# Patient Record
Sex: Female | Born: 1955 | Race: White | Hispanic: No | Marital: Married | Smoking: Never smoker
Health system: Southern US, Community
[De-identification: ages and names within clinical notes are randomized; demographics above are authoritative.]

## PROBLEM LIST (undated history)

## (undated) DIAGNOSIS — T7840XA Allergy, unspecified, initial encounter: Secondary | ICD-10-CM

## (undated) DIAGNOSIS — E785 Hyperlipidemia, unspecified: Secondary | ICD-10-CM

## (undated) DIAGNOSIS — R200 Anesthesia of skin: Secondary | ICD-10-CM

## (undated) DIAGNOSIS — Z9229 Personal history of other drug therapy: Secondary | ICD-10-CM

## (undated) DIAGNOSIS — I10 Essential (primary) hypertension: Secondary | ICD-10-CM

## (undated) DIAGNOSIS — E039 Hypothyroidism, unspecified: Secondary | ICD-10-CM

## (undated) DIAGNOSIS — J45909 Unspecified asthma, uncomplicated: Secondary | ICD-10-CM

## (undated) DIAGNOSIS — K219 Gastro-esophageal reflux disease without esophagitis: Secondary | ICD-10-CM

## (undated) DIAGNOSIS — U071 COVID-19: Secondary | ICD-10-CM

## (undated) DIAGNOSIS — R202 Paresthesia of skin: Secondary | ICD-10-CM

## (undated) DIAGNOSIS — L509 Urticaria, unspecified: Secondary | ICD-10-CM

## (undated) DIAGNOSIS — G43909 Migraine, unspecified, not intractable, without status migrainosus: Secondary | ICD-10-CM

## (undated) DIAGNOSIS — M1990 Unspecified osteoarthritis, unspecified site: Secondary | ICD-10-CM

## (undated) DIAGNOSIS — F32A Depression, unspecified: Secondary | ICD-10-CM

## (undated) DIAGNOSIS — Z87442 Personal history of urinary calculi: Secondary | ICD-10-CM

## (undated) HISTORY — DX: Unspecified osteoarthritis, unspecified site: M19.90

## (undated) HISTORY — PX: FRACTURE SURGERY: SHX138

## (undated) HISTORY — DX: Gastro-esophageal reflux disease without esophagitis: K21.9

## (undated) HISTORY — PX: TONSILLECTOMY AND ADENOIDECTOMY: SHX28

## (undated) HISTORY — DX: COVID-19: U07.1

## (undated) HISTORY — PX: ADENOIDECTOMY: SUR15

## (undated) HISTORY — DX: Essential (primary) hypertension: I10

## (undated) HISTORY — PX: TONSILLECTOMY: SUR1361

## (undated) HISTORY — DX: Urticaria, unspecified: L50.9

## (undated) HISTORY — DX: Depression, unspecified: F32.A

## (undated) HISTORY — PX: ARM NEUROPLASTY: SHX1184

## (undated) HISTORY — DX: Unspecified asthma, uncomplicated: J45.909

## (undated) HISTORY — DX: Hyperlipidemia, unspecified: E78.5

## (undated) HISTORY — DX: Allergy, unspecified, initial encounter: T78.40XA

---
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
## 1986-10-26 HISTORY — PX: TUBAL LIGATION: SHX77

## 2004-09-24 ENCOUNTER — Ambulatory Visit: Payer: Self-pay | Admitting: Otolaryngology

## 2004-12-23 ENCOUNTER — Ambulatory Visit: Payer: Self-pay | Admitting: Otolaryngology

## 2005-07-15 ENCOUNTER — Ambulatory Visit: Payer: Self-pay

## 2005-07-29 ENCOUNTER — Ambulatory Visit: Payer: Self-pay | Admitting: Unknown Physician Specialty

## 2006-07-28 ENCOUNTER — Ambulatory Visit: Payer: Self-pay

## 2006-07-30 ENCOUNTER — Ambulatory Visit: Payer: Self-pay

## 2006-08-27 ENCOUNTER — Encounter: Payer: Self-pay | Admitting: General Practice

## 2006-09-25 ENCOUNTER — Encounter: Payer: Self-pay | Admitting: General Practice

## 2006-10-26 ENCOUNTER — Encounter: Payer: Self-pay | Admitting: General Practice

## 2006-11-26 ENCOUNTER — Encounter: Payer: Self-pay | Admitting: General Practice

## 2007-06-09 ENCOUNTER — Ambulatory Visit: Payer: Self-pay

## 2007-09-14 ENCOUNTER — Ambulatory Visit: Payer: Self-pay | Admitting: Unknown Physician Specialty

## 2008-11-01 ENCOUNTER — Ambulatory Visit: Payer: Self-pay | Admitting: Unknown Physician Specialty

## 2009-08-19 ENCOUNTER — Other Ambulatory Visit: Payer: Self-pay

## 2009-09-12 ENCOUNTER — Other Ambulatory Visit: Payer: Self-pay

## 2009-10-29 ENCOUNTER — Other Ambulatory Visit: Payer: Self-pay

## 2009-11-04 ENCOUNTER — Ambulatory Visit: Payer: Self-pay | Admitting: Unknown Physician Specialty

## 2009-11-08 ENCOUNTER — Ambulatory Visit: Payer: Self-pay | Admitting: Unknown Physician Specialty

## 2009-12-10 ENCOUNTER — Ambulatory Visit: Payer: Self-pay

## 2009-12-17 ENCOUNTER — Other Ambulatory Visit: Payer: Self-pay

## 2010-07-16 ENCOUNTER — Ambulatory Visit: Payer: Self-pay | Admitting: Unknown Physician Specialty

## 2010-11-13 ENCOUNTER — Ambulatory Visit: Payer: Self-pay | Admitting: Unknown Physician Specialty

## 2011-01-06 ENCOUNTER — Other Ambulatory Visit: Payer: Self-pay | Admitting: Family Medicine

## 2011-04-20 ENCOUNTER — Other Ambulatory Visit: Payer: Self-pay | Admitting: Family Medicine

## 2011-09-01 ENCOUNTER — Other Ambulatory Visit: Payer: Self-pay | Admitting: Family Medicine

## 2011-11-29 ENCOUNTER — Emergency Department: Payer: Self-pay | Admitting: Emergency Medicine

## 2011-11-29 LAB — URINALYSIS, COMPLETE
Bacteria: NONE SEEN
Bilirubin,UR: NEGATIVE
Glucose,UR: NEGATIVE mg/dL (ref 0–75)
Ketone: NEGATIVE
Leukocyte Esterase: NEGATIVE
Nitrite: NEGATIVE
Ph: 5 (ref 4.5–8.0)
Protein: 30
RBC,UR: 27 /HPF (ref 0–5)
Specific Gravity: 1.029 (ref 1.003–1.030)
Squamous Epithelial: <1
WBC UR: 3 /HPF (ref 0–5)

## 2011-11-29 LAB — BASIC METABOLIC PANEL
Anion Gap: 6 — ABNORMAL LOW (ref 7–16)
BUN: 15 mg/dL (ref 7–18)
Calcium, Total: 8.4 mg/dL — ABNORMAL LOW (ref 8.5–10.1)
Chloride: 105 mmol/L (ref 98–107)
Co2: 28 mmol/L (ref 21–32)
Creatinine: 1.1 mg/dL (ref 0.60–1.30)
EGFR (African American): >60
EGFR (Non-African Amer.): 55 — ABNORMAL LOW
Glucose: 116 mg/dL — ABNORMAL HIGH (ref 65–99)
Osmolality: 279 (ref 275–301)
Potassium: 3.6 mmol/L (ref 3.5–5.1)
Sodium: 139 mmol/L (ref 136–145)

## 2011-11-29 LAB — CBC
HCT: 39.3 % (ref 35.0–47.0)
HGB: 13 g/dL (ref 12.0–16.0)
MCH: 30.4 pg (ref 26.0–34.0)
MCHC: 33.1 g/dL (ref 32.0–36.0)
MCV: 92 fL (ref 80–100)
Platelet: 177 10*3/uL (ref 150–440)
RBC: 4.27 10*6/uL (ref 3.80–5.20)
RDW: 13.2 % (ref 11.5–14.5)
WBC: 11.1 10*3/uL — ABNORMAL HIGH (ref 3.6–11.0)

## 2011-12-22 ENCOUNTER — Ambulatory Visit: Payer: Self-pay | Admitting: Urology

## 2012-02-04 LAB — HM PAP SMEAR: HM Pap smear: NEGATIVE

## 2012-06-10 ENCOUNTER — Ambulatory Visit: Payer: Self-pay | Admitting: Unknown Physician Specialty

## 2012-06-10 LAB — HM COLONOSCOPY

## 2012-06-15 LAB — PATHOLOGY REPORT

## 2012-06-17 ENCOUNTER — Other Ambulatory Visit: Payer: Self-pay | Admitting: Family Medicine

## 2012-10-20 ENCOUNTER — Ambulatory Visit: Payer: Self-pay | Admitting: Otolaryngology

## 2012-12-14 ENCOUNTER — Ambulatory Visit: Payer: Self-pay | Admitting: Family Medicine

## 2013-07-18 ENCOUNTER — Other Ambulatory Visit: Payer: Self-pay | Admitting: Family Medicine

## 2013-07-18 LAB — LIPID PANEL
Cholesterol: 225 mg/dL — ABNORMAL HIGH (ref 0–200)
Ldl Cholesterol, Calc: 156 mg/dL — ABNORMAL HIGH (ref 0–100)
VLDL Cholesterol, Calc: 20 mg/dL (ref 5–40)

## 2013-08-22 ENCOUNTER — Other Ambulatory Visit: Payer: Self-pay | Admitting: Family Medicine

## 2013-08-22 LAB — COMPREHENSIVE METABOLIC PANEL
Alkaline Phosphatase: 86 U/L (ref 50–136)
Anion Gap: 1 — ABNORMAL LOW (ref 7–16)
BUN: 14 mg/dL (ref 7–18)
Bilirubin,Total: 0.4 mg/dL (ref 0.2–1.0)
Calcium, Total: 9.6 mg/dL (ref 8.5–10.1)
Chloride: 107 mmol/L (ref 98–107)
Co2: 33 mmol/L — ABNORMAL HIGH (ref 21–32)
Creatinine: 0.96 mg/dL (ref 0.60–1.30)
EGFR (African American): >60
EGFR (Non-African Amer.): >60
Glucose: 95 mg/dL (ref 65–99)
Potassium: 4.2 mmol/L (ref 3.5–5.1)
SGOT(AST): 21 U/L (ref 15–37)
SGPT (ALT): 20 U/L (ref 12–78)
Sodium: 141 mmol/L (ref 136–145)
Total Protein: 6.9 g/dL (ref 6.4–8.2)

## 2013-08-22 LAB — LIPID PANEL
Triglycerides: 82 mg/dL (ref 0–200)
VLDL Cholesterol, Calc: 16 mg/dL (ref 5–40)

## 2013-08-22 LAB — CK: CK, Total: 68 U/L (ref 21–215)

## 2013-09-28 LAB — CBC AND DIFFERENTIAL
HCT: 43 % (ref 36–46)
Hemoglobin: 14 g/dL (ref 12.0–16.0)
Platelets: 219 10*3/uL (ref 150–399)
WBC: 7 10^3/mL

## 2013-10-18 ENCOUNTER — Emergency Department: Payer: Self-pay | Admitting: Internal Medicine

## 2013-11-23 LAB — TSH: TSH: 3.21 u[IU]/mL (ref <=5.90)

## 2013-11-24 ENCOUNTER — Ambulatory Visit: Payer: Self-pay | Admitting: Family Medicine

## 2013-11-24 LAB — HM MAMMOGRAPHY

## 2014-03-30 LAB — LIPID PANEL
CHOLESTEROL: 214 mg/dL — AB (ref 0–200)
HDL: 59 mg/dL (ref 35–70)
LDL Cholesterol: 140 mg/dL
Triglycerides: 74 mg/dL (ref 40–160)

## 2014-03-30 LAB — BASIC METABOLIC PANEL
BUN: 14 mg/dL (ref 4–21)
Creatinine: 0.9 mg/dL (ref 0.5–1.1)
Glucose: 83 mg/dL
POTASSIUM: 4 mmol/L (ref 3.4–5.3)
Sodium: 141 mmol/L (ref 137–147)

## 2014-03-30 LAB — HEMOGLOBIN A1C: Hgb A1c MFr Bld: 5.9 % (ref 4.0–6.0)

## 2014-03-30 LAB — HEPATIC FUNCTION PANEL
ALT: 13 U/L (ref 7–35)
AST: 16 U/L (ref 13–35)

## 2014-06-09 ENCOUNTER — Ambulatory Visit: Payer: Self-pay | Admitting: Family Medicine

## 2014-07-24 ENCOUNTER — Other Ambulatory Visit: Payer: Self-pay | Admitting: Family Medicine

## 2014-07-24 LAB — COMPREHENSIVE METABOLIC PANEL
Albumin: 3.5 g/dL (ref 3.4–5.0)
Alkaline Phosphatase: 68 U/L
Anion Gap: 4 — ABNORMAL LOW (ref 7–16)
BILIRUBIN TOTAL: 0.3 mg/dL (ref 0.2–1.0)
BUN: 15 mg/dL (ref 7–18)
CHLORIDE: 109 mmol/L — AB (ref 98–107)
CO2: 30 mmol/L (ref 21–32)
Calcium, Total: 9 mg/dL (ref 8.5–10.1)
Creatinine: 0.85 mg/dL (ref 0.60–1.30)
EGFR (Non-African Amer.): >60
GLUCOSE: 95 mg/dL (ref 65–99)
Osmolality: 286 (ref 275–301)
Potassium: 4.4 mmol/L (ref 3.5–5.1)
SGOT(AST): 21 U/L (ref 15–37)
SGPT (ALT): 22 U/L
Sodium: 143 mmol/L (ref 136–145)
Total Protein: 6.5 g/dL (ref 6.4–8.2)

## 2014-07-24 LAB — LIPID PANEL
CHOLESTEROL: 177 mg/dL (ref 0–200)
HDL: 46 mg/dL (ref 40–60)
LDL CHOLESTEROL, CALC: 115 mg/dL — AB (ref 0–100)
Triglycerides: 81 mg/dL (ref 0–200)
VLDL Cholesterol, Calc: 16 mg/dL (ref 5–40)

## 2014-07-24 LAB — CK: CK, Total: 88 U/L

## 2014-08-10 ENCOUNTER — Emergency Department: Payer: Self-pay | Admitting: Emergency Medicine

## 2014-08-10 LAB — URINALYSIS, COMPLETE
BILIRUBIN, UR: NEGATIVE
Bacteria: NONE SEEN
Glucose,UR: NEGATIVE mg/dL (ref 0–75)
Ketone: NEGATIVE
Leukocyte Esterase: NEGATIVE
NITRITE: NEGATIVE
PH: 5 (ref 4.5–8.0)
PROTEIN: NEGATIVE
SPECIFIC GRAVITY: 1.02 (ref 1.003–1.030)
Squamous Epithelial: NONE SEEN

## 2014-08-10 LAB — CBC WITH DIFFERENTIAL/PLATELET
Basophil #: 0.1 10*3/uL (ref 0.0–0.1)
Basophil %: 0.6 %
EOS PCT: 0.6 %
Eosinophil #: 0.1 10*3/uL (ref 0.0–0.7)
HCT: 43.6 % (ref 35.0–47.0)
HGB: 14.1 g/dL (ref 12.0–16.0)
LYMPHS ABS: 2.5 10*3/uL (ref 1.0–3.6)
LYMPHS PCT: 29.7 %
MCH: 29.6 pg (ref 26.0–34.0)
MCHC: 32.3 g/dL (ref 32.0–36.0)
MCV: 92 fL (ref 80–100)
MONO ABS: 0.6 x10 3/mm (ref 0.2–0.9)
Monocyte %: 6.7 %
NEUTROS PCT: 62.4 %
Neutrophil #: 5.3 10*3/uL (ref 1.4–6.5)
Platelet: 192 10*3/uL (ref 150–440)
RBC: 4.75 10*6/uL (ref 3.80–5.20)
RDW: 13.4 % (ref 11.5–14.5)
WBC: 8.5 10*3/uL (ref 3.6–11.0)

## 2014-08-10 LAB — COMPREHENSIVE METABOLIC PANEL
ALK PHOS: 72 U/L
ALT: 25 U/L
Albumin: 3.9 g/dL (ref 3.4–5.0)
Anion Gap: 2 — ABNORMAL LOW (ref 7–16)
BUN: 16 mg/dL (ref 7–18)
Bilirubin,Total: 0.4 mg/dL (ref 0.2–1.0)
CHLORIDE: 109 mmol/L — AB (ref 98–107)
CO2: 29 mmol/L (ref 21–32)
CREATININE: 0.93 mg/dL (ref 0.60–1.30)
Calcium, Total: 9.2 mg/dL (ref 8.5–10.1)
GLUCOSE: 112 mg/dL — AB (ref 65–99)
OSMOLALITY: 281 (ref 275–301)
Potassium: 3.8 mmol/L (ref 3.5–5.1)
SGOT(AST): 22 U/L (ref 15–37)
Sodium: 140 mmol/L (ref 136–145)
Total Protein: 7.2 g/dL (ref 6.4–8.2)

## 2014-08-23 ENCOUNTER — Emergency Department: Payer: Self-pay | Admitting: Emergency Medicine

## 2014-08-23 LAB — URINALYSIS, COMPLETE
BACTERIA: NONE SEEN
Bilirubin,UR: NEGATIVE
GLUCOSE, UR: NEGATIVE mg/dL (ref 0–75)
KETONE: NEGATIVE
Leukocyte Esterase: NEGATIVE
Nitrite: NEGATIVE
Ph: 5 (ref 4.5–8.0)
Protein: 100
SPECIFIC GRAVITY: 1.021 (ref 1.003–1.030)
Squamous Epithelial: NONE SEEN
WBC UR: 12 /HPF (ref 0–5)

## 2014-08-23 LAB — CBC
HCT: 45.1 % (ref 35.0–47.0)
HGB: 14.4 g/dL (ref 12.0–16.0)
MCH: 29.7 pg (ref 26.0–34.0)
MCHC: 31.9 g/dL — ABNORMAL LOW (ref 32.0–36.0)
MCV: 93 fL (ref 80–100)
PLATELETS: 204 10*3/uL (ref 150–440)
RBC: 4.85 10*6/uL (ref 3.80–5.20)
RDW: 13.1 % (ref 11.5–14.5)
WBC: 6.4 10*3/uL (ref 3.6–11.0)

## 2014-08-23 LAB — COMPREHENSIVE METABOLIC PANEL
ALBUMIN: 4.1 g/dL (ref 3.4–5.0)
Alkaline Phosphatase: 75 U/L
Anion Gap: 3 — ABNORMAL LOW (ref 7–16)
BUN: 15 mg/dL (ref 7–18)
Bilirubin,Total: 0.2 mg/dL (ref 0.2–1.0)
CALCIUM: 9.6 mg/dL (ref 8.5–10.1)
CHLORIDE: 106 mmol/L (ref 98–107)
CO2: 33 mmol/L — AB (ref 21–32)
Creatinine: 0.94 mg/dL (ref 0.60–1.30)
EGFR (African American): >60
EGFR (Non-African Amer.): >60
GLUCOSE: 98 mg/dL (ref 65–99)
Osmolality: 284 (ref 275–301)
POTASSIUM: 3.6 mmol/L (ref 3.5–5.1)
SGOT(AST): 19 U/L (ref 15–37)
SGPT (ALT): 26 U/L
Sodium: 142 mmol/L (ref 136–145)
TOTAL PROTEIN: 7.6 g/dL (ref 6.4–8.2)

## 2014-08-29 ENCOUNTER — Ambulatory Visit: Payer: Self-pay | Admitting: Urology

## 2014-08-29 LAB — URINALYSIS, COMPLETE
BILIRUBIN, UR: NEGATIVE
Bacteria: NONE SEEN
Glucose,UR: NEGATIVE mg/dL (ref 0–75)
KETONE: NEGATIVE
LEUKOCYTE ESTERASE: NEGATIVE
Nitrite: NEGATIVE
PH: 5 (ref 4.5–8.0)
Protein: 30
RBC,UR: 824 /HPF (ref 0–5)
SPECIFIC GRAVITY: 1.023 (ref 1.003–1.030)
Squamous Epithelial: <1
WBC UR: 4 /HPF (ref 0–5)

## 2014-08-29 LAB — COMPREHENSIVE METABOLIC PANEL
Albumin: 3.9 g/dL (ref 3.4–5.0)
Alkaline Phosphatase: 69 U/L
Anion Gap: 3 — ABNORMAL LOW (ref 7–16)
BUN: 17 mg/dL (ref 7–18)
Bilirubin,Total: 0.3 mg/dL (ref 0.2–1.0)
Calcium, Total: 9.3 mg/dL (ref 8.5–10.1)
Chloride: 103 mmol/L (ref 98–107)
Co2: 33 mmol/L — ABNORMAL HIGH (ref 21–32)
Creatinine: 0.98 mg/dL (ref 0.60–1.30)
EGFR (African American): >60
EGFR (Non-African Amer.): >60
Glucose: 101 mg/dL — ABNORMAL HIGH (ref 65–99)
Osmolality: 279 (ref 275–301)
Potassium: 4.3 mmol/L (ref 3.5–5.1)
SGOT(AST): 19 U/L (ref 15–37)
SGPT (ALT): 23 U/L
Sodium: 139 mmol/L (ref 136–145)
Total Protein: 7.1 g/dL (ref 6.4–8.2)

## 2014-08-29 LAB — CBC
HCT: 41.8 % (ref 35.0–47.0)
HGB: 13.4 g/dL (ref 12.0–16.0)
MCH: 29.8 pg (ref 26.0–34.0)
MCHC: 32.1 g/dL (ref 32.0–36.0)
MCV: 93 fL (ref 80–100)
Platelet: 189 10*3/uL (ref 150–440)
RBC: 4.51 10*6/uL (ref 3.80–5.20)
RDW: 13.4 % (ref 11.5–14.5)
WBC: 10.1 10*3/uL (ref 3.6–11.0)

## 2014-08-31 ENCOUNTER — Ambulatory Visit: Payer: Self-pay | Admitting: Urology

## 2014-09-11 ENCOUNTER — Ambulatory Visit: Payer: Self-pay | Admitting: Urology

## 2014-09-19 ENCOUNTER — Ambulatory Visit: Payer: Self-pay | Admitting: Urology

## 2014-10-05 ENCOUNTER — Ambulatory Visit: Payer: Self-pay | Admitting: Urology

## 2015-01-29 ENCOUNTER — Encounter: Payer: Self-pay | Admitting: *Deleted

## 2015-02-16 NOTE — Op Note (Signed)
PATIENT NAME:  Rachel Fry, Rachel Fry MR#:  [id number] DATE OF BIRTH:  1956-03-11  DATE OF PROCEDURE:  09/19/2014  PREOPERATIVE DIAGNOSIS: Right distal ureteral stone.   POSTOPERATIVE DIAGNOSIS: Right distal ureteral stone.   PROCEDURE PERFORMED: Right ureteroscopy, laser lithotripsy with right ureteral stent exchange.   ATTENDING SURGEON:  Sherlynn Stalls, MD  SPECIMENS: Stone fragment.   DRAINS: A 6 x 24 French double-J ureteral stent on string.   COMPLICATIONS: None.   ESTIMATED BLOOD LOSS: Minimal.   INDICATION: This is a 59 year old female with a history of a 6 mm distal right ureteral stone, who presented to the Emergency Room with severe pain. She was taken for an emergent ureteral stent approximately 2 weeks ago and returns today for definitive management of her stone. Risks and benefits of the procedure were explained in detail. The patient agreed to proceed as planned.   PROCEDURE: The patient was correctly identified in the preoperative holding area and informed consent was obtained. She was brought to the operating suite and placed on the table in the supine position. At this time universal timeout protocol was performed. All team members were identified. Venodyne boots were placed and she was administered 500 mg of IV Levaquin. She was then placed under general anesthesia, repositioned lower in the bed in the dorsal lithotomy position, and prepped and draped in a standard surgical fashion. At this point in time, a rigid cystoscope was introduced per urethra into the bladder and the distal coil of an indwelling ureteral stent was grasped and brought to the level of the urethral meatus. A Sensor wire was then placed through the distal coil to the level of the kidney under fluoroscopic guidance. The stent was removed and the wire was snapped in place as a safety wire. A rigid ureteroscope was then used and advanced up to the level of the stone which was seen with a small amount of  surrounding edema. A 365 micron laser fiber was then used, using the settings of 0.8 joules and 10 Hz to fragment the stone into approximately 3-4 pieces. The pieces were then basketed out using a 3 French tipless basket until there were no remaining stone fragments in the entire ureter. The scope was able to be advanced all the way up to the UPJ and no additional stone fragments were identified. There was minimal ureteral trauma noted at this point in time. The scope was removed and a 6 x 24 French double-J ureteral stent was advanced over the wire to the level of the renal pelvis. The wire was then partially withdrawn. A coil was noted within the renal pelvis. The wire was then fully withdrawn and a coil was noted within the bladder. The bladder was drained using the scope sheath and the patient was cleaned and dried. The stent string was fixed to the patient's right inner thigh using Mastisol and a Tegaderm. She was then reversed from anesthesia and taken to the PACU in stable condition. There were no complications in this case.    ____________________________ Sherlynn Stalls, MD ajb:LT D: 09/19/2014 14:07:40 ET T: 09/19/2014 18:03:15 ET JOB#: [id number]  cc: Sherlynn Stalls, MD, <Dictator> Sherlynn Stalls MD ELECTRONICALLY SIGNED 10/10/2014 10:51

## 2015-02-16 NOTE — Op Note (Signed)
PATIENT NAME:  Rachel Fry, DIENER MR#:  [id number] DATE OF BIRTH:  1955/11/12  DATE OF PROCEDURE:  08/29/2014  PREOPERATIVE DIAGNOSIS:  Right ureteral stone, uncontrolled flank pain.   POSTOPERATIVE DIAGNOSIS: Right ureteral stone, uncontrolled flank pain.   PROCEDURE PERFORMED:  1.  Cystoscopy. 2.  Right ureteral stent placement.   ATTENDING SURGEON:  Sherlynn Stalls, MD  ANESTHESIA: General.   ESTIMATED BLOOD LOSS: Minimal.   DRAINS: A 6 x 24 French double-J ureteral stent on right.   SPECIMENS: None.   COMPLICATIONS: None.   INDICATION: This is a 59 year old woman who presented to the Emergency Room with a 6 mm right mid distal ureteral stone with uncontrolled flank pain. She presented to the Emergency Room approximately 2 other times in the same week with the same pain. She had no evidence of leukocytosis or fevers with an unremarkable UA.  She was counseled on her various treatment options and has elected to undergo right ureteral stent placement today for management of her pain with definitive management of her stone at a later date.  Risks and benefits of the procedure were explained in detail. The patient agreed to proceed as planned.   PROCEDURE: The patient was correctly identified in the preoperative holding area and informed consent was obtained. She was brought to the operating suite and placed on the table in the supine position. At this time, universal timeout protocol were performed. All team members were identified. Venodyne boots were placed, and she was administered 2 grams IV Ancef in the perioperative period. She was then placed under general anesthesia, repositioned on the bed at the lower end on the bed in the dorsal lithotomy position and prepped and draped in standard surgical fashion. At this point in time, a rigid cystoscope was advanced per urethra into the bladder and attention was turned to the right ureteral orifice. This was cannulated using a 5 [place]  open-ended ureteral catheter and Sensor wire was advanced up to the level of the kidney. Of note, a stone shadow could be seen in the distal ureter consistent with the stone on CT scan. A 6 x 24 French double-J ureteral stent was then advanced over the wire to the level of the renal pelvis. The wire was partially withdrawn. A coil was noted within the renal pelvis. The wire was then fully withdrawn, and an adequate coil was noted within the bladder as well. The bladder was then drained. The patient was reversed from anesthesia and taken to the PACU in stable condition. There were no complications.    ____________________________ Sherlynn Stalls, MD ajb:LT D: 08/29/2014 18:09:22 ET T: 08/29/2014 19:07:10 ET JOB#: [id number]  cc: Sherlynn Stalls, MD, <Dictator> Sherlynn Stalls MD ELECTRONICALLY SIGNED 09/13/2014 15:18

## 2015-02-16 NOTE — H&P (Signed)
Subjective/Chief Complaint right flank pain   History of Present Illness 59 yo F with 5 mm right mid/distal ureteral stone with recurrent severe right flank pain, nausea.  She has been having pain since September and was evaluated by Dr. Broadus John in [place].  She elected to try to pass the stone, however, has been unable.  She has been in the ED x 3 over the past week with poorly controlled pain.    No fevers or chills.  No vomiting.  No dysuria or UTI symptoms.  + gross hematuria.   Past History Fibromyalgia History of kidney stones Asthma GERD  PSH: ovarian surgery c-section   Code Status Full Code   Past Med/Surgical Hx:  Kidney Stones:   anxiety:   migraine:   GERD:   asthma:   ALLERGIES:  Latex: Wheezing  Codeine: Unknown  Sulfa drugs: Unknown  Novocain: Unknown  HOME MEDICATIONS: Medication Instructions Status  Flomax 0.4 mg oral capsule 1 cap(s) orally once a day for 7 days Active  Nasacort 55 mcg/inh nasal spray 2 spray(s) nasal once a day (in the evening) Active  ZyrTEC 10 mg oral tablet 1 tab(s) orally once a day Active  Cymbalta 60 mg oral delayed release capsule 1 cap(s) orally once a day Active  atorvastatin 20 mg oral tablet 1 tab(s) orally once a day (at bedtime) Active  Flovent HFA CFC free 110 mcg/inh inhalation aerosol 2 puff(s) inhaled 2 times a day Active  Vitamin D2 50,000 intl units oral capsule 1 cap(s) orally once a month Active  Prevacid 30 mg oral delayed release capsule 1 cap(s) orally once a day Active  Accolate 10 mg oral tablet 1 tab(s) orally 2 times a day Active   Family and Social History:  Family History Non-Contributory   Social History works as Copywriter, advertising of [employer]   Review of Systems:  Fever/Chills No   Cough No   Sputum No   Abdominal Pain Yes   Diarrhea No   Constipation Yes   Nausea/Vomiting Yes   SOB/DOE No   Chest Pain No   Telemetry Reviewed NSR   Dysuria No   Tolerating Diet  Yes   Medications/Allergies Reviewed Medications/Allergies reviewed   Physical Exam:  GEN well developed, well nourished, no acute distress   NECK No masses   RESP normal resp effort  clear BS   CARD regular rate  No LE edema   ABD positive Flank Tenderness  soft  normal BS  Right flank tenderness   GU no superpubic tenderness   LYMPH negative neck   EXTR negative cyanosis/clubbing   SKIN normal to palpation, No rashes, No ulcers, skin turgor good   NEURO cranial nerves intact   PSYCH alert, A+O to time, place, person, good insight   Additional Comments Vitals 99.7, P78   Lab Results:  Hepatic:  04-Nov-15 11:12   Bilirubin, Total 0.3  Alkaline Phosphatase 69 (46-116 NOTE: New Reference Range 05/15/14)  SGPT (ALT) 23 (14-63 NOTE: New Reference Range 05/15/14)  SGOT (AST) 19  Total Protein, Serum 7.1  Albumin, Serum 3.9  Routine Chem:  04-Nov-15 11:12   Glucose, Serum  101  BUN 17  Creatinine (comp) 0.98  Sodium, Serum 139  Potassium, Serum 4.3  Chloride, Serum 103  CO2, Serum  33  Calcium (Total), Serum 9.3  Osmolality (calc) 279  eGFR (African American) >60  eGFR (Non-African American) >60 (eGFR values <45m/min/1.73 m2 may be an indication of chronic kidney  disease (CKD). Calculated eGFR, using the MRDR Study equation, is useful in  patients with stable renal function. The eGFR calculation will not be reliable in acutely ill patients when serum creatinine is changing rapidly. It is not useful in patients on dialysis. The eGFR calculation may not be applicable to patients at the low and high extremes of body sizes, pregnant women, and vegetarians.)  Anion Gap  3  Routine UA:  04-Nov-15 11:12   Color (UA) Yellow  Clarity (UA) Hazy  Glucose (UA) Negative  Bilirubin (UA) Negative  Ketones (UA) Negative  Specific Gravity (UA) 1.023  Blood (UA) 3+  pH (UA) 5.0  Protein (UA) 30 mg/dL  Nitrite (UA) Negative  Leukocyte Esterase (UA) Negative  (Result(s) reported on 29 Aug 2014 at 11:50AM.)  RBC (UA) 824 /HPF  WBC (UA) 4 /HPF  Bacteria (UA) NONE SEEN  Epithelial Cells (UA) <1 /HPF  Mucous (UA) PRESENT  Budding Yeast (UA) PRESENT (Result(s) reported on 29 Aug 2014 at 11:50AM.)  Routine Hem:  04-Nov-15 11:12   WBC (CBC) 10.1  RBC (CBC) 4.51  Hemoglobin (CBC) 13.4  Hematocrit (CBC) 41.8  Platelet Count (CBC) 189 (Result(s) reported on 29 Aug 2014 at 11:38AM.)  MCV 93  MCH 29.8  MCHC 32.1  RDW 13.4   Radiology Results: LabUnknown:    04-Nov-15 11:17, CT Abdomen Pelvis WO for Stone  PACS Image  CT:  CT Abdomen Pelvis WO for Stone  REASON FOR EXAM:    severe left flank pain  COMMENTS:       PROCEDURE: CT  - CT ABDOMEN /PELVIS WO (STONE)  - Aug 29 2014 11:17AM     CLINICAL DATA:  59 year old female with acute right flank pain  radiating to the groin. Intermittent microscopic hematuria. Personal  history of kidney stones. Initial encounter.    EXAM:  CT ABDOMEN AND PELVIS WITHOUT CONTRAST    TECHNIQUE:  Multidetector CT imaging of the abdomen and pelvis was performed  following the standard protocol without IV contrast.  COMPARISON:  CT Abdomen and Pelvis 11/29/2011. Renal ultrasound  08/10/2014.    FINDINGS:  Negative lung bases.  No pericardial or pleural effusion.    No acute osseous abnormality identified.    No pelvic free fluid. Negative non contrast uterus and adnexa. Stool  ball in the rectum and retained stool in the distal colon which is  redundant, otherwise negative distal colon. Left colon is  decompressed. Retained stool at the a flexures and redundant  transversecolon. Negative right colon and appendix. Negative  terminal ileum. No dilated small bowel. Decompressed stomach and  duodenum.  Negative non contrast liver, Stable subcentimeter hypodensity at the  anterior right lobe. Negative non contrast gallbladder, spleen,  pancreas, and adrenal glands. No abdominal free fluid.    Left renal  collecting system and ureter now decompressed. No left  nephrolithiasis. Negative course of the left ureter. Chronic pelvic  phleboliths.    Mild to moderate right hydronephrosis with perinephric and  periureteral stranding. Right ureter dilated into the pelvis. There  is a oval 5 x 6 mm calculus located about 4 cm proximal to the right  ureterovesical junction. The bladder is decompressed. Other right  hemipelvis phleboliths are stable. No other right nephrolithiasis.    Minimal Aortoiliac calcified atherosclerosis noted.   IMPRESSION:  Acute obstructive uropathy on the right with a 5 x 6 mm calculus in  the pelvis about 4 cm proximal to the right UVJ. Right  hydronephrosis Stable or increased  compared to that on 08/10/2014.      Electronically Signed    By: Lars Pinks M.D.    On: 08/29/2014 11:52         Verified By: Gwenyth Bender. HALL, M.D.,    Assessment/Admission Diagnosis 59 yo F with 5 mm mid/ distal obstructing ureteral stone with poorly controlled pain.  No evidence of infection, HD stable with no leukocytosis or evidence of infection on UA.  Discussed proceding with ureterals stent (right) to releive obstruction and return at later date for definative manamgent of her stone.  Risks of surgery inlcuding risk of bleeding, infection, bladder irritatoin of stent, ureteral damage discussed.  She will ultimately require further treatment for her stone.   Plan -NPO for right ureteral stent placement -likely discharge from PACU with outpatient follow up Friday -ancef 2 g on call to OR   Electronic Signatures: Sherlynn Stalls (MD)  (Signed [phone] 17:04)  Authored: CHIEF COMPLAINT and HISTORY, PAST MEDICAL/SURGIAL HISTORY, ALLERGIES, HOME MEDICATIONS, FAMILY AND SOCIAL HISTORY, REVIEW OF SYSTEMS, PHYSICAL EXAM, LABS, Radiology, ASSESSMENT AND PLAN   Last Updated: 04-Nov-15 17:04 by Sherlynn Stalls (MD)

## 2015-02-25 DIAGNOSIS — F329 Major depressive disorder, single episode, unspecified: Secondary | ICD-10-CM | POA: Insufficient documentation

## 2015-02-25 DIAGNOSIS — F32A Depression, unspecified: Secondary | ICD-10-CM | POA: Insufficient documentation

## 2015-02-25 DIAGNOSIS — M797 Fibromyalgia: Secondary | ICD-10-CM | POA: Insufficient documentation

## 2015-02-25 DIAGNOSIS — G4710 Hypersomnia, unspecified: Secondary | ICD-10-CM | POA: Insufficient documentation

## 2015-02-25 DIAGNOSIS — J45909 Unspecified asthma, uncomplicated: Secondary | ICD-10-CM | POA: Insufficient documentation

## 2015-02-25 DIAGNOSIS — R001 Bradycardia, unspecified: Secondary | ICD-10-CM | POA: Insufficient documentation

## 2015-02-25 DIAGNOSIS — R739 Hyperglycemia, unspecified: Secondary | ICD-10-CM | POA: Insufficient documentation

## 2015-02-25 DIAGNOSIS — E559 Vitamin D deficiency, unspecified: Secondary | ICD-10-CM | POA: Insufficient documentation

## 2015-02-25 DIAGNOSIS — K219 Gastro-esophageal reflux disease without esophagitis: Secondary | ICD-10-CM | POA: Insufficient documentation

## 2015-02-25 DIAGNOSIS — M791 Myalgia, unspecified site: Secondary | ICD-10-CM | POA: Insufficient documentation

## 2015-02-25 DIAGNOSIS — B178 Other specified acute viral hepatitis: Secondary | ICD-10-CM | POA: Insufficient documentation

## 2015-02-25 DIAGNOSIS — J302 Other seasonal allergic rhinitis: Secondary | ICD-10-CM | POA: Insufficient documentation

## 2015-02-25 DIAGNOSIS — F419 Anxiety disorder, unspecified: Secondary | ICD-10-CM | POA: Insufficient documentation

## 2015-02-25 DIAGNOSIS — G43909 Migraine, unspecified, not intractable, without status migrainosus: Secondary | ICD-10-CM | POA: Insufficient documentation

## 2015-02-25 DIAGNOSIS — Z87898 Personal history of other specified conditions: Secondary | ICD-10-CM | POA: Insufficient documentation

## 2015-02-25 DIAGNOSIS — F430 Acute stress reaction: Secondary | ICD-10-CM | POA: Insufficient documentation

## 2015-02-25 DIAGNOSIS — E7800 Pure hypercholesterolemia, unspecified: Secondary | ICD-10-CM | POA: Insufficient documentation

## 2015-03-28 ENCOUNTER — Ambulatory Visit (INDEPENDENT_AMBULATORY_CARE_PROVIDER_SITE_OTHER): Payer: 59 | Admitting: Family Medicine

## 2015-03-28 ENCOUNTER — Encounter: Payer: Self-pay | Admitting: Family Medicine

## 2015-03-28 VITALS — BP 110/70 | HR 78 | Temp 98.1°F | Resp 16 | Ht 66.0 in | Wt 145.0 lb

## 2015-03-28 DIAGNOSIS — E7800 Pure hypercholesterolemia, unspecified: Secondary | ICD-10-CM

## 2015-03-28 DIAGNOSIS — R7309 Other abnormal glucose: Secondary | ICD-10-CM

## 2015-03-28 DIAGNOSIS — Z0000 Encounter for general adult medical examination without abnormal findings: Secondary | ICD-10-CM | POA: Diagnosis not present

## 2015-03-28 DIAGNOSIS — E559 Vitamin D deficiency, unspecified: Secondary | ICD-10-CM | POA: Diagnosis not present

## 2015-03-28 DIAGNOSIS — M797 Fibromyalgia: Secondary | ICD-10-CM

## 2015-03-28 DIAGNOSIS — R739 Hyperglycemia, unspecified: Secondary | ICD-10-CM

## 2015-03-28 LAB — POCT URINALYSIS DIPSTICK
BILIRUBIN UA: NEGATIVE
Blood, UA: NEGATIVE
Color, UA: NEGATIVE
Glucose, UA: NEGATIVE
Ketones, UA: NEGATIVE
Leukocytes, UA: NEGATIVE
Nitrite, UA: NEGATIVE
PH UA: 5
Protein, UA: NEGATIVE
SPEC GRAV UA: 1.025
Urobilinogen, UA: 0.2

## 2015-03-28 MED ORDER — DULOXETINE HCL 30 MG PO CPEP: 30.0000 mg | ORAL_CAPSULE | Freq: Every day | ORAL | Status: DC

## 2015-03-28 NOTE — Progress Notes (Signed)
Patient ID: [name], female   DOB: [birth year], 59 y.o.   MRN: [id number] Patient: Rachel Fry, Female    DOB: 02-Jan-1956, 59 y.o.   MRN: [id number] Visit Date: 03/28/2015  Today's Provider: Margarita Rana, MD   Chief Complaint  Patient presents with  . Annual Exam   Subjective:  Rachel Fry is a 59 y.o. female who presents today for health maintenance and complete physical. She feels well. She reports exercising 2 days a week. She reports she is sleeping well.   Review of Systems  Constitutional: Positive for activity change.  HENT: Positive for congestion (runny nose ).   Eyes: Positive for photophobia, itching and visual disturbance (migraines).  Respiratory: Negative.   Cardiovascular: Negative.   Gastrointestinal: Negative.   Endocrine: Negative.   Genitourinary: Negative.   Musculoskeletal: Positive for myalgias, back pain and arthralgias.  Skin: Negative.   Allergic/Immunologic: Positive for environmental allergies and food allergies.  Neurological: Positive for numbness (chronic) and headaches.  Hematological: Bruises/bleeds easily.  Psychiatric/Behavioral: Positive for decreased concentration.    History   Social History  . Marital Status: Married    Spouse Name: N/A  . Number of Children: N/A  . Years of Education: N/A   Occupational History  . Not on file.   Social History Main Topics  . Smoking status: Never Smoker   . Smokeless tobacco: Never Used  . Alcohol Use: No  . Drug Use: No  . Sexual Activity: Not on file   Other Topics Concern  . Not on file   Social History Narrative    Past Medical History  Diagnosis Date  . Allergy     Past Surgical History  Procedure Laterality Date  . Tubal ligation  1988  . Cesarean section    . Tonsillectomy and adenoidectomy    . Arm neuroplasty Right 1994 and 1996    for chronic pain  . Ovarian cyst removal  1973    Her family history includes Birth defects in her brother; Breast cancer in her  mother; Cancer in her brother; Cardiomyopathy in her father; Colon cancer in her maternal grandmother; Congenital heart disease in her brother; Dementia in her mother; Hyperlipidemia in her brother and brother; Hypertension in her brother, brother, brother, and mother; Peripheral Artery Disease in her father; Stroke in her mother and paternal grandfather; Thyroid disease in her brother and mother.    Previous Medications   ATORVASTATIN (LIPITOR) 20 MG TABLET    Take by mouth.   BOTULINUM TOXIN TYPE A (BOTOX) 100 UNITS SOLR INJECTION    Botox - Historical Medication  once every 3 months for migraines Active   CETIRIZINE (ZYRTEC) 10 MG TABLET    Take by mouth.   DULOXETINE (CYMBALTA) 60 MG CAPSULE    Take by mouth.   FLUTICASONE (FLOVENT HFA) 110 MCG/ACT INHALER    Inhale into the lungs.   FROVATRIPTAN (FROVA) 2.5 MG TABLET    Take by mouth.   IBUPROFEN (ADVIL,MOTRIN) 200 MG TABLET    Take by mouth.   LANSOPRAZOLE (PREVACID) 15 MG CAPSULE    Take by mouth.   TRIAMCINOLONE ACETONIDE, NASAL STEROIDS,    Place into the nose.   VITAMIN D, ERGOCALCIFEROL, (DRISDOL) 50000 UNITS CAPS CAPSULE    Take by mouth.   ZAFIRLUKAST (ACCOLATE) 10 MG TABLET    Take by mouth.    Patient Care Team: Margarita Rana, MD as PCP - General (Family Medicine) Patient Active Problem List   Diagnosis Date Noted  .  Acute stress disorder 02/25/2015  . Anxiety 02/25/2015  . Airway hyperreactivity 02/25/2015  . Bradycardia 02/25/2015  . Hypersomnia 02/25/2015  . Clinical depression 02/25/2015  . Elevated blood sugar 02/25/2015  . 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 Acid reflux 02/25/2015  . 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 H/O disease 02/25/2015  . Hypercholesteremia 02/25/2015  . Headache, migraine 02/25/2015  . Muscle ache 02/25/2015  . Allergic rhinitis, seasonal 02/25/2015  . Avitaminosis D 02/25/2015      Objective:   Vitals:  Filed Vitals:   03/28/15 1431  BP: 110/70  Pulse: 78  Temp: 98.1 F (36.7 C)   TempSrc: Oral  Resp: 16  Height: 5\' 6"  (1.676 m)  Weight: 145 lb (65.772 kg)  SpO2: 97%    Physical Exam  Constitutional: She is oriented to person, place, and time. She appears well-developed and well-nourished.  HENT:  Head: Normocephalic and atraumatic.  Right Ear: External ear normal.  Left Ear: External ear normal.  Nose: Nose normal.  Mouth/Throat: Oropharynx is clear and moist.  Eyes: Conjunctivae and EOM are normal. Pupils are equal, round, and reactive to light. Right eye exhibits no discharge.  Neck: Normal range of motion. Neck supple. No tracheal deviation present. No thyromegaly present.  Cardiovascular: Normal rate, regular rhythm, normal heart sounds and intact distal pulses.   No murmur heard. Pulmonary/Chest: Effort normal and breath sounds normal. No respiratory distress. She has no wheezes. She has no rales. She exhibits no tenderness. Right breast exhibits no inverted nipple, no mass and no tenderness. Left breast exhibits no inverted nipple, no mass and no tenderness. Breasts are symmetrical.  Abdominal: Soft. She exhibits no distension and no mass. There is no tenderness. There is no rebound and no guarding.  Musculoskeletal: Normal range of motion. She exhibits no edema or tenderness.  Lymphadenopathy:    She has no cervical adenopathy.  Neurological: She is alert and oriented to person, place, and time. She has normal reflexes. No cranial nerve deficit. She exhibits normal muscle tone. Coordination normal.  Skin: Skin is warm and dry. No rash noted. No erythema.  Psychiatric: She has a normal mood and affect. Her behavior is normal. Judgment and thought content normal.     Depression Screen No flowsheet data found.    Assessment & Plan:     Routine Health Maintenance and Physical Exam  1. Annual physical exam    Healthy female. Did have HIV test previously. Other screenings order. Continue with neurology for migraines.  Also with fibromyalgia.  Will  taper Cymbalta per patient request.  Will take 60/30 alternating for 2 weeks then 30 for 2 weeks and decide on further taper from there.   - POCT urinalysis dipstick - MM DIGITAL SCREENING BILATERAL; Future  2. Hypercholesteremia Lipids checked by neuro.   - COMPLETE METABOLIC PANEL WITH GFR - TSH  3. Avitaminosis D  - Vitamin D (25 hydroxy)  4. Elevated blood sugar - HgB A1c  Exercise Activities and Dietary recommendations Goals    None      Immunization History  Administered Date(s) Administered  . Tdap 10/11/2009    Health Maintenance  Topic Date Due  . HIV Screening  06/21/1970  . PAP SMEAR  02/04/2015  . INFLUENZA VACCINE  05/27/2015  . MAMMOGRAM  11/25/2015  . TETANUS/TDAP  10/12/2019  . COLONOSCOPY  06/10/2022      Discussed health benefits of physical activity, and encouraged her to engage in regular exercise appropriate for her age and condition.    ------------------------------------------------------------------------------------------------------------

## 2015-04-04 ENCOUNTER — Encounter: Payer: Self-pay | Admitting: Family Medicine

## 2015-04-04 ENCOUNTER — Ambulatory Visit (INDEPENDENT_AMBULATORY_CARE_PROVIDER_SITE_OTHER): Payer: 59 | Admitting: Family Medicine

## 2015-04-04 VITALS — BP 138/92 | HR 76 | Temp 98.1°F | Resp 16 | Wt 145.0 lb

## 2015-04-04 DIAGNOSIS — R42 Dizziness and giddiness: Secondary | ICD-10-CM | POA: Diagnosis not present

## 2015-04-04 DIAGNOSIS — R270 Ataxia, unspecified: Secondary | ICD-10-CM

## 2015-04-04 NOTE — Progress Notes (Signed)
Subjective:     Patient ID: Rachel Fry, female   DOB: [birth year], 59 y.o.   MRN: [id number]  HPI  Chief Complaint  Patient presents with  . Dizziness    "I can't walk straight" when she got up this AM  Reports hx of migraines and prior trouble with balance and right ear problems. States this is different for her. Denies cognitive changes or trouble swallowing. Feels the right side of her face feels "heavier". Reports participating in a 5 K run on 6/5.   Review of Systems  HENT: Negative for congestion (allergies under control with medication).   Psychiatric/Behavioral: The patient is not nervous/anxious (feels under control with medication.).        Objective:   Physical Exam  Constitutional: She appears well-developed and well-nourished.  HENT:  Right Ear: Tympanic membrane normal.  Left Ear: Tympanic membrane normal.  Eyes: EOM are normal. Pupils are equal, round, and reactive to light.  Neck: Carotid bruit is not present.  Cardiovascular: Normal rate and regular rhythm.   Pulmonary/Chest: Breath sounds normal.  Neurological: She exhibits normal muscle tone (no facial droop  ). She displays a negative Romberg sign. Gait (has trouble maintaining heel to toe. ) abnormal.  Finger to Nose WNL       Assessment:     1. Dizziness  - Renal Function Panel - CBC with Differential - CT Head Wo Contrast; Future  2. Ataxia  - CT Head Wo Contrast; Future    Plan:     Consider referral to ENT [place] [place]) CT/labs normal

## 2015-04-04 NOTE — Patient Instructions (Signed)
Consider ENT evaluation pending labs and scan report

## 2015-04-05 ENCOUNTER — Telehealth: Payer: Self-pay | Admitting: Family Medicine

## 2015-04-05 ENCOUNTER — Ambulatory Visit
Admission: RE | Admit: 2015-04-05 | Discharge: 2015-04-05 | Disposition: A | Discharge status: Home / Self Care | Payer: 59 | Source: Ambulatory Visit | Attending: Family Medicine | Admitting: Family Medicine

## 2015-04-05 DIAGNOSIS — R42 Dizziness and giddiness: Secondary | ICD-10-CM | POA: Diagnosis not present

## 2015-04-05 DIAGNOSIS — R270 Ataxia, unspecified: Secondary | ICD-10-CM | POA: Insufficient documentation

## 2015-04-05 LAB — RENAL FUNCTION PANEL
Albumin: 4.1 g/dL (ref 3.5–5.5)
BUN / CREAT RATIO: 19 (ref 9–23)
BUN: 16 mg/dL (ref 6–24)
CALCIUM: 10 mg/dL (ref 8.7–10.2)
CO2: 30 mmol/L — ABNORMAL HIGH (ref 18–29)
Chloride: 102 mmol/L (ref 97–108)
Creatinine, Ser: 0.83 mg/dL (ref 0.57–1.00)
GFR calc Af Amer: 89 mL/min/{1.73_m2} (ref >59)
GFR calc non Af Amer: 77 mL/min/{1.73_m2} (ref >59)
Glucose: 100 mg/dL — ABNORMAL HIGH (ref 65–99)
Phosphorus: 3.3 mg/dL (ref 2.5–4.5)
Potassium: 4.7 mmol/L (ref 3.5–5.2)
Sodium: 142 mmol/L (ref 134–144)

## 2015-04-05 LAB — CBC WITH DIFFERENTIAL/PLATELET
BASOS ABS: 0 10*3/uL (ref 0.0–0.2)
BASOS: 0 %
EOS (ABSOLUTE): 0 10*3/uL (ref 0.0–0.4)
Eos: 1 %
HEMOGLOBIN: 14.2 g/dL (ref 11.1–15.9)
Hematocrit: 44.5 % (ref 34.0–46.6)
Immature Grans (Abs): 0 10*3/uL (ref 0.0–0.1)
Immature Granulocytes: 0 %
Lymphocytes Absolute: 1.5 10*3/uL (ref 0.7–3.1)
Lymphs: 29 %
MCH: 30.1 pg (ref 26.6–33.0)
MCHC: 31.9 g/dL (ref 31.5–35.7)
MCV: 94 fL (ref 79–97)
Monocytes Absolute: 0.4 10*3/uL (ref 0.1–0.9)
Monocytes: 9 %
NEUTROS PCT: 61 %
Neutrophils Absolute: 3.2 10*3/uL (ref 1.4–7.0)
Platelets: 211 10*3/uL (ref 150–379)
RBC: 4.72 x10E6/uL (ref 3.77–5.28)
RDW: 13.3 % (ref 12.3–15.4)
WBC: 5.2 10*3/uL (ref 3.4–10.8)

## 2015-04-05 NOTE — Telephone Encounter (Signed)
Advised pt that her renal panel from 04/04/2015 came back "ok" per Mikki Santee. Renaldo Fiddler, CMA

## 2015-05-04 LAB — COMPREHENSIVE METABOLIC PANEL
ALK PHOS: 70 IU/L (ref 39–117)
ALT: 16 IU/L (ref 0–32)
AST: 14 IU/L (ref 0–40)
Albumin/Globulin Ratio: 2.1 (ref 1.1–2.5)
Albumin: 4.2 g/dL (ref 3.5–5.5)
BUN/Creatinine Ratio: 19 (ref 9–23)
BUN: 14 mg/dL (ref 6–24)
Bilirubin Total: 0.3 mg/dL (ref 0.0–1.2)
CALCIUM: 9.9 mg/dL (ref 8.7–10.2)
CO2: 26 mmol/L (ref 18–29)
Chloride: 100 mmol/L (ref 97–108)
Creatinine, Ser: 0.75 mg/dL (ref 0.57–1.00)
GFR calc Af Amer: 101 mL/min/{1.73_m2} (ref >59)
GFR, EST NON AFRICAN AMERICAN: 88 mL/min/{1.73_m2} (ref >59)
Globulin, Total: 2 g/dL (ref 1.5–4.5)
Glucose: 78 mg/dL (ref 65–99)
Potassium: 4.5 mmol/L (ref 3.5–5.2)
SODIUM: 142 mmol/L (ref 134–144)
Total Protein: 6.2 g/dL (ref 6.0–8.5)

## 2015-05-04 LAB — HEMOGLOBIN A1C
Est. average glucose Bld gHb Est-mCnc: 126 mg/dL
HEMOGLOBIN A1C: 6 % — AB (ref 4.8–5.6)

## 2015-05-04 LAB — VITAMIN D 25 HYDROXY (VIT D DEFICIENCY, FRACTURES): Vit D, 25-Hydroxy: 29.4 ng/mL — ABNORMAL LOW (ref 30.0–100.0)

## 2015-05-04 LAB — TSH: TSH: 2.94 u[IU]/mL (ref 0.450–4.500)

## 2015-05-09 ENCOUNTER — Other Ambulatory Visit: Payer: Self-pay | Admitting: Family Medicine

## 2015-05-09 ENCOUNTER — Other Ambulatory Visit: Payer: Self-pay

## 2015-05-09 DIAGNOSIS — E7800 Pure hypercholesterolemia, unspecified: Secondary | ICD-10-CM

## 2015-05-09 MED ORDER — ATORVASTATIN CALCIUM 20 MG PO TABS: 20.0000 mg | ORAL_TABLET | Freq: Every day | ORAL | Status: DC

## 2015-05-09 NOTE — Addendum Note (Signed)
Addended by: Ashley Royalty E on: 05/09/2015 02:53 PM   Modules accepted: Orders

## 2015-09-13 ENCOUNTER — Encounter: Payer: Self-pay | Admitting: Family Medicine

## 2015-09-13 ENCOUNTER — Ambulatory Visit (INDEPENDENT_AMBULATORY_CARE_PROVIDER_SITE_OTHER): Payer: 59 | Admitting: Family Medicine

## 2015-09-13 ENCOUNTER — Ambulatory Visit
Admission: RE | Admit: 2015-09-13 | Discharge: 2015-09-13 | Disposition: A | Discharge status: Home / Self Care | Payer: 59 | Source: Ambulatory Visit | Attending: Family Medicine | Admitting: Family Medicine

## 2015-09-13 VITALS — BP 122/84 | HR 82 | Temp 97.8°F | Resp 14 | Wt 145.8 lb

## 2015-09-13 DIAGNOSIS — M79671 Pain in right foot: Secondary | ICD-10-CM | POA: Diagnosis not present

## 2015-09-13 NOTE — Progress Notes (Signed)
Patient ID: KHYLI SWAIM, female   DOB: 1956-04-15, 59 y.o.   MRN: [id number] Name: Rachel Fry   MRN: [id number]    DOB: 03-18-1956   Date:09/13/2015       Progress Note  Subjective  Chief Complaint  Chief Complaint  Patient presents with  . Toe Pain    right great toe    Toe Pain  The incident occurred 2 days ago. There was no injury mechanism. The pain is present in the right toes and right foot (great toe). The quality of the pain is described as aching. The pain has been intermittent since onset. Associated symptoms include a loss of motion. Associated symptoms comments: Intermittently swelling and red.. The symptoms are aggravated by movement and weight bearing. She has tried acetaminophen for the symptoms. The treatment provided no relief.   Past Surgical History  Procedure Laterality Date  . Tubal ligation  1988  . Cesarean section    . Tonsillectomy and adenoidectomy    . Arm neuroplasty Right 1994 and 1996    for chronic pain  . Ovarian cyst removal  1973   Patient Active Problem List   Diagnosis Date Noted  . Acute stress disorder 02/25/2015  . Anxiety 02/25/2015  . Airway hyperreactivity 02/25/2015  . Bradycardia 02/25/2015  . Hypersomnia 02/25/2015  . Clinical depression 02/25/2015  . Elevated blood sugar 02/25/2015  . 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 Acid reflux 02/25/2015  . 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 H/O disease 02/25/2015  . Hypercholesteremia 02/25/2015  . Headache, migraine 02/25/2015  . Muscle ache 02/25/2015  . Allergic rhinitis, seasonal 02/25/2015  . Avitaminosis D 02/25/2015   Family History  Problem Relation Age of Onset  . Hypertension Mother   . Breast cancer Mother   . Thyroid disease Mother   . Stroke Mother   . Dementia Mother   . Cardiomyopathy Father   . Peripheral Artery Disease Father   . Hyperlipidemia Brother   . Hypertension Brother   . Colon cancer Maternal Grandmother   . Stroke Paternal Grandfather   . Hypertension  Brother   . Hyperlipidemia Brother   . Thyroid disease Brother   . Hypertension Brother   . Birth defects Brother   . Congenital heart disease Brother   . Cancer Brother     Kidney cancer   Past Medical History  Diagnosis Date  . Allergy    Social History  Substance Use Topics  . Smoking status: Never Smoker   . Smokeless tobacco: Never Used  . Alcohol Use: No    Current outpatient prescriptions:  .  atorvastatin (LIPITOR) 20 MG tablet, Take 1 tablet (20 mg total) by mouth daily., Disp: 90 tablet, Rfl: 3 .  botulinum toxin Type A (BOTOX) 100 UNITS SOLR injection, Botox - Historical Medication  once every 3 months for migraines Active, Disp: , Rfl:  .  cetirizine (ZYRTEC) 10 MG tablet, Take by mouth., Disp: , Rfl:  .  DULoxetine (CYMBALTA) 30 MG capsule, Take 1 capsule (30 mg total) by mouth daily., Disp: 30 capsule, Rfl: 3 .  fluticasone (FLOVENT HFA) 110 MCG/ACT inhaler, Inhale into the lungs., Disp: , Rfl:  .  frovatriptan (FROVA) 2.5 MG tablet, Take by mouth., Disp: , Rfl:  .  ibuprofen (ADVIL,MOTRIN) 200 MG tablet, Take by mouth., Disp: , Rfl:  .  lansoprazole (PREVACID) 15 MG capsule, Take by mouth., Disp: , Rfl:  .  SUMAtriptan Succinate (ONZETRA) 11 MG/NOSEPC EXHP, Place 22 mg into the  nose as needed (for migraine)., Disp: , Rfl:  .  TRIAMCINOLONE ACETONIDE, NASAL STEROIDS,, Place into the nose., Disp: , Rfl:  .  Vitamin D, Ergocalciferol, (DRISDOL) 50000 UNITS CAPS capsule, Take by mouth., Disp: , Rfl:  .  zafirlukast (ACCOLATE) 10 MG tablet, Take by mouth., Disp: , Rfl:   Allergies  Allergen Reactions  . Codeine   . Fish Oil Itching  . Sulfa Antibiotics     abdominal pain   Review of Systems  Constitutional: Negative.   HENT: Negative.   Eyes: Negative.   Respiratory: Negative.   Cardiovascular: Negative.   Gastrointestinal: Negative.   Genitourinary: Negative.   Musculoskeletal: Positive for joint pain.  Skin: Negative.   Neurological: Negative.    Endo/Heme/Allergies: Negative.   Psychiatric/Behavioral: Negative.    Objective  Filed Vitals:   09/13/15 1501  BP: 122/84  Pulse: 82  Temp: 97.8 F (36.6 C)  TempSrc: Oral  Resp: 14  Weight: 145 lb 12.8 oz (66.134 kg)  SpO2: 97%   Physical Exam  Constitutional: She is oriented to person, place, and time and well-developed, well-nourished, and in no distress.  HENT:  Head: Normocephalic.  Eyes: Conjunctivae and EOM are normal.  Neck: Neck supple.  Musculoskeletal: She exhibits tenderness.  Enlarged right first MTP joint at base of great toe. No redness at present (occurs intermittently) but some soreness remains. Good pulses and sensation in feet.  Neurological: She is alert and oriented to person, place, and time.  Skin: No rash noted.  Psychiatric: Mood, affect and judgment normal.    Assessment & Plan  1. Right foot pain Flare recently with history of intermittent episodes of redness and swelling of the right first MTP joint. Some tenderness to palpate. Has a history of fibromyalgia and ignores much of her pains. This one seemed to be different. May use Aleve BID prn. Will get x-ray and labs to rule out gout, osteoarthritis or bunion formation. Elevate when possible and recheck pending reports. - DG Foot Complete Right - Uric acid - CBC with Differential/Platelet - Sed Rate (ESR)

## 2015-09-13 NOTE — Patient Instructions (Signed)

## 2015-09-14 LAB — CBC WITH DIFFERENTIAL/PLATELET
Basophils Absolute: 0 10*3/uL (ref 0.0–0.2)
Basos: 0 %
EOS (ABSOLUTE): 0 10*3/uL (ref 0.0–0.4)
EOS: 0 %
HEMATOCRIT: 42.9 % (ref 34.0–46.6)
HEMOGLOBIN: 14.1 g/dL (ref 11.1–15.9)
IMMATURE GRANULOCYTES: 0 %
Immature Grans (Abs): 0 10*3/uL (ref 0.0–0.1)
Lymphocytes Absolute: 2.1 10*3/uL (ref 0.7–3.1)
Lymphs: 32 %
MCH: 29.9 pg (ref 26.6–33.0)
MCHC: 32.9 g/dL (ref 31.5–35.7)
MCV: 91 fL (ref 79–97)
MONOCYTES: 6 %
MONOS ABS: 0.4 10*3/uL (ref 0.1–0.9)
NEUTROS PCT: 62 %
Neutrophils Absolute: 4 10*3/uL (ref 1.4–7.0)
Platelets: 253 10*3/uL (ref 150–379)
RBC: 4.71 x10E6/uL (ref 3.77–5.28)
RDW: 13.5 % (ref 12.3–15.4)
WBC: 6.6 10*3/uL (ref 3.4–10.8)

## 2015-09-14 LAB — URIC ACID: Uric Acid: 3.3 mg/dL (ref 2.5–7.1)

## 2015-09-14 LAB — SEDIMENTATION RATE: Sed Rate: 2 mm/hr (ref 0–40)

## 2015-09-16 ENCOUNTER — Telehealth: Payer: Self-pay

## 2015-09-16 NOTE — Telephone Encounter (Signed)
-----   Message from Margo Common, [place] sent at 09/13/2015  5:53 PM EST ----- Mild degenerative/arthritic changes in the toe joint giving discomfort. Awaiting lab reports to make final determination of gout or osteoarthritis.

## 2015-09-16 NOTE — Telephone Encounter (Signed)
Patient advised as directed below. Patient verbalized understanding.  

## 2015-09-23 ENCOUNTER — Telehealth: Payer: Self-pay

## 2015-09-23 NOTE — Telephone Encounter (Signed)
Pt returning call.  CB#[phone]/MW

## 2015-09-23 NOTE — Telephone Encounter (Signed)
Left message to call back  

## 2015-09-23 NOTE — Telephone Encounter (Signed)
-----   Message from Margo Common, [place] sent at 09/23/2015  5:41 AM EST ----- All blood tests negative for gout or infection. 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 Recommend Aleve BID for discomfort. If persistent, should consider referral to podiatrist for possible cortisone injection.

## 2015-09-23 NOTE — Telephone Encounter (Signed)
Patient advised as directed below. Patient verbalized understanding.  

## 2015-10-10 ENCOUNTER — Ambulatory Visit
Admission: RE | Admit: 2015-10-10 | Discharge: 2015-10-10 | Disposition: A | Discharge status: Home / Self Care | Payer: 59 | Source: Ambulatory Visit | Attending: Family Medicine | Admitting: Family Medicine

## 2015-10-10 DIAGNOSIS — Z1231 Encounter for screening mammogram for malignant neoplasm of breast: Secondary | ICD-10-CM | POA: Insufficient documentation

## 2015-10-10 DIAGNOSIS — Z0000 Encounter for general adult medical examination without abnormal findings: Secondary | ICD-10-CM

## 2015-11-13 DIAGNOSIS — G43719 Chronic migraine without aura, intractable, without status migrainosus: Secondary | ICD-10-CM | POA: Diagnosis not present

## 2015-11-13 DIAGNOSIS — M542 Cervicalgia: Secondary | ICD-10-CM | POA: Diagnosis not present

## 2015-11-13 DIAGNOSIS — R51 Headache: Secondary | ICD-10-CM | POA: Diagnosis not present

## 2015-11-13 DIAGNOSIS — G43019 Migraine without aura, intractable, without status migrainosus: Secondary | ICD-10-CM | POA: Diagnosis not present

## 2015-11-13 DIAGNOSIS — M791 Myalgia: Secondary | ICD-10-CM | POA: Diagnosis not present

## 2015-12-25 DIAGNOSIS — G43019 Migraine without aura, intractable, without status migrainosus: Secondary | ICD-10-CM | POA: Diagnosis not present

## 2015-12-25 DIAGNOSIS — G43719 Chronic migraine without aura, intractable, without status migrainosus: Secondary | ICD-10-CM | POA: Diagnosis not present

## 2016-02-15 ENCOUNTER — Encounter: Payer: Self-pay | Admitting: Family Medicine

## 2016-02-15 ENCOUNTER — Ambulatory Visit (INDEPENDENT_AMBULATORY_CARE_PROVIDER_SITE_OTHER): Payer: 59 | Admitting: Family Medicine

## 2016-02-15 VITALS — BP 110/80 | HR 64 | Temp 97.8°F | Resp 16 | Wt 150.0 lb

## 2016-02-15 DIAGNOSIS — H6501 Acute serous otitis media, right ear: Secondary | ICD-10-CM | POA: Diagnosis not present

## 2016-02-15 DIAGNOSIS — J302 Other seasonal allergic rhinitis: Secondary | ICD-10-CM

## 2016-02-15 MED ORDER — AMOXICILLIN 500 MG PO CAPS: 500.0000 mg | ORAL_CAPSULE | Freq: Three times a day (TID) | ORAL | Status: DC

## 2016-02-15 NOTE — Progress Notes (Signed)
Patient ID: [name], female   DOB: [birth year], 60 y.o.   MRN: [id number]       Patient: Rachel Fry Female    DOB: 11-21-1955   60 y.o.   MRN: [id number] Visit Date: 02/15/2016  Today's Provider: Vernie Murders, PA   Chief Complaint  Patient presents with  . Otitis Media   Subjective:    HPI Ear Pain: Patient presents with right ear pain.  Symptoms include right ear pain, nausea, sinus pressure on right side of face. Symptoms began 1 days ago and are gradually worsening since that time. Patient denies fever. Ear history: 0 previous ear infections.   Past Medical History  Diagnosis Date  . Allergy    Patient Active Problem List   Diagnosis Date Noted  . Acute stress disorder 02/25/2015  . Anxiety 02/25/2015  . Airway hyperreactivity 02/25/2015  . Bradycardia 02/25/2015  . Hypersomnia 02/25/2015  . Clinical depression 02/25/2015  . Elevated blood sugar 02/25/2015  . 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 Acid reflux 02/25/2015  . 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 H/O disease 02/25/2015  . Hypercholesteremia 02/25/2015  . Headache, migraine 02/25/2015  . Muscle ache 02/25/2015  . Allergic rhinitis, seasonal 02/25/2015  . Avitaminosis D 02/25/2015   Past Surgical History  Procedure Laterality Date  . Tubal ligation  1988  . Cesarean section    . Tonsillectomy and adenoidectomy    . Arm neuroplasty Right 1994 and 1996    for chronic pain  . Ovarian cyst removal  1973   . Family History  Problem Relation Age of Onset  . Hypertension Mother   . Breast cancer Mother 32  . Thyroid disease Mother   . Stroke Mother   . Dementia Mother   . Cardiomyopathy Father   . Peripheral Artery Disease Father   . Hyperlipidemia Brother   . Hypertension Brother   . Colon cancer Maternal Grandmother   . Stroke Paternal Grandfather   . Hypertension Brother   . Hyperlipidemia Brother   . Thyroid disease Brother   . Hypertension Brother   . Birth defects Brother   . Congenital  heart disease Brother   . Cancer Brother     Kidney cancer   Allergies  Allergen Reactions  . Codeine   . Fish Oil Itching  . Sulfa Antibiotics     abdominal pain   Previous Medications   ATORVASTATIN (LIPITOR) 20 MG TABLET    Take 1 tablet (20 mg total) by mouth daily.   BOTULINUM TOXIN TYPE A (BOTOX) 100 UNITS SOLR INJECTION    Botox - Historical Medication  once every 3 months for migraines Active   CETIRIZINE (ZYRTEC) 10 MG TABLET    Take by mouth.   FLUTICASONE (FLOVENT HFA) 110 MCG/ACT INHALER    Inhale 2 puffs into the lungs daily.    FROVATRIPTAN (FROVA) 2.5 MG TABLET    Take 2.5 mg by mouth as needed.    LANSOPRAZOLE (PREVACID) 15 MG CAPSULE    Take 15 mg by mouth daily.    SUMATRIPTAN SUCCINATE (ONZETRA) 11 MG/NOSEPC EXHP    Place 22 mg into the nose as needed (for migraine).   TRIAMCINOLONE ACETONIDE, NASAL STEROIDS,    Place 2 sprays into the nose at bedtime.    VITAMIN D, ERGOCALCIFEROL, (DRISDOL) 50000 UNITS CAPS CAPSULE    Take 50,000 Units by mouth every 30 (thirty) days.    ZAFIRLUKAST (ACCOLATE) 10 MG TABLET    Take 10 mg by mouth  2 (two) times daily.     Review of Systems  Constitutional: Negative.   HENT: Positive for ear pain. Negative for rhinorrhea.   Respiratory: Negative for cough.   Neurological: Positive for dizziness.    Social History  Substance Use Topics  . Smoking status: Never Smoker   . Smokeless tobacco: Never Used  . Alcohol Use: No   Objective:   BP 110/80 mmHg  Pulse 64  Temp(Src) 97.8 F (36.6 C) (Oral)  Resp 16  Wt 150 lb (68.04 kg)  SpO2 97%  Physical Exam  Constitutional: She is oriented to person, place, and time. She appears well-developed and well-nourished. No distress.  HENT:  Head: Normocephalic and atraumatic.  Right Ear: Hearing normal.  Left Ear: Hearing and external ear normal.  Nose: Nose normal.  Mouth/Throat: Oropharynx is clear and moist.  Fluid and bulging right TM. No redness or drainage.  Eyes:  Conjunctivae and lids are normal. Right eye exhibits no discharge. Left eye exhibits no discharge. No scleral icterus.  Neck: Neck supple.  Cardiovascular: Normal rate and regular rhythm.   Pulmonary/Chest: Effort normal and breath sounds normal. No respiratory distress.  Abdominal: Soft. Bowel sounds are normal.  Musculoskeletal: Normal range of motion.  Lymphadenopathy:    She has no cervical adenopathy.  Neurological: She is alert and oriented to person, place, and time.  Skin: Skin is intact. No lesion and no rash noted.  Psychiatric: She has a normal mood and affect. Her speech is normal and behavior is normal. Thought content normal.      Assessment & Plan:     1. Right acute serous otitis media, recurrence not specified Onset of discomfort the past day or two. No significant increase in dizziness but more noticeable. Has a history of chronic vestibular dysfunction. Continue use of allergy medications (Accolate, nasal steroid and antihistamine). Given antibiotic and recheck prn. - amoxicillin (AMOXIL) 500 MG capsule; Take 1 capsule (500 mg total) by mouth 3 (three) times daily.  Dispense: 30 capsule; Refill: 0  2. Allergic rhinitis, seasonal Chronic problem in the spring. Continue present medication regimen and may use Saline nasal spray prn.       Vernie Murders, PA  [facility]

## 2016-02-15 NOTE — Patient Instructions (Signed)
Serous Otitis Media Serous otitis media is fluid in the middle ear space. This space contains the bones for hearing and air. Air in the middle ear space helps to transmit sound.  The air gets there through the eustachian tube. This tube goes from the back of the nose (nasopharynx) to the middle ear space. It keeps the pressure in the middle ear the same as the outside world. It also helps to drain fluid from the middle ear space. CAUSES  Serous otitis media occurs when the eustachian tube gets blocked. Blockage can come from:  Ear infections.  Colds and other upper respiratory infections.  Allergies.  Irritants such as cigarette smoke.  Sudden changes in air pressure (such as descending in an airplane).  Enlarged adenoids.  A mass in the nasopharynx. During colds and upper respiratory infections, the middle ear space can become temporarily filled with fluid. This can happen after an ear infection also. Once the infection clears, the fluid will generally drain out of the ear through the eustachian tube. If it does not, then serous otitis media occurs. SIGNS AND SYMPTOMS   Hearing loss.  A feeling of fullness in the ear, without pain.  Young children may not show any symptoms but may show slight behavioral changes, such as agitation, ear pulling, or crying. DIAGNOSIS  Serous otitis media is diagnosed by an ear exam. Tests may be done to check on the movement of the eardrum. Hearing exams may also be done. TREATMENT  The fluid most often goes away without treatment. If allergy is the cause, allergy treatment may be helpful. Fluid that persists for several months may require minor surgery. A small tube is placed in the eardrum to:  Drain the fluid.  Restore the air in the middle ear space. In certain situations, antibiotic medicines are used to avoid surgery. Surgery may be done to remove enlarged adenoids (if this is the cause). HOME CARE INSTRUCTIONS   Keep children away from  tobacco smoke.  Keep all follow-up visits as directed by your health care provider. SEEK MEDICAL CARE IF:   Your hearing is not better in 3 months.  Your hearing is worse.  You have ear pain.  You have drainage from the ear.  You have dizziness.  You have serous otitis media only in one ear or have any bleeding from your nose (epistaxis).  You notice a lump on your neck. MAKE SURE YOU:  Understand these instructions.   Will watch your condition.   Will get help right away if you are not doing well or get worse.    This information is not intended to replace advice given to you by your health care provider. Make sure you discuss any questions you have with your health care provider.   Document Released: 01/02/2004 Document Revised: 11/02/2014 Document Reviewed: 05/09/2013 Elsevier Interactive Patient Education 2016 [employer]  

## 2016-02-20 DIAGNOSIS — M542 Cervicalgia: Secondary | ICD-10-CM | POA: Diagnosis not present

## 2016-02-20 DIAGNOSIS — R51 Headache: Secondary | ICD-10-CM | POA: Diagnosis not present

## 2016-02-20 DIAGNOSIS — G43019 Migraine without aura, intractable, without status migrainosus: Secondary | ICD-10-CM | POA: Diagnosis not present

## 2016-02-20 DIAGNOSIS — G43719 Chronic migraine without aura, intractable, without status migrainosus: Secondary | ICD-10-CM | POA: Diagnosis not present

## 2016-02-20 DIAGNOSIS — M791 Myalgia: Secondary | ICD-10-CM | POA: Diagnosis not present

## 2016-03-16 DIAGNOSIS — G43019 Migraine without aura, intractable, without status migrainosus: Secondary | ICD-10-CM | POA: Diagnosis not present

## 2016-03-16 DIAGNOSIS — M542 Cervicalgia: Secondary | ICD-10-CM | POA: Diagnosis not present

## 2016-03-16 DIAGNOSIS — G43719 Chronic migraine without aura, intractable, without status migrainosus: Secondary | ICD-10-CM | POA: Diagnosis not present

## 2016-03-16 DIAGNOSIS — M791 Myalgia: Secondary | ICD-10-CM | POA: Diagnosis not present

## 2016-03-16 DIAGNOSIS — R51 Headache: Secondary | ICD-10-CM | POA: Diagnosis not present

## 2016-03-20 ENCOUNTER — Encounter: Payer: Self-pay | Admitting: Family Medicine

## 2016-03-20 ENCOUNTER — Ambulatory Visit (INDEPENDENT_AMBULATORY_CARE_PROVIDER_SITE_OTHER): Payer: 59 | Admitting: Family Medicine

## 2016-03-20 VITALS — BP 124/88 | HR 70 | Temp 97.8°F | Resp 14 | Wt 152.0 lb

## 2016-03-20 DIAGNOSIS — R42 Dizziness and giddiness: Secondary | ICD-10-CM | POA: Diagnosis not present

## 2016-03-20 MED ORDER — MECLIZINE HCL 25 MG PO TABS: 25.0000 mg | ORAL_TABLET | Freq: Three times a day (TID) | ORAL | Status: DC | PRN

## 2016-03-20 NOTE — Progress Notes (Signed)
Subjective:     Patient ID: Rachel Fry, female   DOB: 03-02-1956, 60 y.o.   MRN: [id number]  HPI  Chief Complaint  Patient presents with  . Dizziness    chronic problem, but worse today  States she feels like she is spinning around. Currently followed by neurology at [facility] in [place] for vestibular migraines. 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 Receives both Botox and trigger point injections in addition to oral/nasal migraine medication. States she has one more dose of Onzetra she an take this week. States her migraine symptoms/paresthesias are on the left side of her body.   Review of Systems     Objective:   Physical Exam  Constitutional: She appears well-developed and well-nourished. No distress.  Eyes: EOM are normal. Pupils are equal, round, and reactive to light.  Neurological: Coordination (finger to nose and Romberg WNL) normal.       Assessment:    1. Migrainous dizziness: she will take Onzetra and use the meclizine as a trial. - meclizine (ANTIVERT) 25 MG tablet; Take 1 tablet (25 mg total) by mouth 3 (three) times daily as needed for dizziness.  Dispense: 21 tablet; Refill: 0    Plan:    F/u with neurology.

## 2016-03-20 NOTE — Patient Instructions (Signed)
Do follow up with your neurologist as needed.

## 2016-03-25 DIAGNOSIS — G43019 Migraine without aura, intractable, without status migrainosus: Secondary | ICD-10-CM | POA: Diagnosis not present

## 2016-03-25 DIAGNOSIS — G43719 Chronic migraine without aura, intractable, without status migrainosus: Secondary | ICD-10-CM | POA: Diagnosis not present

## 2016-03-26 ENCOUNTER — Other Ambulatory Visit: Payer: Self-pay | Admitting: Specialist

## 2016-03-27 ENCOUNTER — Other Ambulatory Visit: Payer: Self-pay | Admitting: Specialist

## 2016-03-27 DIAGNOSIS — R51 Headache: Principal | ICD-10-CM

## 2016-03-27 DIAGNOSIS — R519 Headache, unspecified: Secondary | ICD-10-CM

## 2016-03-30 ENCOUNTER — Other Ambulatory Visit: Payer: Self-pay | Admitting: Family Medicine

## 2016-03-30 DIAGNOSIS — G43019 Migraine without aura, intractable, without status migrainosus: Secondary | ICD-10-CM | POA: Diagnosis not present

## 2016-03-30 DIAGNOSIS — G43719 Chronic migraine without aura, intractable, without status migrainosus: Secondary | ICD-10-CM | POA: Diagnosis not present

## 2016-03-30 DIAGNOSIS — E7800 Pure hypercholesterolemia, unspecified: Secondary | ICD-10-CM

## 2016-03-30 NOTE — Telephone Encounter (Signed)
07/24/14 last lipid check

## 2016-03-31 ENCOUNTER — Other Ambulatory Visit (HOSPITAL_COMMUNITY): Payer: Self-pay | Admitting: Specialist

## 2016-03-31 DIAGNOSIS — R51 Headache: Principal | ICD-10-CM

## 2016-03-31 DIAGNOSIS — R519 Headache, unspecified: Secondary | ICD-10-CM

## 2016-04-01 ENCOUNTER — Emergency Department: Payer: 59

## 2016-04-01 ENCOUNTER — Encounter: Payer: Self-pay | Admitting: Emergency Medicine

## 2016-04-01 ENCOUNTER — Ambulatory Visit
Admission: EM | Admit: 2016-04-01 | Discharge: 2016-04-01 | Disposition: A | Discharge status: Hospice | Payer: 59 | Attending: Family Medicine | Admitting: Family Medicine

## 2016-04-01 ENCOUNTER — Emergency Department
Admission: EM | Admit: 2016-04-01 | Discharge: 2016-04-01 | Disposition: A | Discharge status: Home / Self Care | Payer: 59 | Attending: Emergency Medicine | Admitting: Emergency Medicine

## 2016-04-01 DIAGNOSIS — R519 Headache, unspecified: Secondary | ICD-10-CM

## 2016-04-01 DIAGNOSIS — G8929 Other chronic pain: Secondary | ICD-10-CM | POA: Diagnosis not present

## 2016-04-01 DIAGNOSIS — R51 Headache: Secondary | ICD-10-CM

## 2016-04-01 DIAGNOSIS — R531 Weakness: Secondary | ICD-10-CM | POA: Diagnosis present

## 2016-04-01 DIAGNOSIS — R200 Anesthesia of skin: Secondary | ICD-10-CM

## 2016-04-01 DIAGNOSIS — R202 Paresthesia of skin: Secondary | ICD-10-CM | POA: Diagnosis not present

## 2016-04-01 DIAGNOSIS — F329 Major depressive disorder, single episode, unspecified: Secondary | ICD-10-CM | POA: Insufficient documentation

## 2016-04-01 HISTORY — DX: Personal history of other drug therapy: Z92.29

## 2016-04-01 HISTORY — DX: Migraine, unspecified, not intractable, without status migrainosus: G43.909

## 2016-04-01 HISTORY — DX: Anesthesia of skin: R20.0

## 2016-04-01 HISTORY — DX: Paresthesia of skin: R20.2

## 2016-04-01 LAB — COMPREHENSIVE METABOLIC PANEL
ALK PHOS: 57 U/L (ref 38–126)
ALT: 16 U/L (ref 14–54)
ANION GAP: 7 (ref 5–15)
AST: 15 U/L (ref 15–41)
Albumin: 4 g/dL (ref 3.5–5.0)
BILIRUBIN TOTAL: 0.7 mg/dL (ref 0.3–1.2)
BUN: 17 mg/dL (ref 6–20)
CALCIUM: 9.6 mg/dL (ref 8.9–10.3)
CO2: 31 mmol/L (ref 22–32)
Chloride: 102 mmol/L (ref 101–111)
Creatinine, Ser: 0.8 mg/dL (ref 0.44–1.00)
GFR calc non Af Amer: >60 mL/min (ref >60)
Glucose, Bld: 102 mg/dL — ABNORMAL HIGH (ref 65–99)
Potassium: 3.5 mmol/L (ref 3.5–5.1)
SODIUM: 140 mmol/L (ref 135–145)
TOTAL PROTEIN: 6.6 g/dL (ref 6.5–8.1)

## 2016-04-01 LAB — CBC WITH DIFFERENTIAL/PLATELET
BASOS ABS: 0 10*3/uL (ref 0–0.1)
EOS ABS: 0 10*3/uL (ref 0–0.7)
Eosinophils Relative: 0 %
HCT: 45 % (ref 35.0–47.0)
HEMOGLOBIN: 14.8 g/dL (ref 12.0–16.0)
Lymphocytes Relative: 26 %
Lymphs Abs: 3 10*3/uL (ref 1.0–3.6)
MCH: 30.2 pg (ref 26.0–34.0)
MCHC: 32.9 g/dL (ref 32.0–36.0)
MCV: 91.9 fL (ref 80.0–100.0)
Monocytes Absolute: 0.8 10*3/uL (ref 0.2–0.9)
Monocytes Relative: 7 %
Neutro Abs: 7.8 10*3/uL — ABNORMAL HIGH (ref 1.4–6.5)
Platelets: 204 10*3/uL (ref 150–440)
RBC: 4.9 MIL/uL (ref 3.80–5.20)
RDW: 13.8 % (ref 11.5–14.5)
WBC: 11.7 10*3/uL — AB (ref 3.6–11.0)

## 2016-04-01 MED ORDER — OXYCODONE-ACETAMINOPHEN 5-325 MG PO TABS
1.0000 | ORAL_TABLET | Freq: Once | ORAL | Status: AC
  Administered 2016-04-01: 1 via ORAL
  Filled 2016-04-01: qty 1

## 2016-04-01 MED ORDER — ONDANSETRON HCL 4 MG PO TABS
4.0000 mg | ORAL_TABLET | Freq: Once | ORAL | Status: AC
  Administered 2016-04-01: 4 mg via ORAL
  Filled 2016-04-01: qty 1

## 2016-04-01 NOTE — ED Provider Notes (Signed)
[facility] Emergency Department Provider Note        Time seen: ----------------------------------------- 6:54 PM on 04/01/2016 -----------------------------------------    I have reviewed the triage vital signs and the nursing notes.   HISTORY  Chief Complaint Migraine and Weakness    HPI Rachel Fry is a 60 y.o. female who presents to ER for left-sided weakness and tingling. Patient states she's had these symptoms for several weeks with a gotten worse. She has an outpatient MRI scheduled in 2 days. She states she has chronic headaches, seen at the [facility] in [place]. She denies any recent illness or other complaints.   Past Medical History  Diagnosis Date  . Allergy   . Migraines   . Numbness and tingling     left side of body, occasionally right side   . S/P Botox injection     Patient Active Problem List   Diagnosis Date Noted  . Acute stress disorder 02/25/2015  . Anxiety 02/25/2015  . Airway hyperreactivity 02/25/2015  . Bradycardia 02/25/2015  . Hypersomnia 02/25/2015  . Clinical depression 02/25/2015  . Elevated blood sugar 02/25/2015  . 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 Acid reflux 02/25/2015  . 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 H/O disease 02/25/2015  . Hypercholesteremia 02/25/2015  . Headache, migraine 02/25/2015  . Muscle ache 02/25/2015  . Allergic rhinitis, seasonal 02/25/2015  . Avitaminosis D 02/25/2015    Past Surgical History  Procedure Laterality Date  . Tubal ligation  1988  . Cesarean section    . Tonsillectomy and adenoidectomy    . Arm neuroplasty Right 1994 and 1996    for chronic pain  . Ovarian cyst removal  1973    Allergies Codeine; Fish oil; and Sulfa antibiotics  Social History Social History  Substance Use Topics  . Smoking status: Never Smoker   . Smokeless tobacco: Never Used  . Alcohol Use: No    Review of Systems Constitutional: Negative for fever. Eyes: Negative for visual  changes. ENT: Negative for sore throat. Cardiovascular: Negative for chest pain. Respiratory: Negative for shortness of breath. Gastrointestinal: Negative for abdominal pain, vomiting and diarrhea. Genitourinary: Negative for dysuria. Musculoskeletal: Negative for back pain. Skin: Negative for rash. Neurological: Positive for chronic headaches, left-sided paresthesias and weakness  10-point ROS otherwise negative.  ____________________________________________   PHYSICAL EXAM:  VITAL SIGNS: ED Triage Vitals  Enc Vitals Group     BP 04/01/16 1819 158/108 mmHg     Pulse Rate 04/01/16 1819 72     Resp 04/01/16 1819 18     Temp 04/01/16 1819 98 F (36.7 C)     Temp Source 04/01/16 1819 Oral     SpO2 04/01/16 1819 100 %     Weight 04/01/16 1819 147 lb (66.679 kg)     Height 04/01/16 1819 5\' 7"  (1.702 m)     Head Cir --      Peak Flow --      Pain Score 04/01/16 1819 3     Pain Loc --      Pain Edu? --      Excl. in [place]? --     Constitutional: Alert and oriented. Well appearing and in no distress. Eyes: Conjunctivae are normal. PERRL. Normal extraocular movements. ENT   Head: Normocephalic and atraumatic.   Nose: No congestion/rhinnorhea.   Mouth/Throat: Mucous membranes are moist.   Neck: No stridor. Cardiovascular: Normal rate, regular rhythm. No murmurs, rubs, or gallops. Respiratory: Normal respiratory effort without tachypnea  nor retractions. Breath sounds are clear and equal bilaterally. No wheezes/rales/rhonchi. Gastrointestinal: Soft and nontender. Normal bowel sounds Musculoskeletal: Nontender with normal range of motion in all extremities. No lower extremity tenderness nor edema. Neurologic:  Normal speech and language. No gross focal neurologic deficits are appreciated. Normal sensory and motor function is appreciated. Normal cerebellar function, cranial nerves appear to be intact. Subjective paresthesias on the left side of her body. Skin:  Skin is  warm, dry and intact. No rash noted. Psychiatric: Mood and affect are normal. Speech and behavior are normal.  ____________________________________________  ED COURSE:  Pertinent labs & imaging results that were available during my care of the patient were reviewed by me and considered in my medical decision making (see chart for details). Patient is in no acute distress, we will arrange MRI, I will give oral pain medicine. ____________________________________________    LABS (pertinent positives/negatives)  Labs Reviewed  CBC WITH DIFFERENTIAL/PLATELET  COMPREHENSIVE METABOLIC PANEL    RADIOLOGY MRI of the head Is pending at this time ____________________________________________  FINAL ASSESSMENT AND PLAN  Chronic headaches, left-sided paresthesias  Plan: Patient with labs and imaging as dictated above. Patient likely with migraines and symptoms secondary to same. MRI is pending.   Earleen Newport, MD   Note: This dictation was prepared with Dragon dictation. Any transcriptional errors that result from this process are unintentional   Earleen Newport, MD 04/01/16 [phone]

## 2016-04-01 NOTE — Discharge Instructions (Signed)

## 2016-04-01 NOTE — ED Notes (Signed)
Pt also reports nausea that gets worse when the tingling gets worse, dizziness, and loss of balance.

## 2016-04-01 NOTE — ED Notes (Signed)
Pt sent to ER from urgent Care for left sided weakness. Pt scheduled for outpatient MRI scheduled in 2 days. PT states sx are worse. Pt alert and oriented X4, active, cooperative, pt in NAD. RR even and unlabored, color WNL.

## 2016-04-01 NOTE — ED Notes (Signed)
Pt in via triage with complaintsd of left sided weakness, tingling x 3-4 weeks.  Pt is being seen by neurologist and has MRI scheduled for Saturday but states the weakness, "different sensation" has been worse today.  Pt was advised to come to ER from urgent care center.  Pt A/Ox4, hypertensive upon arrival, other vitals WDL.  No immediate distress noted.

## 2016-04-01 NOTE — ED Provider Notes (Signed)
CSN: [id number]     Arrival date & time 04/01/16  1700 History   First MD Initiated Contact with Patient 04/01/16 1717     Chief Complaint  Patient presents with  . Migraine  . Numbness   (Consider location/radiation/quality/duration/timing/severity/associated sxs/prior Treatment) HPI: Patient presents today with symptoms of left-sided headache with left-sided tingling and numbness for approximately 4 weeks. Patient states that the symptoms are pretty constant. She has been able to eat and sleep. Patient states that she is followed by her neurologist Dr. Domingo Cocking and [place]. She was put on prednisone recently for her symptoms. She has a MRI head scheduled in a few days. Patient denies any vision changes, slurred speech, worsening headache. Her blood pressure is elevated in the office today. She has no history of hypertension. 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 She denies any nausea, vomiting, chest pain or shortness of breath. She has been taking her sumatriptan as instructed for her migraines by her neurologist. She has not taken any NSAIDs or Tylenol. She tried taking meclizine on one occasion but did not see any benefit so stopped taking the medication.  Past Medical History  Diagnosis Date  . Allergy   . Migraines   . Numbness and tingling     left side of body, occasionally right side   . S/P Botox injection    Past Surgical History  Procedure Laterality Date  . Tubal ligation  1988  . Cesarean section    . Tonsillectomy and adenoidectomy    . Arm neuroplasty Right 1994 and 1996    for chronic pain  . Ovarian cyst removal  1973   Family History  Problem Relation Age of Onset  . Hypertension Mother   . Breast cancer Mother 14  . Thyroid disease Mother   . Stroke Mother   . Dementia Mother   . Cardiomyopathy Father   . Peripheral Artery Disease Father   . Hyperlipidemia Brother   . Hypertension Brother   .  Colon cancer Maternal Grandmother   . Stroke Paternal Grandfather   . Hypertension Brother   . Hyperlipidemia Brother   . Thyroid disease Brother   . Hypertension Brother   . Birth defects Brother   . Congenital heart disease Brother   . Cancer Brother     Kidney cancer   Social History  Substance Use Topics  . Smoking status: Never Smoker   . Smokeless tobacco: Never Used  . Alcohol Use: No   OB History    No data available     Review of Systems: Negative except mentioned above.   Allergies  Codeine; Fish oil; and Sulfa antibiotics  Home Medications   Prior to Admission medications   Medication Sig Start Date End Date Taking? Authorizing Provider  atorvastatin (LIPITOR) 20 MG tablet TAKE 1 TABLET (20 MG TOTAL) BY MOUTH DAILY. 03/30/16   Margarita Rana, MD  botulinum toxin Type A (BOTOX) 100 UNITS SOLR injection Botox - Historical Medication  once every 3 months for migraines Active    Historical Provider, MD  cetirizine (ZYRTEC) 10 MG tablet Take by mouth. 02/04/12   Historical Provider, MD  diclofenac (VOLTAREN) 50 MG EC tablet Reported on 03/20/2016 03/13/16   Historical Provider, MD  fluticasone (FLOVENT HFA) 110 MCG/ACT inhaler Inhale 2 puffs into the lungs daily.     Historical Provider, MD  frovatriptan (FROVA) 2.5 MG tablet Take 2.5 mg by mouth as  needed.     Historical Provider, MD  lansoprazole (PREVACID) 15 MG capsule Take 15 mg by mouth daily.     Historical Provider, MD  meclizine (ANTIVERT) 25 MG tablet Take 1 tablet (25 mg total) by mouth 3 (three) times daily as needed for dizziness. 03/20/16   Carmon Ginsberg, PA  SUMAtriptan Succinate (ONZETRA) 11 MG/NOSEPC EXHP Place 22 mg into the nose as needed (for migraine).    Historical Provider, MD  TRIAMCINOLONE ACETONIDE, NASAL STEROIDS, Place 2 sprays into the nose at bedtime.  02/04/12   Historical Provider, MD  Vitamin D, Ergocalciferol, (DRISDOL) 50000 UNITS CAPS capsule Take 50,000 Units by mouth every 30 (thirty) days.      Historical Provider, MD  zafirlukast (ACCOLATE) 10 MG tablet Take 10 mg by mouth 2 (two) times daily.  02/04/12   Historical Provider, MD   Meds Ordered and Administered this Visit  Medications - No data to display  BP 170/94 mmHg  Pulse 68  Temp(Src) 97.8 F (36.6 C) (Oral)  Resp 20  Ht 5\' 7"  (1.702 m)  Wt 150 lb (68.04 kg)  BMI 23.49 kg/m2  SpO2 100% No data found.   Physical Exam   GENERAL: NAD HEENT: no pharyngeal erythema, no exudate, no erythema of TMs, no cervical LAD RESP: CTA B CARD: RRR NEURO: CN II-XII grossly intact, no tremors, no slurred speech, -Rombergs, normal finger to nose and heel to toe walk, patient unable to quantify tingling/numbness on the left side compared to right side with testing  MSK: 5/5 strength of extremities  ED Course  Procedures (including critical care time)  Labs Review Labs Reviewed - No data to display  Imaging Review No results found.   MDM  A/P: Left-sided headache with left-sided paresthesias-given patient's history I would recommend that she have imaging done now instead of in a few days, her elevated blood pressure could be a result of her prednisone or frustration with her symptoms. I called the charge nurse and told her that the patient would be coming for further evaluation and treatment. Patient's husband will take the patient to the ER now.    Paulina Fusi, MD 04/01/16 [phone]

## 2016-04-01 NOTE — ED Notes (Signed)
Patient transported to MRI 

## 2016-04-01 NOTE — ED Provider Notes (Signed)
MR Angiogram Head Wo Contrast (Final result) Result time: 04/01/16 20:30:08   Final result by Rad Results In Interface (04/01/16 20:30:08)   Narrative:   CLINICAL DATA: Left-sided headache. Left-sided tingling and numbness for approximately 4 weeks. Left-sided weakness.  EXAM: MRI HEAD WITHOUT CONTRAST  MRA HEAD WITHOUT CONTRAST  TECHNIQUE: Multiplanar, multiecho pulse sequences of the brain and surrounding structures were obtained without intravenous contrast. Angiographic images of the head were obtained using MRA technique without contrast.  COMPARISON: CT head without contrast 04/05/2015. MRI brain and IAC's 10/20/2012.  FINDINGS: MRI HEAD FINDINGS  No acute infarct, hemorrhage, or mass lesion is present. The ventricles are of normal size. No significant extraaxial fluid collection is present.  Minimal periventricular and subcortical white matter changes bilaterally are likely within normal limits for age. No other significant white matter disease is present.  The internal auditory canals are within normal limits bilaterally. Flow is present in the major intracranial arteries. The globes and orbits are intact. The paranasal sinuses and the mastoid air cells are clear.  The skullbase is within normal limits. Midline sagittal images are unremarkable.  MRA HEAD FINDINGS  The internal carotid arteries are within normal limits from the high cervical segments through the ICA termini bilaterally. The A1 and M1 segments are normal. The anterior communicating artery is patent. The MCA bifurcations are intact. ACA and MCA branch vessels are within normal limits.  The right vertebral artery is the dominant vessel. The right PICA origin is visualized and normal. The left AICA is dominant. The basilar artery is within normal limits. Both posterior cerebral arteries originate from the basilar tip. The PCA branch vessels are within normal limits.  IMPRESSION: 1. Normal  MRI the brain for age. 2. Negative MRA circle of Willis.   Electronically Signed By: San Morelle M.D. On: 04/01/2016 20:30         Discussed with patient and family normal MRI. She reports this is reassuring, and that she is currently under active care by neurologist and [facility]. I will discharge her home, and she will call her neurologist for further recommendations. She is awake alert in no distress and states she is having her baseline chronic migraine pain which she suffers through most every day. She's tried multiple treatments, at this time we will discharge her and she'll contact her neurologist for ongoing treatment recommendations. Return precautions discussed.  Return precautions and treatment recommendations and follow-up discussed with the patient who is agreeable with the plan.   Delman Kitten, MD 04/01/16 2228

## 2016-04-01 NOTE — ED Notes (Signed)
Pt reports migraine headache and numbness left side of body for weeks. Pt sees a neurologist Dr. Tommi Rumps. But 3-4 weeks ago head to toe tingling and numbness hasn't gone away. Pt has MRI scheduled for sat. June 10. Pt takes migraine meds and special med zetra, nasal powder spray, and anti-inflammatory.  Pt said it was worse today and feels sick so came home from work and checked BP and it ws 150s/100s.  Pt also gets botox.

## 2016-04-02 ENCOUNTER — Other Ambulatory Visit: Payer: Self-pay

## 2016-04-02 ENCOUNTER — Encounter: Payer: Self-pay | Admitting: Family Medicine

## 2016-04-02 ENCOUNTER — Ambulatory Visit (INDEPENDENT_AMBULATORY_CARE_PROVIDER_SITE_OTHER): Payer: 59 | Admitting: Family Medicine

## 2016-04-02 VITALS — BP 138/88 | Temp 98.0°F | Resp 16 | Wt 150.0 lb

## 2016-04-02 DIAGNOSIS — G43709 Chronic migraine without aura, not intractable, without status migrainosus: Secondary | ICD-10-CM

## 2016-04-02 DIAGNOSIS — R202 Paresthesia of skin: Secondary | ICD-10-CM | POA: Diagnosis not present

## 2016-04-02 DIAGNOSIS — G43809 Other migraine, not intractable, without status migrainosus: Secondary | ICD-10-CM | POA: Diagnosis not present

## 2016-04-02 DIAGNOSIS — G43719 Chronic migraine without aura, intractable, without status migrainosus: Secondary | ICD-10-CM | POA: Diagnosis not present

## 2016-04-02 DIAGNOSIS — IMO0002 Reserved for concepts with insufficient information to code with codable children: Secondary | ICD-10-CM

## 2016-04-02 DIAGNOSIS — M545 Low back pain, unspecified: Secondary | ICD-10-CM | POA: Insufficient documentation

## 2016-04-02 DIAGNOSIS — G43019 Migraine without aura, intractable, without status migrainosus: Secondary | ICD-10-CM | POA: Diagnosis not present

## 2016-04-02 NOTE — Progress Notes (Deleted)
Patient ID: Rachel Fry, female   DOB: 1956-09-06, 60 y.o.   MRN: [id number]         Patient: Rachel Fry Female    DOB: 1955-12-17   60 y.o.   MRN: [id number] Visit Date: 04/02/2016  Today's Provider: Margarita Rana, MD   Chief Complaint  Patient presents with  . Migraine  . Numbness   Subjective:    Back Pain This is a chronic problem. The current episode started more than 1 month ago. The problem has been gradually improving since onset. The pain is present in the lumbar spine. The quality of the pain is described as aching. The pain does not radiate. The pain is at a severity of 3/10 (Worse when walking; can be as high as 7-8/10.). Pertinent negatives include no abdominal pain, bladder incontinence, bowel incontinence, chest pain, dysuria, fever, headaches, leg pain, numbness, paresis, paresthesias, pelvic pain, perianal numbness, tingling, weakness or weight loss. She has tried NSAIDs for the symptoms. The treatment provided mild relief.   Fibromyalgia: Patient here for follow up on fibromyalgia. Patient has a several years history of soft tissue pain. course over time: well controlled..  Onset was unknown. The symptoms are of intermittent severity. They are made worse by: nothing. They are helped by arthritis medications. The patient denies diagnosis is of longstanding, no ROS questions asked. Previous treatments include prescription meds - see below. Associated symptoms include joint pain.  Evaluation to date includes diagnosis is of longstanding, no recent workup.  Limitation on activities include none. Patient is absent from work about 6 months per month.      Allergies  Allergen Reactions  . Codeine   . Fish Oil Itching  . Sulfa Antibiotics     abdominal pain   Current Meds  Medication Sig  . atorvastatin (LIPITOR) 20 MG tablet TAKE 1 TABLET (20 MG TOTAL) BY MOUTH DAILY.  . botulinum toxin Type A (BOTOX) 100 UNITS SOLR injection Botox - Historical Medication  once  every 3 months for migraines Active  . cetirizine (ZYRTEC) 10 MG tablet Take by mouth.  . diclofenac (VOLTAREN) 50 MG EC tablet Reported on 03/20/2016  . fluticasone (FLOVENT HFA) 110 MCG/ACT inhaler Inhale 2 puffs into the lungs daily.   . frovatriptan (FROVA) 2.5 MG tablet Take 2.5 mg by mouth as needed.   . lansoprazole (PREVACID) 15 MG capsule Take 15 mg by mouth daily.   . meclizine (ANTIVERT) 25 MG tablet Take 1 tablet (25 mg total) by mouth 3 (three) times daily as needed for dizziness.  . SUMAtriptan Succinate (ONZETRA) 11 MG/NOSEPC EXHP Place 22 mg into the nose as needed (for migraine).  . TRIAMCINOLONE ACETONIDE, NASAL STEROIDS, Place 2 sprays into the nose at bedtime.   . Vitamin D, Ergocalciferol, (DRISDOL) 50000 UNITS CAPS capsule Take 50,000 Units by mouth every 30 (thirty) days.   . zafirlukast (ACCOLATE) 10 MG tablet Take 10 mg by mouth 2 (two) times daily.     Review of Systems  Constitutional: Positive for fatigue (Pt does not as much exercise secondary to back pain.). Negative for fever, chills, weight loss, diaphoresis, activity change, appetite change and unexpected weight change.  Respiratory: Negative.   Cardiovascular: Negative.  Negative for chest pain.  Gastrointestinal: Negative.  Negative for abdominal pain and bowel incontinence.  Genitourinary: Negative for bladder incontinence, dysuria and pelvic pain.  Musculoskeletal: Positive for myalgias, back pain, arthralgias and neck stiffness. Negative for joint swelling, gait problem and neck pain.  Neurological: Negative  for dizziness, tingling, weakness, light-headedness, numbness, headaches and paresthesias.    Social History  Substance Use Topics  . Smoking status: Never Smoker   . Smokeless tobacco: Never Used  . Alcohol Use: No   Objective:   BP 138/88 mmHg  Temp(Src) 98 F (36.7 C)  Resp 16  Wt 150 lb (68.04 kg)  Physical Exam      Assessment & Plan:           Margarita Rana, MD  [facility]

## 2016-04-02 NOTE — Progress Notes (Signed)
Patient ID: Rachel Fry, female   DOB: August 05, 1956, 60 y.o.   MRN: [id number]       Patient: Rachel Fry Female    DOB: 12/14/1955   60 y.o.   MRN: [id number] Visit Date: 04/02/2016  Today's Provider: Margarita Rana, MD   Chief Complaint  Patient presents with  . Migraine  . Numbness   Subjective:    HPI Patient comes in today c/o migraine headaches. She reports that this is chronic and she has them daily. Patient is also requesting a referral to Dr. Domingo Cocking in [place] [facility]. Patient reports that her symptoms have been more severe over the last week. She went to the urgent care earlier this week and was prescribed Voltaren which she took for 3 days. Patient reports that it was not helping her symptoms, and now she is currently on a prednisone taper.   Patient also mentions that she has numbness and tingling for the last 3 weeks in her left side. She reports that her symptoms are constant.      Allergies  Allergen Reactions  . Codeine   . Fish Oil Itching  . Sulfa Antibiotics     abdominal pain   Current Meds  Medication Sig  . atorvastatin (LIPITOR) 20 MG tablet TAKE 1 TABLET (20 MG TOTAL) BY MOUTH DAILY.  . botulinum toxin Type A (BOTOX) 100 UNITS SOLR injection Botox - Historical Medication  once every 3 months for migraines Active  . cetirizine (ZYRTEC) 10 MG tablet Take by mouth.  . diclofenac (VOLTAREN) 50 MG EC tablet Reported on 03/20/2016  . fluticasone (FLOVENT HFA) 110 MCG/ACT inhaler Inhale 2 puffs into the lungs daily.   . frovatriptan (FROVA) 2.5 MG tablet Take 2.5 mg by mouth as needed.   . lansoprazole (PREVACID) 15 MG capsule Take 15 mg by mouth daily.   . meclizine (ANTIVERT) 25 MG tablet Take 1 tablet (25 mg total) by mouth 3 (three) times daily as needed for dizziness.  . SUMAtriptan Succinate (ONZETRA) 11 MG/NOSEPC EXHP Place 22 mg into the nose as needed (for migraine).  . TRIAMCINOLONE ACETONIDE, NASAL STEROIDS, Place 2 sprays  into the nose at bedtime.   . Vitamin D, Ergocalciferol, (DRISDOL) 50000 UNITS CAPS capsule Take 50,000 Units by mouth every 30 (thirty) days.   . zafirlukast (ACCOLATE) 10 MG tablet Take 10 mg by mouth 2 (two) times daily.     Review of Systems  Cardiovascular: Negative.   Musculoskeletal: Negative.   Neurological: Positive for weakness, numbness and headaches. Negative for dizziness, tremors, seizures, syncope, facial asymmetry, speech difficulty and light-headedness.    Social History  Substance Use Topics  . Smoking status: Never Smoker   . Smokeless tobacco: Never Used  . Alcohol Use: No   Objective:   BP 138/88 mmHg  Temp(Src) 98 F (36.7 C)  Resp 16  Wt 150 lb (68.04 kg)  Physical Exam  Constitutional: She is oriented to person, place, and time. She appears well-developed and well-nourished.  Musculoskeletal: Normal range of motion.  Neurological: She is alert and oriented to person, place, and time. She displays normal reflexes. No cranial nerve deficit. She exhibits normal muscle tone. Coordination normal.  Brisk reflexes.        Assessment & Plan:     1. Chronic migraine Will refer to new neurology group.  Continue current medication for now.   - Ambulatory referral to Neurosurgery  2. Paresthesia New problem. Will check labs and finish prednisone.  Sed rate may not be accurate. Will check.  Address with neurology.   - B12 - Sed Rate (ESR) - TSH     Patient was seen and examined by Jerrell Belfast, MD, and scribed by Wilburt Finlay, [place].  I have reviewed the document for accuracy and completeness and I agree with above. - Jerrell Belfast, MD   Margarita Rana, MD  [facility]

## 2016-04-03 LAB — VITAMIN B12: Vitamin B-12: 578 pg/mL (ref 211–946)

## 2016-04-03 LAB — TSH: TSH: 1.42 u[IU]/mL (ref 0.450–4.500)

## 2016-04-03 LAB — SEDIMENTATION RATE: Sed Rate: 2 mm/hr (ref 0–40)

## 2016-04-04 ENCOUNTER — Ambulatory Visit (HOSPITAL_COMMUNITY): Admission: RE | Admit: 2016-04-04 | Payer: 59 | Source: Ambulatory Visit

## 2016-04-04 ENCOUNTER — Other Ambulatory Visit: Payer: Self-pay

## 2016-04-04 ENCOUNTER — Ambulatory Visit (HOSPITAL_COMMUNITY): Payer: 59

## 2016-04-04 DIAGNOSIS — E559 Vitamin D deficiency, unspecified: Secondary | ICD-10-CM

## 2016-04-05 MED ORDER — VITAMIN D (ERGOCALCIFEROL) 1.25 MG (50000 UNIT) PO CAPS: ORAL_CAPSULE | ORAL | Status: DC

## 2016-04-06 ENCOUNTER — Telehealth: Payer: Self-pay

## 2016-04-06 NOTE — Telephone Encounter (Signed)
-----   Message from Margarita Rana, MD sent at 04/03/2016 10:47 AM EDT ----- Normal labs. Thanks.

## 2016-04-06 NOTE — Telephone Encounter (Signed)
Patient advised as below.  

## 2016-04-07 ENCOUNTER — Encounter: Payer: Self-pay | Admitting: Neurology

## 2016-04-07 ENCOUNTER — Ambulatory Visit (INDEPENDENT_AMBULATORY_CARE_PROVIDER_SITE_OTHER): Payer: 59 | Admitting: Neurology

## 2016-04-07 VITALS — BP 141/85 | HR 79 | Ht 67.0 in | Wt 152.0 lb

## 2016-04-07 DIAGNOSIS — R209 Unspecified disturbances of skin sensation: Secondary | ICD-10-CM | POA: Diagnosis not present

## 2016-04-07 DIAGNOSIS — IMO0001 Reserved for inherently not codable concepts without codable children: Secondary | ICD-10-CM

## 2016-04-07 DIAGNOSIS — M47812 Spondylosis without myelopathy or radiculopathy, cervical region: Secondary | ICD-10-CM | POA: Diagnosis not present

## 2016-04-07 DIAGNOSIS — M6289 Other specified disorders of muscle: Secondary | ICD-10-CM | POA: Diagnosis not present

## 2016-04-07 DIAGNOSIS — R531 Weakness: Secondary | ICD-10-CM

## 2016-04-07 DIAGNOSIS — M542 Cervicalgia: Secondary | ICD-10-CM | POA: Diagnosis not present

## 2016-04-07 MED ORDER — ALPRAZOLAM 0.5 MG PO TABS: 0.5000 mg | ORAL_TABLET | Freq: Three times a day (TID) | ORAL | Status: DC | PRN

## 2016-04-07 NOTE — Progress Notes (Addendum)
[facility]    Provider:  Dr [name] Referring Provider: Margarita Rana, MD Primary Care Physician:  Margarita Rana, MD  CC:  Migraines, acute onset left-sided sensory symptoms and weakness  HPI:  Rachel Fry is a 60 y.o. female here as a referral from Dr. Venia Minks for migraines. Past medical history of high cholesterol, migraine, fibromyalgia, pre-diabetes. In 1993 she had a right shoulder injury with chronic pain that radiates to the head, triggers migraines left-sided migraines. She has done trigger, dry needling, She has pain "head to toe" if you just touch her lightly it hurts everywhere, like you touch a bruise, literally everywhere, headache is always there too. She has balance problems. She has right ear diagnosed vestibular problem.  On the left side, she feels tingling, electric, pulsating, like a TENs unit on her left side.  She has "zinging" on the left side of her body. This is new. She is on a prednisone taper right now. Weakness on the left side as well. Acute onset and progressively worsening. She also has a heaviness in her left eye and vision is blurry. Her headaches are on the left side. Feels like an ache on her left side of her face, continuous and daily. Light and sound sensitivity has never been much of an issue with her daily headaches. She has trigger injections with Dr. Domingo Cocking. She is off balance. She got out of bed one morning and couldn't walk straight thought it was her migraines, saw ENT. She has daily headaches. Dan Europe was tried before the vestibular migraine started so unsure if it caused it. The last 6 weeks the dizziness is worse. Feels like she is on a boat. She is having episodes of spinning. Her let leg is heavy. She hs a long history of migraines and records from headache wellness are extensive, need to review. Here with her husband who also provides information. Reports no other inciting events, no head trauma, no falls. The right side of her  body is fine since his everything is on the left side.  Reviewed notes, labs and imaging from outside physicians, which showed:   MRI of the brain and MRA of the head images were personally reviewed and also reviewed with patient and husband, agree with the following:  MRI HEAD FINDINGS  No acute infarct, hemorrhage, or mass lesion is present. The ventricles are of normal size. No significant extraaxial fluid collection is present.  Minimal periventricular and subcortical white matter changes bilaterally are likely within normal limits for age. No other significant white matter disease is present.  The internal auditory canals are within normal limits bilaterally. Flow is present in the major intracranial arteries. The globes and orbits are intact. The paranasal sinuses and the mastoid air cells are clear.  The skullbase is within normal limits. Midline sagittal images are unremarkable.  MRA HEAD FINDINGS  The internal carotid arteries are within normal limits from the high cervical segments through the ICA termini bilaterally. The A1 and M1 segments are normal. The anterior communicating artery is patent. The MCA bifurcations are intact. ACA and MCA branch vessels are within normal limits.  The right vertebral artery is the dominant vessel. The right PICA origin is visualized and normal. The left AICA is dominant. The basilar artery is within normal limits. Both posterior cerebral arteries originate from the basilar tip. The PCA branch vessels are within normal limits.  IMPRESSION: 1. Normal MRI the brain for age. 2. Negative MRA circle of Willis.   B12  578, sedimentation rate within normal limits, TSH within normal limits, CBC with elevated white blood cells likely due to oral steroids, CMP normal, last hemoglobin A1c in July 2016 abnormal 6.0  Abnormal  Review of Systems: Patient complains of symptoms per HPI as well as the following symptoms: Blurred vision, spinning  sensation, headache, numbness, weakness. Pertinent negatives per HPI. All others negative.   Social History   Social History  . Marital Status: Married    Spouse Name: Rachel Fry  . Number of Children: 3  . Years of Education: 16   Occupational History  . Cardiab rehab    Social History Main Topics  . Smoking status: Never Smoker   . Smokeless tobacco: Never Used  . Alcohol Use: No  . Drug Use: No  . Sexual Activity: Not on file   Other Topics Concern  . Not on file   Social History Narrative   Lives with husband   Caffeine use: none    Family History  Problem Relation Age of Onset  . Hypertension Mother   . Breast cancer Mother 56  . Thyroid disease Mother   . Stroke Mother   . Dementia Mother   . Cardiomyopathy Father   . Peripheral Artery Disease Father   . Hyperlipidemia Brother   . Hypertension Brother   . Colon cancer Maternal Grandmother   . Stroke Paternal Grandfather   . Hypertension Brother   . Hyperlipidemia Brother   . Thyroid disease Brother   . Hypertension Brother   . Birth defects Brother   . Congenital heart disease Brother   . Cancer Brother     Kidney cancer  . Neuropathy Neg Hx   . Multiple sclerosis Neg Hx   . Migraines Neg Hx     Past Medical History  Diagnosis Date  . Allergy   . Migraines   . Numbness and tingling     left side of body, occasionally right side   . S/P Botox injection     Past Surgical History  Procedure Laterality Date  . Tubal ligation  1988  . Cesarean section    . Tonsillectomy and adenoidectomy    . Arm neuroplasty Right 1994 and 1996    for chronic pain  . Ovarian cyst removal  1973    Current Outpatient Prescriptions  Medication Sig Dispense Refill  . atorvastatin (LIPITOR) 20 MG tablet TAKE 1 TABLET (20 MG TOTAL) BY MOUTH DAILY. 90 tablet 1  . botulinum toxin Type A (BOTOX) 100 UNITS SOLR injection Botox - Historical Medication  once every 3 months for migraines Active    . cetirizine (ZYRTEC) 10  MG tablet Take by mouth.    . diclofenac (VOLTAREN) 50 MG EC tablet Reported on 03/20/2016  0  . fluticasone (FLOVENT HFA) 110 MCG/ACT inhaler Inhale 2 puffs into the lungs daily.     . frovatriptan (FROVA) 2.5 MG tablet Take 2.5 mg by mouth as needed.     . lansoprazole (PREVACID) 15 MG capsule Take 15 mg by mouth daily.     . meclizine (ANTIVERT) 25 MG tablet Take 1 tablet (25 mg total) by mouth 3 (three) times daily as needed for dizziness. 21 tablet 0  . TRIAMCINOLONE ACETONIDE, NASAL STEROIDS, Place 2 sprays into the nose at bedtime.     . Vitamin D, Ergocalciferol, (DRISDOL) 50000 units CAPS capsule Take one capsule every other week. 6 capsule 1  . zafirlukast (ACCOLATE) 10 MG tablet Take 10 mg by mouth 2 (two) times  daily.     Marland Kitchen ALPRAZolam (XANAX) 0.5 MG tablet Take 1 tablet (0.5 mg total) by mouth 3 (three) times daily as needed for anxiety. 60 tablet 5   No current facility-administered medications for this visit.    Allergies as of 04/07/2016 - Review Complete 04/07/2016  Allergen Reaction Noted  . Codeine  02/25/2015  . Fish oil Itching 02/25/2015  . Sulfa antibiotics  02/25/2015    Vitals: BP 141/85 mmHg  Pulse 79  Ht 5\' 7"  (1.702 m)  Wt 152 lb (68.947 kg)  BMI 23.80 kg/m2 Last Weight:  Wt Readings from Last 1 Encounters:  04/07/16 152 lb (68.947 kg)   Last Height:   Ht Readings from Last 1 Encounters:  04/07/16 5\' 7"  (1.702 m)   Physical exam: Exam: Gen: NAD, conversant, well nourised, obese, well groomed                     CV: RRR, no MRG. No Carotid Bruits. No peripheral edema, warm, nontender Eyes: Conjunctivae clear without exudates or hemorrhage  Neuro: Detailed Neurologic Exam  Speech:    Speech is normal; fluent and spontaneous with normal comprehension.  Cognition:    The patient is oriented to person, place, and time;     recent and remote memory intact;     language fluent;     normal attention, concentration,     fund of knowledge Cranial  Nerves:    The pupils are equal, round, and reactive to light. The fundi are normal and spontaneous venous pulsations are present. Visual fields are full to finger confrontation. Extraocular movements are intact. Trigeminal sensation is intact and the muscles of mastication are normal. The face is symmetric. The palate elevates in the midline. Hearing intact. Voice is normal. Shoulder shrug is normal. The tongue has normal motion without fasciculations.   Coordination:    Normal finger to nose and heel to shin. Normal rapid alternating movements.   Gait:    Difficulty with Heel-toe and tandem gait.   Motor Observation:    No asymmetry, no atrophy, and no involuntary movements noted. Tone:    Normal muscle tone.    Posture:    Posture is normal. normal erect    Strength: Bilat deltoid weakness 4/5, left triceps 3+/5, left hip and leg flexion 4+/5, left dorsiflexion 4+/5    Otherwise Strength is V/V in the upper and lower limbs.      Sensation: LT hemisensory loss.     Reflex Exam:  DTR's:     Deep tendon reflexes in the upper and lower extremities are normal bilaterally.   Toes:    The toes are downgoing bilaterally.   Clonus:    Clonus is absent.     Assessment/Plan:  60 year old female with a significant history of migraines, dizziness and vestibular symptoms who presents with acute onset of left sided weakness, sensory symptoms. Exam significant for left-sided weakness and hemisensory loss. MRI of the brain and MRA of the head were unremarkable. We'll check an MRI of the cervical spine for any etiology of the cord such as myelopathy or transverse myelitis. Otherwise this may be a phenomenon of her migraines. Today we received significant documentation from the [facility], this will be reviewed and documented in an addendum. Provided patient with Xanax as this is a very good vestibular suppressant.  (Addendum: Jill Poling M.D. Follow-up on dizziness. Audiology  interpretation and vestibular tests were performed. Still having daily dizziness. Severity varies. Some  nausea with episodes no vomiting. Doing vestibular exercises at home. The ENG showed no cord weakness and no positional vertigo. There was small, post head shake nystagmus, which is nonspecific, but could point to central causes. She does have a history of migraines, an MRI and MRA were otherwise unremarkable. Periodic tingling left-sided body. Audiogram was unremarkable. Nothing on EMG or audiogram to explain her dizziness in terms of a primary vestibulopathy. Migraine-related dizziness might be considered and manage further. Continue home vestibular exercises.)  To prevent or relieve headaches, try the following: Cool Compress. Lie down and place a cool compress on your head.  Avoid headache triggers. If certain foods or odors seem to have triggered your migraines in the past, avoid them. A headache diary might help you identify triggers.  Include physical activity in your daily routine. Try a daily walk or other moderate aerobic exercise.  Manage stress. Find healthy ways to cope with the stressors, such as delegating tasks on your to-do list.  Practice relaxation techniques. Try deep breathing, yoga, massage and visualization.  Eat regularly. Eating regularly scheduled meals and maintaining a healthy diet might help prevent headaches. Also, drink plenty of fluids.  Follow a regular sleep schedule. Sleep deprivation might contribute to headaches Consider biofeedback. With this mind-body technique, you learn to control certain bodily functions - such as muscle tension, heart rate and blood pressure - to prevent headaches or reduce headache pain.    Proceed to emergency room if you experience new or worsening symptoms or symptoms do not resolve, if you have new neurologic symptoms or if headache is severe, or for any concerning symptom.   Reviewed extensive notes from the [facility]:  She describes severe head pain. Located a retro-orbital left temporal duration of the headache is all day. Pain is described as throbbing, sharp and stabbing. Nausea is present, photophobia is present, vomiting is absent, phonophobia is absent, osmophobia is absent and dizziness is absent. Autonomic symptoms are absent. Other symptoms include tingling sensations. Prodrome is absent. No aura is present. Headache triggers include emotional factors, stress and exposure to environmental hazards.  Past headache related medications: Analgesics: Aspirin, codeine, propoxyphene and Tylenol antimigraine: Cambio, Frova, Imitrex tablet, Migranal and Imitrex nasal powder   decongestants and antihistamines: Allegra, Benadryl, Claritin, Dramamine, and time, Flonase, Nasonex, pseudoephedrine Zyrtec Antinausea: Promethazine NSAIDs: Aleve, Celebrex, ibuprofen, indomethacin and Naprosyn Muscle relaxants: Baclofen, Flexeril, Robaxin and Skelaxin Anticonvulsants: Depakote, gabapentin, Lyrica and topiramate Steroids: Prednisone   sleeping pills and tranquilizers: Alprazolam and perphenazine Antidepressants: Cymbalta and Lexapro herbal, hormonal: None Procedure therapies: Botox, nerve blocks, and trigger point injections. Neuromuscular therapy including dry needling with Pershing Cox Hypertensives: atenolol and verapamil  Diagnosis chronic migraine with status migrainosus, migraine headache without aura, supraorbital neuralgia, occipital neuralgia. Neurologic exam remains normal.     Sarina Ill, MD  [facility] [address] [place], [place] [address]  Phone [phone] Fax [phone]

## 2016-04-07 NOTE — Patient Instructions (Signed)
Remember to drink plenty of fluid, eat healthy meals and do not skip any meals. Try to eat protein with a every meal and eat a healthy snack such as fruit or nuts in between meals. Try to keep a regular sleep-wake schedule and try to exercise daily, particularly in the form of walking, 20-30 minutes a day, if you can.   As far as your medications are concerned, I would like to suggest: xanax 1/2 to a whole tablet up to 3x a day  As far as diagnostic testing: MRI cervical spine   Our phone number is [phone]. We also have an after hours call service for urgent matters and there is a physician on-call for urgent questions. For any emergencies you know to call 911 or go to the nearest emergency room

## 2016-04-08 ENCOUNTER — Encounter: Payer: Self-pay | Admitting: Neurology

## 2016-04-09 ENCOUNTER — Other Ambulatory Visit: Payer: Self-pay

## 2016-04-09 DIAGNOSIS — E559 Vitamin D deficiency, unspecified: Secondary | ICD-10-CM

## 2016-04-09 MED ORDER — VITAMIN D (ERGOCALCIFEROL) 1.25 MG (50000 UNIT) PO CAPS: ORAL_CAPSULE | ORAL | Status: DC

## 2016-04-10 ENCOUNTER — Telehealth: Payer: Self-pay | Admitting: Neurology

## 2016-04-10 NOTE — Telephone Encounter (Signed)
Pt called to schedule MRI. Danielle advised operator that pt was [employer] and she could call [facility] and schedule it since the order was in [place]. Pt was appreciative and sts she will call and schedule it.

## 2016-04-15 ENCOUNTER — Ambulatory Visit (HOSPITAL_COMMUNITY)
Admission: RE | Admit: 2016-04-15 | Discharge: 2016-04-15 | Disposition: A | Discharge status: Home / Self Care | Payer: 59 | Source: Ambulatory Visit | Attending: Neurology | Admitting: Neurology

## 2016-04-15 ENCOUNTER — Encounter: Payer: Self-pay | Admitting: Neurology

## 2016-04-15 DIAGNOSIS — M542 Cervicalgia: Secondary | ICD-10-CM | POA: Diagnosis not present

## 2016-04-15 DIAGNOSIS — M50221 Other cervical disc displacement at C4-C5 level: Secondary | ICD-10-CM | POA: Insufficient documentation

## 2016-04-15 DIAGNOSIS — M50321 Other cervical disc degeneration at C4-C5 level: Secondary | ICD-10-CM | POA: Insufficient documentation

## 2016-04-15 DIAGNOSIS — M6289 Other specified disorders of muscle: Secondary | ICD-10-CM | POA: Diagnosis not present

## 2016-04-15 DIAGNOSIS — M47812 Spondylosis without myelopathy or radiculopathy, cervical region: Secondary | ICD-10-CM | POA: Insufficient documentation

## 2016-04-15 DIAGNOSIS — R531 Weakness: Secondary | ICD-10-CM

## 2016-04-15 DIAGNOSIS — R209 Unspecified disturbances of skin sensation: Secondary | ICD-10-CM | POA: Insufficient documentation

## 2016-04-15 DIAGNOSIS — IMO0001 Reserved for inherently not codable concepts without codable children: Secondary | ICD-10-CM

## 2016-04-16 ENCOUNTER — Telehealth: Payer: Self-pay | Admitting: *Deleted

## 2016-04-16 ENCOUNTER — Other Ambulatory Visit: Payer: Self-pay | Admitting: Neurology

## 2016-04-16 DIAGNOSIS — R110 Nausea: Secondary | ICD-10-CM

## 2016-04-16 MED ORDER — ONDANSETRON 4 MG PO TBDP: 4.0000 mg | ORAL_TABLET | Freq: Three times a day (TID) | ORAL | Status: DC | PRN

## 2016-04-16 NOTE — Telephone Encounter (Addendum)
Dr Hoyle Barr pt back. Went over MRI results per Dr [name] note. She verbalized understanding.  She stated she had a headache yesterday that was a "5 or 6/10" on pain scale. She took a frova yesterday and it helped. She took a second dose at 6am this morning. Her headache is starting to come back. Advised Dr [name] is going to call in some zofran for her increased nausea. Per Dr [name], she will send her a mychart message to discuss further and no need to make f/u at this time.  She verbalized understanding.

## 2016-04-16 NOTE — Telephone Encounter (Signed)
-----   Message from Melvenia Beam, MD sent at 04/16/2016  7:36 AM EDT ----- MRI of the cervical spine essential normal, she has some very mild arthritic changes expected for age. The spinal cord is normal

## 2016-04-17 ENCOUNTER — Other Ambulatory Visit: Payer: 59

## 2016-04-17 ENCOUNTER — Ambulatory Visit: Payer: 59

## 2016-04-17 DIAGNOSIS — H93293 Other abnormal auditory perceptions, bilateral: Secondary | ICD-10-CM | POA: Diagnosis not present

## 2016-04-17 DIAGNOSIS — R42 Dizziness and giddiness: Secondary | ICD-10-CM | POA: Diagnosis not present

## 2016-04-23 DIAGNOSIS — R42 Dizziness and giddiness: Secondary | ICD-10-CM | POA: Diagnosis not present

## 2016-04-30 DIAGNOSIS — R51 Headache: Secondary | ICD-10-CM | POA: Diagnosis not present

## 2016-04-30 DIAGNOSIS — M791 Myalgia: Secondary | ICD-10-CM | POA: Diagnosis not present

## 2016-04-30 DIAGNOSIS — G43019 Migraine without aura, intractable, without status migrainosus: Secondary | ICD-10-CM | POA: Diagnosis not present

## 2016-04-30 DIAGNOSIS — M542 Cervicalgia: Secondary | ICD-10-CM | POA: Diagnosis not present

## 2016-04-30 DIAGNOSIS — G43719 Chronic migraine without aura, intractable, without status migrainosus: Secondary | ICD-10-CM | POA: Diagnosis not present

## 2016-05-07 DIAGNOSIS — R42 Dizziness and giddiness: Secondary | ICD-10-CM | POA: Diagnosis not present

## 2016-05-28 ENCOUNTER — Ambulatory Visit: Payer: 59 | Attending: Specialist | Admitting: Physical Therapy

## 2016-05-28 DIAGNOSIS — M6281 Muscle weakness (generalized): Secondary | ICD-10-CM | POA: Insufficient documentation

## 2016-06-04 DIAGNOSIS — R51 Headache: Secondary | ICD-10-CM | POA: Diagnosis not present

## 2016-06-04 DIAGNOSIS — M542 Cervicalgia: Secondary | ICD-10-CM | POA: Diagnosis not present

## 2016-06-04 DIAGNOSIS — G43719 Chronic migraine without aura, intractable, without status migrainosus: Secondary | ICD-10-CM | POA: Diagnosis not present

## 2016-06-04 DIAGNOSIS — G43019 Migraine without aura, intractable, without status migrainosus: Secondary | ICD-10-CM | POA: Diagnosis not present

## 2016-06-04 DIAGNOSIS — M791 Myalgia: Secondary | ICD-10-CM | POA: Diagnosis not present

## 2016-06-23 ENCOUNTER — Ambulatory Visit: Payer: 59 | Admitting: Physical Therapy

## 2016-06-23 DIAGNOSIS — M6281 Muscle weakness (generalized): Secondary | ICD-10-CM | POA: Diagnosis not present

## 2016-06-23 NOTE — Therapy (Signed)
[facility] [address] [place], [address] Phone: [phone]   Fax:  [phone]  Physical Therapy Evaluation  Patient Details  Name: Rachel Fry MRN: [id number] Date of Birth: December 11, 1955 Referring Provider: Orie Rout  Encounter Date: 06/23/2016      PT End of Session - 06/23/16 1616    Visit Number 1   Number of Visits 7   Date for PT Re-Evaluation 08/04/16   PT Start Time [id number]   PT Stop Time 1600   PT Time Calculation (min) 45 min   Activity Tolerance Patient tolerated treatment well      Past Medical History:  Diagnosis Date  . Allergy   . Migraines   . Numbness and tingling    left side of body, occasionally right side   . S/P Botox injection     Past Surgical History:  Procedure Laterality Date  . ARM NEUROPLASTY Right 1994 and 1996   for chronic pain  . CESAREAN SECTION    . OVARIAN CYST REMOVAL  1973  . TONSILLECTOMY AND ADENOIDECTOMY    . TUBAL LIGATION  1988    There were no vitals filed for this visit.       Subjective Assessment - 06/23/16 1520    Subjective (P)  Pt has had 8 yrs of chronic migraines, L temporal, daily, mild, 1-4/10 pain. May 2017 began having tingling on L side head to toe, this does not abate. HA concommitant and Nausea with this. Pt is having some vision change with this as well. She has spoken with her migraine MD regarding this, pt tried a short course of voltaren which did not improve it. Pt does get botox and trigger point injections which have not improved symptoms. Pt has also had an MRI recently which was negative. Seen by neurologist, negative MRI for neck.   Diagnostic tests (P)  negative MRI for cervical, cranial regions.             [facility] PT Assessment - 06/23/16 0001      Assessment   Medical Diagnosis Chronic migraine   Referring Provider Orie Rout   Prior Therapy extensive chiropractic, massage therapy, trigger point  injections, botox     Precautions   Precautions None     Balance Screen   Has the patient fallen in the past 6 months No   Has the patient had a decrease in activity level because of a fear of falling?  No   Is the patient reluctant to leave their home because of a fear of falling?  No     Home Ecologist residence     Prior Function   Level of Independence Independent   Vocation Full time employment   Vocation Requirements bending, lifting, sitting     Sensation   Light Touch Appears Intact   Additional Comments pt reports N/T globally on L side     Posture/Postural Control   Posture Comments FHP     ROM / Strength   AROM / PROM / Strength AROM;Strength     AROM   Overall AROM Comments decr. upper cervical flexion, poor cervical control with AROM     Strength   Overall Strength Comments impaired DNF, only able to hold 3 sec.      Palpation   Spinal mobility generally hypomobile from T1-T3   Palpation comment hypertonic suboccipitals, levator, UT  Objective: Focus of session on exploring and correcting straight leg raise and heel slide.  Pt required manual cuing, verbal cuing, breathing cues. Unable to achieve L side feeling like R side but able to make it feel more similar, enough to work on this on her own at home. Issued this as HEP.                 PT Education - 06/23/16 1614    Education provided Yes   Education Details educated pt on HEP/chronic pain   Person(s) Educated Patient   Methods Explanation   Comprehension Verbalized understanding             PT Long Term Goals - 06/23/16 1625      PT LONG TERM GOAL #1   Title Pt will improve L sided pelvic control as seen by ability to perform lunge = B   Baseline unable to perform on L   Time 6   Period Weeks   Status New     PT LONG TERM GOAL #2   Title Pt will be able to perform SLR on L demonstrating improved motor pattern globally to  decr. excess hypercontrol from cervical spine   Time 6   Period Weeks   Status New     PT LONG TERM GOAL #3   Title Pt will verbalize TNE principles for addressing chronic pain   Period Weeks   Status New               Plan - 06/23/16 1617    Clinical Impression Statement Pt is a pleasant 60 y/o female with c/o chronic migraines, N/T on L side, "sluggishness" and "heaviness" on L side. Pt demonstrates poor motor control generally on L side which she is unable to correct on her own. Also notes significant high threshold response to low level exercises. Pt would benefit from skilled PT to address these issues and allow return to PLOF   Rehab Potential Fair   Clinical Impairments Affecting Rehab Potential chronic pain, motivation, multiple treatment modalities   PT Frequency 1x / week   PT Duration 6 weeks   PT Treatment/Interventions ADLs/Self Care Home Management;Dry needling;Manual techniques;Therapeutic exercise;Therapeutic activities;Neuromuscular re-education;Aquatic Therapy   Consulted and Agree with Plan of Care Patient      Patient will benefit from skilled therapeutic intervention in order to improve the following deficits and impairments:  Pain, Decreased strength, Decreased endurance, Decreased activity tolerance, Decreased balance  Visit Diagnosis: Muscle weakness (generalized) - Plan: PT plan of care cert/re-cert     Problem List Patient Active Problem List   Diagnosis Date Noted  . Lower back pain 04/02/2016  . Acute stress disorder 02/25/2015  . Anxiety 02/25/2015  . Airway hyperreactivity 02/25/2015  . Bradycardia 02/25/2015  . Hypersomnia 02/25/2015  . Clinical depression 02/25/2015  . Elevated blood sugar 02/25/2015  . 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 Acid reflux 02/25/2015  . 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 H/O disease 02/25/2015  . Hypercholesteremia 02/25/2015  . Headache, migraine 02/25/2015  . Muscle ache 02/25/2015  . Allergic rhinitis,  seasonal 02/25/2015  . Avitaminosis D 02/25/2015    Kylyn Sookram PT DPT 06/23/2016, 4:36 PM  [facility] [address], [place], [address] Phone: [phone]   Fax:  [phone]  Name: Rachel Fry MRN: [id number] Date of Birth: September 21, 1956

## 2016-06-24 DIAGNOSIS — G43719 Chronic migraine without aura, intractable, without status migrainosus: Secondary | ICD-10-CM | POA: Diagnosis not present

## 2016-06-24 DIAGNOSIS — G43019 Migraine without aura, intractable, without status migrainosus: Secondary | ICD-10-CM | POA: Diagnosis not present

## 2016-06-30 ENCOUNTER — Encounter: Payer: 59 | Admitting: Physical Therapy

## 2016-07-07 ENCOUNTER — Ambulatory Visit: Payer: 59 | Attending: Specialist | Admitting: Physical Therapy

## 2016-07-07 DIAGNOSIS — M542 Cervicalgia: Secondary | ICD-10-CM | POA: Insufficient documentation

## 2016-07-07 DIAGNOSIS — M6281 Muscle weakness (generalized): Secondary | ICD-10-CM | POA: Insufficient documentation

## 2016-07-07 NOTE — Therapy (Signed)
[facility] [address] [place], [address] Phone: [phone]   Fax:  [phone]  Physical Therapy Treatment  Patient Details  Name: Rachel Fry MRN: [id number] Date of Birth: [birth year] Referring Provider: Orie Rout  Encounter Date: 07/07/2016      PT End of Session - 07/07/16 1528    Visit Number 2   Number of Visits 7   Date for PT Re-Evaluation 08/04/16   PT Start Time [id number]   PT Stop Time 1525   PT Time Calculation (min) 40 min   Activity Tolerance Patient tolerated treatment well      Past Medical History:  Diagnosis Date  . Allergy   . Migraines   . Numbness and tingling    left side of body, occasionally right side   . S/P Botox injection     Past Surgical History:  Procedure Laterality Date  . ARM NEUROPLASTY Right 1994 and 1996   for chronic pain  . CESAREAN SECTION    . OVARIAN CYST REMOVAL  1973  . TONSILLECTOMY AND ADENOIDECTOMY    . TUBAL LIGATION  1988    There were no vitals filed for this visit.      Subjective Assessment - 07/07/16 1447    Subjective (P)  Pt has been consistent with HEP, she is noticing no difficulty with them at this time. Is noticing no change in her symptoms since that time.   Diagnostic tests (P)  negative MRI for cervical, cranial regions.    Currently in Pain? (P)  Yes   Pain Score (P)  2    Pain Location (P)  Face           Objective:                       PT Education - 07/07/16 1528    Education provided Yes   Education Details Progression of HEP   Person(s) Educated Patient   Methods Explanation   Comprehension Verbalized understanding             PT Long Term Goals - 06/23/16 1625      PT LONG TERM GOAL #1   Title Pt will improve L sided pelvic control as seen by ability to perform lunge = B   Baseline unable to perform on L   Time 6   Period Weeks   Status New     PT LONG TERM GOAL  #2   Title Pt will be able to perform SLR on L demonstrating improved motor pattern globally to decr. excess hypercontrol from cervical spine   Time 6   Period Weeks   Status New     PT LONG TERM GOAL #3   Title Pt will verbalize TNE principles for addressing chronic pain   Period Weeks   Status New               Plan - 07/07/16 1528    Clinical Impression Statement Noted signfiicant tenderness in L UT and 1st rib hypomobility which may be related to chronic neck/HA pain. Pt also cnotinues to have difficulty with disassociation and would benefit from further PT to address this. Encouraged pt to mention 1st rib to her massage therapist for further followup.   Rehab Potential Fair   Clinical Impairments Affecting Rehab Potential chronic pain, motivation, multiple treatment modalities   PT Frequency 1x / week   PT Duration 6 weeks  PT Treatment/Interventions ADLs/Self Care Home Management;Dry needling;Manual techniques;Therapeutic exercise;Therapeutic activities;Neuromuscular re-education;Aquatic Therapy   Consulted and Agree with Plan of Care Patient      Patient will benefit from skilled therapeutic intervention in order to improve the following deficits and impairments:  Pain, Decreased strength, Decreased endurance, Decreased activity tolerance, Decreased balance  Visit Diagnosis: Muscle weakness (generalized)     Problem List Patient Active Problem List   Diagnosis Date Noted  . Lower back pain 04/02/2016  . Acute stress disorder 02/25/2015  . Anxiety 02/25/2015  . Airway hyperreactivity 02/25/2015  . Bradycardia 02/25/2015  . Hypersomnia 02/25/2015  . Clinical depression 02/25/2015  . Elevated blood sugar 02/25/2015  . 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 Acid reflux 02/25/2015  . 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 H/O disease 02/25/2015  . Hypercholesteremia 02/25/2015  . Headache, migraine 02/25/2015  . Muscle ache 02/25/2015  . Allergic rhinitis, seasonal  02/25/2015  . Avitaminosis D 02/25/2015    Fisher,Benjamin PT DPT 07/07/2016, 3:30 PM  [facility] [address], [place], [address] Phone: [phone]   Fax:  [phone]  Name: Rachel Fry MRN: [id number] Date of Birth: [birth year]

## 2016-07-08 DIAGNOSIS — H2513 Age-related nuclear cataract, bilateral: Secondary | ICD-10-CM | POA: Diagnosis not present

## 2016-07-14 ENCOUNTER — Ambulatory Visit: Payer: 59 | Admitting: Physical Therapy

## 2016-07-14 DIAGNOSIS — M6281 Muscle weakness (generalized): Secondary | ICD-10-CM

## 2016-07-14 DIAGNOSIS — M542 Cervicalgia: Secondary | ICD-10-CM | POA: Diagnosis not present

## 2016-07-15 NOTE — Therapy (Signed)
[facility] [address], [place], [address] Phone: [phone]   Fax:  [phone]  Physical Therapy Treatment  Patient Details  Name: Rachel Fry MRN: [id number] Date of Birth: November 23, 1955 Referring Provider: Orie Rout  Encounter Date: 07/14/2016      PT End of Session - 07/14/16 0710    Visit Number 3   Number of Visits 7   Date for PT Re-Evaluation 08/04/16   PT Start Time 1330   PT Stop Time 1400   PT Time Calculation (min) 30 min   Activity Tolerance Patient tolerated treatment well      Past Medical History:  Diagnosis Date  . Allergy   . Migraines   . Numbness and tingling    left side of body, occasionally right side   . S/P Botox injection     Past Surgical History:  Procedure Laterality Date  . ARM NEUROPLASTY Right 1994 and 1996   for chronic pain  . CESAREAN SECTION    . OVARIAN CYST REMOVAL  1973  . TONSILLECTOMY AND ADENOIDECTOMY    . TUBAL LIGATION  1988    There were no vitals filed for this visit.      Subjective Assessment - 07/15/16 0709    Subjective Pt continues to notice no change in symptoms, is having a "zinging" today on her L side.   Diagnostic tests negative MRI for cervical, cranial regions.    Currently in Pain? Yes   Pain Score 4    Pain Orientation Left               Objective: TNE education focusing on sensory cortex, smudging, disassociation, R/L differentiation as mechanism for implicit motor imagery.  Utilized [employer] training R/L UE differentiation, as expected pt had difficulty recognizing R side, by approximatly one second difference compared with L. Pt will download app and will perform vanilla setting test 3x per day.  Performed limited step lunge, noted dificulty when stepping forward onto L but will focus on this over the next few days as well, cuing necessary to shorten step length to improve motor  control.                  PT Education - 07/15/16 0710    Education provided Yes   Education Details graded motor imagery   Person(s) Educated Patient   Methods Explanation   Comprehension Verbalized understanding             PT Long Term Goals - 06/23/16 1625      PT LONG TERM GOAL #1   Title Pt will improve L sided pelvic control as seen by ability to perform lunge = B   Baseline unable to perform on L   Time 6   Period Weeks   Status New     PT LONG TERM GOAL #2   Title Pt will be able to perform SLR on L demonstrating improved motor pattern globally to decr. excess hypercontrol from cervical spine   Time 6   Period Weeks   Status New     PT LONG TERM GOAL #3   Title Pt will verbalize TNE principles for addressing chronic pain   Period Weeks   Status New               Plan - 07/15/16 0710    Clinical Impression Statement Focused session on continued TNE and progression from dissassociation techniques to graded motor  imagery, both explicit and implicit. Unclear if this will have an effect on pt but she is willing to give it a try and is going to work on both the lunge for disassociation and the recognise app at home to assess. Continues to have chronic pain.   Rehab Potential Fair   Clinical Impairments Affecting Rehab Potential chronic pain, motivation, multiple treatment modalities   PT Frequency 1x / week   PT Duration 6 weeks   PT Treatment/Interventions ADLs/Self Care Home Management;Dry needling;Manual techniques;Therapeutic exercise;Therapeutic activities;Neuromuscular re-education;Aquatic Therapy   Consulted and Agree with Plan of Care Patient      Patient will benefit from skilled therapeutic intervention in order to improve the following deficits and impairments:  Pain, Decreased strength, Decreased endurance, Decreased activity tolerance, Decreased balance  Visit Diagnosis: Muscle weakness (generalized)     Problem  List Patient Active Problem List   Diagnosis Date Noted  . Lower back pain 04/02/2016  . Acute stress disorder 02/25/2015  . Anxiety 02/25/2015  . Airway hyperreactivity 02/25/2015  . Bradycardia 02/25/2015  . Hypersomnia 02/25/2015  . Clinical depression 02/25/2015  . Elevated blood sugar 02/25/2015  . 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 Acid reflux 02/25/2015  . 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 H/O disease 02/25/2015  . Hypercholesteremia 02/25/2015  . Headache, migraine 02/25/2015  . Muscle ache 02/25/2015  . Allergic rhinitis, seasonal 02/25/2015  . Avitaminosis D 02/25/2015    Fisher,Benjamin 07/15/2016, 7:13 AM  [facility] [address], [place], [address] Phone: [phone]   Fax:  [phone]  Name: LIELA ALDERS MRN: [id number] Date of Birth: August 23, 1956

## 2016-07-21 ENCOUNTER — Ambulatory Visit: Payer: 59 | Admitting: Physical Therapy

## 2016-07-21 DIAGNOSIS — M542 Cervicalgia: Secondary | ICD-10-CM | POA: Diagnosis not present

## 2016-07-21 DIAGNOSIS — M6281 Muscle weakness (generalized): Secondary | ICD-10-CM | POA: Diagnosis not present

## 2016-07-22 NOTE — Therapy (Signed)
[facility] [address] [place], [address] Phone: [phone]   Fax:  [phone]  Physical Therapy Treatment  Patient Details  Name: Rachel Fry MRN: [id number] Date of Birth: Jan 21, 1956 Referring Provider: Orie Rout  Encounter Date: 07/21/2016      PT End of Session - 07/21/16 0651    Visit Number 4   Number of Visits 7   Date for PT Re-Evaluation 08/04/16   PT Start Time 1350   PT Stop Time 1430   PT Time Calculation (min) 40 min   Activity Tolerance Patient tolerated treatment well      Past Medical History:  Diagnosis Date  . Allergy   . Migraines   . Numbness and tingling    left side of body, occasionally right side   . S/P Botox injection     Past Surgical History:  Procedure Laterality Date  . ARM NEUROPLASTY Right 1994 and 1996   for chronic pain  . CESAREAN SECTION    . OVARIAN CYST REMOVAL  1973  . TONSILLECTOMY AND ADENOIDECTOMY    . TUBAL LIGATION  1988    There were no vitals filed for this visit.      Subjective Assessment - 07/21/16 0650    Subjective Pt has noted decr. zinging since beginning her recognise app training.   Diagnostic tests negative MRI for cervical, cranial regions.    Currently in Pain? Yes   Pain Score 2    Pain Location Head   Pain Orientation Left          Objective: Reviewed recognise app use, educated pt on meaning of numbers. Encouraged pt to continue with this.  Slow supine disassociation exercises including supine cervical rotation with, cervical rotation with fixed gaze, lateral eye gaze. All performed on L, then pt performed single R rotation, noted decr. Pain and improved movement with this.  Performed similar disassociation with focus on UE control. Issued this as HEP.                       PT Education - 07/21/16 0650    Education provided Yes   Education Details feldenkrais like exercise for  disassociation   Person(s) Educated Patient   Methods Explanation   Comprehension Verbalized understanding             PT Long Term Goals - 06/23/16 1625      PT LONG TERM GOAL #1   Title Pt will improve L sided pelvic control as seen by ability to perform lunge = B   Baseline unable to perform on L   Time 6   Period Weeks   Status New     PT LONG TERM GOAL #2   Title Pt will be able to perform SLR on L demonstrating improved motor pattern globally to decr. excess hypercontrol from cervical spine   Time 6   Period Weeks   Status New     PT LONG TERM GOAL #3   Title Pt will verbalize TNE principles for addressing chronic pain   Period Weeks   Status New               Plan - 07/21/16 [id number]    Clinical Impression Statement Pt appears to be making improvement with use of recognise app. Will continue to use this in conjunction with disassociation technique to treat pain. Pt continues to have her pain and HA but has  noticed definite improvement in zinging. ability to distinguish R side is still significantly limited using app, approximately 40% slower and less accurate than opposite side.   Rehab Potential Fair   Clinical Impairments Affecting Rehab Potential chronic pain, motivation, multiple treatment modalities   PT Frequency 1x / week   PT Duration 6 weeks   PT Treatment/Interventions ADLs/Self Care Home Management;Dry needling;Manual techniques;Therapeutic exercise;Therapeutic activities;Neuromuscular re-education;Aquatic Therapy   Consulted and Agree with Plan of Care Patient      Patient will benefit from skilled therapeutic intervention in order to improve the following deficits and impairments:  Pain, Decreased strength, Decreased endurance, Decreased activity tolerance, Decreased balance  Visit Diagnosis: Muscle weakness (generalized)  Cervicalgia     Problem List Patient Active Problem List   Diagnosis Date Noted  . Lower back pain 04/02/2016  .  Acute stress disorder 02/25/2015  . Anxiety 02/25/2015  . Airway hyperreactivity 02/25/2015  . Bradycardia 02/25/2015  . Hypersomnia 02/25/2015  . Clinical depression 02/25/2015  . Elevated blood sugar 02/25/2015  . 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 Acid reflux 02/25/2015  . 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 H/O disease 02/25/2015  . Hypercholesteremia 02/25/2015  . Headache, migraine 02/25/2015  . Muscle ache 02/25/2015  . Allergic rhinitis, seasonal 02/25/2015  . Avitaminosis D 02/25/2015    Jaritza Duignan 07/22/2016, 6:54 AM  [facility] [address] [place], [address] Phone: [phone]   Fax:  [phone]  Name: RAIYNE KARWACKI MRN: [id number] Date of Birth: Feb 27, 1956

## 2016-08-05 DIAGNOSIS — M791 Myalgia: Secondary | ICD-10-CM | POA: Diagnosis not present

## 2016-08-05 DIAGNOSIS — R51 Headache: Secondary | ICD-10-CM | POA: Diagnosis not present

## 2016-08-05 DIAGNOSIS — M542 Cervicalgia: Secondary | ICD-10-CM | POA: Diagnosis not present

## 2016-08-05 DIAGNOSIS — G43019 Migraine without aura, intractable, without status migrainosus: Secondary | ICD-10-CM | POA: Diagnosis not present

## 2016-08-05 DIAGNOSIS — G43719 Chronic migraine without aura, intractable, without status migrainosus: Secondary | ICD-10-CM | POA: Diagnosis not present

## 2016-08-11 ENCOUNTER — Ambulatory Visit: Payer: 59 | Admitting: Physical Therapy

## 2016-09-23 DIAGNOSIS — G43719 Chronic migraine without aura, intractable, without status migrainosus: Secondary | ICD-10-CM | POA: Diagnosis not present

## 2016-09-23 DIAGNOSIS — G43019 Migraine without aura, intractable, without status migrainosus: Secondary | ICD-10-CM | POA: Diagnosis not present

## 2016-09-24 ENCOUNTER — Other Ambulatory Visit: Payer: Self-pay | Admitting: Family Medicine

## 2016-10-09 DIAGNOSIS — J301 Allergic rhinitis due to pollen: Secondary | ICD-10-CM | POA: Diagnosis not present

## 2016-10-09 DIAGNOSIS — J4520 Mild intermittent asthma, uncomplicated: Secondary | ICD-10-CM | POA: Diagnosis not present

## 2016-10-12 ENCOUNTER — Other Ambulatory Visit: Payer: Self-pay | Admitting: Neurology

## 2016-10-12 NOTE — Telephone Encounter (Signed)
I will refill for 2 months but she needs to follow up with primary care for her anxiety and get this medication from them going forward if she needs it, I gave it to her temporarily but I can;t prescribe this long term thanks.

## 2016-10-13 ENCOUNTER — Other Ambulatory Visit: Payer: Self-pay | Admitting: Neurology

## 2016-10-13 ENCOUNTER — Encounter: Payer: Self-pay | Admitting: *Deleted

## 2016-10-13 MED ORDER — ALPRAZOLAM 0.25 MG PO TABS: ORAL_TABLET | ORAL | 1 refills | Status: DC

## 2016-10-13 MED ORDER — ALPRAZOLAM 0.25 MG PO TABS: ORAL_TABLET | ORAL | 2 refills | Status: DC

## 2016-10-13 NOTE — Telephone Encounter (Signed)
Called and spoke to pt. Relayed AA,MD message. She stated that she does not take rx for anxiety. She take for numb/tingling. Advised per AA,MD that she should f/u with [facility] since they manage her headaches there. She verbalized understanding  Advised we will refill for 2 months and send to pharmacy.

## 2016-10-13 NOTE — Progress Notes (Signed)
Faxed printed rx xanax to pt pharmacy. Fax: [phone]. Received confirmation.

## 2016-10-21 DIAGNOSIS — G43719 Chronic migraine without aura, intractable, without status migrainosus: Secondary | ICD-10-CM | POA: Diagnosis not present

## 2016-10-21 DIAGNOSIS — R51 Headache: Secondary | ICD-10-CM | POA: Diagnosis not present

## 2016-10-21 DIAGNOSIS — M542 Cervicalgia: Secondary | ICD-10-CM | POA: Diagnosis not present

## 2016-10-21 DIAGNOSIS — M791 Myalgia: Secondary | ICD-10-CM | POA: Diagnosis not present

## 2016-10-21 DIAGNOSIS — G43019 Migraine without aura, intractable, without status migrainosus: Secondary | ICD-10-CM | POA: Diagnosis not present

## 2016-11-04 ENCOUNTER — Encounter: Payer: Self-pay | Admitting: Physician Assistant

## 2016-11-04 ENCOUNTER — Ambulatory Visit: Payer: Self-pay | Admitting: Physician Assistant

## 2016-11-04 VITALS — BP 130/80 | HR 78 | Temp 98.4°F

## 2016-11-04 DIAGNOSIS — J0100 Acute maxillary sinusitis, unspecified: Secondary | ICD-10-CM

## 2016-11-04 MED ORDER — AZITHROMYCIN 250 MG PO TABS
ORAL_TABLET | ORAL | 0 refills | Status: DC
Start: 2016-11-04 — End: 2016-11-17

## 2016-11-04 MED ORDER — METHYLPREDNISOLONE 4 MG PO TBPK: ORAL_TABLET | ORAL | 0 refills | Status: DC

## 2016-11-04 NOTE — Progress Notes (Signed)
S: C/o runny nose and congestion for 3 days, no fever, chills, cp/sob, v/d; mucus was green this am but clear throughout the day, cough is sporadic, voice is hoarse, was exposed to a lot of rugs/flooring which started the sx, husband has been coughing with temp  Using otc meds: robitussin  O: PE: vitals wnl, nad, perrl eomi, normocephalic, tms dull, nasal mucosa red and swollen, throat injected, neck supple no lymph, lungs c t a, cv rrr, neuro intact  A:  Acute  uri   P: drink fluids, continue regular meds , use otc meds of choice, return if not improving in 5 days, return earlier if worsening , zpack, medrol dose, if husband tests + for flu call the clinic and we will call in tamiflu

## 2016-11-17 ENCOUNTER — Ambulatory Visit: Payer: Self-pay | Admitting: Physician Assistant

## 2016-11-17 VITALS — BP 120/78 | HR 80 | Temp 97.8°F

## 2016-11-17 DIAGNOSIS — H6991 Unspecified Eustachian tube disorder, right ear: Secondary | ICD-10-CM

## 2016-11-17 MED ORDER — PREDNISONE 10 MG PO TABS: ORAL_TABLET | ORAL | 0 refills | Status: DC

## 2016-11-18 DIAGNOSIS — M791 Myalgia: Secondary | ICD-10-CM | POA: Diagnosis not present

## 2016-11-18 DIAGNOSIS — M542 Cervicalgia: Secondary | ICD-10-CM | POA: Diagnosis not present

## 2016-11-18 DIAGNOSIS — G43019 Migraine without aura, intractable, without status migrainosus: Secondary | ICD-10-CM | POA: Diagnosis not present

## 2016-11-18 DIAGNOSIS — G43719 Chronic migraine without aura, intractable, without status migrainosus: Secondary | ICD-10-CM | POA: Diagnosis not present

## 2016-11-18 DIAGNOSIS — R51 Headache: Secondary | ICD-10-CM | POA: Diagnosis not present

## 2016-11-18 NOTE — Progress Notes (Signed)
Seen by R. Madalyn Rob, PA-c

## 2016-11-30 DIAGNOSIS — H6980 Other specified disorders of Eustachian tube, unspecified ear: Secondary | ICD-10-CM | POA: Diagnosis not present

## 2016-11-30 DIAGNOSIS — J019 Acute sinusitis, unspecified: Secondary | ICD-10-CM | POA: Diagnosis not present

## 2016-12-10 ENCOUNTER — Ambulatory Visit (INDEPENDENT_AMBULATORY_CARE_PROVIDER_SITE_OTHER): Payer: 59 | Admitting: Physician Assistant

## 2016-12-10 ENCOUNTER — Encounter: Payer: Self-pay | Admitting: Physician Assistant

## 2016-12-10 VITALS — BP 120/80 | HR 82 | Temp 98.2°F | Resp 16 | Ht 67.0 in | Wt 154.6 lb

## 2016-12-10 DIAGNOSIS — Z1159 Encounter for screening for other viral diseases: Secondary | ICD-10-CM | POA: Diagnosis not present

## 2016-12-10 DIAGNOSIS — Z0000 Encounter for general adult medical examination without abnormal findings: Secondary | ICD-10-CM

## 2016-12-10 DIAGNOSIS — Z124 Encounter for screening for malignant neoplasm of cervix: Secondary | ICD-10-CM | POA: Diagnosis not present

## 2016-12-10 DIAGNOSIS — E7800 Pure hypercholesterolemia, unspecified: Secondary | ICD-10-CM

## 2016-12-10 DIAGNOSIS — E559 Vitamin D deficiency, unspecified: Secondary | ICD-10-CM | POA: Diagnosis not present

## 2016-12-10 DIAGNOSIS — Z1231 Encounter for screening mammogram for malignant neoplasm of breast: Secondary | ICD-10-CM | POA: Diagnosis not present

## 2016-12-10 DIAGNOSIS — R739 Hyperglycemia, unspecified: Secondary | ICD-10-CM

## 2016-12-10 DIAGNOSIS — Z1211 Encounter for screening for malignant neoplasm of colon: Secondary | ICD-10-CM | POA: Diagnosis not present

## 2016-12-10 DIAGNOSIS — Z114 Encounter for screening for human immunodeficiency virus [HIV]: Secondary | ICD-10-CM | POA: Diagnosis not present

## 2016-12-10 DIAGNOSIS — Z1239 Encounter for other screening for malignant neoplasm of breast: Secondary | ICD-10-CM

## 2016-12-10 LAB — IFOBT (OCCULT BLOOD): IFOBT: NEGATIVE

## 2016-12-10 NOTE — Progress Notes (Addendum)
Patient: Rachel Fry, Female    DOB: [birth year], 61 y.o.   MRN: [id number] Visit Date: 12/10/2016  Today's Provider: Mar Daring, PA-C   Chief Complaint  Patient presents with  . Annual Exam   Subjective:    Annual physical exam Rachel Fry is a 61 y.o. female who presents today for health maintenance and complete physical. She feels well. She reports exercising none. She reports she is sleeping fairly well.  Last CPE: 03/28/2015 Mammogram:10/10/15 BI-RADS 1 Colon:06/10/12 Polyps, recheck in 10 years -----------------------------------------------------------------   Review of Systems  Constitutional: Positive for unexpected weight change (6 lbs in the last month (Prednisone)).  HENT: Positive for ear pain (pressure/fluid).   Eyes: Negative.   Respiratory: Negative.   Cardiovascular: Negative.   Gastrointestinal: Negative.   Endocrine: Negative.   Genitourinary: Negative.   Musculoskeletal: Negative.   Skin: Negative.   Allergic/Immunologic: Positive for environmental allergies and food allergies.  Neurological: Positive for numbness and headaches (Migraines;followed by Dr. Domingo Cocking).  Hematological: Negative.   Psychiatric/Behavioral: Negative.     Social History      She  reports that she has never smoked. She has never used smokeless tobacco. She reports that she does not drink alcohol or use drugs.       Social History   Social History  . Marital status: Married    Spouse name: Rachel Fry  . Number of children: 3  . Years of education: 70   Occupational History  . [employer] re[employer]    Social History Main Topics  . Smoking status: Never Smoker  . Smokeless tobacco: Never Used  . Alcohol use No  . Drug use: No  . Sexual activity: Not Asked   Other Topics Concern  . None   Social History Narrative   Lives with husband   Caffeine use: none    Past Medical History:  Diagnosis Date  . Allergy   . Migraines   . Numbness and tingling    left side of body, occasionally right side   . S/P Botox injection      Patient Active Problem List   Diagnosis Date Noted  . Lower back pain 04/02/2016  . Acute stress disorder 02/25/2015  . Anxiety 02/25/2015  . Airway hyperreactivity 02/25/2015  . Bradycardia 02/25/2015  . Hypersomnia 02/25/2015  . Clinical depression 02/25/2015  . Elevated blood sugar 02/25/2015  . 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 Acid reflux 02/25/2015  . 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 H/O disease 02/25/2015  . Hypercholesteremia 02/25/2015  . Headache, migraine 02/25/2015  . Muscle ache 02/25/2015  . Allergic rhinitis, seasonal 02/25/2015  . Avitaminosis D 02/25/2015    Past Surgical History:  Procedure Laterality Date  . ARM NEUROPLASTY Right 1994 and 1996   for chronic pain  . CESAREAN SECTION    . OVARIAN CYST REMOVAL  1973  . TONSILLECTOMY AND ADENOIDECTOMY    . TUBAL LIGATION  1988    Family History        Family Status  Relation Status  . Mother Deceased at age 31   stroke  . Father Deceased at age 57   MI  . Brother Alive  . Brother Alive  . Brother Alive  . Brother Alive   heart transplant        Her family history includes Birth defects in her brother; Breast cancer (age of onset: 29) in her mother; Cancer in her brother; Cardiomyopathy in her father; Colon cancer  in her maternal grandmother; Congenital heart disease in her brother; Dementia in her mother; Hyperlipidemia in her brother and brother; Hypertension in her brother, brother, brother, and mother; Peripheral Artery Disease in her father; Stroke in her mother and paternal grandfather; Thyroid disease in her brother and mother.     Allergies  Allergen Reactions  . Codeine     Rash/vomiting  . Fish Oil Itching  . Sulfa Antibiotics     abdominal pain     Current Outpatient Prescriptions:  .  atorvastatin (LIPITOR) 20 MG tablet, TAKE 1 TABLET (20 MG TOTAL) BY MOUTH DAILY., Disp: 90 tablet, Rfl: 3 .  botulinum toxin Type  A (BOTOX) 100 UNITS SOLR injection, Botox - Historical Medication  once every 3 months for migraines Active, Disp: , Rfl:  .  cetirizine (ZYRTEC) 10 MG tablet, Take by mouth., Disp: , Rfl:  .  EPINEPHrine 0.3 mg/0.3 mL IJ SOAJ injection, , Disp: , Rfl: 1 .  fluticasone (FLOVENT HFA) 110 MCG/ACT inhaler, Inhale 2 puffs into the lungs daily. , Disp: , Rfl:  .  frovatriptan (FROVA) 2.5 MG tablet, Take 2.5 mg by mouth as needed. , Disp: , Rfl:  .  lansoprazole (PREVACID) 15 MG capsule, Take 15 mg by mouth daily. , Disp: , Rfl:  .  TRIAMCINOLONE ACETONIDE, NASAL STEROIDS,, Place 2 sprays into the nose at bedtime. , Disp: , Rfl:  .  Vitamin D, Ergocalciferol, (DRISDOL) 50000 units CAPS capsule, Take one capsule every other week., Disp: 6 capsule, Rfl: 2 .  zafirlukast (ACCOLATE) 10 MG tablet, Take 10 mg by mouth 2 (two) times daily. , Disp: , Rfl:  .  ALPRAZolam (XANAX) 0.25 MG tablet, Take 1 tablet (0.25 mg total) by mouth up to 2 (two) times daily only as needed. (Patient not taking: Reported on 11/17/2016), Disp: 30 tablet, Rfl: 1 .  meclizine (ANTIVERT) 25 MG tablet, Take 1 tablet (25 mg total) by mouth 3 (three) times daily as needed for dizziness. (Patient not taking: Reported on 12/10/2016), Disp: 21 tablet, Rfl: 0 .  predniSONE (DELTASONE) 10 MG tablet, Take 6 tablets  today, on day 2 take 5 tablets, day 3 take 4 tablets, day 4 take 3 tablets, day 5 take  2 tablets and 1 tablet the last day (Patient not taking: Reported on 12/10/2016), Disp: 21 tablet, Rfl: 0   Patient Care Team: Mar Daring, PA-C as PCP - General (Family Medicine)      Objective:   Vitals: BP 120/80 (BP Location: Right Arm, Patient Position: Sitting, Cuff Size: Normal)   Pulse 82   Temp 98.2 F (36.8 C) (Oral)   Resp 16   Ht 5\' 7"  (1.702 m)   Wt 154 lb 9.6 oz (70.1 kg)   BMI 24.21 kg/m    Physical Exam  Constitutional: She is oriented to person, place, and time. She appears well-developed and well-nourished. No  distress.  HENT:  Head: Normocephalic and atraumatic.  Right Ear: Hearing, external ear and ear canal normal. Tympanic membrane is erythematous and bulging. A middle ear effusion is present.  Left Ear: Hearing, tympanic membrane, external ear and ear canal normal.  Nose: Nose normal.  Mouth/Throat: Uvula is midline, oropharynx is clear and moist and mucous membranes are normal. No oropharyngeal exudate.  Eyes: Conjunctivae and EOM are normal. Pupils are equal, round, and reactive to light. Right eye exhibits no discharge. Left eye exhibits no discharge. No scleral icterus.  Neck: Normal range of motion. Neck supple. No JVD present.  Carotid bruit is not present. No tracheal deviation present. No thyromegaly present.  Cardiovascular: Normal rate, regular rhythm, normal heart sounds and intact distal pulses.  Exam reveals no gallop and no friction rub.   No murmur heard. Pulmonary/Chest: Effort normal and breath sounds normal. No respiratory distress. She has no wheezes. She has no rales. She exhibits no tenderness. Right breast exhibits no inverted nipple, no mass, no nipple discharge, no skin change and no tenderness. Left breast exhibits no inverted nipple, no mass, no nipple discharge, no skin change and no tenderness. Breasts are symmetrical.  Abdominal: Soft. Bowel sounds are normal. She exhibits no distension and no mass. There is no tenderness. There is no rebound and no guarding. Hernia confirmed negative in the right inguinal area and confirmed negative in the left inguinal area.  Genitourinary: Rectum normal, vagina normal and uterus normal. No breast swelling, tenderness, discharge or bleeding. Pelvic exam was performed with patient supine. There is no rash, tenderness, lesion or injury on the right labia. There is no rash, tenderness, lesion or injury on the left labia. Cervix exhibits no motion tenderness, no discharge and no friability. Right adnexum displays no mass, no tenderness and no  fullness. Left adnexum displays no mass, no tenderness and no fullness. No erythema, tenderness or bleeding in the vagina. No signs of injury around the vagina. No vaginal discharge found.  Musculoskeletal: Normal range of motion. She exhibits no edema or tenderness.  Lymphadenopathy:    She has no cervical adenopathy.       Right: No inguinal adenopathy present.       Left: No inguinal adenopathy present.  Neurological: She is alert and oriented to person, place, and time. She has normal reflexes. No cranial nerve deficit. Coordination normal.  Skin: Skin is warm and dry. No rash noted. She is not diaphoretic.  Psychiatric: She has a normal mood and affect. Her behavior is normal. Judgment and thought content normal.  Vitals reviewed.    Depression Screen PHQ 2/9 Scores 03/28/2015  PHQ - 2 Score 0      Assessment & Plan:     Routine Health Maintenance and Physical Exam  Exercise Activities and Dietary recommendations Goals    None      Immunization History  Administered Date(s) Administered  . Influenza,inj,Quad PF,36+ Mos 08/27/2015  . Tdap 10/11/2009    Health Maintenance  Topic Date Due  . Hepatitis C Screening  April 24, 1956  . HIV Screening  06/21/1970  . PAP SMEAR  02/04/2015  . ZOSTAVAX  06/22/2015  . INFLUENZA VACCINE  05/26/2016  . MAMMOGRAM  10/09/2017  . TETANUS/TDAP  10/12/2019  . COLONOSCOPY  06/10/2022     Discussed health benefits of physical activity, and encouraged her to engage in regular exercise appropriate for her age and condition.    1. Annual physical exam Normal physical exam today. Will check labs as below and f/u pending lab results. If labs are stable and WNL she will not need to have these rechecked for one year at her next annual physical exam. She is to call the office in the meantime if she has any acute issue, questions or concerns. - CBC with Differential/Platelet - Comprehensive metabolic panel - TSH  2. Breast cancer  screening Breast exam today was normal. There is family history of breast cancer in her mother (age of onset was 31s). She does perform regular self breast exams. Mammogram was ordered as below. Information for [facility] was given to patient so  she may schedule her mammogram at her convenience. - MM DIGITAL SCREENING BILATERAL; Future  3. Cervical cancer screening Pap collected today. Will send as below and f/u pending results. - Pap IG and HPV (high risk) DNA detection  4. Colon cancer screening OC lite collected today and was negative. - IFOBT POC (occult bld, rslt in office)  5. Hypercholesteremia Stable. Continue atorvastatin 20mg . Will check labs as below and f/u pending results. - Lipid panel  6. Elevated blood sugar Will check labs as below and f/u pending results. - Comprehensive metabolic panel - Hemoglobin A1c  7. Vitamin D deficiency Will check labs as below and f/u pending results. - Vitamin D (25 hydroxy)  8. Need for hepatitis C screening test - Hepatitis C Antibody  9. Screening for HIV without presence of risk factors - HIV antibody (with reflex)  --------------------------------------------------------------------    Mar Daring, PA-C  [facility]

## 2016-12-10 NOTE — Patient Instructions (Signed)

## 2016-12-11 ENCOUNTER — Telehealth: Payer: Self-pay

## 2016-12-11 LAB — COMPREHENSIVE METABOLIC PANEL
ALT: 20 IU/L (ref 0–32)
AST: 19 IU/L (ref 0–40)
Albumin/Globulin Ratio: 2 (ref 1.2–2.2)
Albumin: 4.3 g/dL (ref 3.6–4.8)
Alkaline Phosphatase: 78 IU/L (ref 39–117)
BILIRUBIN TOTAL: 0.5 mg/dL (ref 0.0–1.2)
BUN/Creatinine Ratio: 17 (ref 12–28)
BUN: 12 mg/dL (ref 8–27)
CALCIUM: 9.8 mg/dL (ref 8.7–10.3)
CHLORIDE: 101 mmol/L (ref 96–106)
CO2: 24 mmol/L (ref 18–29)
Creatinine, Ser: 0.7 mg/dL (ref 0.57–1.00)
GFR, EST AFRICAN AMERICAN: 108 mL/min/{1.73_m2} (ref >59)
GFR, EST NON AFRICAN AMERICAN: 94 mL/min/{1.73_m2} (ref >59)
GLUCOSE: 85 mg/dL (ref 65–99)
Globulin, Total: 2.1 g/dL (ref 1.5–4.5)
Potassium: 4 mmol/L (ref 3.5–5.2)
Sodium: 142 mmol/L (ref 134–144)
TOTAL PROTEIN: 6.4 g/dL (ref 6.0–8.5)

## 2016-12-11 LAB — CBC WITH DIFFERENTIAL/PLATELET
BASOS ABS: 0 10*3/uL (ref 0.0–0.2)
Basos: 0 %
EOS (ABSOLUTE): 0 10*3/uL (ref 0.0–0.4)
Eos: 1 %
Hematocrit: 41.2 % (ref 34.0–46.6)
Hemoglobin: 13.4 g/dL (ref 11.1–15.9)
IMMATURE GRANS (ABS): 0 10*3/uL (ref 0.0–0.1)
IMMATURE GRANULOCYTES: 0 %
LYMPHS: 30 %
Lymphocytes Absolute: 1.8 10*3/uL (ref 0.7–3.1)
MCH: 30.2 pg (ref 26.6–33.0)
MCHC: 32.5 g/dL (ref 31.5–35.7)
MCV: 93 fL (ref 79–97)
Monocytes Absolute: 0.5 10*3/uL (ref 0.1–0.9)
Monocytes: 9 %
NEUTROS PCT: 60 %
Neutrophils Absolute: 3.5 10*3/uL (ref 1.4–7.0)
PLATELETS: 222 10*3/uL (ref 150–379)
RBC: 4.44 x10E6/uL (ref 3.77–5.28)
RDW: 13.9 % (ref 12.3–15.4)
WBC: 5.9 10*3/uL (ref 3.4–10.8)

## 2016-12-11 LAB — LIPID PANEL
Chol/HDL Ratio: 3 ratio units (ref 0.0–4.4)
Cholesterol, Total: 201 mg/dL — ABNORMAL HIGH (ref 100–199)
HDL: 66 mg/dL (ref >39)
LDL CALC: 123 mg/dL — AB (ref 0–99)
TRIGLYCERIDES: 61 mg/dL (ref 0–149)
VLDL Cholesterol Cal: 12 mg/dL (ref 5–40)

## 2016-12-11 LAB — TSH: TSH: 3.64 u[IU]/mL (ref 0.450–4.500)

## 2016-12-11 LAB — VITAMIN D 25 HYDROXY (VIT D DEFICIENCY, FRACTURES): Vit D, 25-Hydroxy: 29.6 ng/mL — ABNORMAL LOW (ref 30.0–100.0)

## 2016-12-11 LAB — HEPATITIS C ANTIBODY

## 2016-12-11 LAB — HIV ANTIBODY (ROUTINE TESTING W REFLEX): HIV Screen 4th Generation wRfx: NONREACTIVE

## 2016-12-11 LAB — HEMOGLOBIN A1C
ESTIMATED AVERAGE GLUCOSE: 108 mg/dL
Hgb A1c MFr Bld: 5.4 % (ref 4.8–5.6)

## 2016-12-11 NOTE — Telephone Encounter (Signed)
LMTCB-KW 

## 2016-12-11 NOTE — Telephone Encounter (Signed)
-----   Message from Mar Daring, [place] sent at 12/11/2016 11:09 AM EST ----- All labs are stable and WNL. Vit D however remains lower at 29.6

## 2016-12-14 LAB — PAP IG AND HPV HIGH-RISK
HPV, high-risk: NEGATIVE
PAP SMEAR COMMENT: 0

## 2016-12-14 NOTE — Telephone Encounter (Signed)
-----   Message from Mar Daring, PA-C sent at 12/14/2016  9:49 AM EST ----- Pap is normal, HPV negative.  Will repeat in 3-5 years.

## 2016-12-14 NOTE — Telephone Encounter (Signed)
LMTCB

## 2016-12-17 NOTE — Telephone Encounter (Signed)
Advised  ED 

## 2016-12-23 DIAGNOSIS — G43019 Migraine without aura, intractable, without status migrainosus: Secondary | ICD-10-CM | POA: Diagnosis not present

## 2016-12-23 DIAGNOSIS — G43719 Chronic migraine without aura, intractable, without status migrainosus: Secondary | ICD-10-CM | POA: Diagnosis not present

## 2017-01-12 DIAGNOSIS — R51 Headache: Secondary | ICD-10-CM | POA: Diagnosis not present

## 2017-01-12 DIAGNOSIS — M542 Cervicalgia: Secondary | ICD-10-CM | POA: Diagnosis not present

## 2017-01-12 DIAGNOSIS — G43019 Migraine without aura, intractable, without status migrainosus: Secondary | ICD-10-CM | POA: Diagnosis not present

## 2017-01-12 DIAGNOSIS — G43719 Chronic migraine without aura, intractable, without status migrainosus: Secondary | ICD-10-CM | POA: Diagnosis not present

## 2017-01-12 DIAGNOSIS — M791 Myalgia: Secondary | ICD-10-CM | POA: Diagnosis not present

## 2017-02-10 DIAGNOSIS — G43019 Migraine without aura, intractable, without status migrainosus: Secondary | ICD-10-CM | POA: Diagnosis not present

## 2017-02-10 DIAGNOSIS — G43719 Chronic migraine without aura, intractable, without status migrainosus: Secondary | ICD-10-CM | POA: Diagnosis not present

## 2017-02-10 DIAGNOSIS — M791 Myalgia: Secondary | ICD-10-CM | POA: Diagnosis not present

## 2017-02-10 DIAGNOSIS — M542 Cervicalgia: Secondary | ICD-10-CM | POA: Diagnosis not present

## 2017-02-10 DIAGNOSIS — R51 Headache: Secondary | ICD-10-CM | POA: Diagnosis not present

## 2017-03-09 ENCOUNTER — Ambulatory Visit
Admission: RE | Admit: 2017-03-09 | Discharge: 2017-03-09 | Disposition: A | Discharge status: Home / Self Care | Payer: 59 | Source: Ambulatory Visit | Attending: Physician Assistant | Admitting: Physician Assistant

## 2017-03-09 DIAGNOSIS — Z1239 Encounter for other screening for malignant neoplasm of breast: Secondary | ICD-10-CM

## 2017-03-09 DIAGNOSIS — Z1231 Encounter for screening mammogram for malignant neoplasm of breast: Secondary | ICD-10-CM | POA: Diagnosis not present

## 2017-03-10 ENCOUNTER — Telehealth: Payer: Self-pay

## 2017-03-10 NOTE — Telephone Encounter (Signed)
Patient advised as directed below.  Thanks,  -Joseline 

## 2017-03-10 NOTE — Telephone Encounter (Signed)
-----   Message from Mar Daring, PA-C sent at 03/10/2017 10:30 AM EDT ----- Normal mammogram. Repeat screening in one year.

## 2017-03-10 NOTE — Telephone Encounter (Signed)
LMTCB  Thanks,  -Joseline 

## 2017-03-24 DIAGNOSIS — M542 Cervicalgia: Secondary | ICD-10-CM | POA: Diagnosis not present

## 2017-03-24 DIAGNOSIS — M791 Myalgia: Secondary | ICD-10-CM | POA: Diagnosis not present

## 2017-03-24 DIAGNOSIS — G518 Other disorders of facial nerve: Secondary | ICD-10-CM | POA: Diagnosis not present

## 2017-03-24 DIAGNOSIS — G43719 Chronic migraine without aura, intractable, without status migrainosus: Secondary | ICD-10-CM | POA: Diagnosis not present

## 2017-03-31 ENCOUNTER — Ambulatory Visit: Payer: Self-pay | Admitting: Physician Assistant

## 2017-03-31 ENCOUNTER — Encounter: Payer: Self-pay | Admitting: Physician Assistant

## 2017-03-31 VITALS — BP 160/110 | HR 78 | Temp 98.2°F

## 2017-03-31 DIAGNOSIS — W57XXXA Bitten or stung by nonvenomous insect and other nonvenomous arthropods, initial encounter: Secondary | ICD-10-CM

## 2017-03-31 NOTE — Progress Notes (Signed)
S: c/o bug bite to r foot/ankle, was outside when she was in [place] and saw some mosquitoes, no known tick bite, no fever/chills/, no rash other than the red area at the bite mark, does have a headache today but has headaches all of the time, bite happened over a week ago  O: vitals w elevated bp, nad, skin with bruised red raised bite mark, no rash surrounding the mark, no drainage from the site, no increased warmth at the site, n/v intact  A: bug bite  P: f/u if worsening, recheck bp at home , return if still elevated

## 2017-04-01 ENCOUNTER — Encounter: Payer: Self-pay | Admitting: Neurology

## 2017-04-02 ENCOUNTER — Telehealth: Payer: Self-pay | Admitting: Neurology

## 2017-04-02 ENCOUNTER — Emergency Department: Payer: 59

## 2017-04-02 ENCOUNTER — Emergency Department
Admission: EM | Admit: 2017-04-02 | Discharge: 2017-04-02 | Disposition: A | Discharge status: Home / Self Care | Payer: 59 | Attending: Emergency Medicine | Admitting: Emergency Medicine

## 2017-04-02 ENCOUNTER — Encounter: Payer: Self-pay | Admitting: Emergency Medicine

## 2017-04-02 DIAGNOSIS — R200 Anesthesia of skin: Secondary | ICD-10-CM | POA: Diagnosis not present

## 2017-04-02 DIAGNOSIS — Z79899 Other long term (current) drug therapy: Secondary | ICD-10-CM | POA: Diagnosis not present

## 2017-04-02 DIAGNOSIS — G43901 Migraine, unspecified, not intractable, with status migrainosus: Secondary | ICD-10-CM | POA: Diagnosis not present

## 2017-04-02 DIAGNOSIS — R531 Weakness: Secondary | ICD-10-CM | POA: Diagnosis not present

## 2017-04-02 DIAGNOSIS — R51 Headache: Secondary | ICD-10-CM | POA: Diagnosis not present

## 2017-04-02 DIAGNOSIS — R519 Headache, unspecified: Secondary | ICD-10-CM

## 2017-04-02 DIAGNOSIS — R202 Paresthesia of skin: Secondary | ICD-10-CM | POA: Diagnosis not present

## 2017-04-02 DIAGNOSIS — M6281 Muscle weakness (generalized): Secondary | ICD-10-CM | POA: Diagnosis not present

## 2017-04-02 LAB — BASIC METABOLIC PANEL
Anion gap: 6 (ref 5–15)
BUN: 15 mg/dL (ref 6–20)
CALCIUM: 10 mg/dL (ref 8.9–10.3)
CO2: 30 mmol/L (ref 22–32)
CREATININE: 0.83 mg/dL (ref 0.44–1.00)
Chloride: 103 mmol/L (ref 101–111)
GLUCOSE: 85 mg/dL (ref 65–99)
Potassium: 3.6 mmol/L (ref 3.5–5.1)
Sodium: 139 mmol/L (ref 135–145)

## 2017-04-02 LAB — URINALYSIS, COMPLETE (UACMP) WITH MICROSCOPIC
Bacteria, UA: NONE SEEN
Bilirubin Urine: NEGATIVE
GLUCOSE, UA: NEGATIVE mg/dL
HGB URINE DIPSTICK: NEGATIVE
Ketones, ur: NEGATIVE mg/dL
Leukocytes, UA: NEGATIVE
NITRITE: NEGATIVE
PH: 6 (ref 5.0–8.0)
PROTEIN: NEGATIVE mg/dL
RBC / HPF: NONE SEEN RBC/hpf (ref 0–5)
SPECIFIC GRAVITY, URINE: 1.018 (ref 1.005–1.030)
Squamous Epithelial / LPF: NONE SEEN

## 2017-04-02 LAB — CBC
HCT: 42.4 % (ref 35.0–47.0)
HEMOGLOBIN: 14.2 g/dL (ref 12.0–16.0)
MCH: 30.3 pg (ref 26.0–34.0)
MCHC: 33.4 g/dL (ref 32.0–36.0)
MCV: 90.6 fL (ref 80.0–100.0)
PLATELETS: 204 10*3/uL (ref 150–440)
RBC: 4.69 MIL/uL (ref 3.80–5.20)
RDW: 13.6 % (ref 11.5–14.5)
WBC: 5.6 10*3/uL (ref 3.6–11.0)

## 2017-04-02 LAB — SEDIMENTATION RATE: Sed Rate: 7 mm/hr (ref 0–30)

## 2017-04-02 MED ORDER — SODIUM CHLORIDE 0.9 % IV BOLUS (SEPSIS)
1000.0000 mL | Freq: Once | INTRAVENOUS | Status: AC
  Administered 2017-04-02: 1000 mL via INTRAVENOUS

## 2017-04-02 MED ORDER — KETOROLAC TROMETHAMINE 60 MG/2ML IM SOLN
INTRAMUSCULAR | Status: AC
  Filled 2017-04-02: qty 2

## 2017-04-02 MED ORDER — KETOROLAC TROMETHAMINE 30 MG/ML IJ SOLN
30.0000 mg | Freq: Once | INTRAMUSCULAR | Status: AC
  Administered 2017-04-02: 30 mg via INTRAVENOUS
  Filled 2017-04-02: qty 1

## 2017-04-02 MED ORDER — PROCHLORPERAZINE EDISYLATE 5 MG/ML IJ SOLN
10.0000 mg | Freq: Once | INTRAMUSCULAR | Status: AC
  Administered 2017-04-02: 10 mg via INTRAVENOUS
  Filled 2017-04-02: qty 2

## 2017-04-02 MED ORDER — DIPHENHYDRAMINE HCL 50 MG/ML IJ SOLN
50.0000 mg | Freq: Once | INTRAMUSCULAR | Status: AC
  Administered 2017-04-02: 50 mg via INTRAVENOUS
  Filled 2017-04-02: qty 1

## 2017-04-02 NOTE — ED Notes (Signed)
Patient transported to MRI 

## 2017-04-02 NOTE — Telephone Encounter (Signed)
Pt calling because on her left side face, arms legs there is heavyness, weakness and numbness.  Pt said so much so that she is unable to pick up a pencil.  She has agreed to 1st available appointment in July but would like to see if there is anything that Dr [name] could suggest to aide her in waiting to be seen. Pt was asked if she needed to go to ED, she stated she did not feel it was  That type of an ordeal but will if she needs to.  Pt said yesterday her bloodpressure was 160/100 this morning it was 138/76 and it is normally 120/70 please call.

## 2017-04-02 NOTE — ED Notes (Signed)
ED Provider at bedside. 

## 2017-04-02 NOTE — ED Provider Notes (Addendum)
[facility] Emergency Department Provider Note  ____________________________________________   First MD Initiated Contact with Patient 04/02/17 1207     (approximate)  I have reviewed the triage vital signs and the nursing notes.   HISTORY  Chief Complaint Numbness   HPI Rachel Fry is a 61 y.o. female with a history of compensated migraine with left-sided weakness as well as numbness over the past year who is returning with worsening of her symptoms over the past week. She is reporting a 5 out of 10 throbbing left temporal headache associated with blurred vision as well as nausea and increased left-sided weakness and numbness over the past 7 days. She says that she gets trigger point injections as well as Botox and she is only headache free about 3-4 days per month. However, she says that her symptoms have worsened over the past week from her baseline. 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 She says that the blurred vision is bilateral.   Past Medical History:  Diagnosis Date  . Allergy   . Migraines   . Numbness and tingling    left side of body, occasionally right side   . S/P Botox injection     Patient Active Problem List   Diagnosis Date Noted  . Lower back pain 04/02/2016  . Acute stress disorder 02/25/2015  . Anxiety 02/25/2015  . Airway hyperreactivity 02/25/2015  . Bradycardia 02/25/2015  . Hypersomnia 02/25/2015  . Clinical depression 02/25/2015  . Elevated blood sugar 02/25/2015  . 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 Acid reflux 02/25/2015  . 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 H/O disease 02/25/2015  . Hypercholesteremia 02/25/2015  . Headache, migraine 02/25/2015  . Muscle ache 02/25/2015  . Allergic rhinitis, seasonal 02/25/2015  . Avitaminosis D 02/25/2015    Past Surgical History:  Procedure Laterality Date  . ARM NEUROPLASTY Right 1994 and 1996   for chronic pain  . CESAREAN SECTION    .  OVARIAN CYST REMOVAL  1973  . TONSILLECTOMY AND ADENOIDECTOMY    . TUBAL LIGATION  1988    Prior to Admission medications   Medication Sig Start Date End Date Taking? Authorizing Provider  ALPRAZolam (XANAX) 0.25 MG tablet Take 1 tablet (0.25 mg total) by mouth up to 2 (two) times daily only as needed. 10/13/16  Yes Melvenia Beam, MD  cetirizine (ZYRTEC) 10 MG tablet Take by mouth. 02/04/12  Yes [provider]  EPINEPHrine 0.3 mg/0.3 mL IJ SOAJ injection 0.3 mg once.  10/15/16  Yes [provider]  fluticasone (FLOVENT HFA) 110 MCG/ACT inhaler Inhale 2 puffs into the lungs daily.    Yes [provider]  lansoprazole (PREVACID) 15 MG capsule Take 15 mg by mouth daily.    Yes [provider]  lansoprazole (PREVACID) 30 MG capsule Take 30 mg by mouth daily. 03/24/17  Yes [provider]  TRIAMCINOLONE ACETONIDE, NASAL STEROIDS, Place 2 sprays into the nose at bedtime.  02/04/12  Yes [provider]  Vitamin D, Ergocalciferol, (DRISDOL) 50000 units CAPS capsule Take one capsule every other week. 04/09/16  Yes Margarita Rana, MD  zafirlukast (ACCOLATE) 10 MG tablet Take 10 mg by mouth 2 (two) times daily.  02/04/12  Yes [provider]  atorvastatin (LIPITOR) 20 MG tablet TAKE 1 TABLET (20 MG TOTAL) BY MOUTH DAILY. 09/24/16   Birdie Sons, MD  botulinum toxin Type A (BOTOX) 100 UNITS SOLR injection Botox - Historical  Medication  once every 3 months for migraines Active    [provider]  frovatriptan (FROVA) 2.5 MG tablet Take 2.5 mg by mouth as needed.     [provider]  meclizine (ANTIVERT) 25 MG tablet Take 1 tablet (25 mg total) by mouth 3 (three) times daily as needed for dizziness. Patient not taking: Reported on 12/10/2016 03/20/16   Carmon Ginsberg, PA  predniSONE (DELTASONE) 10 MG tablet Take 6 tablets  today, on day 2 take 5 tablets, day 3 take 4 tablets, day 4 take 3 tablets, day 5 take  2 tablets and 1  tablet the last day 11/17/16   Johnn Hai, PA-C    Allergies Codeine; Fish oil; and Sulfa antibiotics  Family History  Problem Relation Age of Onset  . Hypertension Mother   . Breast cancer Mother 69  . Thyroid disease Mother   . Stroke Mother   . Dementia Mother   . Cardiomyopathy Father   . Peripheral Artery Disease Father   . Hyperlipidemia Brother   . Hypertension Brother   . Hypertension Brother   . Hyperlipidemia Brother   . Thyroid disease Brother   . Hypertension Brother   . Birth defects Brother   . Congenital heart disease Brother   . Cancer Brother        Kidney cancer  . Colon cancer Maternal Grandmother   . Stroke Paternal Grandfather   . Neuropathy Neg Hx   . Multiple sclerosis Neg Hx   . Migraines Neg Hx     Social History Social History  Substance Use Topics  . Smoking status: Never Smoker  . Smokeless tobacco: Never Used  . Alcohol use No    Review of Systems  Constitutional: No fever/chills Eyes: as above ENT: No sore throat. Cardiovascular: Denies chest pain. Respiratory: Denies shortness of breath. Gastrointestinal: No abdominal pain.   no vomiting.  No diarrhea.  No constipation. Genitourinary: Negative for dysuria. Musculoskeletal: Negative for back pain. Skin: Negative for rash. Neurological: as above   ____________________________________________   PHYSICAL EXAM:  VITAL SIGNS: ED Triage Vitals  Enc Vitals Group     BP 04/02/17 1131 (!) 159/87     Pulse Rate 04/02/17 1131 78     Resp 04/02/17 1131 16     Temp 04/02/17 1131 97.9 F (36.6 C)     Temp Source 04/02/17 1131 Oral     SpO2 04/02/17 1131 100 %     Weight 04/02/17 1131 158 lb (71.7 kg)     Height 04/02/17 1131 5\' 7"  (1.702 m)     Head Circumference --      Peak Flow --      Pain Score 04/02/17 1224 5     Pain Loc --      Pain Edu? --      Excl. in [place]? --     Constitutional: Alert and oriented. Well appearing and in no acute distress. Eyes:  Conjunctivae are normal.  Pupils are equal. Head: Atraumatic.No tenderness or nodularity along the distributions of the bilateral temporal arteries. Nose: No congestion/rhinnorhea. Mouth/Throat: Mucous membranes are moist.  Neck: No stridor.   Cardiovascular: Normal rate, regular rhythm. Grossly normal heart sounds.  Good peripheral circulation With equal and bilateral radial as well as dorsalis pedis pulses. Respiratory: Normal respiratory effort.  No retractions. Lungs CTAB. Gastrointestinal: Soft and nontender. No distention.  Musculoskeletal: No lower extremity tenderness nor edema.  No joint effusions. Neurologic:  Normal speech and language. Decreased sensation  to light touch to the left side of the face as well as the left upper and lower extremities. Right sided strength is 5 out of 5 throughout. Week 5 out of 5 to strong 4 out of 5 left upper as well as lower extremities. No ataxia on finger to nose testing. Skin:  Skin is warm, dry and intact. No rash noted. Psychiatric: Mood and affect are normal. Speech and behavior are normal.  ____________________________________________   LABS (all labs ordered are listed, but only abnormal results are displayed)  Labs Reviewed  URINALYSIS, COMPLETE (UACMP) WITH MICROSCOPIC - Abnormal; Notable for the following:       Result Value   Color, Urine YELLOW (*)    APPearance CLEAR (*)    All other components within normal limits  BASIC METABOLIC PANEL  CBC  SEDIMENTATION RATE  CBG MONITORING, ED   ____________________________________________  EKG  ED ECG REPORT I, Doran Stabler, the attending physician, personally viewed and interpreted this ECG.   Date: 04/02/2017  EKG Time: 1134  Rate: 70  Rhythm: normal sinus rhythm  Axis: Normal axis  Intervals:none  ST&T Change: No ST segment elevation or depression. No abnormal T-wave inversion.  ____________________________________________  RADIOLOGY   No acute findings on the CT  nor MRI. ____________________________________________   PROCEDURES  Procedure(s) performed:   Procedures  Critical Care performed:   ____________________________________________   INITIAL IMPRESSION / ASSESSMENT AND PLAN / ED COURSE  Pertinent labs & imaging results that were available during my care of the patient were reviewed by me and considered in my medical decision making (see chart for details).   Clinical Course as of Apr 03 1531  Fri Apr 02, 2017  1352 Patient says only mild improvement in numbness and weakness after the migraine cocktail. However, she does say that the headache is now a 2 out of 5. We will proceed with MRI to make sure there is no sign of acute infarction. Also, the sedimentation rate was normal and the patient does not have any tenderness or nodularity over the temporal regions. Unlikely to be giant cell arteritis.  I discussed the case with Dr. Doy Mince and she agrees that the patient should be appropriate for discharge to home as long as the MRI does not show any acute pathology.  [DS]    Clinical Course User Index [DS] Orbie Pyo, MD   ----------------------------------------- 3:32 PM on 04/02/2017 -----------------------------------------  Patient without any distress at this time. Reports mild improvement in symptoms. Says that she has an appointment next week with a new primary care doctor as well as an appointment with her neurologist in 4 weeks, Dr. [name] in [place]. We discussed the imaging results and that there does not appear to be any cause of her symptoms that were found today in the emergency department that would require further testing or admission at this time. We discussed the plan for outpatient follow-up and she is understanding and willing to comply. She says that she is on a wait list with Dr. [name] for a sooner appointment.  Likely, complicated migraine.  ____________________________________________   FINAL  CLINICAL IMPRESSION(S) / ED DIAGNOSES  Final diagnoses:  Left-sided headache  Weakness  Numbness      NEW MEDICATIONS STARTED DURING THIS VISIT:  New Prescriptions   No medications on file     Note:  This document was prepared using Dragon voice recognition software and may include unintentional dictation errors.     Jumana Paccione, Randall An, MD  04/02/17 [place], Randall An, MD 04/02/17 1534

## 2017-04-02 NOTE — Telephone Encounter (Signed)
I really have nothing more to offer patient. I think this is migrainous and she needs to see Dr. Domingo Cocking.  If she insists on following up with [place] please change her to Carolyn's schedule. thanks

## 2017-04-02 NOTE — Telephone Encounter (Addendum)
Pt was seen once by Dr. [name] as a new patient last June for neck pain and numbness. MRI was essentially unremarkable at the time. She has since been seeing Dr. Domingo Cocking for migraine management. Appears that pt did go to the ER today to be evaluated. CT scan showed normal exam, unchanged. She is currently having MRI performed. She called earlier today and it was suggested by phone staff to go to ER. She scheduled a follow-up appt w/ Dr. [name] on 7/17 d/t numbness, weakness and BP changes.

## 2017-04-02 NOTE — ED Triage Notes (Signed)
C/O left sided numbness and tingling.  Patient states she has been experiencing these symptoms for about one year.  States symptoms worsened over the past 2 days.

## 2017-04-05 NOTE — Telephone Encounter (Signed)
MRI also showed no acute or interval findings. Called pt and left VM mssg letting her know that if she is still seeing Dr. Domingo Cocking for migraines, she does not have to continue to be followed at [facility] too. If she still does want a sooner appt w/ [place], she may call back for a sooner appt w/ Hoyle Sauer.

## 2017-04-07 DIAGNOSIS — E559 Vitamin D deficiency, unspecified: Secondary | ICD-10-CM | POA: Diagnosis not present

## 2017-04-07 DIAGNOSIS — R202 Paresthesia of skin: Secondary | ICD-10-CM | POA: Diagnosis not present

## 2017-04-07 DIAGNOSIS — E785 Hyperlipidemia, unspecified: Secondary | ICD-10-CM | POA: Diagnosis not present

## 2017-04-07 DIAGNOSIS — E663 Overweight: Secondary | ICD-10-CM | POA: Diagnosis not present

## 2017-04-07 DIAGNOSIS — J309 Allergic rhinitis, unspecified: Secondary | ICD-10-CM | POA: Diagnosis not present

## 2017-04-08 DIAGNOSIS — R202 Paresthesia of skin: Secondary | ICD-10-CM | POA: Diagnosis not present

## 2017-04-08 DIAGNOSIS — J309 Allergic rhinitis, unspecified: Secondary | ICD-10-CM | POA: Diagnosis not present

## 2017-04-08 DIAGNOSIS — E785 Hyperlipidemia, unspecified: Secondary | ICD-10-CM | POA: Diagnosis not present

## 2017-04-08 DIAGNOSIS — E559 Vitamin D deficiency, unspecified: Secondary | ICD-10-CM | POA: Diagnosis not present

## 2017-04-21 ENCOUNTER — Telehealth: Payer: Self-pay | Admitting: Neurology

## 2017-04-21 NOTE — Telephone Encounter (Signed)
Patient has been scheduled to see Cecille Rubin, NP 04/22/17 per Dr. [name] OK for patient to see NP for left sided tingling and numbness.

## 2017-04-21 NOTE — Telephone Encounter (Signed)
Noted. Pt has been seen for paresthesias by Dr. [name].

## 2017-04-21 NOTE — Telephone Encounter (Signed)
Patient called office in reference to having left sided tingling and numbness with trouble using arm/hand ongoing patient was scheduled to be seen with Dr. [name] for 05/11/17, but has been canceled per Dr. [name].  Patient was advised to continue seeing Dr. Domingo Cocking, but patient contacted Dr. Kirstie Mirza office and he would like patient to follow up with Dr. [name].  Please call

## 2017-04-21 NOTE — Telephone Encounter (Signed)
Patient to schedule follow-up w/ nurse practitioner per Dr. [name].

## 2017-04-22 ENCOUNTER — Ambulatory Visit (INDEPENDENT_AMBULATORY_CARE_PROVIDER_SITE_OTHER): Payer: 59 | Admitting: Nurse Practitioner

## 2017-04-22 ENCOUNTER — Encounter: Payer: Self-pay | Admitting: Nurse Practitioner

## 2017-04-22 VITALS — BP 135/81 | HR 73 | Ht 67.0 in | Wt 160.8 lb

## 2017-04-22 DIAGNOSIS — R531 Weakness: Secondary | ICD-10-CM | POA: Insufficient documentation

## 2017-04-22 DIAGNOSIS — R200 Anesthesia of skin: Secondary | ICD-10-CM | POA: Insufficient documentation

## 2017-04-22 DIAGNOSIS — M791 Myalgia, unspecified site: Secondary | ICD-10-CM

## 2017-04-22 DIAGNOSIS — R202 Paresthesia of skin: Secondary | ICD-10-CM

## 2017-04-22 DIAGNOSIS — G43909 Migraine, unspecified, not intractable, without status migrainosus: Secondary | ICD-10-CM

## 2017-04-22 DIAGNOSIS — R29898 Other symptoms and signs involving the musculoskeletal system: Secondary | ICD-10-CM | POA: Diagnosis not present

## 2017-04-22 NOTE — Patient Instructions (Signed)
Aimovig discuss with [facility] EEG  /EMG F/U as needed

## 2017-04-22 NOTE — Progress Notes (Signed)
[facility]  PATIENT: Rachel Fry DOB: 08-09-1956   REASON FOR VISIT: follow up for hemiplegic migraines HISTORY FROM: Patient    HISTORY OF PRESENT ILLNESS:UPDATE 04/22/17 CM Rachel Fry, 61 year old female returns for follow-up with continued left-sided migraine. She says she has a headache every day.  She gets Botox every 3 months, and trigger point injections every month at [facility]. She also has balance problems no falls. She has been seen by ENT and diagnosed with a vestibular problem. She was seen in the ED on 04/02/2017 for migraine and continued left-sided numbness and heaviness of the left side. MRI of the brain was normal. Previous cervical MRI was essentially normal except for some mild arthritic changes expected for age. The spinal cord was normal . She claims she has been on multiple preventives in the past. She occasionally takes Frova and sometimes it helps. She returns for reevaluation   04/07/16 Rachel Fry is a 61 y.o. female here as a referral from Dr. Venia Minks for migraines. Past medical history of high cholesterol, migraine, fibromyalgia, pre-diabetes. In 1993 she had a right shoulder injury with chronic pain that radiates to the head, triggers migraines left-sided migraines. She has done trigger, dry needling, She has pain "head to toe" if you just touch her lightly it hurts everywhere, like you touch a bruise, literally everywhere, headache is always there too. She has balance problems. She has right ear diagnosed vestibular problem.  On the left side, she feels tingling, electric, pulsating, like a TENs unit on her left side.  She has "zinging" on the left side of her body. This is new. She is on a prednisone taper right now. Weakness on the left side as well. Acute onset and progressively worsening. She also has a heaviness in her left eye and vision is blurry. Her headaches are on the left side. Feels like an ache on her left side of her face,  continuous and daily. Light and sound sensitivity has never been much of an issue with her daily headaches. She has trigger injections with Dr. Domingo Cocking. She is off balance. She got out of bed one morning and couldn't walk straight thought it was her migraines, saw ENT. She has daily headaches. Dan [place] was tried before the vestibular migraine started so unsure if it caused it. The last 6 weeks the dizziness is worse. Feels like she is on a boat. She is having episodes of spinning. Her let leg is heavy. She hs a long history of migraines and records from [facility] are extensive, need to review. Here with her husband who also provides information. Reports no other inciting events, no head trauma, no falls. The right side of her body is fine since his everything is on the left side.  REVIEW OF SYSTEMS: Full 14 system review of systems performed and notable only for those listed, all others are neg:  Constitutional: neg  Cardiovascular: neg Ear/Nose/Throat: neg  Skin: neg Eyes: Blurred vision Respiratory: neg Gastroitestinal: neg  Hematology/Lymphatic: neg  Endocrine: neg Musculoskeletal:neg Allergy/Immunology: Environmental allergies Neurological: Chronic headache numbness and weakness Psychiatric: neg Sleep : neg   ALLERGIES: Allergies  Allergen Reactions  . Flonase  [Fluticasone Propionate] Other (See Comments)  . Latex Itching  . Codeine     Rash/vomiting  . Fish Oil Itching  . Sulfa Antibiotics     abdominal pain    HOME MEDICATIONS: Outpatient Medications Prior to Visit  Medication Sig Dispense Refill  . ALPRAZolam Rachel Fry)  0.25 MG tablet Take 1 tablet (0.25 mg total) by mouth up to 2 (two) times daily only as needed. 30 tablet 1  . atorvastatin (LIPITOR) 20 MG tablet TAKE 1 TABLET (20 MG TOTAL) BY MOUTH DAILY. 90 tablet 3  . botulinum toxin Type A (BOTOX) 100 UNITS SOLR injection Botox - Historical Medication  once every 3 months for migraines Active    . cetirizine  (ZYRTEC) 10 MG tablet Take by mouth.    . EPINEPHrine 0.3 mg/0.3 mL IJ SOAJ injection 0.3 mg once.   1  . fluticasone (FLOVENT HFA) 110 MCG/ACT inhaler Inhale 2 puffs into the lungs daily.     . frovatriptan (FROVA) 2.5 MG tablet Take 2.5 mg by mouth as needed.     . lansoprazole (PREVACID) 30 MG capsule Take 30 mg by mouth daily.  12  . meclizine (ANTIVERT) 25 MG tablet Take 1 tablet (25 mg total) by mouth 3 (three) times daily as needed for dizziness. 21 tablet 0  . TRIAMCINOLONE ACETONIDE, NASAL STEROIDS, Place 2 sprays into the nose at bedtime.     . Vitamin D, Ergocalciferol, (DRISDOL) 50000 units CAPS capsule Take one capsule every other week. 6 capsule 2  . zafirlukast (ACCOLATE) 10 MG tablet Take 10 mg by mouth 2 (two) times daily.     . lansoprazole (PREVACID) 15 MG capsule Take 15 mg by mouth daily.     . predniSONE (DELTASONE) 10 MG tablet Take 6 tablets  today, on day 2 take 5 tablets, day 3 take 4 tablets, day 4 take 3 tablets, day 5 take  2 tablets and 1 tablet the last day 21 tablet 0   No facility-administered medications prior to visit.     PAST MEDICAL HISTORY: Past Medical History:  Diagnosis Date  . Allergy   . Migraines   . Numbness and tingling    left side of body, occasionally right side   . S/P Botox injection     PAST SURGICAL HISTORY: Past Surgical History:  Procedure Laterality Date  . ARM NEUROPLASTY Right 1994 and 1996   for chronic pain  . CESAREAN SECTION    . OVARIAN CYST REMOVAL  1973  . TONSILLECTOMY AND ADENOIDECTOMY    . TUBAL LIGATION  1988    FAMILY HISTORY: Family History  Problem Relation Age of Onset  . Hypertension Mother   . Breast cancer Mother 7  . Thyroid disease Mother   . Stroke Mother   . Dementia Mother   . Cardiomyopathy Father   . Peripheral Artery Disease Father   . Hyperlipidemia Brother   . Hypertension Brother   . Hypertension Brother   . Hyperlipidemia Brother   . Thyroid disease Brother   . Hypertension  Brother   . Birth defects Brother   . Congenital heart disease Brother   . Cancer Brother        Kidney cancer  . Colon cancer Maternal Grandmother   . Stroke Paternal Grandfather   . Neuropathy Neg Hx   . Multiple sclerosis Neg Hx   . Migraines Neg Hx     SOCIAL HISTORY: Social History   Social History  . Marital status: Married    Spouse name: [name]  . Number of children: 3  . Years of education: 78   Occupational History  . Cardiab rehab    Social History Main Topics  . Smoking status: Never Smoker  . Smokeless tobacco: Never Used  . Alcohol use No  . Drug use:  No  . Sexual activity: Not on file   Other Topics Concern  . Not on file   Social History Narrative   Lives with husband   Caffeine use: none     PHYSICAL EXAM  Vitals:   04/22/17 1005  BP: 135/81  Pulse: 73  Weight: 160 lb 12.8 oz (72.9 kg)  Height: 5\' 7"  (1.702 m)   Body mass index is 25.18 kg/m.  Generalized: Well developed, in no acute distress  Head: normocephalic and atraumatic,. Oropharynx benign  Neck: Supple,  Cardiac: Regular rate rhythm, no murmur  Musculoskeletal: No deformity   Neurological examination   Mentation: Alert oriented to time, place, history taking. Attention span and concentration appropriate. Recent and remote memory intact.  Follows all commands speech and language fluent.   Cranial nerve II-XII: Pupils were equal round reactive to light extraocular movements were full, visual field were full on confrontational test. Facial sensation and strength were normal. hearing was intact to finger rubbing bilaterally. Uvula tongue midline. head turning and shoulder shrug were normal and symmetric.Tongue protrusion into cheek strength was normal. Motor: normal bulk and tone, full strength in the BUE, BLE, fine finger movements normal, no pronator drift.  Sensory: Left hemisensory loss   Coordination: finger-nose-finger, heel-to-shin bilaterally, no dysmetria Reflexes:  Brachioradialis 2/2, biceps 2/2, triceps 2/2, patellar 2/2, Achilles 2/2, plantar responses were flexor bilaterally. Gait and Station: Rising up from seated position without assistance, normal stance,  moderate stride, good arm swing, smooth turning, able to perform tiptoe, and heel walking without difficulty. Tandem gait is mildly unsteady  DIAGNOSTIC DATA (LABS, IMAGING, TESTING) - I reviewed patient records, labs, notes, testing and imaging myself where available.  Lab Results  Component Value Date   WBC 5.6 04/02/2017   HGB 14.2 04/02/2017   HCT 42.4 04/02/2017   MCV 90.6 04/02/2017   PLT 204 04/02/2017      Component Value Date/Time   NA 139 04/02/2017 1133   NA 142 12/10/2016 1512   NA 139 08/29/2014 1112   K 3.6 04/02/2017 1133   K 4.3 08/29/2014 1112   CL 103 04/02/2017 1133   CL 103 08/29/2014 1112   CO2 30 04/02/2017 1133   CO2 33 (H) 08/29/2014 1112   GLUCOSE 85 04/02/2017 1133   GLUCOSE 101 (H) 08/29/2014 1112   BUN 15 04/02/2017 1133   BUN 12 12/10/2016 1512   BUN 17 08/29/2014 1112   CREATININE 0.83 04/02/2017 1133   CREATININE 0.98 08/29/2014 1112   CALCIUM 10.0 04/02/2017 1133   CALCIUM 9.3 08/29/2014 1112   PROT 6.4 12/10/2016 1512   PROT 7.1 08/29/2014 1112   ALBUMIN 4.3 12/10/2016 1512   ALBUMIN 3.9 08/29/2014 1112   AST 19 12/10/2016 1512   AST 19 08/29/2014 1112   ALT 20 12/10/2016 1512   ALT 23 08/29/2014 1112   ALKPHOS 78 12/10/2016 1512   ALKPHOS 69 08/29/2014 1112   BILITOT 0.5 12/10/2016 1512   BILITOT 0.3 08/29/2014 1112   GFRNONAA >60 04/02/2017 1133   GFRNONAA >60 08/29/2014 1112   GFRNONAA >60 08/22/2013 1103   GFRAA >60 04/02/2017 1133   GFRAA >60 08/29/2014 1112   GFRAA >60 08/22/2013 1103   Lab Results  Component Value Date   CHOL 201 (H) 12/10/2016   HDL 66 12/10/2016   LDLCALC 123 (H) 12/10/2016   TRIG 61 12/10/2016   CHOLHDL 3.0 12/10/2016   Lab Results  Component Value Date   HGBA1C 5.4 12/10/2016   Lab Results  Component Value Date   SMOLMBEM75 449 04/02/2016   Lab Results  Component Value Date   TSH 3.640 12/10/2016     ASSESSMENT AND PLAN  61year-old female with a significant history of migraines, dizziness and vestibular symptoms who presents with acute onset of left sided weakness, sensory symptoms. Exam significant for left-sided weakness and hemisensory loss. MRI of the brain and MRA of the head were unremarkable.in June 2017.  Repeat MRI of the brain in June 2018 was normal . MRI of the cervical spine was normal.    PLAN:Discussed with Dr. [name] who came in and talked to pt. she still feels this is hemiplegic migraine. Aimovig is a new  treatment for migraines discuss with [facility] EEG to rule out seizure activity [place] /EMG to rule out nerve or muscle abnormalities F/U as needed  Dennie Bible, [facility], [facility], APRN  [facility] [address], [place], [place] [address] [phone]

## 2017-04-27 DIAGNOSIS — M542 Cervicalgia: Secondary | ICD-10-CM | POA: Diagnosis not present

## 2017-04-27 DIAGNOSIS — R51 Headache: Secondary | ICD-10-CM | POA: Diagnosis not present

## 2017-04-27 DIAGNOSIS — G43019 Migraine without aura, intractable, without status migrainosus: Secondary | ICD-10-CM | POA: Diagnosis not present

## 2017-04-27 DIAGNOSIS — M791 Myalgia: Secondary | ICD-10-CM | POA: Diagnosis not present

## 2017-04-27 DIAGNOSIS — G43719 Chronic migraine without aura, intractable, without status migrainosus: Secondary | ICD-10-CM | POA: Diagnosis not present

## 2017-05-06 ENCOUNTER — Ambulatory Visit (INDEPENDENT_AMBULATORY_CARE_PROVIDER_SITE_OTHER): Payer: Self-pay | Admitting: Neurology

## 2017-05-06 ENCOUNTER — Ambulatory Visit (INDEPENDENT_AMBULATORY_CARE_PROVIDER_SITE_OTHER): Payer: 59 | Admitting: Neurology

## 2017-05-06 DIAGNOSIS — Z0289 Encounter for other administrative examinations: Secondary | ICD-10-CM

## 2017-05-06 DIAGNOSIS — R202 Paresthesia of skin: Secondary | ICD-10-CM

## 2017-05-06 DIAGNOSIS — R29898 Other symptoms and signs involving the musculoskeletal system: Secondary | ICD-10-CM

## 2017-05-06 DIAGNOSIS — G43909 Migraine, unspecified, not intractable, without status migrainosus: Secondary | ICD-10-CM

## 2017-05-06 DIAGNOSIS — R200 Anesthesia of skin: Secondary | ICD-10-CM

## 2017-05-06 DIAGNOSIS — R531 Weakness: Secondary | ICD-10-CM

## 2017-05-06 DIAGNOSIS — M791 Myalgia, unspecified site: Secondary | ICD-10-CM

## 2017-05-06 NOTE — Procedures (Signed)
Full Name: Rachel Fry Gender: Female MRN #: [id number] Date of Birth: 1956/07/14    Visit Date: 05/06/2017 09:05 Age: 61 Years 81 Months Old Examining Physician: Sarina Ill, MD  Referring Physician: Margarita Rana, MD  History: Patient with left-sided weakness and sensory changes likely migrainous but here for evaluation of peripheral causes. Imaging of the brain and cervical spine have been unremarkable. EEG normal.   Summary:   Nerve Conduction/EMG Studies were performed on the left upper and left lower extremities.  The left median APB motor nerve showed prolonged distal onset latency (4.5 ms, N<4.4).  The left median/ulnar (palm) comparison nerve showed prolonged distal peak latency (Median Palm, 2.45 ms, N<2.2) and abnormal  peak latency difference (Median Palm-Ulnar Palm, 0.4 ms, N<0.4) with a relative median delay.   All remaining nerves (as detailed in the following tables) were within normal limits. All muscles (as detailed in the following tables) were within normal limits.    Conclusion: There is electrophysiologic evidence of mild left carpal tunnel syndrome otherwise exam of the left upper and left lower extremities is normal.  Cc: Margarita Rana, MD, Orie Rout, MD  Sarina Ill M.D.  [facility] [place], [place] [address] Tel: [phone] Fax: [phone]        [facility]    Nerve / Sites Rec. Site Latency Ref. Amplitude Ref. Rel Amp Segments Distance Velocity Ref. Area    ms ms mV mV %  cm m/s m/s mVms  L Median - APB     Wrist APB 4.5 ?4.4 4.1 ?4.0 100 Wrist - APB 7   16.2     Upper arm APB 8.3  3.9  94.5 Upper arm - Wrist 20 52 ?49 14.3  L Ulnar - ADM     Wrist ADM 3.0 ?3.3 10.3 ?6.0 100 Wrist - ADM 7   38.0     B.Elbow ADM 5.8  10.4  101 B.Elbow - Wrist 16 56 ?49 38.3     A.Elbow ADM 8.2  10.8  104 A.Elbow - B.Elbow 12 50 ?49 38.4         A.Elbow - Wrist      L Peroneal - EDB     Ankle EDB 5.0 ?6.5 4.4 ?2.0  100 Ankle - EDB 9   14.3     Fib head EDB 12.0  3.2  73.1 Fib head - Ankle 31 44 ?44 12.5     Pop fossa EDB 14.2  2.9  91.2 Pop fossa - Fib head 10 46 ?44 12.0         Pop fossa - Ankle      L Tibial - AH     Ankle AH 4.5 ?5.8 14.5 ?4.0 100 Ankle - AH 9   42.7     Pop fossa AH 13.6  10.1  69.3 Pop fossa - Ankle 40 44 ?41 38.2             SNC    Nerve / Sites Rec. Site Peak Lat Ref.  Amp Ref. Segments Distance Peak Diff Ref.    ms ms V V  cm ms ms  L Sural - Ankle (Calf)     Calf Ankle 3.39 ?4.40 12 ?6 Calf - Ankle 14    L Superficial peroneal - Ankle     Lat leg Ankle 3.91 ?4.40 6 ?6 Lat leg - Ankle 14    L Median, Ulnar - Transcarpal comparison     Median  Palm Wrist 2.45 ?2.20 70 ?35 Median Palm - Wrist 8       Ulnar Palm Wrist 2.03 ?2.20 48 ?12 Ulnar Palm - Wrist 8          Median Palm - Ulnar Palm  0.4 ?0.4  L Median - Orthodromic (Dig II, Mid palm)     Dig II Wrist 3.23 ?3.40 31 ?10 Dig II - Wrist 13    L Ulnar - Orthodromic, (Dig V, Mid palm)     Dig V Wrist 2.76 ?3.10 19 ?5 Dig V - Wrist 10                 F  Wave    Nerve F Lat Ref.   ms ms  L Tibial - AH 51.7 ?56.0  R Ulnar - ADM 27.2 ?32.0         EMG full       EMG Summary Table    Spontaneous MUAP Recruitment  Muscle IA Fib PSW Fasc Other Amp Dur. Poly Pattern  L. Deltoid Normal None None None _______ Normal Normal Normal Normal  L. Triceps brachii Normal None None None _______ Normal Normal Normal Normal  L. Pronator teres Normal None None None _______ Normal Normal Normal Normal  L. Opponens pollicis Normal None None None _______ Normal Normal Normal Normal  L. First dorsal interosseous Normal None None None _______ Normal Normal Normal Normal  L. Vastus medialis Normal None None None _______ Normal Normal Normal Normal  L. Tibialis anterior Normal None None None _______ Normal Normal Normal Normal  L. Gastrocnemius (Medial head) Normal None None None _______ Normal Normal Normal Normal  L. Biceps femoris  (long head) Normal None None None _______ Normal Normal Normal Normal  L. Gluteus maximus Normal None None None _______ Normal Normal Normal Normal

## 2017-05-06 NOTE — Progress Notes (Signed)
See procedure note.

## 2017-05-06 NOTE — Progress Notes (Signed)
Full Name: Rachel Fry Gender: Female MRN #: [id number] Date of Birth: 05/01/1956    Visit Date: 05/06/2017 09:05 Age: 61 Years 11 Months Old Examining Physician: Sarina Ill, MD  Referring Physician: Margarita Rana, MD  History: Patient with left-sided weakness and sensory changes likely migrainous but here for evaluation of peripheral causes. Imaging of the brain and cervical spine have been unremarkable. EEG normal.   Summary:   Nerve Conduction/EMG Studies were performed on the left upper and left lower extremities.  The left median APB motor nerve showed prolonged distal onset latency (4.5 ms, N<4.4).  The left median/ulnar (palm) comparison nerve showed prolonged distal peak latency (Median Palm, 2.45 ms, N<2.2) and abnormal  peak latency difference (Median Palm-Ulnar Palm, 0.4 ms, N<0.4) with a relative median delay.   All remaining nerves (as detailed in the following tables) were within normal limits. All muscles (as detailed in the following tables) were within normal limits.    Conclusion: There is electrophysiologic evidence of mild left carpal tunnel syndrome otherwise exam of the left upper and left lower extremities is normal.  Cc: Margarita Rana, MD, Orie Rout, MD  Sarina Ill M.D.  [facility] [place], [place] [address] Tel: [phone] Fax: [phone]        [facility]    Nerve / Sites Rec. Site Latency Ref. Amplitude Ref. Rel Amp Segments Distance Velocity Ref. Area    ms ms mV mV %  cm m/s m/s mVms  L Median - APB     Wrist APB 4.5 ?4.4 4.1 ?4.0 100 Wrist - APB 7   16.2     Upper arm APB 8.3  3.9  94.5 Upper arm - Wrist 20 52 ?49 14.3  L Ulnar - ADM     Wrist ADM 3.0 ?3.3 10.3 ?6.0 100 Wrist - ADM 7   38.0     B.Elbow ADM 5.8  10.4  101 B.Elbow - Wrist 16 56 ?49 38.3     A.Elbow ADM 8.2  10.8  104 A.Elbow - B.Elbow 12 50 ?49 38.4         A.Elbow - Wrist      L Peroneal - EDB     Ankle EDB 5.0 ?6.5 4.4 ?2.0  100 Ankle - EDB 9   14.3     Fib head EDB 12.0  3.2  73.1 Fib head - Ankle 31 44 ?44 12.5     Pop fossa EDB 14.2  2.9  91.2 Pop fossa - Fib head 10 46 ?44 12.0         Pop fossa - Ankle      L Tibial - AH     Ankle AH 4.5 ?5.8 14.5 ?4.0 100 Ankle - AH 9   42.7     Pop fossa AH 13.6  10.1  69.3 Pop fossa - Ankle 40 44 ?41 38.2             SNC    Nerve / Sites Rec. Site Peak Lat Ref.  Amp Ref. Segments Distance Peak Diff Ref.    ms ms V V  cm ms ms  L Sural - Ankle (Calf)     Calf Ankle 3.39 ?4.40 12 ?6 Calf - Ankle 14    L Superficial peroneal - Ankle     Lat leg Ankle 3.91 ?4.40 6 ?6 Lat leg - Ankle 14    L Median, Ulnar - Transcarpal comparison     Median  Palm Wrist 2.45 ?2.20 70 ?35 Median Palm - Wrist 8       Ulnar Palm Wrist 2.03 ?2.20 48 ?12 Ulnar Palm - Wrist 8          Median Palm - Ulnar Palm  0.4 ?0.4  L Median - Orthodromic (Dig II, Mid palm)     Dig II Wrist 3.23 ?3.40 31 ?10 Dig II - Wrist 13    L Ulnar - Orthodromic, (Dig V, Mid palm)     Dig V Wrist 2.76 ?3.10 19 ?5 Dig V - Wrist 33                 F  Wave    Nerve F Lat Ref.   ms ms  L Tibial - AH 51.7 ?56.0  R Ulnar - ADM 27.2 ?32.0         EMG full       EMG Summary Table    Spontaneous MUAP Recruitment  Muscle IA Fib PSW Fasc Other Amp Dur. Poly Pattern  L. Deltoid Normal None None None _______ Normal Normal Normal Normal  L. Triceps brachii Normal None None None _______ Normal Normal Normal Normal  L. Pronator teres Normal None None None _______ Normal Normal Normal Normal  L. Opponens pollicis Normal None None None _______ Normal Normal Normal Normal  L. First dorsal interosseous Normal None None None _______ Normal Normal Normal Normal  L. Vastus medialis Normal None None None _______ Normal Normal Normal Normal  L. Tibialis anterior Normal None None None _______ Normal Normal Normal Normal  L. Gastrocnemius (Medial head) Normal None None None _______ Normal Normal Normal Normal  L. Biceps femoris  (long head) Normal None None None _______ Normal Normal Normal Normal  L. Gluteus maximus Normal None None None _______ Normal Normal Normal Normal

## 2017-05-07 ENCOUNTER — Telehealth: Payer: Self-pay | Admitting: *Deleted

## 2017-05-07 NOTE — Telephone Encounter (Signed)
Spoke to pt and relayed that her [place]/emg did show mild L carpal tunnel syndrome, otherwise normal LE upper and lower extemity.  May wear wrist splint at night.  She verbalized understanding.

## 2017-05-07 NOTE — Telephone Encounter (Signed)
-----   Message from Dennie Bible, NP sent at 05/06/2017  1:10 PM EDT ----- Nerve conduction study with evidence of mild left carpal tunnel syndrome otherwise normal left upper and left lower extremity. May will wear wrist splint at night

## 2017-05-08 NOTE — Progress Notes (Signed)
Personally  participated in, made any corrections needed, and agree with history, physical, neuro exam,assessment and plan as stated above.    Tracyann Duffell, MD [facility] 

## 2017-05-11 ENCOUNTER — Ambulatory Visit: Payer: 59 | Admitting: Neurology

## 2017-05-20 ENCOUNTER — Ambulatory Visit (INDEPENDENT_AMBULATORY_CARE_PROVIDER_SITE_OTHER): Payer: 59 | Admitting: Diagnostic Neuroimaging

## 2017-05-20 DIAGNOSIS — R29898 Other symptoms and signs involving the musculoskeletal system: Secondary | ICD-10-CM

## 2017-05-20 DIAGNOSIS — R2990 Unspecified symptoms and signs involving the nervous system: Secondary | ICD-10-CM

## 2017-05-20 DIAGNOSIS — R202 Paresthesia of skin: Secondary | ICD-10-CM

## 2017-05-20 DIAGNOSIS — M797 Fibromyalgia: Secondary | ICD-10-CM | POA: Diagnosis not present

## 2017-05-20 DIAGNOSIS — R200 Anesthesia of skin: Secondary | ICD-10-CM

## 2017-05-20 DIAGNOSIS — E785 Hyperlipidemia, unspecified: Secondary | ICD-10-CM | POA: Diagnosis not present

## 2017-05-21 ENCOUNTER — Other Ambulatory Visit: Payer: Self-pay | Admitting: Specialist

## 2017-05-21 DIAGNOSIS — R519 Headache, unspecified: Secondary | ICD-10-CM

## 2017-05-21 DIAGNOSIS — R51 Headache: Principal | ICD-10-CM

## 2017-05-31 ENCOUNTER — Ambulatory Visit: Payer: 59

## 2017-06-03 DIAGNOSIS — G43719 Chronic migraine without aura, intractable, without status migrainosus: Secondary | ICD-10-CM | POA: Diagnosis not present

## 2017-06-03 DIAGNOSIS — M791 Myalgia: Secondary | ICD-10-CM | POA: Diagnosis not present

## 2017-06-03 DIAGNOSIS — M542 Cervicalgia: Secondary | ICD-10-CM | POA: Diagnosis not present

## 2017-06-03 DIAGNOSIS — G43019 Migraine without aura, intractable, without status migrainosus: Secondary | ICD-10-CM | POA: Diagnosis not present

## 2017-06-03 DIAGNOSIS — R51 Headache: Secondary | ICD-10-CM | POA: Diagnosis not present

## 2017-06-10 ENCOUNTER — Ambulatory Visit
Admission: RE | Admit: 2017-06-10 | Discharge: 2017-06-10 | Disposition: A | Discharge status: Home / Self Care | Payer: 59 | Source: Ambulatory Visit | Attending: Specialist | Admitting: Specialist

## 2017-06-10 DIAGNOSIS — R202 Paresthesia of skin: Secondary | ICD-10-CM | POA: Diagnosis not present

## 2017-06-10 DIAGNOSIS — R51 Headache: Secondary | ICD-10-CM | POA: Diagnosis not present

## 2017-06-10 DIAGNOSIS — R519 Headache, unspecified: Secondary | ICD-10-CM

## 2017-06-10 LAB — CSF CELL COUNT WITH DIFFERENTIAL
RBC COUNT CSF: 237 /mm3 — AB (ref 0–3)
TUBE #: 3
WBC CSF: 0 /mm3 (ref 0–5)

## 2017-06-10 LAB — PROTEIN, CSF: Total  Protein, CSF: 33 mg/dL (ref 15–45)

## 2017-06-10 LAB — CRYPTOCOCCAL ANTIGEN, CSF: Crypto Ag: NEGATIVE

## 2017-06-10 LAB — GLUCOSE, CSF: Glucose, CSF: 59 mg/dL (ref 40–70)

## 2017-06-10 MED ORDER — ACETAMINOPHEN 500 MG PO TABS
1000.0000 mg | ORAL_TABLET | Freq: Four times a day (QID) | ORAL | Status: DC | PRN
  Filled 2017-06-10: qty 2

## 2017-06-10 MED ORDER — LIDOCAINE HCL (PF) 1 % IJ SOLN
5.0000 mL | Freq: Once | INTRAMUSCULAR | Status: AC
  Administered 2017-06-10: 5 mL
  Filled 2017-06-10: qty 5

## 2017-06-11 LAB — VDRL, CSF: SYPHILIS VDRL QUANT CSF: NONREACTIVE

## 2017-06-11 NOTE — Procedures (Signed)
   [facility]  EEG (ELECTROENCEPHALOGRAM) REPORT   STUDY DATE: 05/20/17 PATIENT NAME: Rachel Fry DOB: 1955-12-29 MRN: [id number]  ORDERING CLINICIAN: Sarina Ill, MD   TECHNOLOGIST: Arelia Longest  TECHNIQUE: Electroencephalogram was recorded utilizing standard 10-20 system of lead placement and reformatted into average and bipolar montages.  RECORDING TIME: 22 minutes  ACTIVATION: hyperventilation and photic stimulation  CLINICAL INFORMATION: 61 year old female with left sided numbness.  FINDINGS: Background rhythms of 8-10 hertz and 20-30 microvolts. No focal, lateralizing, epileptiform activity or seizures are seen. Rare sharp transients of sleep noted in left temporal region, which are a normal variant. Patient recorded in the awake, drowsy and asleep state. Vertex waves and sleep spindles noted during sleep. EKG channel shows regular rhythm of 65-70 beats per minute.   IMPRESSION:   Normal EEG in the awake and asleep states.     INTERPRETING PHYSICIAN:  Penni Bombard, MD Certified in Neurology, Neurophysiology and Neuroimaging  [facility] [address] [place], [place] [address] [phone]

## 2017-06-13 LAB — HSV(HERPES SMPLX VRS)ABS-I+II(IGG)-CSF: HSV Type I/II Ab, IgG CSF: 1.29 IV — ABNORMAL HIGH (ref <=0.89)

## 2017-06-13 LAB — ACID FAST SMEAR (AFB, MYCOBACTERIA): Acid Fast Smear: NEGATIVE

## 2017-06-14 ENCOUNTER — Encounter: Payer: Self-pay | Admitting: Nurse Practitioner

## 2017-06-14 LAB — ANGIOTENSIN CONVERTING ENZYME, CSF: ANGIO CONVERT ENZYME: 1 U/L (ref 0.0–2.8)

## 2017-06-18 LAB — MISC LABCORP TEST (SEND OUT): Labcorp test code: 160457

## 2017-06-23 DIAGNOSIS — G518 Other disorders of facial nerve: Secondary | ICD-10-CM | POA: Diagnosis not present

## 2017-06-23 DIAGNOSIS — G43019 Migraine without aura, intractable, without status migrainosus: Secondary | ICD-10-CM | POA: Diagnosis not present

## 2017-06-23 DIAGNOSIS — M791 Myalgia: Secondary | ICD-10-CM | POA: Diagnosis not present

## 2017-06-23 DIAGNOSIS — G43719 Chronic migraine without aura, intractable, without status migrainosus: Secondary | ICD-10-CM | POA: Diagnosis not present

## 2017-06-23 DIAGNOSIS — M542 Cervicalgia: Secondary | ICD-10-CM | POA: Diagnosis not present

## 2017-07-12 LAB — FUNGUS CULTURE WITH STAIN

## 2017-07-12 LAB — FUNGAL ORGANISM REFLEX

## 2017-07-12 LAB — FUNGUS CULTURE RESULT

## 2017-07-15 DIAGNOSIS — R202 Paresthesia of skin: Secondary | ICD-10-CM | POA: Diagnosis not present

## 2017-07-15 DIAGNOSIS — K5900 Constipation, unspecified: Secondary | ICD-10-CM | POA: Diagnosis not present

## 2017-07-15 DIAGNOSIS — E785 Hyperlipidemia, unspecified: Secondary | ICD-10-CM | POA: Diagnosis not present

## 2017-07-15 DIAGNOSIS — G43909 Migraine, unspecified, not intractable, without status migrainosus: Secondary | ICD-10-CM | POA: Diagnosis not present

## 2017-07-19 DIAGNOSIS — G43019 Migraine without aura, intractable, without status migrainosus: Secondary | ICD-10-CM | POA: Diagnosis not present

## 2017-07-19 DIAGNOSIS — G43719 Chronic migraine without aura, intractable, without status migrainosus: Secondary | ICD-10-CM | POA: Diagnosis not present

## 2017-07-19 DIAGNOSIS — R51 Headache: Secondary | ICD-10-CM | POA: Diagnosis not present

## 2017-07-19 DIAGNOSIS — M791 Myalgia: Secondary | ICD-10-CM | POA: Diagnosis not present

## 2017-07-19 DIAGNOSIS — M542 Cervicalgia: Secondary | ICD-10-CM | POA: Diagnosis not present

## 2017-07-22 DIAGNOSIS — J069 Acute upper respiratory infection, unspecified: Secondary | ICD-10-CM | POA: Diagnosis not present

## 2017-08-05 IMAGING — MR MR HEAD W/O CM
10 series · 48 of 48 positions shown · non-contrast
Comparison: 04/01/2016

CLINICAL DATA: Left-sided weakness and numbness.

EXAM:
MRI HEAD WITHOUT CONTRAST
TECHNIQUE: Multiplanar, multiecho pulse sequences of the brain and surrounding
structures were obtained without intravenous contrast.

[Series 2: T1 · sagittal · 5.0mm · 0.45mm/px · 3 of 25 slices shown (1 of 2)]
[im 1/25]
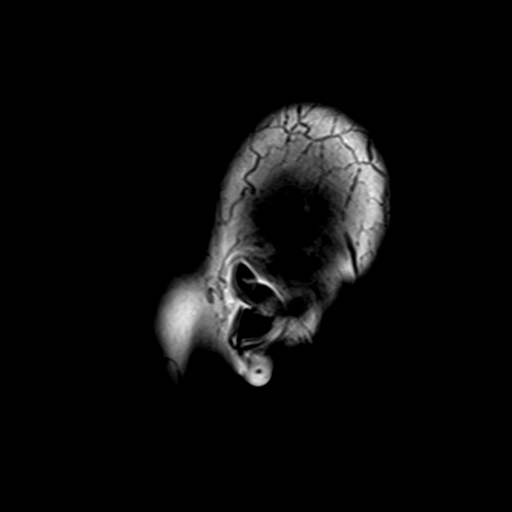
[im 13/25]
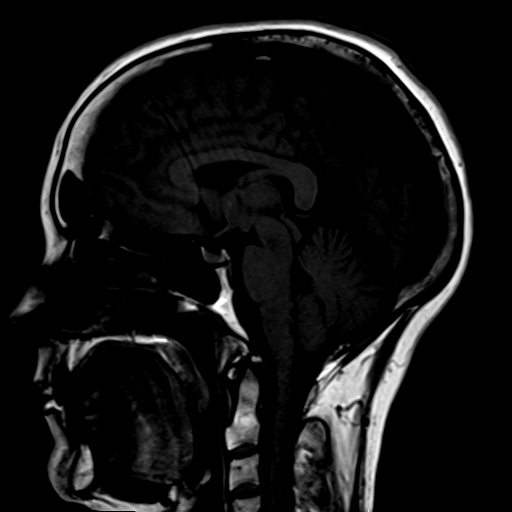
[im 25/25]
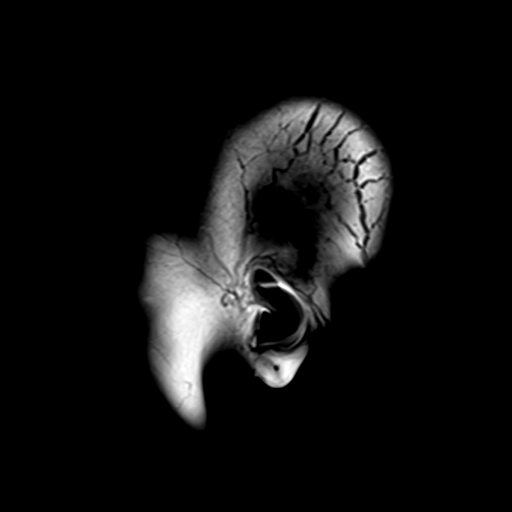

[Series 4: DWI · axial · 3.0mm · 1.80mm/px · z∈[-78,+84]mm · 5 of 53 slices shown (1 of 2)]
[im 1/53]
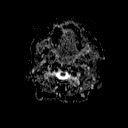
[im 14/53]
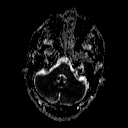
[im 27/53]
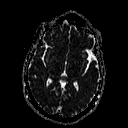
[im 40/53]
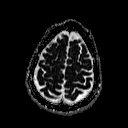
[im 53/53]
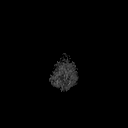

[Series 6: DWI · coronal · 3.0mm · 1.80mm/px · 4 of 45 slices shown (2 of 2)]
[im 1/45]
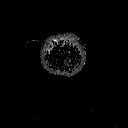
[im 15/45]
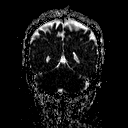
[im 30/45]
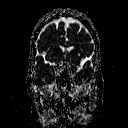
[im 45/45]
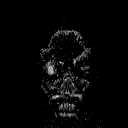

[Series 7: T2 · axial · 5.0mm · 0.60mm/px · z∈[-75,+81]mm · 2 of 25 slices shown (1 of 3)]
[im 1/25]
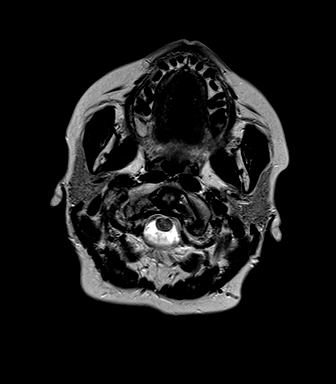
[im 25/25]
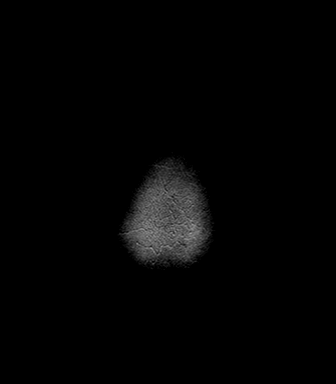

[Series 8: FLAIR · axial · 3.0mm · 0.45mm/px · z∈[-75,+81]mm · 5 of 53 slices shown]
[im 1/53]
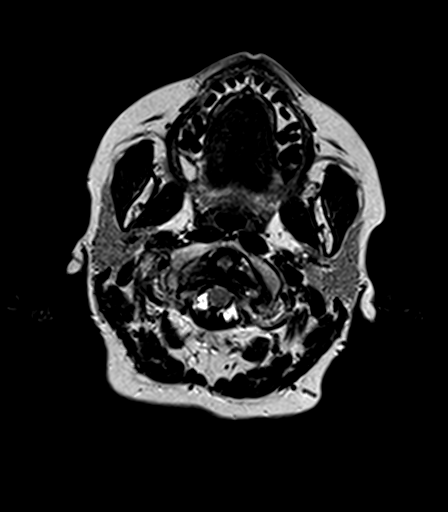
[im 14/53]
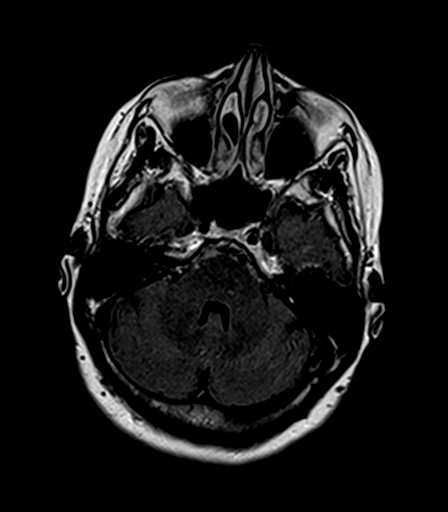
[im 27/53]
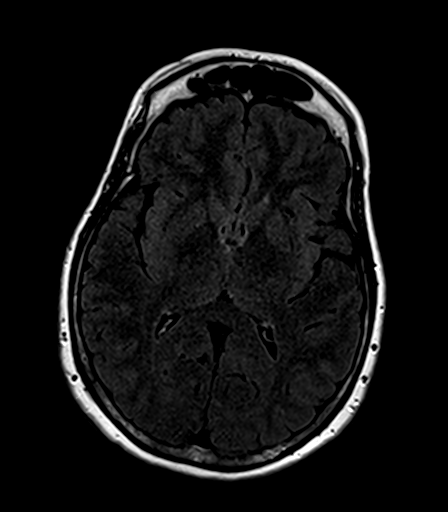
[im 40/53]
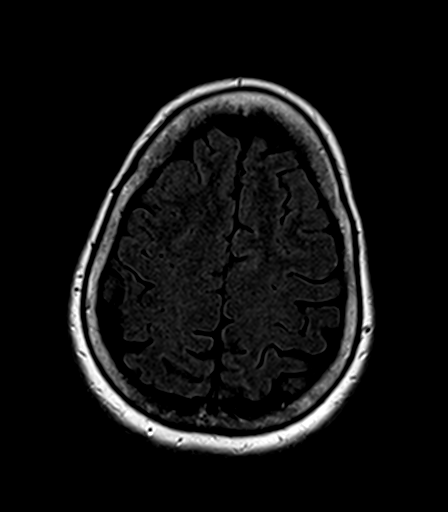
[im 53/53]
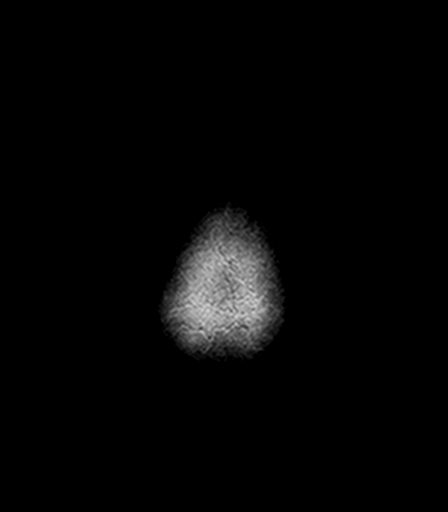

[Series 9: T2 · axial · 5.0mm · 0.45mm/px · z∈[-75,+81]mm · 2 of 25 slices shown (2 of 3)]
[im 1/25]
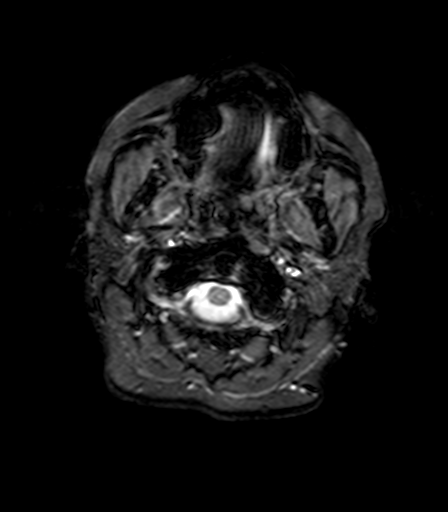
[im 25/25]
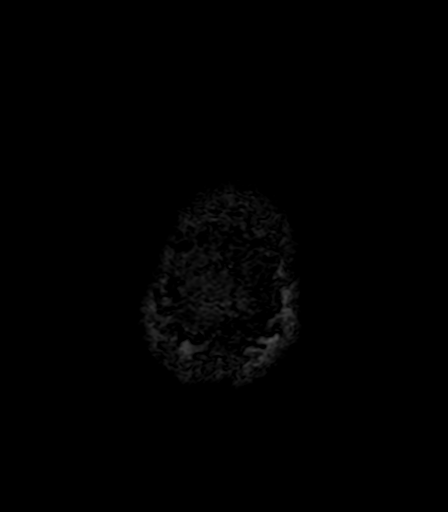

[Series 10: T1 · axial · 1.0mm · 1.00mm/px · z∈[-83,+91]mm · 16 of 176 slices shown (2 of 2)]
[im 1/176]
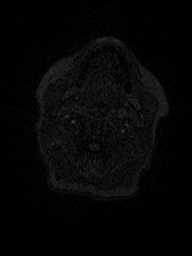
[im 12/176]
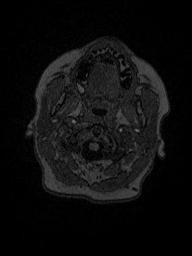
[im 24/176]
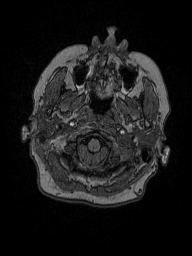
[im 36/176]
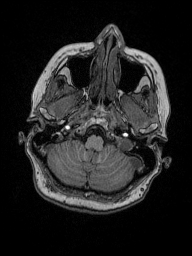
[im 47/176]
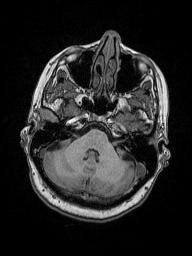
[im 59/176]
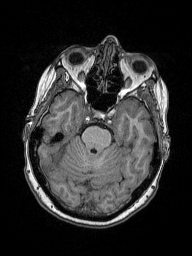
[im 71/176]
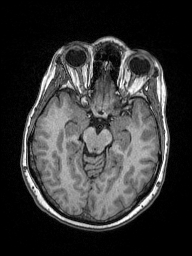
[im 82/176]
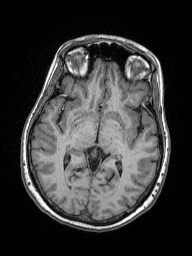
[im 94/176]
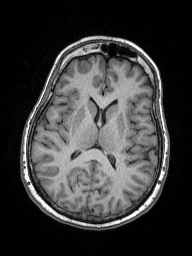
[im 106/176]
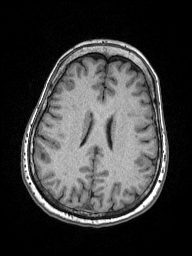
[im 117/176]
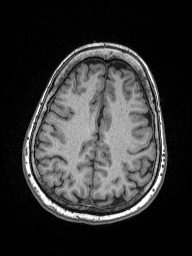
[im 129/176]
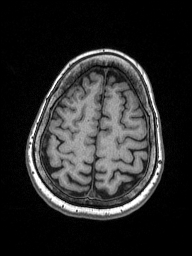
[im 141/176]
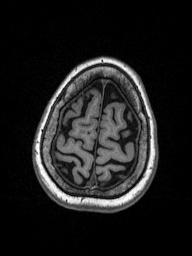
[im 152/176]
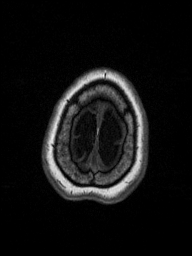
[im 164/176]
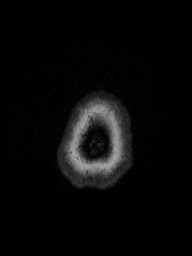
[im 176/176]
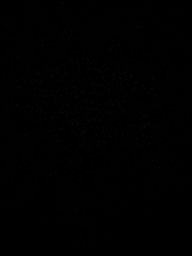

[Series 11: T2 · coronal · 5.0mm · 0.49mm/px · 2 of 27 slices shown (3 of 3)]
[im 1/27]
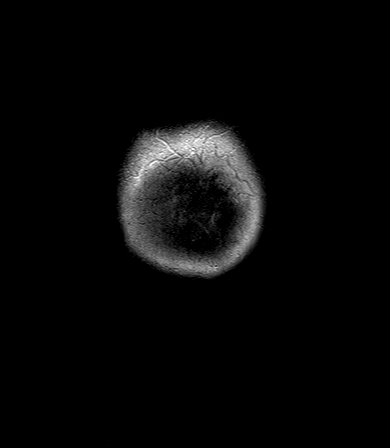
[im 27/27]
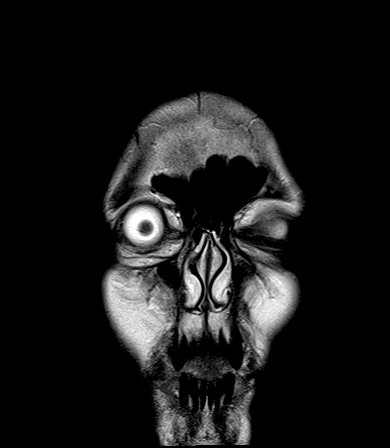

[Series 100: ax (id) · axial · 3.0mm · 1.80mm/px · z∈[-75,+84]mm · 5 of 54 slices shown]
[im 1/54]
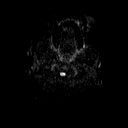
[im 14/54]
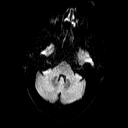
[im 27/54]
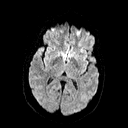
[im 40/54]
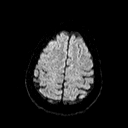
[im 54/54]
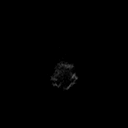

[Series 101: cor (id) · coronal · 3.0mm · 1.80mm/px · 4 of 45 slices shown]
[im 1/45]
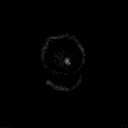
[im 15/45]
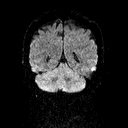
[im 30/45]
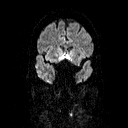
[im 45/45]
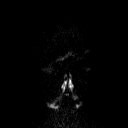

[48 of 48 positions shown; findings below may reference images not displayed]

FINDINGS: Brain: No acute or remote infarction, hemorrhage, hydrocephalus,
extra-axial collection or mass lesion. No atrophy.

There is gradient hypointensity of a left lateral frontal gyrus that
is stable and with underlying developmental venous anomaly on 8681
postcontrast brain MRI. The gradient signal is attributed to
mineralization.

Vascular: Major flow voids are preserved.

Skull and upper cervical spine: Negative for marrow lesion

Sinuses/Orbits: Negative
IMPRESSION: No acute or interval finding. No explanation for left-sided
weakness.

## 2017-08-11 ENCOUNTER — Ambulatory Visit: Payer: Self-pay | Admitting: Physician Assistant

## 2017-08-11 ENCOUNTER — Encounter: Payer: Self-pay | Admitting: Physician Assistant

## 2017-08-11 VITALS — BP 140/90 | HR 77 | Temp 98.5°F | Resp 16

## 2017-08-11 DIAGNOSIS — H6501 Acute serous otitis media, right ear: Secondary | ICD-10-CM

## 2017-08-11 MED ORDER — PREDNISONE 10 MG PO TABS
30.0000 mg | ORAL_TABLET | Freq: Every day | ORAL | 0 refills | Status: DC
Start: 2017-08-11 — End: 2017-12-09

## 2017-08-11 MED ORDER — CEFDINIR 300 MG PO CAPS: 300.0000 mg | ORAL_CAPSULE | Freq: Two times a day (BID) | ORAL | 0 refills | Status: DC

## 2017-08-11 NOTE — Progress Notes (Signed)
S:  C/o ears popping and being stopped up, some pain in r ear, just flew back from [place], had allergy flare while she was there bc it was spring, no drainage from ears, no fever/chills, no cough or congestion, some sinus pressure, remainder ros neg Using otc meds without relief  O:  Vitals wnl, nad, tms dull b/l, r tm is red with fluid, nasal mucosa swollen, throat wnl, neck supple no lymph, lungs c t a, cv rrr, neuro intact  A: acute otitis media, acute eustachean tube dysfunction  P: nasacort, omicef, prednisone 30mg  qd x 3d, return if not improving in 3 to 5 days, return earlier if worsening

## 2017-08-16 ENCOUNTER — Ambulatory Visit: Payer: Self-pay | Admitting: Physician Assistant

## 2017-08-16 VITALS — BP 134/90 | HR 73 | Temp 98.6°F

## 2017-08-16 DIAGNOSIS — H6981 Other specified disorders of Eustachian tube, right ear: Secondary | ICD-10-CM

## 2017-08-16 MED ORDER — METHYLPREDNISOLONE 4 MG PO TBPK: ORAL_TABLET | ORAL | 0 refills | Status: DC

## 2017-08-16 NOTE — Progress Notes (Signed)
S: c/o continued r ear pain, not any better, no fever/chills, no cough or congestion  O: vitals wnl, nad, r ear is still filled with fluid and dull, left tm is wnl, throat wnl neck supple no lymph, lungs c t a, cv rrr  A: dysfunction of eustachean tube  P: medrol dose pack, sudafed, f/u with ENT

## 2017-08-20 DIAGNOSIS — G43719 Chronic migraine without aura, intractable, without status migrainosus: Secondary | ICD-10-CM | POA: Diagnosis not present

## 2017-08-20 DIAGNOSIS — M542 Cervicalgia: Secondary | ICD-10-CM | POA: Diagnosis not present

## 2017-08-20 DIAGNOSIS — G43019 Migraine without aura, intractable, without status migrainosus: Secondary | ICD-10-CM | POA: Diagnosis not present

## 2017-08-20 DIAGNOSIS — M791 Myalgia, unspecified site: Secondary | ICD-10-CM | POA: Diagnosis not present

## 2017-08-20 DIAGNOSIS — R51 Headache: Secondary | ICD-10-CM | POA: Diagnosis not present

## 2017-08-27 DIAGNOSIS — H6980 Other specified disorders of Eustachian tube, unspecified ear: Secondary | ICD-10-CM | POA: Diagnosis not present

## 2017-08-27 DIAGNOSIS — J301 Allergic rhinitis due to pollen: Secondary | ICD-10-CM | POA: Diagnosis not present

## 2017-09-10 DIAGNOSIS — M25531 Pain in right wrist: Secondary | ICD-10-CM | POA: Diagnosis not present

## 2017-09-10 DIAGNOSIS — G8929 Other chronic pain: Secondary | ICD-10-CM | POA: Diagnosis not present

## 2017-09-10 DIAGNOSIS — M19131 Post-traumatic osteoarthritis, right wrist: Secondary | ICD-10-CM | POA: Diagnosis not present

## 2017-09-11 DIAGNOSIS — H9201 Otalgia, right ear: Secondary | ICD-10-CM | POA: Diagnosis not present

## 2017-09-11 DIAGNOSIS — J309 Allergic rhinitis, unspecified: Secondary | ICD-10-CM | POA: Diagnosis not present

## 2017-09-23 DIAGNOSIS — G518 Other disorders of facial nerve: Secondary | ICD-10-CM | POA: Diagnosis not present

## 2017-09-23 DIAGNOSIS — G43719 Chronic migraine without aura, intractable, without status migrainosus: Secondary | ICD-10-CM | POA: Diagnosis not present

## 2017-09-23 DIAGNOSIS — M542 Cervicalgia: Secondary | ICD-10-CM | POA: Diagnosis not present

## 2017-09-23 DIAGNOSIS — M791 Myalgia, unspecified site: Secondary | ICD-10-CM | POA: Diagnosis not present

## 2017-09-23 DIAGNOSIS — G43019 Migraine without aura, intractable, without status migrainosus: Secondary | ICD-10-CM | POA: Diagnosis not present

## 2017-09-29 DIAGNOSIS — H524 Presbyopia: Secondary | ICD-10-CM | POA: Diagnosis not present

## 2017-09-30 DIAGNOSIS — J329 Chronic sinusitis, unspecified: Secondary | ICD-10-CM | POA: Diagnosis not present

## 2017-09-30 DIAGNOSIS — E785 Hyperlipidemia, unspecified: Secondary | ICD-10-CM | POA: Diagnosis not present

## 2017-09-30 DIAGNOSIS — G43909 Migraine, unspecified, not intractable, without status migrainosus: Secondary | ICD-10-CM | POA: Diagnosis not present

## 2017-09-30 DIAGNOSIS — R030 Elevated blood-pressure reading, without diagnosis of hypertension: Secondary | ICD-10-CM | POA: Diagnosis not present

## 2017-10-15 ENCOUNTER — Other Ambulatory Visit: Payer: Self-pay | Admitting: Otolaryngology

## 2017-10-15 DIAGNOSIS — J324 Chronic pansinusitis: Secondary | ICD-10-CM

## 2017-10-15 DIAGNOSIS — J301 Allergic rhinitis due to pollen: Secondary | ICD-10-CM | POA: Diagnosis not present

## 2017-10-15 DIAGNOSIS — J4520 Mild intermittent asthma, uncomplicated: Secondary | ICD-10-CM | POA: Diagnosis not present

## 2017-10-15 DIAGNOSIS — K219 Gastro-esophageal reflux disease without esophagitis: Secondary | ICD-10-CM | POA: Diagnosis not present

## 2017-10-15 DIAGNOSIS — H6980 Other specified disorders of Eustachian tube, unspecified ear: Secondary | ICD-10-CM | POA: Diagnosis not present

## 2017-10-15 DIAGNOSIS — R51 Headache: Secondary | ICD-10-CM | POA: Diagnosis not present

## 2017-10-18 ENCOUNTER — Ambulatory Visit
Admission: RE | Admit: 2017-10-18 | Discharge: 2017-10-18 | Disposition: A | Discharge status: Home / Self Care | Payer: 59 | Source: Ambulatory Visit | Attending: Otolaryngology | Admitting: Otolaryngology

## 2017-10-18 DIAGNOSIS — J324 Chronic pansinusitis: Secondary | ICD-10-CM | POA: Diagnosis not present

## 2017-10-18 DIAGNOSIS — J329 Chronic sinusitis, unspecified: Secondary | ICD-10-CM | POA: Diagnosis not present

## 2017-10-20 DIAGNOSIS — M542 Cervicalgia: Secondary | ICD-10-CM | POA: Diagnosis not present

## 2017-10-20 DIAGNOSIS — M791 Myalgia, unspecified site: Secondary | ICD-10-CM | POA: Diagnosis not present

## 2017-10-20 DIAGNOSIS — G43019 Migraine without aura, intractable, without status migrainosus: Secondary | ICD-10-CM | POA: Diagnosis not present

## 2017-10-20 DIAGNOSIS — R51 Headache: Secondary | ICD-10-CM | POA: Diagnosis not present

## 2017-10-20 DIAGNOSIS — G43719 Chronic migraine without aura, intractable, without status migrainosus: Secondary | ICD-10-CM | POA: Diagnosis not present

## 2017-11-18 DIAGNOSIS — G501 Atypical facial pain: Secondary | ICD-10-CM | POA: Diagnosis not present

## 2017-11-18 DIAGNOSIS — E663 Overweight: Secondary | ICD-10-CM | POA: Diagnosis not present

## 2017-11-25 DIAGNOSIS — R51 Headache: Secondary | ICD-10-CM | POA: Diagnosis not present

## 2017-11-25 DIAGNOSIS — M542 Cervicalgia: Secondary | ICD-10-CM | POA: Diagnosis not present

## 2017-11-25 DIAGNOSIS — G43019 Migraine without aura, intractable, without status migrainosus: Secondary | ICD-10-CM | POA: Diagnosis not present

## 2017-11-25 DIAGNOSIS — G43719 Chronic migraine without aura, intractable, without status migrainosus: Secondary | ICD-10-CM | POA: Diagnosis not present

## 2017-11-25 DIAGNOSIS — M791 Myalgia, unspecified site: Secondary | ICD-10-CM | POA: Diagnosis not present

## 2017-11-29 DIAGNOSIS — J310 Chronic rhinitis: Secondary | ICD-10-CM | POA: Diagnosis not present

## 2017-11-29 DIAGNOSIS — G44201 Tension-type headache, unspecified, intractable: Secondary | ICD-10-CM | POA: Diagnosis not present

## 2017-12-02 ENCOUNTER — Ambulatory Visit: Payer: Self-pay | Admitting: Family Medicine

## 2017-12-02 VITALS — BP 118/60 | HR 89 | Temp 98.1°F | Wt 166.0 lb

## 2017-12-02 DIAGNOSIS — R0982 Postnasal drip: Secondary | ICD-10-CM

## 2017-12-02 DIAGNOSIS — J309 Allergic rhinitis, unspecified: Secondary | ICD-10-CM

## 2017-12-02 MED ORDER — IPRATROPIUM BROMIDE 0.03 % NA SOLN: 2.0000 | Freq: Two times a day (BID) | NASAL | 0 refills | Status: DC

## 2017-12-02 NOTE — Patient Instructions (Signed)
I have sent over a prescription for Atrovent Nasal Spray to help with post nasal drip symptoms.     Postnasal Drip Postnasal drip is the feeling of mucus going down the back of your throat. Mucus is a slimy substance that moistens and cleans your nose and throat, as well as the air pockets in face bones near your forehead and cheeks (sinuses). Small amounts of mucus pass from your nose and sinuses down the back of your throat all the time. This is normal. When you produce too much mucus or the mucus gets too thick, you can feel it. Some common causes of postnasal drip include:  Having more mucus because of: ? A cold or the flu. ? Allergies. ? Cold air. ? Certain medicines.  Having more mucus that is thicker because of: ? A sinus or nasal infection. ? Dry air. ? A food allergy.  Follow these instructions at home: Relieving discomfort  Gargle with a salt-water mixture 3-4 times a day or as needed. To make a salt-water mixture, completely dissolve -1 tsp of salt in 1 cup of warm water.  If the air in your home is dry, use a humidifier to add moisture to the air.  Use a saline spray or container (neti pot) to flush out the nose (nasal irrigation). These methods can help clear away mucus and keep the nasal passages moist. General instructions  Take over-the-counter and prescription medicines only as told by your health care provider.  Follow instructions from your health care provider about eating or drinking restrictions. You may need to avoid caffeine.  Avoid things that you know you are allergic to (allergens), like dust, mold, pollen, pets, or certain foods.  Drink enough fluid to keep your urine pale yellow.  Keep all follow-up visits as told by your health care provider. This is important. Contact a health care provider if:  You have a fever.  You have a sore throat.  You have difficulty swallowing.  You have headache.  You have sinus pain.  You have a cough that  does not go away.  The mucus from your nose becomes thick and is green or yellow in color.  You have cold or flu symptoms that last more than 10 days. Summary  Postnasal drip is the feeling of mucus going down the back of your throat.  If your health care provider approves, use nasal irrigation or a nasal spray 2?4 times a day.  Avoid things that you know you are allergic to (allergens), like dust, mold, pollen, pets, or certain foods. This information is not intended to replace advice given to you by your health care provider. Make sure you discuss any questions you have with your health care provider. Document Released: 01/25/2017 Document Revised: 01/25/2017 Document Reviewed: 01/25/2017 Elsevier Interactive Patient Education  [employer].

## 2017-12-02 NOTE — Progress Notes (Signed)
Subjective:  Rachel Fry is a 62 y.o. female who presents for evaluation of concern for illness..  Symptoms include post nasal drip and mild nasal drainage .  Onset of symptoms was 3 days ago, and has been controlled since that time.  Treatment to date:  antihistamines.  High risk factors for influenza complications:  none.  The following portions of the patient's history were reviewed and updated as appropriate:  allergies, current medications and past medical history.  Pertinent items are noted in HPI. Objective:  BP 118/60   Pulse 89   Temp 98.1 F (36.7 C)   Wt 166 lb (75.3 kg)   SpO2 96%   BMI 27.62 kg/m  Physical Exam: Constitutional: Patient appears well-developed and well-nourished. No distress. HENT: Normocephalic, atraumatic, External right and left ear normal. Oropharynx is clear and moist.  Eyes: Conjunctivae and EOM are normal. PERRLA, no scleral icterus. Neck: Normal ROM. Neck supple. No JVD. No tracheal deviation. No thyromegaly. CVS: RRR, S1/S2 +, no murmurs, no gallops, no carotid bruit.  Pulmonary: Effort and breath sounds normal, no stridor, rhonchi, wheezes, rales.  Lymphadenopathy: No lymphadenopathy noted-cervical. Neuro: Alert and Oriented. Assessment:  Post-Nasal Drip    Plan:  Recommended Atrovent nasal spray 1 spray per nare twice daily as needed for ingestion and PND.  If symptoms worsen patient advised to follow-up in office.  Carroll Sage. Kenton Kingfisher, MSN, [place], [place] [address] [phone]

## 2017-12-03 DIAGNOSIS — L821 Other seborrheic keratosis: Secondary | ICD-10-CM | POA: Diagnosis not present

## 2017-12-03 DIAGNOSIS — D2371 Other benign neoplasm of skin of right lower limb, including hip: Secondary | ICD-10-CM | POA: Diagnosis not present

## 2017-12-03 DIAGNOSIS — D2372 Other benign neoplasm of skin of left lower limb, including hip: Secondary | ICD-10-CM | POA: Diagnosis not present

## 2017-12-03 DIAGNOSIS — L720 Epidermal cyst: Secondary | ICD-10-CM | POA: Diagnosis not present

## 2017-12-09 ENCOUNTER — Ambulatory Visit (INDEPENDENT_AMBULATORY_CARE_PROVIDER_SITE_OTHER): Payer: 59 | Admitting: Neurology

## 2017-12-09 ENCOUNTER — Encounter: Payer: Self-pay | Admitting: Neurology

## 2017-12-09 VITALS — BP 116/72 | HR 87 | Ht 66.0 in | Wt 168.5 lb

## 2017-12-09 DIAGNOSIS — R42 Dizziness and giddiness: Secondary | ICD-10-CM | POA: Diagnosis not present

## 2017-12-09 DIAGNOSIS — H9191 Unspecified hearing loss, right ear: Secondary | ICD-10-CM | POA: Diagnosis not present

## 2017-12-09 DIAGNOSIS — H9201 Otalgia, right ear: Secondary | ICD-10-CM

## 2017-12-09 NOTE — Progress Notes (Signed)
[facility]    Provider:  Dr [name] Referring Provider: [name], MD Primary Care Physician:  [name], MD  CC:  Hearing loss, vertigo, new problem and consult  Interval history 12/09/2017: She returns for a new problem and consult for hearing loss and vertigo of the right ear. She had a trip to [place] and [place] and while in [place] she got allergies, an ear infection on the right, she kept having lots of pressure and ear pain, had antibiotics and prednisone for 6 weeks and she still has pain. She feels like she has something heavy sitting on the inside of the nose, pressure and pain on the right side of the face and pain in the ear. Was daily now episodic, she saw an ENT and she saw a dentist.  Not burning, tingling, shooting. Just pain and heaviness on the right lower face from the eye to the chin. When she leans forward.    CT Maxillofacial (personally reviewed images and agree with the following) FINDINGS: Paranasal sinuses:  Frontal: Normally aerated. Patent frontal sinus drainage pathways.  Ethmoid: Normally aerated.  Maxillary: Normally aerated.  Sphenoid: Normally aerated. Patent sphenoethmoidal recesses.  Right ostiomeatal unit: Patent.  Left ostiomeatal unit: Patent.  Nasal passages: Patent. Right middle turbinate concha bullosa. Slight rightward deviation of the mid nasal septum.  Anatomy: No pneumatization superior to anterior ethmoid notches. Intact olfactory grooves and fovea ethmoidalis, Keros II bilaterally. Sellar sphenoid pneumatization pattern. No dehiscence of carotid or optic canals. No onodi cell.  Other: Clear mastoid air cells and tympanic cavities bilaterally. Unremarkable orbits and included portion of the brain.  IMPRESSION: Clear sinuses.  HPI:  Rachel Fry is a 62 y.o. female here as a referral from Dr. Karlton Lemon for migraines. Past medical history of high cholesterol, migraine, fibromyalgia,  pre-diabetes. In 1993 she had a right shoulder injury with chronic pain that radiates to the head, triggers migraines left-sided migraines. She has done trigger, dry needling, She has pain "head to toe" if you just touch her lightly it hurts everywhere, like you touch a bruise, literally everywhere, headache is always there too. She has balance problems. She has right ear diagnosed vestibular problem.  On the left side, she feels tingling, electric, pulsating, like a TENs unit on her left side.  She has "zinging" on the left side of her body. This is new. She is on a prednisone taper right now. Weakness on the left side as well. Acute onset and progressively worsening. She also has a heaviness in her left eye and vision is blurry. Her headaches are on the left side. Feels like an ache on her left side of her face, continuous and daily. Light and sound sensitivity has never been much of an issue with her daily headaches. She has trigger injections with Dr. Domingo Cocking. She is off balance. She got out of bed one morning and couldn't walk straight thought it was her migraines, saw ENT. She has daily headaches. Dan Europe was tried before the vestibular migraine started so unsure if it caused it. The last 6 weeks the dizziness is worse. Feels like she is on a boat. She is having episodes of spinning. Her let leg is heavy. She hs a long history of migraines and records from headache wellness are extensive, need to review. Here with her husband who also provides information. Reports no other inciting events, no head trauma, no falls. The right side of her body is fine since his everything is on the left  side.  Reviewed notes, labs and imaging from outside physicians, which showed:   MRI of the brain and MRA of the head images were personally reviewed and also reviewed with patient and husband, agree with the following:  MRI HEAD FINDINGS  No acute infarct, hemorrhage, or mass lesion is present. The ventricles are of  normal size. No significant extraaxial fluid collection is present.  Minimal periventricular and subcortical white matter changes bilaterally are likely within normal limits for age. No other significant white matter disease is present.  The internal auditory canals are within normal limits bilaterally. Flow is present in the major intracranial arteries. The globes and orbits are intact. The paranasal sinuses and the mastoid air cells are clear.  The skullbase is within normal limits. Midline sagittal images are unremarkable.  MRA HEAD FINDINGS  The internal carotid arteries are within normal limits from the high cervical segments through the ICA termini bilaterally. The A1 and M1 segments are normal. The anterior communicating artery is patent. The MCA bifurcations are intact. ACA and MCA branch vessels are within normal limits.  The right vertebral artery is the dominant vessel. The right PICA origin is visualized and normal. The left AICA is dominant. The basilar artery is within normal limits. Both posterior cerebral arteries originate from the basilar tip. The PCA branch vessels are within normal limits.  IMPRESSION: 1. Normal MRI the brain for age. 2. Negative MRA circle of Willis.   B12 578, sedimentation rate within normal limits, TSH within normal limits, CBC with elevated white blood cells likely due to oral steroids, CMP normal, last hemoglobin A1c in July 2016 abnormal 6.0  Abnormal  Review of Systems: Patient complains of symptoms per HPI as well as the following symptoms: Blurred vision, spinning sensation, headache, numbness, weakness. Pertinent negatives per HPI. All others negative.   Social History   Socioeconomic History  . Marital status: Married    Spouse name: [name]  . Number of children: 3  . Years of education: 18  . Highest education level: Not on file  Social Needs  . Financial resource strain: Not on file  . Food insecurity - worry: Not on file    . Food insecurity - inability: Not on file  . Transportation needs - medical: Not on file  . Transportation needs - non-medical: Not on file  Occupational History  . Occupation: Cardiab rehab  Tobacco Use  . Smoking status: Never Smoker  . Smokeless tobacco: Never Used  Substance and Sexual Activity  . Alcohol use: No  . Drug use: No  . Sexual activity: Not on file  Other Topics Concern  . Not on file  Social History Narrative   Lives with husband   Caffeine use: none   Left handed    Family History  Problem Relation Age of Onset  . Hypertension Mother   . Breast cancer Mother 33  . Thyroid disease Mother   . Stroke Mother   . Dementia Mother   . Cardiomyopathy Father   . Peripheral Artery Disease Father   . Hyperlipidemia Brother   . Hypertension Brother   . Hypertension Brother   . Hyperlipidemia Brother   . Thyroid disease Brother   . Hypertension Brother   . Birth defects Brother   . Congenital heart disease Brother   . Cancer Brother        Kidney cancer  . Colon cancer Maternal Grandmother   . Stroke Paternal Grandfather   . Neuropathy Neg Hx   .  Multiple sclerosis Neg Hx   . Migraines Neg Hx     Past Medical History:  Diagnosis Date  . Allergy   . Migraines   . Numbness and tingling    left side of body, occasionally right side   . S/P Botox injection     Past Surgical History:  Procedure Laterality Date  . ARM NEUROPLASTY Right 1994 and 1996   for chronic pain  . CESAREAN SECTION    . OVARIAN CYST REMOVAL  1973  . TONSILLECTOMY AND ADENOIDECTOMY    . TUBAL LIGATION  1988    Current Outpatient Medications  Medication Sig Dispense Refill  . albuterol (VENTOLIN HFA) 108 (90 Base) MCG/ACT inhaler 1 inhalation as needed    . atorvastatin (LIPITOR) 20 MG tablet TAKE 1 TABLET (20 MG TOTAL) BY MOUTH DAILY. 90 tablet 3  . botulinum toxin Type A (BOTOX) 100 UNITS SOLR injection Botox - Historical Medication  once every 3 months for migraines Active     . cetirizine (ZYRTEC) 10 MG tablet Take by mouth.    . Cholecalciferol (VITAMIN D3 PO) Take 1 tablet by mouth daily.    Marland Kitchen EPINEPHrine 0.3 mg/0.3 mL IJ SOAJ injection 0.3 mg once.   1  . fluticasone (FLOVENT HFA) 110 MCG/ACT inhaler Inhale 2 puffs into the lungs daily.     . frovatriptan (FROVA) 2.5 MG tablet Take 2.5 mg by mouth as needed.     . lansoprazole (PREVACID) 30 MG capsule Take 30 mg by mouth daily.  12  . MAGNESIUM PO Take 2 tablets by mouth daily.    . Multiple Vitamins-Minerals (ANTIOXIDANT PO) Take 1 tablet by mouth daily. Flame Rx    . NON FORMULARY Take by mouth 1 day or 1 dose.    . THYROID PO Take by mouth 1 day or 1 dose. supplement    . TRIAMCINOLONE ACETONIDE, NASAL STEROIDS, Place 2 sprays into the nose at bedtime.     Marland Kitchen UNABLE TO FIND Take by mouth 1 day or 1 dose. ultraflora balance    . zafirlukast (ACCOLATE) 10 MG tablet Take 10 mg by mouth 2 (two) times daily.     Marland Kitchen ipratropium (ATROVENT) 0.03 % nasal spray Place 2 sprays into both nostrils 2 (two) times daily. (Patient not taking: Reported on 12/09/2017) 30 mL 0   No current facility-administered medications for this visit.     Allergies as of 12/09/2017 - Review Complete 12/09/2017  Allergen Reaction Noted  . Flonase  [fluticasone propionate] Other (See Comments)   . Latex Itching   . Codeine  02/25/2015  . Fish oil Itching 02/25/2015  . Sulfa antibiotics  02/25/2015    Vitals: BP 116/72 (BP Location: Left Arm, Patient Position: Sitting)   Pulse 87   Ht 5\' 6"  (1.676 m)   Wt 168 lb 8 oz (76.4 kg)   BMI 27.20 kg/m   Last Weight:  Wt Readings from Last 1 Encounters:  12/09/17 168 lb 8 oz (76.4 kg)   Last Height:   Ht Readings from Last 1 Encounters:  12/09/17 5\' 6"  (1.676 m)   Physical exam: Exam: Gen: NAD, conversant, well nourised, obese, well groomed                     CV: RRR, no MRG. No Carotid Bruits. No peripheral edema, warm, nontender Eyes: Conjunctivae clear without exudates or  hemorrhage  Neuro: Detailed Neurologic Exam  Speech:    Speech is normal; fluent and spontaneous  with normal comprehension.  Cognition:    The patient is oriented to person, place, and time;     recent and remote memory intact;     language fluent;     normal attention, concentration,     fund of knowledge Cranial Nerves:    The pupils are equal, round, and reactive to light. The fundi are normal and spontaneous venous pulsations are present. Visual fields are full to finger confrontation. Extraocular movements are intact. Trigeminal sensation is intact and the muscles of mastication are normal. The face is symmetric. The palate elevates in the midline. Hearing impaired on the right.. Voice is normal. Shoulder shrug is normal. The tongue has normal motion without fasciculations.   Coordination:    Normal finger to nose and heel to shin. Normal rapid alternating movements.   Gait:    Difficulty with Heel-toe and tandem gait.   Motor Observation:    No asymmetry, no atrophy, and no involuntary movements noted. Tone:    Normal muscle tone.    Posture:    Posture is normal. normal erect    Strength: Bilat deltoid weakness 4/5, left triceps 3+/5, left hip and leg flexion 4+/5, left dorsiflexion 4+/5    Otherwise Strength is V/V in the upper and lower limbs.      Sensation: LT hemisensory loss.     Reflex Exam:  DTR's:     Deep tendon reflexes in the upper and lower extremities are normal bilaterally.   Toes:    The toes are downgoing bilaterally.   Clonus:    Clonus is absent.     Assessment/Plan:  62 year old female with a significant history of migraines, dizziness and vestibular symptoms who presents with a new problem of several months of right hearing loss, right ear pain, vertigo, she is been to multiple doctors without etiology.  Feel MRI of the brain with and without contrast is clearly indicated to evaluate for schwannomas or other intracranial etiologies.   MRI of  the brain w/wo contrast with thin cuts through the Internal Auditory Canals (see above) Lab today May consider right myringotomy possibly   Orders Placed This Encounter  Procedures  . MR BRAIN W WO CONTRAST  . Basic Metabolic Panel    Cc:Dr. Karlton Lemon  Last appointment: acute onset of left sided weakness, sensory symptoms. Exam significant for left-sided weakness and hemisensory loss. 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    (Addendum: Jill Poling M.D. Follow-up on dizziness. Audiology interpretation and vestibular tests were performed. Still having daily dizziness. Severity varies. Some nausea with episodes no vomiting. Doing vestibular exercises at home. The ENG showed no cord weakness and no positional vertigo. There was small, post head shake nystagmus, which is nonspecific, but could point to central causes. She does have a history of migraines, an MRI and MRA were otherwise unremarkable. Periodic tingling left-sided body. Audiogram was unremarkable. Nothing on EMG or audiogram to explain her dizziness in terms of a primary vestibulopathy. Migraine-related dizziness might be considered and manage further. Continue home vestibular exercises.)  Discussed again: To prevent or relieve headaches, try the following: Cool Compress. Lie down and place a cool compress on your head.  Avoid headache triggers. If certain foods or odors seem to have triggered your migraines in the past, avoid them. A headache diary might help you identify triggers.  Include physical activity in your daily routine. Try a daily walk or other  moderate aerobic exercise.  Manage stress. Find healthy ways to cope with the stressors, such as delegating tasks on your to-do list.  Practice relaxation techniques. Try deep breathing, yoga, massage and visualization.  Eat regularly. Eating regularly scheduled meals and maintaining a healthy  diet might help prevent headaches. Also, drink plenty of fluids.  Follow a regular sleep schedule. Sleep deprivation might contribute to headaches Consider biofeedback. With this mind-body technique, you learn to control certain bodily functions - such as muscle tension, heart rate and blood pressure - to prevent headaches or reduce headache pain.    Proceed to emergency room if you experience new or worsening symptoms or symptoms do not resolve, if you have new neurologic symptoms or if headache is severe, or for any concerning symptom.   Reviewed extensive notes from the [facility]: She describes severe head pain. Located a retro-orbital left temporal duration of the headache is all day. Pain is described as throbbing, sharp and stabbing. Nausea is present, photophobia is present, vomiting is absent, phonophobia is absent, osmophobia is absent and dizziness is absent. Autonomic symptoms are absent. Other symptoms include tingling sensations. Prodrome is absent. No aura is present. Headache triggers include emotional factors, stress and exposure to environmental hazards.  Past headache related medications: Analgesics: Aspirin, codeine, propoxyphene and Tylenol antimigraine: Cambio, Frova, Imitrex tablet, Migranal and Imitrex nasal powder   decongestants and antihistamines: Allegra, Benadryl, Claritin, Dramamine, and time, Flonase, Nasonex, pseudoephedrine Zyrtec Antinausea: Promethazine NSAIDs: Aleve, Celebrex, ibuprofen, indomethacin and Naprosyn Muscle relaxants: Baclofen, Flexeril, Robaxin and Skelaxin Anticonvulsants: Depakote, gabapentin, Lyrica and topiramate Steroids: Prednisone   sleeping pills and tranquilizers: Alprazolam and perphenazine Antidepressants: Cymbalta and Lexapro herbal, hormonal: None Procedure therapies: Botox, nerve blocks, and trigger point injections. 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 Neurologic exam remains normal.     Sarina Ill, MD  [facility] [address] [place],  [address]  Phone [phone] Fax [phone]

## 2017-12-09 NOTE — Patient Instructions (Addendum)
MRI of the brain w/wo contrast with thin cuts through the Internal Auditory Canals Lab today May consider right myringotomy possibly

## 2017-12-10 ENCOUNTER — Telehealth: Payer: Self-pay | Admitting: *Deleted

## 2017-12-10 LAB — BASIC METABOLIC PANEL
BUN/Creatinine Ratio: 21 (ref 12–28)
BUN: 15 mg/dL (ref 8–27)
CALCIUM: 10.4 mg/dL — AB (ref 8.7–10.3)
CHLORIDE: 106 mmol/L (ref 96–106)
CO2: 23 mmol/L (ref 20–29)
Creatinine, Ser: 0.73 mg/dL (ref 0.57–1.00)
GFR calc Af Amer: 102 mL/min/{1.73_m2} (ref >59)
GFR, EST NON AFRICAN AMERICAN: 89 mL/min/{1.73_m2} (ref >59)
GLUCOSE: 101 mg/dL — AB (ref 65–99)
POTASSIUM: 4.1 mmol/L (ref 3.5–5.2)
SODIUM: 146 mmol/L — AB (ref 134–144)

## 2017-12-10 NOTE — Telephone Encounter (Signed)
LVM informing patient her labs are unremarkable. Advised the office is closed, opens Mon at 8 am; left number.

## 2017-12-16 ENCOUNTER — Ambulatory Visit
Admission: RE | Admit: 2017-12-16 | Discharge: 2017-12-16 | Disposition: A | Discharge status: Home / Self Care | Payer: 59 | Source: Ambulatory Visit | Attending: Neurology | Admitting: Neurology

## 2017-12-16 ENCOUNTER — Other Ambulatory Visit: Payer: Self-pay | Admitting: Neurology

## 2017-12-16 DIAGNOSIS — R42 Dizziness and giddiness: Secondary | ICD-10-CM | POA: Diagnosis not present

## 2017-12-16 DIAGNOSIS — H9201 Otalgia, right ear: Secondary | ICD-10-CM | POA: Diagnosis not present

## 2017-12-16 DIAGNOSIS — Q283 Other malformations of cerebral vessels: Secondary | ICD-10-CM | POA: Diagnosis not present

## 2017-12-16 DIAGNOSIS — H9191 Unspecified hearing loss, right ear: Secondary | ICD-10-CM

## 2017-12-16 DIAGNOSIS — H9041 Sensorineural hearing loss, unilateral, right ear, with unrestricted hearing on the contralateral side: Secondary | ICD-10-CM | POA: Diagnosis not present

## 2017-12-16 MED ORDER — GADOBENATE DIMEGLUMINE 529 MG/ML IV SOLN
15.0000 mL | Freq: Once | INTRAVENOUS | Status: AC | PRN
  Administered 2017-12-16: 15 mL via INTRAVENOUS

## 2017-12-17 ENCOUNTER — Telehealth: Payer: Self-pay | Admitting: Neurology

## 2017-12-17 NOTE — Telephone Encounter (Signed)
Called the patient made her aware the MRI was normal. Pt verbalized understanding.

## 2017-12-17 NOTE — Telephone Encounter (Signed)
-----   Message from Melvenia Beam, MD sent at 12/17/2017  7:51 AM EST ----- MRI brain has not changed, no cause for her symptoms.

## 2017-12-23 DIAGNOSIS — G43019 Migraine without aura, intractable, without status migrainosus: Secondary | ICD-10-CM | POA: Diagnosis not present

## 2017-12-23 DIAGNOSIS — M791 Myalgia, unspecified site: Secondary | ICD-10-CM | POA: Diagnosis not present

## 2017-12-23 DIAGNOSIS — G43719 Chronic migraine without aura, intractable, without status migrainosus: Secondary | ICD-10-CM | POA: Diagnosis not present

## 2017-12-23 DIAGNOSIS — G518 Other disorders of facial nerve: Secondary | ICD-10-CM | POA: Diagnosis not present

## 2017-12-23 DIAGNOSIS — M542 Cervicalgia: Secondary | ICD-10-CM | POA: Diagnosis not present

## 2018-01-03 DIAGNOSIS — R202 Paresthesia of skin: Secondary | ICD-10-CM | POA: Diagnosis not present

## 2018-01-03 DIAGNOSIS — E559 Vitamin D deficiency, unspecified: Secondary | ICD-10-CM | POA: Diagnosis not present

## 2018-01-03 DIAGNOSIS — E663 Overweight: Secondary | ICD-10-CM | POA: Diagnosis not present

## 2018-01-17 DIAGNOSIS — H6980 Other specified disorders of Eustachian tube, unspecified ear: Secondary | ICD-10-CM | POA: Diagnosis not present

## 2018-01-17 DIAGNOSIS — H93299 Other abnormal auditory perceptions, unspecified ear: Secondary | ICD-10-CM | POA: Diagnosis not present

## 2018-01-19 DIAGNOSIS — R51 Headache: Secondary | ICD-10-CM | POA: Diagnosis not present

## 2018-01-19 DIAGNOSIS — G43719 Chronic migraine without aura, intractable, without status migrainosus: Secondary | ICD-10-CM | POA: Diagnosis not present

## 2018-01-19 DIAGNOSIS — M791 Myalgia, unspecified site: Secondary | ICD-10-CM | POA: Diagnosis not present

## 2018-01-19 DIAGNOSIS — G43019 Migraine without aura, intractable, without status migrainosus: Secondary | ICD-10-CM | POA: Diagnosis not present

## 2018-01-19 DIAGNOSIS — M542 Cervicalgia: Secondary | ICD-10-CM | POA: Diagnosis not present

## 2018-02-09 DIAGNOSIS — G43909 Migraine, unspecified, not intractable, without status migrainosus: Secondary | ICD-10-CM | POA: Diagnosis not present

## 2018-02-09 DIAGNOSIS — G501 Atypical facial pain: Secondary | ICD-10-CM | POA: Diagnosis not present

## 2018-02-16 DIAGNOSIS — M791 Myalgia, unspecified site: Secondary | ICD-10-CM | POA: Diagnosis not present

## 2018-02-16 DIAGNOSIS — R51 Headache: Secondary | ICD-10-CM | POA: Diagnosis not present

## 2018-02-16 DIAGNOSIS — M542 Cervicalgia: Secondary | ICD-10-CM | POA: Diagnosis not present

## 2018-02-16 DIAGNOSIS — G43019 Migraine without aura, intractable, without status migrainosus: Secondary | ICD-10-CM | POA: Diagnosis not present

## 2018-02-16 DIAGNOSIS — G43719 Chronic migraine without aura, intractable, without status migrainosus: Secondary | ICD-10-CM | POA: Diagnosis not present

## 2018-03-03 DIAGNOSIS — Z0000 Encounter for general adult medical examination without abnormal findings: Secondary | ICD-10-CM | POA: Diagnosis not present

## 2018-03-03 DIAGNOSIS — E559 Vitamin D deficiency, unspecified: Secondary | ICD-10-CM | POA: Diagnosis not present

## 2018-03-03 DIAGNOSIS — E785 Hyperlipidemia, unspecified: Secondary | ICD-10-CM | POA: Diagnosis not present

## 2018-03-03 DIAGNOSIS — R7309 Other abnormal glucose: Secondary | ICD-10-CM | POA: Diagnosis not present

## 2018-03-24 DIAGNOSIS — M791 Myalgia, unspecified site: Secondary | ICD-10-CM | POA: Diagnosis not present

## 2018-03-24 DIAGNOSIS — G518 Other disorders of facial nerve: Secondary | ICD-10-CM | POA: Diagnosis not present

## 2018-03-24 DIAGNOSIS — M542 Cervicalgia: Secondary | ICD-10-CM | POA: Diagnosis not present

## 2018-03-24 DIAGNOSIS — G43019 Migraine without aura, intractable, without status migrainosus: Secondary | ICD-10-CM | POA: Diagnosis not present

## 2018-03-24 DIAGNOSIS — G43719 Chronic migraine without aura, intractable, without status migrainosus: Secondary | ICD-10-CM | POA: Diagnosis not present

## 2018-04-26 DIAGNOSIS — G43019 Migraine without aura, intractable, without status migrainosus: Secondary | ICD-10-CM | POA: Diagnosis not present

## 2018-04-26 DIAGNOSIS — M542 Cervicalgia: Secondary | ICD-10-CM | POA: Diagnosis not present

## 2018-04-26 DIAGNOSIS — R51 Headache: Secondary | ICD-10-CM | POA: Diagnosis not present

## 2018-04-26 DIAGNOSIS — M791 Myalgia, unspecified site: Secondary | ICD-10-CM | POA: Diagnosis not present

## 2018-04-26 DIAGNOSIS — G43719 Chronic migraine without aura, intractable, without status migrainosus: Secondary | ICD-10-CM | POA: Diagnosis not present

## 2018-05-19 DIAGNOSIS — E039 Hypothyroidism, unspecified: Secondary | ICD-10-CM | POA: Diagnosis not present

## 2018-05-19 DIAGNOSIS — G501 Atypical facial pain: Secondary | ICD-10-CM | POA: Diagnosis not present

## 2018-05-19 DIAGNOSIS — E559 Vitamin D deficiency, unspecified: Secondary | ICD-10-CM | POA: Diagnosis not present

## 2018-06-02 DIAGNOSIS — M791 Myalgia, unspecified site: Secondary | ICD-10-CM | POA: Diagnosis not present

## 2018-06-02 DIAGNOSIS — G43019 Migraine without aura, intractable, without status migrainosus: Secondary | ICD-10-CM | POA: Diagnosis not present

## 2018-06-02 DIAGNOSIS — R51 Headache: Secondary | ICD-10-CM | POA: Diagnosis not present

## 2018-06-02 DIAGNOSIS — M542 Cervicalgia: Secondary | ICD-10-CM | POA: Diagnosis not present

## 2018-06-02 DIAGNOSIS — G43719 Chronic migraine without aura, intractable, without status migrainosus: Secondary | ICD-10-CM | POA: Diagnosis not present

## 2018-06-23 DIAGNOSIS — M542 Cervicalgia: Secondary | ICD-10-CM | POA: Diagnosis not present

## 2018-06-23 DIAGNOSIS — G518 Other disorders of facial nerve: Secondary | ICD-10-CM | POA: Diagnosis not present

## 2018-06-23 DIAGNOSIS — G43719 Chronic migraine without aura, intractable, without status migrainosus: Secondary | ICD-10-CM | POA: Diagnosis not present

## 2018-06-23 DIAGNOSIS — M791 Myalgia, unspecified site: Secondary | ICD-10-CM | POA: Diagnosis not present

## 2018-06-23 DIAGNOSIS — G43019 Migraine without aura, intractable, without status migrainosus: Secondary | ICD-10-CM | POA: Diagnosis not present

## 2018-06-30 DIAGNOSIS — M797 Fibromyalgia: Secondary | ICD-10-CM | POA: Diagnosis not present

## 2018-06-30 DIAGNOSIS — G501 Atypical facial pain: Secondary | ICD-10-CM | POA: Insufficient documentation

## 2018-06-30 DIAGNOSIS — G43909 Migraine, unspecified, not intractable, without status migrainosus: Secondary | ICD-10-CM | POA: Diagnosis not present

## 2018-07-21 ENCOUNTER — Ambulatory Visit
Admission: RE | Admit: 2018-07-21 | Discharge: 2018-07-21 | Disposition: A | Discharge status: Home / Self Care | Payer: 59 | Source: Ambulatory Visit | Attending: Internal Medicine | Admitting: Internal Medicine

## 2018-07-21 ENCOUNTER — Other Ambulatory Visit: Payer: Self-pay | Admitting: Internal Medicine

## 2018-07-21 DIAGNOSIS — M542 Cervicalgia: Secondary | ICD-10-CM | POA: Diagnosis not present

## 2018-07-21 DIAGNOSIS — R0602 Shortness of breath: Secondary | ICD-10-CM

## 2018-07-21 DIAGNOSIS — R51 Headache: Secondary | ICD-10-CM | POA: Diagnosis not present

## 2018-07-21 DIAGNOSIS — G43719 Chronic migraine without aura, intractable, without status migrainosus: Secondary | ICD-10-CM | POA: Diagnosis not present

## 2018-07-21 DIAGNOSIS — M791 Myalgia, unspecified site: Secondary | ICD-10-CM | POA: Diagnosis not present

## 2018-07-21 DIAGNOSIS — G43019 Migraine without aura, intractable, without status migrainosus: Secondary | ICD-10-CM | POA: Diagnosis not present

## 2018-08-05 DIAGNOSIS — E785 Hyperlipidemia, unspecified: Secondary | ICD-10-CM | POA: Diagnosis not present

## 2018-08-05 DIAGNOSIS — G501 Atypical facial pain: Secondary | ICD-10-CM | POA: Diagnosis not present

## 2018-08-05 DIAGNOSIS — R600 Localized edema: Secondary | ICD-10-CM | POA: Diagnosis not present

## 2018-08-18 DIAGNOSIS — M791 Myalgia, unspecified site: Secondary | ICD-10-CM | POA: Diagnosis not present

## 2018-08-18 DIAGNOSIS — G43019 Migraine without aura, intractable, without status migrainosus: Secondary | ICD-10-CM | POA: Diagnosis not present

## 2018-08-18 DIAGNOSIS — M542 Cervicalgia: Secondary | ICD-10-CM | POA: Diagnosis not present

## 2018-08-18 DIAGNOSIS — G43719 Chronic migraine without aura, intractable, without status migrainosus: Secondary | ICD-10-CM | POA: Diagnosis not present

## 2018-08-18 DIAGNOSIS — R51 Headache: Secondary | ICD-10-CM | POA: Diagnosis not present

## 2018-08-26 ENCOUNTER — Encounter: Payer: Self-pay | Admitting: Emergency Medicine

## 2018-08-26 ENCOUNTER — Emergency Department
Admission: EM | Admit: 2018-08-26 | Discharge: 2018-08-26 | Disposition: A | Discharge status: Home / Self Care | Payer: 59 | Attending: Emergency Medicine | Admitting: Emergency Medicine

## 2018-08-26 ENCOUNTER — Other Ambulatory Visit: Payer: Self-pay

## 2018-08-26 DIAGNOSIS — F419 Anxiety disorder, unspecified: Secondary | ICD-10-CM | POA: Insufficient documentation

## 2018-08-26 DIAGNOSIS — Z91040 Latex allergy status: Secondary | ICD-10-CM | POA: Diagnosis not present

## 2018-08-26 DIAGNOSIS — G43909 Migraine, unspecified, not intractable, without status migrainosus: Secondary | ICD-10-CM | POA: Diagnosis not present

## 2018-08-26 DIAGNOSIS — Z79899 Other long term (current) drug therapy: Secondary | ICD-10-CM | POA: Insufficient documentation

## 2018-08-26 DIAGNOSIS — F329 Major depressive disorder, single episode, unspecified: Secondary | ICD-10-CM | POA: Insufficient documentation

## 2018-08-26 DIAGNOSIS — R51 Headache: Secondary | ICD-10-CM | POA: Diagnosis present

## 2018-08-26 MED ORDER — KETOROLAC TROMETHAMINE 30 MG/ML IJ SOLN
30.0000 mg | Freq: Once | INTRAMUSCULAR | Status: AC
  Administered 2018-08-26: 30 mg via INTRAVENOUS
  Filled 2018-08-26: qty 1

## 2018-08-26 MED ORDER — DIPHENHYDRAMINE HCL 50 MG/ML IJ SOLN
25.0000 mg | Freq: Once | INTRAMUSCULAR | Status: AC
  Administered 2018-08-26: 25 mg via INTRAVENOUS
  Filled 2018-08-26: qty 1

## 2018-08-26 MED ORDER — SODIUM CHLORIDE 0.9 % IV BOLUS
1000.0000 mL | Freq: Once | INTRAVENOUS | Status: AC
  Administered 2018-08-26: 1000 mL via INTRAVENOUS

## 2018-08-26 MED ORDER — PROCHLORPERAZINE EDISYLATE 10 MG/2ML IJ SOLN
10.0000 mg | Freq: Once | INTRAMUSCULAR | Status: AC
  Administered 2018-08-26: 10 mg via INTRAVENOUS
  Filled 2018-08-26: qty 2

## 2018-08-26 NOTE — ED Notes (Signed)
Patient reports history of focal migraines on the left. States for the last 2 years, she has had worsening weakness and tingling to her left side. Patient states she was seen at PCP Tuesday and given a new medication to try. Denies any improvement with medication. Patient denies any new symptoms today, only that they have been worsening. Patient alert and oriented. Ambulatory with steady gait. Mild weakness noted to left side but states this has been present for 2 years.

## 2018-08-26 NOTE — ED Notes (Signed)
Patient ambulatory to restroom with steady gait and NAD noted. Reports some improvement of headache and left arm weakness. Patient repositioned in bed and attached to monitor. No other needs expressed at this time.

## 2018-08-26 NOTE — ED Triage Notes (Signed)
Pt arrives with complaints of complex migraine. Pt states she has had issues with tingling, numbness, and migraines for 2 years but states "today this is just worse." Pt a & o x 4. Pt also reports nausea.

## 2018-08-26 NOTE — ED Provider Notes (Signed)
[facility] Emergency Department Provider Note  ____________________________________________   First MD Initiated Contact with Patient 08/26/18 1158     (approximate)  I have reviewed the triage vital signs and the nursing notes.   HISTORY  Chief Complaint Migraine   HPI Rachel Fry is a 62 y.o. female with a history of complex migraines associated with left-sided numbness as well as weakness was presented to the emergency department today with 3 to 4 days of a left-sided headache which she describes as a 3-4 out of 10.  She says that it is a tightness and associated with increased heaviness to the left upper and lower extremities.  Denies vomiting.  Denies photophobia.  Says that she has tried several of her prescribed migraine medications at home without relief.  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  Tried to have an appoint with her primary care doctor but was unable to be seen until 11 and so she reported to the emergency department.  She says that she has had similar symptoms in the past which she is having today and had a CT and MRI which were negative.   Past Medical History:  Diagnosis Date  . Allergy   . Migraines   . Numbness and tingling    left side of body, occasionally right side   . S/P Botox injection     Patient Active Problem List   Diagnosis Date Noted  . Postoperative history of checked last year 04/22/2017  . Weakness of left arm 04/22/2017  . Numbness and tingling in left arm 04/22/2017  . Lower back pain 04/02/2016  . Acute stress disorder 02/25/2015  . Anxiety 02/25/2015  . Airway hyperreactivity 02/25/2015  . Bradycardia 02/25/2015  . Hypersomnia 02/25/2015  . Clinical depression 02/25/2015  . Elevated blood sugar 02/25/2015  . 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 Acid reflux 02/25/2015  . 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 H/O disease 02/25/2015  . Hypercholesteremia 02/25/2015  . Headache, migraine 02/25/2015  .  Muscle ache 02/25/2015  . Allergic rhinitis, seasonal 02/25/2015  . Avitaminosis D 02/25/2015    Past Surgical History:  Procedure Laterality Date  . ARM NEUROPLASTY Right 1994 and 1996   for chronic pain  . CESAREAN SECTION    . OVARIAN CYST REMOVAL  1973  . TONSILLECTOMY AND ADENOIDECTOMY    . TUBAL LIGATION  1988    Prior to Admission medications   Medication Sig Start Date End Date Taking? Authorizing Provider  albuterol (VENTOLIN HFA) 108 (90 Base) MCG/ACT inhaler 1 inhalation as needed 07/22/17   [provider]  atorvastatin (LIPITOR) 20 MG tablet TAKE 1 TABLET (20 MG TOTAL) BY MOUTH DAILY. 09/24/16   Birdie Sons, MD  botulinum toxin Type A (BOTOX) 100 UNITS SOLR injection Botox - Historical Medication  once every 3 months for migraines Active    [provider]  cetirizine (ZYRTEC) 10 MG tablet Take by mouth. 02/04/12   [provider]  Cholecalciferol (VITAMIN D3 PO) Take 1 tablet by mouth daily.    [provider]  EPINEPHrine 0.3 mg/0.3 mL IJ SOAJ injection 0.3 mg once.  10/15/16   [provider]  fluticasone (FLOVENT HFA) 110 MCG/ACT inhaler Inhale 2 puffs into the lungs daily.     [provider]  frovatriptan (FROVA) 2.5 MG tablet Take 2.5 mg by mouth as needed.     [provider]  ipratropium (ATROVENT) 0.03 % nasal spray Place 2 sprays into both  nostrils 2 (two) times daily. Patient not taking: Reported on 12/09/2017 12/02/17   Scot Jun, FNP  lansoprazole (PREVACID) 30 MG capsule Take 30 mg by mouth daily. 03/24/17   [provider]  MAGNESIUM PO Take 2 tablets by mouth daily.    [provider]  Multiple Vitamins-Minerals (ANTIOXIDANT PO) Take 1 tablet by mouth daily. [facility]    [provider]  NON FORMULARY Take by mouth 1 day or 1 dose.    [provider]  THYROID PO Take by mouth 1 day or 1 dose. supplement    [provider]  TRIAMCINOLONE  ACETONIDE, NASAL STEROIDS, Place 2 sprays into the nose at bedtime.  02/04/12   [provider]  UNABLE TO FIND Take by mouth 1 day or 1 dose. ultraflora balance    [provider]  zafirlukast (ACCOLATE) 10 MG tablet Take 10 mg by mouth 2 (two) times daily.  02/04/12   [provider]    Allergies Flonase  [fluticasone propionate]; Latex; Codeine; Fish oil; and Sulfa antibiotics  Family History  Problem Relation Age of Onset  . Hypertension Mother   . Breast cancer Mother 24  . Thyroid disease Mother   . Stroke Mother   . Dementia Mother   . Cardiomyopathy Father   . Peripheral Artery Disease Father   . Hyperlipidemia Brother   . Hypertension Brother   . Hypertension Brother   . Hyperlipidemia Brother   . Thyroid disease Brother   . Hypertension Brother   . Birth defects Brother   . Congenital heart disease Brother   . Cancer Brother        Kidney cancer  . Colon cancer Maternal Grandmother   . Stroke Paternal Grandfather   . Neuropathy Neg Hx   . Multiple sclerosis Neg Hx   . Migraines Neg Hx     Social History Social History   Tobacco Use  . Smoking status: Never Smoker  . Smokeless tobacco: Never Used  Substance Use Topics  . Alcohol use: No  . Drug use: No    Review of Systems  Constitutional: No fever/chills Eyes: No visual changes. ENT: No sore throat. Cardiovascular: Denies chest pain. Respiratory: Denies shortness of breath. Gastrointestinal: No abdominal pain.  No nausea, no vomiting.  No diarrhea.  No constipation. Genitourinary: Negative for dysuria. Musculoskeletal: Negative for back pain. Skin: Negative for rash. Neurological: As above   ____________________________________________   PHYSICAL EXAM:  VITAL SIGNS: ED Triage Vitals  Enc Vitals Group     BP 08/26/18 1151 (!) 181/94     Pulse Rate 08/26/18 1151 91     Resp 08/26/18 1151 18     Temp 08/26/18 1151 (!) 97.5 F (36.4 C)     Temp Source 08/26/18 1151  Oral     SpO2 08/26/18 1151 99 %     Weight 08/26/18 1200 184 lb (83.5 kg)     Height 08/26/18 1200 5\' 5"  (1.651 m)     Head Circumference --      Peak Flow --      Pain Score 08/26/18 1200 4     Pain Loc --      Pain Edu? --      Excl. in [place]? --     Constitutional: Alert and oriented. Well appearing and in no acute distress. Eyes: Conjunctivae are normal.  Head: Atraumatic. Nose: No congestion/rhinnorhea. Mouth/Throat: Mucous membranes are moist.  Neck: No stridor.   Cardiovascular: Normal rate, regular  rhythm. Grossly normal heart sounds.   Respiratory: Normal respiratory effort.  No retractions. Lungs CTAB. Gastrointestinal: Soft and nontender. No distention. Musculoskeletal: No lower extremity tenderness nor edema.  No joint effusions. Neurologic:  Normal speech and language.  5 out of 5 strength throughout.  However, the patient says that her left upper and left lower extremities feel "heavy" when she lifts them off the bed.  Very minimal decreased sensation to light touch left upper and lower extremities.  No facial asymmetry.  No tongue deviation. Skin:  Skin is warm, dry and intact. No rash noted. Psychiatric: Mood and affect are normal. Speech and behavior are normal.  ____________________________________________   LABS (all labs ordered are listed, but only abnormal results are displayed)  Labs Reviewed - No data to display ____________________________________________  EKG   ____________________________________________  RADIOLOGY   ____________________________________________   PROCEDURES  Procedure(s) performed:   Procedures  Critical Care performed:   ____________________________________________   INITIAL IMPRESSION / ASSESSMENT AND PLAN / ED COURSE  Pertinent labs & imaging results that were available during my care of the patient were reviewed by me and considered in my medical decision making (see chart for details).  Differential diagnosis  includes, but is not limited to, intracranial hemorrhage, meningitis/encephalitis, previous head trauma, cavernous venous thrombosis, tension headache, temporal arteritis, migraine or migraine equivalent, idiopathic intracranial hypertension, and non-specific headache. As part of my medical decision making, I reviewed the following data within the electronic MEDICAL RECORD NUMBER Notes from prior ED visits  Patient evaluated in 2018 when she had a CT as well as an MRI.  Both were negative for acute pathology.  Patient's symptoms were attributed to complex migraine.  She will receive migraine treatment here in the emergency department.  We will reassess.  If patient is headache free/symptom-free at that time I feel that she will likely be able to be safely discharged to home.  No history of smoking.  No history of stroke in herself or her family.  History of migraine headache was much improved in the past which could very well be representing why she is here in the emergency department today.  ----------------------------------------- 1:58 PM on 08/26/2018 -----------------------------------------  Patient at this time says that she is headache free and that her weakness/heaviness on her left side is approximately her baseline.  Grossly, she appears neurologically intact.  We discussed several possibilities to proceed including imaging or being discharged to home at this time.  The patient would like to be discharged and will return for any worsening or concerning symptoms.  We had previously discussed the possibility of stroke and she is aware that her presentation may be complex migraine related versus a stroke.  Patient says that she has also been prescribed a steroid course by her primary care doctor and she will be picking up this afternoon for the typical migraine.  Patient understanding of the diagnosis as well as treatment and willing to comply. ____________________________________________   FINAL  CLINICAL IMPRESSION(S) / ED DIAGNOSES  Complex migraine.  NEW MEDICATIONS STARTED DURING THIS VISIT:  New Prescriptions   No medications on file     Note:  This document was prepared using Dragon voice recognition software and may include unintentional dictation errors.     Orbie Pyo, MD 08/26/18 1400

## 2018-09-06 DIAGNOSIS — G43719 Chronic migraine without aura, intractable, without status migrainosus: Secondary | ICD-10-CM | POA: Diagnosis not present

## 2018-09-06 DIAGNOSIS — R51 Headache: Secondary | ICD-10-CM | POA: Diagnosis not present

## 2018-09-06 DIAGNOSIS — G43019 Migraine without aura, intractable, without status migrainosus: Secondary | ICD-10-CM | POA: Diagnosis not present

## 2018-09-06 DIAGNOSIS — M791 Myalgia, unspecified site: Secondary | ICD-10-CM | POA: Diagnosis not present

## 2018-09-06 DIAGNOSIS — M542 Cervicalgia: Secondary | ICD-10-CM | POA: Diagnosis not present

## 2018-09-20 ENCOUNTER — Encounter: Payer: Self-pay | Admitting: Physician Assistant

## 2018-09-20 ENCOUNTER — Ambulatory Visit: Payer: Self-pay | Admitting: Physician Assistant

## 2018-09-20 VITALS — BP 160/100 | HR 82 | Temp 98.1°F | Wt 188.0 lb

## 2018-09-20 DIAGNOSIS — R0982 Postnasal drip: Secondary | ICD-10-CM

## 2018-09-20 DIAGNOSIS — J029 Acute pharyngitis, unspecified: Secondary | ICD-10-CM

## 2018-09-20 MED ORDER — AZELASTINE HCL 0.1 % NA SOLN: 1.0000 | Freq: Two times a day (BID) | NASAL | 12 refills | Status: DC

## 2018-09-20 MED ORDER — DIPHENHYDRAMINE HCL 50 MG PO TABS: 50.0000 mg | ORAL_TABLET | Freq: Every evening | ORAL | 0 refills | Status: DC | PRN

## 2018-09-20 MED ORDER — LIDOCAINE VISCOUS HCL 2 % MT SOLN: 5.0000 mL | OROMUCOSAL | 0 refills | Status: DC | PRN

## 2018-09-20 NOTE — Patient Instructions (Addendum)
Thank you for choosing [facility] for your health care needs.  You have been diagnosed with post-nasal drip and sore throat.  Recommend you continue regular allergy medication: Nasacort nasal spray, Zyrtec, and Accolate.  Increase fluids; will help thin mucous. Prescribed Benadryl, to take at night.  May gargle with salt water. May drink hot tea with lemon or honey. Use new prescription Lidocaine mouth wash. Gargle and swallow for sore throat symptom relief.  Follow-up with your family physician, Leodis Binet, or urgent care if your upper respiratory infection symptoms last >10 days OR if your symptoms become severe such as sinus pain or worsening cough, wheezing, chest congestion, etc.  Hope you feel better soon.  Upper Respiratory Infection, Adult Most upper respiratory infections (URIs) are caused by a virus. A URI affects the nose, throat, and upper air passages. The most common type of URI is often called "the common cold." Follow these instructions at home:  Take medicines only as told by your doctor.  Gargle warm saltwater or take cough drops to comfort your throat as told by your doctor.  Use a warm mist humidifier or inhale steam from a shower to increase air moisture. This may make it easier to breathe.  Drink enough fluid to keep your pee (urine) clear or pale yellow.  Eat soups and other clear broths.  Have a healthy diet.  Rest as needed.  Go back to work when your fever is gone or your doctor says it is okay. ? You may need to stay home longer to avoid giving your URI to others. ? You can also wear a face mask and wash your hands often to prevent spread of the virus.  Use your inhaler more if you have asthma.  Do not use any tobacco products, including cigarettes, chewing tobacco, or electronic cigarettes. If you need help quitting, ask your doctor. Contact a doctor if:  You are getting worse, not better.  Your symptoms are not helped by medicine.  You have  chills.  You are getting more short of breath.  You have brown or red mucus.  You have yellow or brown discharge from your nose.  You have pain in your face, especially when you bend forward.  You have a fever.  You have puffy (swollen) neck glands.  You have pain while swallowing.  You have white areas in the back of your throat. Get help right away if:  You have very bad or constant: ? Headache. ? Ear pain. ? Pain in your forehead, behind your eyes, and over your cheekbones (sinus pain). ? Chest pain.  You have long-lasting (chronic) lung disease and any of the following: ? Wheezing. ? Long-lasting cough. ? Coughing up blood. ? A change in your usual mucus.  You have a stiff neck.  You have changes in your: ? Vision. ? Hearing. ? Thinking. ? Mood. This information is not intended to replace advice given to you by your health care provider. Make sure you discuss any questions you have with your health care provider. Document Released: 03/30/2008 Document Revised: 06/14/2016 Document Reviewed: 01/17/2014 Elsevier Interactive Patient Education  2018 [employer].

## 2018-09-20 NOTE — Progress Notes (Signed)
Patient ID: Rachel Fry DOB: 14-Feb-1956 AGE: 62 y.o. MRN: [id number]   PCP: [name], MD   Chief Complaint:  Chief Complaint  Patient presents with  . Save-PND/sorethroat  x1d     Subjective:    HPI:  Rachel Fry is a 62 y.o. female presents for evaluation  Chief Complaint  Patient presents with  . Save-PND/sorethroat  x61d   62 year old female presents to [facility] with 2 day history of URI/allergy symptoms. Began yesterday, mid day, with post-nasal drip. Thin nasal discharge down back of throat. Later in the day developed sore throat. Not severe. Located on roof of mouth and upper portion of throat. Irritated with swallowing. Has taken OTC Dayquil with minimal symptom relief. Patient with year round allergies; managed with Nasacort nasal spray, Zyrtec, and Accolate daily. Patient denies recent allergy trigger. Denies itchy/watery eyes, sneezing, rhinorrhea, wheezing, chest tightness. Patient does report right ear pain; however patient with chronic right ear pain (evaluated by PCP, ENT, and neurology) - no definitive diagnosis. Patient states unsure if right ear discomfort is different then her baseline. Denies fever, chills, headache, ear drainage/discharge, dizziness/lightheadedness, sinus pain, nasal congestion, cough, chest pain, SOB. Patient states two co-workers sick with similar symptoms.  Patient seen for similar symptoms (3 day history of PND and rhinorrhea) at [facility] on 12/02/2017. Prescribed Atrovent nasal spray. Patient states she had an adverse reaction to the Astelin; cannot remember what the reaction was, but knows she cannot tolerate Astelin nasal spray.  Patient managed by [facility] for complex migraines and vestibular symptoms.  A complete, at least 10 system review of symptoms was performed, pertinent positives and negatives as mentioned in HPI, otherwise negative.  The following portions of the  patient's history were reviewed and updated as appropriate: allergies, current medications and past medical history.  Patient Active Problem List   Diagnosis Date Noted  . Postoperative history of checked last year 04/22/2017  . Weakness of left arm 04/22/2017  . Numbness and tingling in left arm 04/22/2017  . Lower back pain 04/02/2016  . Acute stress disorder 02/25/2015  . Anxiety 02/25/2015  . Airway hyperreactivity 02/25/2015  . Bradycardia 02/25/2015  . Hypersomnia 02/25/2015  . Clinical depression 02/25/2015  . Elevated blood sugar 02/25/2015  . 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 Acid reflux 02/25/2015  . 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 H/O disease 02/25/2015  . Hypercholesteremia 02/25/2015  . Headache, migraine 02/25/2015  . Muscle ache 02/25/2015  . Allergic rhinitis, seasonal 02/25/2015  . Avitaminosis D 02/25/2015    Allergies  Allergen Reactions  . Flonase  [Fluticasone Propionate] Other (See Comments)  . Latex Itching  . Codeine     Rash/vomiting  . Fish Oil Itching  . Sulfa Antibiotics     abdominal pain    Current Outpatient Medications on File Prior to Visit  Medication Sig Dispense Refill  . albuterol (VENTOLIN HFA) 108 (90 Base) MCG/ACT inhaler 1 inhalation as needed    . atorvastatin (LIPITOR) 20 MG tablet TAKE 1 TABLET (20 MG TOTAL) BY MOUTH DAILY. 90 tablet 3  . botulinum toxin Type A (BOTOX) 100 UNITS SOLR injection Botox - Historical Medication  once every 3 months for migraines Active    . cetirizine (ZYRTEC) 10 MG tablet Take by mouth.    . EPINEPHrine 0.3 mg/0.3 mL IJ SOAJ injection 0.3 mg once.   1  . fluticasone (FLOVENT HFA) 110 MCG/ACT inhaler Inhale 2 puffs into the lungs daily.     Marland Kitchen  frovatriptan (FROVA) 2.5 MG tablet Take 2.5 mg by mouth as needed.     . lansoprazole (PREVACID) 30 MG capsule Take 30 mg by mouth daily.  12  . levothyroxine (SYNTHROID, LEVOTHROID) 50 MCG tablet   5  . MAGNESIUM PO Take 2 tablets by mouth daily.    . Multiple  Vitamins-Minerals (ANTIOXIDANT PO) Take 1 tablet by mouth daily. [facility]    . NON FORMULARY Take by mouth 1 day or 1 dose.    . predniSONE (DELTASONE) 5 MG tablet prednisone 5 mg tablet    . promethazine (PHENERGAN) 25 MG tablet promethazine 25 mg tablet    . THYROID PO Take by mouth 1 day or 1 dose. supplement    . TRIAMCINOLONE ACETONIDE, NASAL STEROIDS, Place 2 sprays into the nose at bedtime.     Marland Kitchen UNABLE TO FIND Take by mouth 1 day or 1 dose. ultraflora balance    . Vitamin D, Ergocalciferol, (DRISDOL) 1.25 MG (50000 UT) CAPS capsule   3  . zafirlukast (ACCOLATE) 10 MG tablet Take 10 mg by mouth 2 (two) times daily.     . furosemide (LASIX) 20 MG tablet furosemide 20 mg tablet  Take 1 tablet twice a day by oral route as needed.     No current facility-administered medications on file prior to visit.        Objective:   Vitals:   09/20/18 1402  BP: (!) 160/100  Pulse: 82  Temp: 98.1 F (36.7 C)  SpO2: 99%     Wt Readings from Last 3 Encounters:  09/20/18 188 lb (85.3 kg)  08/26/18 184 lb (83.5 kg)  12/09/17 168 lb 8 oz (76.4 kg)    Physical Exam:   General Appearance:  Alert, cooperative, appears stated age. In no acute distress. Afebrile.  Head:  Normocephalic, without obvious abnormality, atraumatic  Eyes:  PERRL, conjunctiva/corneas clear, EOM's intact  Ears:  Normal external ear canals, both ears. Right TM with possible scar tissue. No erythema, injection, or effusion. Left TM WNL.  Nose: Nares normal, septum midline. No discharge. Normal mucosa. No sinus tenderness with percussion/palpation.  Throat: Lips, mucosa, and tongue normal; teeth and gums normal. Throat reveals no erythema.   Neck: Supple, symmetrical, trachea midline, no cervical lymphadenopathy  Lungs:   Clear to auscultation bilaterally, respirations unlabored  Heart:  Regular rate and rhythm, S1 and S2 normal, no murmur, rub, or gallop  Extremities: Extremities normal, atraumatic, no cyanosis or  edema  Pulses: 2+ and symmetric  Skin: Skin color, texture, turgor normal, no rashes or lesions  Lymph nodes: Cervical, supraclavicular, and axillary nodes normal  Neurologic: Normal    Assessment & Plan:    Exam findings, diagnosis etiology and medication use and indications reviewed with patient. Follow-Up and discharge instructions provided. No emergent/urgent issues found on exam.  Patient education was provided.   Patient verbalized understanding of information provided and agrees with plan of care (POC), all questions answered. The patient is advised to call or return to clinic if condition does not see an improvement in symptoms, or to seek the care of the closest emergency department if condition worsens with the below plan.    1. Postnasal drip  - diphenhydrAMINE (BENADRYL) 50 MG tablet; Take 1 tablet (50 mg total) by mouth at bedtime as needed for up to 7 days for allergies.  Dispense: 7 tablet; Refill: 0  2. Sore throat  - lidocaine (XYLOCAINE) 2 % solution; Use as directed 5 mLs in the  mouth or throat every 4 (four) hours as needed for mouth pain. Gargle and swallow.  Dispense: 100 mL; Refill: 0   Patient with two day history of post-nasal drip and sore/irritated throat. Suspect patient with start of self-limited viral URI. Recommended continuation of her regular allergy medication (Nasacort nasal spray, Zyrtec, and Accolate). Prescribed Benadryl and Lidocaine oral rinse for PND and sore throat respectively. Patient advised to return to clinic if URI symptoms last longer than 10 days, sooner with worsening or new/concerning symptoms.  Patient's blood pressure elevated at today's examination; 160/100. Patient aware. Has been monitoring. Patient suspects due to daily prednisone; 5mg  qd. Patient advised to f/u with PCP for further evaluation and continued management. Patient advised to avoid decongestants, which can raise blood pressure, in the meantime.   Darlin Priestly, MHS,  PA-C Montey Hora, MHS, PA-C Advanced Practice Provider [facility]  [address] [facility], [place], [place] [address] (p):  [phone] [email or web address]

## 2018-09-21 DIAGNOSIS — G43719 Chronic migraine without aura, intractable, without status migrainosus: Secondary | ICD-10-CM | POA: Diagnosis not present

## 2018-09-21 DIAGNOSIS — G43019 Migraine without aura, intractable, without status migrainosus: Secondary | ICD-10-CM | POA: Diagnosis not present

## 2018-09-21 DIAGNOSIS — M791 Myalgia, unspecified site: Secondary | ICD-10-CM | POA: Diagnosis not present

## 2018-09-21 DIAGNOSIS — G518 Other disorders of facial nerve: Secondary | ICD-10-CM | POA: Diagnosis not present

## 2018-09-21 DIAGNOSIS — M542 Cervicalgia: Secondary | ICD-10-CM | POA: Diagnosis not present

## 2018-09-26 ENCOUNTER — Telehealth: Payer: Self-pay | Admitting: Emergency Medicine

## 2018-09-26 NOTE — Telephone Encounter (Signed)
Left message follow up call from [facility] visit

## 2018-09-29 DIAGNOSIS — G501 Atypical facial pain: Secondary | ICD-10-CM | POA: Diagnosis not present

## 2018-09-29 DIAGNOSIS — E039 Hypothyroidism, unspecified: Secondary | ICD-10-CM | POA: Diagnosis not present

## 2018-09-29 DIAGNOSIS — I10 Essential (primary) hypertension: Secondary | ICD-10-CM | POA: Diagnosis not present

## 2018-09-29 DIAGNOSIS — E785 Hyperlipidemia, unspecified: Secondary | ICD-10-CM | POA: Diagnosis not present

## 2018-10-05 DIAGNOSIS — H5213 Myopia, bilateral: Secondary | ICD-10-CM | POA: Diagnosis not present

## 2018-10-21 DIAGNOSIS — G43019 Migraine without aura, intractable, without status migrainosus: Secondary | ICD-10-CM | POA: Diagnosis not present

## 2018-10-21 DIAGNOSIS — R51 Headache: Secondary | ICD-10-CM | POA: Diagnosis not present

## 2018-10-21 DIAGNOSIS — G43719 Chronic migraine without aura, intractable, without status migrainosus: Secondary | ICD-10-CM | POA: Diagnosis not present

## 2018-10-21 DIAGNOSIS — M542 Cervicalgia: Secondary | ICD-10-CM | POA: Diagnosis not present

## 2018-10-21 DIAGNOSIS — M791 Myalgia, unspecified site: Secondary | ICD-10-CM | POA: Diagnosis not present

## 2018-11-16 DIAGNOSIS — M791 Myalgia, unspecified site: Secondary | ICD-10-CM | POA: Diagnosis not present

## 2018-11-16 DIAGNOSIS — M542 Cervicalgia: Secondary | ICD-10-CM | POA: Diagnosis not present

## 2018-11-16 DIAGNOSIS — G43019 Migraine without aura, intractable, without status migrainosus: Secondary | ICD-10-CM | POA: Diagnosis not present

## 2018-11-16 DIAGNOSIS — G43719 Chronic migraine without aura, intractable, without status migrainosus: Secondary | ICD-10-CM | POA: Diagnosis not present

## 2018-11-17 DIAGNOSIS — G43909 Migraine, unspecified, not intractable, without status migrainosus: Secondary | ICD-10-CM | POA: Diagnosis not present

## 2018-11-17 DIAGNOSIS — K219 Gastro-esophageal reflux disease without esophagitis: Secondary | ICD-10-CM | POA: Diagnosis not present

## 2018-11-17 DIAGNOSIS — G629 Polyneuropathy, unspecified: Secondary | ICD-10-CM | POA: Diagnosis not present

## 2018-11-17 DIAGNOSIS — E669 Obesity, unspecified: Secondary | ICD-10-CM | POA: Diagnosis not present

## 2018-11-17 DIAGNOSIS — R030 Elevated blood-pressure reading, without diagnosis of hypertension: Secondary | ICD-10-CM | POA: Diagnosis not present

## 2018-12-02 DIAGNOSIS — M25531 Pain in right wrist: Secondary | ICD-10-CM | POA: Diagnosis not present

## 2018-12-02 DIAGNOSIS — G8929 Other chronic pain: Secondary | ICD-10-CM | POA: Diagnosis not present

## 2018-12-02 DIAGNOSIS — M19131 Post-traumatic osteoarthritis, right wrist: Secondary | ICD-10-CM | POA: Diagnosis not present

## 2018-12-22 DIAGNOSIS — M791 Myalgia, unspecified site: Secondary | ICD-10-CM | POA: Diagnosis not present

## 2018-12-22 DIAGNOSIS — G43719 Chronic migraine without aura, intractable, without status migrainosus: Secondary | ICD-10-CM | POA: Diagnosis not present

## 2018-12-22 DIAGNOSIS — M542 Cervicalgia: Secondary | ICD-10-CM | POA: Diagnosis not present

## 2018-12-22 DIAGNOSIS — G43019 Migraine without aura, intractable, without status migrainosus: Secondary | ICD-10-CM | POA: Diagnosis not present

## 2018-12-22 DIAGNOSIS — G518 Other disorders of facial nerve: Secondary | ICD-10-CM | POA: Diagnosis not present

## 2018-12-30 ENCOUNTER — Ambulatory Visit (INDEPENDENT_AMBULATORY_CARE_PROVIDER_SITE_OTHER): Payer: Self-pay | Admitting: Physician Assistant

## 2018-12-30 VITALS — BP 156/110 | HR 80 | Temp 98.1°F | Resp 16 | Wt 183.0 lb

## 2018-12-30 DIAGNOSIS — J301 Allergic rhinitis due to pollen: Secondary | ICD-10-CM | POA: Diagnosis not present

## 2018-12-30 DIAGNOSIS — G43911 Migraine, unspecified, intractable, with status migrainosus: Secondary | ICD-10-CM

## 2018-12-30 DIAGNOSIS — J4520 Mild intermittent asthma, uncomplicated: Secondary | ICD-10-CM | POA: Diagnosis not present

## 2018-12-30 DIAGNOSIS — K219 Gastro-esophageal reflux disease without esophagitis: Secondary | ICD-10-CM | POA: Diagnosis not present

## 2018-12-30 MED ORDER — ONDANSETRON HCL 4 MG/5ML PO SOLN: 4.0000 mg | Freq: Once | ORAL | Status: DC

## 2018-12-30 MED ORDER — DIPHENHYDRAMINE HCL 25 MG PO CAPS
50.0000 mg | ORAL_CAPSULE | Freq: Once | ORAL | Status: AC
  Administered 2018-12-30: 50 mg via ORAL

## 2018-12-30 MED ORDER — KETOROLAC TROMETHAMINE 30 MG/ML IJ SOLN
30.0000 mg | Freq: Once | INTRAMUSCULAR | Status: AC
  Administered 2018-12-30: 30 mg via INTRAVENOUS

## 2018-12-30 NOTE — Patient Instructions (Addendum)
Thank you for choosing [facility] for your health care needs.  You have been diagnosed with a migraine headache.  You have been given a dose of Toradol, Benadryl, and Zofran. Recommend you give these medications a few hours to see if this breaks your migraine.  May use Fioricet in 8 hours.  If not effective, go to the ED for further evaluation and treatment. Go to the ED sooner if you develop any new/concerning symptoms; significant worsening of headache, nausea/vomiting, change in vision, worsening left sided tingling/weakness, etc.  Hope you feel better soon!  Recommend continued management by neurologist for migraine management.  Migraine Headache  A migraine headache is a very strong throbbing pain on one side or both sides of your head. Migraines can also cause other symptoms. Talk with your doctor about what things may bring on (trigger) your migraine headaches. Follow these instructions at home: Medicines  Take over-the-counter and prescription medicines only as told by your doctor.  Do not drive or use heavy machinery while taking prescription pain medicine.  To prevent or treat constipation while you are taking prescription pain medicine, your doctor may recommend that you: ? Drink enough fluid to keep your pee (urine) clear or pale yellow. ? Take over-the-counter or prescription medicines. ? Eat foods that are high in fiber. These include fresh fruits and vegetables, whole grains, and beans. ? Limit foods that are high in fat and processed sugars. These include fried and sweet foods. Lifestyle  Avoid alcohol.  Do not use any products that contain nicotine or tobacco, such as cigarettes and e-cigarettes. If you need help quitting, ask your doctor.  Get at least 8 hours of sleep every night.  Limit your stress. General instructions   Keep a journal to find out what may bring on your migraines. For example, write down: ? What you eat and drink. ? How much sleep you  get. ? Any change in what you eat or drink. ? Any change in your medicines.  If you have a migraine: ? Avoid things that make your symptoms worse, such as bright lights. ? It may help to lie down in a dark, quiet room. ? Do not drive or use heavy machinery. ? Ask your doctor what activities are safe for you.  Keep all follow-up visits as told by your doctor. This is important. Contact a doctor if:  You get a migraine that is different or worse than your usual migraines. Get help right away if:  Your migraine gets very bad.  You have a fever.  You have a stiff neck.  You have trouble seeing.  Your muscles feel weak or like you cannot control them.  You start to lose your balance a lot.  You start to have trouble walking.  You pass out (faint). This information is not intended to replace advice given to you by your health care provider. Make sure you discuss any questions you have with your health care provider. Document Released: 07/21/2008 Document Revised: 07/06/2018 Document Reviewed: 03/30/2016 Elsevier Interactive Patient Education  2019 [employer].

## 2018-12-30 NOTE — Progress Notes (Signed)
Patient ID: AREN CHERNE DOB: 1956-08-18 AGE: 63 y.o. MRN: [id number]   PCP: [name], MD   Chief Complaint:  Chief Complaint  Patient presents with  . Nausea    x2wk  . Blurred Vision    x2wk     Subjective:    HPI:  TRAM WRENN is a 63 y.o. female presents for evaluation  Chief Complaint  Patient presents with  . Nausea    x2wk  . Blurred Vision    x67wk   63 year old female presents to [facility] with two week history of headache. Feels per her typical migraine. Rates pain as 6/10 in severity. Located on the left side of her face. Associated floaters in vision from left eye. Associated upset stomach and difficulty focusing. Patient with several year history of complex migraines. Currently under treatment by Dr. Orie Rout MD with [facility] in [place]. Patient receives regular myofacial massages, botox injections every 12 weeks, trigger point injections, accupuncture, dry needling sessions. Patient currently has Frova for her migraines. Has taken two doses last week and two doses earlier this week (is the most she is allowed to take), with no improvement. Neurologist then prescribed prednisone; 4 day taper, starting at 40mg . Today should be 10mg ; did not take dose, has not helped. Called neurologist's office today. Was told there was nothing else he could think to do. Patient called PCP. PCP called in Fioricet prescription to the pharmacy. Patient has not picked up yet.  Patient states she believes she is having intractable headache due to missing several of her migraine maintenance due to bronchitis last month (patient traveled to [place], cigarette smoke triggered bronchitis, patient took amoxicillin, bronchitis resolved). Patient states while she was sick, missed several appts including botox, trigger point injections, and massage.  Patient currently denies fever, chills, sweats, body aches (more than per her typical fibromyalgia),  dizziness/room-spinning sensation, presyncope/syncope, URI symptoms, chest pain, palpitations, SOB. Patient with baseline of imbalance and left arm and leg numbness/tingling; states no change in her baseline.  Patient with a history of complex migraines associated with left-sided numbness as well as weakness. Patient regularly followed by Dr. [name], an internist in [place] with [facility]. Patient seen this time last year, 12/09/2017, by Dr. Sarina Ill MD with [facility]. Was evaluated for new onset hearing loss and vertigo of the right ear; however, continues to have pain/heaviness on the right lower face from eye to chin (since right otitis media, prescribed antibiotics and prednisone, evaluated by ENT and dentist). Maxillofacial CT revealed clear sinuses, no significant abnormality. MRI of the brain and MRA of the head revealed no significant abnormality. Patient with extensive bloodwork, all WNL (slightly elevated WBC count attributed to recent use of oral steroids). Audiogram and EMG unremarkable.   Patient with additional history of right shoulder injury (1993) with chronic pain, radiates to head and triggers left-sided migraines (per neurologist's note). Associated fibromyalgia.   Patient last seen for migraine at [facility] ED on 08/26/2018 (four months ago). Reported 3-4 day history of left-sided headache. Blood pressure 181/94. Patient given ED migraine cocktail (IV fluids, and Ketorolac, Compazine, and Benadryl via IV). Patient reported headache resolution with medication; was discharged home, however warned about possible CVA, patient declined imaging.  Patient seen for migraine and left sided weakness on 04/02/2017 at [facility] ED. Underwent blood work (sed rate, BMP, CBC), UA, EKG, CT head, and MRI brain. Also given migraine cocktail. Patient discharged with workup normal, advised to follow-up  with neurologist, had appointment with Dr. Sarina Ill MD next week.  A limited  review of symptoms was performed, pertinent positives and negatives as mentioned in HPI.  The following portions of the patient's history were reviewed and updated as appropriate: allergies, current medications and past medical history.  Patient Active Problem List   Diagnosis Date Noted  . Postoperative history of checked last year 04/22/2017  . Weakness of left arm 04/22/2017  . Numbness and tingling in left arm 04/22/2017  . Lower back pain 04/02/2016  . Acute stress disorder 02/25/2015  . Anxiety 02/25/2015  . Airway hyperreactivity 02/25/2015  . Bradycardia 02/25/2015  . Hypersomnia 02/25/2015  . Clinical depression 02/25/2015  . Elevated blood sugar 02/25/2015  . 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 Acid reflux 02/25/2015  . 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 H/O disease 02/25/2015  . Hypercholesteremia 02/25/2015  . Headache, migraine 02/25/2015  . Muscle ache 02/25/2015  . Allergic rhinitis, seasonal 02/25/2015  . Avitaminosis D 02/25/2015    Allergies  Allergen Reactions  . Flonase  [Fluticasone Propionate] Other (See Comments)  . Latex Itching  . Codeine     Rash/vomiting  . Fish Oil Itching  . Sulfa Antibiotics     abdominal pain    Current Outpatient Medications on File Prior to Visit  Medication Sig Dispense Refill  . albuterol (VENTOLIN HFA) 108 (90 Base) MCG/ACT inhaler 1 inhalation as needed    . atorvastatin (LIPITOR) 20 MG tablet TAKE 1 TABLET (20 MG TOTAL) BY MOUTH DAILY. 90 tablet 3  . botulinum toxin Type A (BOTOX) 100 UNITS SOLR injection Botox - Historical Medication  once every 3 months for migraines Active    . butalbital-aspirin-caffeine (FIORINAL) 50-325-40 MG capsule     . cetirizine (ZYRTEC) 10 MG tablet Take by mouth.    . EPINEPHrine 0.3 mg/0.3 mL IJ SOAJ injection 0.3 mg once.   1  . fluticasone (FLOVENT HFA) 110 MCG/ACT inhaler Inhale 2 puffs into the lungs daily.     . frovatriptan (FROVA) 2.5 MG tablet Take 2.5 mg by mouth as needed.     .  furosemide (LASIX) 20 MG tablet furosemide 20 mg tablet  Take 1 tablet twice a day by oral route as needed.    . lansoprazole (PREVACID) 30 MG capsule Take 30 mg by mouth daily.  12  . levothyroxine (SYNTHROID, LEVOTHROID) 50 MCG tablet   5  . MAGNESIUM PO Take 2 tablets by mouth daily.    . Multiple Vitamins-Minerals (ANTIOXIDANT PO) Take 1 tablet by mouth daily. Flame Rx    . NON FORMULARY Take by mouth 1 day or 1 dose.    . predniSONE (DELTASONE) 5 MG tablet prednisone 5 mg tablet    . spironolactone (ALDACTONE) 25 MG tablet     . THYROID PO Take by mouth 1 day or 1 dose. supplement    . TRIAMCINOLONE ACETONIDE, NASAL STEROIDS, Place 2 sprays into the nose at bedtime.     Marland Kitchen UNABLE TO FIND Take by mouth 1 day or 1 dose. ultraflora balance    . Vitamin D, Ergocalciferol, (DRISDOL) 1.25 MG (50000 UT) CAPS capsule   3  . zafirlukast (ACCOLATE) 10 MG tablet Take 10 mg by mouth 2 (two) times daily.     . diphenhydrAMINE (BENADRYL) 50 MG tablet Take 1 tablet (50 mg total) by mouth at bedtime as needed for up to 7 days for allergies. 7 tablet 0  . lidocaine (XYLOCAINE) 2 % solution Use as  directed 5 mLs in the mouth or throat every 4 (four) hours as needed for mouth pain. Gargle and swallow. (Patient not taking: Reported on 12/30/2018) 100 mL 0  . promethazine (PHENERGAN) 25 MG tablet promethazine 25 mg tablet     No current facility-administered medications on file prior to visit.        Objective:   Vitals:   12/30/18 1200  BP: (!) 156/110  Pulse: 80  Resp: 16  Temp: 98.1 F (36.7 C)  SpO2: 97%     Wt Readings from Last 3 Encounters:  12/30/18 183 lb (83 kg)  09/20/18 188 lb (85.3 kg)  08/26/18 184 lb (83.5 kg)    Physical Exam:   General Appearance:  Patient sitting comfortably on examination table. Conversational. Kermit Balo self-historian. In no acute distress. Afebrile.   Head:  Normocephalic, without obvious abnormality, atraumatic Facial symmetry; raising eyebrows, closing  eyes, puffing cheeks, smiling. Tongue and uvula midline.  Eyes:  PERRL, conjunctiva/corneas clear, EOM's intact. No nystagmus.  Ears:  Left ear canal WNL. No erythema or edema. No open wound. No visible purulent drainage. No tenderness with palpation over left tragus or with manipulation of left auricle. No visible erythema or edema of left mastoid. No tenderness with palpation over left mastoid. Right ear canal WNL. No erythema or edema. No open wound. No visible purulent drainage. No tenderness with palpation over right tragus or with manipulation of right auricle. No visible erythema or edema of right mastoid. No tenderness with palpation over right mastoid. Left TM WNL. Good light reflex. Visible landmarks. No erythema. No injection. No bulging or retraction. No visible perforation. No serous effusion. No visible purulent effusion. No tympanostomy tube. No scar tissue. Right TM: Good light reflex. Visible landmarks. No erythema. Hazy/cloudy appearance. No injection. No bulging or retraction. No visible perforation. Very minimal serous effusion. No visible purulent effusion. No tympanostomy tube. No scar tissue.  Nose: Nares normal. Septum midline. No visible polyps. No discharge. Normal mucosa. No sinus tenderness with percussion/palpation.  Throat: Lips, mucosa, and tongue normal; teeth and gums normal. Throat reveals no erythema. No postnasal drip. No visible cobblestoning. Tonsils with no enlargement or exudate. Uvula midline with no edema or erythema.  Neck: Supple, symmetrical, trachea midline, no adenopathy. Full ROM; no pain.  Lungs:   Clear to auscultation bilaterally, respirations unlabored. Good aeration. No rales, rhonchi, crackles or wheezing.  Heart:  Regular rate and rhythm, S1 and S2 normal, no murmur, rub, or gallop  Extremities: Extremities normal, atraumatic, no cyanosis or edema 5/5 upper and lower extremity strength. Finger to nose WNL. RAMs WNL. No gait abnormality. No pronator  drift.  Pulses: 2+ and symmetric  Skin: Skin color, texture, turgor normal, no rashes or lesions  Lymph nodes: Cervical, supraclavicular, and axillary nodes normal  Neurologic: Normal    Assessment & Plan:    Exam findings, diagnosis etiology and medication use and indications reviewed with patient. Follow-Up and discharge instructions provided. No emergent/urgent issues found on exam.  Patient education was provided.   Patient verbalized understanding of information provided and agrees with plan of care (POC), all questions answered. The patient is advised to call or return to clinic if condition does not see an improvement in symptoms, or to seek the care of the closest emergency department if condition worsens with the below plan.    Patient given 30mg  of Toradol IM, 50mg  of Benadryl PO, and 4mg  of Zofran ODT.    1. Intractable migraine with status migrainosus, unspecified  migraine type - ketorolac (TORADOL) 30 MG/ML injection 30 mg - diphenhydrAMINE (BENADRYL) capsule 50 mg - ondansetron (ZOFRAN) ODT 4 mg  Had long discussion with patient in regards to the limitations of [facility] prior to evaluation. [facility] cannot perform an EKG, blood work, or imaging (specifically head CT or brain MRI) - which, if patient went to the ED, may be ordered. Leodis Binet is unable to rule out more concerning causes of headache, specifically a CVA. Patient stated she understood and requested to be evaluated and treated regardless.  Patient with two week history of migraine. Feels per her typical, other than length. Patient's VSS (elevated BP, per her normal, 160/100 at office visit in Nov 2019, 181/94 at ED in Nov 2019), afebrile, in no acute distress, benign neuro exam. Patient has responded well in the past to a migraine cocktail at the ED (IV ketorolac, benadryl, and compazine). In the office at [facility], was able to give 30mg  IM injection of Toradol, 50mg  tablet oral of Benadryl, and 4mg  ODT of  Zofran. Offered patient wait here to see if symptom improvement or go home to see if symptom improvement. Patient decided to go home. Advised if this did not break migraine, she should go to the ED for further evaluation and treatment or call neurologist again. Advised patient continue to follow-up with neurologist for migraine management.   Darlin Priestly, MHS, PA-C Montey Hora, MHS, PA-C Advanced Practice Provider [facility]  [address], [facility], [place], [place] [address] (p):  [phone] [email or web address]

## 2019-01-04 ENCOUNTER — Telehealth: Payer: Self-pay | Admitting: Emergency Medicine

## 2019-01-04 NOTE — Telephone Encounter (Signed)
Left message following up on visit with [facility] 

## 2019-01-09 DIAGNOSIS — G43709 Chronic migraine without aura, not intractable, without status migrainosus: Secondary | ICD-10-CM | POA: Diagnosis not present

## 2019-01-12 DIAGNOSIS — G43909 Migraine, unspecified, not intractable, without status migrainosus: Secondary | ICD-10-CM | POA: Diagnosis not present

## 2019-01-17 MED FILL — VIT D2 1.25 MG (50,000 UNIT: 1.25 MG | 28 days supply | Qty: 4 | Fill #0

## 2019-01-19 MED FILL — FROVATRIPTAN SUCC 2.5 MG TA: 2.5 | 15 days supply | Qty: 9 | Fill #0

## 2019-01-19 MED FILL — ATORVASTATIN 20 MG TABLET: 20 | 90 days supply | Qty: 90 | Fill #0

## 2019-01-27 MED FILL — EMGALITY 120 MG/ML SOAJ: 120 | 30 days supply | Qty: 1 | Fill #0

## 2019-01-30 MED FILL — FLOVENT HFA 110 MCG INHALER: 110 | 30 days supply | Qty: 12 | Fill #0

## 2019-02-04 MED FILL — NURTEC 75 MG TBDP: 75 | 30 days supply | Qty: 8 | Fill #0

## 2019-02-06 MED FILL — ZAFIRLUKAST 10 MG TAB: 10 | 90 days supply | Qty: 180 | Fill #0

## 2019-02-06 MED FILL — VIT D2 1.25 MG (50,000 UNIT: 1.25 MG | 28 days supply | Qty: 4 | Fill #1

## 2019-02-07 MED FILL — predniSONE 5 MG TABS: 5 | 30 days supply | Qty: 30 | Fill #0

## 2019-02-20 MED FILL — FROVATRIPTAN SUCC 2.5 MG TA: 2.5 | 15 days supply | Qty: 9 | Fill #1

## 2019-02-23 MED FILL — EMGALITY 120 MG/ML SOAJ: 120 | 30 days supply | Qty: 1 | Fill #1

## 2019-02-23 MED FILL — FLOVENT HFA 110 MCG INHALER: 110 | 30 days supply | Qty: 12 | Fill #1

## 2019-02-23 MED FILL — BOTOX 200 UNITS VIAL: 200 | 84 days supply | Qty: 1 | Fill #0

## 2019-03-01 DIAGNOSIS — M542 Cervicalgia: Secondary | ICD-10-CM | POA: Diagnosis not present

## 2019-03-01 DIAGNOSIS — M791 Myalgia, unspecified site: Secondary | ICD-10-CM | POA: Diagnosis not present

## 2019-03-06 MED FILL — VIT D2 1.25 MG (50,000 UNIT: 1.25 MG | 28 days supply | Qty: 4 | Fill #2

## 2019-03-06 MED FILL — predniSONE 5 MG TABS: 5 | 30 days supply | Qty: 30 | Fill #1

## 2019-03-09 DIAGNOSIS — R7309 Other abnormal glucose: Secondary | ICD-10-CM | POA: Diagnosis not present

## 2019-03-09 DIAGNOSIS — E559 Vitamin D deficiency, unspecified: Secondary | ICD-10-CM | POA: Diagnosis not present

## 2019-03-09 DIAGNOSIS — G501 Atypical facial pain: Secondary | ICD-10-CM | POA: Diagnosis not present

## 2019-03-09 DIAGNOSIS — E785 Hyperlipidemia, unspecified: Secondary | ICD-10-CM | POA: Diagnosis not present

## 2019-03-09 DIAGNOSIS — E039 Hypothyroidism, unspecified: Secondary | ICD-10-CM | POA: Diagnosis not present

## 2019-03-14 DIAGNOSIS — Z760 Encounter for issue of repeat prescription: Secondary | ICD-10-CM | POA: Diagnosis not present

## 2019-03-16 DIAGNOSIS — G629 Polyneuropathy, unspecified: Secondary | ICD-10-CM | POA: Diagnosis not present

## 2019-03-16 DIAGNOSIS — Z01411 Encounter for gynecological examination (general) (routine) with abnormal findings: Secondary | ICD-10-CM | POA: Diagnosis not present

## 2019-03-16 DIAGNOSIS — E785 Hyperlipidemia, unspecified: Secondary | ICD-10-CM | POA: Diagnosis not present

## 2019-03-16 DIAGNOSIS — E039 Hypothyroidism, unspecified: Secondary | ICD-10-CM | POA: Diagnosis not present

## 2019-03-16 DIAGNOSIS — G501 Atypical facial pain: Secondary | ICD-10-CM | POA: Diagnosis not present

## 2019-03-16 DIAGNOSIS — Z0000 Encounter for general adult medical examination without abnormal findings: Secondary | ICD-10-CM | POA: Diagnosis not present

## 2019-03-16 DIAGNOSIS — Z23 Encounter for immunization: Secondary | ICD-10-CM | POA: Diagnosis not present

## 2019-03-16 DIAGNOSIS — I10 Essential (primary) hypertension: Secondary | ICD-10-CM | POA: Diagnosis not present

## 2019-03-16 DIAGNOSIS — E663 Overweight: Secondary | ICD-10-CM | POA: Diagnosis not present

## 2019-03-16 MED FILL — FROVATRIPTAN SUCC 2.5 MG TA: 2.5 | 30 days supply | Qty: 18 | Fill #0

## 2019-03-21 MED FILL — EMGALITY 120 MG/ML SOAJ: 120 | 30 days supply | Qty: 1 | Fill #2

## 2019-03-22 MED FILL — LEVOTHYROXINE 50 MCG TABLET: 50 | 90 days supply | Qty: 90 | Fill #0

## 2019-03-27 DIAGNOSIS — G43709 Chronic migraine without aura, not intractable, without status migrainosus: Secondary | ICD-10-CM | POA: Diagnosis not present

## 2019-03-27 MED FILL — RIZATRIPTAN BENZOATE 10 MG: 10 | 20 days supply | Qty: 12 | Fill #0

## 2019-03-30 MED FILL — predniSONE 5 MG TABS: 5 | 30 days supply | Qty: 30 | Fill #2

## 2019-03-31 MED FILL — FLOVENT HFA 110 MCG INHALER: 110 | 30 days supply | Qty: 12 | Fill #2

## 2019-04-03 MED FILL — ATORVASTATIN 40 MG TABLET: 40 | 90 days supply | Qty: 90 | Fill #0

## 2019-04-04 MED FILL — VIT D2 1.25 MG (50,000 UNIT: 1.25 MG | 28 days supply | Qty: 4 | Fill #3

## 2019-04-04 MED FILL — NURTEC 75 MG TBDP: 75 | 30 days supply | Qty: 8 | Fill #1

## 2019-04-17 ENCOUNTER — Other Ambulatory Visit: Payer: Self-pay

## 2019-04-17 ENCOUNTER — Ambulatory Visit: Payer: 59 | Attending: Internal Medicine

## 2019-04-17 DIAGNOSIS — G8929 Other chronic pain: Secondary | ICD-10-CM | POA: Insufficient documentation

## 2019-04-17 DIAGNOSIS — M25511 Pain in right shoulder: Secondary | ICD-10-CM | POA: Diagnosis not present

## 2019-04-17 DIAGNOSIS — M542 Cervicalgia: Secondary | ICD-10-CM | POA: Diagnosis not present

## 2019-04-17 NOTE — Therapy (Signed)
[place] MAIN [facility] SERVICES [place], [place], [address] Phone: [phone]   Fax:  [phone]  Physical Therapy Evaluation  Patient Details  Name: Rachel Fry MRN: [id number] Date of Birth: Sep 02, 1956 Referring Provider (PT): Dr. Karlton Lemon   Encounter Date: 04/17/2019  PT End of Session - 04/18/19 1508    Visit Number  1    Number of Visits  25    Date for PT Re-Evaluation  07/10/19    PT Start Time  1625    PT Stop Time  1735    PT Time Calculation (min)  70 min    Activity Tolerance  Patient tolerated treatment well    Behavior During Therapy  [facility] for tasks assessed/performed       Past Medical History:  Diagnosis Date  . Allergy   . Migraines   . Numbness and tingling    left side of body, occasionally right side   . S/P Botox injection     Past Surgical History:  Procedure Laterality Date  . ARM NEUROPLASTY Right 1994 and 1996   for chronic pain  . CESAREAN SECTION    . OVARIAN CYST REMOVAL  1973  . TONSILLECTOMY AND ADENOIDECTOMY    . TUBAL LIGATION  1988    There were no vitals filed for this visit.   Subjective Assessment - 04/17/19 1702    Subjective  Fibromyalgia with focused pain in right upper quadrant (R shoulder, R side of neck)    Pertinent History  Pt fractured her R radius in ulna in 1993. She underwent a closed reduction with casting. Following removal of the cast she was unable to move her R wrist. She had R wrist surgery the following year and states that they were able to move her wrist under anesthesia however when awake she was still unable to move her wrist. She underwent a repeat R wrist surgery the following year followed by 1 year of physical therapy. She states that she had a NCV and multiple testing which didn't reveal any issues. She believes that the pain in her RUE was originating from her neck. She started seeking multiple treatments in 2012 including chiropractic, dry needling,  acupuncture, myofascial release for her widespread chronic pain. She states that she saw some improvement at that time. Approximately 2 years ago she started having heaviness and tingling in the L arm and L leg. She states that they have done an MRI of her brain, neck, and thoracic spine without any notable findings or explanations. They have also done NCV studies for both sides without any significant findings. She has had extensive testing without any significant findings to explain her symptoms. She states that the RLE is relatively unaffected. Prior to coming to therapy she was seeing a massage therapist for myofascial release and seeing chiropractor for dry needling and adjustments. She also sees another massage therapist for general massage for head, neck, and back. She has a history of chronic migraines for at least 10 years. She reports that her migraines are L temporal and she has started having some L orbital pain more recently as well with her migraines. She reports that her migraines are constant and are always present generally "at a low level." She also gets nausea and upset stomach during the migraines and feels like her balance is off. She saw ENT and states that they did vestibular testing with a deficit identified in her R ear. She reports that she worked with  vestibular therapy and upon retesting it has "improved which surprised the ENT." Her neurologist believes that the rest of her symptoms in her BUE/LLE are a manifestation of migraines. She receives botox injections every 12 weeks for the migraines by her neurologist. She also gets trigger point injections every 4 weeks as well. Most of the focus from her chiropractor is R upper quadrant (shoulder, R side of neck). Pt does have occasional L shoulder pain.    Diagnostic tests  negative MRI for cervical, thoracic, brain    Patient Stated Goals  Decrease R shoulder/arm/neck pain    Currently in Pain?  Yes    Pain Score  5     Pain Location   Shoulder    Pain Orientation  Right    Pain Descriptors / Indicators  Sharp;Stabbing    Pain Type  Chronic pain    Pain Radiating Towards  None    Pain Onset  More than a month ago    Pain Frequency  Constant    Aggravating Factors   cervical rotation, worst with R rotation, otherwise unknown aggravating factors. No trouble with overhead reaching    Pain Relieving Factors  myofascial release, massage therapy, trigger point injections, trigger point dry needling    Multiple Pain Sites  Yes        SUBJECTIVE Chief complaint:  Pt fractured her R radius in ulna in 1993. She underwent a closed reduction with casting. Following removal of the cast she was unable to move her R wrist. She had R wrist surgery the following year and states that they were able to move her wrist under anesthesia however when awake she was still unable to move her wrist. She underwent a repeat R wrist surgery the following year followed by 1 year of physical therapy. She states that she had a NCV and multiple testing which didn't reveal any issues. She believes that the pain in her RUE was originating from her neck. She started seeking multiple treatments in 2012 including chiropractic, dry needling, acupuncture, myofascial release for her widespread chronic pain. She states that she saw some improvement at that time. Approximately 2 years ago she started having heaviness and tingling in the L arm and L leg. She states that they have done an MRI of her brain, neck, and thoracic spine without any notable findings or explanations. They have also done NCV studies for both sides without any significant findings. She has had extensive testing without any significant findings to explain her symptoms. She states that the RLE is relatively unaffected. Prior to coming to therapy she was seeing a massage therapist for myofascial release and seeing chiropractor for dry needling and adjustments. She also sees another massage therapist for  general massage for head, neck, and back. She has a history of chronic migraines for at least 10 years. She reports that her migraines are L temporal and she has started having some L orbital pain more recently as well with her migraines. She reports that her migraines are constant and are always present generally "at a low level." She also gets nausea and upset stomach during the migraines and feels like her balance is off. She saw ENT and states that they did vestibular testing with a deficit identified in her R ear. She reports that she worked with vestibular therapy and upon retesting it has "improved which surprised the ENT." Her neurologist believes that the rest of her symptoms in her BUE/LLE are a manifestation of migraines. She receives botox  injections every 12 weeks for the migraines by her neurologist. She also gets trigger point injections every 4 weeks as well. Most of the focus from her chiropractor is R upper quadrant (shoulder, R side of neck). Pt does have occasional L shoulder pain. "Sometimes my R shoulder hurts and then the next day it switches over to the L side."  Onset: 1993 Referring Dx: Fibromyalgia MD: Dr. Karlton Lemon Pain: 5/10 Present, 0/10 Best, 6/10 Worst: Aggravating factors: cervical rotation, worst with R rotation Easing factors: myofascial release, massage therapy, dry needling, trigger point 24 hour pain behavior: No pattern that pt can identify Recent neck/shoulder trauma: No, pt reports that she is unsure Pain quality: pain quality: sharp, stabbing Radiating pain: No, not that pt is aware Numbness/Tingling: Yes, chronic longstanding hand numbness/tingling especially in digits 4 and 5; Follow-up appointment with MD: Yes Dominant hand: left Imaging: Yes     OBJECTIVE  MUSCULOSKELETAL: Tremor: Normal Bulk: Normal Tone: Normal  Cervical Screen AROM: WFL, painful with R rotation, R and L lateral flexion, and extension. Painless with L rotation and flexion with  overpressure. Spurlings A (ipsilateral lateral flexion/axial compression): R: Negative L: Negative Spurlings B (ipsilateral lateral flexion/contralateral rotation/axial compression): R: Negative L: Negative Repeated movement: No centralization or peripheralization with protraction or retraction Hoffman Sign (cervical cord compression): R: Positive L: Positive ULTT Median: R: Positive L: Not done ULTT Ulnar: R: Positive L: Not done ULTT Radial: R: Positive L: Not done  Elbow Screen Elbow AROM: Within Normal Limits  Palpation Pain with palpation to anterior, posterior, lateral, and superior R shoulder. Most significant pain along R upper trap with radiating pain into her R temporal and scalp areas.  Strength R/L 5/5 Shoulder flexion (anterior deltoid/pec major/coracobrachialis, axillary n. (C5-6) and musculocutaneous n. (C5-7)) 5/5 Shoulder abduction (deltoid/supraspinatus, axillary/suprascapular n, C5), Painful 5/5 Shoulder external rotation (infraspinatus/teres minor) 5/5 Shoulder internal rotation (subcapularis/lats/pec major) 5/5 Elbow flexion (biceps brachii, brachialis, brachioradialis, musculoskeletal n, C5-6) 5/5 Elbow extension (triceps, radial n, C7) 5/5 Wrist Extension 5/5 Wrist Flexion 5/5 Finger adduction (interossei, ulnar n, T1)  AROM Full measurements deferred but gross UE AROM screen is negative for limitation or pain in all directions. Appears symmetrical bilaterally. [place] and HBB grossly WNL and painless.   Accessory Motions/Glides Glenohumeral: Posterior: R: normal L: not examined Pt reports superficial pain due to pressure from hand placement during all accessory motion testing Inferior: R: normal L: not examined Anterior: R: not examined L: not examined   NEUROLOGICAL:  Mental Status Patient is oriented to person, place and time.  Recent memory is intact.  Remote memory is intact.  Attention span and concentration are intact.  Expressive speech is  intact.  Patient's fund of knowledge is within normal limits for educational level.  Sensation Grossly intact to light touch bilateral UE as determined by testing dermatomes C2-T2 Proprioception and hot/cold testing deferred on this date  Reflexes Deferred on this date  Coordination/Cerebellar Finger to nose: R: not examined L: not examined Pronator drift: not examined Finger opposition: R: not examined L: not examined   SPECIAL TESTS  Rotator Cuff  R shoulder Drop Arm Test: Negative Painful Arc (Pain from 60 to 120 degrees scaption): Negative Infraspinatus Muscle Test: Negative   Subacromial Impingement R shoulder Hawkins-Kennedy: Negative Neer (Block scapula, PROM flexion): Not examined Painful Arc (Pain from 60 to 120 degrees scaption): Negative Empty Can: Negative External Rotation Resistance: Negative Horizontal Adduction: Not done Scapular Assist: Not done  Objective measurements completed on examination: See above findings.              PT Education - 04/18/19 1508    Education provided  Yes    Education Details  Plan of care    Person(s) Educated  Patient    Methods  Explanation    Comprehension  Verbalized understanding       PT Short Term Goals - 04/18/19 1527      PT SHORT TERM GOAL #1   Title  Pt will be independent with HEP in order to improve RUE use and decrease pain in order to improve pain-free function at home and work.    Time  6    Period  Weeks    Status  New    Target Date  05/29/19        PT Long Term Goals - 04/18/19 1527      PT LONG TERM GOAL #1   Title  Pt will decrease quick DASH score by at least 8% in order to demonstrate clinically significant reduction in disability.    Baseline  04/17/19: To complete at next visit    Time  12    Period  Weeks    Status  New    Target Date  07/10/19      PT LONG TERM GOAL #2   Title  Pt will decrease worst pain as reported on NPRS by at least 3 points  in order to demonstrate clinically significant reduction in pain.    Baseline  04/17/19: Worst: 6/10    Time  12    Period  Weeks    Status  New    Target Date  07/10/19      PT LONG TERM GOAL #3   Title  Pt will demonstrate pain-free cervical motion in order to perform IADLs such as driving and household chores without increase in R cervical/R shoulder pain    Baseline  04/17/19: Pain with R cervical rotation, bilateral lateral flexion, and extension    Time  12    Period  Weeks    Status  New    Target Date  07/10/19             Plan - 04/18/19 1524    Clinical Impression Statement  Pt is a pleasant 63 year-old female referred for trigger point dry needling due to fibromyalgia pain. Pt has an extensive medical history with multiple providers and tests and no discernable organic cause for her symptoms. She complains of vague, generalized pain symptoms with weakness that alternates laterality and does not follow a discernable neurological or musculoskeletal pattern. Per patient, her neurologist has attributed most of her symptoms to her persistent and unrelenting migraines. It appears that her R neck and R shoulder are the most problematic with respect to her current pain. She is mostly unable to provide aggravating and easing factors except that in the past massage therapy, chiropractic, trigger point injections, botox, and trigger point dry needling have been helpful. PT examination today is mostly unremarkable. She appears to have full and painless AROM of R shoulder as well as full and painless strength testing of R shoulder. She has a positive Hoffman sign bilaterally but cervical MRI did not show any central cord compression. She reports pain with light palpation to anterior, posterior, lateral, and superior R shoulder. She also reports some R posterior neck/upper trap pain with R cervical rotation, bilateral lateral flexion, and cervical extension. Most significant pain along  R upper  trap with radiating pain into her R temporal and scalp areas. She also has positive upper limb nerve tension testing however she reports worse pain with traditionally alleviating ipsilateral cervical side bending and improving symptoms with contralateral lateral flexion. She reports that her pain was less when she was taking Lexapro years ago but has not been on any SSRI medications since that time. 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 Pt will benefit from PT services to address deficits in pain in order to return to full function at home and work with less shoulder pain.    Personal Factors and Comorbidities  Age;Behavior Pattern;Comorbidity 1;Comorbidity 2;Comorbidity 3+;Fitness;Past/Current Experience;Profession;Time since onset of injury/illness/exacerbation    Comorbidities  Migraines, depression, anxiety, widespread chronic pain    Examination-Activity Limitations  Caring for Others;Carry;Lift;Sleep    Examination-Participation Restrictions  Cleaning;Community Activity;Interpersonal Relationship    Stability/Clinical Decision Making  Unstable/Unpredictable    Clinical Decision Making  High    Rehab Potential  Fair    Clinical Impairments Affecting Rehab Potential  chronic pain, motivation, multiple treatment modalities    PT Frequency  2x / week    PT Duration  12 weeks    PT Treatment/Interventions  ADLs/Self Care Home Management;Dry needling;Manual techniques;Therapeutic exercise;Therapeutic activities;Neuromuscular re-education;Aquatic Therapy;Biofeedback;Canalith Repostioning;Cryotherapy;Electrical Stimulation;Iontophoresis 4mg /ml Dexamethasone;Moist Heat;Ultrasound;Patient/family education;Passive range of motion;Energy conservation;Vestibular;Joint Manipulations    PT Next Visit Plan  Initiate HEP, perform trigger point dry needling    PT Home Exercise Plan  None currently    Consulted and Agree with Plan of Care  Patient       Patient will  benefit from skilled therapeutic intervention in order to improve the following deficits and impairments:  Impaired perceived functional ability, Impaired UE functional use, Pain  Visit Diagnosis: 1. Chronic right shoulder pain   2. Cervicalgia        Problem List Patient Active Problem List   Diagnosis Date Noted  . Postoperative history of checked last year 04/22/2017  . Weakness of left arm 04/22/2017  . Numbness and tingling in left arm 04/22/2017  . Lower back pain 04/02/2016  . Acute stress disorder 02/25/2015  . Anxiety 02/25/2015  . Airway hyperreactivity 02/25/2015  . Bradycardia 02/25/2015  . Hypersomnia 02/25/2015  . Clinical depression 02/25/2015  . Elevated blood sugar 02/25/2015  . 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 Acid reflux 02/25/2015  . 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 H/O disease 02/25/2015  . Hypercholesteremia 02/25/2015  . Headache, migraine 02/25/2015  . Muscle ache 02/25/2015  . Allergic rhinitis, seasonal 02/25/2015  . Avitaminosis D 02/25/2015   Phillips Grout PT, DPT, GCS  , 04/18/2019, 5:05 PM  [place] MAIN [facility] SERVICES [address] [place], [place], [address] Phone: [phone]   Fax:  [phone]  Name: Rachel Fry MRN: [id number] Date of Birth: 02-Aug-1956

## 2019-04-19 ENCOUNTER — Other Ambulatory Visit: Payer: Self-pay

## 2019-04-19 ENCOUNTER — Ambulatory Visit: Payer: 59

## 2019-04-19 DIAGNOSIS — G8929 Other chronic pain: Secondary | ICD-10-CM | POA: Diagnosis not present

## 2019-04-19 DIAGNOSIS — M25511 Pain in right shoulder: Secondary | ICD-10-CM | POA: Diagnosis not present

## 2019-04-19 DIAGNOSIS — M542 Cervicalgia: Secondary | ICD-10-CM

## 2019-04-19 NOTE — Therapy (Addendum)
[place] MAIN [facility] SERVICES [address] [place], [place], [address] Phone: [phone]   Fax:  [phone]  Physical Therapy Treatment  Patient Details  Name: Rachel Fry MRN: [id number] Date of Birth: [birth year] Referring Provider (PT): Dr. Karlton Lemon   Encounter Date: 04/19/2019  PT End of Session - 04/19/19 0924    Visit Number  1    Number of Visits  25    Date for PT Re-Evaluation  07/10/19    PT Start Time  0825    PT Stop Time  0910    PT Time Calculation (min)  45 min    Activity Tolerance  Patient tolerated treatment well    Behavior During Therapy  [facility] for tasks assessed/performed       Past Medical History:  Diagnosis Date  . Allergy   . Migraines   . Numbness and tingling    left side of body, occasionally right side   . S/P Botox injection     Past Surgical History:  Procedure Laterality Date  . ARM NEUROPLASTY Right 1994 and 1996   for chronic pain  . CESAREAN SECTION    . OVARIAN CYST REMOVAL  1973  . TONSILLECTOMY AND ADENOIDECTOMY    . TUBAL LIGATION  1988    There were no vitals filed for this visit.  Subjective Assessment - 04/19/19 0823    Subjective  Pt reports that she is doing alright today. She complains of 2/10 R sided neck pain upon arrival. She also reports she is having a mild migraine. Otherwise no specific questions or concerns currently.    Pertinent History  Pt fractured her R radius in ulna in 1993. She underwent a closed reduction with casting. Following removal of the cast she was unable to move her R wrist. She had R wrist surgery the following year and states that they were able to move her wrist under anesthesia however when awake she was still unable to move her wrist. She underwent a repeat R wrist surgery the following year followed by 1 year of physical therapy. She states that she had a NCV and multiple testing which didn't reveal any issues. She believes that the pain in her RUE was  originating from her neck. She started seeking multiple treatments in 2012 including chiropractic, dry needling, acupuncture, myofascial release for her widespread chronic pain. She states that she saw some improvement at that time. Approximately 2 years ago she started having heaviness and tingling in the L arm and L leg. She states that they have done an MRI of her brain, neck, and thoracic spine without any notable findings or explanations. They have also done NCV studies for both sides without any significant findings. She has had extensive testing without any significant findings to explain her symptoms. She states that the RLE is relatively unaffected. Prior to coming to therapy she was seeing a massage therapist for myofascial release and seeing chiropractor for dry needling and adjustments. She also sees another massage therapist for general massage for head, neck, and back. She has a history of chronic migraines for at least 10 years. She reports that her migraines are L temporal and she has started having some L orbital pain more recently as well with her migraines. She reports that her migraines are constant and are always present generally "at a low level." She also gets nausea and upset stomach during the migraines and feels like her balance is off. She saw ENT and states  that they did vestibular testing with a deficit identified in her R ear. She reports that she worked with vestibular therapy and upon retesting it has "improved which surprised the ENT." Her neurologist believes that the rest of her symptoms in her BUE/LLE are a manifestation of migraines. She receives botox injections every 12 weeks for the migraines by her neurologist. She also gets trigger point injections every 4 weeks as well. Most of the focus from her chiropractor is R upper quadrant (shoulder, R side of neck). Pt does have occasional L shoulder pain.    Diagnostic tests  negative MRI for cervical, thoracic, brain    Patient  Stated Goals  Decrease R shoulder/arm/neck pain    Currently in Pain?  Yes    Pain Location  Shoulder    Pain Orientation  Right    Pain Descriptors / Indicators  Sharp    Pain Type  Chronic pain    Pain Onset  More than a month ago         [facility] PT Assessment - 04/19/19 1129      Observation/Other Assessments   Other Surveys   Quick Dash;Neck Disability Index    Neck Disability Index   30%    Quick DASH   50%          TREATMENT   Manual Therapy  UBE 2 min forward/2 min backwards for warm-up during history; Pt completed QuickDASH (50%) and NDI (30%) with moist heat pack on R shoulder/neck (unbilled); Supine gentle cervical manual traction 15s hold, 15s rest x 3 bouts; Gentle cervical PROM rotation; R upper trap stretch 30s hold x 2; R levator scapulae stretch x 30s; R first rib mobilizations, grade I, 30s/bout x 1 bouts; R median nerve glides x 10; C2-C7 CPA mobilizations, grade I, 30s/bout x 1 bout/level; Gentle IASTM to R upper and mid trap, levator scapulae, and rhomboids. Utilized fanning and sweeping techniques with mild superficial reddening of the skin noted;     Trigger Point Dry Needling (TDN), unbilled Education performed with patient regarding potential benefit of TDN. Reviewed precautions and risks with patient. Reviewed special precautions/risks over lung fields which include pneumothorax. Reviewed signs and symptoms of pneumothorax and advised pt to go to ER immediately if these symptoms develop advise them of dry needling treatment. Extensive time spent with pt to ensure full understanding of TDN risks. Pt provided verbal consent to treatment. TDN performed to with 4,  0.3 x 50 single needle placements to R upper trap. 2 performed supine and 2 performed prone. Local twitch response (LTR) during 3 of 4 placement. Pistoning technique utilized. Improved pain-free cervical motion following intervention.    Pt educated throughout session about proper posture and  technique with exercises. Improved exercise technique, movement at target joints, use of target muscles after min to mod verbal, visual, tactile cues.    Pt completed QuickDASH and NDI scoring 50% and 30% respectively. This indicates significant disability related to her neck/shoulder complains. She reports decreased R neck/shoulder pain with cervical rotation at end of session. Therapist encouraged pt to monitor symptoms to direct interventions in neck session. Pt reports that she only sleeps approximately 6 hours per night on average. Therapist provided education about the relationship between sleep and chronic pain. Will provide further education at next visit. In addition will need to establish an HEP once appropriate as well as discuss a graded aerobic exercise program. Pt will benefit from PT services to address deficits in pain in order to return  to full function at home and work with less pain.                          PT Short Term Goals - 04/18/19 1527      PT SHORT TERM GOAL #1   Title  Pt will be independent with HEP in order to improve RUE use and decrease pain in order to improve pain-free function at home and work.    Time  6    Period  Weeks    Status  New    Target Date  05/29/19        PT Long Term Goals - 04/19/19 1130      PT LONG TERM GOAL #1   Title  Pt will decrease quick DASH score by at least 8% in order to demonstrate clinically significant reduction in disability.    Baseline  04/17/19: To complete at next visit; 04/19/19: 50%    Time  12    Period  Weeks    Status  New    Target Date  07/10/19      PT LONG TERM GOAL #2   Title  Pt will decrease worst pain as reported on NPRS by at least 3 points in order to demonstrate clinically significant reduction in pain.    Baseline  04/17/19: Worst: 6/10    Time  12    Period  Weeks    Status  New    Target Date  07/10/19      PT LONG TERM GOAL #3   Title  Pt will demonstrate pain-free cervical  motion in order to perform IADLs such as driving and household chores without increase in R cervical/R shoulder pain    Baseline  04/17/19: Pain with R cervical rotation, bilateral lateral flexion, and extension    Time  12    Period  Weeks    Status  New    Target Date  07/10/19      PT LONG TERM GOAL #4   Title  Pt will demonstrate decrease in NDI by at least 19% in order to demonstrate clinically significant reduction in disability related to neck injury/pain    Baseline  04/19/19: 30%    Time  12    Period  Weeks    Status  New    Target Date  07/10/19            Plan - 04/19/19 0924    Clinical Impression Statement  Pt reports decreased R neck/shoulder pain with cervical rotation at end of session. Therapist encouraged pt to monitor symptoms to direct interventions in neck session. Pt reports that she only sleeps approximately 6 hours per night on average. Therapist provided education about the relationship between sleep and chronic pain. Will provide further education at next visit. In addition will need to establish an HEP once appropriate as well as discuss a graded aerobic exercise program. Pt will benefit from PT services to address deficits in pain in order to return to full function at home and work with less pain.    Personal Factors and Comorbidities  Age;Behavior Pattern;Comorbidity 1;Comorbidity 2;Comorbidity 3+;Fitness;Past/Current Experience;Profession;Time since onset of injury/illness/exacerbation    Comorbidities  Migraines, depression, anxiety, widespread chronic pain    Examination-Activity Limitations  Caring for Others;Carry;Lift;Sleep    Examination-Participation Restrictions  Cleaning;Community Activity;Interpersonal Relationship    Stability/Clinical Decision Making  Unstable/Unpredictable    Rehab Potential  Fair    Clinical Impairments Affecting  Rehab Potential  chronic pain, motivation, multiple treatment modalities    PT Frequency  2x / week    PT  Duration  12 weeks    PT Treatment/Interventions  ADLs/Self Care Home Management;Dry needling;Manual techniques;Therapeutic exercise;Therapeutic activities;Neuromuscular re-education;Aquatic Therapy;Biofeedback;Canalith Repostioning;Cryotherapy;Electrical Stimulation;Iontophoresis 4mg /ml Dexamethasone;Moist Heat;Ultrasound;Patient/family education;Passive range of motion;Energy conservation;Vestibular;Joint Manipulations    PT Next Visit Plan  Initiate HEP once appropriate, perform trigger point dry needling and manual techniques until pt can tolerate active strengthening, provide education about insomnia and its relationship to chronic pain, additional chronic pain education    PT Home Exercise Plan  None currently    Consulted and Agree with Plan of Care  Patient       Patient will benefit from skilled therapeutic intervention in order to improve the following deficits and impairments:  Impaired perceived functional ability, Impaired UE functional use, Pain  Visit Diagnosis: 1. Chronic right shoulder pain   2. Cervicalgia        Problem List Patient Active Problem List   Diagnosis Date Noted  . Postoperative history of checked last year 04/22/2017  . Weakness of left arm 04/22/2017  . Numbness and tingling in left arm 04/22/2017  . Lower back pain 04/02/2016  . Acute stress disorder 02/25/2015  . Anxiety 02/25/2015  . Airway hyperreactivity 02/25/2015  . Bradycardia 02/25/2015  . Hypersomnia 02/25/2015  . Clinical depression 02/25/2015  . Elevated blood sugar 02/25/2015  . 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 Acid reflux 02/25/2015  . 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 H/O disease 02/25/2015  . Hypercholesteremia 02/25/2015  . Headache, migraine 02/25/2015  . Muscle ache 02/25/2015  . Allergic rhinitis, seasonal 02/25/2015  . Avitaminosis D 02/25/2015   Phillips Grout PT, DPT, GCS  Rachel Fry 04/19/2019, 11:32 AM  [place] MAIN [facility]  SERVICES [address] [place], [place], [address] Phone: [phone]   Fax:  [phone]  Name: Rachel Fry MRN: [id number] Date of Birth: Dec 28, 1955

## 2019-04-24 ENCOUNTER — Ambulatory Visit: Payer: 59

## 2019-04-26 MED FILL — EMGALITY 120 MG/ML SOAJ: 120 | 30 days supply | Qty: 1 | Fill #3

## 2019-04-26 MED FILL — predniSONE 5 MG TABS: 5 | 30 days supply | Qty: 30 | Fill #3

## 2019-04-26 MED FILL — VIT D2 1.25 MG (50,000 UNIT: 1.25 MG | 28 days supply | Qty: 4 | Fill #0

## 2019-04-26 MED FILL — SPIRONOLACTONE 25 MG TABS: 25 | 90 days supply | Qty: 90 | Fill #0

## 2019-05-01 ENCOUNTER — Other Ambulatory Visit: Payer: Self-pay

## 2019-05-01 ENCOUNTER — Ambulatory Visit: Payer: 59 | Attending: Internal Medicine

## 2019-05-01 DIAGNOSIS — G8929 Other chronic pain: Secondary | ICD-10-CM | POA: Insufficient documentation

## 2019-05-01 DIAGNOSIS — M25511 Pain in right shoulder: Secondary | ICD-10-CM | POA: Insufficient documentation

## 2019-05-01 DIAGNOSIS — M542 Cervicalgia: Secondary | ICD-10-CM | POA: Diagnosis not present

## 2019-05-01 NOTE — Therapy (Signed)
[place] MAIN [facility] SERVICES [address] [place], [place], [address] Phone: [phone]   Fax:  [phone]  Physical Therapy Treatment  Patient Details  Name: Rachel Fry MRN: [id number] Date of Birth: [birth year] Referring Provider (PT): Dr. Karlton Lemon   Encounter Date: 05/01/2019  PT End of Session - 05/02/19 1249    Visit Number  3    Number of Visits  25    Date for PT Re-Evaluation  07/10/19    PT Start Time  1648    PT Stop Time  1730    PT Time Calculation (min)  42 min    Activity Tolerance  Patient tolerated treatment well    Behavior During Therapy  [facility] for tasks assessed/performed       Past Medical History:  Diagnosis Date  . Allergy   . Migraines   . Numbness and tingling    left side of body, occasionally right side   . S/P Botox injection     Past Surgical History:  Procedure Laterality Date  . ARM NEUROPLASTY Right 1994 and 1996   for chronic pain  . CESAREAN SECTION    . OVARIAN CYST REMOVAL  1973  . TONSILLECTOMY AND ADENOIDECTOMY    . TUBAL LIGATION  1988    There were no vitals filed for this visit.  Subjective Assessment - 05/01/19 1649    Subjective  Pt reports that she is doing alright today. She complains of 2/10 R sided neck pain upon arrival. She is having trigger point injections tomorrow. She reports that she felt better after last session but her symptoms returned. She also reports that her migraine pattern has been changing since she started emgality. Otherwise no specific questions or concerns currently.    Pertinent History  Pt fractured her R radius in ulna in 1993. She underwent a closed reduction with casting. Following removal of the cast she was unable to move her R wrist. She had R wrist surgery the following year and states that they were able to move her wrist under anesthesia however when awake she was still unable to move her wrist. She underwent a repeat R wrist surgery the following  year followed by 1 year of physical therapy. She states that she had a NCV and multiple testing which didn't reveal any issues. She believes that the pain in her RUE was originating from her neck. She started seeking multiple treatments in 2012 including chiropractic, dry needling, acupuncture, myofascial release for her widespread chronic pain. She states that she saw some improvement at that time. Approximately 2 years ago she started having heaviness and tingling in the L arm and L leg. She states that they have done an MRI of her brain, neck, and thoracic spine without any notable findings or explanations. They have also done NCV studies for both sides without any significant findings. She has had extensive testing without any significant findings to explain her symptoms. She states that the RLE is relatively unaffected. Prior to coming to therapy she was seeing a massage therapist for myofascial release and seeing chiropractor for dry needling and adjustments. She also sees another massage therapist for general massage for head, neck, and back. She has a history of chronic migraines for at least 10 years. She reports that her migraines are L temporal and she has started having some L orbital pain more recently as well with her migraines. She reports that her migraines are constant and are always present generally "at  a low level." She also gets nausea and upset stomach during the migraines and feels like her balance is off. She saw ENT and states that they did vestibular testing with a deficit identified in her R ear. She reports that she worked with vestibular therapy and upon retesting it has "improved which surprised the ENT." Her neurologist believes that the rest of her symptoms in her BUE/LLE are a manifestation of migraines. She receives botox injections every 12 weeks for the migraines by her neurologist. She also gets trigger point injections every 4 weeks as well. Most of the focus from her  chiropractor is R upper quadrant (shoulder, R side of neck). Pt does have occasional L shoulder pain.    Diagnostic tests  negative MRI for cervical, thoracic, brain    Patient Stated Goals  Decrease R shoulder/arm/neck pain    Currently in Pain?  Yes    Pain Score  2     Pain Location  Shoulder    Pain Orientation  Right    Pain Descriptors / Indicators  Sharp    Pain Type  Chronic pain    Pain Onset  More than a month ago          TREATMENT   Manual Therapy  Supine gentle cervical manual traction 15s hold, 15s rest x 3 bouts; Gentle cervical PROM rotation; R upper trap stretch 30s hold x 2; R levator scapulae stretch x 30s; R first rib mobilizations, grade I, 30s/bout x 1 bouts; R median nerve glides x 10; C2-C7 CPA mobilizations, grade I, 30s/bout x 1 bout/level; Gentle STM to R upper and mid trap, levator scapulae. Effleurage, ptrissage, and trigger point release utilized; Supine repeated cervical retraction with gentle overpressure x 10;   Pt educated throughout session about proper posture and technique with exercises. Improved exercise technique, movement at target joints, use of target muscles after min to mod verbal, visual, tactile cues.    Pt reports slight improvement in pain following last session. Deferred dry needling today as pt has trigger point injections scheduled for tomorrow. She reports improvement in her pain at the end of the session but does not rate. Will need to establish an HEP once appropriate as well as discuss a graded aerobic exercise program. Pt will benefit from PT services to address deficits in pain in order to return to full function at home and work with less pain.                        PT Short Term Goals - 04/18/19 1527      PT SHORT TERM GOAL #1   Title  Pt will be independent with HEP in order to improve RUE use and decrease pain in order to improve pain-free function at home and work.    Time  6     Period  Weeks    Status  New    Target Date  05/29/19        PT Long Term Goals - 04/19/19 1130      PT LONG TERM GOAL #1   Title  Pt will decrease quick DASH score by at least 8% in order to demonstrate clinically significant reduction in disability.    Baseline  04/17/19: To complete at next visit; 04/19/19: 50%    Time  12    Period  Weeks    Status  New    Target Date  07/10/19      PT LONG  TERM GOAL #2   Title  Pt will decrease worst pain as reported on NPRS by at least 3 points in order to demonstrate clinically significant reduction in pain.    Baseline  04/17/19: Worst: 6/10    Time  12    Period  Weeks    Status  New    Target Date  07/10/19      PT LONG TERM GOAL #3   Title  Pt will demonstrate pain-free cervical motion in order to perform IADLs such as driving and household chores without increase in R cervical/R shoulder pain    Baseline  04/17/19: Pain with R cervical rotation, bilateral lateral flexion, and extension    Time  12    Period  Weeks    Status  New    Target Date  07/10/19      PT LONG TERM GOAL #4   Title  Pt will demonstrate decrease in NDI by at least 19% in order to demonstrate clinically significant reduction in disability related to neck injury/pain    Baseline  04/19/19: 30%    Time  12    Period  Weeks    Status  New    Target Date  07/10/19            Plan - 05/02/19 1250    Clinical Impression Statement  Pt reports slight improvement in pain following last session. Deferred dry needling today as pt has trigger point injections scheduled for tomorrow. She reports improvement in her pain at the end of the session but does not rate. Will need to establish an HEP once appropriate as well as discuss a graded aerobic exercise program. Pt will benefit from PT services to address deficits in pain in order to return to full function at home and work with less pain.    Personal Factors and Comorbidities  Age;Behavior Pattern;Comorbidity  1;Comorbidity 2;Comorbidity 3+;Fitness;Past/Current Experience;Profession;Time since onset of injury/illness/exacerbation    Comorbidities  Migraines, depression, anxiety, widespread chronic pain    Examination-Activity Limitations  Caring for Others;Carry;Lift;Sleep    Examination-Participation Restrictions  Cleaning;Community Activity;Interpersonal Relationship    Stability/Clinical Decision Making  Unstable/Unpredictable    Rehab Potential  Fair    Clinical Impairments Affecting Rehab Potential  chronic pain, motivation, multiple treatment modalities    PT Frequency  2x / week    PT Duration  12 weeks    PT Treatment/Interventions  ADLs/Self Care Home Management;Dry needling;Manual techniques;Therapeutic exercise;Therapeutic activities;Neuromuscular re-education;Aquatic Therapy;Biofeedback;Canalith Repostioning;Cryotherapy;Electrical Stimulation;Iontophoresis 4mg /ml Dexamethasone;Moist Heat;Ultrasound;Patient/family education;Passive range of motion;Energy conservation;Vestibular;Joint Manipulations    PT Next Visit Plan  Initiate HEP once appropriate, perform trigger point dry needling and manual techniques until pt can tolerate active strengthening, provide education about insomnia and its relationship to chronic pain, additional chronic pain education    PT Home Exercise Plan  None currently    Consulted and Agree with Plan of Care  Patient       Patient will benefit from skilled therapeutic intervention in order to improve the following deficits and impairments:  Impaired perceived functional ability, Impaired UE functional use, Pain  Visit Diagnosis: 1. Chronic right shoulder pain   2. Cervicalgia        Problem List Patient Active Problem List   Diagnosis Date Noted  . Postoperative history of checked last year 04/22/2017  . Weakness of left arm 04/22/2017  . Numbness and tingling in left arm 04/22/2017  . Lower back pain 04/02/2016  . Acute stress disorder 02/25/2015  .  Anxiety  02/25/2015  . Airway hyperreactivity 02/25/2015  . Bradycardia 02/25/2015  . Hypersomnia 02/25/2015  . Clinical depression 02/25/2015  . Elevated blood sugar 02/25/2015  . 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 Acid reflux 02/25/2015  . 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 H/O disease 02/25/2015  . Hypercholesteremia 02/25/2015  . Headache, migraine 02/25/2015  . Muscle ache 02/25/2015  . Allergic rhinitis, seasonal 02/25/2015  . Avitaminosis D 02/25/2015   Phillips Grout PT, DPT, GCS  Huprich,Jason 05/02/2019, 12:54 PM  [place] MAIN [facility] SERVICES [address] [place], [place], [address] Phone: [phone]   Fax:  [phone]  Name: Rachel Fry MRN: [id number] Date of Birth: [birth year]

## 2019-05-02 DIAGNOSIS — M542 Cervicalgia: Secondary | ICD-10-CM | POA: Diagnosis not present

## 2019-05-02 DIAGNOSIS — M791 Myalgia, unspecified site: Secondary | ICD-10-CM | POA: Diagnosis not present

## 2019-05-02 MED FILL — FLOVENT HFA 110 MCG INHALER: 110 | 30 days supply | Qty: 12 | Fill #3

## 2019-05-02 MED FILL — ZAFIRLUKAST 10 MG TAB: 10 | 90 days supply | Qty: 180 | Fill #1

## 2019-05-04 ENCOUNTER — Other Ambulatory Visit: Payer: Self-pay

## 2019-05-04 ENCOUNTER — Ambulatory Visit: Payer: 59

## 2019-05-04 DIAGNOSIS — G8929 Other chronic pain: Secondary | ICD-10-CM

## 2019-05-04 DIAGNOSIS — M25511 Pain in right shoulder: Secondary | ICD-10-CM | POA: Diagnosis not present

## 2019-05-04 DIAGNOSIS — M542 Cervicalgia: Secondary | ICD-10-CM | POA: Diagnosis not present

## 2019-05-04 NOTE — Therapy (Signed)
[place] MAIN [facility] SERVICES [address] [place], [place], [address] Phone: [phone]   Fax:  [phone]  Physical Therapy Treatment  Patient Details  Name: Rachel Fry MRN: [id number] Date of Birth: [birth year] Referring Provider (PT): Dr. Karlton Lemon   Encounter Date: 05/04/2019  PT End of Session - 05/04/19 2130    Visit Number  4    Number of Visits  25    Date for PT Re-Evaluation  07/10/19    PT Start Time  4259    PT Stop Time  1731    PT Time Calculation (min)  41 min    Activity Tolerance  Patient tolerated treatment well    Behavior During Therapy  [facility] for tasks assessed/performed       Past Medical History:  Diagnosis Date  . Allergy   . Migraines   . Numbness and tingling    left side of body, occasionally right side   . S/P Botox injection     Past Surgical History:  Procedure Laterality Date  . ARM NEUROPLASTY Right 1994 and 1996   for chronic pain  . CESAREAN SECTION    . OVARIAN CYST REMOVAL  1973  . TONSILLECTOMY AND ADENOIDECTOMY    . TUBAL LIGATION  1988    There were no vitals filed for this visit.  Subjective Assessment - 05/04/19 2128    Subjective  Pt reports that she is doing alright today. She complains of 2/10 R sided neck pain upon arrival. She had her trigger point injections yesterday and believes they have been helpful. Per patient more focus was directed toward the L upper quarter. No specific questions or concerns currently.    Pertinent History  Pt fractured her R radius in ulna in 1993. She underwent a closed reduction with casting. Following removal of the cast she was unable to move her R wrist. She had R wrist surgery the following year and states that they were able to move her wrist under anesthesia however when awake she was still unable to move her wrist. She underwent a repeat R wrist surgery the following year followed by 1 year of physical therapy. She states that she had a NCV and  multiple testing which didn't reveal any issues. She believes that the pain in her RUE was originating from her neck. She started seeking multiple treatments in 2012 including chiropractic, dry needling, acupuncture, myofascial release for her widespread chronic pain. She states that she saw some improvement at that time. Approximately 2 years ago she started having heaviness and tingling in the L arm and L leg. She states that they have done an MRI of her brain, neck, and thoracic spine without any notable findings or explanations. They have also done NCV studies for both sides without any significant findings. She has had extensive testing without any significant findings to explain her symptoms. She states that the RLE is relatively unaffected. Prior to coming to therapy she was seeing a massage therapist for myofascial release and seeing chiropractor for dry needling and adjustments. She also sees another massage therapist for general massage for head, neck, and back. She has a history of chronic migraines for at least 10 years. She reports that her migraines are L temporal and she has started having some L orbital pain more recently as well with her migraines. She reports that her migraines are constant and are always present generally "at a low level." She also gets nausea and upset stomach during  the migraines and feels like her balance is off. She saw ENT and states that they did vestibular testing with a deficit identified in her R ear. She reports that she worked with vestibular therapy and upon retesting it has "improved which surprised the ENT." Her neurologist believes that the rest of her symptoms in her BUE/LLE are a manifestation of migraines. She receives botox injections every 12 weeks for the migraines by her neurologist. She also gets trigger point injections every 4 weeks as well. Most of the focus from her chiropractor is R upper quadrant (shoulder, R side of neck). Pt does have occasional L  shoulder pain.    Diagnostic tests  negative MRI for cervical, thoracic, brain    Patient Stated Goals  Decrease R shoulder/arm/neck pain    Currently in Pain?  Yes    Pain Score  2     Pain Location  Shoulder    Pain Orientation  Right    Pain Descriptors / Indicators  Sharp    Pain Type  Chronic pain    Pain Onset  More than a month ago          TREATMENT   Manual Therapy Supine gentle cervical manual traction 15s hold, 15s rest x 4 bouts; Suboccipital release x 30s; Gentle cervical PROM rotation; Upper trap stretch 30s hold x 2 bilaterally Levator scapulae stretch 30s x 2 bilaterally; R first rib mobilizations, grade I, 30s/bout x 2 bouts; R median nerve glides x 10; C2-C7 CPA mobilizations, grade I, 30s/bout x 1 bout/level; Gentle STM to upper and levator scapulae bilaterally. Effleurage, ptrissage, and trigger point release utilized. Utilized AROM cervical rotation during trigger point release of upper traps bilaterally; Issued HEP with stretches;   Pt educated throughout session about proper posture and technique with exercises. Improved exercise technique, movement at target joints, use of target muscles after min to mod verbal, visual, tactile cues.   Deferred dry needling today as pt had trigger point injections yesterday. She denies any increase in pain with manual techniques today. Pt issued upper trap, levator, and anterior scalene stretches for HEP.Will continue to progress manual techniques and move toward graded exercises as pt tolerates. She will benefit from PT services to address deficits inpainin order to return to full function at home and work with less pain.                       PT Short Term Goals - 04/18/19 1527      PT SHORT TERM GOAL #1   Title  Pt will be independent with HEP in order to improve RUE use and decrease pain in order to improve pain-free function at home and work.    Time  6    Period  Weeks     Status  New    Target Date  05/29/19        PT Long Term Goals - 04/19/19 1130      PT LONG TERM GOAL #1   Title  Pt will decrease quick DASH score by at least 8% in order to demonstrate clinically significant reduction in disability.    Baseline  04/17/19: To complete at next visit; 04/19/19: 50%    Time  12    Period  Weeks    Status  New    Target Date  07/10/19      PT LONG TERM GOAL #2   Title  Pt will decrease worst pain as reported on NPRS  by at least 3 points in order to demonstrate clinically significant reduction in pain.    Baseline  04/17/19: Worst: 6/10    Time  12    Period  Weeks    Status  New    Target Date  07/10/19      PT LONG TERM GOAL #3   Title  Pt will demonstrate pain-free cervical motion in order to perform IADLs such as driving and household chores without increase in R cervical/R shoulder pain    Baseline  04/17/19: Pain with R cervical rotation, bilateral lateral flexion, and extension    Time  12    Period  Weeks    Status  New    Target Date  07/10/19      PT LONG TERM GOAL #4   Title  Pt will demonstrate decrease in NDI by at least 19% in order to demonstrate clinically significant reduction in disability related to neck injury/pain    Baseline  04/19/19: 30%    Time  12    Period  Weeks    Status  New    Target Date  07/10/19            Plan - 05/04/19 2131    Clinical Impression Statement  Deferred dry needling today as pt had trigger point injections yesterday. She denies any increase in pain with manual techniques today. Pt issued upper trap, levator, and anterior scalene stretches for HEP. Will continue to progress manual techniques and move toward graded exercises as pt tolerates. She will benefit from PT services to address deficits in pain in order to return to full function at home and work with less pain.    Personal Factors and Comorbidities  Age;Behavior Pattern;Comorbidity 1;Comorbidity 2;Comorbidity 3+;Fitness;Past/Current  Experience;Profession;Time since onset of injury/illness/exacerbation    Comorbidities  Migraines, depression, anxiety, widespread chronic pain    Examination-Activity Limitations  Caring for Others;Carry;Lift;Sleep    Examination-Participation Restrictions  Cleaning;Community Activity;Interpersonal Relationship    Stability/Clinical Decision Making  Unstable/Unpredictable    Rehab Potential  Fair    Clinical Impairments Affecting Rehab Potential  chronic pain, motivation, multiple treatment modalities    PT Frequency  2x / week    PT Duration  12 weeks    PT Treatment/Interventions  ADLs/Self Care Home Management;Dry needling;Manual techniques;Therapeutic exercise;Therapeutic activities;Neuromuscular re-education;Aquatic Therapy;Biofeedback;Canalith Repostioning;Cryotherapy;Electrical Stimulation;Iontophoresis 4mg /ml Dexamethasone;Moist Heat;Ultrasound;Patient/family education;Passive range of motion;Energy conservation;Vestibular;Joint Manipulations    PT Next Visit Plan  Initiate HEP once appropriate, perform trigger point dry needling and manual techniques until pt can tolerate active strengthening, provide education about insomnia and its relationship to chronic pain, additional chronic pain education    PT Home Exercise Plan  Upper trap, levator, and anterior scalene stretches bilaterally    Consulted and Agree with Plan of Care  Patient       Patient will benefit from skilled therapeutic intervention in order to improve the following deficits and impairments:  Impaired perceived functional ability, Impaired UE functional use, Pain  Visit Diagnosis: 1. Chronic right shoulder pain   2. Cervicalgia        Problem List Patient Active Problem List   Diagnosis Date Noted  . Postoperative history of checked last year 04/22/2017  . Weakness of left arm 04/22/2017  . Numbness and tingling in left arm 04/22/2017  . Lower back pain 04/02/2016  . Acute stress disorder 02/25/2015  .  Anxiety 02/25/2015  . Airway hyperreactivity 02/25/2015  . Bradycardia 02/25/2015  . Hypersomnia 02/25/2015  . Clinical depression 02/25/2015  .  Elevated blood sugar 02/25/2015  . 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 Acid reflux 02/25/2015  . 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 H/O disease 02/25/2015  . Hypercholesteremia 02/25/2015  . Headache, migraine 02/25/2015  . Muscle ache 02/25/2015  . Allergic rhinitis, seasonal 02/25/2015  . Avitaminosis D 02/25/2015   Phillips Grout PT, DPT, GCS  Kennidi Yoshida 05/04/2019, 9:36 PM  [place] MAIN [facility] SERVICES [address] [place], [place], [address] Phone: [phone]   Fax:  [phone]  Name: NELI FOFANA MRN: [id number] Date of Birth: [birth year]

## 2019-05-08 MED FILL — LOSARTAN POTASSIUM 25 MG TA: 25 | 30 days supply | Qty: 30 | Fill #0

## 2019-05-09 ENCOUNTER — Other Ambulatory Visit: Payer: Self-pay

## 2019-05-09 ENCOUNTER — Ambulatory Visit: Payer: 59

## 2019-05-09 DIAGNOSIS — G8929 Other chronic pain: Secondary | ICD-10-CM

## 2019-05-09 DIAGNOSIS — M25511 Pain in right shoulder: Secondary | ICD-10-CM | POA: Diagnosis not present

## 2019-05-09 DIAGNOSIS — M542 Cervicalgia: Secondary | ICD-10-CM

## 2019-05-10 NOTE — Therapy (Signed)
[place] MAIN [facility] SERVICES [address] [place], [place], [address] Phone: [phone]   Fax:  [phone]  Physical Therapy Treatment  Patient Details  Name: Rachel Fry MRN: [id number] Date of Birth: [birth year] Referring Provider (PT): Dr. Karlton Lemon   Encounter Date: 05/09/2019  PT End of Session - 05/10/19 1145    Visit Number  5    Number of Visits  25    Date for PT Re-Evaluation  07/10/19    PT Start Time  1550    PT Stop Time  1630    PT Time Calculation (min)  40 min    Activity Tolerance  Patient tolerated treatment well    Behavior During Therapy  [facility] for tasks assessed/performed       Past Medical History:  Diagnosis Date  . Allergy   . Migraines   . Numbness and tingling    left side of body, occasionally right side   . S/P Botox injection     Past Surgical History:  Procedure Laterality Date  . ARM NEUROPLASTY Right 1994 and 1996   for chronic pain  . CESAREAN SECTION    . OVARIAN CYST REMOVAL  1973  . TONSILLECTOMY AND ADENOIDECTOMY    . TUBAL LIGATION  1988    There were no vitals filed for this visit.  Subjective Assessment - 05/10/19 1140    Subjective  Pt reports that she is doing alright today. She complains of 2/10 R sided neck soreness upon arrival. She has been able to perform some stretches since her last session. No specific questions or concerns currently.    Pertinent History  Pt fractured her R radius in ulna in 1993. She underwent a closed reduction with casting. Following removal of the cast she was unable to move her R wrist. She had R wrist surgery the following year and states that they were able to move her wrist under anesthesia however when awake she was still unable to move her wrist. She underwent a repeat R wrist surgery the following year followed by 1 year of physical therapy. She states that she had a NCV and multiple testing which didn't reveal any issues. She believes that the pain  in her RUE was originating from her neck. She started seeking multiple treatments in 2012 including chiropractic, dry needling, acupuncture, myofascial release for her widespread chronic pain. She states that she saw some improvement at that time. Approximately 2 years ago she started having heaviness and tingling in the L arm and L leg. She states that they have done an MRI of her brain, neck, and thoracic spine without any notable findings or explanations. They have also done NCV studies for both sides without any significant findings. She has had extensive testing without any significant findings to explain her symptoms. She states that the RLE is relatively unaffected. Prior to coming to therapy she was seeing a massage therapist for myofascial release and seeing chiropractor for dry needling and adjustments. She also sees another massage therapist for general massage for head, neck, and back. She has a history of chronic migraines for at least 10 years. She reports that her migraines are L temporal and she has started having some L orbital pain more recently as well with her migraines. She reports that her migraines are constant and are always present generally "at a low level." She also gets nausea and upset stomach during the migraines and feels like her balance is off. She saw ENT  and states that they did vestibular testing with a deficit identified in her R ear. She reports that she worked with vestibular therapy and upon retesting it has "improved which surprised the ENT." Her neurologist believes that the rest of her symptoms in her BUE/LLE are a manifestation of migraines. She receives botox injections every 12 weeks for the migraines by her neurologist. She also gets trigger point injections every 4 weeks as well. Most of the focus from her chiropractor is R upper quadrant (shoulder, R side of neck). Pt does have occasional L shoulder pain.    Diagnostic tests  negative MRI for cervical, thoracic,  brain    Patient Stated Goals  Decrease R shoulder/arm/neck pain    Currently in Pain?  Yes    Pain Score  2     Pain Location  Shoulder    Pain Orientation  Right    Pain Descriptors / Indicators  Sore    Pain Type  Chronic pain    Pain Onset  More than a month ago    Pain Frequency  Constant           TREATMENT   Manual Therapy Supine gentle cervical manual traction 15s hold, 15s rest x 4 bouts; Gentle cervical PROM rotation; Upper trap stretch 30s hold x 2 bilaterally Levator scapulae stretch 30s x 2 bilaterally; First rib mobilizations bilaterally, grade I, 30s/bout x 2 bouts; Median nerve glides x 10 bilateral; C2-C7 CPA mobilizations, grade I, 30s/bout x 1 bout/level; GentleSTMto R upper trap and levator scapulae. Effleurage, ptrissage, and trigger point release utilized.    Trigger Point Dry Needling (TDN), unbilled Education performed with patient regarding potential benefit of TDN. Reviewed precautions and risks with patient. Pt provided verbal consent to treatment. TDN performed to R SCM with 2,  0.3 x 50 single needle placements as well as 3, 0.3 x 50 single needle placements to R upper trap. Local twitch response (LTR) during 2 of 5 placement. Pistoning technique utilized.    Pt educated throughout session about proper posture and technique with exercises. Improved exercise technique, movement at target joints, use of target muscles after min to mod verbal, visual, tactile cues.   Pt denies any increase in pain with manual techniques today. Reviewed stretches issued at last session. Pt is less guarded today during stretches and mobilizations. Performed TDN to R upper trap as well as R SCM. Pt reports reproduction of R ear pain with one needle in SCM as well as one in upper trap.Will continue to progress manual techniques and move toward graded exercises as pt tolerates. She will benefit from PT services to address deficits inpainin order to return to  full function at home and work with less pain.                      PT Short Term Goals - 04/18/19 1527      PT SHORT TERM GOAL #1   Title  Pt will be independent with HEP in order to improve RUE use and decrease pain in order to improve pain-free function at home and work.    Time  6    Period  Weeks    Status  New    Target Date  05/29/19        PT Long Term Goals - 04/19/19 1130      PT LONG TERM GOAL #1   Title  Pt will decrease quick DASH score by at least 8% in order  to demonstrate clinically significant reduction in disability.    Baseline  04/17/19: To complete at next visit; 04/19/19: 50%    Time  12    Period  Weeks    Status  New    Target Date  07/10/19      PT LONG TERM GOAL #2   Title  Pt will decrease worst pain as reported on NPRS by at least 3 points in order to demonstrate clinically significant reduction in pain.    Baseline  04/17/19: Worst: 6/10    Time  12    Period  Weeks    Status  New    Target Date  07/10/19      PT LONG TERM GOAL #3   Title  Pt will demonstrate pain-free cervical motion in order to perform IADLs such as driving and household chores without increase in R cervical/R shoulder pain    Baseline  04/17/19: Pain with R cervical rotation, bilateral lateral flexion, and extension    Time  12    Period  Weeks    Status  New    Target Date  07/10/19      PT LONG TERM GOAL #4   Title  Pt will demonstrate decrease in NDI by at least 19% in order to demonstrate clinically significant reduction in disability related to neck injury/pain    Baseline  04/19/19: 30%    Time  12    Period  Weeks    Status  New    Target Date  07/10/19            Plan - 05/10/19 1146    Clinical Impression Statement  Pt denies any increase in pain with manual techniques today. Reviewed stretches issued at last session. Pt is less guarded today during stretches and mobilizations. Performed TDN to R upper trap as well as R SCM. Pt reports  reproduction of R ear pain with one needle in SCM as well as one in upper trap. Will continue to progress manual techniques and move toward graded exercises as pt tolerates. She will benefit from PT services to address deficits in pain in order to return to full function at home and work with less pain.    Personal Factors and Comorbidities  Age;Behavior Pattern;Comorbidity 1;Comorbidity 2;Comorbidity 3+;Fitness;Past/Current Experience;Profession;Time since onset of injury/illness/exacerbation    Comorbidities  Migraines, depression, anxiety, widespread chronic pain    Examination-Activity Limitations  Caring for Others;Carry;Lift;Sleep    Examination-Participation Restrictions  Cleaning;Community Activity;Interpersonal Relationship    Stability/Clinical Decision Making  Unstable/Unpredictable    Rehab Potential  Fair    Clinical Impairments Affecting Rehab Potential  chronic pain, motivation, multiple treatment modalities    PT Frequency  2x / week    PT Duration  12 weeks    PT Treatment/Interventions  ADLs/Self Care Home Management;Dry needling;Manual techniques;Therapeutic exercise;Therapeutic activities;Neuromuscular re-education;Aquatic Therapy;Biofeedback;Canalith Repostioning;Cryotherapy;Electrical Stimulation;Iontophoresis 4mg /ml Dexamethasone;Moist Heat;Ultrasound;Patient/family education;Passive range of motion;Energy conservation;Vestibular;Joint Manipulations    PT Next Visit Plan  Perform trigger point dry needling and manual techniques until pt can tolerate active strengthening, provide education about insomnia and its relationship to chronic pain, additional chronic pain education    PT Home Exercise Plan  Upper trap, levator, and anterior scalene stretches bilaterally    Consulted and Agree with Plan of Care  Patient       Patient will benefit from skilled therapeutic intervention in order to improve the following deficits and impairments:  Impaired perceived functional ability,  Impaired UE functional use, Pain  Visit  Diagnosis: 1. Chronic right shoulder pain   2. Cervicalgia        Problem List Patient Active Problem List   Diagnosis Date Noted  . Postoperative history of checked last year 04/22/2017  . Weakness of left arm 04/22/2017  . Numbness and tingling in left arm 04/22/2017  . Lower back pain 04/02/2016  . Acute stress disorder 02/25/2015  . Anxiety 02/25/2015  . Airway hyperreactivity 02/25/2015  . Bradycardia 02/25/2015  . Hypersomnia 02/25/2015  . Clinical depression 02/25/2015  . Elevated blood sugar 02/25/2015  . 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 Acid reflux 02/25/2015  . 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 H/O disease 02/25/2015  . Hypercholesteremia 02/25/2015  . Headache, migraine 02/25/2015  . Muscle ache 02/25/2015  . Allergic rhinitis, seasonal 02/25/2015  . Avitaminosis D 02/25/2015   Phillips Grout PT, DPT, GCS  Donaciano Range 05/10/2019, 1:26 PM  [place] MAIN [facility] SERVICES [address] [place], [place], [address] Phone: [phone]   Fax:  [phone]  Name: Rachel Fry MRN: [id number] Date of Birth: 1955/12/20

## 2019-05-11 ENCOUNTER — Ambulatory Visit: Payer: 59

## 2019-05-12 MED FILL — BOTOX 200 UNITS VIAL: 200 | 84 days supply | Qty: 1 | Fill #0

## 2019-05-15 MED FILL — RIZATRIPTAN BENZOATE 10 MG: 10 | 20 days supply | Qty: 12 | Fill #1

## 2019-05-15 MED FILL — FROVATRIPTAN SUCC 2.5 MG TA: 2.5 | 30 days supply | Qty: 18 | Fill #1

## 2019-05-15 MED FILL — NURTEC 75 MG TBDP: 75 | 30 days supply | Qty: 8 | Fill #2

## 2019-05-16 ENCOUNTER — Ambulatory Visit: Payer: 59

## 2019-05-16 ENCOUNTER — Other Ambulatory Visit: Payer: Self-pay

## 2019-05-16 DIAGNOSIS — G8929 Other chronic pain: Secondary | ICD-10-CM | POA: Diagnosis not present

## 2019-05-16 DIAGNOSIS — M542 Cervicalgia: Secondary | ICD-10-CM | POA: Diagnosis not present

## 2019-05-16 DIAGNOSIS — M25511 Pain in right shoulder: Secondary | ICD-10-CM | POA: Diagnosis not present

## 2019-05-16 NOTE — Therapy (Signed)
[place] MAIN [facility] SERVICES [address] [place], [place], [address] Phone: [phone]   Fax:  [phone]  Physical Therapy Treatment  Patient Details  Name: Rachel Fry MRN: [id number] Date of Birth: 27-Sep-1956 Referring Provider (PT): Dr. Karlton Lemon   Encounter Date: 05/16/2019  PT End of Session - 05/16/19 1658    Visit Number  6    Number of Visits  25    Date for PT Re-Evaluation  07/10/19    Authorization Type  eval 04/17/19    PT Start Time  1610    PT Stop Time  1645    PT Time Calculation (min)  35 min    Activity Tolerance  Patient tolerated treatment well    Behavior During Therapy  [facility] for tasks assessed/performed       Past Medical History:  Diagnosis Date  . Allergy   . Migraines   . Numbness and tingling    left side of body, occasionally right side   . S/P Botox injection     Past Surgical History:  Procedure Laterality Date  . ARM NEUROPLASTY Right 1994 and 1996   for chronic pain  . CESAREAN SECTION    . OVARIAN CYST REMOVAL  1973  . TONSILLECTOMY AND ADENOIDECTOMY    . TUBAL LIGATION  1988    There were no vitals filed for this visit.  Subjective Assessment - 05/16/19 1654    Subjective  Pt reports that she is doing alright today. She complains of 1/10 R shoulder soreness upon arrival. No specific questions or concerns currently.    Pertinent History  Pt fractured her R radius in ulna in 1993. She underwent a closed reduction with casting. Following removal of the cast she was unable to move her R wrist. She had R wrist surgery the following year and states that they were able to move her wrist under anesthesia however when awake she was still unable to move her wrist. She underwent a repeat R wrist surgery the following year followed by 1 year of physical therapy. She states that she had a NCV and multiple testing which didn't reveal any issues. She believes that the pain in her RUE was originating from  her neck. She started seeking multiple treatments in 2012 including chiropractic, dry needling, acupuncture, myofascial release for her widespread chronic pain. She states that she saw some improvement at that time. Approximately 2 years ago she started having heaviness and tingling in the L arm and L leg. She states that they have done an MRI of her brain, neck, and thoracic spine without any notable findings or explanations. They have also done NCV studies for both sides without any significant findings. She has had extensive testing without any significant findings to explain her symptoms. She states that the RLE is relatively unaffected. Prior to coming to therapy she was seeing a massage therapist for myofascial release and seeing chiropractor for dry needling and adjustments. She also sees another massage therapist for general massage for head, neck, and back. She has a history of chronic migraines for at least 10 years. She reports that her migraines are L temporal and she has started having some L orbital pain more recently as well with her migraines. She reports that her migraines are constant and are always present generally "at a low level." She also gets nausea and upset stomach during the migraines and feels like her balance is off. She saw ENT and states that they did  vestibular testing with a deficit identified in her R ear. She reports that she worked with vestibular therapy and upon retesting it has "improved which surprised the ENT." Her neurologist believes that the rest of her symptoms in her BUE/LLE are a manifestation of migraines. She receives botox injections every 12 weeks for the migraines by her neurologist. She also gets trigger point injections every 4 weeks as well. Most of the focus from her chiropractor is R upper quadrant (shoulder, R side of neck). Pt does have occasional L shoulder pain.    Diagnostic tests  negative MRI for cervical, thoracic, brain    Patient Stated Goals   Decrease R shoulder/arm/neck pain    Currently in Pain?  Yes    Pain Score  1     Pain Location  Shoulder    Pain Orientation  Right    Pain Descriptors / Indicators  Sore    Pain Type  Chronic pain    Pain Onset  More than a month ago    Pain Frequency  Constant          TREATMENT   Manual Therapy Palpation assessment to R extra and intraoral space to assess for TMJ related pain or reproduction of deep R ear pain. Pt is tender bilaterally, slightly more on R side, but no reproduction of deep ear pain. Palpation of R SCM also negative for reproduction of deep ear pain. PAM of R TMJ demonstrates normal mobility and no pain; No pain with palpation of R temporalis; Supine gentle cervical manual traction 15s hold, 15s rest x4bouts; Gentle cervical PROM rotation; Suboccipital release x 60s; Upper trap stretch 30s holdbilaterally Levator scapulae stretch 30sx 2 bilaterally; First rib mobilizations bilaterally, grade I, 30s/bout x1bouts each; C2-C6 CPA mobilizations, grade I, 30s/bout x 1 bout/level;   Trigger Point Dry Needling (TDN), unbilled Education performed with patient regarding potential benefit of TDN. Reviewed precautions and risks with patient. Pt provided verbal consent to treatment. TDN performed to R upper trap with 2, 0.3 x 50 single needle placements using anterior approach and 1 from posterior approach. Also performed 1, 0.3 x 50 single needle placement to R levator scapulae as well as one to R lateral pterygoid. Local twitch response (LTR) with 1 anterior needle placement and 1 posterior needle placement to R upper trap. LTR and reproduction of ear pain with placement to R lateral pterygoid. Pistoning technique utilized.    Pt educated throughout session about proper posture and technique with exercises. Improved exercise technique, movement at target joints, use of target muscles after min to mod verbal, visual, tactile cues.   Pt denies any increase  in pain with manual techniques today. TMJ assessment without notable restriction or pain. However she does report deep R ear pain with TDN to R lateral pterygoid. Performed TDN to R upper trap as well as R levator scapulae. Will continue to progress manual techniques and move toward graded exercises as pt tolerates. Shewill benefit from PT services to address deficits inpainin order to return to full function at home and work with less pain.                       PT Short Term Goals - 04/18/19 1527      PT SHORT TERM GOAL #1   Title  Pt will be independent with HEP in order to improve RUE use and decrease pain in order to improve pain-free function at home and work.    Time  6    Period  Weeks    Status  New    Target Date  05/29/19        PT Long Term Goals - 04/19/19 1130      PT LONG TERM GOAL #1   Title  Pt will decrease quick DASH score by at least 8% in order to demonstrate clinically significant reduction in disability.    Baseline  04/17/19: To complete at next visit; 04/19/19: 50%    Time  12    Period  Weeks    Status  New    Target Date  07/10/19      PT LONG TERM GOAL #2   Title  Pt will decrease worst pain as reported on NPRS by at least 3 points in order to demonstrate clinically significant reduction in pain.    Baseline  04/17/19: Worst: 6/10    Time  12    Period  Weeks    Status  New    Target Date  07/10/19      PT LONG TERM GOAL #3   Title  Pt will demonstrate pain-free cervical motion in order to perform IADLs such as driving and household chores without increase in R cervical/R shoulder pain    Baseline  04/17/19: Pain with R cervical rotation, bilateral lateral flexion, and extension    Time  12    Period  Weeks    Status  New    Target Date  07/10/19      PT LONG TERM GOAL #4   Title  Pt will demonstrate decrease in NDI by at least 19% in order to demonstrate clinically significant reduction in disability related to neck injury/pain     Baseline  04/19/19: 30%    Time  12    Period  Weeks    Status  New    Target Date  07/10/19            Plan - 05/16/19 1658    Clinical Impression Statement  Pt denies any increase in pain with manual techniques today. TMJ assessment without notable restriction or pain. However she does report deep R ear pain with TDN to R lateral pterygoid. Performed TDN to R upper trap as well as R levator scapulae. Will continue to progress manual techniques and move toward graded exercises as pt tolerates. She will benefit from PT services to address deficits in pain in order to return to full function at home and work with less pain.    Personal Factors and Comorbidities  Age;Behavior Pattern;Comorbidity 1;Comorbidity 2;Comorbidity 3+;Fitness;Past/Current Experience;Profession;Time since onset of injury/illness/exacerbation    Comorbidities  Migraines, depression, anxiety, widespread chronic pain    Examination-Activity Limitations  Caring for Others;Carry;Lift;Sleep    Examination-Participation Restrictions  Cleaning;Community Activity;Interpersonal Relationship    Stability/Clinical Decision Making  Unstable/Unpredictable    Rehab Potential  Fair    Clinical Impairments Affecting Rehab Potential  chronic pain, motivation, multiple treatment modalities    PT Frequency  2x / week    PT Duration  12 weeks    PT Treatment/Interventions  ADLs/Self Care Home Management;Dry needling;Manual techniques;Therapeutic exercise;Therapeutic activities;Neuromuscular re-education;Aquatic Therapy;Biofeedback;Canalith Repostioning;Cryotherapy;Electrical Stimulation;Iontophoresis 4mg /ml Dexamethasone;Moist Heat;Ultrasound;Patient/family education;Passive range of motion;Energy conservation;Vestibular;Joint Manipulations    PT Next Visit Plan  Perform trigger point dry needling and manual techniques until pt can tolerate active strengthening, provide education about insomnia and its relationship to chronic pain,  additional chronic pain education    PT Home Exercise Plan  Upper trap, levator, and anterior scalene  stretches bilaterally    Consulted and Agree with Plan of Care  Patient       Patient will benefit from skilled therapeutic intervention in order to improve the following deficits and impairments:  Impaired perceived functional ability, Impaired UE functional use, Pain  Visit Diagnosis: 1. Chronic right shoulder pain   2. Cervicalgia        Problem List Patient Active Problem List   Diagnosis Date Noted  . Postoperative history of checked last year 04/22/2017  . Weakness of left arm 04/22/2017  . Numbness and tingling in left arm 04/22/2017  . Lower back pain 04/02/2016  . Acute stress disorder 02/25/2015  . Anxiety 02/25/2015  . Airway hyperreactivity 02/25/2015  . Bradycardia 02/25/2015  . Hypersomnia 02/25/2015  . Clinical depression 02/25/2015  . Elevated blood sugar 02/25/2015  . 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 Acid reflux 02/25/2015  . 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 H/O disease 02/25/2015  . Hypercholesteremia 02/25/2015  . Headache, migraine 02/25/2015  . Muscle ache 02/25/2015  . Allergic rhinitis, seasonal 02/25/2015  . Avitaminosis D 02/25/2015   Phillips Grout PT, DPT, GCS  , 05/16/2019, 5:14 PM  [place] MAIN [facility] SERVICES [address] [place], [place], [address] Phone: [phone]   Fax:  [phone]  Name: Rachel Fry MRN: [id number] Date of Birth: 07-22-1956

## 2019-05-18 ENCOUNTER — Other Ambulatory Visit: Payer: Self-pay

## 2019-05-18 ENCOUNTER — Ambulatory Visit: Payer: 59

## 2019-05-18 DIAGNOSIS — G8929 Other chronic pain: Secondary | ICD-10-CM | POA: Diagnosis not present

## 2019-05-18 DIAGNOSIS — M25511 Pain in right shoulder: Secondary | ICD-10-CM | POA: Diagnosis not present

## 2019-05-18 DIAGNOSIS — M542 Cervicalgia: Secondary | ICD-10-CM

## 2019-05-18 MED FILL — VIT D2 1.25 MG (50,000 UNIT: 1.25 MG | 28 days supply | Qty: 4 | Fill #1

## 2019-05-18 NOTE — Therapy (Signed)
[place] MAIN [facility] SERVICES [place], [place], [address] Phone: [phone]   Fax:  [phone]  Physical Therapy Treatment/Goal Update  Patient Details  Name: Rachel Fry MRN: [id number] Date of Birth: 03/04/1956 Referring Provider (PT): Dr. Karlton Lemon   Encounter Date: 05/18/2019  PT End of Session - 05/18/19 1658    Visit Number  7    Number of Visits  25    Date for PT Re-Evaluation  07/10/19    Authorization Type  eval 04/17/19, goals updated 05/18/19    PT Start Time  1650    PT Stop Time  1730    PT Time Calculation (min)  40 min    Activity Tolerance  Patient tolerated treatment well    Behavior During Therapy  [facility] for tasks assessed/performed       Past Medical History:  Diagnosis Date  . Allergy   . Migraines   . Numbness and tingling    left side of body, occasionally right side   . S/P Botox injection     Past Surgical History:  Procedure Laterality Date  . ARM NEUROPLASTY Right 1994 and 1996   for chronic pain  . CESAREAN SECTION    . OVARIAN CYST REMOVAL  1973  . TONSILLECTOMY AND ADENOIDECTOMY    . TUBAL LIGATION  1988    There were no vitals filed for this visit.  Subjective Assessment - 05/18/19 1656    Subjective  Pt reports that she is doing alright today. She complains of 2/10 R shoulder soreness upon arrival. No specific questions or concerns currently.    Pertinent History  Pt fractured her R radius in ulna in 1993. She underwent a closed reduction with casting. Following removal of the cast she was unable to move her R wrist. She had R wrist surgery the following year and states that they were able to move her wrist under anesthesia however when awake she was still unable to move her wrist. She underwent a repeat R wrist surgery the following year followed by 1 year of physical therapy. She states that she had a NCV and multiple testing which didn't reveal any issues. She believes that the pain  in her RUE was originating from her neck. She started seeking multiple treatments in 2012 including chiropractic, dry needling, acupuncture, myofascial release for her widespread chronic pain. She states that she saw some improvement at that time. Approximately 2 years ago she started having heaviness and tingling in the L arm and L leg. She states that they have done an MRI of her brain, neck, and thoracic spine without any notable findings or explanations. They have also done NCV studies for both sides without any significant findings. She has had extensive testing without any significant findings to explain her symptoms. She states that the RLE is relatively unaffected. Prior to coming to therapy she was seeing a massage therapist for myofascial release and seeing chiropractor for dry needling and adjustments. She also sees another massage therapist for general massage for head, neck, and back. She has a history of chronic migraines for at least 10 years. She reports that her migraines are L temporal and she has started having some L orbital pain more recently as well with her migraines. She reports that her migraines are constant and are always present generally "at a low level." She also gets nausea and upset stomach during the migraines and feels like her balance is off. She saw ENT and  states that they did vestibular testing with a deficit identified in her R ear. She reports that she worked with vestibular therapy and upon retesting it has "improved which surprised the ENT." Her neurologist believes that the rest of her symptoms in her BUE/LLE are a manifestation of migraines. She receives botox injections every 12 weeks for the migraines by her neurologist. She also gets trigger point injections every 4 weeks as well. Most of the focus from her chiropractor is R upper quadrant (shoulder, R side of neck). Pt does have occasional L shoulder pain.    Diagnostic tests  negative MRI for cervical, thoracic,  brain    Patient Stated Goals  Decrease R shoulder/arm/neck pain    Currently in Pain?  Yes    Pain Score  2     Pain Location  Neck    Pain Orientation  Right    Pain Descriptors / Indicators  Sore    Pain Type  Chronic pain    Pain Onset  More than a month ago          TREATMENT   Manual Therapy Supine gentle cervical manual traction 15s hold, 15s rest x4bouts; Suboccipital release x 60s; Repeated cervical retractions 3s hold x 10; Upper trap stretch 30s x 2 holdbilaterally Levator scapulae stretch 30sx 2 bilaterally; First rib mobilizationsbilaterally, grade II-III, 30s/bout x3bouts each; C2-C6 CPA mobilizations, grade I, 30s/bout x 1 bout/level; Extensive IASTM to R upper/mid trap, levator, supraspinatus, infraspinatus Pt completed NDI and QuickDASH (unbilled);   Trigger Point Dry Needling (TDN), unbilled Education performed with patient regarding potential benefit of TDN. Reviewed precautions and risks with patient. Pt provided verbal consent to treatment. TDN performed toR suboccipitals with 2, 0.3 x 50 single needle placements. Local twitch response (LTR) with both placements. Pistoning technique utilized.    Pt educated throughout session about proper posture and technique with exercises. Improved exercise technique, movement at target joints, use of target muscles after min to mod verbal, visual, tactile cues.   Ptreports that she is noticing some small improvement in her pain. Her QuickDASH decreased from 50% at initial evaluation to 9.1% today. Her NDI improved from 30% at her initial evaluation to 18% today. Her worse pain has decreased from 6/10 to 5/10. She continues to report pain in R neck/trap/shoulder with R cervical rotation, bilateral lateral cervical flexion, and cervical extension. Pt denies any increase in pain with manual techniques today. She reports improved pain-free cervical mobility following session. Will continue to progress manual  techniques and move toward graded exercises as pt tolerates. Shewill benefit from PT services to address deficits inpainin order to return to full function at home and work with less pain.                        PT Short Term Goals - 05/18/19 1658      PT SHORT TERM GOAL #1   Title  Pt will be independent with HEP in order to improve RUE use and decrease pain in order to improve pain-free function at home and work.    Time  6    Period  Weeks    Status  On-going    Target Date  05/29/19        PT Long Term Goals - 05/18/19 1659      PT LONG TERM GOAL #1   Title  Pt will decrease quick DASH score by at least 8% in order to demonstrate clinically significant reduction  in disability.    Baseline  04/17/19: To complete at next visit; 04/19/19: 50%; 05/18/19: 9.1%    Time  12    Period  Weeks    Status  Achieved    Target Date  --      PT LONG TERM GOAL #2   Title  Pt will decrease worst pain as reported on NPRS by at least 3 points in order to demonstrate clinically significant reduction in pain.    Baseline  04/17/19: Worst: 6/10; 05/18/19: Worst: 5/10;    Time  12    Period  Weeks    Status  Partially Met    Target Date  07/10/19      PT LONG TERM GOAL #3   Title  Pt will demonstrate pain-free cervical motion in order to perform IADLs such as driving and household chores without increase in R cervical/R shoulder pain    Baseline  04/17/19: Pain with R cervical rotation, bilateral lateral flexion, and extension; 05/18/19: unchanged    Time  12    Period  Weeks    Status  On-going    Target Date  07/10/19      PT LONG TERM GOAL #4   Title  Pt will demonstrate decrease in NDI by at least 19% in order to demonstrate clinically significant reduction in disability related to neck injury/pain    Baseline  04/19/19: 30%; 05/18/19: 18%    Time  12    Period  Weeks    Status  Partially Met    Target Date  07/10/19            Plan - 05/18/19 1658    Clinical  Impression Statement  Pt reports that she is noticing some small improvement in her pain. Her QuickDASH decreased from 50% at initial evaluation to 9.1% today. Her NDI improved from 30% at her initial evaluation to 18% today. Her worse pain has decreased from 6/10 to 5/10. She continues to report pain in R neck/trap/shoulder with R cervical rotation, bilateral lateral cervical flexion, and cervical extension. Pt denies any increase in pain with manual techniques today. She reports improved pain-free cervical mobility following session. Will continue to progress manual techniques and move toward graded exercises as pt tolerates. She will benefit from PT services to address deficits in pain in order to return to full function at home and work with less pain.    Personal Factors and Comorbidities  Age;Behavior Pattern;Comorbidity 1;Comorbidity 2;Comorbidity 3+;Fitness;Past/Current Experience;Profession;Time since onset of injury/illness/exacerbation    Comorbidities  Migraines, depression, anxiety, widespread chronic pain    Examination-Activity Limitations  Caring for Others;Carry;Lift;Sleep    Examination-Participation Restrictions  Cleaning;Community Activity;Interpersonal Relationship    Stability/Clinical Decision Making  Unstable/Unpredictable    Rehab Potential  Fair    Clinical Impairments Affecting Rehab Potential  chronic pain, motivation, multiple treatment modalities    PT Frequency  2x / week    PT Duration  12 weeks    PT Treatment/Interventions  ADLs/Self Care Home Management;Dry needling;Manual techniques;Therapeutic exercise;Therapeutic activities;Neuromuscular re-education;Aquatic Therapy;Biofeedback;Canalith Repostioning;Cryotherapy;Electrical Stimulation;Iontophoresis 17m/ml Dexamethasone;Moist Heat;Ultrasound;Patient/family education;Passive range of motion;Energy conservation;Vestibular;Joint Manipulations    PT Next Visit Plan  Perform trigger point dry needling and manual techniques  until pt can tolerate active strengthening, provide education about insomnia and its relationship to chronic pain, additional chronic pain education    PT Home Exercise Plan  Upper trap, levator, and anterior scalene stretches bilaterally    Consulted and Agree with Plan of Care  Patient  Patient will benefit from skilled therapeutic intervention in order to improve the following deficits and impairments:  Impaired perceived functional ability, Impaired UE functional use, Pain  Visit Diagnosis: 1. Chronic right shoulder pain   2. Cervicalgia        Problem List Patient Active Problem List   Diagnosis Date Noted  . Postoperative history of checked last year 04/22/2017  . Weakness of left arm 04/22/2017  . Numbness and tingling in left arm 04/22/2017  . Lower back pain 04/02/2016  . Acute stress disorder 02/25/2015  . Anxiety 02/25/2015  . Airway hyperreactivity 02/25/2015  . Bradycardia 02/25/2015  . Hypersomnia 02/25/2015  . Clinical depression 02/25/2015  . Elevated blood sugar 02/25/2015  . 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 Acid reflux 02/25/2015  . 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 H/O disease 02/25/2015  . Hypercholesteremia 02/25/2015  . Headache, migraine 02/25/2015  . Muscle ache 02/25/2015  . Allergic rhinitis, seasonal 02/25/2015  . Avitaminosis D 02/25/2015    This entire session was performed under direct supervision and direction of a licensed therapist/therapist assistant . I have personally read, edited and approve of the note as written.    Phillips Grout PT, DPT, GCS  Gerod Caligiuri 05/19/2019, 11:59 AM  [place] MAIN [facility] SERVICES [address] [place], [place], [address] Phone: [phone]   Fax:  [phone]  Name: Rachel Fry MRN: [id number] Date of Birth: 1956-03-29

## 2019-05-24 MED FILL — ATORVASTATIN 20 MG TABLET: 20 | 90 days supply | Qty: 90 | Fill #0

## 2019-05-24 MED FILL — EZETIMIBE 10 MG TABS: 10 | 90 days supply | Qty: 90 | Fill #0

## 2019-05-25 ENCOUNTER — Ambulatory Visit: Payer: 59

## 2019-05-25 ENCOUNTER — Other Ambulatory Visit: Payer: Self-pay

## 2019-05-25 DIAGNOSIS — G8929 Other chronic pain: Secondary | ICD-10-CM | POA: Diagnosis not present

## 2019-05-25 DIAGNOSIS — M25511 Pain in right shoulder: Secondary | ICD-10-CM | POA: Diagnosis not present

## 2019-05-25 DIAGNOSIS — M542 Cervicalgia: Secondary | ICD-10-CM | POA: Diagnosis not present

## 2019-05-26 MED FILL — FLOVENT HFA 110 MCG INHALER: 110 | 30 days supply | Qty: 12 | Fill #4

## 2019-05-26 MED FILL — EMGALITY 120 MG/ML SOAJ: 120 | 30 days supply | Qty: 1 | Fill #4

## 2019-05-26 NOTE — Therapy (Signed)
[place] MAIN [facility] SERVICES [address] [place], [place], [address] Phone: [phone]   Fax:  [phone]  Physical Therapy Treatment  Patient Details  Name: Rachel Fry MRN: [id number] Date of Birth: [birth year] Referring Provider (PT): Dr. Karlton Lemon   Encounter Date: 05/25/2019  PT End of Session - 05/26/19 1132    Visit Number  8    Number of Visits  25    Date for PT Re-Evaluation  07/10/19    Authorization Type  eval 04/17/19, goals updated 05/18/19    PT Start Time  1645    PT Stop Time  1730    PT Time Calculation (min)  45 min    Activity Tolerance  Patient tolerated treatment well    Behavior During Therapy  [facility] for tasks assessed/performed       Past Medical History:  Diagnosis Date  . Allergy   . Migraines   . Numbness and tingling    left side of body, occasionally right side   . S/P Botox injection     Past Surgical History:  Procedure Laterality Date  . ARM NEUROPLASTY Right 1994 and 1996   for chronic pain  . CESAREAN SECTION    . OVARIAN CYST REMOVAL  1973  . TONSILLECTOMY AND ADENOIDECTOMY    . TUBAL LIGATION  1988    There were no vitals filed for this visit.  Subjective Assessment - 05/25/19 1651    Subjective  Pt reports that she is doing alright today. Appt on 8/4 for trigger point injections.  States that she feels the suboccipital dry needling helped. No specific questions or concerns currently.    Pertinent History  Pt fractured her R radius in ulna in 1993. She underwent a closed reduction with casting. Following removal of the cast she was unable to move her R wrist. She had R wrist surgery the following year and states that they were able to move her wrist under anesthesia however when awake she was still unable to move her wrist. She underwent a repeat R wrist surgery the following year followed by 1 year of physical therapy. She states that she had a NCV and multiple testing which didn't reveal any  issues. She believes that the pain in her RUE was originating from her neck. She started seeking multiple treatments in 2012 including chiropractic, dry needling, acupuncture, myofascial release for her widespread chronic pain. She states that she saw some improvement at that time. Approximately 2 years ago she started having heaviness and tingling in the L arm and L leg. She states that they have done an MRI of her brain, neck, and thoracic spine without any notable findings or explanations. They have also done NCV studies for both sides without any significant findings. She has had extensive testing without any significant findings to explain her symptoms. She states that the RLE is relatively unaffected. Prior to coming to therapy she was seeing a massage therapist for myofascial release and seeing chiropractor for dry needling and adjustments. She also sees another massage therapist for general massage for head, neck, and back. She has a history of chronic migraines for at least 10 years. She reports that her migraines are L temporal and she has started having some L orbital pain more recently as well with her migraines. She reports that her migraines are constant and are always present generally "at a low level." She also gets nausea and upset stomach during the migraines and feels like her  balance is off. She saw ENT and states that they did vestibular testing with a deficit identified in her R ear. She reports that she worked with vestibular therapy and upon retesting it has "improved which surprised the ENT." Her neurologist believes that the rest of her symptoms in her BUE/LLE are a manifestation of migraines. She receives botox injections every 12 weeks for the migraines by her neurologist. She also gets trigger point injections every 4 weeks as well. Most of the focus from her chiropractor is R upper quadrant (shoulder, R side of neck). Pt does have occasional L shoulder pain.    Diagnostic tests   negative MRI for cervical, thoracic, brain    Patient Stated Goals  Decrease R shoulder/arm/neck pain    Currently in Pain?  Yes    Pain Score  4     Pain Location  Neck    Pain Orientation  Right    Pain Type  Chronic pain    Pain Onset  More than a month ago          TREATMENT       Manual Therapy    Supine gentle cervical manual traction 30s hold, 15s rest x 4 bouts;   Suboccipital release 3x60s;   Upper trap stretch 30s x 3 hold bilaterally   Levator scapulae stretch 30s x 3 bilaterally;   Extensive soft tissue mobilization to R upper/mid trap, levator, supraspinatus, infraspinatus      E-stim   Combo 8 min 3.62mz [place] 125 mV on [place]ivolt setting.  performed over upper trap, levator scapula, and supraspinatus        Trigger Point Dry Needling (TDN), unbilled   Education performed with patient regarding potential benefit of TDN. Reviewed precautions and risks with patient. Pt provided verbal consent to treatment. TDN performed to R upper trap and R suboccipitals with 2, 0.3 x 50 single needle placements each. Local twitch response (LTR) with all placements. Pistoning technique utilized.        Pt educated throughout session about proper posture and technique with exercises. Improved exercise technique, movement at target joints, use of target muscles after min to mod verbal, visual, tactile cues.         Pt reports that she is noticing some small improvement in her pain, she reports that she felt relief after last session, and feels that the suboccipital dry needling helped. She continues to report pain in R neck/trap/shoulder with R cervical rotation, bilateral lateral cervical flexion, and cervical extension. Pt denies any increase in pain with manual techniques today. She reports improved pain-free cervical mobility following session. Will continue to progress manual techniques and move toward graded exercises as pt tolerates. She will benefit from PT services to  address deficits in pain in order to return to full function at home and work with less pain.      PT Short Term Goals - 05/18/19 1658      PT SHORT TERM GOAL #1   Title  Pt will be independent with HEP in order to improve RUE use and decrease pain in order to improve pain-free function at home and work.    Time  6    Period  Weeks    Status  On-going    Target Date  05/29/19        PT Long Term Goals - 05/18/19 1659      PT LONG TERM GOAL #1   Title  Pt will decrease quick DASH score by at  least 8% in order to demonstrate clinically significant reduction in disability.    Baseline  04/17/19: To complete at next visit; 04/19/19: 50%; 05/18/19: 9.1%    Time  12    Period  Weeks    Status  Achieved    Target Date  --      PT LONG TERM GOAL #2   Title  Pt will decrease worst pain as reported on NPRS by at least 3 points in order to demonstrate clinically significant reduction in pain.    Baseline  04/17/19: Worst: 6/10; 05/18/19: Worst: 5/10;    Time  12    Period  Weeks    Status  Partially Met    Target Date  07/10/19      PT LONG TERM GOAL #3   Title  Pt will demonstrate pain-free cervical motion in order to perform IADLs such as driving and household chores without increase in R cervical/R shoulder pain    Baseline  04/17/19: Pain with R cervical rotation, bilateral lateral flexion, and extension; 05/18/19: unchanged    Time  12    Period  Weeks    Status  On-going    Target Date  07/10/19      PT LONG TERM GOAL #4   Title  Pt will demonstrate decrease in NDI by at least 19% in order to demonstrate clinically significant reduction in disability related to neck injury/pain    Baseline  04/19/19: 30%; 05/18/19: 18%    Time  12    Period  Weeks    Status  Partially Met    Target Date  07/10/19            Plan - 05/26/19 1131    Clinical Impression Statement  Pt reports that she is noticing some small improvement in her pain, she reports that she felt relief after last  session, and feels that the suboccipital dry needling helped. She continues to report pain in R neck/trap/shoulder with R cervical rotation, bilateral lateral cervical flexion, and cervical extension. Pt denies any increase in pain with manual techniques today. She reports improved pain-free cervical mobility following session. Will continue to progress manual techniques and move toward graded exercises as pt tolerates. She will benefit from PT services to address deficits in pain in order to return to full function at home and work with less pain.    Personal Factors and Comorbidities  Age;Behavior Pattern;Comorbidity 1;Comorbidity 2;Comorbidity 3+;Fitness;Past/Current Experience;Profession;Time since onset of injury/illness/exacerbation    Comorbidities  Migraines, depression, anxiety, widespread chronic pain    Examination-Activity Limitations  Caring for Others;Carry;Lift;Sleep    Examination-Participation Restrictions  Cleaning;Community Activity;Interpersonal Relationship    Stability/Clinical Decision Making  Unstable/Unpredictable    Rehab Potential  Fair    Clinical Impairments Affecting Rehab Potential  chronic pain, motivation, multiple treatment modalities    PT Frequency  2x / week    PT Duration  12 weeks    PT Treatment/Interventions  ADLs/Self Care Home Management;Dry needling;Manual techniques;Therapeutic exercise;Therapeutic activities;Neuromuscular re-education;Aquatic Therapy;Biofeedback;Canalith Repostioning;Cryotherapy;Electrical Stimulation;Iontophoresis 21m/ml Dexamethasone;Moist Heat;Ultrasound;Patient/family education;Passive range of motion;Energy conservation;Vestibular;Joint Manipulations    PT Next Visit Plan  Perform trigger point dry needling and manual techniques until pt can tolerate active strengthening, provide education about insomnia and its relationship to chronic pain, additional chronic pain education    PT Home Exercise Plan  Upper trap, levator, and anterior  scalene stretches bilaterally    Consulted and Agree with Plan of Care  Patient       Patient will  benefit from skilled therapeutic intervention in order to improve the following deficits and impairments:  Impaired perceived functional ability, Impaired UE functional use, Pain  Visit Diagnosis: 1. Chronic right shoulder pain   2. Cervicalgia        Problem List Patient Active Problem List   Diagnosis Date Noted  . Postoperative history of checked last year 04/22/2017  . Weakness of left arm 04/22/2017  . Numbness and tingling in left arm 04/22/2017  . Lower back pain 04/02/2016  . Acute stress disorder 02/25/2015  . Anxiety 02/25/2015  . Airway hyperreactivity 02/25/2015  . Bradycardia 02/25/2015  . Hypersomnia 02/25/2015  . Clinical depression 02/25/2015  . Elevated blood sugar 02/25/2015  . 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 Acid reflux 02/25/2015  . 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 H/O disease 02/25/2015  . Hypercholesteremia 02/25/2015  . Headache, migraine 02/25/2015  . Muscle ache 02/25/2015  . Allergic rhinitis, seasonal 02/25/2015  . Avitaminosis D 02/25/2015    This entire session was performed under direct supervision and direction of a licensed therapist/therapist assistant . I have personally read, edited and approve of the note as written.   Lutricia Horsfall, SPT [facility] Grout PT, DPT, GCS  Huprich,Jason 05/26/2019, 2:13 PM  [place] MAIN [facility] SERVICES [address] [place], [place], [address] Phone: [phone]   Fax:  [phone]  Name: Rachel Fry MRN: [id number] Date of Birth: 1956-07-16

## 2019-05-30 DIAGNOSIS — M542 Cervicalgia: Secondary | ICD-10-CM | POA: Diagnosis not present

## 2019-05-30 DIAGNOSIS — M791 Myalgia, unspecified site: Secondary | ICD-10-CM | POA: Diagnosis not present

## 2019-06-02 MED FILL — LOSARTAN POTASSIUM 25 MG TA: 25 | 30 days supply | Qty: 30 | Fill #1

## 2019-06-06 MED FILL — predniSONE 5 MG TABS: 5 | 30 days supply | Qty: 30 | Fill #0

## 2019-06-07 ENCOUNTER — Other Ambulatory Visit: Payer: Self-pay

## 2019-06-07 ENCOUNTER — Ambulatory Visit: Payer: 59 | Attending: Internal Medicine

## 2019-06-07 DIAGNOSIS — M542 Cervicalgia: Secondary | ICD-10-CM | POA: Diagnosis not present

## 2019-06-07 DIAGNOSIS — G8929 Other chronic pain: Secondary | ICD-10-CM | POA: Insufficient documentation

## 2019-06-07 DIAGNOSIS — M25511 Pain in right shoulder: Secondary | ICD-10-CM | POA: Insufficient documentation

## 2019-06-07 NOTE — Therapy (Signed)
[place] MAIN [facility] SERVICES [place], [place], [address] Phone: [phone]   Fax:  [phone]  Physical Therapy Treatment  Patient Details  Name: Rachel Fry MRN: [id number] Date of Birth: 18-Jun-1956 Referring Provider (PT): Dr. Karlton Lemon   Encounter Date: 06/07/2019  PT End of Session - 06/07/19 1655    Visit Number  9    Number of Visits  25    Date for PT Re-Evaluation  07/10/19    Authorization Type  eval 04/17/19, goals updated 05/18/19    PT Start Time  1600    PT Stop Time  1645    PT Time Calculation (min)  45 min    Activity Tolerance  Patient tolerated treatment well    Behavior During Therapy  [facility] for tasks assessed/performed       Past Medical History:  Diagnosis Date  . Allergy   . Migraines   . Numbness and tingling    left side of body, occasionally right side   . S/P Botox injection     Past Surgical History:  Procedure Laterality Date  . ARM NEUROPLASTY Right 1994 and 1996   for chronic pain  . CESAREAN SECTION    . OVARIAN CYST REMOVAL  1973  . TONSILLECTOMY AND ADENOIDECTOMY    . TUBAL LIGATION  1988    There were no vitals filed for this visit.  Subjective Assessment - 06/07/19 1606    Subjective  Pt reports that she has been experiencing the weakness and numbness feeling in her LLE and LUE Wednesday, Thursday, and Friday.  This is consistent with her migraine symptoms and she doesn't feel a need to contact her MD. She states that she had trigger point injections last week and that she requested the steroid injections.  She hasn't notice many chages yet from the injections. No specific questions or concerns currently.    Pertinent History  Pt fractured her R radius in ulna in 1993. She underwent a closed reduction with casting. Following removal of the cast she was unable to move her R wrist. She had R wrist surgery the following year and states that they were able to move her wrist under  anesthesia however when awake she was still unable to move her wrist. She underwent a repeat R wrist surgery the following year followed by 1 year of physical therapy. She states that she had a NCV and multiple testing which didn't reveal any issues. She believes that the pain in her RUE was originating from her neck. She started seeking multiple treatments in 2012 including chiropractic, dry needling, acupuncture, myofascial release for her widespread chronic pain. She states that she saw some improvement at that time. Approximately 2 years ago she started having heaviness and tingling in the L arm and L leg. She states that they have done an MRI of her brain, neck, and thoracic spine without any notable findings or explanations. They have also done NCV studies for both sides without any significant findings. She has had extensive testing without any significant findings to explain her symptoms. She states that the RLE is relatively unaffected. Prior to coming to therapy she was seeing a massage therapist for myofascial release and seeing chiropractor for dry needling and adjustments. She also sees another massage therapist for general massage for head, neck, and back. She has a history of chronic migraines for at least 10 years. She reports that her migraines are L temporal and she has started having  some L orbital pain more recently as well with her migraines. She reports that her migraines are constant and are always present generally "at a low level." She also gets nausea and upset stomach during the migraines and feels like her balance is off. She saw ENT and states that they did vestibular testing with a deficit identified in her R ear. She reports that she worked with vestibular therapy and upon retesting it has "improved which surprised the ENT." Her neurologist believes that the rest of her symptoms in her BUE/LLE are a manifestation of migraines. She receives botox injections every 12 weeks for the  migraines by her neurologist. She also gets trigger point injections every 4 weeks as well. Most of the focus from her chiropractor is R upper quadrant (shoulder, R side of neck). Pt does have occasional L shoulder pain.    Diagnostic tests  negative MRI for cervical, thoracic, brain    Patient Stated Goals  Decrease R shoulder/arm/neck pain    Currently in Pain?  Yes    Pain Score  2     Pain Location  Ear    Pain Orientation  Right    Pain Descriptors / Indicators  Sharp    Pain Type  Chronic pain    Pain Onset  More than a month ago    Multiple Pain Sites  Yes    Pain Score  2    Pain Location  Head    Pain Orientation  Left    Pain Descriptors / Indicators  Aching    Pain Type  Chronic pain    Pain Onset  More than a month ago    Pain Frequency  Intermittent       TREATMENT      Manual Therapy      Supine gentle cervical manual traction 30s hold, 15s rest x 4 bouts;     Suboccipital release 3x60s;     Upper trap stretch 4x45s bilaterally     Levator scapulae stretch 4x45s bilaterally;     Extensive soft tissue mobilization to B upper/mid trap, levator, supraspinatus, infraspinatus; cervical paraspinals.  L side feels tighter than R.      CPA cervical joint mobs 2x30s bouts levels C2-C7; grade III-IV   CPAs thoracic T1-T6 2x30s bouts; grade III-IV  Cervical retractions 2x20sec      Pt educated throughout session about proper posture and technique with exercises. Improved exercise technique, movement at target joints, use of target muscles after min to mod verbal, visual, tactile cues.           Pt reports that she had her trigger point injections last week, but isn't sure if she has felt any relief yet.  She reports a mild ache in her L temporal region and R ear.  She endorses some relief from soft tissue mobilization and manual cervical traction.  She presents with increased tension in the L upper trap, L levator scapula, and L paravertebral muscles today.  She  reports improved pain-free cervical mobility following session. Will continue to progress manual techniques and move toward graded exercises as pt tolerates. She will benefit from PT services to address deficits in pain in order to return to full function at home and work with less pain.        PT Short Term Goals - 05/18/19 1658      PT SHORT TERM GOAL #1   Title  Pt will be independent with HEP in order to improve RUE use and  decrease pain in order to improve pain-free function at home and work.    Time  6    Period  Weeks    Status  On-going    Target Date  05/29/19        PT Long Term Goals - 05/18/19 1659      PT LONG TERM GOAL #1   Title  Pt will decrease quick DASH score by at least 8% in order to demonstrate clinically significant reduction in disability.    Baseline  04/17/19: To complete at next visit; 04/19/19: 50%; 05/18/19: 9.1%    Time  12    Period  Weeks    Status  Achieved    Target Date  --      PT LONG TERM GOAL #2   Title  Pt will decrease worst pain as reported on NPRS by at least 3 points in order to demonstrate clinically significant reduction in pain.    Baseline  04/17/19: Worst: 6/10; 05/18/19: Worst: 5/10;    Time  12    Period  Weeks    Status  Partially Met    Target Date  07/10/19      PT LONG TERM GOAL #3   Title  Pt will demonstrate pain-free cervical motion in order to perform IADLs such as driving and household chores without increase in R cervical/R shoulder pain    Baseline  04/17/19: Pain with R cervical rotation, bilateral lateral flexion, and extension; 05/18/19: unchanged    Time  12    Period  Weeks    Status  On-going    Target Date  07/10/19      PT LONG TERM GOAL #4   Title  Pt will demonstrate decrease in NDI by at least 19% in order to demonstrate clinically significant reduction in disability related to neck injury/pain    Baseline  04/19/19: 30%; 05/18/19: 18%    Time  12    Period  Weeks    Status  Partially Met    Target Date   07/10/19            Plan - 06/08/19 0817    Clinical Impression Statement  Pt reports that she had her trigger point injections last week, but isn't sure if she has felt any relief yet.  She reports a mild ache in her L temporal region and R ear.  She endorses some relief from soft tissue mobilization and manual cervical traction.  She presents with increased tension in the L upper trap, L levator scapula, and L paravertebral muscles today.  She reports improved pain-free cervical mobility following session. Will continue to progress manual techniques and move toward graded exercises as pt tolerates. She will benefit from PT services to address deficits in pain in order to return to full function at home and work with less pain.    Personal Factors and Comorbidities  Age;Behavior Pattern;Comorbidity 1;Comorbidity 2;Comorbidity 3+;Fitness;Past/Current Experience;Profession;Time since onset of injury/illness/exacerbation    Comorbidities  Migraines, depression, anxiety, widespread chronic pain    Examination-Activity Limitations  Caring for Others;Carry;Lift;Sleep    Examination-Participation Restrictions  Cleaning;Community Activity;Interpersonal Relationship    Stability/Clinical Decision Making  Unstable/Unpredictable    Rehab Potential  Fair    Clinical Impairments Affecting Rehab Potential  chronic pain, motivation, multiple treatment modalities    PT Frequency  2x / week    PT Duration  12 weeks    PT Treatment/Interventions  ADLs/Self Care Home Management;Dry needling;Manual techniques;Therapeutic exercise;Therapeutic activities;Neuromuscular re-education;Aquatic Therapy;Biofeedback;Canalith Repostioning;Cryotherapy;Dealer  Stimulation;Iontophoresis 27m/ml Dexamethasone;Moist Heat;Ultrasound;Patient/family education;Passive range of motion;Energy conservation;Vestibular;Joint Manipulations    PT Next Visit Plan  Progress note; Perform trigger point dry needling and manual techniques  until pt can tolerate active strengthening, provide education about insomnia and its relationship to chronic pain, additional chronic pain education    PT Home Exercise Plan  Upper trap, levator, and anterior scalene stretches bilaterally    Consulted and Agree with Plan of Care  Patient       Patient will benefit from skilled therapeutic intervention in order to improve the following deficits and impairments:  Impaired perceived functional ability, Impaired UE functional use, Pain  Visit Diagnosis: 1. Chronic right shoulder pain   2. Cervicalgia        Problem List Patient Active Problem List   Diagnosis Date Noted  . Postoperative history of checked last year 04/22/2017  . Weakness of left arm 04/22/2017  . Numbness and tingling in left arm 04/22/2017  . Lower back pain 04/02/2016  . Acute stress disorder 02/25/2015  . Anxiety 02/25/2015  . Airway hyperreactivity 02/25/2015  . Bradycardia 02/25/2015  . Hypersomnia 02/25/2015  . Clinical depression 02/25/2015  . Elevated blood sugar 02/25/2015  . 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 Acid reflux 02/25/2015  . 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 H/O disease 02/25/2015  . Hypercholesteremia 02/25/2015  . Headache, migraine 02/25/2015  . Muscle ache 02/25/2015  . Allergic rhinitis, seasonal 02/25/2015  . Avitaminosis D 02/25/2015    This entire session was performed under direct supervision and direction of a licensed therapist/therapist assistant . I have personally read, edited and approve of the note as written.   TLutricia Horsfall SPT JPhillips GroutPT, DPT, GCS  Huprich,Jason 06/08/2019, 10:53 AM  [place] [facility] [address] [address]Phone: [phone]  Fax:  [phone] Name: Rachel GONSALVESMRN: [id number] of Birth: 807-08-1956

## 2019-06-12 MED FILL — LEVOTHYROXINE 50 MCG TABLET: 50 | 90 days supply | Qty: 90 | Fill #1

## 2019-06-12 MED FILL — NURTEC 75 MG TBDP: 75 | 30 days supply | Qty: 8 | Fill #0

## 2019-06-13 ENCOUNTER — Ambulatory Visit: Payer: 59

## 2019-06-13 ENCOUNTER — Other Ambulatory Visit: Payer: Self-pay

## 2019-06-13 DIAGNOSIS — G8929 Other chronic pain: Secondary | ICD-10-CM | POA: Diagnosis not present

## 2019-06-13 DIAGNOSIS — M25511 Pain in right shoulder: Secondary | ICD-10-CM | POA: Diagnosis not present

## 2019-06-13 DIAGNOSIS — M542 Cervicalgia: Secondary | ICD-10-CM

## 2019-06-13 NOTE — Therapy (Signed)
[place] MAIN [facility] SERVICES [place], [place], [address] Phone: [phone]   Fax:  [phone]   Physical Therapy Progress Note   Dates of reporting period  04/19/19   to   06/13/19   Patient Details  Name: Rachel Fry MRN: [id number] Date of Birth: 1956-09-23 Referring Provider (PT): Dr. Karlton Lemon   Encounter Date: 06/13/2019  PT End of Session - 06/13/19 1609    Visit Number  10    Number of Visits  25    Date for PT Re-Evaluation  07/10/19    Authorization Type  eval 04/17/19, goals updated 06/13/19    PT Start Time  1602    PT Stop Time  1645    PT Time Calculation (min)  43 min    Activity Tolerance  Patient tolerated treatment well    Behavior During Therapy  [facility] for tasks assessed/performed       Past Medical History:  Diagnosis Date  . Allergy   . Migraines   . Numbness and tingling    left side of body, occasionally right side   . S/P Botox injection     Past Surgical History:  Procedure Laterality Date  . ARM NEUROPLASTY Right 1994 and 1996   for chronic pain  . CESAREAN SECTION    . OVARIAN CYST REMOVAL  1973  . TONSILLECTOMY AND ADENOIDECTOMY    . TUBAL LIGATION  1988    There were no vitals filed for this visit.  Subjective Assessment - 06/13/19 1602    Subjective  Pt reports continued weakness LUE and LLE and headache consistent for the past week. She has been experiencing visual changes in the right eye where her vision blurs intermittently. Pt states no apparent changes since receiving the last injections. She says that towards the next botox appt the migraine pattern tends to 'ramp up' which she thinks is what is occuring at this time. These symptoms are consistent with previous episodes and pt doesn't feel like she needs additional evaluation by her MD.    Pertinent History  Pt fractured her R radius in ulna in 1993. She underwent a closed reduction with casting. Following removal of the cast  she was unable to move her R wrist. She had R wrist surgery the following year and states that they were able to move her wrist under anesthesia however when awake she was still unable to move her wrist. She underwent a repeat R wrist surgery the following year followed by 1 year of physical therapy. She states that she had a NCV and multiple testing which didn't reveal any issues. She believes that the pain in her RUE was originating from her neck. She started seeking multiple treatments in 2012 including chiropractic, dry needling, acupuncture, myofascial release for her widespread chronic pain. She states that she saw some improvement at that time. Approximately 2 years ago she started having heaviness and tingling in the L arm and L leg. She states that they have done an MRI of her brain, neck, and thoracic spine without any notable findings or explanations. They have also done NCV studies for both sides without any significant findings. She has had extensive testing without any significant findings to explain her symptoms. She states that the RLE is relatively unaffected. Prior to coming to therapy she was seeing a massage therapist for myofascial release and seeing chiropractor for dry needling and adjustments. She also sees another massage therapist for general massage for head,  neck, and back. She has a history of chronic migraines for at least 10 years. She reports that her migraines are L temporal and she has started having some L orbital pain more recently as well with her migraines. She reports that her migraines are constant and are always present generally "at a low level." She also gets nausea and upset stomach during the migraines and feels like her balance is off. She saw ENT and states that they did vestibular testing with a deficit identified in her R ear. She reports that she worked with vestibular therapy and upon retesting it has "improved which surprised the ENT." Her neurologist believes that  the rest of her symptoms in her BUE/LLE are a manifestation of migraines. She receives botox injections every 12 weeks for the migraines by her neurologist. She also gets trigger point injections every 4 weeks as well. Most of the focus from her chiropractor is R upper quadrant (shoulder, R side of neck). Pt does have occasional L shoulder pain.    Diagnostic tests  negative MRI for cervical, thoracic, brain    Patient Stated Goals  Decrease R shoulder/arm/neck pain    Currently in Pain?  Yes    Pain Score  2     Pain Location  Ear    Pain Orientation  Left;Right    Pain Descriptors / Indicators  Aching    Pain Type  Chronic pain    Pain Onset  More than a month ago    Pain Onset  --        TREATMENT NDI: 28% (unbilled)  Quick DASH: 29.5% (unbilled);  Manual Therapy       Supine gentle cervical manual traction 30s hold, 15s rest x 3 bouts;       Upper trap stretch 3x45s bilaterally; limitations to L sidebending noted    Levator scapulae stretch 3x45s bilaterally;      STM to R upper/mid trap, levator, supraspinatus, infraspinatus; cervical paraspinals   CPAs and UPAs cervical joint mobs 2x30s bouts levels C2-C7; grade III-IV; TTP over C7-T2    CPAs thoracic T1-T6 2x30s bouts; grade III-IV   Cervical retractions 5s x10    Trigger Point Dry Needling (TDN), unbilled    Education performed with patient regarding potential benefit of TDN. Reviewed precautions and risks with patient. Pt provided verbal consent to treatment. TDN performed to R upper trap and R suboccipitals with 2, 0.3 x 50 single needle placements each. Local twitch response (LTR) with all placements. Pistoning technique utilized.       Pt educated throughout session about proper posture and technique with exercises. Improved exercise technique, movement at target joints, use of target muscles after min to mod verbal, visual, tactile cues.            Pt presents to PT today on time and highly motivated for  session. She endorses some relief from soft tissue mobilization and manual cervical traction. Noted limitations in sidebending to the L. Pt endorses pain with motion actively. She was tender to CPAs over C7-T2 but tolerated well. Pt experiences pain relief with cervical retractions in session and reports trying to do them at home often. She presents with increased tension in the L upper trap, L levator scapula, and L paravertebral muscles today. NDI and Quick Deliah Boston have both increased since last goal assessment with 28% and 29.5% respectively however improved from initial evaluation. Will continue to progress manual techniques and move toward graded exercises as pt tolerates. She will benefit  from PT services to address deficits in pain in order to return to full function at home and work with less pain.         PT Short Term Goals - 06/14/19 1328      PT SHORT TERM GOAL #1   Title  Pt will be independent with HEP in order to improve RUE use and decrease pain in order to improve pain-free function at home and work.    Time  6    Period  Weeks    Status  Achieved    Target Date  05/29/19        PT Long Term Goals - 06/13/19 1611      PT LONG TERM GOAL #1   Title  Pt will decrease quick DASH score by at least 8% in order to demonstrate clinically significant reduction in disability.    Baseline  04/19/19: 50%; 05/18/19: 9.1%; 06/13/19: 29.5%    Time  12    Period  Weeks    Status  Achieved      PT LONG TERM GOAL #2   Title  Pt will decrease worst pain as reported on NPRS by at least 3 points in order to demonstrate clinically significant reduction in pain.    Baseline  04/17/19: Worst: 6/10; 05/18/19: Worst: 5/10; 06/13/19: worst 5/10    Time  12    Period  Weeks    Status  Partially Met    Target Date  07/10/19      PT LONG TERM GOAL #3   Title  Pt will demonstrate pain-free cervical motion in order to perform IADLs such as driving and household chores without increase in R cervical/R  shoulder pain    Baseline  04/17/19: Pain with R cervical rotation, bilateral lateral flexion, and extension; 05/18/19: unchanged; 06/13/19: min pain with L cervical rotation, R lateral flexion, extension; pain with R cervical rotation and L lateral flexion    Time  12    Period  Weeks    Status  On-going    Target Date  07/10/19      PT LONG TERM GOAL #4   Title  Pt will demonstrate decrease in NDI by at least 19% in order to demonstrate clinically significant reduction in disability related to neck injury/pain    Baseline  04/19/19: 30%; 05/18/19: 18%; 06/13/19: 28%    Time  12    Period  Weeks    Status  On-going    Target Date  07/10/19            Plan - 06/13/19 1609    Clinical Impression Statement  Pt presents to PT today on time and highly motivated for session. She endorses some relief from soft tissue mobilization and manual cervical traction. Noted limitations in sidebending to the L. Pt endorses pain with motion actively. She was tender to CPAs over C7-T2 but tolerated well. Pt experiences pain relief with cervical retractions in session and reports trying to do them at home often. She presents with increased tension in the L upper trap, L levator scapula, and L paravertebral muscles today. NDI and Quick Deliah Boston have both increased since last goal assessment with 28% and 29.5% respectively however improved from initial evaluation. Will continue to progress manual techniques and move toward graded exercises as pt tolerates. She will benefit from PT services to address deficits in pain in order to return to full function at home and work with less pain.    Personal Factors and  Comorbidities  Age;Behavior Pattern;Comorbidity 1;Comorbidity 2;Comorbidity 3+;Fitness;Past/Current Experience;Profession;Time since onset of injury/illness/exacerbation    Comorbidities  Migraines, depression, anxiety, widespread chronic pain    Examination-Activity Limitations  Caring for Others;Carry;Lift;Sleep     Examination-Participation Restrictions  Cleaning;Community Activity;Interpersonal Relationship    Stability/Clinical Decision Making  Unstable/Unpredictable    Rehab Potential  Fair    Clinical Impairments Affecting Rehab Potential  chronic pain, motivation, multiple treatment modalities    PT Frequency  2x / week    PT Duration  12 weeks    PT Treatment/Interventions  ADLs/Self Care Home Management;Dry needling;Manual techniques;Therapeutic exercise;Therapeutic activities;Neuromuscular re-education;Aquatic Therapy;Biofeedback;Canalith Repostioning;Cryotherapy;Electrical Stimulation;Iontophoresis 52m/ml Dexamethasone;Moist Heat;Ultrasound;Patient/family education;Passive range of motion;Energy conservation;Vestibular;Joint Manipulations    PT Next Visit Plan  Progress note; Perform trigger point dry needling and manual techniques until pt can tolerate active strengthening, provide education about insomnia and its relationship to chronic pain, additional chronic pain education    PT Home Exercise Plan  Upper trap, levator, and anterior scalene stretches bilaterally    Consulted and Agree with Plan of Care  Patient       Patient will benefit from skilled therapeutic intervention in order to improve the following deficits and impairments:  Impaired perceived functional ability, Impaired UE functional use, Pain  Visit Diagnosis: 1. Chronic right shoulder pain   2. Cervicalgia        Problem List Patient Active Problem List   Diagnosis Date Noted  . Postoperative history of checked last year 04/22/2017  . Weakness of left arm 04/22/2017  . Numbness and tingling in left arm 04/22/2017  . Lower back pain 04/02/2016  . Acute stress disorder 02/25/2015  . Anxiety 02/25/2015  . Airway hyperreactivity 02/25/2015  . Bradycardia 02/25/2015  . Hypersomnia 02/25/2015  . Clinical depression 02/25/2015  . Elevated blood sugar 02/25/2015  . 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 Acid reflux 02/25/2015  .  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 H/O disease 02/25/2015  . Hypercholesteremia 02/25/2015  . Headache, migraine 02/25/2015  . Muscle ache 02/25/2015  . Allergic rhinitis, seasonal 02/25/2015  . Avitaminosis D 02/25/2015   This entire session was performed under direct supervision and direction of a licensed therapist/therapist assistant . I have personally read, edited and approve of the note as written.   NElmyra RicksTurcotte SPT JPhillips GroutPT, DPT, GCS  Huprich,Jason 06/14/2019, 2:23 PM  [place] [facility]SERV[facility] [address] [place] [address]Phone: [phone]  Fax:  [phone] Name: SDARLY MASSIMRN: [id number] of Birth: [birth year]

## 2019-06-15 MED FILL — VIT D2 1.25 MG (50,000 UNIT: 1.25 MG | 56 days supply | Qty: 8 | Fill #2

## 2019-06-15 MED FILL — NARATRIPTAN HCL 2.5 MG TAB: 2.5 | 15 days supply | Qty: 7 | Fill #0

## 2019-06-21 ENCOUNTER — Other Ambulatory Visit: Payer: Self-pay

## 2019-06-21 ENCOUNTER — Ambulatory Visit: Payer: 59

## 2019-06-21 DIAGNOSIS — M542 Cervicalgia: Secondary | ICD-10-CM

## 2019-06-21 DIAGNOSIS — M25511 Pain in right shoulder: Secondary | ICD-10-CM | POA: Diagnosis not present

## 2019-06-21 DIAGNOSIS — G8929 Other chronic pain: Secondary | ICD-10-CM | POA: Diagnosis not present

## 2019-06-22 MED FILL — tiZANidine HCL 4 MG TABS: 4 | 30 days supply | Qty: 90 | Fill #0

## 2019-06-22 NOTE — Therapy (Signed)
[place] MAIN [facility] SERVICES [address] [place], [place], [address] Phone: [phone]   Fax:  [phone]  Physical Therapy Treatment  Patient Details  Name: Rachel Fry MRN: [id number] Date of Birth: 11-Feb-1956 Referring Provider (PT): Dr. Karlton Lemon   Encounter Date: 06/21/2019  PT End of Session - 06/22/19 0839    Visit Number  11    Number of Visits  25    Date for PT Re-Evaluation  07/10/19    Authorization Type  eval 04/17/19, goals updated 06/13/19    PT Start Time  1605    PT Stop Time  1644    PT Time Calculation (min)  39 min    Activity Tolerance  Patient tolerated treatment well    Behavior During Therapy  [facility] for tasks assessed/performed       Past Medical History:  Diagnosis Date  . Allergy   . Migraines   . Numbness and tingling    left side of body, occasionally right side   . S/P Botox injection     Past Surgical History:  Procedure Laterality Date  . ARM NEUROPLASTY Right 1994 and 1996   for chronic pain  . CESAREAN SECTION    . OVARIAN CYST REMOVAL  1973  . TONSILLECTOMY AND ADENOIDECTOMY    . TUBAL LIGATION  1988    There were no vitals filed for this visit.  Subjective Assessment - 06/22/19 0837    Subjective  Pt reports continued weakness LUE and LLE and headache. She contacted her MD who is aware of her symptoms. He offerred to order an MRI however pt states she has had 2 already and doesn't believe that it will show anything different. She believes that her symptoms are consistent with past episodes they have just lasted longer than normal. She is off work tomorrow and Friday. No resting RUE pain but does report some R upper trap pulling when rotating her head to the right. She has botox injections scheduled for next week. Otherwise no specific questions or concerns.    Pertinent History  Pt fractured her R radius in ulna in 1993. She underwent a closed reduction with casting. Following removal of the  cast she was unable to move her R wrist. She had R wrist surgery the following year and states that they were able to move her wrist under anesthesia however when awake she was still unable to move her wrist. She underwent a repeat R wrist surgery the following year followed by 1 year of physical therapy. She states that she had a NCV and multiple testing which didn't reveal any issues. She believes that the pain in her RUE was originating from her neck. She started seeking multiple treatments in 2012 including chiropractic, dry needling, acupuncture, myofascial release for her widespread chronic pain. She states that she saw some improvement at that time. Approximately 2 years ago she started having heaviness and tingling in the L arm and L leg. She states that they have done an MRI of her brain, neck, and thoracic spine without any notable findings or explanations. They have also done NCV studies for both sides without any significant findings. She has had extensive testing without any significant findings to explain her symptoms. She states that the RLE is relatively unaffected. Prior to coming to therapy she was seeing a massage therapist for myofascial release and seeing chiropractor for dry needling and adjustments. She also sees another massage therapist for general massage for head, neck,  and back. She has a history of chronic migraines for at least 10 years. She reports that her migraines are L temporal and she has started having some L orbital pain more recently as well with her migraines. She reports that her migraines are constant and are always present generally "at a low level." She also gets nausea and upset stomach during the migraines and feels like her balance is off. She saw ENT and states that they did vestibular testing with a deficit identified in her R ear. She reports that she worked with vestibular therapy and upon retesting it has "improved which surprised the ENT." Her neurologist believes  that the rest of her symptoms in her BUE/LLE are a manifestation of migraines. She receives botox injections every 12 weeks for the migraines by her neurologist. She also gets trigger point injections every 4 weeks as well. Most of the focus from her chiropractor is R upper quadrant (shoulder, R side of neck). Pt does have occasional L shoulder pain.    Diagnostic tests  negative MRI for cervical, thoracic, brain    Patient Stated Goals  Decrease R shoulder/arm/neck pain    Currently in Pain?  No/denies          TREATMENT       Manual Therapy  Supine gentle cervical manual traction 20s hold, 10s rest x 3 bouts;    Suboccipital release x 60s; MET suboccipital stretch 5s contract/5s stretch x 3; Upper trap stretch 2 x 30s bilaterally    Levator scapulae stretch 2 x 30s bilaterally;    CPA cervical joint mobs C2-C7 grade II-III 20s/bouts x 1 bout/level;  R UPA cervical joint mobs C2-C7 grade II-III 20s/bouts x 1 bout/level;  Cervical retractions 5s hold x 10 with OP from therapist;  Extensive soft tissue mobilization to R upper trap, levator, and cervical paraspinals. Seated L lateral flexion stretch (scalene) 30s hold x 2;        Trigger Point Dry Needling (TDN), unbilled  Education performed with patient regarding potential benefit of TDN. Reviewed precautions and risks with patient. Pt provided verbal consent to treatment. TDN performed to R upper trap and R suboccipitals with 2, 0.3 x 50 single needle placements each. 1 additional needle placement to R levator scapula and R cervical multifidus. Local twitch response (LTR) with all placements. Pistoning technique utilized.    Pt educated throughout session about proper posture and technique with exercises. Improved exercise technique, movement at target joints, use of target muscles after min to mod verbal, visual, tactile cues.           She endorses some relief from soft tissue mobilization and manual cervical traction but  doesn't really having any resting pain upon arrival today. She tolerates TDN well without any increase in pain and reports more neck mobility following. Overpressure utilized today with repeated cervical retractions. Pt encouraged to monitor symptoms and follow-up with MD as necessary. She has botox injections scheduled for next week. Will continue to progress manual techniques and move toward graded exercises as pt tolerates especially deep neck flexor strengthening. She will benefit from PT services to address deficits in pain in order to return to full function at home and work with less pain.                        PT Short Term Goals - 06/14/19 1328      PT SHORT TERM GOAL #1   Title  Pt will be  independent with HEP in order to improve RUE use and decrease pain in order to improve pain-free function at home and work.    Time  6    Period  Weeks    Status  Achieved    Target Date  05/29/19        PT Long Term Goals - 06/13/19 1611      PT LONG TERM GOAL #1   Title  Pt will decrease quick DASH score by at least 8% in order to demonstrate clinically significant reduction in disability.    Baseline  04/19/19: 50%; 05/18/19: 9.1%; 06/13/19: 29.5%    Time  12    Period  Weeks    Status  Achieved      PT LONG TERM GOAL #2   Title  Pt will decrease worst pain as reported on NPRS by at least 3 points in order to demonstrate clinically significant reduction in pain.    Baseline  04/17/19: Worst: 6/10; 05/18/19: Worst: 5/10; 06/13/19: worst 5/10    Time  12    Period  Weeks    Status  Partially Met    Target Date  07/10/19      PT LONG TERM GOAL #3   Title  Pt will demonstrate pain-free cervical motion in order to perform IADLs such as driving and household chores without increase in R cervical/R shoulder pain    Baseline  04/17/19: Pain with R cervical rotation, bilateral lateral flexion, and extension; 05/18/19: unchanged; 06/13/19: min pain with L cervical rotation, R  lateral flexion, extension; pain with R cervical rotation and L lateral flexion    Time  12    Period  Weeks    Status  On-going    Target Date  07/10/19      PT LONG TERM GOAL #4   Title  Pt will demonstrate decrease in NDI by at least 19% in order to demonstrate clinically significant reduction in disability related to neck injury/pain    Baseline  04/19/19: 30%; 05/18/19: 18%; 06/13/19: 28%    Time  12    Period  Weeks    Status  On-going    Target Date  07/10/19            Plan - 06/22/19 0840    Clinical Impression Statement  She endorses some relief from soft tissue mobilization and manual cervical traction but doesn't really having any resting pain upon arrival today. She tolerates TDN well without any increase in pain and reports more neck mobility following. Overpressure utilized today with repeated cervical retractions. Pt encouraged to monitor symptoms and follow-up with MD as necessary. She has botox injections scheduled for next week. Will continue to progress manual techniques and move toward graded exercises as pt tolerates especially deep neck flexor strengthening. She will benefit from PT services to address deficits in pain in order to return to full function at home and work with less pain.    Personal Factors and Comorbidities  Age;Behavior Pattern;Comorbidity 1;Comorbidity 2;Comorbidity 3+;Fitness;Past/Current Experience;Profession;Time since onset of injury/illness/exacerbation    Comorbidities  Migraines, depression, anxiety, widespread chronic pain    Examination-Activity Limitations  Caring for Others;Carry;Lift;Sleep    Examination-Participation Restrictions  Cleaning;Community Activity;Interpersonal Relationship    Stability/Clinical Decision Making  Unstable/Unpredictable    Rehab Potential  Fair    Clinical Impairments Affecting Rehab Potential  chronic pain, motivation, multiple treatment modalities    PT Frequency  2x / week    PT Duration  12 weeks  PT  Treatment/Interventions  ADLs/Self Care Home Management;Dry needling;Manual techniques;Therapeutic exercise;Therapeutic activities;Neuromuscular re-education;Aquatic Therapy;Biofeedback;Canalith Repostioning;Cryotherapy;Electrical Stimulation;Iontophoresis 59m/ml Dexamethasone;Moist Heat;Ultrasound;Patient/family education;Passive range of motion;Energy conservation;Vestibular;Joint Manipulations    PT Next Visit Plan  Perform trigger point dry needling and manual techniques until pt can tolerate active strengthening, provide education about insomnia and its relationship to chronic pain, additional chronic pain education    PT Home Exercise Plan  Upper trap, levator, and anterior scalene stretches bilaterally    Consulted and Agree with Plan of Care  Patient       Patient will benefit from skilled therapeutic intervention in order to improve the following deficits and impairments:  Impaired perceived functional ability, Impaired UE functional use, Pain  Visit Diagnosis: Chronic right shoulder pain  Cervicalgia     Problem List Patient Active Problem List   Diagnosis Date Noted  . Postoperative history of checked last year 04/22/2017  . Weakness of left arm 04/22/2017  . Numbness and tingling in left arm 04/22/2017  . Lower back pain 04/02/2016  . Acute stress disorder 02/25/2015  . Anxiety 02/25/2015  . Airway hyperreactivity 02/25/2015  . Bradycardia 02/25/2015  . Hypersomnia 02/25/2015  . Clinical depression 02/25/2015  . Elevated blood sugar 02/25/2015  . 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 Acid reflux 02/25/2015  . 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 H/O disease 02/25/2015  . Hypercholesteremia 02/25/2015  . Headache, migraine 02/25/2015  . Muscle ache 02/25/2015  . Allergic rhinitis, seasonal 02/25/2015  . Avitaminosis D 02/25/2015   JPhillips GroutPT, DPT, GCS  Huprich,Jason 06/22/2019, 9:08 AM  [place] [facility]Phys[facility] HospitalSERV[facility] [address] [address]Phone: [phone]  Fax:  [phone] Name: SADELEIGH BARLETTAMRN: [id number] of Birth: 809-07-1956

## 2019-06-23 ENCOUNTER — Other Ambulatory Visit: Payer: Self-pay | Admitting: Family Medicine

## 2019-06-23 DIAGNOSIS — G43709 Chronic migraine without aura, not intractable, without status migrainosus: Secondary | ICD-10-CM | POA: Diagnosis not present

## 2019-06-23 DIAGNOSIS — R29898 Other symptoms and signs involving the musculoskeletal system: Secondary | ICD-10-CM

## 2019-06-23 DIAGNOSIS — R2689 Other abnormalities of gait and mobility: Secondary | ICD-10-CM | POA: Diagnosis not present

## 2019-06-23 DIAGNOSIS — K219 Gastro-esophageal reflux disease without esophagitis: Secondary | ICD-10-CM | POA: Insufficient documentation

## 2019-06-23 DIAGNOSIS — IMO0002 Reserved for concepts with insufficient information to code with codable children: Secondary | ICD-10-CM | POA: Insufficient documentation

## 2019-06-26 ENCOUNTER — Ambulatory Visit: Payer: 59

## 2019-06-26 ENCOUNTER — Other Ambulatory Visit: Payer: Self-pay

## 2019-06-26 DIAGNOSIS — M542 Cervicalgia: Secondary | ICD-10-CM | POA: Diagnosis not present

## 2019-06-26 DIAGNOSIS — G8929 Other chronic pain: Secondary | ICD-10-CM

## 2019-06-26 DIAGNOSIS — M25511 Pain in right shoulder: Secondary | ICD-10-CM | POA: Diagnosis not present

## 2019-06-26 MED FILL — NURTEC 75 MG TBDP: 75 | 30 days supply | Qty: 16 | Fill #0

## 2019-06-26 MED FILL — ATORVASTATIN 40 MG TABLET: 40 | 90 days supply | Qty: 90 | Fill #1

## 2019-06-26 MED FILL — FLOVENT HFA 110 MCG INHALER: 110 | 30 days supply | Qty: 12 | Fill #5

## 2019-06-26 MED FILL — EMGALITY 120 MG/ML SOAJ: 120 | 30 days supply | Qty: 1 | Fill #5

## 2019-06-26 NOTE — Therapy (Addendum)
[place] MAIN [facility] SERVICES [address] [place], [place], [address] Phone: [phone]   Fax:  [phone]  Physical Therapy Treatment  Patient Details  Name: Rachel Fry MRN: [id number] Date of Birth: 1956/08/20 Referring Provider (PT): Dr. Karlton Lemon   Encounter Date: 06/26/2019  PT End of Session - 06/26/19 1609    Visit Number  12    Number of Visits  25    Date for PT Re-Evaluation  07/10/19    Authorization Type  eval 04/17/19, goals updated 06/13/19    PT Start Time  1605    PT Stop Time  1645    PT Time Calculation (min)  40 min    Activity Tolerance  Patient tolerated treatment well    Behavior During Therapy  [facility] for tasks assessed/performed       Past Medical History:  Diagnosis Date  . Allergy   . Migraines   . Numbness and tingling    left side of body, occasionally right side   . S/P Botox injection     Past Surgical History:  Procedure Laterality Date  . ARM NEUROPLASTY Right 1994 and 1996   for chronic pain  . CESAREAN SECTION    . OVARIAN CYST REMOVAL  1973  . TONSILLECTOMY AND ADENOIDECTOMY    . TUBAL LIGATION  1988    There were no vitals filed for this visit.  Subjective Assessment - 06/26/19 1609    Subjective  Pt reports continued weakness LUE and LLE and headaches. Her MD ordered a brain and cervical MRI which is scheduled for next week. She reports 3/10 R upper quarter pain today. She has botox injections scheduled for tomorrow. Otherwise no specific questions or concerns.    Pertinent History  Pt fractured her R radius in ulna in 1993. She underwent a closed reduction with casting. Following removal of the cast she was unable to move her R wrist. She had R wrist surgery the following year and states that they were able to move her wrist under anesthesia however when awake she was still unable to move her wrist. She underwent a repeat R wrist surgery the following year followed by 1 year of physical  therapy. She states that she had a NCV and multiple testing which didn't reveal any issues. She believes that the pain in her RUE was originating from her neck. She started seeking multiple treatments in 2012 including chiropractic, dry needling, acupuncture, myofascial release for her widespread chronic pain. She states that she saw some improvement at that time. Approximately 2 years ago she started having heaviness and tingling in the L arm and L leg. She states that they have done an MRI of her brain, neck, and thoracic spine without any notable findings or explanations. They have also done NCV studies for both sides without any significant findings. She has had extensive testing without any significant findings to explain her symptoms. She states that the RLE is relatively unaffected. Prior to coming to therapy she was seeing a massage therapist for myofascial release and seeing chiropractor for dry needling and adjustments. She also sees another massage therapist for general massage for head, neck, and back. She has a history of chronic migraines for at least 10 years. She reports that her migraines are L temporal and she has started having some L orbital pain more recently as well with her migraines. She reports that her migraines are constant and are always present generally "at a low level." She  also gets nausea and upset stomach during the migraines and feels like her balance is off. She saw ENT and states that they did vestibular testing with a deficit identified in her R ear. She reports that she worked with vestibular therapy and upon retesting it has "improved which surprised the ENT." Her neurologist believes that the rest of her symptoms in her BUE/LLE are a manifestation of migraines. She receives botox injections every 12 weeks for the migraines by her neurologist. She also gets trigger point injections every 4 weeks as well. Most of the focus from her chiropractor is R upper quadrant (shoulder, R  side of neck). Pt does have occasional L shoulder pain.    Diagnostic tests  negative MRI for cervical, thoracic, brain    Patient Stated Goals  Decrease R shoulder/arm/neck pain    Currently in Pain?  Yes    Pain Score  3     Pain Location  Shoulder    Pain Orientation  Right    Pain Descriptors / Indicators  Aching    Pain Type  Chronic pain          TREATMENT    Manual Therapy  Supine gentle cervical manual traction 20s hold, 10s rest x 3 bouts;  Supine R Upper trap stretch2 x 30s bilaterally  Supine R levator scapulae stretch2 x 30s bilaterally; Supine L lateral flexion stretch for R scalenes 2 x 30s;  CPA cervical joint mobs C2-C7 grade II-III 20s/bouts x 1 bout/level; R UPA cervical joint mobs C2-C7 grade II-III 20s/bouts x 1 bout/level; Cervical retractions 5s hold x 10 with OP from therapist; Extensive soft tissue mobilization toRupper trap, levator, and cervical paraspinals.    Electrical Stimulation  Combination estim (HiVolt)/US for trigger points to R upper trap, levator, and cervical paraspinals. Utilized 160V intensity with HiVolt to achieve twitch response at muscle. [place] parameters as follows: 3.3 Mhz, 1.4 W/cm2, small head with head warmer on. Pt reports decrease in "pulling" sensation at R upper trap with R head turns following treatment.   Pt educated throughout session about proper posture and technique with exercises. Improved exercise technique, movement at target joints, use of target muscles after min to mod verbal, visual, tactile cues.    Pt reports decrease in R shoulder pain at end of session but is unable to rate at this time. Avoided dry needling since she has botox injections scheduled for tomorrow. Instead utilized combination [place] with HiVolt estim for trigger points as well as soft tissue mobilizations. Will continue to progress manual techniques and move toward graded exercises as pt tolerates especially deep neck flexor  strengthening. She will benefit from PT services to address deficits in pain in order to return to full function at home and work with less pain.                       PT Short Term Goals - 06/14/19 1328      PT SHORT TERM GOAL #1   Title  Pt will be independent with HEP in order to improve RUE use and decrease pain in order to improve pain-free function at home and work.    Time  6    Period  Weeks    Status  Achieved    Target Date  05/29/19        PT Long Term Goals - 06/13/19 1611      PT LONG TERM GOAL #1   Title  Pt will decrease quick  DASH score by at least 8% in order to demonstrate clinically significant reduction in disability.    Baseline  04/19/19: 50%; 05/18/19: 9.1%; 06/13/19: 29.5%    Time  12    Period  Weeks    Status  Achieved      PT LONG TERM GOAL #2   Title  Pt will decrease worst pain as reported on NPRS by at least 3 points in order to demonstrate clinically significant reduction in pain.    Baseline  04/17/19: Worst: 6/10; 05/18/19: Worst: 5/10; 06/13/19: worst 5/10    Time  12    Period  Weeks    Status  Partially Met    Target Date  07/10/19      PT LONG TERM GOAL #3   Title  Pt will demonstrate pain-free cervical motion in order to perform IADLs such as driving and household chores without increase in R cervical/R shoulder pain    Baseline  04/17/19: Pain with R cervical rotation, bilateral lateral flexion, and extension; 05/18/19: unchanged; 06/13/19: min pain with L cervical rotation, R lateral flexion, extension; pain with R cervical rotation and L lateral flexion    Time  12    Period  Weeks    Status  On-going    Target Date  07/10/19      PT LONG TERM GOAL #4   Title  Pt will demonstrate decrease in NDI by at least 19% in order to demonstrate clinically significant reduction in disability related to neck injury/pain    Baseline  04/19/19: 30%; 05/18/19: 18%; 06/13/19: 28%    Time  12    Period  Weeks    Status  On-going    Target  Date  07/10/19            Plan - 06/26/19 1610    Clinical Impression Statement  Pt reports decrease in R shoulder pain at end of session but is unable to rate at this time. Avoided dry needling since she has botox injections scheduled for tomorrow. Instead utilized combination [place] with HiVolt estim for trigger points as well as soft tissue mobilizations. Will continue to progress manual techniques and move toward graded exercises as pt tolerates especially deep neck flexor strengthening. She will benefit from PT services to address deficits in pain in order to return to full function at home and work with less pain.    Personal Factors and Comorbidities  Age;Behavior Pattern;Comorbidity 1;Comorbidity 2;Comorbidity 3+;Fitness;Past/Current Experience;Profession;Time since onset of injury/illness/exacerbation    Comorbidities  Migraines, depression, anxiety, widespread chronic pain    Examination-Activity Limitations  Caring for Others;Carry;Lift;Sleep    Examination-Participation Restrictions  Cleaning;Community Activity;Interpersonal Relationship    Stability/Clinical Decision Making  Unstable/Unpredictable    Rehab Potential  Fair    Clinical Impairments Affecting Rehab Potential  chronic pain, motivation, multiple treatment modalities    PT Frequency  2x / week    PT Duration  12 weeks    PT Treatment/Interventions  ADLs/Self Care Home Management;Dry needling;Manual techniques;Therapeutic exercise;Therapeutic activities;Neuromuscular re-education;Aquatic Therapy;Biofeedback;Canalith Repostioning;Cryotherapy;Electrical Stimulation;Iontophoresis 27m/ml Dexamethasone;Moist Heat;Ultrasound;Patient/family education;Passive range of motion;Energy conservation;Vestibular;Joint Manipulations    PT Next Visit Plan  Perform trigger point dry needling and manual techniques until pt can tolerate active strengthening, provide education about insomnia and its relationship to chronic pain, additional chronic  pain education    PT Home Exercise Plan  Upper trap, levator, and anterior scalene stretches bilaterally    Consulted and Agree with Plan of Care  Patient  Patient will benefit from skilled therapeutic intervention in order to improve the following deficits and impairments:  Impaired perceived functional ability, Impaired UE functional use, Pain  Visit Diagnosis: Chronic right shoulder pain  Cervicalgia     Problem List Patient Active Problem List   Diagnosis Date Noted  . Postoperative history of checked last year 04/22/2017  . Weakness of left arm 04/22/2017  . Numbness and tingling in left arm 04/22/2017  . Lower back pain 04/02/2016  . Acute stress disorder 02/25/2015  . Anxiety 02/25/2015  . Airway hyperreactivity 02/25/2015  . Bradycardia 02/25/2015  . Hypersomnia 02/25/2015  . Clinical depression 02/25/2015  . Elevated blood sugar 02/25/2015  . 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 Acid reflux 02/25/2015  . 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 H/O disease 02/25/2015  . Hypercholesteremia 02/25/2015  . Headache, migraine 02/25/2015  . Muscle ache 02/25/2015  . Allergic rhinitis, seasonal 02/25/2015  . Avitaminosis D 02/25/2015     Phillips Grout PT, DPT, GCS  Huprich,Jason 06/27/2019, 8:37 AM  [place] MAIN [facility] SERVICES [address] [place], [place], [address] Phone: [phone]   Fax:  [phone]  Name: Rachel Fry MRN: [id number] Date of Birth: 12/22/1955

## 2019-06-27 DIAGNOSIS — G43709 Chronic migraine without aura, not intractable, without status migrainosus: Secondary | ICD-10-CM | POA: Diagnosis not present

## 2019-06-28 MED FILL — AIMOVIG 140 MG/ML SOAJ: 140 | 30 days supply | Qty: 1 | Fill #0

## 2019-07-04 MED FILL — predniSONE 5 MG TABS: 5 | 30 days supply | Qty: 30 | Fill #1

## 2019-07-04 MED FILL — FROVATRIPTAN SUCC 2.5 MG TA: 2.5 | 30 days supply | Qty: 18 | Fill #2

## 2019-07-04 MED FILL — RIZATRIPTAN BENZOATE 10 MG: 10 | 20 days supply | Qty: 12 | Fill #2

## 2019-07-05 ENCOUNTER — Ambulatory Visit: Payer: 59 | Attending: Internal Medicine

## 2019-07-05 ENCOUNTER — Other Ambulatory Visit: Payer: Self-pay

## 2019-07-05 DIAGNOSIS — M25511 Pain in right shoulder: Secondary | ICD-10-CM | POA: Insufficient documentation

## 2019-07-05 DIAGNOSIS — M542 Cervicalgia: Secondary | ICD-10-CM | POA: Diagnosis not present

## 2019-07-05 DIAGNOSIS — G8929 Other chronic pain: Secondary | ICD-10-CM | POA: Diagnosis not present

## 2019-07-05 NOTE — Therapy (Signed)
[place] MAIN [facility] SERVICES [address] [place], [place], [address] Phone: [phone]   Fax:  [phone]  Physical Therapy Treatment  Patient Details  Name: Rachel Fry MRN: [id number] Date of Birth: [birth year] Referring Provider (PT): Dr. Karlton Lemon   Encounter Date: 07/05/2019  PT End of Session - 07/06/19 0803    Visit Number  13    Number of Visits  25    Date for PT Re-Evaluation  07/10/19    Authorization Type  eval 04/17/19, goals updated 06/13/19    PT Start Time  1600    PT Stop Time  1645    PT Time Calculation (min)  45 min    Activity Tolerance  Patient tolerated treatment well    Behavior During Therapy  [facility] for tasks assessed/performed       Past Medical History:  Diagnosis Date  . Allergy   . Migraines   . Numbness and tingling    left side of body, occasionally right side   . S/P Botox injection     Past Surgical History:  Procedure Laterality Date  . ARM NEUROPLASTY Right 1994 and 1996   for chronic pain  . CESAREAN SECTION    . OVARIAN CYST REMOVAL  1973  . TONSILLECTOMY AND ADENOIDECTOMY    . TUBAL LIGATION  1988    There were no vitals filed for this visit.  Subjective Assessment - 07/05/19 1630    Subjective  Pt reports that her MRI is scheduled for Friday.  She states that she has had some relief this week, stating that her headache is a 1/10 today. Otherwise no specific questions or concerns.    Pertinent History  Pt fractured her R radius in ulna in 1993. She underwent a closed reduction with casting. Following removal of the cast she was unable to move her R wrist. She had R wrist surgery the following year and states that they were able to move her wrist under anesthesia however when awake she was still unable to move her wrist. She underwent a repeat R wrist surgery the following year followed by 1 year of physical therapy. She states that she had a NCV and multiple testing which didn't reveal any  issues. She believes that the pain in her RUE was originating from her neck. She started seeking multiple treatments in 2012 including chiropractic, dry needling, acupuncture, myofascial release for her widespread chronic pain. She states that she saw some improvement at that time. Approximately 2 years ago she started having heaviness and tingling in the L arm and L leg. She states that they have done an MRI of her brain, neck, and thoracic spine without any notable findings or explanations. They have also done NCV studies for both sides without any significant findings. She has had extensive testing without any significant findings to explain her symptoms. She states that the RLE is relatively unaffected. Prior to coming to therapy she was seeing a massage therapist for myofascial release and seeing chiropractor for dry needling and adjustments. She also sees another massage therapist for general massage for head, neck, and back. She has a history of chronic migraines for at least 10 years. She reports that her migraines are L temporal and she has started having some L orbital pain more recently as well with her migraines. She reports that her migraines are constant and are always present generally "at a low level." She also gets nausea and upset stomach during the migraines and  feels like her balance is off. She saw ENT and states that they did vestibular testing with a deficit identified in her R ear. She reports that she worked with vestibular therapy and upon retesting it has "improved which surprised the ENT." Her neurologist believes that the rest of her symptoms in her BUE/LLE are a manifestation of migraines. She receives botox injections every 12 weeks for the migraines by her neurologist. She also gets trigger point injections every 4 weeks as well. Most of the focus from her chiropractor is R upper quadrant (shoulder, R side of neck). Pt does have occasional L shoulder pain.    Diagnostic tests   negative MRI for cervical, thoracic, brain    Patient Stated Goals  Decrease R shoulder/arm/neck pain    Currently in Pain?  Yes    Pain Score  1     Pain Location  Head    Pain Onset  More than a month ago         TREATMENT         Manual Therapy    Manual suboccipital release 2 bouts of 60 seconds  Supine gentle cervical manual traction 20s hold, 10s rest x 3 bouts;      Supine R Upper trap stretch 2 x 30s bilaterally      Supine R levator scapulae stretch 2 x 30s bilaterally;   Supine L lateral flexion stretch for R scalenes 2 x 30s;      CPA cervical joint mobs C2-C7 grade II-III 20s/bouts x 1 bout/level;    R UPA cervical joint mobs C2-C7 grade II-III 20s/bouts x 1 bout/level;     Extensive soft tissue mobilization to R upper trap, levator, and cervical paraspinals.  Increased tension and trigger points noted in L levator today.      There-ex   UBE forward 2 min, backward 2 min (untimed)  Cervical retractions 3s hold x 12    Deep neck flexor exercise 3s hold x10  Standing scapular retractions with red band  x12      Trigger Point Dry Needling (TDN), unbilled Education performed with patient regarding potential benefit of TDN. Reviewed precautions and risks with patient. Extensive time spent with pt to ensure full understanding of TDN risks. Pt provided verbal consent to treatment. TDN performed with 5, 0.30 x 50 single needle placements to R upper trap (2), R levator scapulae (1), R splenius cervicus/capitus (1), R suboccipitals (1) with local twitch response (LTR). Pistoning technique utilized. Improved pain-free motion following intervention.      Pt educated throughout session about proper posture and technique with exercises. Improved exercise technique, movement at target joints, use of target muscles after min to mod verbal, visual, tactile cues.           Pt reports that she has felt relief since her Botox injections, but that she still feels tight and  sore over her levator scapula.  She responded well to extensive STM, joint mobilizations, stretching, and manual traction, reporting a relief in tension and tightness at the end of the session. She also responded well to the addition of cervical retractions, deep neck flexor exercises, and scapular retractions. She will benefit from PT services to address deficits in pain in order to return to full function at home and work with less pain.       PT Short Term Goals - 06/14/19 1328      PT SHORT TERM GOAL #1   Title  Pt will be independent with  HEP in order to improve RUE use and decrease pain in order to improve pain-free function at home and work.    Time  6    Period  Weeks    Status  Achieved    Target Date  05/29/19        PT Long Term Goals - 06/13/19 1611      PT LONG TERM GOAL #1   Title  Pt will decrease quick DASH score by at least 8% in order to demonstrate clinically significant reduction in disability.    Baseline  04/19/19: 50%; 05/18/19: 9.1%; 06/13/19: 29.5%    Time  12    Period  Weeks    Status  Achieved      PT LONG TERM GOAL #2   Title  Pt will decrease worst pain as reported on NPRS by at least 3 points in order to demonstrate clinically significant reduction in pain.    Baseline  04/17/19: Worst: 6/10; 05/18/19: Worst: 5/10; 06/13/19: worst 5/10    Time  12    Period  Weeks    Status  Partially Met    Target Date  07/10/19      PT LONG TERM GOAL #3   Title  Pt will demonstrate pain-free cervical motion in order to perform IADLs such as driving and household chores without increase in R cervical/R shoulder pain    Baseline  04/17/19: Pain with R cervical rotation, bilateral lateral flexion, and extension; 05/18/19: unchanged; 06/13/19: min pain with L cervical rotation, R lateral flexion, extension; pain with R cervical rotation and L lateral flexion    Time  12    Period  Weeks    Status  On-going    Target Date  07/10/19      PT LONG TERM GOAL #4   Title  Pt  will demonstrate decrease in NDI by at least 19% in order to demonstrate clinically significant reduction in disability related to neck injury/pain    Baseline  04/19/19: 30%; 05/18/19: 18%; 06/13/19: 28%    Time  12    Period  Weeks    Status  On-going    Target Date  07/10/19            Plan - 07/06/19 6945    Clinical Impression Statement  Pt reports that she has felt relief since her Botox injections, but that she still feels tight and sore over her levator scapulae.  She responded well to extensive STM, joint mobilizations, stretching, and manual traction, reporting a relief in tension and tightness at the end of the session. She also responded well to the addition of cervical retractions, deep neck flexor exercises, and scapular retractions. She will benefit from PT services to address deficits in pain in order to return to full function at home and work with less pain.    Personal Factors and Comorbidities  Age;Behavior Pattern;Comorbidity 1;Comorbidity 2;Comorbidity 3+;Fitness;Past/Current Experience;Profession;Time since onset of injury/illness/exacerbation    Comorbidities  Migraines, depression, anxiety, widespread chronic pain    Examination-Activity Limitations  Caring for Others;Carry;Lift;Sleep    Examination-Participation Restrictions  Cleaning;Community Activity;Interpersonal Relationship    Stability/Clinical Decision Making  Unstable/Unpredictable    Rehab Potential  Fair    Clinical Impairments Affecting Rehab Potential  chronic pain, motivation, multiple treatment modalities    PT Frequency  2x / week    PT Duration  12 weeks    PT Treatment/Interventions  ADLs/Self Care Home Management;Dry needling;Manual techniques;Therapeutic exercise;Therapeutic activities;Neuromuscular re-education;Aquatic Therapy;Biofeedback;Canalith Repostioning;Cryotherapy;Electrical Stimulation;Iontophoresis 81m/ml Dexamethasone;Moist  Heat;Ultrasound;Patient/family education;Passive range of  motion;Energy conservation;Vestibular;Joint Manipulations    PT Next Visit Plan  Outcome measures, goals, recertification; Perform trigger point dry needling and manual techniques until pt can tolerate active strengthening, provide education about insomnia and its relationship to chronic pain, additional chronic pain education    PT Home Exercise Plan  Upper trap, levator, and anterior scalene stretches bilaterally    Consulted and Agree with Plan of Care  Patient       Patient will benefit from skilled therapeutic intervention in order to improve the following deficits and impairments:  Impaired perceived functional ability, Impaired UE functional use, Pain  Visit Diagnosis: Chronic right shoulder pain  Cervicalgia     Problem List Patient Active Problem List   Diagnosis Date Noted  . Postoperative history of checked last year 04/22/2017  . Weakness of left arm 04/22/2017  . Numbness and tingling in left arm 04/22/2017  . Lower back pain 04/02/2016  . Acute stress disorder 02/25/2015  . Anxiety 02/25/2015  . Airway hyperreactivity 02/25/2015  . Bradycardia 02/25/2015  . Hypersomnia 02/25/2015  . Clinical depression 02/25/2015  . Elevated blood sugar 02/25/2015  . 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 Acid reflux 02/25/2015  . 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 H/O disease 02/25/2015  . Hypercholesteremia 02/25/2015  . Headache, migraine 02/25/2015  . Muscle ache 02/25/2015  . Allergic rhinitis, seasonal 02/25/2015  . Avitaminosis D 02/25/2015   This entire session was performed under direct supervision and direction of a licensed therapist/therapist assistant . I have personally read, edited and approve of the note as written.   Lutricia Horsfall, SPT Huprich,Jason 07/06/2019, 9:46 AM  [place] MAIN [facility] SERVICES [address] [place], [place], [address] Phone: [phone]   Fax:  [phone]  Name: Rachel Fry MRN: [id number] Date of  Birth: July 24, 1956

## 2019-07-06 ENCOUNTER — Ambulatory Visit: Payer: 59

## 2019-07-06 ENCOUNTER — Other Ambulatory Visit: Payer: 59

## 2019-07-07 ENCOUNTER — Ambulatory Visit
Admission: RE | Admit: 2019-07-07 | Discharge: 2019-07-07 | Disposition: A | Discharge status: Home / Self Care | Payer: 59 | Source: Ambulatory Visit | Attending: Family Medicine | Admitting: Family Medicine

## 2019-07-07 ENCOUNTER — Other Ambulatory Visit: Payer: Self-pay

## 2019-07-07 DIAGNOSIS — R29898 Other symptoms and signs involving the musculoskeletal system: Secondary | ICD-10-CM

## 2019-07-07 DIAGNOSIS — R531 Weakness: Secondary | ICD-10-CM | POA: Diagnosis not present

## 2019-07-07 DIAGNOSIS — M50222 Other cervical disc displacement at C5-C6 level: Secondary | ICD-10-CM | POA: Diagnosis not present

## 2019-07-07 DIAGNOSIS — M50221 Other cervical disc displacement at C4-C5 level: Secondary | ICD-10-CM | POA: Diagnosis not present

## 2019-07-07 LAB — POCT I-STAT CREATININE: Creatinine, Ser: 1 mg/dL (ref 0.44–1.00)

## 2019-07-07 MED ORDER — GADOBUTROL 1 MMOL/ML IV SOLN
8.0000 mL | Freq: Once | INTRAVENOUS | Status: AC | PRN
  Administered 2019-07-07: 16:00:00 8 mL via INTRAVENOUS

## 2019-07-10 ENCOUNTER — Other Ambulatory Visit: Payer: Self-pay

## 2019-07-10 ENCOUNTER — Ambulatory Visit: Payer: 59

## 2019-07-10 DIAGNOSIS — M542 Cervicalgia: Secondary | ICD-10-CM | POA: Diagnosis not present

## 2019-07-10 DIAGNOSIS — M25511 Pain in right shoulder: Secondary | ICD-10-CM | POA: Diagnosis not present

## 2019-07-10 DIAGNOSIS — G8929 Other chronic pain: Secondary | ICD-10-CM

## 2019-07-10 NOTE — Therapy (Signed)
[place] MAIN [facility] SERVICES [address] [place], [place], [address] Phone: [phone]   Fax:  [phone]  Physical Therapy Treatment/Recertification  Patient Details  Name: Rachel Fry MRN: [id number] Date of Birth: April 05, 1956 Referring Provider (PT): Dr. Karlton Lemon   Encounter Date: 07/10/2019  PT End of Session - 07/10/19 1603    Visit Number  14    Number of Visits  75    Date for PT Re-Evaluation  10/02/19    Authorization Type  eval 04/17/19, goals updated 07/10/19    PT Start Time  1603    PT Stop Time  1645    PT Time Calculation (min)  42 min    Activity Tolerance  Patient tolerated treatment well    Behavior During Therapy  [facility] for tasks assessed/performed       Past Medical History:  Diagnosis Date  . Allergy   . Migraines   . Numbness and tingling    left side of body, occasionally right side   . S/P Botox injection     Past Surgical History:  Procedure Laterality Date  . ARM NEUROPLASTY Right 1994 and 1996   for chronic pain  . CESAREAN SECTION    . OVARIAN CYST REMOVAL  1973  . TONSILLECTOMY AND ADENOIDECTOMY    . TUBAL LIGATION  1988    There were no vitals filed for this visit.  Subjective Assessment - 07/10/19 1641    Subjective  Pt reports that her MRI went well and that her results were unremarkable.  She reports that she is about 60% better compared to before PT.  She reports that she has been more tender to palpation recently. Otherwise no specific questions or concerns.    Pertinent History  Pt fractured her R radius in ulna in 1993. She underwent a closed reduction with casting. Following removal of the cast she was unable to move her R wrist. She had R wrist surgery the following year and states that they were able to move her wrist under anesthesia however when awake she was still unable to move her wrist. She underwent a repeat R wrist surgery the following year followed by 1 year of physical therapy.  She states that she had a NCV and multiple testing which didn't reveal any issues. She believes that the pain in her RUE was originating from her neck. She started seeking multiple treatments in 2012 including chiropractic, dry needling, acupuncture, myofascial release for her widespread chronic pain. She states that she saw some improvement at that time. Approximately 2 years ago she started having heaviness and tingling in the L arm and L leg. She states that they have done an MRI of her brain, neck, and thoracic spine without any notable findings or explanations. They have also done NCV studies for both sides without any significant findings. She has had extensive testing without any significant findings to explain her symptoms. She states that the RLE is relatively unaffected. Prior to coming to therapy she was seeing a massage therapist for myofascial release and seeing chiropractor for dry needling and adjustments. She also sees another massage therapist for general massage for head, neck, and back. She has a history of chronic migraines for at least 10 years. She reports that her migraines are L temporal and she has started having some L orbital pain more recently as well with her migraines. She reports that her migraines are constant and are always present generally "at a low level." She  also gets nausea and upset stomach during the migraines and feels like her balance is off. She saw ENT and states that they did vestibular testing with a deficit identified in her R ear. She reports that she worked with vestibular therapy and upon retesting it has "improved which surprised the ENT." Her neurologist believes that the rest of her symptoms in her BUE/LLE are a manifestation of migraines. She receives botox injections every 12 weeks for the migraines by her neurologist. She also gets trigger point injections every 4 weeks as well. Most of the focus from her chiropractor is R upper quadrant (shoulder, R side of  neck). Pt does have occasional L shoulder pain.    Diagnostic tests  negative MRI for cervical, thoracic, brain    Patient Stated Goals  Decrease R shoulder/arm/neck pain    Currently in Pain?  Yes    Pain Score  2     Pain Location  Head    Pain Orientation  Right    Pain Descriptors / Indicators  Aching    Pain Type  Chronic pain    Pain Onset  More than a month ago         TREATMENT          Manual Therapy      Manual suboccipital release 2 bouts of 60 seconds    Supine gentle cervical manual traction 20s hold, 10s rest x 3 bouts;        Supine R Upper trap stretch 2 x 30s bilaterally        Supine R levator scapulae stretch 2 x 30s bilaterally;     Supine L lateral flexion stretch for R scalenes 2 x 30s;        CPA cervical joint mobs C2-C7 grade II-III 20s/bouts x 1 bout/level;       Soft tissue mobilization to R upper trap, levator, and cervical paraspinals.  No increased tension noted today.     Trigger Point Dry Needling (TDN), unbilled   Education performed with patient regarding potential benefit of TDN. Reviewed precautions and risks with patient. Extensive time spent with pt to ensure full understanding of TDN risks. Pt provided verbal consent to treatment. TDN performed with 5, 0.30 x 50 single needle placements to R upper trap (2), R levator scapulae (1), R suboccipitals (1), and R cervical multifidi (1) with local twitch response (LTR). Pistoning technique utilized. Improved pain-free motion following intervention.          Pt educated throughout session about proper posture and technique with exercises. Improved exercise technique, movement at target joints, use of target muscles after min to mod verbal, visual, tactile cues.               Pt reports that she feels about 60% better compared to starting therapy.  She has met 1 STG and 2 LTGs to date, making progress towards meeting her other 2 LTGs.  She endorses a 10% on the NDI and a 38.64% on the Quick  DASH, both demonstrating improvement from her initial evaluation.  She continues to endorse her worst pain as a 5/10.  She reports improvement in her pain free range of motion today, stating that she still has difficulty with cervical rotation, especially to the R.  She continues to respond well to extensive STM, joint mobilizations, stretching, manual traction, and TDN, reporting complete relief in tension and tightness at the end of the session.  She will benefit from PT services to address  deficits in pain in order to return to full function at home and work with less pain.          PT Short Term Goals - 07/10/19 1609      PT SHORT TERM GOAL #1   Title  Pt will be independent with HEP in order to improve RUE use and decrease pain in order to improve pain-free function at home and work.    Time  6    Period  Weeks    Status  Achieved    Target Date  05/29/19        PT Long Term Goals - 07/10/19 1610      PT LONG TERM GOAL #1   Title  Pt will decrease quick DASH score by at least 8% in order to demonstrate clinically significant reduction in disability.    Baseline  04/19/19: 50%; 05/18/19: 9.1%; 06/13/19: 29.5%; 07/10/19: 38.64%    Time  12    Period  Weeks    Status  Achieved      PT LONG TERM GOAL #2   Title  Pt will decrease worst pain as reported on NPRS by at least 3 points in order to demonstrate clinically significant reduction in pain.    Baseline  04/17/19: Worst: 6/10; 05/18/19: Worst: 5/10; 06/13/19: worst 5/10; 07/10/19: worst: 5/10    Time  12    Period  Weeks    Status  Partially Met    Target Date  10/02/19      PT LONG TERM GOAL #3   Title  Pt will demonstrate pain-free cervical motion in order to perform IADLs such as driving and household chores without increase in R cervical/R shoulder pain    Baseline  04/17/19: Pain with R cervical rotation, bilateral lateral flexion, and extension; 05/18/19: unchanged; 06/13/19: min pain with L cervical rotation, R lateral  flexion, extension; pain with R cervical rotation and L lateral flexion; 07/10/19: pt states that she has some pain with R rotation with driving    Time  12    Period  Weeks    Status  Partially Met    Target Date  10/02/19      PT LONG TERM GOAL #4   Title  Pt will demonstrate decrease in NDI by at least 19% in order to demonstrate clinically significant reduction in disability related to neck injury/pain    Baseline  04/19/19: 30%; 05/18/19: 18%; 06/13/19: 28%; 07/10/19: 10%    Time  12    Period  Weeks    Status  Achieved            Plan - 07/11/19 0813    Clinical Impression Statement  Pt reports that she feels about 60% better compared to starting therapy.  She has met 1 STG and 2 LTGs to date, making progress towards meeting her other 2 LTGs.  She endorses a 10% on the NDI and a 38.64% on the Quick DASH, both demonstrating improvement from her initial evaluation.  She continues to endorse her worst pain as a 5/10.  She reports improvement in her pain free range of motion today, stating that she still has difficulty with cervical rotation, especially to the R.  She continues to respond well to extensive STM, joint mobilizations, stretching, manual traction, and TDN, reporting complete relief in tension and tightness at the end of the session.  She will benefit from PT services to address deficits in pain in order to return to full function at home  and work with less pain.    Personal Factors and Comorbidities  Age;Behavior Pattern;Comorbidity 1;Comorbidity 2;Comorbidity 3+;Fitness;Past/Current Experience;Profession;Time since onset of injury/illness/exacerbation    Comorbidities  Migraines, depression, anxiety, widespread chronic pain    Examination-Activity Limitations  Caring for Others;Carry;Lift;Sleep    Examination-Participation Restrictions  Cleaning;Community Activity;Interpersonal Relationship    Stability/Clinical Decision Making  Unstable/Unpredictable    Rehab Potential  Fair     Clinical Impairments Affecting Rehab Potential  chronic pain, motivation, multiple treatment modalities    PT Frequency  2x / week    PT Duration  12 weeks    PT Treatment/Interventions  ADLs/Self Care Home Management;Dry needling;Manual techniques;Therapeutic exercise;Therapeutic activities;Neuromuscular re-education;Aquatic Therapy;Biofeedback;Canalith Repostioning;Cryotherapy;Electrical Stimulation;Iontophoresis 73m/ml Dexamethasone;Moist Heat;Ultrasound;Patient/family education;Passive range of motion;Energy conservation;Vestibular;Joint Manipulations    PT Next Visit Plan  Perform trigger point dry needling and manual techniques until pt can tolerate active strengthening, additional chronic pain education    PT Home Exercise Plan  Upper trap, levator, and anterior scalene stretches bilaterally    Consulted and Agree with Plan of Care  Patient       Patient will benefit from skilled therapeutic intervention in order to improve the following deficits and impairments:  Impaired perceived functional ability, Impaired UE functional use, Pain  Visit Diagnosis: Chronic right shoulder pain - Plan: PT plan of care cert/re-cert  Cervicalgia - Plan: PT plan of care cert/re-cert     Problem List Patient Active Problem List   Diagnosis Date Noted  . Postoperative history of checked last year 04/22/2017  . Weakness of left arm 04/22/2017  . Numbness and tingling in left arm 04/22/2017  . Lower back pain 04/02/2016  . Acute stress disorder 02/25/2015  . Anxiety 02/25/2015  . Airway hyperreactivity 02/25/2015  . Bradycardia 02/25/2015  . Hypersomnia 02/25/2015  . Clinical depression 02/25/2015  . Elevated blood sugar 02/25/2015  . 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 Acid reflux 02/25/2015  . 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 H/O disease 02/25/2015  . Hypercholesteremia 02/25/2015  . Headache, migraine 02/25/2015  . Muscle ache 02/25/2015  . Allergic rhinitis, seasonal 02/25/2015  .  Avitaminosis D 02/25/2015    This entire session was performed under direct supervision and direction of a licensed therapist/therapist assistant . I have personally read, edited and approve of the note as written.   TLutricia Horsfall SPT JPhillips GroutPT, DPT, GCS  Huprich,Jason 07/11/2019, 9:50 AM  [place] [facility] [address] [address]Phone: [phone]  Fax:  [phone] Name: Rachel FONSMRN: [id number] of Birth: 8December 16, 1957

## 2019-07-12 ENCOUNTER — Other Ambulatory Visit: Payer: Self-pay

## 2019-07-12 ENCOUNTER — Ambulatory Visit: Payer: 59

## 2019-07-12 DIAGNOSIS — M25511 Pain in right shoulder: Secondary | ICD-10-CM | POA: Diagnosis not present

## 2019-07-12 DIAGNOSIS — M542 Cervicalgia: Secondary | ICD-10-CM

## 2019-07-12 DIAGNOSIS — G8929 Other chronic pain: Secondary | ICD-10-CM

## 2019-07-12 NOTE — Therapy (Signed)
[place] MAIN [facility] SERVICES [address] [place], [place], [address] Phone: [phone]   Fax:  [phone]  Physical Therapy Treatment  Patient Details  Name: Rachel Fry MRN: [id number] Date of Birth: December 02, 1955 Referring Provider (PT): Dr. Karlton Lemon   Encounter Date: 07/12/2019  PT End of Session - 07/12/19 1723    Visit Number  15    Number of Visits  49    Date for PT Re-Evaluation  10/02/19    Authorization Type  eval 04/17/19, goals updated 07/10/19    PT Start Time  1600    PT Stop Time  1645    PT Time Calculation (min)  45 min    Activity Tolerance  Patient tolerated treatment well    Behavior During Therapy  [facility] [place] for tasks assessed/performed       Past Medical History:  Diagnosis Date  . Allergy   . Migraines   . Numbness and tingling    left side of body, occasionally right side   . S/P Botox injection     Past Surgical History:  Procedure Laterality Date  . ARM NEUROPLASTY Right 1994 and 1996   for chronic pain  . CESAREAN SECTION    . OVARIAN CYST REMOVAL  1973  . TONSILLECTOMY AND ADENOIDECTOMY    . TUBAL LIGATION  1988    There were no vitals filed for this visit.  Subjective Assessment - 07/12/19 1612    Subjective  Pt states that she had relief for a day after last session. She reports more tension in R side compared to L, but only with certain motions.  No new questions or concerns at this time.    Pertinent History  Pt fractured her R radius in ulna in 1993. She underwent a closed reduction with casting. Following removal of the cast she was unable to move her R wrist. She had R wrist surgery the following year and states that they were able to move her wrist under anesthesia however when awake she was still unable to move her wrist. She underwent a repeat R wrist surgery the following year followed by 1 year of physical therapy. She states that she had a NCV and multiple testing which didn't reveal any  issues. She believes that the pain in her RUE was originating from her neck. She started seeking multiple treatments in 2012 including chiropractic, dry needling, acupuncture, myofascial release for her widespread chronic pain. She states that she saw some improvement at that time. Approximately 2 years ago she started having heaviness and tingling in the L arm and L leg. She states that they have done an MRI of her brain, neck, and thoracic spine without any notable findings or explanations. They have also done NCV studies for both sides without any significant findings. She has had extensive testing without any significant findings to explain her symptoms. She states that the RLE is relatively unaffected. Prior to coming to therapy she was seeing a massage therapist for myofascial release and seeing chiropractor for dry needling and adjustments. She also sees another massage therapist for general massage for head, neck, and back. She has a history of chronic migraines for at least 10 years. She reports that her migraines are L temporal and she has started having some L orbital pain more recently as well with her migraines. She reports that her migraines are constant and are always present generally "at a low level." She also gets nausea and upset stomach during the  migraines and feels like her balance is off. She saw ENT and states that they did vestibular testing with a deficit identified in her R ear. She reports that she worked with vestibular therapy and upon retesting it has "improved which surprised the ENT." Her neurologist believes that the rest of her symptoms in her BUE/LLE are a manifestation of migraines. She receives botox injections every 12 weeks for the migraines by her neurologist. She also gets trigger point injections every 4 weeks as well. Most of the focus from her chiropractor is R upper quadrant (shoulder, R side of neck). Pt does have occasional L shoulder pain.    Diagnostic tests   negative MRI for cervical, thoracic, brain    Patient Stated Goals  Decrease R shoulder/arm/neck pain    Pain Onset  --         TREATMENT          Manual Therapy      Manual suboccipital release 2 bouts of 45 seconds    Supine gentle cervical manual traction 20s hold, 10s rest x 3 bouts;        Supine R upper trap stretch 2 x 30s bilaterally        Supine R levator scapulae stretch 2 x 30s bilaterally;     Supine L scalene stretch  2 x 30s;    Soft tissue mobilization to R upper trap, levator, and cervical paraspinals.  No increased tension noted today.     Ther-ex  UBE 2.5 min forward, 2.5 min backwards  Supine cervical retractions into a pillow x15 with a 5 sec hold  Deep neck flexor exercise x10 with 3 sec hold  4 way neck exercises in supine, sidelying, prone x10 each direction  Standing Y's for lower trap activation - x10 with yellow non-latex tband  Standing rows x10   Seated press ups x5      Pt educated throughout session about proper posture and technique with exercises. Improved exercise technique, movement at target joints, use of target muscles after min to mod verbal, visual, tactile cues.               Pt displays good motivation throughout today's session.  She tolerated the addition of cervical, lower traps, lattisimus dorsi, and mid traps, without complaints of pain or irritation.  She responded well to STM, stretching, and manual traction, stating that she had more mobility or pain at the end of the session.  She will benefit from PT services to address deficits in pain in order to return to full function at home and work with less pain.           PT Short Term Goals - 07/10/19 1609      PT SHORT TERM GOAL #1   Title  Pt will be independent with HEP in order to improve RUE use and decrease pain in order to improve pain-free function at home and work.    Time  6    Period  Weeks    Status  Achieved    Target Date  05/29/19         PT Long Term Goals - 07/10/19 1610      PT LONG TERM GOAL #1   Title  Pt will decrease quick DASH score by at least 8% in order to demonstrate clinically significant reduction in disability.    Baseline  04/19/19: 50%; 05/18/19: 9.1%; 06/13/19: 29.5%; 07/10/19: 38.64%    Time  12    Period  Weeks    Status  Achieved      PT LONG TERM GOAL #2   Title  Pt will decrease worst pain as reported on NPRS by at least 3 points in order to demonstrate clinically significant reduction in pain.    Baseline  04/17/19: Worst: 6/10; 05/18/19: Worst: 5/10; 06/13/19: worst 5/10; 07/10/19: worst: 5/10    Time  12    Period  Weeks    Status  Partially Met    Target Date  10/02/19      PT LONG TERM GOAL #3   Title  Pt will demonstrate pain-free cervical motion in order to perform IADLs such as driving and household chores without increase in R cervical/R shoulder pain    Baseline  04/17/19: Pain with R cervical rotation, bilateral lateral flexion, and extension; 05/18/19: unchanged; 06/13/19: min pain with L cervical rotation, R lateral flexion, extension; pain with R cervical rotation and L lateral flexion; 07/10/19: pt states that she has some pain with R rotation with driving    Time  12    Period  Weeks    Status  Partially Met    Target Date  10/02/19      PT LONG TERM GOAL #4   Title  Pt will demonstrate decrease in NDI by at least 19% in order to demonstrate clinically significant reduction in disability related to neck injury/pain    Baseline  04/19/19: 30%; 05/18/19: 18%; 06/13/19: 28%; 07/10/19: 10%    Time  12    Period  Weeks    Status  Achieved            Plan - 07/12/19 1727    Clinical Impression Statement  Pt displays good motivation throughout today's session.  She tolerated the addition of cervical, lower traps, lattisimus dorsi, and mid traps, without complaints of pain or irritation.  She responded well to STM, stretching, and manual traction, stating that she had more mobility or  pain at the end of the session.  She will benefit from PT services to address deficits in pain in order to return to full function at home and work with less pain.    Personal Factors and Comorbidities  Age;Behavior Pattern;Comorbidity 1;Comorbidity 2;Comorbidity 3+;Fitness;Past/Current Experience;Profession;Time since onset of injury/illness/exacerbation    Comorbidities  Migraines, depression, anxiety, widespread chronic pain    Examination-Activity Limitations  Caring for Others;Carry;Lift;Sleep    Examination-Participation Restrictions  Cleaning;Community Activity;Interpersonal Relationship    Stability/Clinical Decision Making  Unstable/Unpredictable    Rehab Potential  Fair    Clinical Impairments Affecting Rehab Potential  chronic pain, motivation, multiple treatment modalities    PT Frequency  2x / week    PT Duration  12 weeks    PT Treatment/Interventions  ADLs/Self Care Home Management;Dry needling;Manual techniques;Therapeutic exercise;Therapeutic activities;Neuromuscular re-education;Aquatic Therapy;Biofeedback;Canalith Repostioning;Cryotherapy;Electrical Stimulation;Iontophoresis 38m/ml Dexamethasone;Moist Heat;Ultrasound;Patient/family education;Passive range of motion;Energy conservation;Vestibular;Joint Manipulations    PT Next Visit Plan  Perform trigger point dry needling and manual techniques until pt can tolerate active strengthening, additional chronic pain education    PT Home Exercise Plan  Upper trap, levator, and anterior scalene stretches bilaterally    Consulted and Agree with Plan of Care  Patient       Patient will benefit from skilled therapeutic intervention in order to improve the following deficits and impairments:  Impaired perceived functional ability, Impaired UE functional use, Pain  Visit Diagnosis: Chronic right shoulder pain  Cervicalgia     Problem List Patient Active Problem List   Diagnosis Date  Noted  . Postoperative history of checked last  year 04/22/2017  . Weakness of left arm 04/22/2017  . Numbness and tingling in left arm 04/22/2017  . Lower back pain 04/02/2016  . Acute stress disorder 02/25/2015  . Anxiety 02/25/2015  . Airway hyperreactivity 02/25/2015  . Bradycardia 02/25/2015  . Hypersomnia 02/25/2015  . Clinical depression 02/25/2015  . Elevated blood sugar 02/25/2015  . 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 Acid reflux 02/25/2015  . 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 H/O disease 02/25/2015  . Hypercholesteremia 02/25/2015  . Headache, migraine 02/25/2015  . Muscle ache 02/25/2015  . Allergic rhinitis, seasonal 02/25/2015  . Avitaminosis D 02/25/2015    This entire session was performed under direct supervision and direction of a licensed therapist/therapist assistant . I have personally read, edited and approve of the note as written.   Lutricia Horsfall, SPT Phillips Grout PT, DPT, GCS  Huprich,Jason 07/13/2019, 3:15 PM  [place] MAIN [facility] SERVICES [address] [place], [place], [address] Phone: [phone]   Fax:  [phone]  Name: Rachel Fry MRN: [id number] Date of Birth: November 20, 1955

## 2019-07-17 ENCOUNTER — Other Ambulatory Visit: Payer: Self-pay

## 2019-07-17 ENCOUNTER — Ambulatory Visit: Payer: 59

## 2019-07-17 DIAGNOSIS — M542 Cervicalgia: Secondary | ICD-10-CM | POA: Diagnosis not present

## 2019-07-17 DIAGNOSIS — M25511 Pain in right shoulder: Secondary | ICD-10-CM | POA: Diagnosis not present

## 2019-07-17 DIAGNOSIS — G8929 Other chronic pain: Secondary | ICD-10-CM | POA: Diagnosis not present

## 2019-07-17 NOTE — Therapy (Signed)
[place] MAIN [facility] SERVICES [address] [place], [place], [address] Phone: [phone]   Fax:  [phone]  Physical Therapy Treatment  Patient Details  Name: Rachel Fry MRN: [id number] Date of Birth: 01/25/1956 Referring Provider (PT): Dr. Karlton Lemon   Encounter Date: 07/17/2019  PT End of Session - 07/17/19 1603    Visit Number  16    Number of Visits  75    Date for PT Re-Evaluation  10/02/19    Authorization Type  eval 04/17/19, goals updated 07/10/19    PT Start Time  1600    PT Stop Time  1645    PT Time Calculation (min)  45 min    Activity Tolerance  Patient tolerated treatment well    Behavior During Therapy  [facility] for tasks assessed/performed       Past Medical History:  Diagnosis Date  . Allergy   . Migraines   . Numbness and tingling    left side of body, occasionally right side   . S/P Botox injection     Past Surgical History:  Procedure Laterality Date  . ARM NEUROPLASTY Right 1994 and 1996   for chronic pain  . CESAREAN SECTION    . OVARIAN CYST REMOVAL  1973  . TONSILLECTOMY AND ADENOIDECTOMY    . TUBAL LIGATION  1988    There were no vitals filed for this visit.  Subjective Assessment - 07/17/19 1606    Subjective  Pt states she has started Amovig injectables once a month and took her first one this weekend. She a 3/10 tenderness in her R levator and upper trap today.  No new questions or concerns at this time.    Pertinent History  Pt fractured her R radius in ulna in 1993. She underwent a closed reduction with casting. Following removal of the cast she was unable to move her R wrist. She had R wrist surgery the following year and states that they were able to move her wrist under anesthesia however when awake she was still unable to move her wrist. She underwent a repeat R wrist surgery the following year followed by 1 year of physical therapy. She states that she had a NCV and multiple testing which didn't  reveal any issues. She believes that the pain in her RUE was originating from her neck. She started seeking multiple treatments in 2012 including chiropractic, dry needling, acupuncture, myofascial release for her widespread chronic pain. She states that she saw some improvement at that time. Approximately 2 years ago she started having heaviness and tingling in the L arm and L leg. She states that they have done an MRI of her brain, neck, and thoracic spine without any notable findings or explanations. They have also done NCV studies for both sides without any significant findings. She has had extensive testing without any significant findings to explain her symptoms. She states that the RLE is relatively unaffected. Prior to coming to therapy she was seeing a massage therapist for myofascial release and seeing chiropractor for dry needling and adjustments. She also sees another massage therapist for general massage for head, neck, and back. She has a history of chronic migraines for at least 10 years. She reports that her migraines are L temporal and she has started having some L orbital pain more recently as well with her migraines. She reports that her migraines are constant and are always present generally "at a low level." She also gets nausea and upset stomach  during the migraines and feels like her balance is off. She saw ENT and states that they did vestibular testing with a deficit identified in her R ear. She reports that she worked with vestibular therapy and upon retesting it has "improved which surprised the ENT." Her neurologist believes that the rest of her symptoms in her BUE/LLE are a manifestation of migraines. She receives botox injections every 12 weeks for the migraines by her neurologist. She also gets trigger point injections every 4 weeks as well. Most of the focus from her chiropractor is R upper quadrant (shoulder, R side of neck). Pt does have occasional L shoulder pain.    Diagnostic  tests  negative MRI for cervical, thoracic, brain    Patient Stated Goals  Decrease R shoulder/arm/neck pain    Currently in Pain?  Yes    Pain Score  3     Pain Location  Shoulder    Pain Orientation  Right    Pain Descriptors / Indicators  Tightness    Pain Type  Chronic pain    Pain Onset  More than a month ago    Pain Frequency  Intermittent        TREATMENT              Manual Therapy         Supine gentle cervical manual traction 45s hold x 3 bouts;            Supine B upper trap stretch 2 x 30s bilaterally            Supine B levator scapulae stretch 2 x 30s bilaterally;         Supine B cervical rotation stretch  2 x 30s bilaterally;          Extensive soft tissue mobilization to B upper trap, levator scapula, and cervical paraspinals.  Increased tension noted in L levators.        Trigger Point Dry Needling (TDN) Education performed with patient regarding potential benefit of TDN. Reviewed precautions and risks with patient. Reviewed special precautions/risks over lung fields which include pneumothorax. Reviewed signs and symptoms of pneumothorax and advised pt to go to ER immediately if these symptoms develop advise them of dry needling treatment. Extensive time spent with pt to ensure full understanding of TDN risks. Pt provided verbal consent to treatment. TDN performed to bilateral upper trap and cervical multifidi with 4, 0.30 x 50 single needle placements with local twitch response (LTR). Pistoning technique utilized.       Ther-ex     Supine cervical retractions into a pillow x20 with a 5 sec hold      Deep neck flexor exercise x10 with 3 sec hold        Standing Y's for lower trap activation - x10 with yellow non-latex tband      Standing rows 2x10       Seated press ups x10         Pt educated throughout session about proper posture and technique with exercises. Improved exercise technique, movement at target joints, use of target muscles  after min to mod verbal, visual, tactile cues.                  Pt displays good motivation throughout today's session.  She continues to tolerate the addition of cervical and scapular exercises with no increase in pain noted.  She continues to respond well to STM, stretching, TDN, and manual traction, with an  aching feeling from TDN, but a relief of tension and pain at the end of the session.  She will benefit from PT services to address deficits in pain in order to return to full function at home and work with less pain.             PT Short Term Goals - 07/10/19 1609      PT SHORT TERM GOAL #1   Title  Pt will be independent with HEP in order to improve RUE use and decrease pain in order to improve pain-free function at home and work.    Time  6    Period  Weeks    Status  Achieved    Target Date  05/29/19        PT Long Term Goals - 07/10/19 1610      PT LONG TERM GOAL #1   Title  Pt will decrease quick DASH score by at least 8% in order to demonstrate clinically significant reduction in disability.    Baseline  04/19/19: 50%; 05/18/19: 9.1%; 06/13/19: 29.5%; 07/10/19: 38.64%    Time  12    Period  Weeks    Status  Achieved      PT LONG TERM GOAL #2   Title  Pt will decrease worst pain as reported on NPRS by at least 3 points in order to demonstrate clinically significant reduction in pain.    Baseline  04/17/19: Worst: 6/10; 05/18/19: Worst: 5/10; 06/13/19: worst 5/10; 07/10/19: worst: 5/10    Time  12    Period  Weeks    Status  Partially Met    Target Date  10/02/19      PT LONG TERM GOAL #3   Title  Pt will demonstrate pain-free cervical motion in order to perform IADLs such as driving and household chores without increase in R cervical/R shoulder pain    Baseline  04/17/19: Pain with R cervical rotation, bilateral lateral flexion, and extension; 05/18/19: unchanged; 06/13/19: min pain with L cervical rotation, R lateral flexion, extension; pain with R cervical rotation  and L lateral flexion; 07/10/19: pt states that she has some pain with R rotation with driving    Time  12    Period  Weeks    Status  Partially Met    Target Date  10/02/19      PT LONG TERM GOAL #4   Title  Pt will demonstrate decrease in NDI by at least 19% in order to demonstrate clinically significant reduction in disability related to neck injury/pain    Baseline  04/19/19: 30%; 05/18/19: 18%; 06/13/19: 28%; 07/10/19: 10%    Time  12    Period  Weeks    Status  Achieved            Plan - 07/17/19 1652    Clinical Impression Statement  Pt displays good motivation throughout today's session.  She continues to tolerate the addition of cervical and scapular exercises with no increase in pain noted.  She continues to respond well to STM, stretching, TDN, and manual traction, with an aching feeling from TDN, but a relief of tension and pain at the end of the session.  She will benefit from PT services to address deficits in pain in order to return to full function at home and work with less pain.    Personal Factors and Comorbidities  Age;Behavior Pattern;Comorbidity 1;Comorbidity 2;Comorbidity 3+;Fitness;Past/Current Experience;Profession;Time since onset of injury/illness/exacerbation    Comorbidities  Migraines, depression, anxiety, widespread  chronic pain    Examination-Activity Limitations  Caring for Others;Carry;Lift;Sleep    Examination-Participation Restrictions  Cleaning;Community Activity;Interpersonal Relationship    Stability/Clinical Decision Making  Unstable/Unpredictable    Rehab Potential  Fair    Clinical Impairments Affecting Rehab Potential  chronic pain, motivation, multiple treatment modalities    PT Frequency  2x / week    PT Duration  12 weeks    PT Treatment/Interventions  ADLs/Self Care Home Management;Dry needling;Manual techniques;Therapeutic exercise;Therapeutic activities;Neuromuscular re-education;Aquatic Therapy;Biofeedback;Canalith  Repostioning;Cryotherapy;Electrical Stimulation;Iontophoresis 40m/ml Dexamethasone;Moist Heat;Ultrasound;Patient/family education;Passive range of motion;Energy conservation;Vestibular;Joint Manipulations    PT Next Visit Plan  Perform trigger point dry needling and manual techniques until pt can tolerate active strengthening, additional chronic pain education    PT Home Exercise Plan  Upper trap, levator, and anterior scalene stretches bilaterally    Consulted and Agree with Plan of Care  Patient       Patient will benefit from skilled therapeutic intervention in order to improve the following deficits and impairments:  Impaired perceived functional ability, Impaired UE functional use, Pain  Visit Diagnosis: Chronic right shoulder pain  Cervicalgia     Problem List Patient Active Problem List   Diagnosis Date Noted  . Postoperative history of checked last year 04/22/2017  . Weakness of left arm 04/22/2017  . Numbness and tingling in left arm 04/22/2017  . Lower back pain 04/02/2016  . Acute stress disorder 02/25/2015  . Anxiety 02/25/2015  . Airway hyperreactivity 02/25/2015  . Bradycardia 02/25/2015  . Hypersomnia 02/25/2015  . Clinical depression 02/25/2015  . Elevated blood sugar 02/25/2015  . 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 Acid reflux 02/25/2015  . 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 H/O disease 02/25/2015  . Hypercholesteremia 02/25/2015  . Headache, migraine 02/25/2015  . Muscle ache 02/25/2015  . Allergic rhinitis, seasonal 02/25/2015  . Avitaminosis D 02/25/2015     TLutricia Horsfall SPT JPhillips GroutPT, DPT, GCS  Huprich,Jason 07/18/2019, 10:44 AM  [place] [facility] [address] [address]Phone: [phone]  Fax:  [phone] Name: Rachel ACHEYMRN: [id number] of Birth: [birth year]

## 2019-07-19 ENCOUNTER — Other Ambulatory Visit: Payer: Self-pay

## 2019-07-19 ENCOUNTER — Ambulatory Visit: Payer: 59

## 2019-07-19 DIAGNOSIS — M542 Cervicalgia: Secondary | ICD-10-CM | POA: Diagnosis not present

## 2019-07-19 DIAGNOSIS — M25511 Pain in right shoulder: Secondary | ICD-10-CM | POA: Diagnosis not present

## 2019-07-19 DIAGNOSIS — G8929 Other chronic pain: Secondary | ICD-10-CM | POA: Diagnosis not present

## 2019-07-19 MED FILL — SPIRONOLACTONE 25 MG TABS: 25 | 90 days supply | Qty: 30 | Fill #0

## 2019-07-19 NOTE — Therapy (Signed)
[place] MAIN [facility] SERVICES [place], [place], [address] Phone: [phone]   Fax:  [phone]  Physical Therapy Treatment  Patient Details  Name: Rachel Fry MRN: [id number] Date of Birth: July 29, 1956 Referring Provider (PT): Dr. Karlton Lemon   Encounter Date: 07/19/2019  PT End of Session - 07/19/19 1718    Visit Number  17    Number of Visits  81    Date for PT Re-Evaluation  10/02/19    Authorization Type  eval 04/17/19, goals updated 07/10/19    PT Start Time  1600    PT Stop Time  1645    PT Time Calculation (min)  45 min    Activity Tolerance  Patient tolerated treatment well    Behavior During Therapy  [facility] for tasks assessed/performed       Past Medical History:  Diagnosis Date  . Allergy   . Migraines   . Numbness and tingling    left side of body, occasionally right side   . S/P Botox injection     Past Surgical History:  Procedure Laterality Date  . ARM NEUROPLASTY Right 1994 and 1996   for chronic pain  . CESAREAN SECTION    . OVARIAN CYST REMOVAL  1973  . TONSILLECTOMY AND ADENOIDECTOMY    . TUBAL LIGATION  1988    There were no vitals filed for this visit.  Subjective Assessment - 07/19/19 1607    Subjective  Pt states she is doing well today, but that yesterday her L side felt heavy.   She a 3/10 tenderness in her R levator and upper trap today with end range of motion.  No new questions or concerns at this time.    Pertinent History  Pt fractured her R radius in ulna in 1993. She underwent a closed reduction with casting. Following removal of the cast she was unable to move her R wrist. She had R wrist surgery the following year and states that they were able to move her wrist under anesthesia however when awake she was still unable to move her wrist. She underwent a repeat R wrist surgery the following year followed by 1 year of physical therapy. She states that she had a NCV and multiple testing which  didn't reveal any issues. She believes that the pain in her RUE was originating from her neck. She started seeking multiple treatments in 2012 including chiropractic, dry needling, acupuncture, myofascial release for her widespread chronic pain. She states that she saw some improvement at that time. Approximately 2 years ago she started having heaviness and tingling in the L arm and L leg. She states that they have done an MRI of her brain, neck, and thoracic spine without any notable findings or explanations. They have also done NCV studies for both sides without any significant findings. She has had extensive testing without any significant findings to explain her symptoms. She states that the RLE is relatively unaffected. Prior to coming to therapy she was seeing a massage therapist for myofascial release and seeing chiropractor for dry needling and adjustments. She also sees another massage therapist for general massage for head, neck, and back. She has a history of chronic migraines for at least 10 years. She reports that her migraines are L temporal and she has started having some L orbital pain more recently as well with her migraines. She reports that her migraines are constant and are always present generally "at a low level." She also  gets nausea and upset stomach during the migraines and feels like her balance is off. She saw ENT and states that they did vestibular testing with a deficit identified in her R ear. She reports that she worked with vestibular therapy and upon retesting it has "improved which surprised the ENT." Her neurologist believes that the rest of her symptoms in her BUE/LLE are a manifestation of migraines. She receives botox injections every 12 weeks for the migraines by her neurologist. She also gets trigger point injections every 4 weeks as well. Most of the focus from her chiropractor is R upper quadrant (shoulder, R side of neck). Pt does have occasional L shoulder pain.     Diagnostic tests  negative MRI for cervical, thoracic, brain    Patient Stated Goals  Decrease R shoulder/arm/neck pain    Currently in Pain?  Yes    Pain Score  2     Pain Location  Shoulder    Pain Orientation  Right    Pain Descriptors / Indicators  Tightness    Pain Type  Chronic pain    Pain Onset  More than a month ago         TREATMENT             Manual Therapy           Supine gentle cervical manual traction 30s hold x 4 bouts;           Supine B upper trap stretch 2 x 30s bilaterally           Supine B levator scapulae stretch 2 x 30s bilaterally;        Supine B cervical rotation stretch  2 x 30s bilaterally;         Extensive soft tissue mobilization to B upper trap, levator scapula, and cervical paraspinals.  No increased muscle tone noted when compared bilaterally.             Ther-ex       Supine cervical retractions into a pillow x20 with a 5 sec hold       Deep neck flexor exercise x10 with 3 sec hold         4 way neck exercises 2x10 in -neck extension, flexion, and side bending against gravity in supine, sidelying, and prone        Pt educated throughout session about proper posture and technique with exercises. Improved exercise technique, movement at target joints, use of target muscles after min verbal, visual, tactile cues.                   Pt displays good motivation throughout today's session.  She continues require less cueing during exercises, but still requires cueing for correct technique with deep neck flexor exercises.  She continues to respond well to [facility], manual traction, and stretching.  Will continue to progress therapeutic exercises as tolerated.  She will benefit from PT services to address deficits in pain in order to return to full function at home and work with less pain.              PT Short Term Goals - 07/10/19 1609      PT SHORT TERM GOAL #1   Title  Pt will be independent with HEP in order to improve RUE use and  decrease pain in order to improve pain-free function at home and work.    Time  6    Period  Weeks  Status  Achieved    Target Date  05/29/19        PT Long Term Goals - 07/10/19 1610      PT LONG TERM GOAL #1   Title  Pt will decrease quick DASH score by at least 8% in order to demonstrate clinically significant reduction in disability.    Baseline  04/19/19: 50%; 05/18/19: 9.1%; 06/13/19: 29.5%; 07/10/19: 38.64%    Time  12    Period  Weeks    Status  Achieved      PT LONG TERM GOAL #2   Title  Pt will decrease worst pain as reported on NPRS by at least 3 points in order to demonstrate clinically significant reduction in pain.    Baseline  04/17/19: Worst: 6/10; 05/18/19: Worst: 5/10; 06/13/19: worst 5/10; 07/10/19: worst: 5/10    Time  12    Period  Weeks    Status  Partially Met    Target Date  10/02/19      PT LONG TERM GOAL #3   Title  Pt will demonstrate pain-free cervical motion in order to perform IADLs such as driving and household chores without increase in R cervical/R shoulder pain    Baseline  04/17/19: Pain with R cervical rotation, bilateral lateral flexion, and extension; 05/18/19: unchanged; 06/13/19: min pain with L cervical rotation, R lateral flexion, extension; pain with R cervical rotation and L lateral flexion; 07/10/19: pt states that she has some pain with R rotation with driving    Time  12    Period  Weeks    Status  Partially Met    Target Date  10/02/19      PT LONG TERM GOAL #4   Title  Pt will demonstrate decrease in NDI by at least 19% in order to demonstrate clinically significant reduction in disability related to neck injury/pain    Baseline  04/19/19: 30%; 05/18/19: 18%; 06/13/19: 28%; 07/10/19: 10%    Time  12    Period  Weeks    Status  Achieved            Plan - 07/19/19 1717    Clinical Impression Statement  Pt displays good motivation throughout today's session.  She continues require less cueing during exercises, but still requires cueing  for correct technique with deep neck flexor exercises.  She continues to respond well to [facility], manual traction, and stretching.  Will continue to progress therapeutic exercises as tolerated.  She will benefit from PT services to address deficits in pain in order to return to full function at home and work with less pain.    Personal Factors and Comorbidities  Age;Behavior Pattern;Comorbidity 1;Comorbidity 2;Comorbidity 3+;Fitness;Past/Current Experience;Profession;Time since onset of injury/illness/exacerbation    Comorbidities  Migraines, depression, anxiety, widespread chronic pain    Examination-Activity Limitations  Caring for Others;Carry;Lift;Sleep    Examination-Participation Restrictions  Cleaning;Community Activity;Interpersonal Relationship    Stability/Clinical Decision Making  Unstable/Unpredictable    Rehab Potential  Fair    Clinical Impairments Affecting Rehab Potential  chronic pain, motivation, multiple treatment modalities    PT Frequency  2x / week    PT Duration  12 weeks    PT Treatment/Interventions  ADLs/Self Care Home Management;Dry needling;Manual techniques;Therapeutic exercise;Therapeutic activities;Neuromuscular re-education;Aquatic Therapy;Biofeedback;Canalith Repostioning;Cryotherapy;Electrical Stimulation;Iontophoresis 77m/ml Dexamethasone;Moist Heat;Ultrasound;Patient/family education;Passive range of motion;Energy conservation;Vestibular;Joint Manipulations    PT Next Visit Plan  Perform trigger point dry needling and manual techniques until pt can tolerate active strengthening, additional chronic pain education    PT Home  Exercise Plan  Upper trap, levator, and anterior scalene stretches bilaterally    Consulted and Agree with Plan of Care  Patient       Patient will benefit from skilled therapeutic intervention in order to improve the following deficits and impairments:  Impaired perceived functional ability, Impaired UE functional use, Pain  Visit  Diagnosis: Chronic right shoulder pain  Cervicalgia     Problem List Patient Active Problem List   Diagnosis Date Noted  . Postoperative history of checked last year 04/22/2017  . Weakness of left arm 04/22/2017  . Numbness and tingling in left arm 04/22/2017  . Lower back pain 04/02/2016  . Acute stress disorder 02/25/2015  . Anxiety 02/25/2015  . Airway hyperreactivity 02/25/2015  . Bradycardia 02/25/2015  . Hypersomnia 02/25/2015  . Clinical depression 02/25/2015  . Elevated blood sugar 02/25/2015  . 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 Acid reflux 02/25/2015  . 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 H/O disease 02/25/2015  . Hypercholesteremia 02/25/2015  . Headache, migraine 02/25/2015  . Muscle ache 02/25/2015  . Allergic rhinitis, seasonal 02/25/2015  . Avitaminosis D 02/25/2015    This entire session was performed under direct supervision and direction of a licensed therapist/therapist assistant . I have personally read, edited and approve of the note as written.   Lutricia Horsfall, SPT Phillips Grout PT, DPT, GCS  Huprich,Jason 07/20/2019, 11:55 AM  [place] MAIN [facility] SERVICES [address] [place], [place], [address] Phone: [phone]   Fax:  [phone]  Name: Rachel Fry MRN: [id number] Date of Birth: 1956-05-03

## 2019-07-20 MED FILL — NURTEC 75 MG TBDP: 75 | 30 days supply | Qty: 8 | Fill #1

## 2019-07-24 ENCOUNTER — Ambulatory Visit: Payer: 59

## 2019-07-26 ENCOUNTER — Ambulatory Visit: Payer: 59

## 2019-07-31 ENCOUNTER — Ambulatory Visit: Payer: 59

## 2019-07-31 MED FILL — BOTOX 200 UNITS VIAL: 200 | 84 days supply | Qty: 1 | Fill #1

## 2019-08-02 ENCOUNTER — Ambulatory Visit: Payer: 59

## 2019-08-02 MED FILL — FLOVENT HFA 110 MCG INHALER: 110 | 30 days supply | Qty: 12 | Fill #6

## 2019-08-02 MED FILL — AIMOVIG 140 MG/ML SOAJ: 140 | 30 days supply | Qty: 1 | Fill #1

## 2019-08-07 ENCOUNTER — Ambulatory Visit: Payer: 59

## 2019-08-09 ENCOUNTER — Ambulatory Visit: Payer: 59

## 2019-08-14 ENCOUNTER — Ambulatory Visit: Payer: 59

## 2019-08-14 MED FILL — predniSONE 5 MG TABS: 5 | 30 days supply | Qty: 30 | Fill #2

## 2019-08-14 MED FILL — VIT D2 1.25 MG (50,000 UNIT: 1.25 MG | 84 days supply | Qty: 12 | Fill #0

## 2019-08-14 MED FILL — NURTEC 75 MG TBDP: 75 | 30 days supply | Qty: 8 | Fill #2

## 2019-08-14 MED FILL — SPIRONOLACTONE 25 MG TABS: 25 | 30 days supply | Qty: 30 | Fill #1

## 2019-08-15 MED FILL — ZAFIRLUKAST 10 MG TAB: 10 | 90 days supply | Qty: 180 | Fill #0

## 2019-08-16 ENCOUNTER — Ambulatory Visit: Payer: 59

## 2019-08-16 MED FILL — EZETIMIBE 10 MG TABS: 10 | 90 days supply | Qty: 90 | Fill #1

## 2019-08-16 MED FILL — ATORVASTATIN 20 MG TABLET: 20 | 90 days supply | Qty: 90 | Fill #1

## 2019-08-19 ENCOUNTER — Encounter (INDEPENDENT_AMBULATORY_CARE_PROVIDER_SITE_OTHER): Payer: Self-pay

## 2019-08-21 ENCOUNTER — Ambulatory Visit: Payer: 59

## 2019-08-22 DIAGNOSIS — M542 Cervicalgia: Secondary | ICD-10-CM | POA: Diagnosis not present

## 2019-08-23 ENCOUNTER — Ambulatory Visit: Payer: 59

## 2019-08-24 ENCOUNTER — Ambulatory Visit: Payer: 59

## 2019-08-28 MED FILL — AIMOVIG 140 MG/ML SOAJ: 140 | 30 days supply | Qty: 1 | Fill #2

## 2019-08-31 MED FILL — AMLODIPINE 2.5 MG TABLET: 2.5 | 90 days supply | Qty: 90 | Fill #0

## 2019-09-04 MED FILL — FLOVENT HFA 110 MCG INHALER: 110 | 30 days supply | Qty: 12 | Fill #7

## 2019-09-11 MED FILL — LEVOTHYROXINE 50 MCG TABLET: 50 | 90 days supply | Qty: 90 | Fill #0

## 2019-09-11 MED FILL — SPIRONOLACTONE 25 MG TABS: 25 | 30 days supply | Qty: 30 | Fill #2

## 2019-09-15 MED FILL — predniSONE 5 MG TABS: 5 | 30 days supply | Qty: 30 | Fill #3

## 2019-09-19 ENCOUNTER — Ambulatory Visit
Admission: RE | Admit: 2019-09-19 | Discharge: 2019-09-19 | Disposition: A | Discharge status: Home / Self Care | Payer: 59 | Source: Ambulatory Visit | Attending: Internal Medicine | Admitting: Internal Medicine

## 2019-09-19 ENCOUNTER — Other Ambulatory Visit: Payer: Self-pay | Admitting: Internal Medicine

## 2019-09-19 DIAGNOSIS — R0602 Shortness of breath: Secondary | ICD-10-CM

## 2019-09-26 ENCOUNTER — Other Ambulatory Visit
Admission: RE | Admit: 2019-09-26 | Discharge: 2019-09-26 | Disposition: A | Discharge status: Home / Self Care | Payer: 59 | Source: Ambulatory Visit | Attending: Internal Medicine | Admitting: Internal Medicine

## 2019-09-26 DIAGNOSIS — I10 Essential (primary) hypertension: Secondary | ICD-10-CM | POA: Insufficient documentation

## 2019-09-26 DIAGNOSIS — Z0184 Encounter for antibody response examination: Secondary | ICD-10-CM | POA: Diagnosis not present

## 2019-09-26 DIAGNOSIS — E039 Hypothyroidism, unspecified: Secondary | ICD-10-CM | POA: Insufficient documentation

## 2019-09-26 DIAGNOSIS — E785 Hyperlipidemia, unspecified: Secondary | ICD-10-CM | POA: Diagnosis not present

## 2019-09-26 LAB — COMPREHENSIVE METABOLIC PANEL
ALT: 21 U/L (ref 0–44)
AST: 17 U/L (ref 15–41)
Albumin: 4.5 g/dL (ref 3.5–5.0)
Alkaline Phosphatase: 84 U/L (ref 38–126)
Anion gap: 11 (ref 5–15)
BUN: 14 mg/dL (ref 8–23)
CO2: 24 mmol/L (ref 22–32)
Calcium: 10.4 mg/dL — ABNORMAL HIGH (ref 8.9–10.3)
Chloride: 106 mmol/L (ref 98–111)
Creatinine, Ser: 0.78 mg/dL (ref 0.44–1.00)
GFR calc Af Amer: >60 mL/min (ref >60)
GFR calc non Af Amer: >60 mL/min (ref >60)
Glucose, Bld: 101 mg/dL — ABNORMAL HIGH (ref 70–99)
Potassium: 3.8 mmol/L (ref 3.5–5.1)
Sodium: 141 mmol/L (ref 135–145)
Total Bilirubin: 1 mg/dL (ref 0.3–1.2)
Total Protein: 7.4 g/dL (ref 6.5–8.1)

## 2019-09-26 LAB — T4, FREE: Free T4: 1.01 ng/dL (ref 0.61–1.12)

## 2019-09-26 LAB — TSH: TSH: 1.359 u[IU]/mL (ref 0.350–4.500)

## 2019-09-27 DIAGNOSIS — G43709 Chronic migraine without aura, not intractable, without status migrainosus: Secondary | ICD-10-CM | POA: Diagnosis not present

## 2019-09-27 LAB — NMR, LIPOPROFILE
Cholesterol, Total: 168 mg/dL (ref 100–199)
HDL Cholesterol by NMR: 59 mg/dL (ref >39)
HDL Particle Number: 42.8 umol/L (ref >=30.5)
LDL Particle Number: 1138 nmol/L — ABNORMAL HIGH (ref <1000)
LDL Size: 21.3 nm (ref >20.5)
LDL-C (NIH Calc): 88 mg/dL (ref 0–99)
LP-IR Score: 56 — ABNORMAL HIGH (ref <=45)
Small LDL Particle Number: 501 nmol/L (ref <=527)
Triglycerides by NMR: 116 mg/dL (ref 0–149)

## 2019-09-27 LAB — MISC LABCORP TEST (SEND OUT)
Labcorp test code: 164034
Labcorp test code: 164072

## 2019-09-27 LAB — T3, FREE: T3, Free: 2.7 pg/mL (ref 2.0–4.4)

## 2019-09-27 LAB — SAR COV2 SEROLOGY (COVID19)AB(IGG),IA: SARS-CoV-2 Ab, IgG: UNDETERMINED — AB

## 2019-09-27 MED FILL — RIZATRIPTAN BENZOATE 10 MG: 10 | 30 days supply | Qty: 18 | Fill #0

## 2019-09-27 MED FILL — ZONISAMIDE 25 MG CAPSULE: 25 | 36 days supply | Qty: 90 | Fill #0

## 2019-09-27 MED FILL — FROVATRIPTAN SUCC 2.5 MG TA: 2.5 | 60 days supply | Qty: 36 | Fill #0

## 2019-09-28 LAB — LIPOPROTEIN A (LPA): Lipoprotein (a): 229.8 nmol/L — ABNORMAL HIGH (ref <75.0)

## 2019-09-28 MED FILL — AIMOVIG 140 MG/ML SOAJ: 140 | 30 days supply | Qty: 1 | Fill #3

## 2019-10-05 DIAGNOSIS — U071 COVID-19: Secondary | ICD-10-CM | POA: Diagnosis not present

## 2019-10-05 DIAGNOSIS — I10 Essential (primary) hypertension: Secondary | ICD-10-CM | POA: Diagnosis not present

## 2019-10-05 DIAGNOSIS — H9201 Otalgia, right ear: Secondary | ICD-10-CM | POA: Diagnosis not present

## 2019-10-05 MED FILL — AMOX-CLAV 875-125 MG TABLET: 875-125 | 14 days supply | Qty: 28 | Fill #0

## 2019-10-05 MED FILL — CIPROFLOXACIN-DEXAMETHASONE: 0.3-0.1 | 18 days supply | Qty: 8 | Fill #0

## 2019-10-05 MED FILL — FLUCONAZOLE 100 MG TAB: 100 | 14 days supply | Qty: 14 | Fill #0

## 2019-10-05 MED FILL — FLOVENT HFA 110 MCG INHALER: 110 | 30 days supply | Qty: 12 | Fill #8

## 2019-10-05 MED FILL — CloNIDine HCL 0.1 MG TAB: 0.1 | 90 days supply | Qty: 90 | Fill #0

## 2019-10-09 MED FILL — SPIRONOLACTONE 25 MG TABS: 25 | 30 days supply | Qty: 30 | Fill #3

## 2019-10-09 MED FILL — predniSONE 5 MG TABS: 5 | 30 days supply | Qty: 30 | Fill #0

## 2019-10-10 ENCOUNTER — Other Ambulatory Visit: Payer: Self-pay | Admitting: Internal Medicine

## 2019-10-11 ENCOUNTER — Other Ambulatory Visit: Payer: Self-pay | Admitting: Internal Medicine

## 2019-10-11 DIAGNOSIS — N6310 Unspecified lump in the right breast, unspecified quadrant: Secondary | ICD-10-CM

## 2019-10-12 ENCOUNTER — Other Ambulatory Visit: Payer: Self-pay | Admitting: Internal Medicine

## 2019-10-12 DIAGNOSIS — N6310 Unspecified lump in the right breast, unspecified quadrant: Secondary | ICD-10-CM

## 2019-10-22 MED FILL — SPIRONOLACTONE 25 MG TABS: 25 | 90 days supply | Qty: 180 | Fill #0

## 2019-10-23 MED FILL — AMLODIPINE 2.5 MG TABLET: 2.5 | 90 days supply | Qty: 180 | Fill #0

## 2019-10-23 MED FILL — BOTOX 200 UNITS VIAL: 200 | 84 days supply | Qty: 1 | Fill #2

## 2019-10-24 ENCOUNTER — Ambulatory Visit
Admission: RE | Admit: 2019-10-24 | Discharge: 2019-10-24 | Disposition: A | Discharge status: Home / Self Care | Payer: 59 | Source: Ambulatory Visit | Attending: Internal Medicine | Admitting: Internal Medicine

## 2019-10-24 DIAGNOSIS — N6489 Other specified disorders of breast: Secondary | ICD-10-CM | POA: Diagnosis not present

## 2019-10-24 DIAGNOSIS — R928 Other abnormal and inconclusive findings on diagnostic imaging of breast: Secondary | ICD-10-CM | POA: Diagnosis not present

## 2019-10-24 DIAGNOSIS — Z803 Family history of malignant neoplasm of breast: Secondary | ICD-10-CM | POA: Diagnosis not present

## 2019-10-24 DIAGNOSIS — N6310 Unspecified lump in the right breast, unspecified quadrant: Secondary | ICD-10-CM

## 2019-10-25 DIAGNOSIS — I10 Essential (primary) hypertension: Secondary | ICD-10-CM | POA: Insufficient documentation

## 2019-10-25 DIAGNOSIS — M791 Myalgia, unspecified site: Secondary | ICD-10-CM | POA: Diagnosis not present

## 2019-10-25 DIAGNOSIS — M542 Cervicalgia: Secondary | ICD-10-CM | POA: Diagnosis not present

## 2019-10-25 MED FILL — AIMOVIG 140 MG/ML SOAJ: 140 | 30 days supply | Qty: 1 | Fill #4

## 2019-10-29 ENCOUNTER — Telehealth: Payer: 59 | Admitting: Physician Assistant

## 2019-10-29 DIAGNOSIS — T50905A Adverse effect of unspecified drugs, medicaments and biological substances, initial encounter: Secondary | ICD-10-CM

## 2019-10-29 DIAGNOSIS — R21 Rash and other nonspecific skin eruption: Secondary | ICD-10-CM | POA: Diagnosis not present

## 2019-10-29 MED ORDER — PREDNISONE 10 MG (21) PO TBPK: ORAL_TABLET | ORAL | 0 refills | Status: DC

## 2019-10-29 NOTE — Progress Notes (Signed)
Message sent to patient requesting further input regarding current symptoms. Awaiting patient response.  

## 2019-10-29 NOTE — Addendum Note (Signed)
Addended by: Brunetta Jeans on: 10/29/2019 01:18 PM   Modules accepted: Orders

## 2019-10-29 NOTE — Progress Notes (Signed)
   Directions for 6 day taper: Day 1: 2 tablets before breakfast, 1 after both lunch & dinner and 2 at bedtime Day 2: 1 tab before breakfast, 1 after both lunch & dinner and 2 at bedtime Day 3: 1 tab at each meal & 1 at bedtime Day 4: 1 tab at breakfast, 1 at lunch, 1 at bedtime Day 5: 1 tab at breakfast & 1 tab at bedtime Day 6: 1 tab at breakfast

## 2019-10-29 NOTE — Progress Notes (Signed)
I have spent 5 minutes in review of e-visit questionnaire, review and updating patient chart, medical decision making and response to patient.   Zakary Kimura Cody Kerianne Gurr, PA-C    

## 2019-10-29 NOTE — Progress Notes (Signed)
E Visit for Rash  We are sorry that you are not feeling well. Here is how we plan to help!   Based on what you shared with me you may have an allergic reaction.  I am glad you have stopped the offending medication per the previous provider's recommendations. I wan t you to continue following their instructions. I am wanting to start you on a steroid taper (see instructions below) to calm things down. I do see, however, a prior reaction to Flonase which is a nasal steroid. Can you tell me more about what happened with this so we can determine what is safe to give? Can take a non-drowsy Claritin or Xyzal to also help. If not improving, you need assessment in person.    HOME CARE:   Take cool showers and avoid direct sunlight.  Apply cool compress or wet dressings.  Take a bath in an oatmeal bath.  Sprinkle content of one Aveeno packet under running faucet with comfortably warm water.  Bathe for 15-20 minutes, 1-2 times daily.  Pat dry with a towel. Do not rub the rash.  Use hydrocortisone cream.  Take an antihistamine like Benadryl for widespread rashes that itch.  The adult dose of Benadryl is 25-50 mg by mouth 4 times daily.  Caution:  This type of medication may cause sleepiness.  Do not drink alcohol, drive, or operate dangerous machinery while taking antihistamines.  Do not take these medications if you have prostate enlargement.  Read package instructions thoroughly on all medications that you take.  GET HELP RIGHT AWAY IF:   Symptoms don't go away after treatment.  Severe itching that persists.  If you rash spreads or swells.  If you rash begins to smell.  If it blisters and opens or develops a yellow-brown crust.  You develop a fever.  You have a sore throat.  You become short of breath.  MAKE SURE YOU:  Understand these instructions. Will watch your condition. Will get help right away if you are not doing well or get worse.  Thank you for choosing an  e-visit. Your e-visit answers were reviewed by a board certified advanced clinical practitioner to complete your personal care plan. Depending upon the condition, your plan could have included both over the counter or prescription medications. Please review your pharmacy choice. Be sure that the pharmacy you have chosen is open so that you can pick up your prescription now.  If there is a problem you may message your provider in [place] to have the prescription routed to another pharmacy. Your safety is important to [place]. If you have drug allergies check your prescription carefully.  For the next 24 hours, you can use MyChart to ask questions about today's visit, request a non-urgent call back, or ask for a work or school excuse from your e-visit provider. You will get an email in the next two days asking about your experience. I hope that your e-visit has been valuable and will speed your recovery.

## 2019-11-06 MED FILL — FLOVENT HFA 110 MCG INHALER: 110 | 90 days supply | Qty: 36 | Fill #9

## 2019-11-06 MED FILL — ZAFIRLUKAST 10 MG TAB: 10 | 90 days supply | Qty: 180 | Fill #1

## 2019-11-10 DIAGNOSIS — L503 Dermatographic urticaria: Secondary | ICD-10-CM | POA: Diagnosis not present

## 2019-11-10 MED FILL — EPINEPHRINE 0.3 MG AUTO-INJ: 0.3 | 10 days supply | Qty: 2 | Fill #0

## 2019-11-14 MED FILL — EZETIMIBE 10 MG TABS: 10 | 90 days supply | Qty: 90 | Fill #2

## 2019-11-14 MED FILL — ATORVASTATIN 20 MG TABLET: 20 | 90 days supply | Qty: 90 | Fill #2

## 2019-11-20 ENCOUNTER — Ambulatory Visit
Admission: RE | Admit: 2019-11-20 | Discharge: 2019-11-20 | Disposition: A | Discharge status: Home / Self Care | Payer: 59 | Source: Ambulatory Visit | Attending: Internal Medicine | Admitting: Internal Medicine

## 2019-11-20 ENCOUNTER — Other Ambulatory Visit: Payer: Self-pay | Admitting: Internal Medicine

## 2019-11-20 DIAGNOSIS — R1084 Generalized abdominal pain: Secondary | ICD-10-CM

## 2019-11-20 DIAGNOSIS — R109 Unspecified abdominal pain: Secondary | ICD-10-CM | POA: Diagnosis not present

## 2019-11-22 ENCOUNTER — Other Ambulatory Visit (HOSPITAL_COMMUNITY): Payer: Self-pay | Admitting: Otolaryngology

## 2019-11-22 DIAGNOSIS — J301 Allergic rhinitis due to pollen: Secondary | ICD-10-CM | POA: Diagnosis not present

## 2019-11-22 DIAGNOSIS — J305 Allergic rhinitis due to food: Secondary | ICD-10-CM | POA: Diagnosis not present

## 2019-11-22 DIAGNOSIS — J4520 Mild intermittent asthma, uncomplicated: Secondary | ICD-10-CM | POA: Diagnosis not present

## 2019-11-22 DIAGNOSIS — M791 Myalgia, unspecified site: Secondary | ICD-10-CM | POA: Diagnosis not present

## 2019-11-22 DIAGNOSIS — L508 Other urticaria: Secondary | ICD-10-CM | POA: Diagnosis not present

## 2019-11-22 DIAGNOSIS — M542 Cervicalgia: Secondary | ICD-10-CM | POA: Diagnosis not present

## 2019-11-22 MED FILL — EPINEPHRINE 0.3 MG AUTO-INJ: 0.3 | 10 days supply | Qty: 2 | Fill #0

## 2019-11-22 MED FILL — ALBUTEROL SULFATE HFA 108 (: 108 (90 BAS | 90 days supply | Qty: 54 | Fill #0

## 2019-11-22 MED FILL — hydrOXYzine HCL 10 MG TABS: 10 | 20 days supply | Qty: 60 | Fill #0

## 2019-11-23 MED FILL — VIT D2 1.25 MG (50,000 UNIT: 1.25 MG | 84 days supply | Qty: 12 | Fill #1

## 2019-11-23 MED FILL — predniSONE 5 MG TABS: 5 | 30 days supply | Qty: 30 | Fill #1

## 2019-11-28 MED FILL — AIMOVIG 140 MG/ML SOAJ: 140 | 30 days supply | Qty: 1 | Fill #5

## 2019-11-29 MED FILL — CloNIDine HCL 0.1 MG TAB: 0.1 | 90 days supply | Qty: 180 | Fill #0

## 2019-12-04 MED FILL — LEVOTHYROXINE 50 MCG TABLET: 50 | 30 days supply | Qty: 30 | Fill #1

## 2019-12-08 DIAGNOSIS — L821 Other seborrheic keratosis: Secondary | ICD-10-CM | POA: Diagnosis not present

## 2019-12-08 DIAGNOSIS — D2372 Other benign neoplasm of skin of left lower limb, including hip: Secondary | ICD-10-CM | POA: Diagnosis not present

## 2019-12-08 DIAGNOSIS — L309 Dermatitis, unspecified: Secondary | ICD-10-CM | POA: Diagnosis not present

## 2019-12-20 DIAGNOSIS — U071 COVID-19: Secondary | ICD-10-CM | POA: Diagnosis not present

## 2019-12-20 MED FILL — IVERMECTIN 3 MG TABLET: 3 | 5 days supply | Qty: 25 | Fill #0

## 2019-12-29 MED FILL — AIMOVIG 140 MG/ML SOAJ: 140 | 30 days supply | Qty: 1 | Fill #0

## 2020-01-01 MED FILL — predniSONE 5 MG TABS: 5 | 30 days supply | Qty: 30 | Fill #2

## 2020-01-03 DIAGNOSIS — G43709 Chronic migraine without aura, not intractable, without status migrainosus: Secondary | ICD-10-CM | POA: Diagnosis not present

## 2020-01-03 MED FILL — LEVOTHYROXINE 50 MCG TABLET: 50 | 90 days supply | Qty: 90 | Fill #0

## 2020-01-03 MED FILL — FROVATRIPTAN SUCC 2.5 MG TA: 2.5 | 30 days supply | Qty: 18 | Fill #0

## 2020-01-03 MED FILL — RIZATRIPTAN BENZOATE 10 MG: 10 | 30 days supply | Qty: 18 | Fill #0

## 2020-01-03 MED FILL — NURTEC 75 MG TBDP: 75 | 60 days supply | Qty: 16 | Fill #0

## 2020-01-19 DIAGNOSIS — H35373 Puckering of macula, bilateral: Secondary | ICD-10-CM | POA: Diagnosis not present

## 2020-01-20 MED FILL — SPIRONOLACTONE 25 MG TABS: 25 | 90 days supply | Qty: 180 | Fill #1

## 2020-01-25 ENCOUNTER — Other Ambulatory Visit: Payer: Self-pay

## 2020-01-25 ENCOUNTER — Encounter: Payer: Self-pay | Admitting: Allergy & Immunology

## 2020-01-25 ENCOUNTER — Ambulatory Visit (INDEPENDENT_AMBULATORY_CARE_PROVIDER_SITE_OTHER): Payer: 59 | Admitting: Allergy & Immunology

## 2020-01-25 VITALS — BP 192/120 | HR 92 | Temp 96.8°F | Resp 18 | Ht 65.5 in | Wt 169.6 lb

## 2020-01-25 DIAGNOSIS — Z7189 Other specified counseling: Secondary | ICD-10-CM

## 2020-01-25 DIAGNOSIS — R231 Pallor: Secondary | ICD-10-CM

## 2020-01-25 DIAGNOSIS — Z7185 Encounter for immunization safety counseling: Secondary | ICD-10-CM

## 2020-01-25 DIAGNOSIS — T50Z95D Adverse effect of other vaccines and biological substances, subsequent encounter: Secondary | ICD-10-CM

## 2020-01-25 NOTE — Progress Notes (Signed)
NEW PATIENT  Date of Service/Encounter:  01/25/20  Referring provider: [name], MD   Assessment:   Adverse effect of vaccine  Livedo reticularis  Vaccine counseling  Plan/Recommendations:   1. Vaccine reaction - Testing today was negative, including the challenge to the Miralax (PEG). - I think that you will tolerate the COVID vaccine well. - There is currently no definite skin testing available for the Pfizer and Moderna COVID vaccines and data is limited however, there has been a concern regarding sensitivity to PEG in patients who had anaphylactic reactions to the COVID-19 vaccine. - Skin testing was negative to Miralax (source of PEG 3350), methylprednisolone acetate (source of PEG 3350), and triamcinolone acetonide (source of polysorbate-80). - Ideally, after a negative skin testing a test dose of the vaccine is given in the office, but as we do not have access to the vaccine we were unable to do that part. - Call us when you receive the vaccine to give [place] an update.  2. Post COVID syndrome - We are going to refer to the post [employer] clinic. - I think this will be very helpful for you.  3. Return in about 3 months (around 04/25/2020). This can be an in-person, a virtual Webex or a telephone follow up visit.   Subjective:   Rachel Fry is a 64 y.o. female presenting today for evaluation of  Chief Complaint  Patient presents with  . Rash    Rachel Fry has a history of the following: Patient Active Problem List   Diagnosis Date Noted  . Postoperative history of checked last year 04/22/2017  . Weakness of left arm 04/22/2017  . Numbness and tingling in left arm 04/22/2017  . Lower back pain 04/02/2016  . Acute stress disorder 02/25/2015  . Anxiety 02/25/2015  . Airway hyperreactivity 02/25/2015  . Bradycardia 02/25/2015  . Hypersomnia 02/25/2015  . Clinical depression 02/25/2015  . Elevated blood sugar 02/25/2015  . 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  Acid reflux 02/25/2015  . 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 H/O disease 02/25/2015  . Hypercholesteremia 02/25/2015  . Headache, migraine 02/25/2015  . Muscle ache 02/25/2015  . Allergic rhinitis, seasonal 02/25/2015  . Avitaminosis D 02/25/2015    History obtained from: chart review and patient.  Rachel Fry was referred by [name], MD.     Rachel Fry is a 64 y.o. female presenting for an evaluation of rashes and pruritis.  She contracted COVID19 in September 2020. She was working at [employer] in [employer]. She developed a cough. Her husband had a cough two weeks before she got sick. He was positive and then she was negative. She was out of work for two weeks. She has had SOB that has been persistent. She is having chronic fatigue at this time as well with falling asleep when she gets home from work. She is unsure of when the rash developed, but she thinks that it first became prominent on January 1st, 2021.   Even prior to the COVID, she had chronic migraines. She sees Dr. Hansel Feinstein at [facility] in [place]. They were trying a new medication - zonisamide - with a sulfar derivative. Rachel Fry has a history of a sulfa allergy, so she was nervous about taking it. She had a ramp up to the maintenance dosing, but once she got to the second week, she started having this rash. She was placed on prednisone without any improvement in the rash. It was thought that she needed to  do a longer taper. So she did a 12 day taper. Then she had a second 12 day taper. This all did nothing.   She does have an ENT appointment who recommended Pepcid without any improvement in her symptoms. She saw Dr. Evorn Gong in [place] (Dermatology), who recommended cetirizine 20mg  BID. She did helpthis for htree days with theout improvement. She was on hydroxyzine without improvement. She was told that she could do a biopsy but she has not done this yet. Rachel Fry is open to a biopsy but wanted to get our opinion  instead.  All of these problems seem to have developed after her COVID19 infection in September.   She does have a reaction to the flu vaccine. She has itching and swelling with some wheezing to the flu vaccine. She does do the egg-free vaccine and does better with that. She typically premedicates with acetaminophen and Benadryl.   She has done colonoscopies without a problem. But she does react to Metamucil. But the Miralax is fine with her. She has been without her antihistamines for 3-4 days.    She does have a remote history of allergic rhinitis. She was on shots for nearly ten years when she was younger.   Otherwise, there is no history of other atopic diseases, including asthma, food allergies, drug allergies, stinging insect allergies, eczema, urticaria or contact dermatitis. There is no significant infectious history. Vaccinations are up to date.     Past Medical History: Patient Active Problem List   Diagnosis Date Noted  . Postoperative history of checked last year 04/22/2017  . Weakness of left arm 04/22/2017  . Numbness and tingling in left arm 04/22/2017  . Lower back pain 04/02/2016  . Acute stress disorder 02/25/2015  . Anxiety 02/25/2015  . Airway hyperreactivity 02/25/2015  . Bradycardia 02/25/2015  . Hypersomnia 02/25/2015  . Clinical depression 02/25/2015  . Elevated blood sugar 02/25/2015  . 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 Acid reflux 02/25/2015  . 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 H/O disease 02/25/2015  . Hypercholesteremia 02/25/2015  . Headache, migraine 02/25/2015  . Muscle ache 02/25/2015  . Allergic rhinitis, seasonal 02/25/2015  . Avitaminosis D 02/25/2015    Medication List:  Allergies as of 01/25/2020      Reactions   Flonase  [fluticasone Propionate] Other (See Comments)   Latex Itching   Codeine    Rash/vomiting   Fish Oil Itching   Sulfa Antibiotics    abdominal pain      Medication List       Accurate as of January 25, 2020 11:59 PM. If you  have any questions, ask your nurse or doctor.        Aimovig 70 MG/ML Soaj Generic drug: Erenumab-aooe Inject into the skin every 30 (thirty) days.   ANTIOXIDANT PO Take 1 tablet by mouth daily. Flame Rx   atorvastatin 20 MG tablet Commonly known as: LIPITOR TAKE 1 TABLET (20 MG TOTAL) BY MOUTH DAILY.   botulinum toxin Type A 100 units Solr injection Commonly known as: BOTOX Botox - Historical Medication  once every 3 months for migraines Active   butalbital-aspirin-caffeine 50-325-40 MG capsule Commonly known as: FIORINAL   cetirizine 10 MG tablet Commonly known as: ZYRTEC Take by mouth.   diphenhydrAMINE 50 MG tablet Commonly known as: BENADRYL Take 1 tablet (50 mg total) by mouth at bedtime as needed for up to 7 days for allergies.   Emgality 120 MG/ML Soaj Generic drug: Galcanezumab-gnlm Inject into the skin.  EPINEPHrine 0.3 mg/0.3 mL Soaj injection Commonly known as: EPI-PEN 0.3 mg once.   fluticasone 110 MCG/ACT inhaler Commonly known as: FLOVENT HFA Inhale 2 puffs into the lungs daily.   frovatriptan 2.5 MG tablet Commonly known as: FROVA Take 2.5 mg by mouth as needed.   furosemide 20 MG tablet Commonly known as: LASIX furosemide 20 mg tablet  Take 1 tablet twice a day by oral route as needed.   lansoprazole 30 MG capsule Commonly known as: PREVACID Take 30 mg by mouth daily.   levothyroxine 50 MCG tablet Commonly known as: SYNTHROID   lidocaine 2 % solution Commonly known as: XYLOCAINE Use as directed 5 mLs in the mouth or throat every 4 (four) hours as needed for mouth pain. Gargle and swallow.   losartan 25 MG tablet Commonly known as: COZAAR Take 25 mg by mouth daily.   MAGNESIUM PO Take 2 tablets by mouth daily.   NON FORMULARY Take by mouth 1 day or 1 dose.   Nurtec 75 MG Tbdp Generic drug: Rimegepant Sulfate Take by mouth.   predniSONE 5 MG tablet Commonly known as: DELTASONE prednisone 5 mg tablet   predniSONE 10 MG  (21) Tbpk tablet Commonly known as: STERAPRED UNI-PAK 21 TAB Take following MyChart directions   promethazine 25 MG tablet Commonly known as: PHENERGAN promethazine 25 mg tablet   spironolactone 25 MG tablet Commonly known as: ALDACTONE   THYROID PO Take by mouth 1 day or 1 dose. supplement   TRIAMCINOLONE ACETONIDE (NASAL STEROIDS) Place 2 sprays into the nose at bedtime.   UNABLE TO FIND Take by mouth 1 day or 1 dose. ultraflora balance   Ventolin HFA 108 (90 Base) MCG/ACT inhaler Generic drug: albuterol 1 inhalation as needed   Vitamin D (Ergocalciferol) 1.25 MG (50000 UNIT) Caps capsule Commonly known as: DRISDOL   zafirlukast 10 MG tablet Commonly known as: ACCOLATE Take 10 mg by mouth 2 (two) times daily.       Birth History: non-contributory  Developmental History: non-contributory  Past Surgical History: Past Surgical History:  Procedure Laterality Date  . ARM NEUROPLASTY Right 1994 and 1996   for chronic pain  . CESAREAN SECTION    . OVARIAN CYST REMOVAL  1973  . TONSILLECTOMY AND ADENOIDECTOMY    . TUBAL LIGATION  1988     Family History: Family History  Problem Relation Age of Onset  . Hypertension Mother   . Breast cancer Mother 86  . Thyroid disease Mother   . Stroke Mother   . Dementia Mother   . Cardiomyopathy Father   . Peripheral Artery Disease Father   . Hyperlipidemia Brother   . Hypertension Brother   . Hypertension Brother   . Hyperlipidemia Brother   . Thyroid disease Brother   . Hypertension Brother   . Birth defects Brother   . Congenital heart disease Brother   . Cancer Brother        Kidney cancer  . Colon cancer Maternal Grandmother   . Stroke Paternal Grandfather   . Neuropathy Neg Hx   . Multiple sclerosis Neg Hx   . Migraines Neg Hx      Social History: Linsey lives at home with her family.  She lives in a house that is around 65 years old.  There are hardwoods throughout the home.  She has gas heating and  central cooling.  There is 1 dog in the home.  There are no dust mite covers on the bedding.  There is  no tobacco exposure.  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.  There is no chemical exposure.  There is a HEPA filter in the home.  [place] do [place] interstate or [place] area.   Review of Systems  Constitutional: Negative for chills, fever, malaise/fatigue and weight loss.  HENT: Negative.  Negative for congestion, ear discharge, ear pain and sore throat.   Eyes: Negative for pain, discharge and redness.  Respiratory: Negative for cough, sputum production, shortness of breath and wheezing.   Cardiovascular: Negative.  Negative for chest pain and palpitations.  Gastrointestinal: Negative for abdominal pain, constipation, diarrhea, heartburn, nausea and vomiting.  Skin: Negative.  Negative for itching and rash.  Neurological: Negative for dizziness and headaches.  Endo/Heme/Allergies: Negative for environmental allergies. Does not bruise/bleed easily.       Objective:   Blood pressure (!) 192/120, pulse 92, temperature (!) 96.8 F (36 C), temperature source Temporal, resp. rate 18, height 5' 5.5" (1.664 m), weight 169 lb 9.6 oz (76.9 kg), SpO2 98 %. Body mass index is 27.79 kg/m.   Physical Exam:   Physical Exam  Constitutional: She appears well-developed.  Talkative. Anxious.  HENT:  Head: Normocephalic and atraumatic.  Right Ear: Tympanic membrane, external ear and ear canal normal. No drainage, swelling or tenderness. Tympanic membrane is not injected, not scarred, not erythematous, not retracted and not bulging.  Left Ear: Tympanic membrane, external ear and ear canal normal. No drainage, swelling or tenderness. Tympanic membrane is not injected, not scarred, not erythematous, not retracted and not bulging.  Nose: Mucosal edema and rhinorrhea present. No nasal deformity or septal deviation. No epistaxis. Right sinus exhibits no maxillary sinus tenderness and no frontal  sinus tenderness. Left sinus exhibits no maxillary sinus tenderness and no frontal sinus tenderness.  Mouth/Throat: Uvula is midline and oropharynx is clear and moist. Mucous membranes are not pale and not dry.  Eyes: Pupils are equal, round, and reactive to light. Conjunctivae and EOM are normal. Right eye exhibits no chemosis and no discharge. Left eye exhibits no chemosis and no discharge. Right conjunctiva is not injected. Left conjunctiva is not injected.  Cardiovascular: Normal rate, regular rhythm and normal heart sounds.  Respiratory: Effort normal and breath sounds normal. No accessory muscle usage. No tachypnea. No respiratory distress. She has no wheezes. She has no rhonchi. She has no rales. She exhibits no tenderness.  Moving air well in all lung fields. No increased work of breathing noted.   GI: There is no abdominal tenderness. There is no rebound and no guarding.  Lymphadenopathy:       Head (right side): No submandibular, no tonsillar and no occipital adenopathy present.       Head (left side): No submandibular, no tonsillar and no occipital adenopathy present.    She has no cervical adenopathy.  Neurological: She is alert.  Skin: No abrasion, no petechiae and no rash noted. Rash is not papular, not vesicular and not urticarial. No erythema. No pallor.  Patient scratching throughout the entirety of the visit. She does have some ? livedo reticularis noted on her arms and hands. She has excoriations over much of her body.   Psychiatric: She has a normal mood and affect.     Diagnostic studies:       **However, after patient was discharged, she returned within ten minutes with worsening itching over her entire body. She did have some large urticaria on her abdomen which were knew. Vitals remained normal. We treated with  antihistamines and prednisone with improvement in her symptoms. We did not feel that this was necessarily secondary to the testing, as she was itching even  during the history taking portion of the visit today.**       Salvatore Marvel, MD [facility]

## 2020-01-25 NOTE — Patient Instructions (Addendum)
1. Vaccine reaction - Testing today was negative, including the challenge to the Miralax (PEG). - I think that you will tolerate the COVID vaccine well. - There is currently no definite skin testing available for the Pfizer and Moderna COVID vaccines and data is limited however, there has been a concern regarding sensitivity to PEG in patients who had anaphylactic reactions to the COVID-19 vaccine. - Skin testing was negative to Miralax (source of PEG 3350), methylprednisolone acetate (source of PEG 3350), and triamcinolone acetonide (source of polysorbate-80). - Ideally, after a negative skin testing a test dose of the vaccine is given in the office, but as we do not have access to the vaccine we were unable to do that part. - Call us when you receive the vaccine to give [place] an update.  2. Post COVID syndrome - We are going to refer to the post [place] clinic. - I think this will be very helpful for you.  3. Return in about 3 months (around 04/25/2020). This can be an in-person, a virtual Webex or a telephone follow up visit.   Please inform us of any Emergency Department visits, hospitalizations, or changes in symptoms. Call us before going to the ED for breathing or allergy symptoms since we might be able to fit you in for a sick visit. Feel free to contact us anytime with any questions, problems, or concerns.  It was a pleasure to meet you today!  Websites that have reliable patient information: 1. American Academy of Asthma, Allergy, and Immunology: www.aaaai.org 2. Food Allergy Research and Education (FARE): foodallergy.org 3. Mothers of Asthmatics: http://www.asthmacommunitynetwork.org 4. American College of Allergy, Asthma, and Immunology: www.acaai.org   COVID-19 Vaccine Information can be found at: ShippingScam.co.uk For questions related to vaccine distribution or appointments, please email vaccine@[email or web address] .com or call  [phone].     "Like" [place] on Facebook and Instagram for our latest updates!       HAPPY SPRING!  Make sure you are registered to vote! If you have moved or changed any of your contact information, you will need to get this updated before voting!  In some cases, you MAY be able to register to vote online: [email or web address]

## 2020-01-30 ENCOUNTER — Encounter: Payer: Self-pay | Admitting: Allergy & Immunology

## 2020-01-31 DIAGNOSIS — M542 Cervicalgia: Secondary | ICD-10-CM | POA: Diagnosis not present

## 2020-01-31 DIAGNOSIS — M791 Myalgia, unspecified site: Secondary | ICD-10-CM | POA: Diagnosis not present

## 2020-02-05 MED FILL — AIMOVIG 140 MG/ML SOAJ: 140 | 30 days supply | Qty: 1 | Fill #1

## 2020-02-05 MED FILL — VIT D2 1.25 MG (50,000 UNIT: 1.25 MG | 84 days supply | Qty: 12 | Fill #2

## 2020-02-05 MED FILL — predniSONE 5 MG TABS: 5 | 30 days supply | Qty: 30 | Fill #3

## 2020-02-05 MED FILL — ZAFIRLUKAST 10 MG TAB: 10 | 90 days supply | Qty: 180 | Fill #2

## 2020-02-05 MED FILL — FLOVENT HFA 110 MCG INHALER: 110 | 90 days supply | Qty: 36 | Fill #0

## 2020-02-08 ENCOUNTER — Other Ambulatory Visit: Payer: Self-pay

## 2020-02-08 ENCOUNTER — Ambulatory Visit (INDEPENDENT_AMBULATORY_CARE_PROVIDER_SITE_OTHER): Payer: 59 | Admitting: Nurse Practitioner

## 2020-02-08 VITALS — BP 130/82 | HR 80 | Temp 97.1°F | Ht 65.0 in | Wt 170.2 lb

## 2020-02-08 DIAGNOSIS — R0602 Shortness of breath: Secondary | ICD-10-CM

## 2020-02-08 DIAGNOSIS — M791 Myalgia, unspecified site: Secondary | ICD-10-CM

## 2020-02-08 DIAGNOSIS — M2550 Pain in unspecified joint: Secondary | ICD-10-CM

## 2020-02-08 DIAGNOSIS — R432 Parageusia: Secondary | ICD-10-CM | POA: Diagnosis not present

## 2020-02-08 DIAGNOSIS — R079 Chest pain, unspecified: Secondary | ICD-10-CM

## 2020-02-08 DIAGNOSIS — R21 Rash and other nonspecific skin eruption: Secondary | ICD-10-CM | POA: Diagnosis not present

## 2020-02-08 DIAGNOSIS — Z8616 Personal history of COVID-19: Secondary | ICD-10-CM | POA: Diagnosis not present

## 2020-02-08 DIAGNOSIS — R430 Anosmia: Secondary | ICD-10-CM

## 2020-02-08 NOTE — Patient Instructions (Addendum)
Intermittent chest pain:  Will place referral to cardiology  EKG in office today   Shortness of breath:  Patient walked in office today  - sats remained above 92% for entire walk and heart rate normal  Will check labs  Will check chest x ray  Stay active  Continue current inhalers and antihistamines  Rash/urticaria Muscle and joint pain:  Concerned for possible autoimmune disorder - will check labs  Loss of taste and smell:  Handout given on [facility] otolaryngology department olfactory re-training     Follow up: pending lab and x ray results     Fatigue If you have fatigue, you feel tired all the time and have a lack of energy or a lack of motivation. Fatigue may make it difficult to start or complete tasks because of exhaustion. In general, occasional or mild fatigue is often a normal response to activity or life. However, long-lasting (chronic) or extreme fatigue may be a symptom of a medical condition. Follow these instructions at home: General instructions  Watch your fatigue for any changes.  Go to bed and get up at the same time every day.  Avoid fatigue by pacing yourself during the day and getting enough sleep at night.  Maintain a healthy weight. Medicines  Take over-the-counter and prescription medicines only as told by your health care provider.  Take a multivitamin, if told by your health care provider.  Do not use herbal or dietary supplements unless they are approved by your health care provider. Activity   Exercise regularly, as told by your health care provider.  Use or practice techniques to help you relax, such as yoga, tai chi, meditation, or massage therapy. Eating and drinking   Avoid heavy meals in the evening.  Eat a well-balanced diet, which includes lean proteins, whole grains, plenty of fruits and vegetables, and low-fat dairy products.  Avoid consuming too much caffeine.  Avoid the use of  alcohol.  Drink enough fluid to keep your urine pale yellow. Lifestyle  Change situations that cause you stress. Try to keep your work and personal schedule in balance.  Do not use any products that contain nicotine or tobacco, such as cigarettes and e-cigarettes. If you need help quitting, ask your health care provider.  Do not use drugs. Contact a health care provider if:  Your fatigue does not get better.  You have a fever.  You suddenly lose or gain weight.  You have headaches.  You have trouble falling asleep or sleeping through the night.  You feel angry, guilty, anxious, or sad.  You are unable to have a bowel movement (constipation).  Your skin is dry.  You have swelling in your legs or another part of your body. Get help right away if:  You feel confused.  Your vision is blurry.  You feel faint or you pass out.  You have a severe headache.  You have severe pain in your abdomen, your back, or the area between your waist and hips (pelvis).  You have chest pain, shortness of breath, or an irregular or fast heartbeat.  You are unable to urinate, or you urinate less than normal.  You have abnormal bleeding, such as bleeding from the rectum, vagina, nose, lungs, or nipples.  You vomit blood.  You have thoughts about hurting yourself or others. If you ever feel like you may hurt yourself or others, or have thoughts about taking your own life, get help right away. You can go to your nearest  emergency department or call:  Your local emergency services (911 in the [place]  A suicide crisis helpline, such as the [facility] at 223-450-3949. This is open 24 hours a day. Summary  If you have fatigue, you feel tired all the time and have a lack of energy or a lack of motivation.  Fatigue may make it difficult to start or complete tasks because of exhaustion.  Long-lasting (chronic) or extreme fatigue may be a symptom of a medical  condition.  Exercise regularly, as told by your health care provider.  Change situations that cause you stress. Try to keep your work and personal schedule in balance. This information is not intended to replace advice given to you by your health care provider. Make sure you discuss any questions you have with your health care provider. Document Revised: 05/03/2019 Document Reviewed: 07/07/2017 Elsevier Patient Education  2020 [employer].

## 2020-02-08 NOTE — Progress Notes (Signed)
@Patient  ID: Rachel Fry, female    DOB: Sep 30, 1956, 64 y.o.   MRN: [id number]  Chief Complaint  Patient presents with  . Post COVID    Still feeling faitgue. Patient stated her taste and smell come and go. Denied cough and fever.     Referring provider: [name], MD  64 year old female with history of asthma, migraines, GERD, anxiety. Diagnosed with covid in September 2020 through health at work.   HPI Patient presents today for post Covid care clinic visit.  Patient states that she tested positive in September with her health at work.  She works at [employer].  She states that she is still experiencing ongoing shortness of breath with exertion, muscle and joint pain, headaches, and rash.  She also is still experiencing loss of taste and smell.  She reports that at times she does have intermittent chest pain as well.  Patient does see neurology for chronic migraines.  Patient has been to dermatology for rash.  She states that her dermatologist as well to do a biopsy for further evaluation and treatment of rash.  Patient states that she also experiences chronic fatigue.  Patient does have a history of asthma and allergies and is followed by asthma and allergy specialist.  Patient is currently on albuterol as needed, Flovent, prednisone 5 mg daily. Denies f/c/s, n/v/d, hemoptysis, PND, leg swelling, chest pain or edema.    EKG: NSR, possible left atrial enlargement, septal infarct - age undetermined   Allergies  Allergen Reactions  . Flonase  [Fluticasone Propionate] Other (See Comments)  . Latex Itching  . Codeine     Rash/vomiting  . Fish Oil Itching  . Sulfa Antibiotics     abdominal pain    Immunization History  Administered Date(s) Administered  . Influenza,inj,Quad PF,6+ Mos 08/27/2015  . Tdap 10/11/2009    Past Medical History:  Diagnosis Date  . Allergy   . Asthma   . Migraines   . Numbness and tingling    left side of body,  occasionally right side   . S/P Botox injection     Tobacco History: Social History   Tobacco Use  Smoking Status Never Smoker  Smokeless Tobacco Never Used   Counseling given: Not Answered   Outpatient Encounter Medications as of 02/08/2020  Medication Sig  . albuterol (VENTOLIN HFA) 108 (90 Base) MCG/ACT inhaler 1 inhalation as needed  . atorvastatin (LIPITOR) 20 MG tablet TAKE 1 TABLET (20 MG TOTAL) BY MOUTH DAILY.  . botulinum toxin Type A (BOTOX) 100 UNITS SOLR injection Botox - Historical Medication  once every 3 months for migraines Active  . cetirizine (ZYRTEC) 10 MG tablet Take by mouth.  . cloNIDine (CATAPRES) 0.1 MG tablet Take 0.1 mg by mouth daily.   Marland Kitchen EPINEPHrine 0.3 mg/0.3 mL IJ SOAJ injection 0.3 mg once.   Eduard Roux (AIMOVIG) 70 MG/ML SOAJ Inject into the skin every 30 (thirty) days.  . fluticasone (FLOVENT HFA) 110 MCG/ACT inhaler Inhale 2 puffs into the lungs daily.   . frovatriptan (FROVA) 2.5 MG tablet Take 2.5 mg by mouth as needed.   . furosemide (LASIX) 20 MG tablet furosemide 20 mg tablet  Take 1 tablet twice a day by oral route as needed.  . lansoprazole (PREVACID) 30 MG capsule Take 30 mg by mouth daily.  Marland Kitchen levothyroxine (SYNTHROID, LEVOTHROID) 50 MCG tablet   . Rimegepant Sulfate (NURTEC) 75 MG TBDP Take by mouth.  . rizatriptan (MAXALT) 10  MG tablet Take by mouth.  . spironolactone (ALDACTONE) 25 MG tablet   . TRIAMCINOLONE ACETONIDE, NASAL STEROIDS, Place 2 sprays into the nose at bedtime.   . Vitamin D, Ergocalciferol, (DRISDOL) 1.25 MG (50000 UT) CAPS capsule   . zafirlukast (ACCOLATE) 10 MG tablet Take 10 mg by mouth 2 (two) times daily.   . butalbital-aspirin-caffeine (FIORINAL) 50-325-40 MG capsule   . diphenhydrAMINE (BENADRYL) 50 MG tablet Take 1 tablet (50 mg total) by mouth at bedtime as needed for up to 7 days for allergies.  Marland Kitchen ezetimibe (ZETIA) 10 MG tablet Take 10 mg by mouth daily.  Marland Kitchen Galcanezumab-gnlm (EMGALITY) 120 MG/ML SOAJ  Inject into the skin.  . hydrOXYzine (ATARAX/VISTARIL) 10 MG tablet hydroxyzine HCl 10 mg tablet  . ivermectin (STROMECTOL) 3 MG TABS tablet ivermectin 3 mg tablet  . lidocaine (XYLOCAINE) 2 % solution Use as directed 5 mLs in the mouth or throat every 4 (four) hours as needed for mouth pain. Gargle and swallow. (Patient not taking: Reported on 12/30/2018)  . losartan (COZAAR) 25 MG tablet Take 25 mg by mouth daily.  Marland Kitchen MAGNESIUM PO Take 2 tablets by mouth daily.  . Multiple Vitamins-Minerals (ANTIOXIDANT PO) Take 1 tablet by mouth daily. Flame Rx  . NON FORMULARY Take by mouth 1 day or 1 dose.  . predniSONE (DELTASONE) 5 MG tablet prednisone 5 mg tablet  . predniSONE (STERAPRED UNI-PAK 21 TAB) 10 MG (21) TBPK tablet Take following MyChart directions  . promethazine (PHENERGAN) 25 MG tablet promethazine 25 mg tablet  . THYROID PO Take by mouth 1 day or 1 dose. supplement  . UNABLE TO FIND Take by mouth 1 day or 1 dose. ultraflora balance  . [DISCONTINUED] cloNIDine (CATAPRES) 0.1 MG tablet    No facility-administered encounter medications on file as of 02/08/2020.     Review of Systems  Review of Systems  Constitutional: Positive for fatigue. Negative for chills and fever.  HENT: Negative.   Respiratory: Positive for shortness of breath. Negative for cough and wheezing.   Cardiovascular: Negative.  Negative for chest pain, palpitations and leg swelling.  Gastrointestinal: Negative.   Allergic/Immunologic: Negative.   Neurological: Positive for headaches.  Psychiatric/Behavioral: Negative.  Negative for confusion.       Physical Exam  BP 130/82 (BP Location: Left Arm, Patient Position: Sitting, Cuff Size: Small)   Pulse 80   Temp (!) 97.1 F (36.2 C)   Ht 5\' 5"  (1.651 m)   Wt 170 lb 4 oz (77.2 kg)   SpO2 98%   BMI 28.33 kg/m   Wt Readings from Last 5 Encounters:  02/08/20 170 lb 4 oz (77.2 kg)  01/25/20 169 lb 9.6 oz (76.9 kg)  12/30/18 183 lb (83 kg)  09/20/18 188 lb  (85.3 kg)  08/26/18 184 lb (83.5 kg)     Physical Exam Vitals and nursing note reviewed.  Constitutional:      General: She is not in acute distress.    Appearance: She is well-developed.  Cardiovascular:     Rate and Rhythm: Normal rate and regular rhythm.  Pulmonary:     Effort: Pulmonary effort is normal.     Breath sounds: Normal breath sounds.  Skin:    Comments: No rash noted today during exam.   Neurological:     Mental Status: She is alert and oriented to person, place, and time.       Assessment & Plan:   History of COVID-19 Intermittent chest pain:  Will place referral  to cardiology  EKG in office today   Shortness of breath:  Patient walked in office today  - sats remained above 92% for entire walk and heart rate normal  Will check labs  Will check chest x ray  Stay active  Continue current inhalers and antihistamines  Rash/urticaria Muscle and joint pain:  Concerned for possible autoimmune disorder - will check labs  Loss of taste and smell:  Handout given on [facility] otolaryngology department olfactory re-training     Follow up: pending lab and x ray results      Fenton Foy, NP 02/09/2020

## 2020-02-09 DIAGNOSIS — R0602 Shortness of breath: Secondary | ICD-10-CM | POA: Insufficient documentation

## 2020-02-09 DIAGNOSIS — R430 Anosmia: Secondary | ICD-10-CM | POA: Insufficient documentation

## 2020-02-09 DIAGNOSIS — M2550 Pain in unspecified joint: Secondary | ICD-10-CM | POA: Insufficient documentation

## 2020-02-09 DIAGNOSIS — M791 Myalgia, unspecified site: Secondary | ICD-10-CM | POA: Diagnosis not present

## 2020-02-09 DIAGNOSIS — J305 Allergic rhinitis due to food: Secondary | ICD-10-CM | POA: Diagnosis not present

## 2020-02-09 DIAGNOSIS — R21 Rash and other nonspecific skin eruption: Secondary | ICD-10-CM | POA: Insufficient documentation

## 2020-02-09 DIAGNOSIS — R432 Parageusia: Secondary | ICD-10-CM | POA: Insufficient documentation

## 2020-02-09 DIAGNOSIS — R079 Chest pain, unspecified: Secondary | ICD-10-CM | POA: Insufficient documentation

## 2020-02-09 DIAGNOSIS — Z8616 Personal history of COVID-19: Secondary | ICD-10-CM | POA: Insufficient documentation

## 2020-02-09 DIAGNOSIS — T7809XA Anaphylactic reaction due to other food products, initial encounter: Secondary | ICD-10-CM | POA: Diagnosis not present

## 2020-02-09 NOTE — Assessment & Plan Note (Signed)
Intermittent chest pain:  Will place referral to cardiology  EKG in office today   Shortness of breath:  Patient walked in office today  - sats remained above 92% for entire walk and heart rate normal  Will check labs  Will check chest x ray  Stay active  Continue current inhalers and antihistamines  Rash/urticaria Muscle and joint pain:  Concerned for possible autoimmune disorder - will check labs  Loss of taste and smell:  Handout given on [facility] otolaryngology department olfactory re-training     Follow up: pending lab and x ray results

## 2020-02-10 NOTE — Progress Notes (Signed)
Cardiology Office Note  Date:  02/12/2020   ID:  Darrion, [place] [birth year], MRN [id number]  PCP:  [name], MD   Chief Complaint  Patient presents with  . office visit    Pt having SOB and chest pain after having COVID in September. EKG read abnormal. Rash on arms. Meds verbally reviewed w/ pt.    HPI:  Rachel Fry is a 64 year old woman with past medical history of Hyperlipidemia Minimal aortic atherosclerosis on CT scan 2015 Hx of asthma  tested positive for Covid  in September with her health at work. Who presents for new patient evaluation of her shortness of breath, chest tightness  Since she had Covid end of last year she has been experiencing ongoing shortness of breath with exertion,  muscle and joint pain, headaches, and rash.   loss of taste and smell.    intermittent chest pain  Patient does see neurology for chronic migraines.    Patient has been to dermatology for rash.   Has also noticed symptoms of chronic fatigue.    asthma and allergies  For treatment of her rash she has tried Papcid/atarax, zyrtec Prednisone taper, x 2  She has had the Covid vaccine x 1 Sx worse since then   using inhaler more  EKG personally reviewed by myself on todays visit Shows normal sinus rhythm with no significant ST-T wave changes  PMH:   has a past medical history of Allergy, Asthma, Migraines, Numbness and tingling, and S/P Botox injection.  PSH:    Past Surgical History:  Procedure Laterality Date  . ARM NEUROPLASTY Right 1994 and 1996   for chronic pain  . CESAREAN SECTION    . OVARIAN CYST REMOVAL  1973  . TONSILLECTOMY AND ADENOIDECTOMY    . TUBAL LIGATION  1988    Current Outpatient Medications  Medication Sig Dispense Refill  . albuterol (VENTOLIN HFA) 108 (90 Base) MCG/ACT inhaler 1 inhalation as needed    . atorvastatin (LIPITOR) 20 MG tablet TAKE 1 TABLET (20 MG TOTAL) BY MOUTH DAILY. 90 tablet 3  . botulinum toxin Type A (BOTOX) 100 UNITS  SOLR injection Botox - Historical Medication  once every 3 months for migraines Active    . butalbital-aspirin-caffeine (FIORINAL) 50-325-40 MG capsule     . cetirizine (ZYRTEC) 10 MG tablet Take by mouth.    . cloNIDine (CATAPRES) 0.1 MG tablet Take 0.1 mg by mouth daily.     Marland Kitchen EPINEPHrine 0.3 mg/0.3 mL IJ SOAJ injection 0.3 mg once.   1  . Erenumab-aooe (AIMOVIG) 70 MG/ML SOAJ Inject into the skin every 30 (thirty) days.    Marland Kitchen ezetimibe (ZETIA) 10 MG tablet Take 10 mg by mouth daily.    . fluticasone (FLOVENT HFA) 110 MCG/ACT inhaler Inhale 2 puffs into the lungs daily.     . frovatriptan (FROVA) 2.5 MG tablet Take 2.5 mg by mouth as needed.     . furosemide (LASIX) 20 MG tablet furosemide 20 mg tablet  Take 1 tablet twice a day by oral route as needed.    . lansoprazole (PREVACID) 30 MG capsule Take 30 mg by mouth daily.  12  . levothyroxine (SYNTHROID, LEVOTHROID) 50 MCG tablet   5  . NON FORMULARY Take by mouth 1 day or 1 dose.    . promethazine (PHENERGAN) 25 MG tablet promethazine 25 mg tablet    . Rimegepant Sulfate (NURTEC) 75 MG TBDP Take by mouth.    . rizatriptan (MAXALT) 10 MG tablet  Take by mouth.    . spironolactone (ALDACTONE) 25 MG tablet     . TRIAMCINOLONE ACETONIDE, NASAL STEROIDS, Place 2 sprays into the nose at bedtime.     . Vitamin D, Ergocalciferol, (DRISDOL) 1.25 MG (50000 UT) CAPS capsule   3  . zafirlukast (ACCOLATE) 10 MG tablet Take 10 mg by mouth 2 (two) times daily.      No current facility-administered medications for this visit.     Allergies:   Flonase  [fluticasone propionate], Latex, Codeine, Fish oil, and Sulfa antibiotics   Social History:  The patient  reports that she has never smoked. She has never used smokeless tobacco. She reports that she does not drink alcohol or use drugs.   Family History:   family history includes Birth defects in her brother; Breast cancer (age of onset: 69) in her mother; Cancer in her brother; Cardiomyopathy in her  father; Colon cancer in her maternal grandmother; Congenital heart disease in her brother; Dementia in her mother; Hyperlipidemia in her brother and brother; Hypertension in her brother, brother, brother, and mother; Peripheral Artery Disease in her father; Stroke in her mother and paternal grandfather; Thyroid disease in her brother and mother.    Review of Systems: Review of Systems  Constitutional: Negative.   HENT: Negative.   Respiratory: Negative.   Cardiovascular: Negative.   Gastrointestinal: Negative.   Musculoskeletal: Negative.   Neurological: Negative.   Psychiatric/Behavioral: Negative.   All other systems reviewed and are negative.   PHYSICAL EXAM: VS:  BP (!) 159/100 (BP Location: Left Arm, Patient Position: Sitting, Cuff Size: Normal)   Pulse 71   Ht 5\' 5"  (1.651 m)   Wt 172 lb (78 kg)   SpO2 98%   BMI 28.62 kg/m  , BMI Body mass index is 28.62 kg/m. GEN: Well nourished, well developed, in no acute distress HEENT: normal Neck: no JVD, carotid bruits, or masses Cardiac: RRR; no murmurs, rubs, or gallops,no edema  Respiratory:  clear to auscultation bilaterally, normal work of breathing GI: soft, nontender, nondistended, + BS MS: no deformity or atrophy Skin: warm and dry, no rash Neuro:  Strength and sensation are intact Psych: euthymic mood, full affect    Recent Labs: 09/26/2019: ALT 21; BUN 14; Creatinine, Ser 0.78; Potassium 3.8; Sodium 141; TSH 1.359    Lipid Panel Lab Results  Component Value Date   CHOL 201 (H) 12/10/2016   HDL 59 09/26/2019   LDLCALC 123 (H) 12/10/2016   TRIG 116 09/26/2019      Wt Readings from Last 3 Encounters:  02/12/20 172 lb (78 kg)  02/08/20 170 lb 4 oz (77.2 kg)  01/25/20 169 lb 9.6 oz (76.9 kg)      ASSESSMENT AND PLAN:  Problem List Items Addressed This Visit      Cardiology Problems   Hypercholesteremia     Other   Intermittent chest pain   History of COVID-19   Shortness of breath - Primary    Relevant Orders   EKG 12-Lead   Bradycardia     Shortness of breath Etiology unclear, recent Covid infection in September Could still be recovering EKG normal which is reassuring Recommended echocardiogram to rule out structural heart disease Less likely ischemia  Aortic atherosclerosis Minimal plaque in 2015 noted on CT She is treating cholesterol  COVID-19 Not treated with remdesivir Residual symptoms Though she may have got the infection from her husband who was sick first with cough  Disposition:   F/U as needed We  will call with the results of the echo   Total encounter time more than 25 minutes  Greater than 50% was spent in counseling and coordination of care with the patient    Signed, Esmond Plants, M.D., Ph.D. [place], [place]

## 2020-02-12 ENCOUNTER — Other Ambulatory Visit: Payer: Self-pay

## 2020-02-12 ENCOUNTER — Encounter: Payer: Self-pay | Admitting: Cardiovascular Disease

## 2020-02-12 ENCOUNTER — Ambulatory Visit (INDEPENDENT_AMBULATORY_CARE_PROVIDER_SITE_OTHER): Payer: 59 | Admitting: Cardiovascular Disease

## 2020-02-12 VITALS — BP 159/100 | HR 71 | Ht 65.0 in | Wt 172.0 lb

## 2020-02-12 DIAGNOSIS — R0602 Shortness of breath: Secondary | ICD-10-CM | POA: Diagnosis not present

## 2020-02-12 DIAGNOSIS — R079 Chest pain, unspecified: Secondary | ICD-10-CM | POA: Diagnosis not present

## 2020-02-12 DIAGNOSIS — R001 Bradycardia, unspecified: Secondary | ICD-10-CM

## 2020-02-12 DIAGNOSIS — E7800 Pure hypercholesterolemia, unspecified: Secondary | ICD-10-CM | POA: Diagnosis not present

## 2020-02-12 DIAGNOSIS — Z8616 Personal history of COVID-19: Secondary | ICD-10-CM | POA: Diagnosis not present

## 2020-02-12 MED FILL — EZETIMIBE 10 MG TABS: 10 | 90 days supply | Qty: 90 | Fill #3

## 2020-02-12 MED FILL — ATORVASTATIN 20 MG TABLET: 20 | 90 days supply | Qty: 90 | Fill #3

## 2020-02-12 NOTE — Patient Instructions (Addendum)
Medication Instructions:  No changes  If you need a refill on your cardiac medications before your next appointment, please call your pharmacy.    Lab work: No new labs needed   If you have labs (blood work) drawn today and your tests are completely normal, you will receive your results only by: Marland Kitchen MyChart Message (if you have MyChart) OR . A paper copy in the mail If you have any lab test that is abnormal or we need to change your treatment, we will call you to review the results.   Testing/Procedures: Echo shortness of breath , post covid Your physician has requested that you have an echocardiogram. Echocardiography is a painless test that uses sound waves to create images of your heart. It provides your doctor with information about the size and shape of your heart and how well your heart's chambers and valves are working. This procedure takes approximately one hour. There are no restrictions for this procedure.     Follow-Up: At [facility], you and your health needs are our priority.  As part of our continuing mission to provide you with exceptional heart care, we have created designated Provider Care Teams.  These Care Teams include your primary Cardiologist (physician) and Advanced Practice Providers (APPs -  Physician Assistants and Nurse Practitioners) who all work together to provide you with the care you need, when you need it.  . You will need a follow up appointment as needed .  Marland Kitchen Providers on your designated Care Team:   . Murray Hodgkins, NP . Christell Faith, PA-C . Marrianne Mood, PA-C  Any Other Special Instructions Will Be Listed Below (If Applicable).  For educational health videos Log in to : [email or web address] Or : [email or web address], password : [email or web address]

## 2020-02-13 LAB — ANA,IFA RA DIAG PNL W/RFLX TIT/PATN
ANA Titer 1: NEGATIVE
Cyclic Citrullin Peptide Ab: 4 units (ref 0–19)
Rheumatoid fact SerPl-aCnc: <10 IU/mL (ref 0.0–13.9)

## 2020-02-13 LAB — COMPREHENSIVE METABOLIC PANEL
ALT: 14 IU/L (ref 0–32)
AST: 12 IU/L (ref 0–40)
Albumin/Globulin Ratio: 2.6 — ABNORMAL HIGH (ref 1.2–2.2)
Albumin: 4.5 g/dL (ref 3.8–4.8)
Alkaline Phosphatase: 80 IU/L (ref 39–117)
BUN/Creatinine Ratio: 12 (ref 12–28)
BUN: 11 mg/dL (ref 8–27)
Bilirubin Total: 0.4 mg/dL (ref 0.0–1.2)
CO2: 24 mmol/L (ref 20–29)
Calcium: 10.8 mg/dL — ABNORMAL HIGH (ref 8.7–10.3)
Chloride: 105 mmol/L (ref 96–106)
Creatinine, Ser: 0.89 mg/dL (ref 0.57–1.00)
GFR calc Af Amer: 79 mL/min/{1.73_m2} (ref >59)
GFR calc non Af Amer: 69 mL/min/{1.73_m2} (ref >59)
Globulin, Total: 1.7 g/dL (ref 1.5–4.5)
Glucose: 94 mg/dL (ref 65–99)
Potassium: 3.8 mmol/L (ref 3.5–5.2)
Sodium: 146 mmol/L — ABNORMAL HIGH (ref 134–144)
Total Protein: 6.2 g/dL (ref 6.0–8.5)

## 2020-02-13 LAB — CBC WITH DIFFERENTIAL/PLATELET
Basophils Absolute: 0.1 10*3/uL (ref 0.0–0.2)
Basos: 1 %
EOS (ABSOLUTE): 0 10*3/uL (ref 0.0–0.4)
Eos: 0 %
Hematocrit: 44.3 % (ref 34.0–46.6)
Hemoglobin: 14.8 g/dL (ref 11.1–15.9)
Immature Grans (Abs): 0.1 10*3/uL (ref 0.0–0.1)
Immature Granulocytes: 1 %
Lymphocytes Absolute: 2.6 10*3/uL (ref 0.7–3.1)
Lymphs: 26 %
MCH: 30.6 pg (ref 26.6–33.0)
MCHC: 33.4 g/dL (ref 31.5–35.7)
MCV: 92 fL (ref 79–97)
Monocytes Absolute: 0.6 10*3/uL (ref 0.1–0.9)
Monocytes: 6 %
Neutrophils Absolute: 6.7 10*3/uL (ref 1.4–7.0)
Neutrophils: 66 %
Platelets: 222 10*3/uL (ref 150–450)
RBC: 4.83 x10E6/uL (ref 3.77–5.28)
RDW: 12.4 % (ref 11.7–15.4)
WBC: 10 10*3/uL (ref 3.4–10.8)

## 2020-02-13 LAB — SEDIMENTATION RATE: Sed Rate: 4 mm/hr (ref 0–40)

## 2020-02-13 NOTE — Progress Notes (Signed)
Patient notified of results, verbally understood.

## 2020-02-14 ENCOUNTER — Ambulatory Visit
Admission: RE | Admit: 2020-02-14 | Discharge: 2020-02-14 | Disposition: A | Discharge status: Home / Self Care | Payer: 59 | Source: Ambulatory Visit | Attending: Nurse Practitioner | Admitting: Nurse Practitioner

## 2020-02-14 ENCOUNTER — Ambulatory Visit (HOSPITAL_COMMUNITY): Payer: 59 | Attending: Cardiology

## 2020-02-14 ENCOUNTER — Other Ambulatory Visit: Payer: Self-pay

## 2020-02-14 DIAGNOSIS — Z8616 Personal history of COVID-19: Secondary | ICD-10-CM | POA: Diagnosis not present

## 2020-02-14 DIAGNOSIS — R0602 Shortness of breath: Secondary | ICD-10-CM | POA: Diagnosis not present

## 2020-02-14 DIAGNOSIS — R0600 Dyspnea, unspecified: Secondary | ICD-10-CM | POA: Diagnosis not present

## 2020-02-15 ENCOUNTER — Telehealth: Payer: Self-pay | Admitting: Cardiovascular Disease

## 2020-02-15 NOTE — Telephone Encounter (Signed)
Spoke with patient and her echo results released to My Chart. Reviewed it is still pending provider but we did discuss her preliminary results . Advised that we would call her back once he sends me his interpretation. She verbalized understanding with no further questions at this time.

## 2020-02-15 NOTE — Telephone Encounter (Signed)
Please call with echo results 

## 2020-02-19 DIAGNOSIS — L500 Allergic urticaria: Secondary | ICD-10-CM | POA: Diagnosis not present

## 2020-02-19 DIAGNOSIS — L501 Idiopathic urticaria: Secondary | ICD-10-CM | POA: Diagnosis not present

## 2020-02-28 ENCOUNTER — Other Ambulatory Visit (HOSPITAL_COMMUNITY): Payer: Self-pay | Admitting: Psychiatry

## 2020-02-28 DIAGNOSIS — E559 Vitamin D deficiency, unspecified: Secondary | ICD-10-CM | POA: Diagnosis not present

## 2020-02-28 DIAGNOSIS — E785 Hyperlipidemia, unspecified: Secondary | ICD-10-CM | POA: Diagnosis not present

## 2020-02-28 DIAGNOSIS — E039 Hypothyroidism, unspecified: Secondary | ICD-10-CM | POA: Diagnosis not present

## 2020-02-28 DIAGNOSIS — M542 Cervicalgia: Secondary | ICD-10-CM | POA: Diagnosis not present

## 2020-02-28 MED FILL — FUROSEMIDE 20 MG TABS: 20 | 90 days supply | Qty: 180 | Fill #0

## 2020-03-11 ENCOUNTER — Ambulatory Visit: Payer: 59 | Admitting: Cardiovascular Disease

## 2020-03-11 MED FILL — CloNIDine HCL 0.1 MG TAB: 0.1 | 90 days supply | Qty: 180 | Fill #1

## 2020-03-11 MED FILL — predniSONE 5 MG TABS: 5 | 30 days supply | Qty: 30 | Fill #0

## 2020-03-14 ENCOUNTER — Other Ambulatory Visit: Payer: Self-pay | Admitting: Internal Medicine

## 2020-03-14 DIAGNOSIS — E2839 Other primary ovarian failure: Secondary | ICD-10-CM

## 2020-03-21 MED FILL — FROVATRIPTAN SUCCINATE 2.5: 2.5 | 30 days supply | Qty: 18 | Fill #1

## 2020-03-21 MED FILL — NURTEC 75 MG TBDP: 75 | 60 days supply | Qty: 16 | Fill #1

## 2020-04-01 ENCOUNTER — Ambulatory Visit
Admission: RE | Admit: 2020-04-01 | Discharge: 2020-04-01 | Disposition: A | Discharge status: Home / Self Care | Payer: 59 | Source: Ambulatory Visit | Attending: Internal Medicine | Admitting: Internal Medicine

## 2020-04-01 DIAGNOSIS — Z780 Asymptomatic menopausal state: Secondary | ICD-10-CM | POA: Diagnosis not present

## 2020-04-01 DIAGNOSIS — E2839 Other primary ovarian failure: Secondary | ICD-10-CM | POA: Insufficient documentation

## 2020-04-01 DIAGNOSIS — M85852 Other specified disorders of bone density and structure, left thigh: Secondary | ICD-10-CM | POA: Diagnosis not present

## 2020-04-01 DIAGNOSIS — M810 Age-related osteoporosis without current pathological fracture: Secondary | ICD-10-CM | POA: Diagnosis not present

## 2020-04-02 DIAGNOSIS — G43709 Chronic migraine without aura, not intractable, without status migrainosus: Secondary | ICD-10-CM | POA: Diagnosis not present

## 2020-04-08 MED FILL — predniSONE 5 MG TABS: 5 | 30 days supply | Qty: 30 | Fill #1

## 2020-04-08 MED FILL — LEVOTHYROXINE 50 MCG TABLET: 50 | 90 days supply | Qty: 90 | Fill #1

## 2020-04-12 ENCOUNTER — Other Ambulatory Visit: Payer: Self-pay | Admitting: Internal Medicine

## 2020-04-12 DIAGNOSIS — R310 Gross hematuria: Secondary | ICD-10-CM

## 2020-04-15 ENCOUNTER — Ambulatory Visit
Admission: RE | Admit: 2020-04-15 | Discharge: 2020-04-15 | Disposition: A | Discharge status: Home / Self Care | Payer: 59 | Attending: Internal Medicine | Admitting: Internal Medicine

## 2020-04-15 ENCOUNTER — Ambulatory Visit
Admission: RE | Admit: 2020-04-15 | Discharge: 2020-04-15 | Disposition: A | Discharge status: Home / Self Care | Payer: 59 | Source: Ambulatory Visit | Attending: Internal Medicine | Admitting: Internal Medicine

## 2020-04-15 DIAGNOSIS — R310 Gross hematuria: Secondary | ICD-10-CM | POA: Diagnosis not present

## 2020-04-19 ENCOUNTER — Other Ambulatory Visit: Payer: Self-pay | Admitting: *Deleted

## 2020-04-19 ENCOUNTER — Ambulatory Visit (INDEPENDENT_AMBULATORY_CARE_PROVIDER_SITE_OTHER): Payer: 59 | Admitting: Urology

## 2020-04-19 ENCOUNTER — Ambulatory Visit: Payer: 59

## 2020-04-19 ENCOUNTER — Other Ambulatory Visit
Admission: RE | Admit: 2020-04-19 | Discharge: 2020-04-19 | Disposition: A | Discharge status: Home / Self Care | Payer: 59 | Source: Ambulatory Visit | Attending: Urology | Admitting: Urology

## 2020-04-19 ENCOUNTER — Encounter: Payer: Self-pay | Admitting: Urology

## 2020-04-19 ENCOUNTER — Ambulatory Visit
Admission: RE | Admit: 2020-04-19 | Discharge: 2020-04-19 | Disposition: A | Discharge status: Home / Self Care | Payer: 59 | Source: Ambulatory Visit | Attending: Urology | Admitting: Urology

## 2020-04-19 ENCOUNTER — Other Ambulatory Visit: Payer: Self-pay

## 2020-04-19 VITALS — BP 178/106 | HR 89 | Ht 65.0 in | Wt 177.0 lb

## 2020-04-19 DIAGNOSIS — R109 Unspecified abdominal pain: Secondary | ICD-10-CM | POA: Insufficient documentation

## 2020-04-19 DIAGNOSIS — R310 Gross hematuria: Secondary | ICD-10-CM

## 2020-04-19 DIAGNOSIS — R1084 Generalized abdominal pain: Secondary | ICD-10-CM | POA: Insufficient documentation

## 2020-04-19 DIAGNOSIS — D259 Leiomyoma of uterus, unspecified: Secondary | ICD-10-CM | POA: Diagnosis not present

## 2020-04-19 DIAGNOSIS — N202 Calculus of kidney with calculus of ureter: Secondary | ICD-10-CM | POA: Diagnosis not present

## 2020-04-19 LAB — URINALYSIS, COMPLETE (UACMP) WITH MICROSCOPIC
Bacteria, UA: NONE SEEN
Glucose, UA: NEGATIVE mg/dL
Leukocytes,Ua: NEGATIVE
Nitrite: NEGATIVE
Protein, ur: 30 mg/dL — AB
RBC / HPF: >50 RBC/hpf (ref 0–5)
Specific Gravity, Urine: >1.03 — ABNORMAL HIGH (ref 1.005–1.030)
Squamous Epithelial / HPF: NONE SEEN (ref 0–5)
pH: 5 (ref 5.0–8.0)

## 2020-04-19 LAB — CBC
HCT: 45.5 % (ref 36.0–46.0)
Hemoglobin: 14.4 g/dL (ref 12.0–15.0)
MCH: 29.8 pg (ref 26.0–34.0)
MCHC: 31.6 g/dL (ref 30.0–36.0)
MCV: 94.2 fL (ref 80.0–100.0)
Platelets: 260 10*3/uL (ref 150–400)
RBC: 4.83 MIL/uL (ref 3.87–5.11)
RDW: 13.2 % (ref 11.5–15.5)
WBC: 11.2 10*3/uL — ABNORMAL HIGH (ref 4.0–10.5)
nRBC: 0 % (ref 0.0–0.2)

## 2020-04-19 MED ORDER — OXYCODONE-ACETAMINOPHEN 5-325 MG PO TABS: 1.0000 | ORAL_TABLET | ORAL | 0 refills | Status: DC | PRN

## 2020-04-19 MED ORDER — KETOROLAC TROMETHAMINE 60 MG/2ML IM SOLN
30.0000 mg | Freq: Once | INTRAMUSCULAR | Status: AC
  Administered 2020-04-19: 30 mg via INTRAMUSCULAR

## 2020-04-19 MED ORDER — TAMSULOSIN HCL 0.4 MG PO CAPS: 0.4000 mg | ORAL_CAPSULE | Freq: Every day | ORAL | 0 refills | Status: DC

## 2020-04-19 NOTE — H&P (View-Only) (Signed)
04/19/20 11:19 AM   Rachel Fry September 18, 1956 [id number]  Referring provider: [name], MD [address] [place],  [place] [address] Chief Complaint  Patient presents with  . New Patient (Initial Visit)    Hematuria    HPI: Rachel Fry is a 64 y.o. F w/ hx of kidney stones presents today for the evaluation and management of gross hematuria/ left flank pain.  Visited PCP on 04/12/20 diagnosed w/ gross hematuria. F/u KUB revealed large amount of stool on the right side of the abdomen and pelvis. Nonobstructive bowel gas pattern. Limited evaluation for renal calculi due to bowel gas and stool. Stable pelvic calcifications are suggestive for phleboliths.  She reports of gross hematuria onset 3 weeks ago w/ associated symptoms of severe flank pain concentrated on left side. She denies bothersome bladder symptoms.  In the office today, she is in mild distress, severely nauseous and in pain.  She does have a personal history of stones and underwent ureteroscopy in the past.  PMH: Past Medical History:  Diagnosis Date  . Allergy   . Asthma   . Migraines   . Numbness and tingling    left side of body, occasionally right side   . S/P Botox injection     Surgical History: Past Surgical History:  Procedure Laterality Date  . ARM NEUROPLASTY Right 1994 and 1996   for chronic pain  . CESAREAN SECTION    . OVARIAN CYST REMOVAL  1973  . TONSILLECTOMY AND ADENOIDECTOMY    . TUBAL LIGATION  1988    Home Medications:  Allergies as of 04/19/2020      Reactions   Flonase  [fluticasone Propionate] Other (See Comments)   Latex Itching   Codeine    Rash/vomiting   Fish Oil Itching   Sulfa Antibiotics    abdominal pain      Medication List       Accurate as of April 19, 2020 11:59 PM. If you have any questions, ask your nurse or doctor.        STOP taking these medications   butalbital-aspirin-caffeine 50-325-40 MG capsule Commonly known as:  FIORINAL Stopped by: Hollice Espy, MD     TAKE these medications   Aimovig 70 MG/ML Soaj Generic drug: Erenumab-aooe Inject into the skin every 30 (thirty) days.   atorvastatin 20 MG tablet Commonly known as: LIPITOR TAKE 1 TABLET (20 MG TOTAL) BY MOUTH DAILY.   botulinum toxin Type A 100 units Solr injection Commonly known as: BOTOX Botox - Historical Medication  once every 3 months for migraines Active   Catapres 0.1 MG tablet Generic drug: cloNIDine Take 1 tablet (0.1 mg total) by mouth 2 (two) times daily.   cetirizine 10 MG tablet Commonly known as: ZYRTEC Take by mouth.   EPINEPHrine 0.3 mg/0.3 mL Soaj injection Commonly known as: EPI-PEN 0.3 mg once.   ezetimibe 10 MG tablet Commonly known as: ZETIA Take 10 mg by mouth daily.   fluticasone 110 MCG/ACT inhaler Commonly known as: FLOVENT HFA Inhale 2 puffs into the lungs daily.   frovatriptan 2.5 MG tablet Commonly known as: FROVA Take 2.5 mg by mouth as needed.   furosemide 20 MG tablet Commonly known as: LASIX furosemide 20 mg tablet  Take 1 tablet twice a day by oral route as needed.   lansoprazole 30 MG capsule Commonly known as: PREVACID Take 30 mg by mouth daily.   levothyroxine 50 MCG tablet Commonly known as: SYNTHROID   NON  FORMULARY Take by mouth 1 day or 1 dose.   Nurtec 75 MG Tbdp Generic drug: Rimegepant Sulfate Take by mouth.   oxyCODONE-acetaminophen 5-325 MG tablet Commonly known as: Percocet Take 1-2 tablets by mouth every 4 (four) hours as needed for moderate pain or severe pain. Started by: Hollice Espy, MD   promethazine 25 MG tablet Commonly known as: PHENERGAN promethazine 25 mg tablet   rizatriptan 10 MG tablet Commonly known as: MAXALT Take by mouth.   spironolactone 25 MG tablet Commonly known as: ALDACTONE   tamsulosin 0.4 MG Caps capsule Commonly known as: Flomax Take 1 capsule (0.4 mg total) by mouth daily. Started by: Hollice Espy, MD    TRIAMCINOLONE ACETONIDE (NASAL STEROIDS) Place 2 sprays into the nose at bedtime.   Ventolin HFA 108 (90 Base) MCG/ACT inhaler Generic drug: albuterol 1 inhalation as needed   Vitamin D (Ergocalciferol) 1.25 MG (50000 UNIT) Caps capsule Commonly known as: DRISDOL   zafirlukast 10 MG tablet Commonly known as: ACCOLATE Take 10 mg by mouth 2 (two) times daily.       Allergies:  Allergies  Allergen Reactions  . Flonase  [Fluticasone Propionate] Other (See Comments)  . Latex Itching  . Codeine     Rash/vomiting  . Fish Oil Itching  . Sulfa Antibiotics     abdominal pain    Family History: Family History  Problem Relation Age of Onset  . Hypertension Mother   . Breast cancer Mother 70  . Thyroid disease Mother   . Stroke Mother   . Dementia Mother   . Cardiomyopathy Father   . Peripheral Artery Disease Father   . Hyperlipidemia Brother   . Hypertension Brother   . Hypertension Brother   . Hyperlipidemia Brother   . Thyroid disease Brother   . Hypertension Brother   . Birth defects Brother   . Congenital heart disease Brother   . Cancer Brother        Kidney cancer  . Colon cancer Maternal Grandmother   . Stroke Paternal Grandfather   . Neuropathy Neg Hx   . Multiple sclerosis Neg Hx   . Migraines Neg Hx     Social History:  reports that she has never smoked. She has never used smokeless tobacco. She reports that she does not drink alcohol and does not use drugs.   Physical Exam: BP (!) 178/106   Pulse 89   Ht 5\' 5"  (1.651 m)   Wt 177 lb (80.3 kg)   BMI 29.45 kg/m    T 98.2 Constitutional:  Alert and oriented, mild distress holding emesis basin HEENT: [place] AT, moist mucus membranes.  Trachea midline, no masses. Cardiovascular: No clubbing, cyanosis, or edema. Respiratory: Normal respiratory effort, no increased work of breathing. Skin: No rashes, bruises or suspicious lesions. Neurologic: Grossly intact, no focal deficits, moving all 4  extremities. Psychiatric: Normal mood and affect.  Laboratory Data:  Lab Results  Component Value Date   CREATININE 0.89 02/09/2020   Urinalysis Calcium oxalate and microscopic hematuria.  Results for orders placed or performed during the hospital encounter of 04/19/20  Urinalysis, Complete w Microscopic  Result Value Ref Range   Color, Urine YELLOW YELLOW   APPearance CLOUDY (A) CLEAR   Specific Gravity, Urine >1.030 (H) 1.005 - 1.030   pH 5.0 5.0 - 8.0   Glucose, UA NEGATIVE NEGATIVE mg/dL   Hgb urine dipstick LARGE (A) NEGATIVE   Bilirubin Urine SMALL (A) NEGATIVE   Ketones, ur TRACE (A) NEGATIVE mg/dL  Protein, ur 30 (A) NEGATIVE mg/dL   Nitrite NEGATIVE NEGATIVE   Leukocytes,Ua NEGATIVE NEGATIVE   Squamous Epithelial / LPF NONE SEEN 0 - 5   WBC, UA 6-10 0 - 5 WBC/hpf   RBC / HPF >50 0 - 5 RBC/hpf   Bacteria, UA NONE SEEN NONE SEEN   Mucus PRESENT    Ca Oxalate Crys, UA PRESENT   CBC  Result Value Ref Range   WBC 11.2 (H) 4.0 - 10.5 K/uL   RBC 4.83 3.87 - 5.11 MIL/uL   Hemoglobin 14.4 12.0 - 15.0 g/dL   HCT 45.5 36 - 46 %   MCV 94.2 80.0 - 100.0 fL   MCH 29.8 26.0 - 34.0 pg   MCHC 31.6 30.0 - 36.0 g/dL   RDW 13.2 11.5 - 15.5 %   Platelets 260 150 - 400 K/uL   nRBC 0.0 0.0 - 0.2 %     Pertinent Imaging: Results for orders placed during the hospital encounter of 04/15/20  DG Abd 1 View  Narrative CLINICAL DATA:  64 year old with gross hematuria.  EXAM: ABDOMEN - 1 VIEW  COMPARISON:  11/20/2019  FINDINGS: Nonobstructive bowel gas pattern. Large amount of stool on the right side of the abdomen and pelvis. Multiple calcifications in the pelvis are similar to the previous examination and most compatible with phleboliths. Limited evaluation for renal calculi due to bowel gas and stool. Lung bases are clear.  IMPRESSION: 1. Large amount of stool on the right side of the abdomen and pelvis. Nonobstructive bowel gas pattern. 2. Limited evaluation for  renal calculi due to bowel gas and stool. 3. Stable pelvic calcifications are suggestive for phleboliths.   Electronically Signed By: Markus Daft M.D. On: 04/16/2020 10:58  I have personally reviewed the images and agree with radiologist interpretation.   No clear stone on CT scan  Assessment & Plan:    1. Left Flank pain  Based on UA and symptoms probable acute kidney stone  UA revealed microscopic blood and calcium oxalate  Stat labs for BMP and CBC today  Stat CT renal stone study today, will call w/ results  Rx of Flomax and Percocet sent to pharmacy  Toradol injection given today for pain  Warning symptoms reviewed   2. Gross hematuria Likely related to stones   [facility] [address], [place], [place] [address] [phone]  I, Lucas Mallow, am acting as a scribe for Dr. Hollice Espy,  I have reviewed the above documentation for accuracy and completeness, and I agree with the above.   Hollice Espy, MD  Addendum: Stat CT obtained shows a 4 mm left proximal ureteral calculus.  I called the patient to discuss management.  The Toradol that she received in the office was effective in controlling her pain and her nausea is improving.  At this point time, she feels okay to continue expectant management of the stone.  As per above, Flomax and pain medications were prescribed.  We reviewed warning symptoms again and indications for more urgent/emergent evaluation.  Given the stone is not visible on KUB, she is not a great candidate for ESWL.  She may consider ureteroscopy if her pain continues.  Also noted to my surgical scheduler to let her know that she may be calling to schedule a procedure in the form of ureteroscopy.  We discussed the risk and benefits including the risk of bleeding, infection, damage surrounding structures, stent pain amongst others.  I spent 60 total  minutes on the day of the encounter including pre-visit  review of the medical record, face-to-face time with the patient, and post visit ordering of labs/imaging/tests.

## 2020-04-19 NOTE — Progress Notes (Signed)
04/19/20 11:19 AM   Rachel Fry 08/19/1956 [id number]  Referring provider: [name], MD [address] [place],  [place] [address] Chief Complaint  Patient presents with  . New Patient (Initial Visit)    Hematuria    HPI: Rachel Fry is a 64 y.o. F w/ hx of kidney stones presents today for the evaluation and management of gross hematuria/ left flank pain.  Visited PCP on 04/12/20 diagnosed w/ gross hematuria. F/u KUB revealed large amount of stool on the right side of the abdomen and pelvis. Nonobstructive bowel gas pattern. Limited evaluation for renal calculi due to bowel gas and stool. Stable pelvic calcifications are suggestive for phleboliths.  She reports of gross hematuria onset 3 weeks ago w/ associated symptoms of severe flank pain concentrated on left side. She denies bothersome bladder symptoms.  In the office today, she is in mild distress, severely nauseous and in pain.  She does have a personal history of stones and underwent ureteroscopy in the past.  PMH: Past Medical History:  Diagnosis Date  . Allergy   . Asthma   . Migraines   . Numbness and tingling    left side of body, occasionally right side   . S/P Botox injection     Surgical History: Past Surgical History:  Procedure Laterality Date  . ARM NEUROPLASTY Right 1994 and 1996   for chronic pain  . CESAREAN SECTION    . OVARIAN CYST REMOVAL  1973  . TONSILLECTOMY AND ADENOIDECTOMY    . TUBAL LIGATION  1988    Home Medications:  Allergies as of 04/19/2020      Reactions   Flonase  [fluticasone Propionate] Other (See Comments)   Latex Itching   Codeine    Rash/vomiting   Fish Oil Itching   Sulfa Antibiotics    abdominal pain      Medication List       Accurate as of April 19, 2020 11:59 PM. If you have any questions, ask your nurse or doctor.        STOP taking these medications   butalbital-aspirin-caffeine 50-325-40 MG capsule Commonly known as:  FIORINAL Stopped by: Hollice Espy, MD     TAKE these medications   Aimovig 70 MG/ML Soaj Generic drug: Erenumab-aooe Inject into the skin every 30 (thirty) days.   atorvastatin 20 MG tablet Commonly known as: LIPITOR TAKE 1 TABLET (20 MG TOTAL) BY MOUTH DAILY.   botulinum toxin Type A 100 units Solr injection Commonly known as: BOTOX Botox - Historical Medication  once every 3 months for migraines Active   Catapres 0.1 MG tablet Generic drug: cloNIDine Take 1 tablet (0.1 mg total) by mouth 2 (two) times daily.   cetirizine 10 MG tablet Commonly known as: ZYRTEC Take by mouth.   EPINEPHrine 0.3 mg/0.3 mL Soaj injection Commonly known as: EPI-PEN 0.3 mg once.   ezetimibe 10 MG tablet Commonly known as: ZETIA Take 10 mg by mouth daily.   fluticasone 110 MCG/ACT inhaler Commonly known as: FLOVENT HFA Inhale 2 puffs into the lungs daily.   frovatriptan 2.5 MG tablet Commonly known as: FROVA Take 2.5 mg by mouth as needed.   furosemide 20 MG tablet Commonly known as: LASIX furosemide 20 mg tablet  Take 1 tablet twice a day by oral route as needed.   lansoprazole 30 MG capsule Commonly known as: PREVACID Take 30 mg by mouth daily.   levothyroxine 50 MCG tablet Commonly known as: SYNTHROID   NON  FORMULARY Take by mouth 1 day or 1 dose.   Nurtec 75 MG Tbdp Generic drug: Rimegepant Sulfate Take by mouth.   oxyCODONE-acetaminophen 5-325 MG tablet Commonly known as: Percocet Take 1-2 tablets by mouth every 4 (four) hours as needed for moderate pain or severe pain. Started by: Hollice Espy, MD   promethazine 25 MG tablet Commonly known as: PHENERGAN promethazine 25 mg tablet   rizatriptan 10 MG tablet Commonly known as: MAXALT Take by mouth.   spironolactone 25 MG tablet Commonly known as: ALDACTONE   tamsulosin 0.4 MG Caps capsule Commonly known as: Flomax Take 1 capsule (0.4 mg total) by mouth daily. Started by: Hollice Espy, MD    TRIAMCINOLONE ACETONIDE (NASAL STEROIDS) Place 2 sprays into the nose at bedtime.   Ventolin HFA 108 (90 Base) MCG/ACT inhaler Generic drug: albuterol 1 inhalation as needed   Vitamin D (Ergocalciferol) 1.25 MG (50000 UNIT) Caps capsule Commonly known as: DRISDOL   zafirlukast 10 MG tablet Commonly known as: ACCOLATE Take 10 mg by mouth 2 (two) times daily.       Allergies:  Allergies  Allergen Reactions  . Flonase  [Fluticasone Propionate] Other (See Comments)  . Latex Itching  . Codeine     Rash/vomiting  . Fish Oil Itching  . Sulfa Antibiotics     abdominal pain    Family History: Family History  Problem Relation Age of Onset  . Hypertension Mother   . Breast cancer Mother 74  . Thyroid disease Mother   . Stroke Mother   . Dementia Mother   . Cardiomyopathy Father   . Peripheral Artery Disease Father   . Hyperlipidemia Brother   . Hypertension Brother   . Hypertension Brother   . Hyperlipidemia Brother   . Thyroid disease Brother   . Hypertension Brother   . Birth defects Brother   . Congenital heart disease Brother   . Cancer Brother        Kidney cancer  . Colon cancer Maternal Grandmother   . Stroke Paternal Grandfather   . Neuropathy Neg Hx   . Multiple sclerosis Neg Hx   . Migraines Neg Hx     Social History:  reports that she has never smoked. She has never used smokeless tobacco. She reports that she does not drink alcohol and does not use drugs.   Physical Exam: BP (!) 178/106   Pulse 89   Ht 5\' 5"  (1.651 m)   Wt 177 lb (80.3 kg)   BMI 29.45 kg/m    T 98.2 Constitutional:  Alert and oriented, mild distress holding emesis basin HEENT: [place] AT, moist mucus membranes.  Trachea midline, no masses. Cardiovascular: No clubbing, cyanosis, or edema. Respiratory: Normal respiratory effort, no increased work of breathing. Skin: No rashes, bruises or suspicious lesions. Neurologic: Grossly intact, no focal deficits, moving all 4  extremities. Psychiatric: Normal mood and affect.  Laboratory Data:  Lab Results  Component Value Date   CREATININE 0.89 02/09/2020   Urinalysis Calcium oxalate and microscopic hematuria.  Results for orders placed or performed during the hospital encounter of 04/19/20  Urinalysis, Complete w Microscopic  Result Value Ref Range   Color, Urine YELLOW YELLOW   APPearance CLOUDY (A) CLEAR   Specific Gravity, Urine >1.030 (H) 1.005 - 1.030   pH 5.0 5.0 - 8.0   Glucose, UA NEGATIVE NEGATIVE mg/dL   Hgb urine dipstick LARGE (A) NEGATIVE   Bilirubin Urine SMALL (A) NEGATIVE   Ketones, ur TRACE (A) NEGATIVE mg/dL  Protein, ur 30 (A) NEGATIVE mg/dL   Nitrite NEGATIVE NEGATIVE   Leukocytes,Ua NEGATIVE NEGATIVE   Squamous Epithelial / LPF NONE SEEN 0 - 5   WBC, UA 6-10 0 - 5 WBC/hpf   RBC / HPF >50 0 - 5 RBC/hpf   Bacteria, UA NONE SEEN NONE SEEN   Mucus PRESENT    Ca Oxalate Crys, UA PRESENT   CBC  Result Value Ref Range   WBC 11.2 (H) 4.0 - 10.5 K/uL   RBC 4.83 3.87 - 5.11 MIL/uL   Hemoglobin 14.4 12.0 - 15.0 g/dL   HCT 45.5 36 - 46 %   MCV 94.2 80.0 - 100.0 fL   MCH 29.8 26.0 - 34.0 pg   MCHC 31.6 30.0 - 36.0 g/dL   RDW 13.2 11.5 - 15.5 %   Platelets 260 150 - 400 K/uL   nRBC 0.0 0.0 - 0.2 %     Pertinent Imaging: Results for orders placed during the hospital encounter of 04/15/20  DG Abd 1 View  Narrative CLINICAL DATA:  64 year old with gross hematuria.  EXAM: ABDOMEN - 1 VIEW  COMPARISON:  11/20/2019  FINDINGS: Nonobstructive bowel gas pattern. Large amount of stool on the right side of the abdomen and pelvis. Multiple calcifications in the pelvis are similar to the previous examination and most compatible with phleboliths. Limited evaluation for renal calculi due to bowel gas and stool. Lung bases are clear.  IMPRESSION: 1. Large amount of stool on the right side of the abdomen and pelvis. Nonobstructive bowel gas pattern. 2. Limited evaluation for  renal calculi due to bowel gas and stool. 3. Stable pelvic calcifications are suggestive for phleboliths.   Electronically Signed By: Markus Daft M.D. On: 04/16/2020 10:58  I have personally reviewed the images and agree with radiologist interpretation.   No clear stone on CT scan  Assessment & Plan:    1. Left Flank pain  Based on UA and symptoms probable acute kidney stone  UA revealed microscopic blood and calcium oxalate  Stat labs for BMP and CBC today  Stat CT renal stone study today, will call w/ results  Rx of Flomax and Percocet sent to pharmacy  Toradol injection given today for pain  Warning symptoms reviewed   2. Gross hematuria Likely related to stones   [facility] [address] [place], [place] [address] [phone]  I, Lucas Mallow, am acting as a scribe for Dr. Hollice Espy,  I have reviewed the above documentation for accuracy and completeness, and I agree with the above.   Hollice Espy, MD  Addendum: Stat CT obtained shows a 4 mm left proximal ureteral calculus.  I called the patient to discuss management.  The Toradol that she received in the office was effective in controlling her pain and her nausea is improving.  At this point time, she feels okay to continue expectant management of the stone.  As per above, Flomax and pain medications were prescribed.  We reviewed warning symptoms again and indications for more urgent/emergent evaluation.  Given the stone is not visible on KUB, she is not a great candidate for ESWL.  She may consider ureteroscopy if her pain continues.  Also noted to my surgical scheduler to let her know that she may be calling to schedule a procedure in the form of ureteroscopy.  We discussed the risk and benefits including the risk of bleeding, infection, damage surrounding structures, stent pain amongst others.  I spent 60 total  minutes on the day of the encounter including pre-visit  review of the medical record, face-to-face time with the patient, and post visit ordering of labs/imaging/tests.

## 2020-04-19 NOTE — Progress Notes (Signed)
IM Injection  Patient is present today for an IM Injection for treatment of Flank Pain Drug: Toradol  Dose:30mg / 56ml Location:Right Detoid Lot: [id number] Exp:06/2020 Patient tolerated well, no complications were noted  Preformed by: Fonnie Jarvis, CMA

## 2020-04-22 ENCOUNTER — Other Ambulatory Visit: Payer: Self-pay | Admitting: *Deleted

## 2020-04-22 DIAGNOSIS — R310 Gross hematuria: Secondary | ICD-10-CM

## 2020-04-22 MED FILL — FLOVENT HFA 110 MCG INHALER: 110 | 90 days supply | Qty: 36 | Fill #1

## 2020-04-22 MED FILL — SPIRONOLACTONE 25 MG TABS: 25 | 90 days supply | Qty: 180 | Fill #2

## 2020-04-23 ENCOUNTER — Emergency Department: Payer: 59

## 2020-04-23 ENCOUNTER — Inpatient Hospital Stay: Payer: 59 | Admitting: Anesthesiology

## 2020-04-23 ENCOUNTER — Other Ambulatory Visit: Payer: Self-pay

## 2020-04-23 ENCOUNTER — Encounter
Admission: EM | Disposition: A | Discharge status: Home / Self Care | Payer: Self-pay | Source: Home / Self Care | Attending: Internal Medicine

## 2020-04-23 ENCOUNTER — Telehealth: Payer: Self-pay | Admitting: Radiology

## 2020-04-23 ENCOUNTER — Inpatient Hospital Stay: Payer: 59

## 2020-04-23 ENCOUNTER — Encounter: Payer: Self-pay | Admitting: Emergency Medicine

## 2020-04-23 ENCOUNTER — Other Ambulatory Visit: Payer: 59

## 2020-04-23 ENCOUNTER — Inpatient Hospital Stay
Admission: EM | Admit: 2020-04-23 | Discharge: 2020-04-24 | DRG: 661 | Disposition: A | Discharge status: Home / Self Care | Payer: 59 | Attending: Internal Medicine | Admitting: Internal Medicine

## 2020-04-23 DIAGNOSIS — N200 Calculus of kidney: Secondary | ICD-10-CM | POA: Diagnosis not present

## 2020-04-23 DIAGNOSIS — D72829 Elevated white blood cell count, unspecified: Secondary | ICD-10-CM | POA: Diagnosis not present

## 2020-04-23 DIAGNOSIS — J45909 Unspecified asthma, uncomplicated: Secondary | ICD-10-CM | POA: Diagnosis present

## 2020-04-23 DIAGNOSIS — Z87442 Personal history of urinary calculi: Secondary | ICD-10-CM | POA: Diagnosis not present

## 2020-04-23 DIAGNOSIS — N201 Calculus of ureter: Secondary | ICD-10-CM | POA: Diagnosis not present

## 2020-04-23 DIAGNOSIS — N132 Hydronephrosis with renal and ureteral calculous obstruction: Secondary | ICD-10-CM | POA: Diagnosis not present

## 2020-04-23 DIAGNOSIS — E7800 Pure hypercholesterolemia, unspecified: Secondary | ICD-10-CM | POA: Diagnosis present

## 2020-04-23 DIAGNOSIS — N136 Pyonephrosis: Secondary | ICD-10-CM | POA: Diagnosis present

## 2020-04-23 DIAGNOSIS — Z8616 Personal history of COVID-19: Secondary | ICD-10-CM | POA: Diagnosis not present

## 2020-04-23 DIAGNOSIS — Z79890 Hormone replacement therapy (postmenopausal): Secondary | ICD-10-CM | POA: Diagnosis not present

## 2020-04-23 DIAGNOSIS — Z7951 Long term (current) use of inhaled steroids: Secondary | ICD-10-CM

## 2020-04-23 DIAGNOSIS — I878 Other specified disorders of veins: Secondary | ICD-10-CM | POA: Diagnosis not present

## 2020-04-23 DIAGNOSIS — R310 Gross hematuria: Secondary | ICD-10-CM | POA: Diagnosis present

## 2020-04-23 DIAGNOSIS — N390 Urinary tract infection, site not specified: Secondary | ICD-10-CM | POA: Diagnosis not present

## 2020-04-23 DIAGNOSIS — Z79899 Other long term (current) drug therapy: Secondary | ICD-10-CM | POA: Diagnosis not present

## 2020-04-23 DIAGNOSIS — I10 Essential (primary) hypertension: Secondary | ICD-10-CM | POA: Diagnosis present

## 2020-04-23 DIAGNOSIS — Z03818 Encounter for observation for suspected exposure to other biological agents ruled out: Secondary | ICD-10-CM | POA: Diagnosis not present

## 2020-04-23 DIAGNOSIS — R8281 Pyuria: Secondary | ICD-10-CM | POA: Diagnosis not present

## 2020-04-23 DIAGNOSIS — R109 Unspecified abdominal pain: Secondary | ICD-10-CM | POA: Diagnosis not present

## 2020-04-23 HISTORY — PX: CYSTOSCOPY W/ RETROGRADES: SHX1426

## 2020-04-23 HISTORY — PX: CYSTOSCOPY WITH STENT PLACEMENT: SHX5790

## 2020-04-23 LAB — URINALYSIS, COMPLETE (UACMP) WITH MICROSCOPIC
Bacteria, UA: NONE SEEN
Bilirubin Urine: NEGATIVE
Glucose, UA: NEGATIVE mg/dL
Ketones, ur: NEGATIVE mg/dL
Nitrite: NEGATIVE
Protein, ur: 100 mg/dL — AB
RBC / HPF: >50 RBC/hpf — ABNORMAL HIGH (ref 0–5)
Specific Gravity, Urine: 1.021 (ref 1.005–1.030)
Squamous Epithelial / HPF: NONE SEEN (ref 0–5)
WBC, UA: >50 WBC/hpf — ABNORMAL HIGH (ref 0–5)
pH: 5 (ref 5.0–8.0)

## 2020-04-23 LAB — CBC
HCT: 45.1 % (ref 36.0–46.0)
Hemoglobin: 14.3 g/dL (ref 12.0–15.0)
MCH: 30 pg (ref 26.0–34.0)
MCHC: 31.7 g/dL (ref 30.0–36.0)
MCV: 94.7 fL (ref 80.0–100.0)
Platelets: 241 10*3/uL (ref 150–400)
RBC: 4.76 MIL/uL (ref 3.87–5.11)
RDW: 13 % (ref 11.5–15.5)
WBC: 11.3 10*3/uL — ABNORMAL HIGH (ref 4.0–10.5)
nRBC: 0 % (ref 0.0–0.2)

## 2020-04-23 LAB — BASIC METABOLIC PANEL
Anion gap: 10 (ref 5–15)
BUN: 22 mg/dL (ref 8–23)
CO2: 29 mmol/L (ref 22–32)
Calcium: 10.1 mg/dL (ref 8.9–10.3)
Chloride: 101 mmol/L (ref 98–111)
Creatinine, Ser: 1.26 mg/dL — ABNORMAL HIGH (ref 0.44–1.00)
GFR calc Af Amer: 52 mL/min — ABNORMAL LOW (ref >60)
GFR calc non Af Amer: 45 mL/min — ABNORMAL LOW (ref >60)
Glucose, Bld: 112 mg/dL — ABNORMAL HIGH (ref 70–99)
Potassium: 3.5 mmol/L (ref 3.5–5.1)
Sodium: 140 mmol/L (ref 135–145)

## 2020-04-23 LAB — SARS CORONAVIRUS 2 BY RT PCR (HOSPITAL ORDER, PERFORMED IN ~~LOC~~ HOSPITAL LAB): SARS Coronavirus 2: NEGATIVE

## 2020-04-23 SURGERY — CYSTOSCOPY, WITH STENT INSERTION
Anesthesia: General | Site: Ureter | Laterality: Left

## 2020-04-23 MED ORDER — ALBUTEROL SULFATE (2.5 MG/3ML) 0.083% IN NEBU: 2.5000 mg | INHALATION_SOLUTION | Freq: Four times a day (QID) | RESPIRATORY_TRACT | Status: DC | PRN

## 2020-04-23 MED ORDER — ACETAMINOPHEN 325 MG PO TABS
650.0000 mg | ORAL_TABLET | Freq: Four times a day (QID) | ORAL | Status: DC | PRN
  Administered 2020-04-24: 650 mg via ORAL
  Filled 2020-04-23: qty 2

## 2020-04-23 MED ORDER — HYDROMORPHONE HCL 1 MG/ML IJ SOLN
0.5000 mg | Freq: Once | INTRAMUSCULAR | Status: AC
  Administered 2020-04-23: 0.5 mg via INTRAVENOUS
  Filled 2020-04-23: qty 1

## 2020-04-23 MED ORDER — ATORVASTATIN CALCIUM 20 MG PO TABS
20.0000 mg | ORAL_TABLET | Freq: Every day | ORAL | Status: DC
  Filled 2020-04-23: qty 1

## 2020-04-23 MED ORDER — ONDANSETRON HCL 4 MG/2ML IJ SOLN
INTRAMUSCULAR | Status: AC
  Filled 2020-04-23: qty 2

## 2020-04-23 MED ORDER — DEXTROSE IN LACTATED RINGERS 5 % IV SOLN: INTRAVENOUS | Status: DC

## 2020-04-23 MED ORDER — LABETALOL HCL 5 MG/ML IV SOLN
10.0000 mg | Freq: Once | INTRAVENOUS | Status: AC
  Administered 2020-04-23: 10 mg via INTRAVENOUS

## 2020-04-23 MED ORDER — SODIUM CHLORIDE 0.9 % IV SOLN
2.0000 g | Freq: Once | INTRAVENOUS | Status: AC
  Administered 2020-04-23: 2 g via INTRAVENOUS
  Filled 2020-04-23: qty 20

## 2020-04-23 MED ORDER — LACTATED RINGERS IV SOLN: INTRAVENOUS | Status: DC | PRN

## 2020-04-23 MED ORDER — PROPOFOL 10 MG/ML IV BOLUS
INTRAVENOUS | Status: DC | PRN
  Administered 2020-04-23: 120 mg via INTRAVENOUS

## 2020-04-23 MED ORDER — ENOXAPARIN SODIUM 40 MG/0.4ML ~~LOC~~ SOLN
40.0000 mg | SUBCUTANEOUS | Status: DC
  Filled 2020-04-23: qty 0.4

## 2020-04-23 MED ORDER — TRIAMCINOLONE ACETONIDE 55 MCG/ACT NA AERO
2.0000 | INHALATION_SPRAY | Freq: Every day | NASAL | Status: DC
  Filled 2020-04-23: qty 21.6

## 2020-04-23 MED ORDER — TAMSULOSIN HCL 0.4 MG PO CAPS
0.4000 mg | ORAL_CAPSULE | Freq: Every day | ORAL | Status: DC
  Administered 2020-04-24: 0.4 mg via ORAL
  Filled 2020-04-23: qty 1

## 2020-04-23 MED ORDER — FLUTICASONE PROPIONATE HFA 110 MCG/ACT IN AERO: 2.0000 | INHALATION_SPRAY | Freq: Every day | RESPIRATORY_TRACT | Status: DC

## 2020-04-23 MED ORDER — ONDANSETRON HCL 4 MG/2ML IJ SOLN: 4.0000 mg | Freq: Once | INTRAMUSCULAR | Status: AC | PRN

## 2020-04-23 MED ORDER — OXYCODONE-ACETAMINOPHEN 5-325 MG PO TABS: 1.0000 | ORAL_TABLET | ORAL | Status: DC | PRN

## 2020-04-23 MED ORDER — ACETAMINOPHEN 650 MG RE SUPP: 650.0000 mg | Freq: Four times a day (QID) | RECTAL | Status: DC | PRN

## 2020-04-23 MED ORDER — CLONIDINE HCL 0.1 MG PO TABS
0.1000 mg | ORAL_TABLET | Freq: Two times a day (BID) | ORAL | Status: DC
  Filled 2020-04-23 (×2): qty 1

## 2020-04-23 MED ORDER — MIDAZOLAM HCL 2 MG/2ML IJ SOLN
INTRAMUSCULAR | Status: AC
  Filled 2020-04-23: qty 2

## 2020-04-23 MED ORDER — ONDANSETRON HCL 4 MG/2ML IJ SOLN
4.0000 mg | Freq: Once | INTRAMUSCULAR | Status: AC
  Administered 2020-04-23: 4 mg via INTRAVENOUS
  Filled 2020-04-23: qty 2

## 2020-04-23 MED ORDER — ONDANSETRON HCL 4 MG/2ML IJ SOLN
INTRAMUSCULAR | Status: AC
  Administered 2020-04-23: 4 mg via INTRAVENOUS
  Filled 2020-04-23: qty 2

## 2020-04-23 MED ORDER — BUDESONIDE 0.5 MG/2ML IN SUSP
0.5000 mg | Freq: Every day | RESPIRATORY_TRACT | Status: DC
  Filled 2020-04-23: qty 2

## 2020-04-23 MED ORDER — SODIUM CHLORIDE 0.9 % IV SOLN
2.0000 g | INTRAVENOUS | Status: DC
  Administered 2020-04-24: 2 g via INTRAVENOUS
  Filled 2020-04-23 (×2): qty 20

## 2020-04-23 MED ORDER — LORATADINE 10 MG PO TABS
10.0000 mg | ORAL_TABLET | Freq: Every day | ORAL | Status: DC
  Filled 2020-04-23: qty 1

## 2020-04-23 MED ORDER — PANTOPRAZOLE SODIUM 40 MG PO TBEC
40.0000 mg | DELAYED_RELEASE_TABLET | Freq: Every day | ORAL | Status: DC
  Administered 2020-04-24: 40 mg via ORAL
  Filled 2020-04-23: qty 1

## 2020-04-23 MED ORDER — SUMATRIPTAN SUCCINATE 50 MG PO TABS
50.0000 mg | ORAL_TABLET | ORAL | Status: DC | PRN
  Filled 2020-04-23: qty 1

## 2020-04-23 MED ORDER — ONDANSETRON HCL 4 MG/2ML IJ SOLN
INTRAMUSCULAR | Status: DC | PRN
  Administered 2020-04-23: 4 mg via INTRAVENOUS

## 2020-04-23 MED ORDER — MONTELUKAST SODIUM 10 MG PO TABS
10.0000 mg | ORAL_TABLET | Freq: Every day | ORAL | Status: DC
  Filled 2020-04-23: qty 1

## 2020-04-23 MED ORDER — KETOROLAC TROMETHAMINE 30 MG/ML IJ SOLN: 15.0000 mg | Freq: Three times a day (TID) | INTRAMUSCULAR | Status: DC | PRN

## 2020-04-23 MED ORDER — LABETALOL HCL 5 MG/ML IV SOLN
5.0000 mg | Freq: Once | INTRAVENOUS | Status: AC
  Administered 2020-04-23: 5 mg via INTRAVENOUS

## 2020-04-23 MED ORDER — EPHEDRINE SULFATE 50 MG/ML IJ SOLN
INTRAMUSCULAR | Status: DC | PRN
  Administered 2020-04-23 (×2): 5 mg via INTRAVENOUS

## 2020-04-23 MED ORDER — PROPOFOL 10 MG/ML IV BOLUS
INTRAVENOUS | Status: AC
  Filled 2020-04-23: qty 20

## 2020-04-23 MED ORDER — LIDOCAINE HCL (CARDIAC) PF 100 MG/5ML IV SOSY
PREFILLED_SYRINGE | INTRAVENOUS | Status: DC | PRN
  Administered 2020-04-23: 30 mg via INTRAVENOUS

## 2020-04-23 MED ORDER — FENTANYL CITRATE (PF) 100 MCG/2ML IJ SOLN
INTRAMUSCULAR | Status: DC | PRN
  Administered 2020-04-23: 12.5 ug via INTRAVENOUS
  Administered 2020-04-23: 25 ug via INTRAVENOUS

## 2020-04-23 MED ORDER — EZETIMIBE 10 MG PO TABS
10.0000 mg | ORAL_TABLET | Freq: Every day | ORAL | Status: DC
  Administered 2020-04-23: 10 mg via ORAL
  Filled 2020-04-23 (×2): qty 1

## 2020-04-23 MED ORDER — LABETALOL HCL 5 MG/ML IV SOLN
INTRAVENOUS | Status: AC
  Filled 2020-04-23: qty 4

## 2020-04-23 MED ORDER — TRAMADOL HCL 50 MG PO TABS: 50.0000 mg | ORAL_TABLET | Freq: Four times a day (QID) | ORAL | Status: DC | PRN

## 2020-04-23 MED ORDER — ZAFIRLUKAST 20 MG PO TABS: 10.0000 mg | ORAL_TABLET | Freq: Two times a day (BID) | ORAL | Status: DC

## 2020-04-23 MED ORDER — HYDROMORPHONE HCL 1 MG/ML IJ SOLN: 0.5000 mg | Freq: Once | INTRAMUSCULAR | Status: DC

## 2020-04-23 MED ORDER — MIDAZOLAM HCL 2 MG/2ML IJ SOLN
INTRAMUSCULAR | Status: DC | PRN
  Administered 2020-04-23: 2 mg via INTRAVENOUS

## 2020-04-23 MED ORDER — FENTANYL CITRATE (PF) 100 MCG/2ML IJ SOLN
INTRAMUSCULAR | Status: AC
  Filled 2020-04-23: qty 2

## 2020-04-23 MED ORDER — FENTANYL CITRATE (PF) 100 MCG/2ML IJ SOLN
25.0000 ug | INTRAMUSCULAR | Status: DC | PRN
  Administered 2020-04-23 (×3): 25 ug via INTRAVENOUS

## 2020-04-23 MED ORDER — ONDANSETRON HCL 4 MG/2ML IJ SOLN: 4.0000 mg | Freq: Four times a day (QID) | INTRAMUSCULAR | Status: DC | PRN

## 2020-04-23 MED ORDER — LACTATED RINGERS IV BOLUS
1000.0000 mL | Freq: Once | INTRAVENOUS | Status: AC
  Administered 2020-04-23: 1000 mL via INTRAVENOUS

## 2020-04-23 MED ORDER — CLONIDINE HCL 0.1 MG PO TABS: 0.1000 mg | ORAL_TABLET | Freq: Two times a day (BID) | ORAL | Status: DC

## 2020-04-23 MED ORDER — FENTANYL CITRATE (PF) 100 MCG/2ML IJ SOLN
INTRAMUSCULAR | Status: AC
  Administered 2020-04-23: 25 ug via INTRAVENOUS
  Filled 2020-04-23: qty 2

## 2020-04-23 MED ORDER — IOHEXOL 180 MG/ML  SOLN
INTRAMUSCULAR | Status: DC | PRN
  Administered 2020-04-23: 10 mL

## 2020-04-23 MED ORDER — LEVOTHYROXINE SODIUM 50 MCG PO TABS
50.0000 ug | ORAL_TABLET | Freq: Every day | ORAL | Status: DC
  Administered 2020-04-24: 50 ug via ORAL
  Filled 2020-04-23: qty 1

## 2020-04-23 SURGICAL SUPPLY — 17 items
BAG DRAIN CYSTO-URO LG1000N (MISCELLANEOUS) ×2 IMPLANT
BRUSH SCRUB EZ 1% IODOPHOR (MISCELLANEOUS) ×2 IMPLANT
CATH URETL 5X70 OPEN END (CATHETERS) ×2 IMPLANT
GLOVE BIOGEL PI IND STRL 7.5 (GLOVE) ×1 IMPLANT
GLOVE BIOGEL PI INDICATOR 7.5 (GLOVE) ×1
GOWN STRL REUS W/ TWL XL LVL3 (GOWN DISPOSABLE) ×1 IMPLANT
GOWN STRL REUS W/TWL XL LVL3 (GOWN DISPOSABLE) ×1
GUIDEWIRE STR DUAL SENSOR (WIRE) ×2 IMPLANT
KIT TURNOVER CYSTO (KITS) ×2 IMPLANT
PACK CYSTO AR (MISCELLANEOUS) ×2 IMPLANT
SET CYSTO W/LG BORE CLAMP LF (SET/KITS/TRAYS/PACK) ×2 IMPLANT
SOL .9 NS 3000ML IRR  AL (IV SOLUTION) ×1
SOL .9 NS 3000ML IRR UROMATIC (IV SOLUTION) ×1 IMPLANT
STENT URET 6FRX24 CONTOUR (STENTS) ×2 IMPLANT
STENT URET 6FRX26 CONTOUR (STENTS) IMPLANT
SURGILUBE 2OZ TUBE FLIPTOP (MISCELLANEOUS) ×2 IMPLANT
WATER STERILE IRR 1000ML POUR (IV SOLUTION) ×2 IMPLANT

## 2020-04-23 NOTE — Anesthesia Preprocedure Evaluation (Signed)
Anesthesia Evaluation  Patient identified by MRN, date of birth, ID band Patient awake    Reviewed: Allergy & Precautions, H&P , NPO status , Patient's Chart, lab work & pertinent test results, reviewed documented beta blocker date and time   Airway Mallampati: II  TM Distance: >3 FB Neck ROM: full    Dental  (+) Teeth Intact   Pulmonary shortness of breath, asthma ,    Pulmonary exam normal        Cardiovascular Exercise Tolerance: Good negative cardio ROS Normal cardiovascular exam Rate:Normal     Neuro/Psych  Headaches, PSYCHIATRIC DISORDERS Anxiety Depression  Neuromuscular disease    GI/Hepatic GERD  Medicated,(+) Hepatitis -  Endo/Other  negative endocrine ROS  Renal/GU negative Renal ROS  negative genitourinary   Musculoskeletal   Abdominal   Peds  Hematology negative hematology ROS (+)   Anesthesia Other Findings   Reproductive/Obstetrics negative OB ROS                             Anesthesia Physical Anesthesia Plan  ASA: II  Anesthesia Plan: General LMA   Post-op Pain Management:    Induction:   PONV Risk Score and Plan:   Airway Management Planned:   Additional Equipment:   Intra-op Plan:   Post-operative Plan:   Informed Consent: I have reviewed the patients History and Physical, chart, labs and discussed the procedure including the risks, benefits and alternatives for the proposed anesthesia with the patient or authorized representative who has indicated his/her understanding and acceptance.       Plan Discussed with: CRNA  Anesthesia Plan Comments:         Anesthesia Quick Evaluation

## 2020-04-23 NOTE — ED Provider Notes (Signed)
[facility] Emergency Department Provider Note  ____________________________________________   First MD Initiated Contact with Patient 04/23/20 1258     (approximate)  I have reviewed the triage vital signs and the nursing notes.   HISTORY  Chief Complaint Flank Pain    HPI Rachel Fry is a 64 y.o. female  Here with flank pain. Pt has h/o recurrent kidney stones. Reports 2-3 weeks of severe L flank pain, for which she saw Dr. Erlene Quan on 6/25. She was found to have obstructing stone w/ plan for lithotripsy on Friday. She has been taking Ultram, phenergan. Over past 3-4 days, pain has progressively worsened and is now severe, unrelenting, 10/10. She's had chills, no known fevers. +nausea, vomiting. Pain worse intermittently, no specific alleviating or aggravating factors. Has been getting daily Toradol injections as well.        Past Medical History:  Diagnosis Date  . Allergy   . Asthma   . Migraines   . Numbness and tingling    left side of body, occasionally right side   . S/P Botox injection     Patient Active Problem List   Diagnosis Date Noted  . Intermittent chest pain 02/09/2020  . History of COVID-19 02/09/2020  . Shortness of breath 02/09/2020  . Loss of taste 02/09/2020  . Loss of smell 02/09/2020  . Rash 02/09/2020  . Arthralgia 02/09/2020  . Postoperative history of checked last year 04/22/2017  . Weakness of left arm 04/22/2017  . Numbness and tingling in left arm 04/22/2017  . Lower back pain 04/02/2016  . Acute stress disorder 02/25/2015  . Anxiety 02/25/2015  . Airway hyperreactivity 02/25/2015  . Bradycardia 02/25/2015  . Hypersomnia 02/25/2015  . Clinical depression 02/25/2015  . Elevated blood sugar 02/25/2015  . 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 Acid reflux 02/25/2015  . 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 H/O disease 02/25/2015  . Hypercholesteremia 02/25/2015  . Headache, migraine 02/25/2015  . Muscle ache 02/25/2015    . Allergic rhinitis, seasonal 02/25/2015  . Avitaminosis D 02/25/2015    Past Surgical History:  Procedure Laterality Date  . ARM NEUROPLASTY Right 1994 and 1996   for chronic pain  . CESAREAN SECTION    . OVARIAN CYST REMOVAL  1973  . TONSILLECTOMY AND ADENOIDECTOMY    . TUBAL LIGATION  1988    Prior to Admission medications   Medication Sig Start Date End Date Taking? Authorizing Provider  albuterol (VENTOLIN HFA) 108 (90 Base) MCG/ACT inhaler 1 inhalation as needed 07/22/17   [provider]  atorvastatin (LIPITOR) 20 MG tablet TAKE 1 TABLET (20 MG TOTAL) BY MOUTH DAILY. 09/24/16   Birdie Sons, MD  botulinum toxin Type A (BOTOX) 100 UNITS SOLR injection Botox - Historical Medication  once every 3 months for migraines Active    [provider]  cetirizine (ZYRTEC) 10 MG tablet Take by mouth. 02/04/12   [provider]  cloNIDine (CATAPRES) 0.1 MG tablet Take 1 tablet (0.1 mg total) by mouth 2 (two) times daily. 02/12/20   Minna Merritts, MD  EPINEPHrine 0.3 mg/0.3 mL IJ SOAJ injection 0.3 mg once.  10/15/16   [provider]  Erenumab-aooe (AIMOVIG) 70 MG/ML SOAJ Inject into the skin every 30 (thirty) days.    [provider]  ezetimibe (ZETIA) 10 MG tablet Take 10 mg by mouth daily. 11/14/19   [provider]  fluticasone (FLOVENT HFA) 110 MCG/ACT inhaler Inhale 2 puffs into the lungs daily.  [provider]  frovatriptan (FROVA) 2.5 MG tablet Take 2.5 mg by mouth as needed.     [provider]  furosemide (LASIX) 20 MG tablet furosemide 20 mg tablet  Take 1 tablet twice a day by oral route as needed.    [provider]  lansoprazole (PREVACID) 30 MG capsule Take 30 mg by mouth daily. 03/24/17   [provider]  levothyroxine (SYNTHROID, LEVOTHROID) 50 MCG tablet  07/21/18   [provider]  NON FORMULARY Take by mouth 1 day or 1 dose.    [provider]   oxyCODONE-acetaminophen (PERCOCET) 5-325 MG tablet Take 1-2 tablets by mouth every 4 (four) hours as needed for moderate pain or severe pain. 04/19/20   Hollice Espy, MD  promethazine (PHENERGAN) 25 MG tablet promethazine 25 mg tablet    [provider]  Rimegepant Sulfate (NURTEC) 75 MG TBDP Take by mouth.    [provider]  rizatriptan (MAXALT) 10 MG tablet Take by mouth. 01/03/20   [provider]  spironolactone (ALDACTONE) 25 MG tablet  12/26/18   [provider]  tamsulosin (FLOMAX) 0.4 MG CAPS capsule Take 1 capsule (0.4 mg total) by mouth daily. 04/19/20   Hollice Espy, MD  TRIAMCINOLONE ACETONIDE, NASAL STEROIDS, Place 2 sprays into the nose at bedtime.  02/04/12   [provider]  Vitamin D, Ergocalciferol, (DRISDOL) 1.25 MG (50000 UT) CAPS capsule  06/30/18   [provider]  zafirlukast (ACCOLATE) 10 MG tablet Take 10 mg by mouth 2 (two) times daily.  02/04/12   [provider]    Allergies Flonase  [fluticasone propionate], Latex, Codeine, Fish oil, and Sulfa antibiotics  Family History  Problem Relation Age of Onset  . Hypertension Mother   . Breast cancer Mother 81  . Thyroid disease Mother   . Stroke Mother   . Dementia Mother   . Cardiomyopathy Father   . Peripheral Artery Disease Father   . Hyperlipidemia Brother   . Hypertension Brother   . Hypertension Brother   . Hyperlipidemia Brother   . Thyroid disease Brother   . Hypertension Brother   . Birth defects Brother   . Congenital heart disease Brother   . Cancer Brother        Kidney cancer  . Colon cancer Maternal Grandmother   . Stroke Paternal Grandfather   . Neuropathy Neg Hx   . Multiple sclerosis Neg Hx   . Migraines Neg Hx     Social History Social History   Tobacco Use  . Smoking status: Never Smoker  . Smokeless tobacco: Never Used  Vaping Use  . Vaping Use: Never used  Substance Use Topics  . Alcohol use: No  . Drug use: No     Review of Systems  Review of Systems  Constitutional: Negative for fatigue and fever.  HENT: Negative for congestion and sore throat.   Eyes: Negative for visual disturbance.  Respiratory: Negative for cough and shortness of breath.   Cardiovascular: Negative for chest pain.  Gastrointestinal: Negative for abdominal pain, diarrhea, nausea and vomiting.  Genitourinary: Positive for flank pain, frequency and hematuria.  Musculoskeletal: Negative for back pain and neck pain.  Skin: Negative for rash and wound.  Neurological: Negative for weakness.  All other systems reviewed and are negative.    ____________________________________________  PHYSICAL EXAM:      VITAL SIGNS: ED Triage Vitals  Enc Vitals Group     BP 04/23/20 1137 (!) 171/90  Pulse Rate 04/23/20 1137 (!) 103     Resp 04/23/20 1137 14     Temp 04/23/20 1137 97.8 F (36.6 C)     Temp Source 04/23/20 1137 Oral     SpO2 04/23/20 1137 97 %     Weight 04/23/20 1135 172 lb (78 kg)     Height 04/23/20 1135 5\' 5"  (1.651 m)     Head Circumference --      Peak Flow --      Pain Score 04/23/20 1135 8     Pain Loc --      Pain Edu? --      Excl. in [place]? --      Physical Exam Vitals and nursing note reviewed.  Constitutional:      General: She is not in acute distress.    Appearance: She is well-developed.  HENT:     Head: Normocephalic and atraumatic.  Eyes:     Conjunctiva/sclera: Conjunctivae normal.  Cardiovascular:     Rate and Rhythm: Normal rate and regular rhythm.     Heart sounds: Normal heart sounds. No murmur heard.  No friction rub.  Pulmonary:     Effort: Pulmonary effort is normal. No respiratory distress.     Breath sounds: Normal breath sounds. No wheezing or rales.  Abdominal:     General: There is no distension.     Palpations: Abdomen is soft.     Tenderness: There is no abdominal tenderness.     Comments: Moderate diffuse LUQ, left flank TTP. No reboudn or guarding.   Musculoskeletal:     Cervical back: Neck supple.  Skin:    General: Skin is warm.     Capillary Refill: Capillary refill takes less than 2 seconds.  Neurological:     Mental Status: She is alert and oriented to person, place, and time.     Motor: No abnormal muscle tone.       ____________________________________________   LABS (all labs ordered are listed, but only abnormal results are displayed)  Labs Reviewed  BASIC METABOLIC PANEL - Abnormal; Notable for the following components:      Result Value   Glucose, Bld 112 (*)    Creatinine, Ser 1.26 (*)    GFR calc non Af Amer 45 (*)    GFR calc Af Amer 52 (*)    All other components within normal limits  CBC - Abnormal; Notable for the following components:   WBC 11.3 (*)    All other components within normal limits  URINALYSIS, COMPLETE (UACMP) WITH MICROSCOPIC - Abnormal; Notable for the following components:   Color, Urine AMBER (*)    APPearance CLOUDY (*)    Hgb urine dipstick LARGE (*)    Protein, ur 100 (*)    Leukocytes,Ua TRACE (*)    RBC / HPF >50 (*)    WBC, UA >50 (*)    All other components within normal limits  SARS CORONAVIRUS 2 BY RT PCR (HOSPITAL ORDER, [facility])  URINE CULTURE    ____________________________________________  EKG: None ________________________________________  RADIOLOGY All imaging, including plain films, CT scans, and ultrasounds, independently reviewed by me, and interpretations confirmed via formal radiology reads.  ED MD interpretation:   Reviewed CT Stone from 6/25 - 2 x 2 x 4 mm left ureteral stone w/ fullness of collecting system  Official radiology report(s): No results found.  ____________________________________________  PROCEDURES   Procedure(s) performed (including Critical Care):  Procedures  ____________________________________________  INITIAL IMPRESSION / MDM / ASSESSMENT AND PLAN / ED COURSE  As part of my medical  decision making, I reviewed the following data within the [place] notes reviewed and incorporated, Old chart reviewed, Notes from prior ED visits, and [place] Controlled Substance Database       *Rachel Fry was evaluated in Emergency Department on 04/23/2020 for the symptoms described in the history of present illness. She was evaluated in the context of the global COVID-19 pandemic, which necessitated consideration that the patient might be at risk for infection with the SARS-CoV-2 virus that causes COVID-19. Institutional protocols and algorithms that pertain to the evaluation of patients at risk for COVID-19 are in a state of rapid change based on information released by regulatory bodies including the CDC and federal and state organizations. These policies and algorithms were followed during the patient's care in the ED.  Some ED evaluations and interventions may be delayed as a result of limited staffing during the pandemic.*     Medical Decision Making:  64 yo F here with ongoing L flank pain 2/2 kidneys stone. Pt now with dehydration, leukocytosis, and pyuria though no known fevers. Does not appear septic clinically. Will consult Urology, start fluids. COVID test sent.  Discussed with Dr. Bernardo Heater. Will start on Rocephin, fluids, admit to Hospitalist. Likely stent today. Pt updated and in agreement. Labs reviewed as above - mild dehydration, leukocytosis noted.  ____________________________________________  FINAL CLINICAL IMPRESSION(S) / ED DIAGNOSES  Final diagnoses:  Nephrolithiasis     MEDICATIONS GIVEN DURING THIS VISIT:  Medications  lactated ringers bolus 1,000 mL (1,000 mLs Intravenous New Bag/Given 04/23/20 1333)  ondansetron (ZOFRAN) injection 4 mg (4 mg Intravenous Given 04/23/20 1328)  HYDROmorphone (DILAUDID) injection 0.5 mg (0.5 mg Intravenous Given 04/23/20 1329)  cefTRIAXone (ROCEPHIN) 2 g in sodium chloride 0.9 % 100 mL IVPB (2 g Intravenous  New Bag/Given 04/23/20 1335)     ED Discharge Orders    None       Note:  This document was prepared using Dragon voice recognition software and may include unintentional dictation errors.   Duffy Bruce, MD 04/23/20 [phone]

## 2020-04-23 NOTE — Anesthesia Procedure Notes (Signed)
Procedure Name: LMA Insertion Date/Time: 04/23/2020 6:30 PM Performed by: Lendon Colonel, CRNA Pre-anesthesia Checklist: Patient identified, Patient being monitored, Timeout performed, Emergency Drugs available and Suction available Patient Re-evaluated:Patient Re-evaluated prior to induction Oxygen Delivery Method: Circle system utilized Preoxygenation: Pre-oxygenation with 100% oxygen Induction Type: IV induction Ventilation: Mask ventilation without difficulty LMA: LMA inserted LMA Size: 4.0 Tube type: Oral Number of attempts: 2 Placement Confirmation: positive ETCO2 and breath sounds checked- equal and bilateral Tube secured with: Tape Dental Injury: Teeth and Oropharynx as per pre-operative assessment

## 2020-04-23 NOTE — Consult Note (Signed)
Urology Consult  Requesting physician: Dr. Ellender Hose  Reason consultation: Renal colic, possible UTI  Chief Complaint: Kidney stone  History of Present Illness: Rachel Fry is a 64 y.o. female with a history of recurrent stone disease.  She saw Dr. Erlene Quan on 04/19/2020 for evaluation of gross hematuria and left flank pain.  A stone protocol CT was performed which was remarkable for 4 mm left proximal ureteral calculus.  She presented to the ED today with a 3-4-day history of progressively worsening left flank pain radiating to the lower abdomen.  Her pain today was not controlled by Toradol and Ultram necessitating an ED visit.  Urinalysis showed greater than 50 WBCs and she had mild leukocytosis.  She has had some chills but no fever.  Past Medical History:  Diagnosis Date  . Allergy   . Asthma   . Migraines   . Numbness and tingling    left side of body, occasionally right side   . S/P Botox injection     Past Surgical History:  Procedure Laterality Date  . ARM NEUROPLASTY Right 1994 and 1996   for chronic pain  . CESAREAN SECTION    . OVARIAN CYST REMOVAL  1973  . TONSILLECTOMY AND ADENOIDECTOMY    . TUBAL LIGATION  1988    Home Medications:  No outpatient medications have been marked as taking for the 04/23/20 encounter [facility] Encounter).    Allergies:  Allergies  Allergen Reactions  . Flonase  [Fluticasone Propionate] Other (See Comments)  . Latex Itching  . Codeine     Rash/vomiting  . Fish Oil Itching  . Sulfa Antibiotics     abdominal pain    Family History  Problem Relation Age of Onset  . Hypertension Mother   . Breast cancer Mother 60  . Thyroid disease Mother   . Stroke Mother   . Dementia Mother   . Cardiomyopathy Father   . Peripheral Artery Disease Father   . Hyperlipidemia Brother   . Hypertension Brother   . Hypertension Brother   . Hyperlipidemia Brother   . Thyroid disease Brother   . Hypertension Brother   . Birth defects  Brother   . Congenital heart disease Brother   . Cancer Brother        Kidney cancer  . Colon cancer Maternal Grandmother   . Stroke Paternal Grandfather   . Neuropathy Neg Hx   . Multiple sclerosis Neg Hx   . Migraines Neg Hx     Social History:  reports that she has never smoked. She has never used smokeless tobacco. She reports that she does not drink alcohol and does not use drugs.  ROS: A complete review of systems was performed.  All systems are negative except for pertinent findings as noted.  Physical Exam:  Vital signs in last 24 hours: Temp:  [97.8 F (36.6 C)] 97.8 F (36.6 C) (06/29 1137) Pulse Rate:  [99-103] 99 (06/29 1340) Resp:  [14-15] 15 (06/29 1340) BP: (171-172)/(90-100) 172/100 (06/29 1340) SpO2:  [97 %-98 %] 98 % (06/29 1340) Weight:  [78 kg] 78 kg (06/29 1135) Constitutional:  Alert and oriented, No acute distress HEENT: [place] AT, moist mucus membranes.  Trachea midline, no masses Cardiovascular: Regular rate and rhythm, no clubbing, cyanosis, or edema. Respiratory: Normal respiratory effort, lungs clear bilaterally Skin: No rashes, bruises or suspicious lesions Lymph: No cervical or inguinal adenopathy Neurologic: Grossly intact, no focal deficits, moving all 4 extremities Psychiatric: Normal mood and affect  Laboratory Data:  Recent Labs    04/23/20 1145  WBC 11.3*  HGB 14.3  HCT 45.1   Recent Labs    04/23/20 1145  NA 140  K 3.5  CL 101  CO2 29  GLUCOSE 112*  BUN 22  CREATININE 1.26*  CALCIUM 10.1   No results for input(s): LABPT, INR in the last 72 hours. No results for input(s): LABURIN in the last 72 hours. Results for orders placed or performed during the hospital encounter of 04/23/20  SARS Coronavirus 2 by RT PCR (hospital order, performed in [facility] hospital lab) Nasopharyngeal Nasopharyngeal Swab     Status: None   Collection Time: 04/23/20  2:08 PM   Specimen: Nasopharyngeal Swab  Result Value Ref Range Status   SARS  Coronavirus 2 NEGATIVE NEGATIVE Final    Comment: (NOTE) SARS-CoV-2 target nucleic acids are NOT DETECTED.  The SARS-CoV-2 RNA is generally detectable in upper and lower respiratory specimens during the acute phase of infection. The lowest concentration of SARS-CoV-2 viral copies this assay can detect is 250 copies / mL. A negative result does not preclude SARS-CoV-2 infection and should not be used as the sole basis for treatment or other patient management decisions.  A negative result may occur with improper specimen collection / handling, submission of specimen other than nasopharyngeal swab, presence of viral mutation(s) within the areas targeted by this assay, and inadequate number of viral copies (<250 copies / mL). A negative result must be combined with clinical observations, patient history, and epidemiological information.  Fact Sheet for Patients:   StrictlyIdeas.no  Fact Sheet for Healthcare Providers: BankingDealers.co.za  This test is not yet approved or  cleared by the [place] FDA and has been authorized for detection and/or diagnosis of SARS-CoV-2 by FDA under an Emergency Use Authorization (EUA).  This EUA will remain in effect (meaning this test can be used) for the duration of the COVID-19 declaration under Section 564(b)(1) of the Act, 21 U.S.C. section 360bbb-3(b)(1), unless the authorization is terminated or revoked sooner.  Performed at [facility], [address], [place], [place] [address]      Radiologic Imaging: Stone protocol CT of the abdomen pelvis performed 04/19/2020 was personally reviewed  Impression/Assessment:  64 y.o. female with a 4 mm left ureteral calculus and recurrent severe renal colic today.  Since her visit with Dr. Erlene Quan on 6/25 she has developed leukocytosis and significant pyuria at >50 WBCs.  Plan:  -Recommended urgent cystoscopy with left ureteral stent  placement -She will be admitted to the hospital service for IV antibiotics/overnight observation -The procedure was discussed in detail including potential risks bleeding, infection/sepsis.  She has had a previous stent and tolerated fairly well.  She indicated all questions were answered and desires to proceed.  04/23/2020, 4:39 PM  John Giovanni,  MD

## 2020-04-23 NOTE — ED Triage Notes (Signed)
Pt reports had a CT scan done last week, has a kidney stone on the left side but cannot have anything done about it until next week. Pt called her MD and was advised to come to the ED for pain control. Pt reports will have procedure to have it removed next week.

## 2020-04-23 NOTE — Transfer of Care (Signed)
Immediate Anesthesia Transfer of Care Note  Patient: Rachel Fry  Procedure(s) Performed: CYSTOSCOPY WITH STENT PLACEMENT (Left Ureter) CYSTOSCOPY WITH RETROGRADE PYELOGRAM (Left Ureter)  Patient Location: PACU  Anesthesia Type:General  Level of Consciousness: awake, alert , oriented and patient cooperative  Airway & Oxygen Therapy: Patient Spontanous Breathing and Patient connected to face mask oxygen  Post-op Assessment: Report given to RN and Post -op Vital signs reviewed and stable  Post vital signs: Reviewed and stable  Last Vitals:  Vitals Value Taken Time  BP 181/99 04/23/20 1906  Temp 36.3 C 04/23/20 1906  Pulse 101 04/23/20 1908  Resp 20 04/23/20 1908  SpO2 100 % 04/23/20 1908  Vitals shown include unvalidated device data.  Last Pain:  Vitals:   04/23/20 1906  TempSrc:   PainSc: 0-No pain         Complications: No complications documented.

## 2020-04-23 NOTE — Op Note (Signed)
Preoperative diagnosis:  1. Obstructing left ureteral calculus 2. Leukocytosis 3. Pyuria  Postoperative diagnosis:  1. Same  Procedure:  1. Cystoscopy 2. Left ureteral stent placement 6FR/24 cm 3. Left retrograde pyelography with interpretation  Surgeon: Nicki Reaper C. Caitlynn Ju, M.D.  Anesthesia: General  Complications: None  Intraoperative findings:  1. Left retrograde pyelogram-calculus not definitely visualized however mild hydronephrosis and ureteral dilation down to the distal ureter  EBL: Minimal  Specimens: None  Indication: Rachel Fry is a 64 y.o. female recently seen in our office for left flank pain and CT showed a 4 mm left proximal ureteral calculus.  She was seen in the ED today for severe flank pain and chills.  Urinalysis showed >50 WBCs and she had mild leukocytosis.  After reviewing the management options for treatment, he elected to proceed with the above surgical procedure(s). We have discussed the potential benefits and risks of the procedure, side effects of the proposed treatment, the likelihood of the patient achieving the goals of the procedure, and any potential problems that might occur during the procedure or recuperation. Informed consent has been obtained.  Description of procedure:  The patient was taken to the operating room and general anesthesia was induced.  The patient was placed in the dorsal lithotomy position, prepped and draped in the usual sterile fashion, and preoperative antibiotics were administered. A preoperative time-out was performed.   A 21 French cystoscope was lubricated and passed per urethra.  The bladder was then systematically examined in its entirety. There was no evidence for any bladder tumors, stones, or other mucosal pathology.  UOs were normal-appearing with clear efflux  Attention then turned to the left ureteral orifice and a ureteral catheter was used to intubate the ureteral orifice.  Omnipaque contrast was injected  through the ureteral catheter and a retrograde pyelogram was performed with findings as dictated above.  A 0.38 sensor guidewire was then advanced up the ureter into the renal pelvis under fluoroscopic guidance.  The wire was then backloaded through the cystoscope and a 6 French/24 cm Contour ureteral stent was advance over the wire using Seldinger technique.  The stent was positioned appropriately under fluoroscopic and cystoscopic guidance.  The wire was then removed with an adequate stent curl noted in the renal pelvis as well as in the bladder.  The bladder was then emptied and the procedure ended.  The patient appeared to tolerate the procedure well and without complications.  After anesthetic reversal the patient was transported to the PACU in satisfactory condition.   Plan: We will await urine cultures.  It appears the stone may have migrated to the distal ureter and it is possible the stone may pass alongside the stent.  Will obtain KUB to see if the stone visualized alongside the stent.   John Giovanni, MD

## 2020-04-23 NOTE — Telephone Encounter (Signed)
Patient reports severe pain related to kidney stone unrelieved with Toradol and Ultram and nausea unrelieved by promethazine. Denies fever, chills or vomiting. Advised patient to go to the emergency room for evaluation. Patient expresses understanding.

## 2020-04-23 NOTE — H&P (Addendum)
History and Physical:    Rachel Fry   MEQ:[id number] DOB: [birth year] DOA: 04/23/2020  Referring MD/provider: Duffy Bruce, MD PCP: [name], MD   Patient coming from: Home  Chief Complaint: Left flank pain  History of Present Illness:   Rachel Fry is a 64 y.o. female with medical history significant for allergies, asthma, migraine headaches, history of kidney stone, presented to the hospital because of severe flank pain.  She said she has been having left-sided flank pain for the past 3 to 4 days.  Pain was sharp in nature and it was intermittent. It was quite severe.  It radiates from the left flank across to the lower abdomen.  She said the pain has also developed in the right flank.  She said she was diagnosed with left-sided kidney stone by CT renal stone study on 04/19/2020.  She was scheduled to have a procedure to remove the stone and the next week.  However, her pain became unbearable and she did not get any relief even after taking Toradol and Ultram.  Therefore, she presented to the ED for further evaluation.  The hospitalist team was consulted to admit the patient.  ED Course: Urinalysis showed microscopic hematuria and pyuria concerning for acute UTI.  She was given IV Rocephin, IV Dilaudid and IV Zofran and IV fluids in the ED.    ROS:   ROS all other systems reviewed were negative  Past Medical History:   Past Medical History:  Diagnosis Date  . Allergy   . Asthma   . Migraines   . Numbness and tingling    left side of body, occasionally right side   . S/P Botox injection     Past Surgical History:   Past Surgical History:  Procedure Laterality Date  . ARM NEUROPLASTY Right 1994 and 1996   for chronic pain  . CESAREAN SECTION    . OVARIAN CYST REMOVAL  1973  . TONSILLECTOMY AND ADENOIDECTOMY    . TUBAL LIGATION  1988    Social History:   Social History   Socioeconomic History  . Marital status: Married    Spouse name: [name]  .  Number of children: 3  . Years of education: 55  . Highest education level: Not on file  Occupational History  . Occupation: Cardiab rehab  Tobacco Use  . Smoking status: Never Smoker  . Smokeless tobacco: Never Used  Vaping Use  . Vaping Use: Never used  Substance and Sexual Activity  . Alcohol use: No  . Drug use: No  . Sexual activity: Not on file  Other Topics Concern  . Not on file  Social History Narrative   Lives with husband   Caffeine use: none   Left handed   Social Determinants of Health   Financial Resource Strain:   . Difficulty of Paying Living Expenses:   Food Insecurity:   . Worried About Charity fundraiser in the Last Year:   . Arboriculturist in the Last Year:   Transportation Needs:   . Film/video editor (Medical):   Marland Kitchen Lack of Transportation (Non-Medical):   Physical Activity:   . Days of Exercise per Week:   . Minutes of Exercise per Session:   Stress:   . Feeling of Stress :   Social Connections:   . Frequency of Communication with Friends and Family:   . Frequency of Social Gatherings with Friends and Family:   . Attends Religious Services:   .  Active Member of Clubs or Organizations:   . Attends Archivist Meetings:   Marland Kitchen Marital Status:   Intimate Partner Violence:   . Fear of Current or Ex-Partner:   . Emotionally Abused:   Marland Kitchen Physically Abused:   . Sexually Abused:     Allergies   Flonase  [fluticasone propionate], Latex, Codeine, Fish oil, and Sulfa antibiotics  Family history:   Family History  Problem Relation Age of Onset  . Hypertension Mother   . Breast cancer Mother 34  . Thyroid disease Mother   . Stroke Mother   . Dementia Mother   . Cardiomyopathy Father   . Peripheral Artery Disease Father   . Hyperlipidemia Brother   . Hypertension Brother   . Hypertension Brother   . Hyperlipidemia Brother   . Thyroid disease Brother   . Hypertension Brother   . Birth defects Brother   . Congenital heart  disease Brother   . Cancer Brother        Kidney cancer  . Colon cancer Maternal Grandmother   . Stroke Paternal Grandfather   . Neuropathy Neg Hx   . Multiple sclerosis Neg Hx   . Migraines Neg Hx     Current Medications:   Prior to Admission medications   Medication Sig Start Date End Date Taking? Authorizing Provider  albuterol (VENTOLIN HFA) 108 (90 Base) MCG/ACT inhaler 1 inhalation as needed 07/22/17   [provider]  atorvastatin (LIPITOR) 20 MG tablet TAKE 1 TABLET (20 MG TOTAL) BY MOUTH DAILY. 09/24/16   Birdie Sons, MD  botulinum toxin Type A (BOTOX) 100 UNITS SOLR injection Botox - Historical Medication  once every 3 months for migraines Active    [provider]  cetirizine (ZYRTEC) 10 MG tablet Take by mouth. 02/04/12   [provider]  cloNIDine (CATAPRES) 0.1 MG tablet Take 1 tablet (0.1 mg total) by mouth 2 (two) times daily. 02/12/20   Minna Merritts, MD  EPINEPHrine 0.3 mg/0.3 mL IJ SOAJ injection 0.3 mg once.  10/15/16   [provider]  Erenumab-aooe (AIMOVIG) 70 MG/ML SOAJ Inject into the skin every 30 (thirty) days.    [provider]  ezetimibe (ZETIA) 10 MG tablet Take 10 mg by mouth daily. 11/14/19   [provider]  fluticasone (FLOVENT HFA) 110 MCG/ACT inhaler Inhale 2 puffs into the lungs daily.     [provider]  frovatriptan (FROVA) 2.5 MG tablet Take 2.5 mg by mouth as needed.     [provider]  furosemide (LASIX) 20 MG tablet furosemide 20 mg tablet  Take 1 tablet twice a day by oral route as needed.    [provider]  lansoprazole (PREVACID) 30 MG capsule Take 30 mg by mouth daily. 03/24/17   [provider]  levothyroxine (SYNTHROID, LEVOTHROID) 50 MCG tablet  07/21/18   [provider]  NON FORMULARY Take by mouth 1 day or 1 dose.    [provider]  oxyCODONE-acetaminophen (PERCOCET) 5-325 MG tablet Take 1-2 tablets by mouth every 4  (four) hours as needed for moderate pain or severe pain. 04/19/20   Hollice Espy, MD  promethazine (PHENERGAN) 25 MG tablet promethazine 25 mg tablet    [provider]  Rimegepant Sulfate (NURTEC) 75 MG TBDP Take by mouth.    [provider]  rizatriptan (MAXALT) 10 MG tablet Take by mouth. 01/03/20   [provider]  spironolactone (ALDACTONE) 25 MG tablet  12/26/18   [provider]  tamsulosin (FLOMAX) 0.4 MG CAPS capsule Take 1 capsule (0.4 mg total) by mouth daily. 04/19/20   Hollice Espy, MD  TRIAMCINOLONE ACETONIDE, NASAL STEROIDS, Place 2 sprays into the nose at bedtime.  02/04/12   [provider]  Vitamin D, Ergocalciferol, (DRISDOL) 1.25 MG (50000 UT) CAPS capsule  06/30/18   [provider]  zafirlukast (ACCOLATE) 10 MG tablet Take 10 mg by mouth 2 (two) times daily.  02/04/12   [provider]    Physical Exam:   Vitals:   04/23/20 1135 04/23/20 1137 04/23/20 1340  BP:  (!) 171/90 (!) 172/100  Pulse:  (!) 103 99  Resp:  14 15  Temp:  97.8 F (36.6 C)   TempSrc:  Oral   SpO2:  97% 98%  Weight: 78 kg    Height: 5\' 5"  (1.651 m)       Physical Exam: Blood pressure (!) 172/100, pulse 99, temperature 97.8 F (36.6 C), temperature source Oral, resp. rate 15, height 5\' 5"  (1.651 m), weight 78 kg, SpO2 98 %. Gen: No acute distress. Head: Normocephalic, atraumatic. Eyes: Pupils equal, round and reactive to light. Extraocular movements intact.  Sclerae nonicteric.  Mouth: Moist mucosal membranes.  No lesions seen in the mouth.. Neck: Supple, no thyromegaly, no lymphadenopathy, no jugular venous distention. Chest: Lungs are clear to auscultation with good air movement. No rales, rhonchi or wheezes.  CV: Heart sounds are regular with an S1, S2. No murmurs, rubs or gallops.  Abdomen: Soft, nontender, nondistended with normal active bowel sounds. No palpable masses. GU: Mild right CVA tenderness but no left CVA  tenderness Extremities: Extremities are without clubbing, or cyanosis. No edema. Pedal pulses 2+.  Skin: Warm and dry. No rashes, lesions or wounds Neuro: Alert and oriented times 3; grossly nonfocal.  Psych: Insight is good and judgment is appropriate. Mood and affect normal.   Data Review:    Labs: Basic Metabolic Panel: Recent Labs  Lab 04/23/20 1145  NA 140  K 3.5  CL 101  CO2 29  GLUCOSE 112*  BUN 22  CREATININE 1.26*  CALCIUM 10.1   Liver Function Tests: No results for input(s): AST, ALT, ALKPHOS, BILITOT, PROT, ALBUMIN in the last 168 hours. No results for input(s): LIPASE, AMYLASE in the last 168 hours. No results for input(s): AMMONIA in the last 168 hours. CBC: Recent Labs  Lab 04/19/20 1511 04/23/20 1145  WBC 11.2* 11.3*  HGB 14.4 14.3  HCT 45.5 45.1  MCV 94.2 94.7  PLT 260 241   Cardiac Enzymes: No results for input(s): CKTOTAL, CKMB, CKMBINDEX, TROPONINI in the last 168 hours.  BNP (last 3 results) No results for input(s): PROBNP in the last 8760 hours. CBG: No results for input(s): GLUCAP in the last 168 hours.  Urinalysis    Component Value Date/Time   COLORURINE AMBER (A) 04/23/2020 1145   APPEARANCEUR CLOUDY (A) 04/23/2020 1145   APPEARANCEUR Hazy 08/29/2014 1112   LABSPEC 1.021 04/23/2020 1145   LABSPEC 1.023 08/29/2014 1112   PHURINE 5.0 04/23/2020 1145   GLUCOSEU NEGATIVE 04/23/2020 1145   GLUCOSEU Negative 08/29/2014 1112   HGBUR LARGE (A) 04/23/2020 1145   BILIRUBINUR NEGATIVE 04/23/2020 1145   BILIRUBINUR neg 03/28/2015 1439   BILIRUBINUR Negative 08/29/2014 1112   KETONESUR NEGATIVE 04/23/2020 1145   PROTEINUR 100 (A) 04/23/2020 1145   UROBILINOGEN 0.2 03/28/2015 1439   NITRITE NEGATIVE 04/23/2020 1145   LEUKOCYTESUR TRACE (A) 04/23/2020 1145   LEUKOCYTESUR Negative 08/29/2014 1112  Radiographic Studies: No results found.     Assessment/Plan:   Principal Problem:   Left ureteral stone Active Problems:    Acute UTI  Acute proximal left ureteral stone: Admit to MedSurg.  IV Dilaudid for pain control.  IV fluids for hydration.  Urologist, Dr. Bernardo Heater has already been consulted by ED physician, Dr. Ellender Hose.  He plans to perform left ureteral stent placement later today.  Probable acute UTI: Treat with IV Rocephin.  Follow-up urine cultures.  Hypertension: BP probably elevated because of acute pain.  Continue antihypertensives.  Migraine headache: Continue home medicines.  Asthma: Compensated.  Continue home medicines.  Body mass index is 28.62 kg/m.  Other information:   DVT prophylaxis: Lovenox  Code Status: Full code. Family Communication: Plan discussed with patient Disposition Plan: Possible discharge to home in 2 days Consults called: Neurologist, Dr. Bernardo Heater Admission status: Inpatient    The medical decision making is of moderate complexity, therefore this is a level 2 visit.  Time spent 50 minutes  [facility]   How to contact the [facility] Attending or Consulting provider [place] or covering provider during after hours [place], for this patient?   1. Check the care team in [facility] and look for a) attending/consulting [facility] provider listed and b) the [facility] team listed 2. Log into [email or web address] and use [facility]'s universal password to access. If you do not have the password, please contact the hospital operator. 3. Locate the [facility] provider you are looking for under [facility] and page to a number that you can be directly reached. 4. If you still have difficulty reaching the provider, please page the [facility] (Director on Call) for the Hospitalists listed on amion for assistance.  04/23/2020, 3:12 PM

## 2020-04-24 ENCOUNTER — Encounter: Payer: Self-pay | Admitting: Urology

## 2020-04-24 ENCOUNTER — Inpatient Hospital Stay: Payer: 59

## 2020-04-24 ENCOUNTER — Other Ambulatory Visit: Payer: Self-pay | Admitting: Radiology

## 2020-04-24 DIAGNOSIS — N1330 Unspecified hydronephrosis: Secondary | ICD-10-CM

## 2020-04-24 DIAGNOSIS — N201 Calculus of ureter: Secondary | ICD-10-CM

## 2020-04-24 LAB — CBC
HCT: 41.5 % (ref 36.0–46.0)
Hemoglobin: 13 g/dL (ref 12.0–15.0)
MCH: 30.2 pg (ref 26.0–34.0)
MCHC: 31.3 g/dL (ref 30.0–36.0)
MCV: 96.3 fL (ref 80.0–100.0)
Platelets: 226 10*3/uL (ref 150–400)
RBC: 4.31 MIL/uL (ref 3.87–5.11)
RDW: 13.2 % (ref 11.5–15.5)
WBC: 7.4 10*3/uL (ref 4.0–10.5)
nRBC: 0 % (ref 0.0–0.2)

## 2020-04-24 LAB — BASIC METABOLIC PANEL
Anion gap: 6 (ref 5–15)
BUN: 15 mg/dL (ref 8–23)
CO2: 31 mmol/L (ref 22–32)
Calcium: 9.3 mg/dL (ref 8.9–10.3)
Chloride: 106 mmol/L (ref 98–111)
Creatinine, Ser: 1.03 mg/dL — ABNORMAL HIGH (ref 0.44–1.00)
GFR calc Af Amer: >60 mL/min (ref >60)
GFR calc non Af Amer: 57 mL/min — ABNORMAL LOW (ref >60)
Glucose, Bld: 109 mg/dL — ABNORMAL HIGH (ref 70–99)
Potassium: 3.8 mmol/L (ref 3.5–5.1)
Sodium: 143 mmol/L (ref 135–145)

## 2020-04-24 LAB — HIV ANTIBODY (ROUTINE TESTING W REFLEX): HIV Screen 4th Generation wRfx: NONREACTIVE

## 2020-04-24 MED ORDER — OXYBUTYNIN CHLORIDE 5 MG PO TABS: 5.0000 mg | ORAL_TABLET | Freq: Three times a day (TID) | ORAL | 0 refills | Status: DC | PRN

## 2020-04-24 MED ORDER — CEPHALEXIN 500 MG PO CAPS: 500.0000 mg | ORAL_CAPSULE | Freq: Three times a day (TID) | ORAL | 0 refills | Status: AC

## 2020-04-24 NOTE — Progress Notes (Signed)
Patient discharge instruction reviewed and given as well as medication changes.  Patient verbalized understanding. Awaiting transportation

## 2020-04-24 NOTE — Discharge Instructions (Signed)
Dietary Guidelines to Help Prevent Kidney Stones Kidney stones are deposits of minerals and salts that form inside your kidneys. Your risk of developing kidney stones may be greater depending on your diet, your lifestyle, the medicines you take, and whether you have certain medical conditions. Most people can reduce their chances of developing kidney stones by following the instructions below. Depending on your overall health and the type of kidney stones you tend to develop, your dietitian may give you more specific instructions. What are tips for following this plan? Reading food labels  Choose foods with "no salt added" or "low-salt" labels. Limit your sodium intake to less than 1500 mg per day.  Choose foods with calcium for each meal and snack. Try to eat about 300 mg of calcium at each meal. Foods that contain 200-500 mg of calcium per serving include: ? 8 oz (237 ml) of milk, fortified nondairy milk, and fortified fruit juice. ? 8 oz (237 ml) of kefir, yogurt, and soy yogurt. ? 4 oz (118 ml) of tofu. ? 1 oz of cheese. ? 1 cup (300 g) of dried figs. ? 1 cup (91 g) of cooked broccoli. ? 1-3 oz can of sardines or mackerel.  Most people need 1000 to 1500 mg of calcium each day. Talk to your dietitian about how much calcium is recommended for you. Shopping  Buy plenty of fresh fruits and vegetables. Most people do not need to avoid fruits and vegetables, even if they contain nutrients that may contribute to kidney stones.  When shopping for convenience foods, choose: ? Whole pieces of fruit. ? Premade salads with dressing on the side. ? Low-fat fruit and yogurt smoothies.  Avoid buying frozen meals or prepared deli foods.  Look for foods with live cultures, such as yogurt and kefir. Cooking  Do not add salt to food when cooking. Place a salt shaker on the table and allow each person to add his or her own salt to taste.  Use vegetable protein, such as beans, textured vegetable  protein (TVP), or tofu instead of meat in pasta, casseroles, and soups. Meal planning   Eat less salt, if told by your dietitian. To do this: ? Avoid eating processed or premade food. ? Avoid eating fast food.  Eat less animal protein, including cheese, meat, poultry, or fish, if told by your dietitian. To do this: ? Limit the number of times you have meat, poultry, fish, or cheese each week. Eat a diet free of meat at least 2 days a week. ? Eat only one serving each day of meat, poultry, fish, or seafood. ? When you prepare animal protein, cut pieces into small portion sizes. For most meat and fish, one serving is about the size of one deck of cards.  Eat at least 5 servings of fresh fruits and vegetables each day. To do this: ? Keep fruits and vegetables on hand for snacks. ? Eat 1 piece of fruit or a handful of berries with breakfast. ? Have a salad and fruit at lunch. ? Have two kinds of vegetables at dinner.  Limit foods that are high in a substance called oxalate. These include: ? Spinach. ? Rhubarb. ? Beets. ? Potato chips and french fries. ? Nuts.  If you regularly take a diuretic medicine, make sure to eat at least 1-2 fruits or vegetables high in potassium each day. These include: ? Avocado. ? Banana. ? Orange, prune, carrot, or tomato juice. ? Baked potato. ? Cabbage. ? Beans and split   peas. General instructions   Drink enough fluid to keep your urine clear or pale yellow. This is the most important thing you can do.  Talk to your health care provider and dietitian about taking daily supplements. Depending on your health and the cause of your kidney stones, you may be advised: ? Not to take supplements with vitamin C. ? To take a calcium supplement. ? To take a daily probiotic supplement. ? To take other supplements such as magnesium, fish oil, or vitamin B6.  Take all medicines and supplements as told by your health care provider.  Limit alcohol intake to no  more than 1 drink a day for nonpregnant women and 2 drinks a day for men. One drink equals 12 oz of beer, 5 oz of wine, or 1 oz of hard liquor.  Lose weight if told by your health care provider. Work with your dietitian to find strategies and an eating plan that works best for you. What foods are not recommended? Limit your intake of the following foods, or as told by your dietitian. Talk to your dietitian about specific foods you should avoid based on the type of kidney stones and your overall health. Grains Breads. Bagels. Rolls. Baked goods. Salted crackers. Cereal. Pasta. Vegetables Spinach. Rhubarb. Beets. Canned vegetables. Pickles. Olives. Meats and other protein foods Nuts. Nut butters. Large portions of meat, poultry, or fish. Salted or cured meats. Deli meats. Hot dogs. Sausages. Dairy Cheese. Beverages Regular soft drinks. Regular vegetable juice. Seasonings and other foods Seasoning blends with salt. Salad dressings. Canned soups. Soy sauce. Ketchup. Barbecue sauce. Canned pasta sauce. Casseroles. Pizza. Lasagna. Frozen meals. Potato chips. French fries. Summary  You can reduce your risk of kidney stones by making changes to your diet.  The most important thing you can do is drink enough fluid. You should drink enough fluid to keep your urine clear or pale yellow.  Ask your health care provider or dietitian how much protein from animal sources you should eat each day, and also how much salt and calcium you should have each day. This information is not intended to replace advice given to you by your health care provider. Make sure you discuss any questions you have with your health care provider. Document Revised: 02/01/2019 Document Reviewed: 09/22/2016 Elsevier Patient Education  2020 [employer]  

## 2020-04-24 NOTE — Discharge Summary (Signed)
[facility] NAME: Rachel Fry    MR#:  [id number]  DATE OF BIRTH:  12-06-1955  DATE OF ADMISSION:  04/23/2020 ADMITTING PHYSICIAN: Jennye Boroughs, MD  DATE OF DISCHARGE: 04/24/2020  PRIMARY CARE PHYSICIAN: [name], MD    ADMISSION DIAGNOSIS:  Nephrolithiasis [N20.0] Acute UTI [N39.0]  DISCHARGE DIAGNOSIS:  left ureteral stone status post left ureteral stent placement by Dr. Bernardo Heater UTI SECONDARY DIAGNOSIS:   Past Medical History:  Diagnosis Date  . Allergy   . Asthma   . Migraines   . Numbness and tingling    left side of body, occasionally right side   . S/P Botox injection     HOSPITAL COURSE:  Rachel Fry is a 64 y.o. female with medical history significant for allergies, asthma, migraine headaches, history of kidney stone, presented to the hospital because of severe flank pain.  She said she has been having left-sided flank pain for the past 3 to 4 days.  Pain was sharp in nature and it was intermittent. It was quite severe.  It radiates from the left flank across to the lower abdomen.  Acute proximal left ureteral stone: -  IV Dilaudid for pain control.  -Received  IV fluids for hydration.   -Urologist, Dr. Bernardo Heater -- status post left ureteral stent placement on June 29 -continue Flomax -PRN oxybutynin -KUB today does not show left ureteral stone. Few known kidney stones present   acute UTI: Treat with IV Rocephin.  Follow-up urine cultures pending -change to oral Keflex per urology recommendation  Hypertension: BP probably elevated because of acute pain.  Continue antihypertensives.  Migraine headache: Continue home medicines.  Asthma: Compensated.  Continue home medicines.  Overall hemodynamically stable. Patient feels back to baseline. Will discharge to home. Okay from urology standpoint to discharge.  CONSULTS OBTAINED:  Treatment Team:  Abbie Sons, MD  DRUG ALLERGIES:    Allergies  Allergen Reactions  . Flonase  [Fluticasone Propionate] Other (See Comments)  . Latex Itching  . Codeine     Rash/vomiting  . Fish Oil Itching  . Sulfa Antibiotics     abdominal pain    DISCHARGE MEDICATIONS:   Allergies as of 04/24/2020      Reactions   Flonase  [fluticasone Propionate] Other (See Comments)   Latex Itching   Codeine    Rash/vomiting   Fish Oil Itching   Sulfa Antibiotics    abdominal pain      Medication List    STOP taking these medications   ketorolac 30 MG/ML injection Commonly known as: TORADOL   promethazine 25 MG tablet Commonly known as: PHENERGAN     TAKE these medications   Aimovig 70 MG/ML Soaj Generic drug: Erenumab-aooe Inject into the skin every 30 (thirty) days.   atorvastatin 20 MG tablet Commonly known as: LIPITOR TAKE 1 TABLET (20 MG TOTAL) BY MOUTH DAILY.   botulinum toxin Type A 100 units Solr injection Commonly known as: BOTOX Botox - Historical Medication  once every 3 months for migraines Active   Catapres 0.1 MG tablet Generic drug: cloNIDine Take 1 tablet (0.1 mg total) by mouth 2 (two) times daily.   cephALEXin 500 MG capsule Commonly known as: KEFLEX Take 1 capsule (500 mg total) by mouth 3 (three) times daily for 5 days.   cetirizine 10 MG tablet Commonly known as: ZYRTEC Take by mouth.   EPINEPHrine 0.3 mg/0.3 mL Soaj injection Commonly known as: EPI-PEN 0.3 mg  once.   ezetimibe 10 MG tablet Commonly known as: ZETIA Take 10 mg by mouth daily.   fluticasone 110 MCG/ACT inhaler Commonly known as: FLOVENT HFA Inhale 2 puffs into the lungs daily.   frovatriptan 2.5 MG tablet Commonly known as: FROVA Take 2.5 mg by mouth as needed.   furosemide 20 MG tablet Commonly known as: LASIX furosemide 20 mg tablet  Take 1 tablet twice a day by oral route as needed.   lansoprazole 30 MG capsule Commonly known as: PREVACID Take 30 mg by mouth daily.   levothyroxine 50 MCG tablet Commonly  known as: SYNTHROID   NON FORMULARY Take by mouth 1 day or 1 dose.   Nurtec 75 MG Tbdp Generic drug: Rimegepant Sulfate Take by mouth.   oxybutynin 5 MG tablet Commonly known as: DITROPAN Take 1 tablet (5 mg total) by mouth 3 (three) times daily as needed for bladder spasms.   oxyCODONE-acetaminophen 5-325 MG tablet Commonly known as: Percocet Take 1-2 tablets by mouth every 4 (four) hours as needed for moderate pain or severe pain.   predniSONE 5 MG tablet Commonly known as: DELTASONE Take 5 mg by mouth daily with breakfast.   rizatriptan 10 MG tablet Commonly known as: MAXALT Take by mouth.   spironolactone 25 MG tablet Commonly known as: ALDACTONE   tamsulosin 0.4 MG Caps capsule Commonly known as: Flomax Take 1 capsule (0.4 mg total) by mouth daily.   traMADol 50 MG tablet Commonly known as: ULTRAM Take 50 mg by mouth every 6 (six) hours as needed.   TRIAMCINOLONE ACETONIDE (NASAL STEROIDS) Place 2 sprays into the nose at bedtime.   Ventolin HFA 108 (90 Base) MCG/ACT inhaler Generic drug: albuterol 1 inhalation as needed   Vitamin D (Ergocalciferol) 1.25 MG (50000 UNIT) Caps capsule Commonly known as: DRISDOL   zafirlukast 10 MG tablet Commonly known as: ACCOLATE Take 10 mg by mouth 2 (two) times daily.       If you experience worsening of your admission symptoms, develop shortness of breath, life threatening emergency, suicidal or homicidal thoughts you must seek medical attention immediately by calling 911 or calling your MD immediately  if symptoms less severe.  You Must read complete instructions/literature along with all the possible adverse reactions/side effects for all the Medicines you take and that have been prescribed to you. Take any new Medicines after you have completely understood and accept all the possible adverse reactions/side effects.   Please note  You were cared for by a hospitalist during your hospital stay. If you have any  questions about your discharge medications or the care you received while you were in the hospital after you are discharged, you can call the unit and asked to speak with the hospitalist on call if the hospitalist that took care of you is not available. Once you are discharged, your primary care physician will handle any further medical issues. Please note that NO REFILLS for any discharge medications will be authorized once you are discharged, as it is imperative that you return to your primary care physician (or establish a relationship with a primary care physician if you do not have one) for your aftercare needs so that they can reassess your need for medications and monitor your lab values. Today   SUBJECTIVE   No new complaints  VITAL SIGNS:  Blood pressure (!) 150/80, pulse 80, temperature 98.3 F (36.8 C), temperature source Oral, resp. rate 18, height 5\' 5"  (1.651 m), weight 78 kg, SpO2 94 %.  I/O:    Intake/Output Summary (Last 24 hours) at 04/24/2020 1122 Last data filed at 04/24/2020 0600 Gross per 24 hour  Intake 1468.36 ml  Output 800 ml  Net 668.36 ml    PHYSICAL EXAMINATION:  GENERAL:  64 y.o.-year-old patient lying in the bed with no acute distress.  EYES: Pupils equal, round, reactive to light and accommodation. No scleral icterus.  HEENT: Head atraumatic, normocephalic. Oropharynx and nasopharynx clear.  NECK:  Supple, no jugular venous distention. No thyroid enlargement, no tenderness.  LUNGS: Normal breath sounds bilaterally, no wheezing, rales,rhonchi or crepitation. No use of accessory muscles of respiration.  CARDIOVASCULAR: S1, S2 normal. No murmurs, rubs, or gallops.  ABDOMEN: Soft, non-tender, non-distended. Bowel sounds present. No organomegaly or mass.  EXTREMITIES: No pedal edema, cyanosis, or clubbing.  NEUROLOGIC: Cranial nerves II through XII are intact. Muscle strength 5/5 in all extremities. Sensation intact. Gait not checked.  PSYCHIATRIC: The patient  is alert and oriented x 3.  SKIN: No obvious rash, lesion, or ulcer.   DATA REVIEW:   CBC  Recent Labs  Lab 04/24/20 0549  WBC 7.4  HGB 13.0  HCT 41.5  PLT 226    Chemistries  Recent Labs  Lab 04/24/20 0549  NA 143  K 3.8  CL 106  CO2 31  GLUCOSE 109*  BUN 15  CREATININE 1.03*  CALCIUM 9.3    Microbiology Results   Recent Results (from the past 240 hour(s))  SARS Coronavirus 2 by RT PCR (hospital order, performed in [facility] hospital lab) Nasopharyngeal Nasopharyngeal Swab     Status: None   Collection Time: 04/23/20  2:08 PM   Specimen: Nasopharyngeal Swab  Result Value Ref Range Status   SARS Coronavirus 2 NEGATIVE NEGATIVE Final    Comment: (NOTE) SARS-CoV-2 target nucleic acids are NOT DETECTED.  The SARS-CoV-2 RNA is generally detectable in upper and lower respiratory specimens during the acute phase of infection. The lowest concentration of SARS-CoV-2 viral copies this assay can detect is 250 copies / mL. A negative result does not preclude SARS-CoV-2 infection and should not be used as the sole basis for treatment or other patient management decisions.  A negative result may occur with improper specimen collection / handling, submission of specimen other than nasopharyngeal swab, presence of viral mutation(s) within the areas targeted by this assay, and inadequate number of viral copies (<250 copies / mL). A negative result must be combined with clinical observations, patient history, and epidemiological information.  Fact Sheet for Patients:   StrictlyIdeas.no  Fact Sheet for Healthcare Providers: BankingDealers.co.za  This test is not yet approved or  cleared by the [place] FDA and has been authorized for detection and/or diagnosis of SARS-CoV-2 by FDA under an Emergency Use Authorization (EUA).  This EUA will remain in effect (meaning this test can be used) for the duration of the COVID-19  declaration under Section 564(b)(1) of the Act, 21 U.S.C. section 360bbb-3(b)(1), unless the authorization is terminated or revoked sooner.  Performed at [facility], [address] [place], [place] [address]     RADIOLOGY:  [place] Abd 1 View  Result Date: 04/24/2020 CLINICAL DATA:  Left flank pain EXAM: ABDOMEN - 1 VIEW COMPARISON:  November 20, 2019 abdominal radiograph; CT abdomen and pelvis April 19, 2020 FINDINGS: There is a double-J stent on the left extending from the L1-2 level on the left to the level of the bladder. There are stable apparent phleboliths in the pelvis. No other abnormal calcifications  are demonstrable. There is moderate stool in the colon. There is no bowel dilatation or air-fluid level to suggest bowel obstruction. No free air. IMPRESSION: Double-J stent on the left. Apparent phleboliths in the pelvis. No other abnormal calcifications appreciable. Moderate stool in colon.  No bowel obstruction or free air evident. Electronically Signed   By: Lowella Grip III M.D.   On: 04/24/2020 10:39   DG OR UROLOGY CYSTO IMAGE ([facility] ONLY)  Result Date: 04/23/2020 There is no interpretation for this exam.  This order is for images obtained during a surgical procedure.  Please See "Surgeries" Tab for more information regarding the procedure.     CODE STATUS:     Code Status Orders  (From admission, onward)         Start     Ordered   04/23/20 1513  Full code  Continuous        04/23/20 1513        Code Status History    This patient has a current code status but no historical code status.   Advance Care Planning Activity    Advance Directive Documentation     Most Recent Value  Type of Advance Directive Healthcare Power of Attorney, Living will  Pre-existing out of facility DNR order (yellow form or pink MOST form) --  "MOST" Form in Place? --       TOTAL TIME TAKING CARE OF THIS PATIENT: 40 minutes.    Fritzi Mandes M.D  [facility]     CC: Primary care physician; [name], MD

## 2020-04-24 NOTE — Progress Notes (Signed)
Urology Inpatient Progress Note  Subjective: Rachel Fry is a 64 y.o. female admitted on 04/23/2020 with worsening renal colic associated with a 87mm proximal left ureteral stone, now s/p left ureteral stent placement with Dr. Bernardo Heater. Intraoperative findings notable for no visualized stone and hydroureter to the level of the distal left ureter suspicious for distal stone migration versus passage.   Admission labs notable for pyuria; urine culture pending. On antibiotics as below.  Creatinine down today, 1.03. WBC count down today, 7.4. She is afebrile, VSS.  Patient reports resolution of intermittent, severe flank pain since stent placement. She does report occasional crampy discomfort of her left flank and gross hematuria that is gradually improving. She has not seen a stone pass.  Anti-infectives: Anti-infectives (From admission, onward)   Start     Dose/Rate Route Frequency Ordered Stop   04/24/20 1000  cefTRIAXone (ROCEPHIN) 2 g in sodium chloride 0.9 % 100 mL IVPB     Discontinue     2 g 200 mL/hr over 30 Minutes Intravenous Every 24 hours 04/23/20 1513     04/23/20 1330  cefTRIAXone (ROCEPHIN) 2 g in sodium chloride 0.9 % 100 mL IVPB        2 g 200 mL/hr over 30 Minutes Intravenous  Once 04/23/20 1318 04/23/20 1405      Current Facility-Administered Medications  Medication Dose Route Frequency Provider Last Rate Last Admin  . acetaminophen (TYLENOL) tablet 650 mg  650 mg Oral Q6H PRN Jennye Boroughs, MD   650 mg at 04/24/20 0544   Or  . acetaminophen (TYLENOL) suppository 650 mg  650 mg Rectal Q6H PRN Jennye Boroughs, MD      . albuterol (PROVENTIL) (2.5 MG/3ML) 0.083% nebulizer solution 2.5 mg  2.5 mg Inhalation Q6H PRN Jennye Boroughs, MD      . atorvastatin (LIPITOR) tablet 20 mg  20 mg Oral Daily Jennye Boroughs, MD      . budesonide (PULMICORT) nebulizer solution 0.5 mg  0.5 mg Nebulization Daily Jennye Boroughs, MD      . cefTRIAXone (ROCEPHIN) 2 g in sodium chloride 0.9 % 100  mL IVPB  2 g Intravenous Q24H Jennye Boroughs, MD      . cloNIDine (CATAPRES) tablet 0.1 mg  0.1 mg Oral BID Jennye Boroughs, MD      . dextrose 5 % in lactated ringers infusion   Intravenous Continuous Jennye Boroughs, MD 75 mL/hr at 04/23/20 2012 New Bag at 04/23/20 2012  . enoxaparin (LOVENOX) injection 40 mg  40 mg Subcutaneous Q24H Jennye Boroughs, MD      . ezetimibe (ZETIA) tablet 10 mg  10 mg Oral Daily Jennye Boroughs, MD   10 mg at 04/23/20 2148  . HYDROmorphone (DILAUDID) injection 0.5 mg  0.5 mg Intravenous Once Jennye Boroughs, MD      . ketorolac (TORADOL) 30 MG/ML injection 15 mg  15 mg Intravenous Q8H PRN Stoioff, Scott C, MD      . levothyroxine (SYNTHROID) tablet 50 mcg  50 mcg Oral Q0600 Jennye Boroughs, MD   50 mcg at 04/24/20 0542  . loratadine (CLARITIN) tablet 10 mg  10 mg Oral Daily Jennye Boroughs, MD      . montelukast (SINGULAIR) tablet 10 mg  10 mg Oral QHS Jennye Boroughs, MD      . ondansetron [facility]) injection 4 mg  4 mg Intravenous Q6H PRN Jennye Boroughs, MD      . pantoprazole (PROTONIX) EC tablet 40 mg  40 mg Oral Daily Jennye Boroughs,  MD      . SUMAtriptan (IMITREX) tablet 50 mg  50 mg Oral Q2H PRN Jennye Boroughs, MD      . tamsulosin (FLOMAX) capsule 0.4 mg  0.4 mg Oral Daily Jennye Boroughs, MD      . traMADol Veatrice Bourbon) tablet 50 mg  50 mg Oral Q6H PRN Stoioff, Scott C, MD      . triamcinolone (NASACORT) nasal inhaler 2 spray  2 spray Nasal QHS Jennye Boroughs, MD       Objective: Vital signs in last 24 hours: Temp:  [97.3 F (36.3 C)-99 F (37.2 C)] 98.1 F (36.7 C) (06/30 0452) Pulse Rate:  [73-103] 76 (06/30 0452) Resp:  [14-21] 15 (06/29 2238) BP: (113-183)/(67-100) 137/67 (06/30 0452) SpO2:  [92 %-100 %] 94 % (06/30 0452) Weight:  [78 kg] 78 kg (06/29 1135)  Intake/Output from previous day: 06/29 0701 - 06/30 0700 In: 1468.4 [P.O.:360; I.V.:1108.4] Out: 800 [Urine:800] Intake/Output this shift: No intake/output data recorded.  Physical Exam Vitals  and nursing note reviewed.  Constitutional:      General: She is not in acute distress.    Appearance: She is not ill-appearing, toxic-appearing or diaphoretic.  HENT:     Head: Normocephalic and atraumatic.  Pulmonary:     Effort: Pulmonary effort is normal. No respiratory distress.  Skin:    General: Skin is warm and dry.  Neurological:     Mental Status: She is alert and oriented to person, place, and time.  Psychiatric:        Mood and Affect: Mood normal.        Behavior: Behavior normal.    Lab Results:  Recent Labs    04/23/20 1145 04/24/20 0549  WBC 11.3* 7.4  HGB 14.3 13.0  HCT 45.1 41.5  PLT 241 226   BMET Recent Labs    04/23/20 1145 04/24/20 0549  NA 140 143  K 3.5 3.8  CL 101 106  CO2 29 31  GLUCOSE 112* 109*  BUN 22 15  CREATININE 1.26* 1.03*  CALCIUM 10.1 9.3   Assessment & Plan: 64 year old female s/p left ureteral stent placement for management of left renal colic associated with a 4 mm ureteral stone.  Intraoperative findings consistent with distal migration versus interval passage of the stone.  On empiric ceftriaxone for pyuria with normalization of leukocytosis and downtrending creatinine today.  Patient recovering well.  Will obtain KUB today to see if stone is visualizable alongside stent (ordered).  I explained to the patient that definitive stone management with likely URS/LL/stent exchange will proceed pending urine culture results with antibiotic therapy as indicated and per KUB results.  She expressed understanding.  Recommendations: -KUB today -Continue empiric antibiotics and follow urine culture -Oxybutynin 5mg  every 8 hours as needed for stent discomfort -We will discuss plans for definitive stone management per KUB results -Okay to discharge from urologic perspective after imaging is obtained  Debroah Loop, PA-C 04/24/2020

## 2020-04-25 ENCOUNTER — Other Ambulatory Visit: Payer: Self-pay | Admitting: Radiology

## 2020-04-25 ENCOUNTER — Ambulatory Visit: Payer: 59 | Admitting: Allergy & Immunology

## 2020-04-25 DIAGNOSIS — N201 Calculus of ureter: Secondary | ICD-10-CM

## 2020-04-25 LAB — URINE CULTURE: Culture: <10000 — AB

## 2020-04-25 MED ORDER — OXYBUTYNIN CHLORIDE 5 MG PO TABS: 5.0000 mg | ORAL_TABLET | Freq: Three times a day (TID) | ORAL | 0 refills | Status: DC | PRN

## 2020-04-25 NOTE — Anesthesia Postprocedure Evaluation (Signed)
Anesthesia Post Note  Patient: Rachel Fry  Procedure(s) Performed: CYSTOSCOPY WITH STENT PLACEMENT (Left Ureter) CYSTOSCOPY WITH RETROGRADE PYELOGRAM (Left Ureter)  Patient location during evaluation: PACU Anesthesia Type: General Level of consciousness: awake and alert Pain management: pain level controlled Vital Signs Assessment: post-procedure vital signs reviewed and stable Respiratory status: spontaneous breathing, nonlabored ventilation, respiratory function stable and patient connected to nasal cannula oxygen Cardiovascular status: blood pressure returned to baseline and stable Postop Assessment: no apparent nausea or vomiting Anesthetic complications: no   No complications documented.   Last Vitals:  Vitals:   04/24/20 0918 04/24/20 1347  BP: (!) 150/80 (!) 148/80  Pulse: 80 88  Resp: 18   Temp: 36.8 C 36.8 C  SpO2: 94% 95%    Last Pain:  Vitals:   04/24/20 1347  TempSrc: Oral  PainSc:                  Molli Barrows

## 2020-04-26 ENCOUNTER — Telehealth: Payer: Self-pay

## 2020-04-26 ENCOUNTER — Encounter: Payer: Self-pay | Admitting: *Deleted

## 2020-04-26 ENCOUNTER — Other Ambulatory Visit: Payer: Self-pay | Admitting: *Deleted

## 2020-04-26 NOTE — Patient Outreach (Signed)
[facility]) Care Management  04/26/2020  Rachel Fry 1956/04/01 [id number]  Transition of care call/case closure   Referral received:04/26/20 Initial outreach:04/26/20 Insurance: [employer]    Subjective: Initial successful telephone call to patient's preferred number in order to complete transition of care assessment; 2 HIPAA identifiers verified. Explained purpose of call and completed transition of care assessment.  Rachel Fry states that she is doing fair, she continues to have some discomfort at left side, to back area getting some relief with tramadol on ditropan. She reports continuing to have blood tinged urine color, no worse since discharge, she continues to increase fluid intake, reinforced notifying MD of worsening symptoms signs of infection.  She discussed planned outpatient procedure on 7/16 stent exchange.  She denies having fever. He spouse   assisting with her recovery as needed.  She states that she is participating in chronic disease management program with Active health management , and has telephone visit on this afternoon. 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  She says she does not have the hospital indemnity She states that she  uses a [facility] outpatient pharmacy at [facility]  Objective:  Rachel Fry  was hospitalized at [facility] from 6/29-6/30/21 UTI, Nephrolithiasis,  left ureteral stent placed on 6/29. Comorbidities include: Hyperlipidemia, Asthma, hypertension , Migraines.  He was discharged to home on 04/24/20 without the need for home health services or DME.   Assessment:  Patient voices good understanding of all discharge instructions.  See transition of care flowsheet for assessment details.   Plan:  Reviewed hospital discharge diagnosis of UTI, Left ureteral stent placed ,   and discharge treatment plan using hospital discharge instructions, assessing medication adherence,  reviewing problems requiring provider notification, and discussing the importance of follow up with surgeon, primary care provider and/or specialists as directed.  Reviewed [employer] healthy lifestyle program information to receive discounted premium for  2022   Step 1: Get  your annual physical  Step 2: Complete your health assessment  Step 3:Identify your current health status and complete the corresponding action step between January 1, and June 26, 2020.   She has completed.   Using [employer] website, verified that patient is an active participate in [facility]'s [employer] chronic disease management program.    Patient appreciative of  follow up in the next 2 weeks following her next procedure, no identified care coordination needs at this time.   Will  route successful outreach letter with [place] [facility] Management pamphlet and 24 Hour Nurse Line Magnet to [place] Management clinical pool to be mailed to patient's home address.  Thanked patient for their services to [facility].  Rachel Draft, RN, BSN  [facility] Management Coordinator  [phone]- Mobile [phone]- Toll Free Main Office

## 2020-04-26 NOTE — Telephone Encounter (Signed)
Patient called stating that she is seeing blood in her urine. She know with having a stent in place that seeing some is normal post op but wanted to know if it will last the whole time the stent is in place. She was notified that yes blood can be present the entire time the stent is in place. She should increase water intake. She denies fever, chills, nausea or vomiting.

## 2020-05-01 ENCOUNTER — Other Ambulatory Visit: Payer: Self-pay | Admitting: *Deleted

## 2020-05-01 DIAGNOSIS — N201 Calculus of ureter: Secondary | ICD-10-CM

## 2020-05-03 ENCOUNTER — Other Ambulatory Visit: Payer: 59

## 2020-05-03 ENCOUNTER — Other Ambulatory Visit: Payer: Self-pay

## 2020-05-03 DIAGNOSIS — N201 Calculus of ureter: Secondary | ICD-10-CM | POA: Diagnosis not present

## 2020-05-03 DIAGNOSIS — R310 Gross hematuria: Secondary | ICD-10-CM | POA: Diagnosis not present

## 2020-05-03 LAB — URINALYSIS, COMPLETE
Bilirubin, UA: NEGATIVE
Glucose, UA: NEGATIVE
Ketones, UA: NEGATIVE
Nitrite, UA: NEGATIVE
Specific Gravity, UA: 1.015 (ref 1.005–1.030)
Urobilinogen, Ur: 0.2 mg/dL (ref 0.2–1.0)
pH, UA: 5 (ref 5.0–7.5)

## 2020-05-03 LAB — MICROSCOPIC EXAMINATION: RBC, Urine: >30 /hpf — AB (ref 0–2)

## 2020-05-06 ENCOUNTER — Other Ambulatory Visit: Payer: Self-pay

## 2020-05-06 ENCOUNTER — Other Ambulatory Visit
Admission: RE | Admit: 2020-05-06 | Discharge: 2020-05-06 | Disposition: A | Discharge status: Home / Self Care | Payer: 59 | Source: Ambulatory Visit | Attending: Urology | Admitting: Urology

## 2020-05-06 ENCOUNTER — Telehealth: Payer: Self-pay | Admitting: *Deleted

## 2020-05-06 HISTORY — DX: Personal history of urinary calculi: Z87.442

## 2020-05-06 HISTORY — DX: Hypothyroidism, unspecified: E03.9

## 2020-05-06 LAB — CULTURE, URINE COMPREHENSIVE

## 2020-05-06 MED ORDER — AMOXICILLIN 500 MG PO TABS: 500.0000 mg | ORAL_TABLET | Freq: Two times a day (BID) | ORAL | 0 refills | Status: DC

## 2020-05-06 NOTE — Patient Instructions (Signed)
Your procedure is scheduled on: Friday May 10, 2020. Report to Day Surgery. To find out your arrival time please call [phone] between 1PM - 3PM on Thursday May 09, 2020.  Remember: Instructions that are not followed completely may result in serious medical risk,  up to and including death, or upon the discretion of your surgeon and anesthesiologist your  surgery may need to be rescheduled.     _X__ 1. Do not eat food after midnight the night before your procedure.                 No gum chewing or hard candies. You may drink clear liquids up to 2 hours                 before you are scheduled to arrive for your surgery- DO not drink clear                 liquids within 2 hours of the start of your surgery.                 Clear Liquids include:  water, apple juice without pulp, clear Gatorade, G2 or                  Gatorade Zero (avoid Red/Purple/Blue), Black Coffee or Tea (Do not add                 anything to coffee or tea).  __X__2.  On the morning of surgery brush your teeth with toothpaste and water, you                may rinse your mouth with mouthwash if you wish.  Do not swallow any toothpaste of mouthwash.     _X__ 3.  No Alcohol for 24 hours before or after surgery.   _X__ 4.  Do Not Smoke or use e-cigarettes For 24 Hours Prior to Your Surgery.                 Do not use any chewable tobacco products for at least 6 hours prior to                 Surgery.  _X__  5.  Do not use any recreational drugs (marijuana, cocaine, heroin, ecstacy, MDMA or other)                For at least one week prior to your surgery.  Combination of these drugs with anesthesia                May have life threatening results.   __X__ 6.  Notify your doctor if there is any change in your medical condition      (cold, fever, infections).     Do not wear jewelry, make-up, hairpins, clips or nail polish. Do not wear lotions, powders, or perfumes. You may wear  deodorant. Do not shave 48 hours prior to surgery. Men may shave face and neck. Do not bring valuables to the hospital.    [facility] is not responsible for any belongings or valuables.  Contacts, dentures or bridgework may not be worn into surgery. Leave your suitcase in the car. After surgery it may be brought to your room. For patients admitted to the hospital, discharge time is determined by your treatment team.   Patients discharged the day of surgery will not be allowed to drive home.   Make arrangements for someone to be with you for the  first 24 hours of your Same Day Discharge.   __X__ Take these medicines the morning of surgery with A SIP OF WATER:    1. levothyroxine (SYNTHROID, LEVOTHROID) 50 MCG  2. oxybutynin (DITROPAN) 5 MG  3. traMADol (ULTRAM) 50 MG tablet  4. zafirlukast (ACCOLATE) 10 MG  5. lansoprazole (PREVACID) 30 MG   __X__ Stop Anti-inflammatories such as ibuprofen, Aleve, Advil, naproxen, aspirin and or BC powders.    __X__ Stop supplements until after surgery.    __X__ Do not start any herbal supplements before your surgery.

## 2020-05-06 NOTE — Telephone Encounter (Signed)
Patient called regarding urine culture results-final report not resulted yet. Called [facility]-final result will be back by tomorrow. Per Dr. Diamantina Providence start Amoxicillin 500 mg BID 3 days will call if culture shows anything different. Patient voiced understanding.

## 2020-05-07 NOTE — Telephone Encounter (Signed)
Per Dr Diamantina Providence, advised patient to continue amoxicillin as it is appropriate for the infection. Patient verbalized understanding.

## 2020-05-08 ENCOUNTER — Other Ambulatory Visit: Payer: Self-pay

## 2020-05-08 ENCOUNTER — Other Ambulatory Visit
Admission: RE | Admit: 2020-05-08 | Discharge: 2020-05-08 | Disposition: A | Discharge status: Home / Self Care | Payer: 59 | Source: Ambulatory Visit | Attending: Urology | Admitting: Urology

## 2020-05-08 DIAGNOSIS — Z01812 Encounter for preprocedural laboratory examination: Secondary | ICD-10-CM | POA: Insufficient documentation

## 2020-05-08 DIAGNOSIS — Z20822 Contact with and (suspected) exposure to covid-19: Secondary | ICD-10-CM | POA: Insufficient documentation

## 2020-05-08 LAB — SARS CORONAVIRUS 2 (TAT 6-24 HRS): SARS Coronavirus 2: NEGATIVE

## 2020-05-09 MED FILL — VIT D2 1.25 MG (50,000 UNIT: 1.25 MG | 84 days supply | Qty: 12 | Fill #3

## 2020-05-09 MED FILL — AIMOVIG 140 MG/ML SOAJ: 140 | 30 days supply | Qty: 1 | Fill #4

## 2020-05-10 ENCOUNTER — Other Ambulatory Visit: Payer: Self-pay | Admitting: Radiology

## 2020-05-10 DIAGNOSIS — N201 Calculus of ureter: Secondary | ICD-10-CM

## 2020-05-10 DIAGNOSIS — N1330 Unspecified hydronephrosis: Secondary | ICD-10-CM

## 2020-05-10 MED ORDER — AMOXICILLIN 500 MG PO TABS: 500.0000 mg | ORAL_TABLET | Freq: Two times a day (BID) | ORAL | 0 refills | Status: DC

## 2020-05-12 DIAGNOSIS — N132 Hydronephrosis with renal and ureteral calculous obstruction: Secondary | ICD-10-CM | POA: Diagnosis not present

## 2020-05-13 ENCOUNTER — Ambulatory Visit: Payer: 59 | Admitting: Anesthesiology

## 2020-05-13 ENCOUNTER — Ambulatory Visit: Payer: Self-pay | Admitting: *Deleted

## 2020-05-13 ENCOUNTER — Other Ambulatory Visit: Payer: Self-pay

## 2020-05-13 ENCOUNTER — Encounter: Payer: Self-pay | Admitting: Urology

## 2020-05-13 ENCOUNTER — Ambulatory Visit
Admission: RE | Admit: 2020-05-13 | Discharge: 2020-05-13 | Disposition: A | Discharge status: Home / Self Care | Payer: 59 | Attending: Urology | Admitting: Urology

## 2020-05-13 ENCOUNTER — Ambulatory Visit: Payer: 59

## 2020-05-13 ENCOUNTER — Encounter
Admission: RE | Disposition: A | Discharge status: Home / Self Care | Payer: Self-pay | Source: Home / Self Care | Attending: Urology

## 2020-05-13 DIAGNOSIS — E039 Hypothyroidism, unspecified: Secondary | ICD-10-CM | POA: Insufficient documentation

## 2020-05-13 DIAGNOSIS — Z79899 Other long term (current) drug therapy: Secondary | ICD-10-CM | POA: Insufficient documentation

## 2020-05-13 DIAGNOSIS — J45909 Unspecified asthma, uncomplicated: Secondary | ICD-10-CM | POA: Diagnosis not present

## 2020-05-13 DIAGNOSIS — N1330 Unspecified hydronephrosis: Secondary | ICD-10-CM

## 2020-05-13 DIAGNOSIS — Z87442 Personal history of urinary calculi: Secondary | ICD-10-CM | POA: Diagnosis not present

## 2020-05-13 DIAGNOSIS — Z79890 Hormone replacement therapy (postmenopausal): Secondary | ICD-10-CM | POA: Insufficient documentation

## 2020-05-13 DIAGNOSIS — Z466 Encounter for fitting and adjustment of urinary device: Secondary | ICD-10-CM | POA: Diagnosis not present

## 2020-05-13 DIAGNOSIS — N201 Calculus of ureter: Secondary | ICD-10-CM | POA: Diagnosis not present

## 2020-05-13 DIAGNOSIS — G43909 Migraine, unspecified, not intractable, without status migrainosus: Secondary | ICD-10-CM | POA: Diagnosis not present

## 2020-05-13 DIAGNOSIS — Z7951 Long term (current) use of inhaled steroids: Secondary | ICD-10-CM | POA: Diagnosis not present

## 2020-05-13 HISTORY — PX: STONE EXTRACTION WITH BASKET: SHX5318

## 2020-05-13 HISTORY — PX: CYSTOSCOPY W/ RETROGRADES: SHX1426

## 2020-05-13 HISTORY — PX: CYSTOSCOPY/URETEROSCOPY/HOLMIUM LASER/STENT PLACEMENT: SHX6546

## 2020-05-13 SURGERY — CYSTOSCOPY/URETEROSCOPY/HOLMIUM LASER/STENT PLACEMENT
Anesthesia: General | Laterality: Left

## 2020-05-13 MED ORDER — PROPOFOL 10 MG/ML IV BOLUS
INTRAVENOUS | Status: DC | PRN
  Administered 2020-05-13: 150 mg via INTRAVENOUS

## 2020-05-13 MED ORDER — FENTANYL CITRATE (PF) 100 MCG/2ML IJ SOLN
INTRAMUSCULAR | Status: DC | PRN
  Administered 2020-05-13: 50 ug via INTRAVENOUS
  Administered 2020-05-13: 25 ug via INTRAVENOUS

## 2020-05-13 MED ORDER — CHLORHEXIDINE GLUCONATE 0.12 % MT SOLN: 15.0000 mL | Freq: Once | OROMUCOSAL | Status: AC

## 2020-05-13 MED ORDER — CHLORHEXIDINE GLUCONATE 0.12 % MT SOLN
OROMUCOSAL | Status: AC
  Administered 2020-05-13: 15 mL via OROMUCOSAL
  Filled 2020-05-13: qty 15

## 2020-05-13 MED ORDER — ORAL CARE MOUTH RINSE: 15.0000 mL | Freq: Once | OROMUCOSAL | Status: AC

## 2020-05-13 MED ORDER — LACTATED RINGERS IV SOLN: INTRAVENOUS | Status: DC

## 2020-05-13 MED ORDER — ACETAMINOPHEN 10 MG/ML IV SOLN
INTRAVENOUS | Status: AC
  Filled 2020-05-13: qty 100

## 2020-05-13 MED ORDER — ACETAMINOPHEN 10 MG/ML IV SOLN
INTRAVENOUS | Status: DC | PRN
  Administered 2020-05-13: 1000 mg via INTRAVENOUS

## 2020-05-13 MED ORDER — PROPOFOL 10 MG/ML IV BOLUS
INTRAVENOUS | Status: AC
  Filled 2020-05-13: qty 20

## 2020-05-13 MED ORDER — ONDANSETRON HCL 4 MG/2ML IJ SOLN
INTRAMUSCULAR | Status: AC
  Filled 2020-05-13: qty 2

## 2020-05-13 MED ORDER — MIDAZOLAM HCL 2 MG/2ML IJ SOLN
INTRAMUSCULAR | Status: AC
  Filled 2020-05-13: qty 2

## 2020-05-13 MED ORDER — DEXAMETHASONE SODIUM PHOSPHATE 10 MG/ML IJ SOLN
INTRAMUSCULAR | Status: AC
  Filled 2020-05-13: qty 1

## 2020-05-13 MED ORDER — CEFAZOLIN SODIUM-DEXTROSE 1-4 GM/50ML-% IV SOLN
INTRAVENOUS | Status: AC
  Filled 2020-05-13: qty 50

## 2020-05-13 MED ORDER — SUGAMMADEX SODIUM 500 MG/5ML IV SOLN
INTRAVENOUS | Status: DC | PRN
  Administered 2020-05-13: 308 mg via INTRAVENOUS

## 2020-05-13 MED ORDER — LIDOCAINE HCL (PF) 2 % IJ SOLN
INTRAMUSCULAR | Status: AC
  Filled 2020-05-13: qty 5

## 2020-05-13 MED ORDER — CEFAZOLIN SODIUM-DEXTROSE 1-4 GM/50ML-% IV SOLN
1.0000 g | INTRAVENOUS | Status: AC
  Administered 2020-05-13: 1 g via INTRAVENOUS

## 2020-05-13 MED ORDER — SUCCINYLCHOLINE CHLORIDE 200 MG/10ML IV SOSY
PREFILLED_SYRINGE | INTRAVENOUS | Status: AC
  Filled 2020-05-13: qty 10

## 2020-05-13 MED ORDER — PHENYLEPHRINE HCL (PRESSORS) 10 MG/ML IV SOLN
INTRAVENOUS | Status: AC
  Filled 2020-05-13: qty 1

## 2020-05-13 MED ORDER — TRAMADOL HCL 50 MG PO TABS: 50.0000 mg | ORAL_TABLET | Freq: Four times a day (QID) | ORAL | 0 refills | Status: DC | PRN

## 2020-05-13 MED ORDER — FENTANYL CITRATE (PF) 100 MCG/2ML IJ SOLN
INTRAMUSCULAR | Status: AC
  Filled 2020-05-13: qty 2

## 2020-05-13 MED ORDER — ROCURONIUM BROMIDE 100 MG/10ML IV SOLN
INTRAVENOUS | Status: DC | PRN
  Administered 2020-05-13: 50 mg via INTRAVENOUS

## 2020-05-13 MED ORDER — OXYCODONE-ACETAMINOPHEN 5-325 MG PO TABS: 1.0000 | ORAL_TABLET | ORAL | 0 refills | Status: DC | PRN

## 2020-05-13 MED ORDER — MIDAZOLAM HCL 2 MG/2ML IJ SOLN
INTRAMUSCULAR | Status: DC | PRN
  Administered 2020-05-13: 2 mg via INTRAVENOUS

## 2020-05-13 MED ORDER — IOPAMIDOL (ISOVUE-200) INJECTION 41%
INTRAVENOUS | Status: DC | PRN
  Administered 2020-05-13: 15 mL

## 2020-05-13 MED ORDER — LIDOCAINE HCL (CARDIAC) PF 100 MG/5ML IV SOSY
PREFILLED_SYRINGE | INTRAVENOUS | Status: DC | PRN
  Administered 2020-05-13: 100 mg via INTRAVENOUS

## 2020-05-13 MED ORDER — SUGAMMADEX SODIUM 500 MG/5ML IV SOLN
INTRAVENOUS | Status: AC
  Filled 2020-05-13: qty 5

## 2020-05-13 SURGICAL SUPPLY — 28 items
ADHESIVE MASTISOL STRL (MISCELLANEOUS) ×3 IMPLANT
BAG DRAIN CYSTO-URO LG1000N (MISCELLANEOUS) ×3 IMPLANT
BASKET ZERO TIP 1.9FR (BASKET) IMPLANT
BRUSH SCRUB EZ 1% IODOPHOR (MISCELLANEOUS) ×3 IMPLANT
CATH URETL 5X70 OPEN END (CATHETERS) ×3 IMPLANT
CNTNR SPEC 2.5X3XGRAD LEK (MISCELLANEOUS)
CONT SPEC 4OZ STER OR WHT (MISCELLANEOUS)
CONTAINER SPEC 2.5X3XGRAD LEK (MISCELLANEOUS) IMPLANT
DRAPE UTILITY 15X26 TOWEL STRL (DRAPES) ×3 IMPLANT
DRSG TEGADERM 2-3/8X2-3/4 SM (GAUZE/BANDAGES/DRESSINGS) ×3 IMPLANT
FIBER LASER TRACTIP 200 (UROLOGICAL SUPPLIES) IMPLANT
GLOVE BIO SURGEON STRL SZ 6.5 (GLOVE) ×3 IMPLANT
GOWN STRL REUS W/ TWL LRG LVL3 (GOWN DISPOSABLE) ×4 IMPLANT
GOWN STRL REUS W/TWL LRG LVL3 (GOWN DISPOSABLE) ×2
GUIDEWIRE GREEN .038 145CM (MISCELLANEOUS) ×3 IMPLANT
GUIDEWIRE STR DUAL SENSOR (WIRE) ×3 IMPLANT
INFUSOR MANOMETER BAG 3000ML (MISCELLANEOUS) ×3 IMPLANT
INTRODUCER DILATOR DOUBLE (INTRODUCER) IMPLANT
KIT TURNOVER CYSTO (KITS) ×3 IMPLANT
PACK CYSTO AR (MISCELLANEOUS) ×3 IMPLANT
SET CYSTO W/LG BORE CLAMP LF (SET/KITS/TRAYS/PACK) ×3 IMPLANT
SHEATH URETERAL 12FRX35CM (MISCELLANEOUS) IMPLANT
SOL .9 NS 3000ML IRR  AL (IV SOLUTION) ×1
SOL .9 NS 3000ML IRR UROMATIC (IV SOLUTION) ×2 IMPLANT
STENT URET 6FRX24 CONTOUR (STENTS) ×3 IMPLANT
STENT URET 6FRX26 CONTOUR (STENTS) IMPLANT
SURGILUBE 2OZ TUBE FLIPTOP (MISCELLANEOUS) ×3 IMPLANT
WATER STERILE IRR 1000ML POUR (IV SOLUTION) ×3 IMPLANT

## 2020-05-13 NOTE — Transfer of Care (Signed)
Immediate Anesthesia Transfer of Care Note  Patient: Reighan Hipolito Pascarella  Procedure(s) Performed: Procedure(s): CYSTOSCOPY/URETEROSCOPY/LASER/STENT EXCHANGE (Left) CYSTOSCOPY WITH RETROGRADE PYELOGRAM (Left) STONE EXTRACTION WITH BASKET  Patient Location: PACU  Anesthesia Type:General  Level of Consciousness: sedated  Airway & Oxygen Therapy: Patient Spontanous Breathing and Patient connected to face mask oxygen  Post-op Assessment: Report given to RN and Post -op Vital signs reviewed and stable  Post vital signs: Reviewed and stable  Last Vitals:  Vitals:   05/13/20 1046 05/13/20 1249  BP: (!) 158/76 (!) 148/77  Pulse: (!) 113 96  Resp: 20 20  Temp: 36.5 C (!) 36.4 C  SpO2: 12% 458%    Complications: No apparent anesthesia complications

## 2020-05-13 NOTE — OR Nursing (Addendum)
Pt advises she can't take percocet "I never got it filled before because the pharmacist said I couldn't take it because I'm allergic to codeine."  (advises "I think it's a rash")  Pt. Requests tramadol.  Discussed with Dr. Erlene Quan re same; states she will change rx to tramadol.  Patient notifed of same.

## 2020-05-13 NOTE — Anesthesia Procedure Notes (Signed)
Procedure Name: Intubation Date/Time: 05/13/2020 12:03 PM Performed by: Doreen Salvage, CRNA Pre-anesthesia Checklist: Patient identified, Patient being monitored, Timeout performed, Emergency Drugs available and Suction available Patient Re-evaluated:Patient Re-evaluated prior to induction Oxygen Delivery Method: Circle system utilized Preoxygenation: Pre-oxygenation with 100% oxygen Induction Type: IV induction Ventilation: Mask ventilation without difficulty Laryngoscope Size: Mac, 3 and McGraph Grade View: Grade II Tube type: Oral Tube size: 7.0 mm Number of attempts: 1 Airway Equipment and Method: Stylet Placement Confirmation: ETT inserted through vocal cords under direct vision,  positive ETCO2 and breath sounds checked- equal and bilateral Secured at: 21 cm Tube secured with: Tape Dental Injury: Teeth and Oropharynx as per pre-operative assessment  Difficulty Due To: Difficult Airway- due to anterior larynx

## 2020-05-13 NOTE — Discharge Instructions (Addendum)
You have a ureteral stent in place.  This is a tube that extends from your kidney to your bladder.  This may cause urinary bleeding, burning with urination, and urinary frequency.  Please call our office or present to the ED if you develop fevers >101 or pain which is not able to be controlled with oral pain medications.  You may be given either Flomax and/ or ditropan to help with bladder spasms and stent pain in addition to pain medications.    Your stent is on a string.  On Thursday morning, you may untaped and remove this.  If you have any issues or would like [place] to do in the office, please contact us so we can schedule you.  [facility] [address] [place], [place] [address] [phone]   AMBULATORY SURGERY  DISCHARGE INSTRUCTIONS   1) The drugs that you were given will stay in your system until tomorrow so for the next 24 hours you should not:  A) Drive an automobile B) Make any legal decisions C) Drink any alcoholic beverage   2) You may resume regular meals tomorrow.  Today it is better to start with liquids and gradually work up to solid foods.  You may eat anything you prefer, but it is better to start with liquids, then soup and crackers, and gradually work up to solid foods.   3) Please notify your doctor immediately if you have any unusual bleeding, trouble breathing, redness and pain at the surgery site, drainage, fever, or pain not relieved by medication.    4) Additional Instructions:        Please contact your physician with any problems or Same Day Surgery at [phone], Monday through Friday 6 am to 4 pm, or [place] at [facility] number at [phone].

## 2020-05-13 NOTE — Op Note (Signed)
Date of procedure: 05/13/20  Preoperative diagnosis:  1. Left ureteral calculus 2. Left urinary obstruction 3. History of left pyelonephritis/UTI  Postoperative diagnosis:  1. Same as above  Procedure: 1. Left ureteroscopy 2. Left retrograde pyelogram 3. Left ureteral stent exchange 4. Basket extraction of left ureteral stone  Surgeon: Hollice Espy, MD  Anesthesia: General  Complications: None  Intraoperative findings: 4 mm stone partially embedded at the left distal ureter.  Mild diffuse ureteral wall edema.  Stone extracted via [employer].  Stent exchanged on tether.  EBL: Minimal  Specimens: Stone  Drains: 6 x 24 French double-J ureteral stent on left (tether)  Indication: Rachel Fry is a 64 y.o. patient with an obstructing 4 mm left proximal ureteral stone who initially presented with pain and then concern for pyelonephritis/UTI.  She underwent ureteral stent placement and returns today for definitive stone management.  She has been treated for her infection adequately.  After reviewing the management options for treatment, she elected to proceed with the above surgical procedure(s). We have discussed the potential benefits and risks of the procedure, side effects of the proposed treatment, the likelihood of the patient achieving the goals of the procedure, and any potential problems that might occur during the procedure or recuperation. Informed consent has been obtained.  Description of procedure:  The patient was taken to the operating room and general anesthesia was induced.  The patient was placed in the dorsal lithotomy position, prepped and draped in the usual sterile fashion, and preoperative antibiotics were administered. A preoperative time-out was performed.   21 [place] scope was advanced per urethra into the bladder.  Attention was turned to the left ureteral orifice which a ureteral stent was seen emanating.  Of note, there was a good amount of edema around the  left UO.  Stent graspers were used and then brought the stent out to level the urethral meatus.  Was then cannulated up to the level of the kidney.  The stent was removed and the wire was snapped in place.  Use a semirigid 4.5 ureteroscope which I was able to advance to the mid ureter and the stone was not immediately identified.  A Super Stiff wire was then placed through the scope up to the level of the kidney.  I then advanced an 8 [place] single channel flexible ureteroscope up to the level of the kidney.  Each every calyx was then directly visualized.  No significant stone burden was found in the upper tract of the kidney.  I injected contrast through the scope to outline the collecting system to ensure that each and every calyx have been directly visualized.  There was no extravasation.  I slowly backed the scope down the length of the laser.  In the very distal ureter, the stone was seen partially embedded on the lateral wall of the ureter and not seen on the initial semirigid ureteroscopy as this was also in a location where there was a good amount of edema.  He is the beak of the scope to free it from the wall.  I then exchanged to the semirigid ureteroscope and used a 1.9 French tipless nitinol basket to extract the basket and 1 piece in a lengthwise orientation which came out easily.  This was passed off the field.  I reinserted the scope and readvanced it up to level the mid ureter.  There were no additional stones or stone fragments appreciated.  The integrity of the ureter appeared to be good.  Finally,  a 6 x 24 French double-J ureteral stent was advanced over the wire up to level the kidney.  The wires partially drawn till full coils noted within the renal pelvis as well as within the bladder.  The bladder was drained.  The stent string was affixed to the patient's left inner thigh using muscle and Tegaderm.  She was cleaned and dried, repositioned supine position, wrist myesthesia, and taken to the  PACU in stable condition.  Plan she will remove her own stent on Thursday.  She will return in 4 weeks with a renal ultrasound prior.    Hollice Espy, M.D.

## 2020-05-13 NOTE — Interval H&P Note (Signed)
History and Physical Interval Note:  05/13/2020 11:42 AM  Rachel Fry  has presented today for surgery, with the diagnosis of left ureteral stone, left hydroureteronephrosis.  The various methods of treatment have been discussed with the patient and family. After consideration of risks, benefits and other options for treatment, the patient has consented to  Procedure(s): CYSTOSCOPY/URETEROSCOPY/HOLMIUM LASER/STENT EXCHANGE (Left) as a surgical intervention.  The patient's history has been reviewed, patient examined, no change in status, stable for surgery.  I have reviewed the patient's chart and labs.  Questions were answered to the patient's satisfaction.    RRR CTAB  S/p stent Infection treated  Hollice Espy

## 2020-05-13 NOTE — Anesthesia Preprocedure Evaluation (Addendum)
Anesthesia Evaluation  Patient identified by MRN, date of birth, ID band Patient awake    Reviewed: Allergy & Precautions, H&P , NPO status , Patient's Chart, lab work & pertinent test results  History of Anesthesia Complications (+) PONVHistory of anesthetic complications: PONV in past, did fine with most recent cysto.  Airway Mallampati: II  TM Distance: >3 FB Neck ROM: full    Dental  (+) Teeth Intact   Pulmonary shortness of breath, asthma ,    breath sounds clear to auscultation       Cardiovascular (-) angina(-) Past MI and (-) Cardiac Stents + dysrhythmias (bradycardia)  Rhythm:regular Rate:Normal     Neuro/Psych  Headaches, PSYCHIATRIC DISORDERS Anxiety Depression    GI/Hepatic GERD  Controlled,(+) Hepatitis -  Endo/Other  Hypothyroidism   Renal/GU      Musculoskeletal   Abdominal   Peds  Hematology negative hematology ROS (+)   Anesthesia Other Findings Past Medical History: No date: Allergy No date: Asthma No date: History of kidney stones No date: Hypothyroidism No date: Migraines No date: Numbness and tingling     Comment:  left side of body, occasionally right side  No date: S/P Botox injection  Past Surgical History: 1994 and 1996: ARM NEUROPLASTY; Right     Comment:  for chronic pain No date: CESAREAN SECTION 04/23/2020: CYSTOSCOPY W/ RETROGRADES; Left     Comment:  Procedure: CYSTOSCOPY WITH RETROGRADE PYELOGRAM;                Surgeon: Abbie Sons, MD;  Location: [facility] ORS;                Service: Urology;  Laterality: Left; 04/23/2020: CYSTOSCOPY WITH STENT PLACEMENT; Left     Comment:  Procedure: CYSTOSCOPY WITH STENT PLACEMENT;  Surgeon:               Abbie Sons, MD;  Location: [facility] ORS;  Service:               Urology;  Laterality: Left; 1973: OVARIAN CYST REMOVAL No date: TONSILLECTOMY AND ADENOIDECTOMY 1988: TUBAL LIGATION     Reproductive/Obstetrics negative OB  ROS                            Anesthesia Physical Anesthesia Plan  ASA: II  Anesthesia Plan: General ETT   Post-op Pain Management:    Induction:   PONV Risk Score and Plan: Ondansetron, Dexamethasone, Midazolam and Treatment may vary due to age or medical condition  Airway Management Planned:   Additional Equipment:   Intra-op Plan:   Post-operative Plan:   Informed Consent: I have reviewed the patients History and Physical, chart, labs and discussed the procedure including the risks, benefits and alternatives for the proposed anesthesia with the patient or authorized representative who has indicated his/her understanding and acceptance.     Dental Advisory Given  Plan Discussed with: Anesthesiologist, CRNA and Surgeon  Anesthesia Plan Comments:         Anesthesia Quick Evaluation

## 2020-05-14 ENCOUNTER — Encounter: Payer: Self-pay | Admitting: Urology

## 2020-05-14 NOTE — Anesthesia Postprocedure Evaluation (Signed)
Anesthesia Post Note  Patient: Rachel Fry  Procedure(s) Performed: CYSTOSCOPY/URETEROSCOPY/LASER/STENT EXCHANGE (Left ) CYSTOSCOPY WITH RETROGRADE PYELOGRAM (Left ) STONE EXTRACTION WITH BASKET  Patient location during evaluation: PACU Anesthesia Type: General Level of consciousness: awake and alert Pain management: pain level controlled Vital Signs Assessment: post-procedure vital signs reviewed and stable Respiratory status: spontaneous breathing, nonlabored ventilation and respiratory function stable Cardiovascular status: blood pressure returned to baseline and stable Postop Assessment: no apparent nausea or vomiting Anesthetic complications: no   No complications documented.   Last Vitals:  Vitals:   05/13/20 1334 05/13/20 1353  BP: (!) 153/81 (!) 160/85  Pulse: 93 96  Resp: 16 16  Temp: (!) 36.2 C   SpO2: 98% 98%    Last Pain:  Vitals:   05/13/20 1353  TempSrc:   PainSc: 0-No pain                 Brett Canales Nyazia Canevari

## 2020-05-15 ENCOUNTER — Other Ambulatory Visit: Payer: Self-pay | Admitting: *Deleted

## 2020-05-15 NOTE — Patient Outreach (Signed)
[facility]) Care Management  05/15/2020  DORISSA STINNETTE 11-01-1955 [id number]   Transition of care follow up call/Case Closure   Referral received:04/26/20 Initial outreach:04/26/20 Insurance: [place]     Subjective: Successful follow up call to patient after recent , procedure for left ureteral stone, cystoscopy/ureterscopy laser stent exchange. Patient reports that she is doing good, having some discomfort but improvement since stone removal. She reports urinating without difficult and urine clear. She reports plans for stent removal on tomorrow.   Objective: Mrs.Rochin was hospitalized [facility] from 6/29-6/30/21 UTI, Nephrolithiasis,  left ureteral stent placed on 6/29. Comorbidities include: Hyperlipidemia, Asthma, hypertension , Migraines.  He was discharged to home on 04/24/20 without the need for home health servicesor DME.   Plan No ongoing care management needs identified will plan case closure.    Joylene Draft, RN, BSN  [place] Management Coordinator  [phone]- Mobile [phone]- Toll Free Main Office

## 2020-05-15 NOTE — Progress Notes (Signed)
04/19/20 11:51 AM   Rachel Fry [birth year] [id number]  Referring provider: [name], [place],  [place] [address] No chief complaint on file.   HPI: Rachel Fry is a 64 y.o. F w/ hx of kidney stones presents today for stent removal.  Patient underwent a left URS/LL/ureteral stent placement for a 4 mm distal stone with Dr. Erlene Quan on 05/13/2020.  The post procedural course has been as expected and uneventful.    She removed her last stent and experienced pain, so she would like [place] to remove the stent for her today.       She is also having lower abdominal pain radiating up into the flank, nausea and gross hematuria.  She realizes that this is likely due to the urinary stent, but she did leave a urine specimen.  UA is nitrite positive, greater than 30 WBCs, greater than 30 RBCs, calcium oxalate and moderate bacteria.   PMH: Past Medical History:  Diagnosis Date  . Allergy   . Asthma   . History of kidney stones   . Hypothyroidism   . Migraines   . Numbness and tingling    left side of body, occasionally right side   . S/P Botox injection     Surgical History: Past Surgical History:  Procedure Laterality Date  . ARM NEUROPLASTY Right 1994 and 1996   for chronic pain  . CESAREAN SECTION    . CYSTOSCOPY W/ RETROGRADES Left 04/23/2020   Procedure: CYSTOSCOPY WITH RETROGRADE PYELOGRAM;  Surgeon: Abbie Sons, MD;  Location: [facility];  Service: Urology;  Laterality: Left;  . CYSTOSCOPY W/ RETROGRADES Left 05/13/2020   Procedure: CYSTOSCOPY WITH RETROGRADE PYELOGRAM;  Surgeon: Hollice Espy, MD;  Location: [facility];  Service: Urology;  Laterality: Left;  . CYSTOSCOPY WITH STENT PLACEMENT Left 04/23/2020   Procedure: CYSTOSCOPY WITH STENT PLACEMENT;  Surgeon: Abbie Sons, MD;  Location: [facility];  Service: Urology;  Laterality: Left;  . CYSTOSCOPY/URETEROSCOPY/HOLMIUM LASER/STENT PLACEMENT Left 05/13/2020   Procedure:  CYSTOSCOPY/URETEROSCOPY/LASER/STENT EXCHANGE;  Surgeon: Hollice Espy, MD;  Location: [facility];  Service: Urology;  Laterality: Left;  . OVARIAN CYST REMOVAL  1973  . STONE EXTRACTION WITH BASKET  05/13/2020   Procedure: STONE EXTRACTION WITH BASKET;  Surgeon: Hollice Espy, MD;  Location: [facility];  Service: Urology;;  . TONSILLECTOMY AND ADENOIDECTOMY    . TUBAL LIGATION  1988    Home Medications:  Allergies as of 05/16/2020      Reactions   Flonase [fluticasone Propionate] Other (See Comments)   The scent in flonase causes wheezing    Latex Itching   Fish Oil Itching   Percocet [oxycodone-acetaminophen] Other (See Comments)   Reports her pharmacist told her never to take this medication because of her codeine allergy   Sulfa Antibiotics    abdominal pain   Codeine Nausea And Vomiting, Rash   Tolerates tramadol       Medication List       Accurate as of May 16, 2020 11:51 AM. If you have any questions, ask your nurse or doctor.        STOP taking these medications   amoxicillin 500 MG tablet Commonly known as: AMOXIL Stopped by: Leila Schuff, PA-C   predniSONE 5 MG tablet Commonly known as: DELTASONE Stopped by: Daymon Hora, PA-C     TAKE these medications   Aimovig 70 MG/ML Soaj Generic drug: Erenumab-aooe Inject 70 mg into the skin every 30 (thirty) days.  atorvastatin 20 MG tablet Commonly known as: LIPITOR TAKE 1 TABLET (20 MG TOTAL) BY MOUTH DAILY. What changed: See the new instructions.   botulinum toxin Type A 100 units Solr injection Commonly known as: BOTOX Botox - Historical Medication  once every 12 weeks for migraines Active   Catapres 0.1 MG tablet Generic drug: cloNIDine Take 1 tablet (0.1 mg total) by mouth 2 (two) times daily.   cetirizine 10 MG tablet Commonly known as: ZYRTEC Take 10 mg by mouth every evening.   EPINEPHrine 0.3 mg/0.3 mL Soaj injection Commonly known as: EPI-PEN Inject 0.3 mg into the muscle as needed for  anaphylaxis.   ezetimibe 10 MG tablet Commonly known as: ZETIA Take 10 mg by mouth every evening.   fluticasone 110 MCG/ACT inhaler Commonly known as: FLOVENT HFA Inhale 2 puffs into the lungs 2 (two) times daily.   frovatriptan 2.5 MG tablet Commonly known as: FROVA Take 2.5 mg by mouth 2 (two) times daily as needed for migraine. Max of 3 doses per 7 days   furosemide 20 MG tablet Commonly known as: LASIX Take 40 mg by mouth daily as needed for edema.   lansoprazole 30 MG capsule Commonly known as: PREVACID Take 30 mg by mouth every evening.   levothyroxine 50 MCG tablet Commonly known as: SYNTHROID Take 50 mcg by mouth daily before breakfast.   Nasacort Allergy 24HR 55 MCG/ACT Aero nasal inhaler Generic drug: triamcinolone Place 2 sprays into the nose at bedtime.   Nurtec 75 MG Tbdp Generic drug: Rimegepant Sulfate Take 75 mg by mouth daily as needed (migraines). Max 15 doses per 30 days   oxybutynin 5 MG tablet Commonly known as: DITROPAN Take 1 tablet (5 mg total) by mouth 3 (three) times daily as needed for bladder spasms.   oxyCODONE-acetaminophen 5-325 MG tablet Commonly known as: PERCOCET/ROXICET SMARTSIG:1 By Mouth 4-5 Times Daily   rizatriptan 10 MG tablet Commonly known as: MAXALT Take 10 mg by mouth 2 (two) times daily as needed for migraine. Max doses of 3 doses per 7 days   spironolactone 25 MG tablet Commonly known as: ALDACTONE Take 50 mg by mouth daily.   SYSTANE OP Place 1-2 drops into both eyes 2 (two) times daily.   tamsulosin 0.4 MG Caps capsule Commonly known as: Flomax Take 1 capsule (0.4 mg total) by mouth daily. What changed: when to take this   traMADol 50 MG tablet Commonly known as: ULTRAM Take 1 tablet (50 mg total) by mouth every 6 (six) hours as needed for moderate pain.   Ventolin HFA 108 (90 Base) MCG/ACT inhaler Generic drug: albuterol Inhale 2 puffs into the lungs every 4 (four) hours as needed for wheezing or  shortness of breath.   Vitamin D (Ergocalciferol) 1.25 MG (50000 UNIT) Caps capsule Commonly known as: DRISDOL Take 50,000 Units by mouth every Sunday.   zafirlukast 10 MG tablet Commonly known as: ACCOLATE Take 10 mg by mouth 2 (two) times daily.       Allergies:  Allergies  Allergen Reactions  . Flonase [Fluticasone Propionate] Other (See Comments)    The scent in flonase causes wheezing   . Latex Itching  . Fish Oil Itching  . Percocet [Oxycodone-Acetaminophen] Other (See Comments)    Reports her pharmacist told her never to take this medication because of her codeine allergy  . Sulfa Antibiotics     abdominal pain  . Codeine Nausea And Vomiting and Rash    Tolerates tramadol     Family  History: Family History  Problem Relation Age of Onset  . Hypertension Mother   . Breast cancer Mother 88  . Thyroid disease Mother   . Stroke Mother   . Dementia Mother   . Cardiomyopathy Father   . Peripheral Artery Disease Father   . Hyperlipidemia Brother   . Hypertension Brother   . Hypertension Brother   . Hyperlipidemia Brother   . Thyroid disease Brother   . Hypertension Brother   . Birth defects Brother   . Congenital heart disease Brother   . Cancer Brother        Kidney cancer  . Colon cancer Maternal Grandmother   . Stroke Paternal Grandfather   . Neuropathy Neg Hx   . Multiple sclerosis Neg Hx   . Migraines Neg Hx     Social History:  reports that she has never smoked. She has never used smokeless tobacco. She reports that she does not drink alcohol and does not use drugs.   Physical Exam: BP (!) 171/117   Pulse 94   Ht 5\' 5"  (1.651 m)   Wt 170 lb (77.1 kg)   BMI 28.29 kg/m    T 98.2 Constitutional:  Well nourished. Alert and oriented, No acute distress. HEENT: [place] AT, mask in place.   Cardiovascular: No cyanosis Respiratory: Normal respiratory effort, no increased work of breathing. Neurologic: Grossly intact, no focal deficits, moving all 4  extremities. Psychiatric: Normal mood and affect.   Laboratory Data:  Lab Results  Component Value Date   CREATININE 1.03 (H) 04/24/2020   Urinalysis Component     Latest Ref Rng & Units 05/16/2020  Specific Gravity, UA     1.005 - 1.030 >1.030 (H)  pH, UA     5.0 - 7.5 5.0  Color, UA     Yellow Yellow  Appearance Ur     Clear Cloudy (A)  Leukocytes,UA     Negative 1+ (A)  Protein,UA     Negative/Trace 3+ (A)  Glucose, UA     Negative Trace (A)  Ketones, UA     Negative Negative  RBC, UA     Negative 3+ (A)  Bilirubin, UA     Negative Negative  Urobilinogen, Ur     0.2 - 1.0 mg/dL 0.2  Nitrite, UA     Negative Positive (A)  Microscopic Examination      See below:   Component     Latest Ref Rng & Units 05/16/2020  WBC, UA     0 - 5 /hpf >30 (A)  RBC     0 - 2 /hpf >30 (A)  Epithelial Cells (non renal)     0 - 10 /hpf 0-10  Renal Epithel, UA     None seen /hpf 0-10 (A)  Crystals     N/A Present (A)  Crystal Type     N/A Calcium Oxalate  Bacteria, UA     None seen/Few Moderate (A)    Results for orders placed or performed during the hospital encounter of 05/08/20  SARS CORONAVIRUS 2 (TAT 6-24 HRS) Nasopharyngeal Nasopharyngeal Swab   Specimen: Nasopharyngeal Swab  Result Value Ref Range   SARS Coronavirus 2 NEGATIVE NEGATIVE  I have reviewed the labs.   Pertinent Imaging: CLINICAL DATA:  Left-sided flank pain starting this afternoon. Nausea. History of gross hematuria.  EXAM: CT ABDOMEN AND PELVIS WITHOUT CONTRAST  TECHNIQUE: Multidetector CT imaging of the abdomen and pelvis was performed following the standard protocol without IV contrast.  COMPARISON:  08/29/2014  FINDINGS: Lower chest: Unremarkable.  Hepatobiliary: No focal abnormality in the liver on this study without intravenous contrast. There is no evidence for gallstones, gallbladder wall thickening, or pericholecystic fluid. No intrahepatic or extrahepatic biliary  dilation.  Pancreas: No focal mass lesion. No dilatation of the main duct. No intraparenchymal cyst. No peripancreatic edema.  Spleen: No splenomegaly. No focal mass lesion.  Adrenals/Urinary Tract: No adrenal nodule or mass.  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 No stones in the right kidney or ureter. No secondary changes in the right kidney/ureter.  No stones in the left kidney although there is mild fullness of the left intrarenal collecting system and renal pelvis. 2 x 2 x 4 mm stone is identified in the proximal left ureter just below the UPJ. No other left ureteral stone.  No bladder stones.  Stomach/Bowel: Stomach is unremarkable. No gastric wall thickening. No evidence of outlet obstruction. Duodenum is normally positioned as is the ligament of Treitz. No small bowel wall thickening. No small bowel dilatation. The terminal ileum is normal. The appendix is best seen on coronal images and is unremarkable. No gross colonic mass. No colonic wall thickening.  Vascular/Lymphatic: There is abdominal aortic atherosclerosis without aneurysm. There is no gastrohepatic or hepatoduodenal ligament lymphadenopathy. No retroperitoneal or mesenteric lymphadenopathy. No pelvic sidewall lymphadenopathy.  Reproductive: Uterine fibroids.  There is no adnexal mass.  Other: No intraperitoneal free fluid.  Musculoskeletal: Small sclerotic focus anterior left iliac bone is stable, compatible with bone island.  IMPRESSION: 1. 2 x 2 x 4 mm proximal left ureteral stone with mild fullness of the left intrarenal collecting system and renal pelvis. 2. 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 3. Uterine fibroids. 4. Aortic Atherosclerosis (ICD10-I70.0).   Electronically Signed   By: Misty Stanley M.D.   On: 04/19/2020 16:05  Assessment & Plan:    1.  Left ureteral stone - stone sent for analysis - results still pending - stent removed without difficulty  2. Left hydronephrosis - she is scheduled for renal ultrasound to rule out iatrogenic hydronephrosis - keep follow up with Dr. Erlene Quan on 06/05/2020  3. Gross hematuria - UA today demonstrates > 30 RBC's, but stent is still in place - continue to monitor the patient's UA after the treatment/passage of the stone to ensure the hematuria has resolved -I will also send a urine culture so that we may have it on file in case her symptoms do not resolve after removal of the stent  [facility] [address] [place], [place] [address] [phone]  Zara Council, PA-C

## 2020-05-16 ENCOUNTER — Ambulatory Visit (INDEPENDENT_AMBULATORY_CARE_PROVIDER_SITE_OTHER): Payer: 59 | Admitting: Urology

## 2020-05-16 ENCOUNTER — Encounter: Payer: Self-pay | Admitting: Urology

## 2020-05-16 ENCOUNTER — Other Ambulatory Visit: Payer: Self-pay

## 2020-05-16 VITALS — BP 171/117 | HR 94 | Ht 65.0 in | Wt 170.0 lb

## 2020-05-16 DIAGNOSIS — N1330 Unspecified hydronephrosis: Secondary | ICD-10-CM

## 2020-05-16 DIAGNOSIS — N201 Calculus of ureter: Secondary | ICD-10-CM | POA: Diagnosis not present

## 2020-05-16 DIAGNOSIS — R310 Gross hematuria: Secondary | ICD-10-CM

## 2020-05-17 ENCOUNTER — Other Ambulatory Visit (HOSPITAL_COMMUNITY): Payer: Self-pay | Admitting: Internal Medicine

## 2020-05-17 LAB — URINALYSIS, COMPLETE
Bilirubin, UA: NEGATIVE
Ketones, UA: NEGATIVE
Nitrite, UA: POSITIVE — AB
Specific Gravity, UA: >1.03 — ABNORMAL HIGH (ref 1.005–1.030)
Urobilinogen, Ur: 0.2 mg/dL (ref 0.2–1.0)
pH, UA: 5 (ref 5.0–7.5)

## 2020-05-17 LAB — MICROSCOPIC EXAMINATION
RBC, Urine: >30 /hpf — AB (ref 0–2)
WBC, UA: >30 /hpf — AB (ref 0–5)

## 2020-05-17 MED FILL — ATORVASTATIN 20 MG TABLET: 20 | 90 days supply | Qty: 90 | Fill #0

## 2020-05-17 MED FILL — EZETIMIBE 10 MG TABS: 10 | 90 days supply | Qty: 90 | Fill #0

## 2020-05-20 MED FILL — predniSONE 5 MG TABS: 5 | 30 days supply | Qty: 30 | Fill #2

## 2020-05-20 MED FILL — ZAFIRLUKAST 10 MG TAB: 10 | 90 days supply | Qty: 180 | Fill #3

## 2020-05-22 ENCOUNTER — Telehealth: Payer: Self-pay | Admitting: Family Medicine

## 2020-05-22 LAB — CALCULI, WITH PHOTOGRAPH (CLINICAL LAB)
Calcium Oxalate Dihydrate: 90 %
Calcium Oxalate Monohydrate: 10 %
Weight Calculi: 8 mg

## 2020-05-22 LAB — CULTURE, URINE COMPREHENSIVE

## 2020-05-22 MED ORDER — FOSFOMYCIN TROMETHAMINE 3 G PO PACK: PACK | ORAL | 0 refills | Status: DC

## 2020-05-22 NOTE — Telephone Encounter (Signed)
-----   Message from Nori Riis, PA-C sent at 05/22/2020  2:12 PM EDT ----- Please let Mrs. Fray know that her urine culture final result has returned and it is ESBL E. Coli.  I would recommend that she take the [facility] which would be 3 grams packet every other day for three doses, but it is sensitive to Macrobid.  I really don't advise the Macrobid as it does not have great tissue penetration.  If she decides to try the Macrobid first, we will need to switch to the [facility] if she feel no improvement or to IV antibiotics if she worsens - starts having fevers, chills, etc.

## 2020-05-22 NOTE — Telephone Encounter (Signed)
Patient notified and states she is not taking Macrobid. The Monurol has been sent to pharmacy.

## 2020-05-28 ENCOUNTER — Ambulatory Visit (INDEPENDENT_AMBULATORY_CARE_PROVIDER_SITE_OTHER): Payer: 59 | Admitting: Nurse Practitioner

## 2020-05-28 ENCOUNTER — Other Ambulatory Visit: Payer: Self-pay

## 2020-05-28 VITALS — BP 130/80 | HR 75 | Temp 97.1°F | Wt 174.0 lb

## 2020-05-28 DIAGNOSIS — R0602 Shortness of breath: Secondary | ICD-10-CM

## 2020-05-28 DIAGNOSIS — R439 Unspecified disturbances of smell and taste: Secondary | ICD-10-CM | POA: Diagnosis not present

## 2020-05-28 DIAGNOSIS — Z8616 Personal history of COVID-19: Secondary | ICD-10-CM | POA: Diagnosis not present

## 2020-05-28 NOTE — Progress Notes (Signed)
@Patient  ID: Rachel Fry, female    DOB: Sep 04, 1956, 64 y.o.   MRN: [id number]  Chief Complaint  Patient presents with   Follow-up    Still having fatigue, rash and taste comes and goes. Stated she also has a rash in her mouth that flared up in January as hasnt gone away since.     Referring provider: [name], MD   64 year old female with history of asthma, migraines, GERD, anxiety. Diagnosed with covid in September 2020 through health at work.   HPI  Patient presents today for post Covid care clinic visit follow-up.  Patient states that she tested positive in September with her health at work.  She works at [employer]. Patient states that since her last visit here she has been having ongoing altered taste and smell, shortness of breath, and rash. Patient did see cardiology and had a work-up including echo and EKG. Patient has been to see dermatology and does have an upcoming appointment with dermatology for treatment for rash. Patient does have a history of asthma and states that her shortness of breath has been ongoing since she was diagnosed with Covid. He does not seem to be improving. She states that her O2 sats have been staying stable. She states that her shortness of breath is more with exertion. She did recently have a spirometry at her PCP office that was overall normal. Her most recent chest x-ray was clear. Most recent labs were overall normal. She states that she is still having ongoing altered taste and smell. She states that most foods just do not smell or taste like they used to. Denies f/c/s, n/v/d, hemoptysis, PND, chest pain or edema.     Allergies  Allergen Reactions   Flonase [Fluticasone Propionate] Other (See Comments)    The scent in flonase causes wheezing    Latex Itching   Fish Oil Itching   Percocet [Oxycodone-Acetaminophen] Other (See Comments)    Reports her pharmacist told her never to take this medication because  of her codeine allergy   Sulfa Antibiotics     abdominal pain   Codeine Nausea And Vomiting and Rash    Tolerates tramadol     Immunization History  Administered Date(s) Administered   Influenza,inj,Quad PF,6+ Mos 08/27/2015   Tdap 10/11/2009    Past Medical History:  Diagnosis Date   Allergy    Asthma    History of kidney stones    Hypothyroidism    Migraines    Numbness and tingling    left side of body, occasionally right side    S/P Botox injection     Tobacco History: Social History   Tobacco Use  Smoking Status Never Smoker  Smokeless Tobacco Never Used   Counseling given: Yes   Outpatient Encounter Medications as of 05/28/2020  Medication Sig   albuterol (VENTOLIN HFA) 108 (90 Base) MCG/ACT inhaler Inhale 2 puffs into the lungs every 4 (four) hours as needed for wheezing or shortness of breath.    atorvastatin (LIPITOR) 20 MG tablet TAKE 1 TABLET (20 MG TOTAL) BY MOUTH DAILY. (Patient taking differently: Take 20 mg by mouth at bedtime. )   botulinum toxin Type A (BOTOX) 100 UNITS SOLR injection Botox - Historical Medication  once every 12 weeks for migraines Active   cetirizine (ZYRTEC) 10 MG tablet Take 10 mg by mouth every evening.    cloNIDine (CATAPRES) 0.1 MG tablet Take 1 tablet (0.1 mg total) by mouth 2 (two)  times daily.   EPINEPHrine 0.3 mg/0.3 mL IJ SOAJ injection Inject 0.3 mg into the muscle as needed for anaphylaxis.    Erenumab-aooe (AIMOVIG) 70 MG/ML SOAJ Inject 70 mg into the skin every 30 (thirty) days.    ezetimibe (ZETIA) 10 MG tablet Take 10 mg by mouth every evening.    fluticasone (FLOVENT HFA) 110 MCG/ACT inhaler Inhale 2 puffs into the lungs 2 (two) times daily.    fosfomycin (MONUROL) 3 g PACK Take 1 3 gram packet every other day for 3 doses.   frovatriptan (FROVA) 2.5 MG tablet Take 2.5 mg by mouth 2 (two) times daily as needed for migraine. Max of 3 doses per 7 days   furosemide (LASIX) 20 MG tablet Take 40 mg by  mouth daily as needed for edema.    lansoprazole (PREVACID) 30 MG capsule Take 30 mg by mouth every evening.    levothyroxine (SYNTHROID, LEVOTHROID) 50 MCG tablet Take 50 mcg by mouth daily before breakfast.    oxybutynin (DITROPAN) 5 MG tablet Take 1 tablet (5 mg total) by mouth 3 (three) times daily as needed for bladder spasms.   oxyCODONE-acetaminophen (PERCOCET/ROXICET) 5-325 MG tablet SMARTSIG:1 By Mouth 4-5 Times Daily   Polyethyl Glycol-Propyl Glycol (SYSTANE OP) Place 1-2 drops into both eyes 2 (two) times daily.   Rimegepant Sulfate (NURTEC) 75 MG TBDP Take 75 mg by mouth daily as needed (migraines). Max 15 doses per 30 days   rizatriptan (MAXALT) 10 MG tablet Take 10 mg by mouth 2 (two) times daily as needed for migraine. Max doses of 3 doses per 7 days   spironolactone (ALDACTONE) 25 MG tablet Take 50 mg by mouth daily.    tamsulosin (FLOMAX) 0.4 MG CAPS capsule Take 1 capsule (0.4 mg total) by mouth daily. (Patient taking differently: Take 0.4 mg by mouth daily after supper. )   traMADol (ULTRAM) 50 MG tablet Take 1 tablet (50 mg total) by mouth every 6 (six) hours as needed for moderate pain.   triamcinolone (NASACORT ALLERGY 24HR) 55 MCG/ACT AERO nasal inhaler Place 2 sprays into the nose at bedtime.   Vitamin D, Ergocalciferol, (DRISDOL) 1.25 MG (50000 UT) CAPS capsule Take 50,000 Units by mouth every Sunday.    zafirlukast (ACCOLATE) 10 MG tablet Take 10 mg by mouth 2 (two) times daily.    No facility-administered encounter medications on file as of 05/28/2020.     Review of Systems  Review of Systems  Constitutional: Negative.  Negative for chills and fever.  HENT: Negative.   Respiratory: Positive for shortness of breath. Negative for cough and wheezing.   Cardiovascular: Negative.  Negative for chest pain, palpitations and leg swelling.  Gastrointestinal: Negative.   Skin: Positive for rash.  Allergic/Immunologic: Negative.   Neurological: Negative.    Psychiatric/Behavioral: Negative.        Physical Exam  BP 130/80    Pulse 75    Temp (!) 97.1 F (36.2 C)    Wt 174 lb (78.9 kg)    SpO2 98%    BMI 28.96 kg/m   Wt Readings from Last 5 Encounters:  05/28/20 174 lb (78.9 kg)  05/16/20 170 lb (77.1 kg)  05/13/20 169 lb 12.1 oz (77 kg)  05/06/20 170 lb (77.1 kg)  04/23/20 172 lb (78 kg)     Physical Exam Vitals and nursing note reviewed.  Constitutional:      General: She is not in acute distress.    Appearance: She is well-developed.  Cardiovascular:  Rate and Rhythm: Normal rate and regular rhythm.  Pulmonary:     Effort: Pulmonary effort is normal.     Breath sounds: Normal breath sounds.  Neurological:     Mental Status: She is alert and oriented to person, place, and time.  Psychiatric:        Mood and Affect: Mood normal.        Behavior: Behavior normal.      Lab Results:  CBC    Component Value Date/Time   WBC 7.4 04/24/2020 0549   RBC 4.31 04/24/2020 0549   HGB 13.0 04/24/2020 0549   HGB 14.8 02/09/2020 1019   HCT 41.5 04/24/2020 0549   HCT 44.3 02/09/2020 1019   PLT 226 04/24/2020 0549   PLT 222 02/09/2020 1019   MCV 96.3 04/24/2020 0549   MCV 92 02/09/2020 1019   MCV 93 08/29/2014 1112   MCH 30.2 04/24/2020 0549   MCHC 31.3 04/24/2020 0549   RDW 13.2 04/24/2020 0549   RDW 12.4 02/09/2020 1019   RDW 13.4 08/29/2014 1112   LYMPHSABS 2.6 02/09/2020 1019   LYMPHSABS 2.5 08/10/2014 1447   MONOABS 0.8 04/01/2016 2050   MONOABS 0.6 08/10/2014 1447   EOSABS 0.0 02/09/2020 1019   EOSABS 0.1 08/10/2014 1447   BASOSABS 0.1 02/09/2020 1019   BASOSABS 0.1 08/10/2014 1447       Assessment & Plan:   History of COVID-19 Olfactory dysfunction:  Will place referral to Dr. Roddie Mc in neurology  Ongoing shortness of breath with exertion History of asthma History of allergies:  Will place referral to pulmonary - Dr. Vaughan Browner - may need CT, may need PFT  Continue current asthma  medications  Stay active  Encourage deep breathing  Please keep follow up with cardiology   Rash:  Please keep upcoming appointment with dermatology  May try 2 allegra and 2 zyrtec daily   Follow up:  Follow up as needed       Fenton Foy, NP 05/29/2020

## 2020-05-28 NOTE — Patient Instructions (Signed)
History of covid Olfactory dysfunction:  Will place referral to Dr. Roddie Mc in neurology  Ongoing shortness of breath with exertion History of asthma History of allergies:  Will place referral to pulmonary - Dr. Vaughan Browner - may need CT, may need PFT  Continue current asthma medications  Stay active  Encourage deep breathing  Please keep follow up with cardiology   Rash:  Please keep upcoming appointment with dermatology  May try 2 allegra and 2 zyrtec daily   Follow up:  Follow up as needed

## 2020-05-29 ENCOUNTER — Telehealth: Payer: Self-pay | Admitting: Neurology

## 2020-05-29 DIAGNOSIS — R439 Unspecified disturbances of smell and taste: Secondary | ICD-10-CM | POA: Insufficient documentation

## 2020-05-29 NOTE — Telephone Encounter (Signed)
Yes, this is for the olfactory deficit- we will follow her for loss or dysfunction of smell and taste.

## 2020-05-29 NOTE — Telephone Encounter (Signed)
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 She has previously been see by Dr. [name] and the referral is requesting Dr. Brett Fairy. Please advise if this switch is acceptable. Thank you.

## 2020-05-29 NOTE — Assessment & Plan Note (Signed)
Olfactory dysfunction:  Will place referral to Dr. Roddie Mc in neurology  Ongoing shortness of breath with exertion History of asthma History of allergies:  Will place referral to pulmonary - Dr. Vaughan Browner - may need CT, may need PFT  Continue current asthma medications  Stay active  Encourage deep breathing  Please keep follow up with cardiology   Rash:  Please keep upcoming appointment with dermatology  May try 2 allegra and 2 zyrtec daily   Follow up:  Follow up as needed

## 2020-05-31 ENCOUNTER — Ambulatory Visit
Admission: RE | Admit: 2020-05-31 | Discharge: 2020-05-31 | Disposition: A | Discharge status: Home / Self Care | Payer: 59 | Source: Ambulatory Visit | Attending: Urology | Admitting: Urology

## 2020-05-31 ENCOUNTER — Other Ambulatory Visit: Payer: Self-pay

## 2020-05-31 DIAGNOSIS — Z87442 Personal history of urinary calculi: Secondary | ICD-10-CM | POA: Diagnosis not present

## 2020-05-31 DIAGNOSIS — N1330 Unspecified hydronephrosis: Secondary | ICD-10-CM | POA: Diagnosis not present

## 2020-05-31 DIAGNOSIS — N132 Hydronephrosis with renal and ureteral calculous obstruction: Secondary | ICD-10-CM | POA: Diagnosis not present

## 2020-05-31 DIAGNOSIS — N201 Calculus of ureter: Secondary | ICD-10-CM | POA: Diagnosis not present

## 2020-06-03 MED FILL — FROVATRIPTAN SUCCINATE 2.5: 2.5 | 30 days supply | Qty: 18 | Fill #2

## 2020-06-03 NOTE — Telephone Encounter (Signed)
Fine with me - thanks!!

## 2020-06-04 DIAGNOSIS — M542 Cervicalgia: Secondary | ICD-10-CM | POA: Diagnosis not present

## 2020-06-04 NOTE — Progress Notes (Signed)
06/05/2020 11:07 AM   Rachel Fry Feb 10, 1956 [id number]  Referring provider: [name], [address] [place],  [place] [address] Chief Complaint  Patient presents with  . Follow-up    HPI: Rachel Fry is a 64 y.o. female presents today for follow up of hx of kidney stones and gross hematuria.  She is s/p left ureteroscopy, retrograde pyelogram, ureteral stent removal 7/19//2021. The post procedural course had been as expected and uneventful.   Stone composition analysis on 05/13/2020 showed 90% calcium oxalate dihydrate, 10% calcium oxalate monohydrate.  RUS on 05/31/2020 was negative.    She reports today that her flank pain is improved but she does have some pressure in her lower abdomen at times which comes and goes.  It is improving.  She has mild burning sensation.   She notes having a lot of food allergies. She does not drink or eat a lot of dairy products. She feels like she is not getting a lot of calcium in her diet.   She had the covid-19 infection and her taste and smell having not come back yet. She reports episodes of flare ups on her skin.  PMH: Past Medical History:  Diagnosis Date  . Allergy   . Asthma   . History of kidney stones   . Hypothyroidism   . Migraines   . Numbness and tingling    left side of body, occasionally right side   . S/P Botox injection     Surgical History: Past Surgical History:  Procedure Laterality Date  . ARM NEUROPLASTY Right 1994 and 1996   for chronic pain  . CESAREAN SECTION    . CYSTOSCOPY W/ RETROGRADES Left 04/23/2020   Procedure: CYSTOSCOPY WITH RETROGRADE PYELOGRAM;  Surgeon: Abbie Sons, MD;  Location: [facility];  Service: Urology;  Laterality: Left;  . CYSTOSCOPY W/ RETROGRADES Left 05/13/2020   Procedure: CYSTOSCOPY WITH RETROGRADE PYELOGRAM;  Surgeon: Hollice Espy, MD;  Location: [facility];  Service: Urology;  Laterality: Left;  . CYSTOSCOPY WITH STENT PLACEMENT Left 04/23/2020    Procedure: CYSTOSCOPY WITH STENT PLACEMENT;  Surgeon: Abbie Sons, MD;  Location: [facility];  Service: Urology;  Laterality: Left;  . CYSTOSCOPY/URETEROSCOPY/HOLMIUM LASER/STENT PLACEMENT Left 05/13/2020   Procedure: CYSTOSCOPY/URETEROSCOPY/LASER/STENT EXCHANGE;  Surgeon: Hollice Espy, MD;  Location: [facility];  Service: Urology;  Laterality: Left;  . OVARIAN CYST REMOVAL  1973  . STONE EXTRACTION WITH BASKET  05/13/2020   Procedure: STONE EXTRACTION WITH BASKET;  Surgeon: Hollice Espy, MD;  Location: [facility];  Service: Urology;;  . TONSILLECTOMY AND ADENOIDECTOMY    . TUBAL LIGATION  1988    Home Medications:  Allergies as of 06/05/2020      Reactions   Flonase [fluticasone Propionate] Other (See Comments)   The scent in flonase causes wheezing    Latex Itching   Fish Oil Itching   Percocet [oxycodone-acetaminophen] Other (See Comments)   Reports her pharmacist told her never to take this medication because of her codeine allergy   Sulfa Antibiotics    abdominal pain   Codeine Nausea And Vomiting, Rash   Tolerates tramadol       Medication List       Accurate as of June 05, 2020 11:07 AM. If you have any questions, ask your nurse or doctor.        STOP taking these medications   fosfomycin 3 g Pack Commonly known as: MONUROL Stopped by: Hollice Espy, MD   oxybutynin 5  MG tablet Commonly known as: DITROPAN Stopped by: Hollice Espy, MD   oxyCODONE-acetaminophen 5-325 MG tablet Commonly known as: PERCOCET/ROXICET Stopped by: Hollice Espy, MD   tamsulosin 0.4 MG Caps capsule Commonly known as: Flomax Stopped by: Hollice Espy, MD   traMADol 50 MG tablet Commonly known as: ULTRAM Stopped by: Hollice Espy, MD     TAKE these medications   Aimovig 70 MG/ML Soaj Generic drug: Erenumab-aooe Inject 70 mg into the skin every 30 (thirty) days.   atorvastatin 20 MG tablet Commonly known as: LIPITOR TAKE 1 TABLET (20 MG TOTAL) BY MOUTH DAILY. What  changed: See the new instructions.   botulinum toxin Type A 100 units Solr injection Commonly known as: BOTOX Botox - Historical Medication  once every 12 weeks for migraines Active   Catapres 0.1 MG tablet Generic drug: cloNIDine Take 1 tablet (0.1 mg total) by mouth 2 (two) times daily.   cetirizine 10 MG tablet Commonly known as: ZYRTEC Take 10 mg by mouth every evening.   EPINEPHrine 0.3 mg/0.3 mL Soaj injection Commonly known as: EPI-PEN Inject 0.3 mg into the muscle as needed for anaphylaxis.   ezetimibe 10 MG tablet Commonly known as: ZETIA Take 10 mg by mouth every evening.   fluticasone 110 MCG/ACT inhaler Commonly known as: FLOVENT HFA Inhale 2 puffs into the lungs 2 (two) times daily.   frovatriptan 2.5 MG tablet Commonly known as: FROVA Take 2.5 mg by mouth 2 (two) times daily as needed for migraine. Max of 3 doses per 7 days   furosemide 20 MG tablet Commonly known as: LASIX Take 40 mg by mouth daily as needed for edema.   lansoprazole 30 MG capsule Commonly known as: PREVACID Take 30 mg by mouth every evening.   levothyroxine 50 MCG tablet Commonly known as: SYNTHROID Take 50 mcg by mouth daily before breakfast.   Nasacort Allergy 24HR 55 MCG/ACT Aero nasal inhaler Generic drug: triamcinolone Place 2 sprays into the nose at bedtime.   Nurtec 75 MG Tbdp Generic drug: Rimegepant Sulfate Take 75 mg by mouth daily as needed (migraines). Max 15 doses per 30 days   rizatriptan 10 MG tablet Commonly known as: MAXALT Take 10 mg by mouth 2 (two) times daily as needed for migraine. Max doses of 3 doses per 7 days   spironolactone 25 MG tablet Commonly known as: ALDACTONE Take 50 mg by mouth daily.   SYSTANE OP Place 1-2 drops into both eyes 2 (two) times daily.   Ventolin HFA 108 (90 Base) MCG/ACT inhaler Generic drug: albuterol Inhale 2 puffs into the lungs every 4 (four) hours as needed for wheezing or shortness of breath.   Vitamin D  (Ergocalciferol) 1.25 MG (50000 UNIT) Caps capsule Commonly known as: DRISDOL Take 50,000 Units by mouth every Sunday.   zafirlukast 10 MG tablet Commonly known as: ACCOLATE Take 10 mg by mouth 2 (two) times daily.       Allergies:  Allergies  Allergen Reactions  . Flonase [Fluticasone Propionate] Other (See Comments)    The scent in flonase causes wheezing   . Latex Itching  . Fish Oil Itching  . Percocet [Oxycodone-Acetaminophen] Other (See Comments)    Reports her pharmacist told her never to take this medication because of her codeine allergy  . Sulfa Antibiotics     abdominal pain  . Codeine Nausea And Vomiting and Rash    Tolerates tramadol     Family History: Family History  Problem Relation Age of Onset  .  Hypertension Mother   . Breast cancer Mother 94  . Thyroid disease Mother   . Stroke Mother   . Dementia Mother   . Cardiomyopathy Father   . Peripheral Artery Disease Father   . Hyperlipidemia Brother   . Hypertension Brother   . Hypertension Brother   . Hyperlipidemia Brother   . Thyroid disease Brother   . Hypertension Brother   . Birth defects Brother   . Congenital heart disease Brother   . Cancer Brother        Kidney cancer  . Colon cancer Maternal Grandmother   . Stroke Paternal Grandfather   . Neuropathy Neg Hx   . Multiple sclerosis Neg Hx   . Migraines Neg Hx     Social History:  reports that she has never smoked. She has never used smokeless tobacco. She reports that she does not drink alcohol and does not use drugs.   Physical Exam: BP (!) 143/89   Pulse 80   Constitutional:  Alert and oriented, No acute distress. HEENT: [place] AT, moist mucus membranes.  Trachea midline, no masses. Cardiovascular: No clubbing, cyanosis, or edema. Respiratory: Normal respiratory effort, no increased work of breathing. Skin: No rashes, bruises or suspicious lesions. Neurologic: Grossly intact, no focal deficits, moving all 4 extremities. Psychiatric:  Normal mood and affect.  Laboratory Data:  Lab Results  Component Value Date   CREATININE 1.03 (H) 04/24/2020    Urinalysis Negative, see epic  Pertinent Imaging:  Results for orders placed during the hospital encounter of 05/31/20  US RENAL  Narrative CLINICAL DATA:  History of left ureteral stone and hydronephrosis  EXAM: RENAL / URINARY TRACT ULTRASOUND COMPLETE  COMPARISON:  CT 04/19/2020  FINDINGS: Right Kidney:  Renal measurements: 10.7 x 4.6 x 5.1 cm = volume: 131.6 mL . Echogenicity within normal limits. No mass or hydronephrosis visualized.  Left Kidney:  Renal measurements: 10.2 x 4.4 x 3.7 cm = volume: 85.1 mL. Echogenicity within normal limits. No mass or hydronephrosis visualized.  Bladder:  Appears normal for degree of bladder distention.  Other:  None.  IMPRESSION: Negative renal ultrasound   Electronically Signed By: Donavan Foil M.D. On: 05/31/2020 16:36   I have personally reviewed the images and agree with radiologist interpretation.   Assessment & Plan:    1. Left ureteral stone/hydronephrosis Status post uncomplicated left ureteroscopy, follow-up renal ultrasound reassuring without evidence of silent hydronephrosis or residual stone burden History of infrequent stone episodes We discussed general stone prevention techniques including drinking plenty water with goal of producing 2.5 L urine daily, increased citric acid intake, avoidance of high oxalate containing foods, and decreased salt intake.  Information about dietary recommendations given today. Offered 35-HGDJ urine metabolic evaluation, declined Stone composition reviewed Follow up [facility] in 1 year.   2.  Dysuria/pelvic pressure Urinalysis today unremarkable, no evidence of ongoing infection, suspect irritation from previous stent, improving, supportive care Return if worsens  Follow up in 1 year with [facility].  [facility] [address],  [place], [place] [address] [phone]  I, Selena Batten, am acting as a scribe for Dr. Hollice Espy.  I have reviewed the above documentation for accuracy and completeness, and I agree with the above.   Hollice Espy, MD

## 2020-06-05 ENCOUNTER — Other Ambulatory Visit: Payer: Self-pay

## 2020-06-05 ENCOUNTER — Ambulatory Visit (INDEPENDENT_AMBULATORY_CARE_PROVIDER_SITE_OTHER): Payer: 59 | Admitting: Urology

## 2020-06-05 VITALS — BP 143/89 | HR 80

## 2020-06-05 DIAGNOSIS — N201 Calculus of ureter: Secondary | ICD-10-CM | POA: Diagnosis not present

## 2020-06-06 LAB — MICROSCOPIC EXAMINATION

## 2020-06-06 LAB — URINALYSIS, COMPLETE
Bilirubin, UA: NEGATIVE
Glucose, UA: NEGATIVE
Ketones, UA: NEGATIVE
Leukocytes,UA: NEGATIVE
Nitrite, UA: NEGATIVE
Protein,UA: NEGATIVE
RBC, UA: NEGATIVE
Specific Gravity, UA: >1.03 — ABNORMAL HIGH (ref 1.005–1.030)
Urobilinogen, Ur: 0.2 mg/dL (ref 0.2–1.0)
pH, UA: 5 (ref 5.0–7.5)

## 2020-06-07 MED FILL — AIMOVIG 140 MG/ML SOAJ: 140 | 30 days supply | Qty: 1 | Fill #5

## 2020-06-07 MED FILL — NURTEC 75 MG TBDP: 75 | 30 days supply | Qty: 8 | Fill #2

## 2020-06-09 ENCOUNTER — Encounter: Payer: Self-pay | Admitting: Urology

## 2020-06-09 LAB — CULTURE, URINE COMPREHENSIVE

## 2020-06-11 NOTE — Telephone Encounter (Signed)
Discussed symptoms with patient. Denies fevers, chills, body aches, dysuria. She complains of irritation due to stent that was removed 2 weeks ago. States "just feels irritated"  Offered appointment for evaluation, declined. Would call back if worsens.

## 2020-06-13 MED FILL — predniSONE 5 MG TABS: 5 | 30 days supply | Qty: 30 | Fill #3

## 2020-06-25 ENCOUNTER — Encounter: Payer: Self-pay | Admitting: Allergy & Immunology

## 2020-06-25 ENCOUNTER — Ambulatory Visit (INDEPENDENT_AMBULATORY_CARE_PROVIDER_SITE_OTHER): Payer: 59 | Admitting: Allergy & Immunology

## 2020-06-25 ENCOUNTER — Other Ambulatory Visit: Payer: Self-pay

## 2020-06-25 VITALS — BP 160/102 | HR 89 | Temp 97.9°F | Resp 18

## 2020-06-25 DIAGNOSIS — R5383 Other fatigue: Secondary | ICD-10-CM

## 2020-06-25 DIAGNOSIS — L508 Other urticaria: Secondary | ICD-10-CM

## 2020-06-25 NOTE — Patient Instructions (Addendum)
1. Post COVID syndrome with urticarial rash - We are going to submit for initiation of Xolair. - Tammy will reach out to you with the approval process. - Xolair consent signed today.  2. Return in about 3 months (around 09/24/2020).    Please inform us of any Emergency Department visits, hospitalizations, or changes in symptoms. Call us before going to the ED for breathing or allergy symptoms since we might be able to fit you in for a sick visit. Feel free to contact us anytime with any questions, problems, or concerns.  It was a pleasure to see you again today!  Websites that have reliable patient information: 1. American Academy of Asthma, Allergy, and Immunology: www.aaaai.org 2. Food Allergy Research and Education (FARE): foodallergy.org 3. Mothers of Asthmatics: http://www.asthmacommunitynetwork.org 4. American College of Allergy, Asthma, and Immunology: www.acaai.org   COVID-19 Vaccine Information can be found at: ShippingScam.co.uk For questions related to vaccine distribution or appointments, please email vaccine@[email or web address] [email or web address] or call [phone].     Like [place] on [place] and Instagram for our latest updates!        Make sure you are registered to vote! If you have moved or changed any of your contact information, you will need to get this updated before voting!  In some cases, you MAY be able to register to vote online: [email or web address]

## 2020-06-25 NOTE — Progress Notes (Addendum)
FOLLOW UP  Date of Service/Encounter:  06/25/20   Assessment:   Post COVID syndrome  Biopsy consistent with urticaria     Rachel Fry presents for follow-up visit.  She has not been seen since April and she underwent component testing to the COVID-19 vaccines.  She did successfully go and received both vaccine.  She is very happy about.  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  Her most concerning symptom for her is her fatigue and this rash.  The rash has been biopsied and is consistent with chronic idiopathic urticaria.  She has been on antihistamines with some relief, but she has had any been more fatigued from those.  She would like to go ahead and advance to Xolair.  You spent some time talking about it today and she did sign the consent form.  I did emphasize that this could take a few months to start working and cannot expect "magic bullet".   Plan/Recommendations:   1. Post COVID syndrome with urticarial rash - We are going to submit for initiation of Xolair. - Tammy will reach out to you with the approval process. - Xolair consent signed today.  2. Return in about 3 months (around 09/24/2020).   Subjective:   Rachel Fry is a 64 y.o. female presenting today for follow up of  Chief Complaint  Patient presents with  . Allergic Reaction    Covid vaccine  . Rash    2020    Rachel Fry has a history of the following: Patient Active Problem List   Diagnosis Date Noted  . Olfactory impairment 05/29/2020  . Acute UTI 04/23/2020  . Left ureteral stone 04/23/2020  . Intermittent chest pain 02/09/2020  . History of COVID-19 02/09/2020  . Shortness of breath 02/09/2020  . Loss of taste 02/09/2020  . Loss of smell 02/09/2020  . Rash 02/09/2020  . Arthralgia 02/09/2020  . Postoperative history of checked last year 04/22/2017  . Weakness of left arm 04/22/2017  . Numbness and tingling in left arm 04/22/2017  . Lower back pain  04/02/2016  . Acute stress disorder 02/25/2015  . Anxiety 02/25/2015  . Airway hyperreactivity 02/25/2015  . Bradycardia 02/25/2015  . Hypersomnia 02/25/2015  . Clinical depression 02/25/2015  . Elevated blood sugar 02/25/2015  . 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 Acid reflux 02/25/2015  . 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 H/O disease 02/25/2015  . Hypercholesteremia 02/25/2015  . Headache, migraine 02/25/2015  . Muscle ache 02/25/2015  . Allergic rhinitis, seasonal 02/25/2015  . Avitaminosis D 02/25/2015    History obtained from: chart review and patient.   Rachel Fry is a 64 y.o. female presenting for a follow up visit.  She was last seen in April 2021.  At that time, she underwent testing to COVID-19 vaccine components and this was all negative.  We did recommend that she go ahead and get the vaccine.  She was experiencing symptoms of post COVID-19 syndrome.  This included the existence of a rash as well as fatigue and shortness of breath.  We referred her to the post Covid clinic for better management.  Since the last visit, she has continued to have the fatigue, SOB, and fatigue. She did have a biopsy done that demonstrated urticarial allergic reaction with prominent neutrophilic and eosinophilic infiltrate.  Per the report, the pattern was most commonly seen in chronic idiopathic or physical urticaria.  She is on antihistamines twice daily, but this makes her fatigue much  worse.  She did get vaccinated. She took two Zyrtec before. She was tired after the second injection but otherwise fine.  She is very happy to have had her COVID-19 vaccine, although it did not seem to change the trajectory of her post COVID-19 syndrome.  She did go to the post-COVID clinic and had an EKG that showed enlarged heart (thought to be from incorrect placement of the electrodes, per the patient).  She did go to see Cardiology and a repeat EKG was completely normal. It seems that they set her up with a bunch of post  COVID-19 visits, including 1 with Dr. Vaughan Browner in Pulmonology in September.  She has an appointment in October with Dr. Brett Fairy with [facility] to address her loss of smell.  She has been on double cetirizine AM/PM, Pepcid, Benadryl, etc. This does take the edge off. She was on hydroxyzine at one point, but she was very fatigued from this.   Otherwise, there have been no changes to her past medical history, surgical history, family history, or social history.    Review of Systems  Constitutional: Positive for malaise/fatigue. Negative for fever and weight loss.  HENT: Positive for congestion. Negative for ear discharge and ear pain.   Eyes: Negative for pain, discharge and redness.  Respiratory: Negative for cough, sputum production, shortness of breath and wheezing.   Cardiovascular: Negative.  Negative for chest pain and palpitations.  Gastrointestinal: Negative for abdominal pain and heartburn.  Skin: Positive for itching and rash.  Neurological: Negative for dizziness and headaches.  Endo/Heme/Allergies: Negative for environmental allergies. Does not bruise/bleed easily.       Objective:   Blood pressure (!) 160/102, pulse 89, temperature 97.9 F (36.6 C), temperature source Temporal, resp. rate 18, SpO2 98 %. There is no height or weight on file to calculate BMI.   Physical Exam:  Physical Exam Constitutional:      Appearance: She is well-developed.  HENT:     Head: Normocephalic and atraumatic.     Right Ear: Tympanic membrane, ear canal and external ear normal.     Left Ear: Tympanic membrane and ear canal normal.     Nose: No nasal deformity, septal deviation, mucosal edema or rhinorrhea.     Right Sinus: No maxillary sinus tenderness or frontal sinus tenderness.     Left Sinus: No maxillary sinus tenderness or frontal sinus tenderness.     Mouth/Throat:     Mouth: Mucous membranes are not pale and not dry.     Pharynx: Uvula midline.  Eyes:     General:         Right eye: No discharge.        Left eye: No discharge.     Conjunctiva/sclera: Conjunctivae normal.     Right eye: Right conjunctiva is not injected. No chemosis.    Left eye: Left conjunctiva is not injected. No chemosis.    Pupils: Pupils are equal, round, and reactive to light.  Cardiovascular:     Rate and Rhythm: Normal rate and regular rhythm.     Heart sounds: Normal heart sounds.  Pulmonary:     Effort: Pulmonary effort is normal. No tachypnea, accessory muscle usage or respiratory distress.     Breath sounds: Normal breath sounds. No wheezing, rhonchi or rales.  Chest:     Chest wall: No tenderness.  Lymphadenopathy:     Cervical: No cervical adenopathy.  Skin:    Coloration: Skin is not pale.     Findings:  No abrasion, erythema, petechiae or rash. Rash is not papular, urticarial or vesicular.     Comments: Continue urticarial rash on her bilateral arms as well as other areas of her body.  Similar to the last visit, these lesions are flatter than I would expect for run-of-the-mill urticaria.  Neurological:     Mental Status: She is alert.      Diagnostic studies: none    Salvatore Marvel, MD  [facility]

## 2020-06-26 ENCOUNTER — Encounter: Payer: Self-pay | Admitting: Allergy & Immunology

## 2020-07-02 ENCOUNTER — Other Ambulatory Visit: Payer: Self-pay | Admitting: Internal Medicine

## 2020-07-02 ENCOUNTER — Telehealth: Payer: Self-pay | Admitting: *Deleted

## 2020-07-02 ENCOUNTER — Ambulatory Visit (HOSPITAL_BASED_OUTPATIENT_CLINIC_OR_DEPARTMENT_OTHER): Payer: 59 | Admitting: Pharmacist

## 2020-07-02 ENCOUNTER — Other Ambulatory Visit: Payer: Self-pay

## 2020-07-02 DIAGNOSIS — Z79899 Other long term (current) drug therapy: Secondary | ICD-10-CM

## 2020-07-02 MED ORDER — OMALIZUMAB 150 MG ~~LOC~~ SOLR
300.0000 mg | SUBCUTANEOUS | 11 refills | Status: DC
Start: 2020-07-02 — End: 2020-07-02

## 2020-07-02 MED ORDER — OMALIZUMAB 150 MG ~~LOC~~ SOLR: 300.0000 mg | SUBCUTANEOUS | 11 refills | Status: DC

## 2020-07-02 MED FILL — FROVATRIPTAN SUCCINATE 2.5: 2.5 | 30 days supply | Qty: 18 | Fill #3

## 2020-07-02 MED FILL — BOTOX 200 UNITS VIAL: 200 | 90 days supply | Qty: 1 | Fill #1

## 2020-07-02 MED FILL — NURTEC 75 MG TBDP: 75 | 30 days supply | Qty: 8 | Fill #3

## 2020-07-02 MED FILL — AIMOVIG 140 MG/ML SOAJ: 140 | 30 days supply | Qty: 1 | Fill #0

## 2020-07-02 NOTE — Telephone Encounter (Signed)
-----   Message from Valentina Shaggy, MD sent at 06/26/2020  5:57 AM EDT ----- New start Xolair.  We did not give a sample.  Start with 300 mg every 28 days?

## 2020-07-02 NOTE — Telephone Encounter (Signed)
Spoke to patient and advised approval, copay card and submit to [place].  Once she receives or picks up Rx to store in [place] and reach out to clinic to make start appt, RX sent

## 2020-07-02 NOTE — Progress Notes (Signed)
S: Patient presents for review of their specialty medication therapy.  Patient is currently taking Xolair for urticaria. Patient is managed by Dr. Ernst Bowler for this.   Adherence: has not started   Efficacy: has not started  Dosing: Give subcutaneously.    Asthma: Chronic idiopathic urticaria: SubQ: 150 or 300 mg every 4 weeks. Dosing is not dependent on serum IgE (free or total) level or body weight.  Dose adjustments: Renal: no dose adjustments  Hepatic: no dose adjustments  Toxicity: Severe hypersensitivity reaction or anaphylaxis: Discontinue treatment. Fever, arthralgia, and rash: Discontinue treatment if this constellation of symptoms occurs.  Drug-drug interactions: no interactions identified   Monitoring: CV effects: has not yet started  Eosinophilia and vasculitis: has not yet started  Fever/arthralgia/rash: has not yet started  Hypersensitivity/Anaphylaxis: has not yet started  Malignant neoplasms: has not yet started  Pulmonary function tests (for asthma): has not yet started   O:  Lab Results  Component Value Date   WBC 7.4 04/24/2020   HGB 13.0 04/24/2020   HCT 41.5 04/24/2020   MCV 96.3 04/24/2020   PLT 226 04/24/2020      Chemistry      Component Value Date/Time   NA 143 04/24/2020 0549   NA 146 (H) 02/09/2020 1019   NA 139 08/29/2014 1112   K 3.8 04/24/2020 0549   K 4.3 08/29/2014 1112   CL 106 04/24/2020 0549   CL 103 08/29/2014 1112   CO2 31 04/24/2020 0549   CO2 33 (H) 08/29/2014 1112   BUN 15 04/24/2020 0549   BUN 11 02/09/2020 1019   BUN 17 08/29/2014 1112   CREATININE 1.03 (H) 04/24/2020 0549   CREATININE 0.98 08/29/2014 1112   GLU 83 03/30/2014 0000      Component Value Date/Time   CALCIUM 9.3 04/24/2020 0549   CALCIUM 9.3 08/29/2014 1112   ALKPHOS 80 02/09/2020 1019   ALKPHOS 69 08/29/2014 1112   AST 12 02/09/2020 1019   AST 19 08/29/2014 1112   ALT 14 02/09/2020 1019   ALT 23 08/29/2014 1112   BILITOT 0.4 02/09/2020  1019   BILITOT 0.3 08/29/2014 1112       A/P: 1. Medication review: patient currently on Xolair for urticaria. Reviewed the medication with the patient, including the following: Xolair, omalizumab, is a novel IgE blocker.  It appears to reduce rates of hospitalizations, ER visits and unscheduled physician visits due asthma exacerbations when added to standard therapy.  Studies also show a reduction in steroid requirements and improvement in quality of life.  Patient educated on purpose, proper use and potential adverse effects of Xolair.  Following instruction patient verbalized understanding. Patient should always have an EpiPen readily available in the event of anaphylaxis. SubQ: For SubQ injection only; doses >150 mg should be divided over more than one injection site (eg, 225 mg or 300 mg administered as two injections, 375 mg administered as three injections); each injection site should be separated by ?1 inch. Do not inject into moles, scars, bruises, tender areas, or broken skin. Injections may take 5 to 10 seconds to administer (solution is slightly viscous). Administer only under direct medical supervision and observe patient for 2 hours after the first 3 injections and 30 minutes after subsequent injections Dellia Cloud 2015) or in accordance with individual institution policies and procedures.No recommendations for any changes at this time.    Benard Halsted, PharmD, [place] [phone]

## 2020-07-09 DIAGNOSIS — G43709 Chronic migraine without aura, not intractable, without status migrainosus: Secondary | ICD-10-CM | POA: Diagnosis not present

## 2020-07-10 MED FILL — XOLAIR 150 MG SOLR: 150 | 28 days supply | Qty: 2 | Fill #0

## 2020-07-12 ENCOUNTER — Other Ambulatory Visit (HOSPITAL_COMMUNITY): Payer: Self-pay | Admitting: Internal Medicine

## 2020-07-12 MED FILL — LEVOTHYROXINE 50 MCG TABLET: 50 | 90 days supply | Qty: 90 | Fill #2

## 2020-07-12 MED FILL — CloNIDine HCL 0.1 MG TAB: 0.1 | 90 days supply | Qty: 180 | Fill #0

## 2020-07-16 ENCOUNTER — Other Ambulatory Visit: Payer: Self-pay

## 2020-07-16 ENCOUNTER — Ambulatory Visit (INDEPENDENT_AMBULATORY_CARE_PROVIDER_SITE_OTHER): Payer: 59 | Admitting: *Deleted

## 2020-07-16 DIAGNOSIS — L501 Idiopathic urticaria: Secondary | ICD-10-CM | POA: Diagnosis not present

## 2020-07-16 MED ORDER — OMALIZUMAB 150 MG ~~LOC~~ SOLR
300.0000 mg | SUBCUTANEOUS | Status: DC
  Administered 2020-07-16 – 2022-07-07 (×25): 300 mg via SUBCUTANEOUS

## 2020-07-16 NOTE — Progress Notes (Signed)
Immunotherapy   Patient Details  Name: Rachel Fry MRN: [id number] Date of Birth: 1955-11-30  07/16/2020  Rachel Fry started injections for  Xolair   Frequency: Every 4 Weeks  Epi-Pen:Epi-Pen Available  Consent signed and patient instructions given. Patient started Xolair today and received a 300mg  dose. Patient waited 30 minutes in clinic and did not experience any issues.    Rachel Fry 07/16/2020, 6:19 PM

## 2020-07-19 ENCOUNTER — Encounter: Payer: Self-pay | Admitting: Pulmonary Disease

## 2020-07-19 ENCOUNTER — Other Ambulatory Visit: Payer: Self-pay

## 2020-07-19 ENCOUNTER — Other Ambulatory Visit (HOSPITAL_COMMUNITY): Payer: Self-pay | Admitting: Internal Medicine

## 2020-07-19 ENCOUNTER — Ambulatory Visit (INDEPENDENT_AMBULATORY_CARE_PROVIDER_SITE_OTHER): Payer: 59 | Admitting: Pulmonary Disease

## 2020-07-19 VITALS — BP 138/74 | HR 95 | Temp 97.7°F | Ht 65.0 in | Wt 178.2 lb

## 2020-07-19 DIAGNOSIS — R0600 Dyspnea, unspecified: Secondary | ICD-10-CM | POA: Diagnosis not present

## 2020-07-19 DIAGNOSIS — Z8616 Personal history of COVID-19: Secondary | ICD-10-CM | POA: Diagnosis not present

## 2020-07-19 DIAGNOSIS — R0609 Other forms of dyspnea: Secondary | ICD-10-CM

## 2020-07-19 DIAGNOSIS — R0602 Shortness of breath: Secondary | ICD-10-CM | POA: Diagnosis not present

## 2020-07-19 MED FILL — predniSONE 5 MG TABS: 5 | 30 days supply | Qty: 30 | Fill #0

## 2020-07-19 NOTE — Progress Notes (Signed)
Rachel Fry    [id number]    01/14/1956  Primary Care Physician:Shelton, Joelene Millin, MD  Referring Physician: Fenton Foy, NP [place],  [place] [address]  Chief complaint: Consult for dyspnea, post COVID-14  HPI: 64 year old with history of asthma, idiopathic urticaria, hypertension, hyperlipidemia, allergies Diagnosed with COVID-19 in September 2020.  She did not require hospitalization.  Treated with antibiotics, steroids Continues to have significant dyspnea since then.  She is symptomatic with sitting and with exertion, occasional wheeze.  Denies any cough. She was given a sample of breztri by her primary care but had to stop due to hoarseness of voice.  She did not notice any difference with the inhaler  Follows with Dr. 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  No mold, hot tub, Jacuzzi.  No feather pillows or comforters Smoking history: Never smoker Travel history: No significant travel history Relevant family history: Brother had asthma   Outpatient Encounter Medications as of 07/19/2020  Medication Sig  . albuterol (VENTOLIN HFA) 108 (90 Base) MCG/ACT inhaler Inhale 2 puffs into the lungs every 4 (four) hours as needed for wheezing or shortness of breath.   Marland Kitchen atorvastatin (LIPITOR) 20 MG tablet TAKE 1 TABLET (20 MG TOTAL) BY MOUTH DAILY. (Patient taking differently: Take 20 mg by mouth at bedtime. )  . botulinum toxin Type A (BOTOX) 100 UNITS SOLR injection Botox - Historical Medication  once every 12 weeks for migraines Active  . cetirizine (ZYRTEC) 10 MG tablet Take 10 mg by mouth every evening.   . cloNIDine (CATAPRES) 0.1 MG tablet Take 1 tablet (0.1 mg total) by mouth 2 (two) times daily.  Marland Kitchen EPINEPHrine 0.3 mg/0.3 mL IJ SOAJ injection Inject 0.3 mg into the  muscle as needed for anaphylaxis.   Eduard Roux (AIMOVIG) 70 MG/ML SOAJ Inject 70 mg into the skin every 30 (thirty) days.   Marland Kitchen ezetimibe (ZETIA) 10 MG tablet Take 10 mg by mouth every evening.   . fluticasone (FLOVENT HFA) 110 MCG/ACT inhaler Inhale 2 puffs into the lungs 2 (two) times daily.   . frovatriptan (FROVA) 2.5 MG tablet Take 2.5 mg by mouth 2 (two) times daily as needed for migraine. Max of 3 doses per 7 days  . furosemide (LASIX) 20 MG tablet Take 40 mg by mouth daily as needed for edema.   . lansoprazole (PREVACID) 30 MG capsule Take 30 mg by mouth every evening.   Marland Kitchen levothyroxine (SYNTHROID, LEVOTHROID) 50 MCG tablet Take 50 mcg by mouth daily before breakfast.   . omalizumab (XOLAIR) 150 MG injection Inject 300 mg into the skin every 28 (twenty-eight) days.  Vladimir Faster Glycol-Propyl Glycol (SYSTANE OP) Place 1-2 drops into both eyes 2 (two) times daily.  . Rimegepant Sulfate (NURTEC) 75 MG TBDP Take 75 mg by mouth daily as needed (migraines). Max 15 doses per 30 days  . rizatriptan (MAXALT) 10 MG tablet Take 10 mg by mouth 2 (two) times daily as needed for migraine. Max doses of 3 doses per 7 days  . spironolactone (ALDACTONE) 25 MG tablet Take 50 mg by mouth daily.   Marland Kitchen triamcinolone (NASACORT ALLERGY 24HR) 55 MCG/ACT AERO nasal inhaler Place 2 sprays into the nose at bedtime.  . Vitamin D, Ergocalciferol, (DRISDOL) 1.25 MG (50000 UT) CAPS capsule Take 50,000  Units by mouth every Sunday.   . zafirlukast (ACCOLATE) 10 MG tablet Take 10 mg by mouth 2 (two) times daily.    Facility-Administered Encounter Medications as of 07/19/2020  Medication  . omalizumab Arvid Right) injection 300 mg    Allergies as of 07/19/2020 - Review Complete 07/19/2020  Allergen Reaction Noted  . Flonase [fluticasone propionate] Other (See Comments)   . Latex Itching   . Fish oil Itching 02/25/2015  . Percocet [oxycodone-acetaminophen] Other (See Comments) 05/13/2020  . Sulfa antibiotics  02/25/2015    . Codeine Nausea And Vomiting and Rash 02/25/2015    Past Medical History:  Diagnosis Date  . Allergy   . Asthma   . History of kidney stones   . Hypothyroidism   . Migraines   . Numbness and tingling    left side of body, occasionally right side   . S/P Botox injection     Past Surgical History:  Procedure Laterality Date  . ARM NEUROPLASTY Right 1994 and 1996   for chronic pain  . CESAREAN SECTION    . CYSTOSCOPY W/ RETROGRADES Left 04/23/2020   Procedure: CYSTOSCOPY WITH RETROGRADE PYELOGRAM;  Surgeon: Abbie Sons, MD;  Location: [facility];  Service: Urology;  Laterality: Left;  . CYSTOSCOPY W/ RETROGRADES Left 05/13/2020   Procedure: CYSTOSCOPY WITH RETROGRADE PYELOGRAM;  Surgeon: Hollice Espy, MD;  Location: [facility];  Service: Urology;  Laterality: Left;  . CYSTOSCOPY WITH STENT PLACEMENT Left 04/23/2020   Procedure: CYSTOSCOPY WITH STENT PLACEMENT;  Surgeon: Abbie Sons, MD;  Location: [facility];  Service: Urology;  Laterality: Left;  . CYSTOSCOPY/URETEROSCOPY/HOLMIUM LASER/STENT PLACEMENT Left 05/13/2020   Procedure: CYSTOSCOPY/URETEROSCOPY/LASER/STENT EXCHANGE;  Surgeon: Hollice Espy, MD;  Location: [facility];  Service: Urology;  Laterality: Left;  . OVARIAN CYST REMOVAL  1973  . STONE EXTRACTION WITH BASKET  05/13/2020   Procedure: STONE EXTRACTION WITH BASKET;  Surgeon: Hollice Espy, MD;  Location: [facility];  Service: Urology;;  . TONSILLECTOMY AND ADENOIDECTOMY    . TUBAL LIGATION  1988    Family History  Problem Relation Age of Onset  . Hypertension Mother   . Breast cancer Mother 14  . Thyroid disease Mother   . Stroke Mother   . Dementia Mother   . Cardiomyopathy Father   . Peripheral Artery Disease Father   . Hyperlipidemia Brother   . Hypertension Brother   . Hypertension Brother   . Hyperlipidemia Brother   . Thyroid disease Brother   . Hypertension Brother   . Birth defects Brother   . Congenital heart disease Brother   . Cancer  Brother        Kidney cancer  . Colon cancer Maternal Grandmother   . Stroke Paternal Grandfather   . Neuropathy Neg Hx   . Multiple sclerosis Neg Hx   . Migraines Neg Hx     Social History   Socioeconomic History  . Marital status: Married    Spouse name: [name]  . Number of children: 3  . Years of education: 37  . Highest education level: Not on file  Occupational History  . Occupation: [employer] re[employer]  Tobacco Use  . Smoking status: Never Smoker  . Smokeless tobacco: Never Used  Vaping Use  . Vaping Use: Never used  Substance and Sexual Activity  . Alcohol use: No  . Drug use: No  . Sexual activity: Not on file  Other Topics Concern  . Not on file  Social History Narrative   Lives with husband  Caffeine use: none   Left handed   Social Determinants of Health   Financial Resource Strain:   . Difficulty of Paying Living Expenses: Not on file  Food Insecurity:   . Worried About Charity fundraiser in the Last Year: Not on file  . Ran Out of Food in the Last Year: Not on file  Transportation Needs:   . Lack of Transportation (Medical): Not on file  . Lack of Transportation (Non-Medical): Not on file  Physical Activity:   . Days of Exercise per Week: Not on file  . Minutes of Exercise per Session: Not on file  Stress:   . Feeling of Stress : Not on file  Social Connections:   . Frequency of Communication with Friends and Family: Not on file  . Frequency of Social Gatherings with Friends and Family: Not on file  . Attends Religious Services: Not on file  . Active Member of Clubs or Organizations: Not on file  . Attends Archivist Meetings: Not on file  . Marital Status: Not on file  Intimate Partner Violence:   . Fear of Current or Ex-Partner: Not on file  . Emotionally Abused: Not on file  . Physically Abused: Not on file  . Sexually Abused: Not on file    Review of systems: Review of Systems  Constitutional: Negative for fever and chills.    HENT: Negative.   Eyes: Negative for blurred vision.  Respiratory: as per HPI  Cardiovascular: Negative for chest pain and palpitations.  Gastrointestinal: Negative for vomiting, diarrhea, blood per rectum. Genitourinary: Negative for dysuria, urgency, frequency and hematuria.  Musculoskeletal: Negative for myalgias, back pain and joint pain.  Skin: Negative for itching and rash.  Neurological: Negative for dizziness, tremors, focal weakness, seizures and loss of consciousness.  Endo/Heme/Allergies: Negative for environmental allergies.  Psychiatric/Behavioral: Negative for depression, suicidal ideas and hallucinations.  All other systems reviewed and are negative.  Physical Exam: Blood pressure 138/74, pulse 95, temperature 97.7 F (36.5 C), temperature source Oral, height 5\' 5"  (1.651 m), weight 178 lb 3.2 oz (80.8 kg), SpO2 96 %. Gen:      No acute distress HEENT:  EOMI, sclera anicteric Neck:     No masses; no thyromegaly Lungs:    Clear to auscultation bilaterally; normal respiratory effort CV:         Regular rate and rhythm; no murmurs Abd:      + bowel sounds; soft, non-tender; no palpable masses, no distension Ext:    No edema; adequate peripheral perfusion Skin:      Warm and dry; no rash Neuro: alert and oriented x 3 Psych: normal mood and affect  Data Reviewed: Imaging: Chest x-ray 02/14/2020-no acute cardiopulmonary abnormality CT renal stone 04/19/2020-visualized lung bases are clear I have reviewed the images personally.  PFTs:  Labs:  Assessment:  Post COVID-19, long-haul syndrome Continues to have significant dyspnea, loss of taste, mild brain fog Also has utricairal rash which is thought to be related to COVID-19  Lung imaging with no significant lung abnormalities but given persistent symptoms we will get high-res CT and PFTs for better evaluation  Suspect component of deconditioning.  Will refer her to pulmonary rehab at [facility]  Asthma Currently on  Flovent and Nasacort She has not tolerated breztri.  We will hold off on changing her inhalers until we review her PFTs  Plan/Recommendations: High-res CT, PFTs Pulmonary rehab  Marshell Garfinkel MD [facility] 07/19/2020, 4:08 PM  CC:  Fenton Foy, NP

## 2020-07-19 NOTE — Patient Instructions (Signed)
We will schedule high-resolution CT at [facility] for further evaluation of the lung Schedule pulmonary function test Follow-up in 1 to 2 months for review of these tests

## 2020-07-20 MED FILL — FLOVENT HFA 110 MCG INHALER: 110 | 90 days supply | Qty: 36 | Fill #2

## 2020-07-20 MED FILL — SPIRONOLACTONE 25 MG TABS: 25 | 90 days supply | Qty: 180 | Fill #3

## 2020-07-24 ENCOUNTER — Other Ambulatory Visit: Payer: Self-pay | Admitting: *Deleted

## 2020-07-24 ENCOUNTER — Encounter: Payer: Self-pay | Admitting: *Deleted

## 2020-07-24 DIAGNOSIS — R0600 Dyspnea, unspecified: Secondary | ICD-10-CM

## 2020-07-29 ENCOUNTER — Encounter: Payer: 59 | Attending: Pulmonary Disease

## 2020-07-29 ENCOUNTER — Other Ambulatory Visit: Payer: Self-pay

## 2020-07-29 VITALS — Ht 65.0 in | Wt 180.2 lb

## 2020-07-29 DIAGNOSIS — R0600 Dyspnea, unspecified: Secondary | ICD-10-CM | POA: Diagnosis not present

## 2020-07-29 MED FILL — VIT D2 1.25 MG (50,000 UNIT: 1.25 MG | 84 days supply | Qty: 12 | Fill #0

## 2020-07-29 NOTE — Progress Notes (Signed)
Pulmonary Individual Treatment Plan  Patient Details  Name: Rachel Fry MRN: [id number] Date of Birth: 1956-02-23 Referring Provider:     Pulmonary Rehab from 07/29/2020 in [facility] Cardiac and Pulmonary Rehab  Referring Provider Praveen      Initial Encounter Date:    Pulmonary Rehab from 07/29/2020 in [facility] Cardiac and Pulmonary Rehab  Date 07/29/20      Visit Diagnosis: Dyspnea, unspecified type  Patient's Home Medications on Admission:  Current Outpatient Medications:  .  albuterol (VENTOLIN HFA) 108 (90 Base) MCG/ACT inhaler, Inhale 2 puffs into the lungs every 4 (four) hours as needed for wheezing or shortness of breath. , Disp: , Rfl:  .  atorvastatin (LIPITOR) 20 MG tablet, TAKE 1 TABLET (20 MG TOTAL) BY MOUTH DAILY. (Patient taking differently: Take 20 mg by mouth at bedtime. ), Disp: 90 tablet, Rfl: 3 .  botulinum toxin Type A (BOTOX) 100 UNITS SOLR injection, Botox - Historical Medication  once every 12 weeks for migraines Active, Disp: , Rfl:  .  cetirizine (ZYRTEC) 10 MG tablet, Take 10 mg by mouth every evening. , Disp: , Rfl:  .  cloNIDine (CATAPRES) 0.1 MG tablet, Take 1 tablet (0.1 mg total) by mouth 2 (two) times daily., Disp: 60 tablet, Rfl:  .  EPINEPHrine 0.3 mg/0.3 mL IJ SOAJ injection, Inject 0.3 mg into the muscle as needed for anaphylaxis. , Disp: , Rfl: 1 .  Erenumab-aooe (AIMOVIG) 70 MG/ML SOAJ, Inject 70 mg into the skin every 30 (thirty) days. , Disp: , Rfl:  .  ezetimibe (ZETIA) 10 MG tablet, Take 10 mg by mouth every evening. , Disp: , Rfl:  .  fluticasone (FLOVENT HFA) 110 MCG/ACT inhaler, Inhale 2 puffs into the lungs 2 (two) times daily. , Disp: , Rfl:  .  frovatriptan (FROVA) 2.5 MG tablet, Take 2.5 mg by mouth 2 (two) times daily as needed for migraine. Max of 3 doses per 7 days, Disp: , Rfl:  .  furosemide (LASIX) 20 MG tablet, Take 40 mg by mouth daily as needed for edema. , Disp: , Rfl:  .  lansoprazole (PREVACID) 30 MG capsule, Take 30 mg by mouth  every evening. , Disp: , Rfl: 12 .  levothyroxine (SYNTHROID, LEVOTHROID) 50 MCG tablet, Take 50 mcg by mouth daily before breakfast. , Disp: , Rfl: 5 .  omalizumab (XOLAIR) 150 MG injection, Inject 300 mg into the skin every 28 (twenty-eight) days., Disp: 2 each, Rfl: 11 .  Polyethyl Glycol-Propyl Glycol (SYSTANE OP), Place 1-2 drops into both eyes 2 (two) times daily., Disp: , Rfl:  .  Rimegepant Sulfate (NURTEC) 75 MG TBDP, Take 75 mg by mouth daily as needed (migraines). Max 15 doses per 30 days, Disp: , Rfl:  .  rizatriptan (MAXALT) 10 MG tablet, Take 10 mg by mouth 2 (two) times daily as needed for migraine. Max doses of 3 doses per 7 days, Disp: , Rfl:  .  spironolactone (ALDACTONE) 25 MG tablet, Take 50 mg by mouth daily. , Disp: , Rfl:  .  triamcinolone (NASACORT ALLERGY 24HR) 55 MCG/ACT AERO nasal inhaler, Place 2 sprays into the nose at bedtime., Disp: , Rfl:  .  Vitamin D, Ergocalciferol, (DRISDOL) 1.25 MG (50000 UT) CAPS capsule, Take 50,000 Units by mouth every Sunday. , Disp: , Rfl: 3 .  zafirlukast (ACCOLATE) 10 MG tablet, Take 10 mg by mouth 2 (two) times daily. , Disp: , Rfl:  .  predniSONE (DELTASONE) 5 MG tablet, Take 5 mg by mouth  daily., Disp: , Rfl:   Current Facility-Administered Medications:  .  omalizumab Arvid Right) injection 300 mg, 300 mg, Subcutaneous, Q28 days, Valentina Shaggy, MD, 300 mg at 07/16/20 1819  Past Medical History: Past Medical History:  Diagnosis Date  . Allergy   . Asthma   . History of kidney stones   . Hypothyroidism   . Migraines   . Numbness and tingling    left side of body, occasionally right side   . S/P Botox injection     Tobacco Use: Social History   Tobacco Use  Smoking Status Never Smoker  Smokeless Tobacco Never Used    Labs: Recent Review Flowsheet Data    Labs for ITP Cardiac and Pulmonary Rehab Latest Ref Rng & Units 03/30/2014 07/24/2014 05/03/2015 12/10/2016 09/26/2019   Cholestrol 100 - 199 mg/dL 214(A) 177 - 201(H) -    LDLCALC 0 - 99 mg/dL 140 115(H) - 123(H) -   HDL >39 mg/dL 59 46 - 66 59   Trlycerides 0 - 149 mg/dL 74 81 - 61 116   Hemoglobin A1c 4.8 - 5.6 % 5.9 - 6.0(H) 5.4 -       Pulmonary Assessment Scores:  Pulmonary Assessment Scores    Row Name 07/29/20 1630         ADL UCSD   SOB Score total 45     Rest 1     Walk 3     Stairs 4     Bath 0     Dress 0     Shop 2       CAT Score   CAT Score 16       mMRC Score   mMRC Score 2            UCSD: Self-administered rating of dyspnea associated with activities of daily living (ADLs) 6-point scale (0 = "not at all" to 5 = "maximal or unable to do because of breathlessness")  Scoring Scores range from 0 to 120.  Minimally important difference is 5 units  CAT: CAT can identify the health impairment of COPD patients and is better correlated with disease progression.  CAT has a scoring range of zero to 40. The CAT score is classified into four groups of low (less than 10), medium (10 - 20), high (21-30) and very high (31-40) based on the impact level of disease on health status. A CAT score over 10 suggests significant symptoms.  A worsening CAT score could be explained by an exacerbation, poor medication adherence, poor inhaler technique, or progression of COPD or comorbid conditions.  CAT MCID is 2 points  mMRC: mMRC (Modified Medical Research Council) Dyspnea Scale is used to assess the degree of baseline functional disability in patients of respiratory disease due to dyspnea. No minimal important difference is established. A decrease in score of 1 point or greater is considered a positive change.   Pulmonary Function Assessment:  Pulmonary Function Assessment - 07/29/20 1138      Breath   Bilateral Breath Sounds Clear    Shortness of Breath Yes;Limiting activity           Exercise Target Goals: Exercise Program Goal: Individual exercise prescription set using results from initial 6 min walk test and THRR while  considering  patient's activity barriers and safety.   Exercise Prescription Goal: Initial exercise prescription builds to 30-45 minutes a day of aerobic activity, 2-3 days per week.  Home exercise guidelines will be given to patient during program as part  of exercise prescription that the participant will acknowledge.  Education: Aerobic Exercise & Resistance Training: - Gives group verbal and written instruction on the various components of exercise. Focuses on aerobic and resistive training programs and the benefits of this training and how to safely progress through these programs..   Education: Exercise & Equipment Safety: - Individual verbal instruction and demonstration of equipment use and safety with use of the equipment.   Pulmonary Rehab from 07/29/2020 in [facility] Cardiac and Pulmonary Rehab  Date 07/29/20  Educator [facility]  Instruction Review Code 1- Verbalizes Understanding      Education: Exercise Physiology & General Exercise Guidelines: - Group verbal and written instruction with models to review the exercise physiology of the cardiovascular system and associated critical values. Provides general exercise guidelines with specific guidelines to those with heart or lung disease.    Education: Flexibility, Balance, Mind/Body Relaxation: Provides group verbal/written instruction on the benefits of flexibility and balance training, including mind/body exercise modes such as yoga, pilates and tai chi.  Demonstration and skill practice provided.   Activity Barriers & Risk Stratification:  Activity Barriers & Cardiac Risk Stratification - 07/29/20 1152      Activity Barriers & Cardiac Risk Stratification   Activity Barriers Shortness of Breath;Balance Concerns;Chest Pain/Angina;Other (comment);Fibromyalgia    Comments Chest pain from Covid.    Cardiac Risk Stratification Low           6 Minute Walk:  6 Minute Walk    Row Name 07/29/20 1622         6 Minute Walk   Phase  Initial     Distance 1480 feet     Walk Time 6 minutes     # of Rest Breaks 0     MPH 2.8     METS 3.6     RPE 13     Perceived Dyspnea  2     VO2 Peak 12.6     Symptoms No     Resting HR 81 bpm     Resting BP 134/78     Resting Oxygen Saturation  97 %     Exercise Oxygen Saturation  during 6 min walk 95 %     Max Ex. HR 105 bpm     Max Ex. BP 154/84     2 Minute Post BP 136/84       Interval HR   1 Minute HR 96     2 Minute HR 97     3 Minute HR 102     4 Minute HR 101     5 Minute HR 105     6 Minute HR 104     2 Minute Post HR 88     Interval Heart Rate? Yes       Interval Oxygen   Interval Oxygen? Yes     Baseline Oxygen Saturation % 97 %     1 Minute Oxygen Saturation % 96 %     1 Minute Liters of Oxygen 0 L     2 Minute Oxygen Saturation % 96 %     2 Minute Liters of Oxygen 0 L     3 Minute Oxygen Saturation % 95 %     3 Minute Liters of Oxygen 0 L     4 Minute Oxygen Saturation % 96 %     4 Minute Liters of Oxygen 0 L     5 Minute Oxygen Saturation % 97 %     5  Minute Liters of Oxygen 0 L     6 Minute Oxygen Saturation % 94 %     6 Minute Liters of Oxygen 0 L     2 Minute Post Oxygen Saturation % 96 %     2 Minute Post Liters of Oxygen 0 L           Oxygen Initial Assessment:  Oxygen Initial Assessment - 07/29/20 1138      Home Oxygen   Home Oxygen Device None    Sleep Oxygen Prescription None    Home Exercise Oxygen Prescription None    Home Resting Oxygen Prescription None      Initial 6 min Walk   Oxygen Used None      Program Oxygen Prescription   Program Oxygen Prescription None      Intervention   Short Term Goals To learn and understand importance of monitoring SPO2 with pulse oximeter and demonstrate accurate use of the pulse oximeter.;To learn and understand importance of maintaining oxygen saturations>88%;To learn and demonstrate proper pursed lip breathing techniques or other breathing techniques.;To learn and demonstrate proper use  of respiratory medications    Long  Term Goals Verbalizes importance of monitoring SPO2 with pulse oximeter and return demonstration;Maintenance of O2 saturations>88%;Exhibits proper breathing techniques, such as pursed lip breathing or other method taught during program session;Compliance with respiratory medication;Demonstrates proper use of MDI's           Oxygen Re-Evaluation:   Oxygen Discharge (Final Oxygen Re-Evaluation):   Initial Exercise Prescription:  Initial Exercise Prescription - 07/29/20 1600      Date of Initial Exercise RX and Referring Provider   Date 07/29/20    Referring Provider Praveen      Treadmill   MPH 2.8    Grade 1    Minutes 15    METs 3.6      Recumbant Bike   Level 1    RPM 60    Minutes 15    METs 3.6      NuStep   Level 3    SPM 80    Minutes 15    METs 3.6      Prescription Details   Frequency (times per week) 3    Duration Progress to 30 minutes of continuous aerobic without signs/symptoms of physical distress      Intensity   THRR 40-80% of Max Heartrate 110-140    Ratings of Perceived Exertion 11-15    Perceived Dyspnea 0-4      Resistance Training   Training Prescription Yes    Weight 2 lb    Reps 10-15           Perform Capillary Blood Glucose checks as needed.  Exercise Prescription Changes:  Exercise Prescription Changes    Row Name 07/29/20 1600             Response to Exercise   Blood Pressure (Admit) 134/78       Blood Pressure (Exercise) 154/84       Blood Pressure (Exit) 136/84       Heart Rate (Admit) 81 bpm       Heart Rate (Exercise) 105 bpm       Heart Rate (Exit) 88 bpm       Oxygen Saturation (Admit) 97 %       Oxygen Saturation (Exercise) 94 %       Oxygen Saturation (Exit) 96 %       Rating of Perceived Exertion (Exercise) 13  Perceived Dyspnea (Exercise) 2              Exercise Comments:   Exercise Goals and Review:  Exercise Goals    Row Name 07/29/20 1613 07/29/20 1629            Exercise Goals   Increase Physical Activity Yes Yes      Intervention Provide advice, education, support and counseling about physical activity/exercise needs.;Develop an individualized exercise prescription for aerobic and resistive training based on initial evaluation findings, risk stratification, comorbidities and participant's personal goals. Provide advice, education, support and counseling about physical activity/exercise needs.;Develop an individualized exercise prescription for aerobic and resistive training based on initial evaluation findings, risk stratification, comorbidities and participant's personal goals.      Expected Outcomes Short Term: Attend rehab on a regular basis to increase amount of physical activity.;Long Term: Add in home exercise to make exercise part of routine and to increase amount of physical activity.;Long Term: Exercising regularly at least 3-5 days a week. Short Term: Attend rehab on a regular basis to increase amount of physical activity.;Long Term: Add in home exercise to make exercise part of routine and to increase amount of physical activity.;Long Term: Exercising regularly at least 3-5 days a week.      Increase Strength and Stamina Yes Yes      Intervention Provide advice, education, support and counseling about physical activity/exercise needs.;Develop an individualized exercise prescription for aerobic and resistive training based on initial evaluation findings, risk stratification, comorbidities and participant's personal goals. Provide advice, education, support and counseling about physical activity/exercise needs.;Develop an individualized exercise prescription for aerobic and resistive training based on initial evaluation findings, risk stratification, comorbidities and participant's personal goals.      Expected Outcomes Short Term: Increase workloads from initial exercise prescription for resistance, speed, and METs.;Short Term: Perform  resistance training exercises routinely during rehab and add in resistance training at home;Long Term: Improve cardiorespiratory fitness, muscular endurance and strength as measured by increased METs and functional capacity (6MWT) Short Term: Increase workloads from initial exercise prescription for resistance, speed, and METs.;Short Term: Perform resistance training exercises routinely during rehab and add in resistance training at home;Long Term: Improve cardiorespiratory fitness, muscular endurance and strength as measured by increased METs and functional capacity (6MWT)      Able to understand and use rate of perceived exertion (RPE) scale Yes Yes      Intervention Provide education and explanation on how to use RPE scale Provide education and explanation on how to use RPE scale      Expected Outcomes Short Term: Able to use RPE daily in rehab to express subjective intensity level;Long Term:  Able to use RPE to guide intensity level when exercising independently Short Term: Able to use RPE daily in rehab to express subjective intensity level;Long Term:  Able to use RPE to guide intensity level when exercising independently      Able to understand and use Dyspnea scale Yes Yes      Intervention Provide education and explanation on how to use Dyspnea scale Provide education and explanation on how to use Dyspnea scale      Expected Outcomes Short Term: Able to use Dyspnea scale daily in rehab to express subjective sense of shortness of breath during exertion;Long Term: Able to use Dyspnea scale to guide intensity level when exercising independently Short Term: Able to use Dyspnea scale daily in rehab to express subjective sense of shortness of breath during exertion;Long Term: Able to use  Dyspnea scale to guide intensity level when exercising independently      Knowledge and understanding of Target Heart Rate Range (THRR) Yes Yes      Intervention Provide education and explanation of THRR including how the  numbers were predicted and where they are located for reference Provide education and explanation of THRR including how the numbers were predicted and where they are located for reference      Expected Outcomes Short Term: Able to state/look up THRR;Short Term: Able to use daily as guideline for intensity in rehab;Long Term: Able to use THRR to govern intensity when exercising independently Short Term: Able to state/look up THRR;Short Term: Able to use daily as guideline for intensity in rehab;Long Term: Able to use THRR to govern intensity when exercising independently      Able to check pulse independently Yes Yes      Intervention Provide education and demonstration on how to check pulse in carotid and radial arteries.;Review the importance of being able to check your own pulse for safety during independent exercise Provide education and demonstration on how to check pulse in carotid and radial arteries.;Review the importance of being able to check your own pulse for safety during independent exercise      Expected Outcomes Short Term: Able to explain why pulse checking is important during independent exercise;Long Term: Able to check pulse independently and accurately Short Term: Able to explain why pulse checking is important during independent exercise;Long Term: Able to check pulse independently and accurately      Understanding of Exercise Prescription Yes Yes      Intervention Provide education, explanation, and written materials on patient's individual exercise prescription Provide education, explanation, and written materials on patient's individual exercise prescription      Expected Outcomes Short Term: Able to explain program exercise prescription;Long Term: Able to explain home exercise prescription to exercise independently Short Term: Able to explain program exercise prescription;Long Term: Able to explain home exercise prescription to exercise independently             Exercise Goals  Re-Evaluation :   Discharge Exercise Prescription (Final Exercise Prescription Changes):  Exercise Prescription Changes - 07/29/20 1600      Response to Exercise   Blood Pressure (Admit) 134/78    Blood Pressure (Exercise) 154/84    Blood Pressure (Exit) 136/84    Heart Rate (Admit) 81 bpm    Heart Rate (Exercise) 105 bpm    Heart Rate (Exit) 88 bpm    Oxygen Saturation (Admit) 97 %    Oxygen Saturation (Exercise) 94 %    Oxygen Saturation (Exit) 96 %    Rating of Perceived Exertion (Exercise) 13    Perceived Dyspnea (Exercise) 2           Nutrition:  Target Goals: Understanding of nutrition guidelines, daily intake of sodium <1581m, cholesterol <2013m calories 30% from fat and 7% or less from saturated fats, daily to have 5 or more servings of fruits and vegetables.  Education: Controlling Sodium/Reading Food Labels -Group verbal and written material supporting the discussion of sodium use in heart healthy nutrition. Review and explanation with models, verbal and written materials for utilization of the food label.   Education: General Nutrition Guidelines/Fats and Fiber: -Group instruction provided by verbal, written material, models and posters to present the general guidelines for heart healthy nutrition. Gives an explanation and review of dietary fats and fiber.   Biometrics:  Pre Biometrics - 07/29/20 1620  Pre Biometrics   Height '5\' 5"'  (1.651 m)    Weight 180 lb 3.2 oz (81.7 kg)    BMI (Calculated) 29.99    Single Leg Stand 11.63 seconds            Nutrition Therapy Plan and Nutrition Goals:   Nutrition Assessments:  Nutrition Assessments - 07/29/20 1631      MEDFICTS Scores   Pre Score 17           MEDIFICTS Score Key:          ?70 Need to make dietary changes          40-70 Heart Healthy Diet         ? 40 Therapeutic Level Cholesterol Diet  Nutrition Goals Re-Evaluation:   Nutrition Goals Discharge (Final Nutrition Goals  Re-Evaluation):   Psychosocial: Target Goals: Acknowledge presence or absence of significant depression and/or stress, maximize coping skills, provide positive support system. Participant is able to verbalize types and ability to use techniques and skills needed for reducing stress and depression.   Education: Depression - Provides group verbal and written instruction on the correlation between heart/lung disease and depressed mood, treatment options, and the stigmas associated with seeking treatment.   Education: Sleep Hygiene -Provides group verbal and written instruction about how sleep can affect your health.  Define sleep hygiene, discuss sleep cycles and impact of sleep habits. Review good sleep hygiene tips.    Education: Stress and Anxiety: - Provides group verbal and written instruction about the health risks of elevated stress and causes of high stress.  Discuss the correlation between heart/lung disease and anxiety and treatment options. Review healthy ways to manage with stress and anxiety.   Initial Review & Psychosocial Screening:  Initial Psych Review & Screening - 07/29/20 1141      Initial Review   Current issues with Current Stress Concerns    Source of Stress Concerns Unable to perform yard/household activities    Comments Since COVID has been ongoing she deals with the daily stress of that. She has had Covid and has been dealing with some side effects. She has been more short of breath recently and needs some conditioning to help with her shortness of breath. She feels optimisitc about her health and wants to get better with exercise.      Family Dynamics   Good Support System? Yes    Comments She can look to her husband, dog, three daughters and co-workers for support.      Barriers   Psychosocial barriers to participate in program The patient should benefit from training in stress management and relaxation.      Screening Interventions   Interventions To  provide support and resources with identified psychosocial needs;Provide feedback about the scores to participant;Encouraged to exercise    Expected Outcomes Short Term goal: Utilizing psychosocial counselor, staff and physician to assist with identification of specific Stressors or current issues interfering with healing process. Setting desired goal for each stressor or current issue identified.;Long Term Goal: Stressors or current issues are controlled or eliminated.;Short Term goal: Identification and review with participant of any Quality of Life or Depression concerns found by scoring the questionnaire.;Long Term goal: The participant improves quality of Life and PHQ9 Scores as seen by post scores and/or verbalization of changes           Quality of Life Scores:  Scores of 19 and below usually indicate a poorer quality of life in these areas.  A difference  of  2-3 points is a clinically meaningful difference.  A difference of 2-3 points in the total score of the Quality of Life Index has been associated with significant improvement in overall quality of life, self-image, physical symptoms, and general health in studies assessing change in quality of life.  PHQ-9: Recent Review Flowsheet Data    Depression screen [facility] 2/9 07/29/2020 02/08/2020 03/28/2015   Decreased Interest 0 0 0   Down, Depressed, Hopeless 0 0 0   PHQ - 2 Score 0 0 0   Altered sleeping 0 - -   Tired, decreased energy 3  - -   Change in appetite 0 - -   Feeling bad or failure about yourself  0 - -   Trouble concentrating 0 - -   Moving slowly or fidgety/restless 0 - -   Suicidal thoughts 0 - -   PHQ-9 Score 3 - -   Difficult doing work/chores Not difficult at all - -     Interpretation of Total Score  Total Score Depression Severity:  1-4 = Minimal depression, 5-9 = Mild depression, 10-14 = Moderate depression, 15-19 = Moderately severe depression, 20-27 = Severe depression   Psychosocial Evaluation and  Intervention:  Psychosocial Evaluation - 07/29/20 1147      Psychosocial Evaluation & Interventions   Interventions Encouraged to exercise with the program and follow exercise prescription;Stress management education;Relaxation education    Comments Since COVID has been ongoing she deals with the daily stress of that. She has had Covid and has been dealing with some side effects. She has been more short of breath recently and needs some conditioning to help with her shortness of breath. She feels optimisitc about her health and wants to get better with exercise.    Expected Outcomes Short: Attend [employer] to improve move. Long: maintain a positive outllok on health and graduate [employer].    Continue Psychosocial Services  Follow up required by staff           Psychosocial Re-Evaluation:   Psychosocial Discharge (Final Psychosocial Re-Evaluation):   Education: Education Goals: Education classes will be provided on a weekly basis, covering required topics. Participant will state understanding/return demonstration of topics presented.  Learning Barriers/Preferences:  Learning Barriers/Preferences - 07/29/20 1139      Learning Barriers/Preferences   Learning Barriers Sight    Learning Preferences None           General Pulmonary Education Topics:  Infection Prevention: - Provides verbal and written material to individual with discussion of infection control including proper hand washing and proper equipment cleaning during exercise session.   Pulmonary Rehab from 07/29/2020 in [facility] Cardiac and Pulmonary Rehab  Date 07/29/20  Educator [facility]  Instruction Review Code 1- Verbalizes Understanding      Falls Prevention: - Provides verbal and written material to individual with discussion of falls prevention and safety.   Pulmonary Rehab from 07/29/2020 in [facility] Cardiac and Pulmonary Rehab  Date 07/29/20  Educator [facility]  Instruction Review Code 1- Verbalizes Understanding       Chronic Lung Diseases: - Group verbal and written instruction to review updates, respiratory medications, advancements in procedures and treatments. Discuss use of supplemental oxygen including available portable oxygen systems, continuous and intermittent flow rates, concentrators, personal use and safety guidelines. Review proper use of inhaler and spacers. Provide informative websites for self-education.    Energy Conservation: - Provide group verbal and written instruction for methods to conserve energy, plan and organize activities. Instruct on pacing  techniques, use of adaptive equipment and posture/positioning to relieve shortness of breath.   Triggers and Exacerbations: - Group verbal and written instruction to review types of environmental triggers and ways to prevent exacerbations. Discuss weather changes, air quality and the benefits of nasal washing. Review warning signs and symptoms to help prevent infections. Discuss techniques for effective airway clearance, coughing, and vibrations.   AED/CPR: - Group verbal and written instruction with the use of models to demonstrate the basic use of the AED with the basic ABC's of resuscitation.   Anatomy and Physiology of the Lungs: - Group verbal and written instruction with the use of models to provide basic lung anatomy and physiology related to function, structure and complications of lung disease.   Anatomy & Physiology of the Heart: - Group verbal and written instruction and models provide basic cardiac anatomy and physiology, with the coronary electrical and arterial systems. Review of Valvular disease and Heart Failure   Cardiac Medications: - Group verbal and written instruction to review commonly prescribed medications for heart disease. Reviews the medication, class of the drug, and side effects.   Other: -Provides group and verbal instruction on various topics (see comments)   Knowledge Questionnaire Score:   Knowledge Questionnaire Score - 07/29/20 1631      Knowledge Questionnaire Score   Pre Score 18/18            Core Components/Risk Factors/Patient Goals at Admission:  Personal Goals and Risk Factors at Admission - 07/29/20 1634      Core Components/Risk Factors/Patient Goals on Admission    Weight Management Yes;Weight Loss    Intervention Weight Management: Develop a combined nutrition and exercise program designed to reach desired caloric intake, while maintaining appropriate intake of nutrient and fiber, sodium and fats, and appropriate energy expenditure required for the weight goal.;Weight Management: Provide education and appropriate resources to help participant work on and attain dietary goals.;Weight Management/Obesity: Establish reasonable short term and long term weight goals.    Admit Weight 180 lb 3.2 oz (81.7 kg)    Goal Weight: Short Term 175 lb (79.4 kg)    Goal Weight: Long Term 170 lb (77.1 kg)    Expected Outcomes Short Term: Continue to assess and modify interventions until short term weight is achieved;Long Term: Adherence to nutrition and physical activity/exercise program aimed toward attainment of established weight goal;Weight Maintenance: Understanding of the daily nutrition guidelines, which includes 25-35% calories from fat, 7% or less cal from saturated fats, less than 295m cholesterol, less than 1.5gm of sodium, & 5 or more servings of fruits and vegetables daily;Weight Loss: Understanding of general recommendations for a balanced deficit meal plan, which promotes 1-2 lb weight loss per week and includes a negative energy balance of [phone] kcal/d;Understanding recommendations for meals to include 15-35% energy as protein, 25-35% energy from fat, 35-60% energy from carbohydrates, less than 2051mof dietary cholesterol, 20-35 gm of total fiber daily;Understanding of distribution of calorie intake throughout the day with the consumption of 4-5 meals/snacks    Improve  shortness of breath with ADL's Yes    Intervention Provide education, individualized exercise plan and daily activity instruction to help decrease symptoms of SOB with activities of daily living.    Expected Outcomes Short Term: Improve cardiorespiratory fitness to achieve a reduction of symptoms when performing ADLs;Long Term: Be able to perform more ADLs without symptoms or delay the onset of symptoms    Hypertension Yes    Intervention Provide education on lifestyle modifcations including  regular physical activity/exercise, weight management, moderate sodium restriction and increased consumption of fresh fruit, vegetables, and low fat dairy, alcohol moderation, and smoking cessation.;Monitor prescription use compliance.    Expected Outcomes Short Term: Continued assessment and intervention until BP is < 140/72m HG in hypertensive participants. < 130/827mHG in hypertensive participants with diabetes, heart failure or chronic kidney disease.;Long Term: Maintenance of blood pressure at goal levels.    Lipids Yes    Intervention Provide education and support for participant on nutrition & aerobic/resistive exercise along with prescribed medications to achieve LDL <7029mHDL >41m12m  Expected Outcomes Short Term: Participant states understanding of desired cholesterol values and is compliant with medications prescribed. Participant is following exercise prescription and nutrition guidelines.;Long Term: Cholesterol controlled with medications as prescribed, with individualized exercise RX and with personalized nutrition plan. Value goals: LDL < 70mg75mL > 40 mg.           Education:Diabetes - Individual verbal and written instruction to review signs/symptoms of diabetes, desired ranges of glucose level fasting, after meals and with exercise. Acknowledge that pre and post exercise glucose checks will be done for 3 sessions at entry of program.   Education: Know Your Numbers and Risk  Factors: -Group verbal and written instruction about important numbers in your health.  Discussion of what are risk factors and how they play a role in the disease process.  Review of Cholesterol, Blood Pressure, Diabetes, and BMI and the role they play in your overall health.   Core Components/Risk Factors/Patient Goals Review:    Core Components/Risk Factors/Patient Goals at Discharge (Final Review):    ITP Comments:  ITP Comments    Row Name 07/29/20 1159 07/29/20 1634         ITP Comments In person Visit completed. Patient informed on EP and RD appointment and 6 Minute walk test. Patient also informed of patient health questionnaires on My Chart. Patient Verbalizes understanding. Visit diagnosis can be found in [facility]/2021. Completed 6MWT and gym orientation. Initial ITP created and sent for review to Dr. Mark Emily Filbertical Director.             Comments: initial ITP

## 2020-07-29 NOTE — Progress Notes (Signed)
In person Visit completed. Patient informed on EP and RD appointment and 6 Minute walk test. Patient also informed of patient health questionnaires on My Chart. Patient Verbalizes understanding. Visit diagnosis can be found in [facility] 07/19/2020.

## 2020-07-29 NOTE — Patient Instructions (Signed)
Patient Instructions  Patient Details  Name: Rachel Fry MRN: [id number] Date of Birth: 1955/11/02 Referring Provider:  Marshell Garfinkel, MD  Below are your personal goals for exercise, nutrition, and risk factors. Our goal is to help you stay on track towards obtaining and maintaining these goals. We will be discussing your progress on these goals with you throughout the program.  Initial Exercise Prescription:  Initial Exercise Prescription - 07/29/20 1600      Date of Initial Exercise RX and Referring Provider   Date 07/29/20    Referring Provider Praveen      Treadmill   MPH 2.8    Grade 1    Minutes 15    METs 3.6      Recumbant Bike   Level 1    RPM 60    Minutes 15    METs 3.6      NuStep   Level 3    SPM 80    Minutes 15    METs 3.6      Prescription Details   Frequency (times per week) 3    Duration Progress to 30 minutes of continuous aerobic without signs/symptoms of physical distress      Intensity   THRR 40-80% of Max Heartrate 110-140    Ratings of Perceived Exertion 11-15    Perceived Dyspnea 0-4      Resistance Training   Training Prescription Yes    Weight 2 lb    Reps 10-15           Exercise Goals: Frequency: Be able to perform aerobic exercise two to three times per week in program working toward 2-5 days per week of home exercise.  Intensity: Work with a perceived exertion of 11 (fairly light) - 15 (hard) while following your exercise prescription.  We will make changes to your prescription with you as you progress through the program.   Duration: Be able to do 30 to 45 minutes of continuous aerobic exercise in addition to a 5 minute warm-up and a 5 minute cool-down routine.   Nutrition Goals: Your personal nutrition goals will be established when you do your nutrition analysis with the dietician.  The following are general nutrition guidelines to follow: Cholesterol < 200mg /day Sodium < 1500mg /day Fiber: Women over 50 yrs - 21  grams per day  Personal Goals:  Personal Goals and Risk Factors at Admission - 07/29/20 1634      Core Components/Risk Factors/Patient Goals on Admission    Weight Management Yes;Weight Loss    Intervention Weight Management: Develop a combined nutrition and exercise program designed to reach desired caloric intake, while maintaining appropriate intake of nutrient and fiber, sodium and fats, and appropriate energy expenditure required for the weight goal.;Weight Management: Provide education and appropriate resources to help participant work on and attain dietary goals.;Weight Management/Obesity: Establish reasonable short term and long term weight goals.    Admit Weight 180 lb 3.2 oz (81.7 kg)    Goal Weight: Short Term 175 lb (79.4 kg)    Goal Weight: Long Term 170 lb (77.1 kg)    Expected Outcomes Short Term: Continue to assess and modify interventions until short term weight is achieved;Long Term: Adherence to nutrition and physical activity/exercise program aimed toward attainment of established weight goal;Weight Maintenance: Understanding of the daily nutrition guidelines, which includes 25-35% calories from fat, 7% or less cal from saturated fats, less than 200mg  cholesterol, less than 1.5gm of sodium, & 5 or more servings of fruits and vegetables  daily;Weight Loss: Understanding of general recommendations for a balanced deficit meal plan, which promotes 1-2 lb weight loss per week and includes a negative energy balance of [phone] kcal/d;Understanding recommendations for meals to include 15-35% energy as protein, 25-35% energy from fat, 35-60% energy from carbohydrates, less than 200mg  of dietary cholesterol, 20-35 gm of total fiber daily;Understanding of distribution of calorie intake throughout the day with the consumption of 4-5 meals/snacks    Improve shortness of breath with ADL's Yes    Intervention Provide education, individualized exercise plan and daily activity instruction to help  decrease symptoms of SOB with activities of daily living.    Expected Outcomes Short Term: Improve cardiorespiratory fitness to achieve a reduction of symptoms when performing ADLs;Long Term: Be able to perform more ADLs without symptoms or delay the onset of symptoms    Hypertension Yes    Intervention Provide education on lifestyle modifcations including regular physical activity/exercise, weight management, moderate sodium restriction and increased consumption of fresh fruit, vegetables, and low fat dairy, alcohol moderation, and smoking cessation.;Monitor prescription use compliance.    Expected Outcomes Short Term: Continued assessment and intervention until BP is < 140/66mm HG in hypertensive participants. < 130/41mm HG in hypertensive participants with diabetes, heart failure or chronic kidney disease.;Long Term: Maintenance of blood pressure at goal levels.    Lipids Yes    Intervention Provide education and support for participant on nutrition & aerobic/resistive exercise along with prescribed medications to achieve LDL 70mg , HDL >40mg .    Expected Outcomes Short Term: Participant states understanding of desired cholesterol values and is compliant with medications prescribed. Participant is following exercise prescription and nutrition guidelines.;Long Term: Cholesterol controlled with medications as prescribed, with individualized exercise RX and with personalized nutrition plan. Value goals: LDL < 70mg , HDL > 40 mg.           Tobacco Use Initial Evaluation: Social History   Tobacco Use  Smoking Status Never Smoker  Smokeless Tobacco Never Used    Exercise Goals and Review:  Exercise Goals    Row Name 07/29/20 1613 07/29/20 1629           Exercise Goals   Increase Physical Activity Yes Yes      Intervention Provide advice, education, support and counseling about physical activity/exercise needs.;Develop an individualized exercise prescription for aerobic and resistive  training based on initial evaluation findings, risk stratification, comorbidities and participant's personal goals. Provide advice, education, support and counseling about physical activity/exercise needs.;Develop an individualized exercise prescription for aerobic and resistive training based on initial evaluation findings, risk stratification, comorbidities and participant's personal goals.      Expected Outcomes Short Term: Attend rehab on a regular basis to increase amount of physical activity.;Long Term: Add in home exercise to make exercise part of routine and to increase amount of physical activity.;Long Term: Exercising regularly at least 3-5 days a week. Short Term: Attend rehab on a regular basis to increase amount of physical activity.;Long Term: Add in home exercise to make exercise part of routine and to increase amount of physical activity.;Long Term: Exercising regularly at least 3-5 days a week.      Increase Strength and Stamina Yes Yes      Intervention Provide advice, education, support and counseling about physical activity/exercise needs.;Develop an individualized exercise prescription for aerobic and resistive training based on initial evaluation findings, risk stratification, comorbidities and participant's personal goals. Provide advice, education, support and counseling about physical activity/exercise needs.;Develop an individualized exercise prescription for aerobic and resistive  training based on initial evaluation findings, risk stratification, comorbidities and participant's personal goals.      Expected Outcomes Short Term: Increase workloads from initial exercise prescription for resistance, speed, and METs.;Short Term: Perform resistance training exercises routinely during rehab and add in resistance training at home;Long Term: Improve cardiorespiratory fitness, muscular endurance and strength as measured by increased METs and functional capacity (6MWT) Short Term: Increase  workloads from initial exercise prescription for resistance, speed, and METs.;Short Term: Perform resistance training exercises routinely during rehab and add in resistance training at home;Long Term: Improve cardiorespiratory fitness, muscular endurance and strength as measured by increased METs and functional capacity (6MWT)      Able to understand and use rate of perceived exertion (RPE) scale Yes Yes      Intervention Provide education and explanation on how to use RPE scale Provide education and explanation on how to use RPE scale      Expected Outcomes Short Term: Able to use RPE daily in rehab to express subjective intensity level;Long Term:  Able to use RPE to guide intensity level when exercising independently Short Term: Able to use RPE daily in rehab to express subjective intensity level;Long Term:  Able to use RPE to guide intensity level when exercising independently      Able to understand and use Dyspnea scale Yes Yes      Intervention Provide education and explanation on how to use Dyspnea scale Provide education and explanation on how to use Dyspnea scale      Expected Outcomes Short Term: Able to use Dyspnea scale daily in rehab to express subjective sense of shortness of breath during exertion;Long Term: Able to use Dyspnea scale to guide intensity level when exercising independently Short Term: Able to use Dyspnea scale daily in rehab to express subjective sense of shortness of breath during exertion;Long Term: Able to use Dyspnea scale to guide intensity level when exercising independently      Knowledge and understanding of Target Heart Rate Range (THRR) Yes Yes      Intervention Provide education and explanation of THRR including how the numbers were predicted and where they are located for reference Provide education and explanation of THRR including how the numbers were predicted and where they are located for reference      Expected Outcomes Short Term: Able to state/look up  THRR;Short Term: Able to use daily as guideline for intensity in rehab;Long Term: Able to use THRR to govern intensity when exercising independently Short Term: Able to state/look up THRR;Short Term: Able to use daily as guideline for intensity in rehab;Long Term: Able to use THRR to govern intensity when exercising independently      Able to check pulse independently Yes Yes      Intervention Provide education and demonstration on how to check pulse in carotid and radial arteries.;Review the importance of being able to check your own pulse for safety during independent exercise Provide education and demonstration on how to check pulse in carotid and radial arteries.;Review the importance of being able to check your own pulse for safety during independent exercise      Expected Outcomes Short Term: Able to explain why pulse checking is important during independent exercise;Long Term: Able to check pulse independently and accurately Short Term: Able to explain why pulse checking is important during independent exercise;Long Term: Able to check pulse independently and accurately      Understanding of Exercise Prescription Yes Yes      Intervention Provide education, explanation,  and written materials on patient's individual exercise prescription Provide education, explanation, and written materials on patient's individual exercise prescription      Expected Outcomes Short Term: Able to explain program exercise prescription;Long Term: Able to explain home exercise prescription to exercise independently Short Term: Able to explain program exercise prescription;Long Term: Able to explain home exercise prescription to exercise independently             Copy of goals given to participant.

## 2020-07-31 ENCOUNTER — Encounter: Payer: Self-pay | Admitting: *Deleted

## 2020-07-31 DIAGNOSIS — R0600 Dyspnea, unspecified: Secondary | ICD-10-CM

## 2020-07-31 NOTE — Progress Notes (Signed)
Pulmonary Individual Treatment Plan  Patient Details  Name: Rachel Fry MRN: [id number] Date of Birth: 03/20/1956 Referring Provider:     Pulmonary Rehab from 07/29/2020 in [facility] Cardiac and Pulmonary Rehab  Referring Provider Praveen      Initial Encounter Date:    Pulmonary Rehab from 07/29/2020 in [facility] Cardiac and Pulmonary Rehab  Date 07/29/20      Visit Diagnosis: Dyspnea, unspecified type  Patient's Home Medications on Admission:  Current Outpatient Medications:  .  albuterol (VENTOLIN HFA) 108 (90 Base) MCG/ACT inhaler, Inhale 2 puffs into the lungs every 4 (four) hours as needed for wheezing or shortness of breath. , Disp: , Rfl:  .  atorvastatin (LIPITOR) 20 MG tablet, TAKE 1 TABLET (20 MG TOTAL) BY MOUTH DAILY. (Patient taking differently: Take 20 mg by mouth at bedtime. ), Disp: 90 tablet, Rfl: 3 .  botulinum toxin Type A (BOTOX) 100 UNITS SOLR injection, Botox - Historical Medication  once every 12 weeks for migraines Active, Disp: , Rfl:  .  cetirizine (ZYRTEC) 10 MG tablet, Take 10 mg by mouth every evening. , Disp: , Rfl:  .  cloNIDine (CATAPRES) 0.1 MG tablet, Take 1 tablet (0.1 mg total) by mouth 2 (two) times daily., Disp: 60 tablet, Rfl:  .  EPINEPHrine 0.3 mg/0.3 mL IJ SOAJ injection, Inject 0.3 mg into the muscle as needed for anaphylaxis. , Disp: , Rfl: 1 .  Erenumab-aooe (AIMOVIG) 70 MG/ML SOAJ, Inject 70 mg into the skin every 30 (thirty) days. , Disp: , Rfl:  .  ezetimibe (ZETIA) 10 MG tablet, Take 10 mg by mouth every evening. , Disp: , Rfl:  .  fluticasone (FLOVENT HFA) 110 MCG/ACT inhaler, Inhale 2 puffs into the lungs 2 (two) times daily. , Disp: , Rfl:  .  frovatriptan (FROVA) 2.5 MG tablet, Take 2.5 mg by mouth 2 (two) times daily as needed for migraine. Max of 3 doses per 7 days, Disp: , Rfl:  .  furosemide (LASIX) 20 MG tablet, Take 40 mg by mouth daily as needed for edema. , Disp: , Rfl:  .  lansoprazole (PREVACID) 30 MG capsule, Take 30 mg by mouth  every evening. , Disp: , Rfl: 12 .  levothyroxine (SYNTHROID, LEVOTHROID) 50 MCG tablet, Take 50 mcg by mouth daily before breakfast. , Disp: , Rfl: 5 .  omalizumab (XOLAIR) 150 MG injection, Inject 300 mg into the skin every 28 (twenty-eight) days., Disp: 2 each, Rfl: 11 .  Polyethyl Glycol-Propyl Glycol (SYSTANE OP), Place 1-2 drops into both eyes 2 (two) times daily., Disp: , Rfl:  .  predniSONE (DELTASONE) 5 MG tablet, Take 5 mg by mouth daily., Disp: , Rfl:  .  Rimegepant Sulfate (NURTEC) 75 MG TBDP, Take 75 mg by mouth daily as needed (migraines). Max 15 doses per 30 days, Disp: , Rfl:  .  rizatriptan (MAXALT) 10 MG tablet, Take 10 mg by mouth 2 (two) times daily as needed for migraine. Max doses of 3 doses per 7 days, Disp: , Rfl:  .  spironolactone (ALDACTONE) 25 MG tablet, Take 50 mg by mouth daily. , Disp: , Rfl:  .  triamcinolone (NASACORT ALLERGY 24HR) 55 MCG/ACT AERO nasal inhaler, Place 2 sprays into the nose at bedtime., Disp: , Rfl:  .  Vitamin D, Ergocalciferol, (DRISDOL) 1.25 MG (50000 UT) CAPS capsule, Take 50,000 Units by mouth every Sunday. , Disp: , Rfl: 3 .  zafirlukast (ACCOLATE) 10 MG tablet, Take 10 mg by mouth 2 (two) times daily. ,  Disp: , Rfl:   Current Facility-Administered Medications:  .  omalizumab Arvid Right) injection 300 mg, 300 mg, Subcutaneous, Q28 days, Valentina Shaggy, MD, 300 mg at 07/16/20 1819  Past Medical History: Past Medical History:  Diagnosis Date  . Allergy   . Asthma   . History of kidney stones   . Hypothyroidism   . Migraines   . Numbness and tingling    left side of body, occasionally right side   . S/P Botox injection     Tobacco Use: Social History   Tobacco Use  Smoking Status Never Smoker  Smokeless Tobacco Never Used    Labs: Recent Review Flowsheet Data    Labs for ITP Cardiac and Pulmonary Rehab Latest Ref Rng & Units 03/30/2014 07/24/2014 05/03/2015 12/10/2016 09/26/2019   Cholestrol 100 - 199 mg/dL 214(A) 177 - 201(H) -    LDLCALC 0 - 99 mg/dL 140 115(H) - 123(H) -   HDL >39 mg/dL 59 46 - 66 59   Trlycerides 0 - 149 mg/dL 74 81 - 61 116   Hemoglobin A1c 4.8 - 5.6 % 5.9 - 6.0(H) 5.4 -       Pulmonary Assessment Scores:  Pulmonary Assessment Scores    Row Name 07/29/20 1630         ADL UCSD   SOB Score total 45     Rest 1     Walk 3     Stairs 4     Bath 0     Dress 0     Shop 2       CAT Score   CAT Score 16       mMRC Score   mMRC Score 2            UCSD: Self-administered rating of dyspnea associated with activities of daily living (ADLs) 6-point scale (0 = "not at all" to 5 = "maximal or unable to do because of breathlessness")  Scoring Scores range from 0 to 120.  Minimally important difference is 5 units  CAT: CAT can identify the health impairment of COPD patients and is better correlated with disease progression.  CAT has a scoring range of zero to 40. The CAT score is classified into four groups of low (less than 10), medium (10 - 20), high (21-30) and very high (31-40) based on the impact level of disease on health status. A CAT score over 10 suggests significant symptoms.  A worsening CAT score could be explained by an exacerbation, poor medication adherence, poor inhaler technique, or progression of COPD or comorbid conditions.  CAT MCID is 2 points  mMRC: mMRC (Modified Medical Research Council) Dyspnea Scale is used to assess the degree of baseline functional disability in patients of respiratory disease due to dyspnea. No minimal important difference is established. A decrease in score of 1 point or greater is considered a positive change.   Pulmonary Function Assessment:  Pulmonary Function Assessment - 07/29/20 1138      Breath   Bilateral Breath Sounds Clear    Shortness of Breath Yes;Limiting activity           Exercise Target Goals: Exercise Program Goal: Individual exercise prescription set using results from initial 6 min walk test and THRR while  considering  patient's activity barriers and safety.   Exercise Prescription Goal: Initial exercise prescription builds to 30-45 minutes a day of aerobic activity, 2-3 days per week.  Home exercise guidelines will be given to patient during program as part of  exercise prescription that the participant will acknowledge.  Education: Aerobic Exercise & Resistance Training: - Gives group verbal and written instruction on the various components of exercise. Focuses on aerobic and resistive training programs and the benefits of this training and how to safely progress through these programs..   Education: Exercise & Equipment Safety: - Individual verbal instruction and demonstration of equipment use and safety with use of the equipment.   Pulmonary Rehab from 07/29/2020 in [facility] Cardiac and Pulmonary Rehab  Date 07/29/20  Educator [facility]  Instruction Review Code 1- Verbalizes Understanding      Education: Exercise Physiology & General Exercise Guidelines: - Group verbal and written instruction with models to review the exercise physiology of the cardiovascular system and associated critical values. Provides general exercise guidelines with specific guidelines to those with heart or lung disease.    Education: Flexibility, Balance, Mind/Body Relaxation: Provides group verbal/written instruction on the benefits of flexibility and balance training, including mind/body exercise modes such as yoga, pilates and tai chi.  Demonstration and skill practice provided.   Activity Barriers & Risk Stratification:  Activity Barriers & Cardiac Risk Stratification - 07/29/20 1152      Activity Barriers & Cardiac Risk Stratification   Activity Barriers Shortness of Breath;Balance Concerns;Chest Pain/Angina;Other (comment);Fibromyalgia    Comments Chest pain from Covid.    Cardiac Risk Stratification Low           6 Minute Walk:  6 Minute Walk    Row Name 07/29/20 1622         6 Minute Walk   Phase  Initial     Distance 1480 feet     Walk Time 6 minutes     # of Rest Breaks 0     MPH 2.8     METS 3.6     RPE 13     Perceived Dyspnea  2     VO2 Peak 12.6     Symptoms No     Resting HR 81 bpm     Resting BP 134/78     Resting Oxygen Saturation  97 %     Exercise Oxygen Saturation  during 6 min walk 95 %     Max Ex. HR 105 bpm     Max Ex. BP 154/84     2 Minute Post BP 136/84       Interval HR   1 Minute HR 96     2 Minute HR 97     3 Minute HR 102     4 Minute HR 101     5 Minute HR 105     6 Minute HR 104     2 Minute Post HR 88     Interval Heart Rate? Yes       Interval Oxygen   Interval Oxygen? Yes     Baseline Oxygen Saturation % 97 %     1 Minute Oxygen Saturation % 96 %     1 Minute Liters of Oxygen 0 L     2 Minute Oxygen Saturation % 96 %     2 Minute Liters of Oxygen 0 L     3 Minute Oxygen Saturation % 95 %     3 Minute Liters of Oxygen 0 L     4 Minute Oxygen Saturation % 96 %     4 Minute Liters of Oxygen 0 L     5 Minute Oxygen Saturation % 97 %     5 Minute  Liters of Oxygen 0 L     6 Minute Oxygen Saturation % 94 %     6 Minute Liters of Oxygen 0 L     2 Minute Post Oxygen Saturation % 96 %     2 Minute Post Liters of Oxygen 0 L           Oxygen Initial Assessment:  Oxygen Initial Assessment - 07/29/20 1138      Home Oxygen   Home Oxygen Device None    Sleep Oxygen Prescription None    Home Exercise Oxygen Prescription None    Home Resting Oxygen Prescription None      Initial 6 min Walk   Oxygen Used None      Program Oxygen Prescription   Program Oxygen Prescription None      Intervention   Short Term Goals To learn and understand importance of monitoring SPO2 with pulse oximeter and demonstrate accurate use of the pulse oximeter.;To learn and understand importance of maintaining oxygen saturations>88%;To learn and demonstrate proper pursed lip breathing techniques or other breathing techniques.;To learn and demonstrate proper use  of respiratory medications    Long  Term Goals Verbalizes importance of monitoring SPO2 with pulse oximeter and return demonstration;Maintenance of O2 saturations>88%;Exhibits proper breathing techniques, such as pursed lip breathing or other method taught during program session;Compliance with respiratory medication;Demonstrates proper use of MDI's           Oxygen Re-Evaluation:   Oxygen Discharge (Final Oxygen Re-Evaluation):   Initial Exercise Prescription:  Initial Exercise Prescription - 07/29/20 1600      Date of Initial Exercise RX and Referring Provider   Date 07/29/20    Referring Provider Praveen      Treadmill   MPH 2.8    Grade 1    Minutes 15    METs 3.6      Recumbant Bike   Level 1    RPM 60    Minutes 15    METs 3.6      NuStep   Level 3    SPM 80    Minutes 15    METs 3.6      Prescription Details   Frequency (times per week) 3    Duration Progress to 30 minutes of continuous aerobic without signs/symptoms of physical distress      Intensity   THRR 40-80% of Max Heartrate 110-140    Ratings of Perceived Exertion 11-15    Perceived Dyspnea 0-4      Resistance Training   Training Prescription Yes    Weight 2 lb    Reps 10-15           Perform Capillary Blood Glucose checks as needed.  Exercise Prescription Changes:  Exercise Prescription Changes    Row Name 07/29/20 1600             Response to Exercise   Blood Pressure (Admit) 134/78       Blood Pressure (Exercise) 154/84       Blood Pressure (Exit) 136/84       Heart Rate (Admit) 81 bpm       Heart Rate (Exercise) 105 bpm       Heart Rate (Exit) 88 bpm       Oxygen Saturation (Admit) 97 %       Oxygen Saturation (Exercise) 94 %       Oxygen Saturation (Exit) 96 %       Rating of Perceived Exertion (Exercise) 13  Perceived Dyspnea (Exercise) 2              Exercise Comments:   Exercise Goals and Review:  Exercise Goals    Row Name 07/29/20 1613 07/29/20 1629            Exercise Goals   Increase Physical Activity Yes Yes      Intervention Provide advice, education, support and counseling about physical activity/exercise needs.;Develop an individualized exercise prescription for aerobic and resistive training based on initial evaluation findings, risk stratification, comorbidities and participant's personal goals. Provide advice, education, support and counseling about physical activity/exercise needs.;Develop an individualized exercise prescription for aerobic and resistive training based on initial evaluation findings, risk stratification, comorbidities and participant's personal goals.      Expected Outcomes Short Term: Attend rehab on a regular basis to increase amount of physical activity.;Long Term: Add in home exercise to make exercise part of routine and to increase amount of physical activity.;Long Term: Exercising regularly at least 3-5 days a week. Short Term: Attend rehab on a regular basis to increase amount of physical activity.;Long Term: Add in home exercise to make exercise part of routine and to increase amount of physical activity.;Long Term: Exercising regularly at least 3-5 days a week.      Increase Strength and Stamina Yes Yes      Intervention Provide advice, education, support and counseling about physical activity/exercise needs.;Develop an individualized exercise prescription for aerobic and resistive training based on initial evaluation findings, risk stratification, comorbidities and participant's personal goals. Provide advice, education, support and counseling about physical activity/exercise needs.;Develop an individualized exercise prescription for aerobic and resistive training based on initial evaluation findings, risk stratification, comorbidities and participant's personal goals.      Expected Outcomes Short Term: Increase workloads from initial exercise prescription for resistance, speed, and METs.;Short Term: Perform  resistance training exercises routinely during rehab and add in resistance training at home;Long Term: Improve cardiorespiratory fitness, muscular endurance and strength as measured by increased METs and functional capacity (6MWT) Short Term: Increase workloads from initial exercise prescription for resistance, speed, and METs.;Short Term: Perform resistance training exercises routinely during rehab and add in resistance training at home;Long Term: Improve cardiorespiratory fitness, muscular endurance and strength as measured by increased METs and functional capacity (6MWT)      Able to understand and use rate of perceived exertion (RPE) scale Yes Yes      Intervention Provide education and explanation on how to use RPE scale Provide education and explanation on how to use RPE scale      Expected Outcomes Short Term: Able to use RPE daily in rehab to express subjective intensity level;Long Term:  Able to use RPE to guide intensity level when exercising independently Short Term: Able to use RPE daily in rehab to express subjective intensity level;Long Term:  Able to use RPE to guide intensity level when exercising independently      Able to understand and use Dyspnea scale Yes Yes      Intervention Provide education and explanation on how to use Dyspnea scale Provide education and explanation on how to use Dyspnea scale      Expected Outcomes Short Term: Able to use Dyspnea scale daily in rehab to express subjective sense of shortness of breath during exertion;Long Term: Able to use Dyspnea scale to guide intensity level when exercising independently Short Term: Able to use Dyspnea scale daily in rehab to express subjective sense of shortness of breath during exertion;Long Term: Able to use  Dyspnea scale to guide intensity level when exercising independently      Knowledge and understanding of Target Heart Rate Range (THRR) Yes Yes      Intervention Provide education and explanation of THRR including how the  numbers were predicted and where they are located for reference Provide education and explanation of THRR including how the numbers were predicted and where they are located for reference      Expected Outcomes Short Term: Able to state/look up THRR;Short Term: Able to use daily as guideline for intensity in rehab;Long Term: Able to use THRR to govern intensity when exercising independently Short Term: Able to state/look up THRR;Short Term: Able to use daily as guideline for intensity in rehab;Long Term: Able to use THRR to govern intensity when exercising independently      Able to check pulse independently Yes Yes      Intervention Provide education and demonstration on how to check pulse in carotid and radial arteries.;Review the importance of being able to check your own pulse for safety during independent exercise Provide education and demonstration on how to check pulse in carotid and radial arteries.;Review the importance of being able to check your own pulse for safety during independent exercise      Expected Outcomes Short Term: Able to explain why pulse checking is important during independent exercise;Long Term: Able to check pulse independently and accurately Short Term: Able to explain why pulse checking is important during independent exercise;Long Term: Able to check pulse independently and accurately      Understanding of Exercise Prescription Yes Yes      Intervention Provide education, explanation, and written materials on patient's individual exercise prescription Provide education, explanation, and written materials on patient's individual exercise prescription      Expected Outcomes Short Term: Able to explain program exercise prescription;Long Term: Able to explain home exercise prescription to exercise independently Short Term: Able to explain program exercise prescription;Long Term: Able to explain home exercise prescription to exercise independently             Exercise Goals  Re-Evaluation :   Discharge Exercise Prescription (Final Exercise Prescription Changes):  Exercise Prescription Changes - 07/29/20 1600      Response to Exercise   Blood Pressure (Admit) 134/78    Blood Pressure (Exercise) 154/84    Blood Pressure (Exit) 136/84    Heart Rate (Admit) 81 bpm    Heart Rate (Exercise) 105 bpm    Heart Rate (Exit) 88 bpm    Oxygen Saturation (Admit) 97 %    Oxygen Saturation (Exercise) 94 %    Oxygen Saturation (Exit) 96 %    Rating of Perceived Exertion (Exercise) 13    Perceived Dyspnea (Exercise) 2           Nutrition:  Target Goals: Understanding of nutrition guidelines, daily intake of sodium <1571m, cholesterol <2060m calories 30% from fat and 7% or less from saturated fats, daily to have 5 or more servings of fruits and vegetables.  Education: Controlling Sodium/Reading Food Labels -Group verbal and written material supporting the discussion of sodium use in heart healthy nutrition. Review and explanation with models, verbal and written materials for utilization of the food label.   Education: General Nutrition Guidelines/Fats and Fiber: -Group instruction provided by verbal, written material, models and posters to present the general guidelines for heart healthy nutrition. Gives an explanation and review of dietary fats and fiber.   Biometrics:  Pre Biometrics - 07/29/20 1620  Pre Biometrics   Height '5\' 5"'  (1.651 m)    Weight 180 lb 3.2 oz (81.7 kg)    BMI (Calculated) 29.99    Single Leg Stand 11.63 seconds            Nutrition Therapy Plan and Nutrition Goals:   Nutrition Assessments:  Nutrition Assessments - 07/29/20 1631      MEDFICTS Scores   Pre Score 17           MEDIFICTS Score Key:          ?70 Need to make dietary changes          40-70 Heart Healthy Diet         ? 40 Therapeutic Level Cholesterol Diet  Nutrition Goals Re-Evaluation:   Nutrition Goals Discharge (Final Nutrition Goals  Re-Evaluation):   Psychosocial: Target Goals: Acknowledge presence or absence of significant depression and/or stress, maximize coping skills, provide positive support system. Participant is able to verbalize types and ability to use techniques and skills needed for reducing stress and depression.   Education: Depression - Provides group verbal and written instruction on the correlation between heart/lung disease and depressed mood, treatment options, and the stigmas associated with seeking treatment.   Education: Sleep Hygiene -Provides group verbal and written instruction about how sleep can affect your health.  Define sleep hygiene, discuss sleep cycles and impact of sleep habits. Review good sleep hygiene tips.    Education: Stress and Anxiety: - Provides group verbal and written instruction about the health risks of elevated stress and causes of high stress.  Discuss the correlation between heart/lung disease and anxiety and treatment options. Review healthy ways to manage with stress and anxiety.   Initial Review & Psychosocial Screening:  Initial Psych Review & Screening - 07/29/20 1141      Initial Review   Current issues with Current Stress Concerns    Source of Stress Concerns Unable to perform yard/household activities    Comments Since COVID has been ongoing she deals with the daily stress of that. She has had Covid and has been dealing with some side effects. She has been more short of breath recently and needs some conditioning to help with her shortness of breath. She feels optimisitc about her health and wants to get better with exercise.      Family Dynamics   Good Support System? Yes    Comments She can look to her husband, dog, three daughters and co-workers for support.      Barriers   Psychosocial barriers to participate in program The patient should benefit from training in stress management and relaxation.      Screening Interventions   Interventions To  provide support and resources with identified psychosocial needs;Provide feedback about the scores to participant;Encouraged to exercise    Expected Outcomes Short Term goal: Utilizing psychosocial counselor, staff and physician to assist with identification of specific Stressors or current issues interfering with healing process. Setting desired goal for each stressor or current issue identified.;Long Term Goal: Stressors or current issues are controlled or eliminated.;Short Term goal: Identification and review with participant of any Quality of Life or Depression concerns found by scoring the questionnaire.;Long Term goal: The participant improves quality of Life and PHQ9 Scores as seen by post scores and/or verbalization of changes           Quality of Life Scores:  Scores of 19 and below usually indicate a poorer quality of life in these areas.  A difference  of  2-3 points is a clinically meaningful difference.  A difference of 2-3 points in the total score of the Quality of Life Index has been associated with significant improvement in overall quality of life, self-image, physical symptoms, and general health in studies assessing change in quality of life.  PHQ-9: Recent Review Flowsheet Data    Depression screen [facility] 2/9 07/29/2020 02/08/2020 03/28/2015   Decreased Interest 0 0 0   Down, Depressed, Hopeless 0 0 0   PHQ - 2 Score 0 0 0   Altered sleeping 0 - -   Tired, decreased energy 3  - -   Change in appetite 0 - -   Feeling bad or failure about yourself  0 - -   Trouble concentrating 0 - -   Moving slowly or fidgety/restless 0 - -   Suicidal thoughts 0 - -   PHQ-9 Score 3 - -   Difficult doing work/chores Not difficult at all - -     Interpretation of Total Score  Total Score Depression Severity:  1-4 = Minimal depression, 5-9 = Mild depression, 10-14 = Moderate depression, 15-19 = Moderately severe depression, 20-27 = Severe depression   Psychosocial Evaluation and  Intervention:  Psychosocial Evaluation - 07/29/20 1147      Psychosocial Evaluation & Interventions   Interventions Encouraged to exercise with the program and follow exercise prescription;Stress management education;Relaxation education    Comments Since COVID has been ongoing she deals with the daily stress of that. She has had Covid and has been dealing with some side effects. She has been more short of breath recently and needs some conditioning to help with her shortness of breath. She feels optimisitc about her health and wants to get better with exercise.    Expected Outcomes Short: Attend [facility] to improve move. Long: maintain a positive outllok on health and graduate [employer].    Continue Psychosocial Services  Follow up required by staff           Psychosocial Re-Evaluation:   Psychosocial Discharge (Final Psychosocial Re-Evaluation):   Education: Education Goals: Education classes will be provided on a weekly basis, covering required topics. Participant will state understanding/return demonstration of topics presented.  Learning Barriers/Preferences:  Learning Barriers/Preferences - 07/29/20 1139      Learning Barriers/Preferences   Learning Barriers Sight    Learning Preferences None           General Pulmonary Education Topics:  Infection Prevention: - Provides verbal and written material to individual with discussion of infection control including proper hand washing and proper equipment cleaning during exercise session.   Pulmonary Rehab from 07/29/2020 in [facility] Cardiac and Pulmonary Rehab  Date 07/29/20  Educator [facility]  Instruction Review Code 1- Verbalizes Understanding      Falls Prevention: - Provides verbal and written material to individual with discussion of falls prevention and safety.   Pulmonary Rehab from 07/29/2020 in [facility] Cardiac and Pulmonary Rehab  Date 07/29/20  Educator [facility]  Instruction Review Code 1- Verbalizes Understanding       Chronic Lung Diseases: - Group verbal and written instruction to review updates, respiratory medications, advancements in procedures and treatments. Discuss use of supplemental oxygen including available portable oxygen systems, continuous and intermittent flow rates, concentrators, personal use and safety guidelines. Review proper use of inhaler and spacers. Provide informative websites for self-education.    Energy Conservation: - Provide group verbal and written instruction for methods to conserve energy, plan and organize activities. Instruct on pacing  techniques, use of adaptive equipment and posture/positioning to relieve shortness of breath.   Triggers and Exacerbations: - Group verbal and written instruction to review types of environmental triggers and ways to prevent exacerbations. Discuss weather changes, air quality and the benefits of nasal washing. Review warning signs and symptoms to help prevent infections. Discuss techniques for effective airway clearance, coughing, and vibrations.   AED/CPR: - Group verbal and written instruction with the use of models to demonstrate the basic use of the AED with the basic ABC's of resuscitation.   Anatomy and Physiology of the Lungs: - Group verbal and written instruction with the use of models to provide basic lung anatomy and physiology related to function, structure and complications of lung disease.   Anatomy & Physiology of the Heart: - Group verbal and written instruction and models provide basic cardiac anatomy and physiology, with the coronary electrical and arterial systems. Review of Valvular disease and Heart Failure   Cardiac Medications: - Group verbal and written instruction to review commonly prescribed medications for heart disease. Reviews the medication, class of the drug, and side effects.   Other: -Provides group and verbal instruction on various topics (see comments)   Knowledge Questionnaire Score:   Knowledge Questionnaire Score - 07/29/20 1631      Knowledge Questionnaire Score   Pre Score 18/18            Core Components/Risk Factors/Patient Goals at Admission:  Personal Goals and Risk Factors at Admission - 07/29/20 1634      Core Components/Risk Factors/Patient Goals on Admission    Weight Management Yes;Weight Loss    Intervention Weight Management: Develop a combined nutrition and exercise program designed to reach desired caloric intake, while maintaining appropriate intake of nutrient and fiber, sodium and fats, and appropriate energy expenditure required for the weight goal.;Weight Management: Provide education and appropriate resources to help participant work on and attain dietary goals.;Weight Management/Obesity: Establish reasonable short term and long term weight goals.    Admit Weight 180 lb 3.2 oz (81.7 kg)    Goal Weight: Short Term 175 lb (79.4 kg)    Goal Weight: Long Term 170 lb (77.1 kg)    Expected Outcomes Short Term: Continue to assess and modify interventions until short term weight is achieved;Long Term: Adherence to nutrition and physical activity/exercise program aimed toward attainment of established weight goal;Weight Maintenance: Understanding of the daily nutrition guidelines, which includes 25-35% calories from fat, 7% or less cal from saturated fats, less than 248m cholesterol, less than 1.5gm of sodium, & 5 or more servings of fruits and vegetables daily;Weight Loss: Understanding of general recommendations for a balanced deficit meal plan, which promotes 1-2 lb weight loss per week and includes a negative energy balance of [phone] kcal/d;Understanding recommendations for meals to include 15-35% energy as protein, 25-35% energy from fat, 35-60% energy from carbohydrates, less than 2036mof dietary cholesterol, 20-35 gm of total fiber daily;Understanding of distribution of calorie intake throughout the day with the consumption of 4-5 meals/snacks    Improve  shortness of breath with ADL's Yes    Intervention Provide education, individualized exercise plan and daily activity instruction to help decrease symptoms of SOB with activities of daily living.    Expected Outcomes Short Term: Improve cardiorespiratory fitness to achieve a reduction of symptoms when performing ADLs;Long Term: Be able to perform more ADLs without symptoms or delay the onset of symptoms    Hypertension Yes    Intervention Provide education on lifestyle modifcations including  regular physical activity/exercise, weight management, moderate sodium restriction and increased consumption of fresh fruit, vegetables, and low fat dairy, alcohol moderation, and smoking cessation.;Monitor prescription use compliance.    Expected Outcomes Short Term: Continued assessment and intervention until BP is < 140/15m HG in hypertensive participants. < 130/880mHG in hypertensive participants with diabetes, heart failure or chronic kidney disease.;Long Term: Maintenance of blood pressure at goal levels.    Lipids Yes    Intervention Provide education and support for participant on nutrition & aerobic/resistive exercise along with prescribed medications to achieve LDL <7042mHDL >20m2m  Expected Outcomes Short Term: Participant states understanding of desired cholesterol values and is compliant with medications prescribed. Participant is following exercise prescription and nutrition guidelines.;Long Term: Cholesterol controlled with medications as prescribed, with individualized exercise RX and with personalized nutrition plan. Value goals: LDL < 70mg1mL > 40 mg.           Education:Diabetes - Individual verbal and written instruction to review signs/symptoms of diabetes, desired ranges of glucose level fasting, after meals and with exercise. Acknowledge that pre and post exercise glucose checks will be done for 3 sessions at entry of program.   Education: Know Your Numbers and Risk  Factors: -Group verbal and written instruction about important numbers in your health.  Discussion of what are risk factors and how they play a role in the disease process.  Review of Cholesterol, Blood Pressure, Diabetes, and BMI and the role they play in your overall health.   Core Components/Risk Factors/Patient Goals Review:    Core Components/Risk Factors/Patient Goals at Discharge (Final Review):    ITP Comments:  ITP Comments    Row Name 07/29/20 1159 07/29/20 1634 07/31/20 0733       ITP Comments In person Visit completed. Patient informed on EP and RD appointment and 6 Minute walk test. Patient also informed of patient health questionnaires on My Chart. Patient Verbalizes understanding. Visit diagnosis can be found in [facility]. Completed 6MWT and gym orientation. Initial ITP created and sent for review to Dr. Mark Emily Filbertical Director. 30 day review completed. ITP sent to Dr. Mark Emily Filbertical Director of Cardiac and Pulmonary Rehab. Continue with ITP unless changes are made by physician.  Patient is new to program.            Comments: 30 day review

## 2020-08-02 ENCOUNTER — Ambulatory Visit: Payer: 59

## 2020-08-05 DIAGNOSIS — R0600 Dyspnea, unspecified: Secondary | ICD-10-CM

## 2020-08-05 NOTE — Progress Notes (Unsigned)
Completed Initial RD Evaluation 

## 2020-08-06 MED FILL — XOLAIR 150 MG SOLR: 150 | 28 days supply | Qty: 2 | Fill #1

## 2020-08-07 ENCOUNTER — Other Ambulatory Visit: Payer: Self-pay

## 2020-08-07 ENCOUNTER — Encounter: Payer: 59 | Admitting: *Deleted

## 2020-08-07 DIAGNOSIS — R0600 Dyspnea, unspecified: Secondary | ICD-10-CM | POA: Diagnosis not present

## 2020-08-07 MED FILL — AIMOVIG 140 MG/ML SOAJ: 140 | 30 days supply | Qty: 1 | Fill #1

## 2020-08-07 NOTE — Progress Notes (Signed)
Daily Session Note  Patient Details  Name: Rachel Fry MRN: [id number] Date of Birth: 1956/07/05 Referring Provider:     Pulmonary Rehab from 07/29/2020 in [facility] Cardiac and Pulmonary Rehab  Referring Provider Praveen      Encounter Date: 08/07/2020  Check In:  Session Check In - 08/07/20 1615      Check-In   Supervising physician immediately available to respond to emergencies See telemetry face sheet for immediately available ER MD    Location [facility]-Cardiac & Pulmonary Rehab    Staff Present Renita Papa, RN BSN;Joseph Lou Miner, [place] Exercise Physiologist;Amanda Oletta Darter, [place], ACSM CEP, Exercise Physiologist    Virtual Visit No    Medication changes reported     No    Fall or balance concerns reported    No    Warm-up and Cool-down Performed on first and last piece of equipment    Resistance Training Performed Yes    VAD Patient? No    PAD/SET Patient? No      Pain Assessment   Currently in Pain? No/denies              Social History   Tobacco Use  Smoking Status Never Smoker  Smokeless Tobacco Never Used    Goals Met:  Independence with exercise equipment Exercise tolerated well No report of cardiac concerns or symptoms Strength training completed today  Goals Unmet:  Not Applicable  Comments: First full day of exercise!  Patient was oriented to gym and equipment including functions, settings, policies, and procedures.  Patient's individual exercise prescription and treatment plan were reviewed.  All starting workloads were established based on the results of the 6 minute walk test done at initial orientation visit.  The plan for exercise progression was also introduced and progression will be customized based on patient's performance and goals.     Dr. Emily Filbert is Medical Director for [place] and [facility].

## 2020-08-09 ENCOUNTER — Ambulatory Visit
Admission: RE | Admit: 2020-08-09 | Discharge: 2020-08-09 | Disposition: A | Discharge status: Home / Self Care | Payer: 59 | Source: Ambulatory Visit | Attending: Pulmonary Disease | Admitting: Pulmonary Disease

## 2020-08-09 ENCOUNTER — Other Ambulatory Visit: Payer: Self-pay

## 2020-08-09 DIAGNOSIS — R059 Cough, unspecified: Secondary | ICD-10-CM | POA: Diagnosis not present

## 2020-08-09 DIAGNOSIS — R0609 Other forms of dyspnea: Secondary | ICD-10-CM

## 2020-08-09 DIAGNOSIS — R0600 Dyspnea, unspecified: Secondary | ICD-10-CM | POA: Diagnosis not present

## 2020-08-12 ENCOUNTER — Encounter: Payer: Self-pay | Admitting: Neurology

## 2020-08-12 ENCOUNTER — Other Ambulatory Visit: Payer: Self-pay

## 2020-08-12 ENCOUNTER — Ambulatory Visit (INDEPENDENT_AMBULATORY_CARE_PROVIDER_SITE_OTHER): Payer: 59 | Admitting: Neurology

## 2020-08-12 VITALS — BP 121/74 | HR 74 | Ht 65.0 in | Wt 177.0 lb

## 2020-08-12 DIAGNOSIS — Z20822 Contact with and (suspected) exposure to covid-19: Secondary | ICD-10-CM | POA: Diagnosis not present

## 2020-08-12 DIAGNOSIS — B330 Epidemic myalgia: Secondary | ICD-10-CM | POA: Diagnosis not present

## 2020-08-12 DIAGNOSIS — R432 Parageusia: Secondary | ICD-10-CM

## 2020-08-12 DIAGNOSIS — R058 Other specified cough: Secondary | ICD-10-CM

## 2020-08-12 DIAGNOSIS — R430 Anosmia: Secondary | ICD-10-CM | POA: Diagnosis not present

## 2020-08-12 MED FILL — predniSONE 5 MG TABS: 5 | 30 days supply | Qty: 30 | Fill #1

## 2020-08-12 NOTE — Progress Notes (Signed)
Provider:  Larey Seat, M D  Referring Provider: Fenton Foy, NP Primary Care Physician:  Rachel Blade, MD  Chief Complaint  Patient presents with  . Follow-up    pt alone, rm 10. presents today with concerns having problems remain post covid. had covid year ago and she continues to have problems with taste and sometimes smell. c/o residual brain fog, fatigue and a rash that still present on body.     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 She is also followed by Dr Rachel Eagles, MD.   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  Portable pulse ox's were available for home use, she had a nonproductive cough.  She lost smell and taste sensation within the first week of illness.    She has had ongoing symptoms since including a rash, and abnormal feeling in her mouth as if her lips are puffy, she did see cardiology for shortness of breath had a work-up including echo and EKG.  She has seen dermatology for the rash evaluation.  She has a history of asthma and states that her shortness of breath has been ongoing unrelenting since she was diagnosed with Covid so this has been for a full year now.  Symptoms are not improving any further and she states that her oxygen sats have been staying stable but she feels short of breath and it is more severe after exertion.  She had a spirometry test at the primary care office this was supposedly normal results.  She is still having ongoing ageusia and anosmia.  The patient has followed up post Covid clinic since April of this  year, and upon her insistence she has finally seen on the above named specialists in referral, she had an CT of the chest with special attention to the lung given her symptoms of shortness of breath.  This reportedly was negative. Dr.  Ardis Hughs.   FINDINGS: Cardiovascular: Normal heart size. No significant pericardial effusion/thickening. Atherosclerotic nonaneurysmal thoracic aorta. Normal caliber pulmonary arteries.  Mediastinum/Nodes: No discrete thyroid nodules. Unremarkable esophagus. No pathologically enlarged axillary, mediastinal or hilar lymph nodes, noting limited sensitivity for the detection of hilar adenopathy on this noncontrast study.  Lungs/Pleura: No pneumothorax. No pleural effusion. No acute consolidative airspace disease, lung masses or significant pulmonary nodules. No significant air trapping or evidence of tracheobronchomalacia on the expiration sequence. No significant regions of subpleural reticulation, ground-glass attenuation, parenchymal banding, traction bronchiectasis, architectural distortion or frank honeycombing.  Upper abdomen: Small hiatal hernia.  Musculoskeletal: No aggressive appearing focal osseous lesions. Moderate thoracic spondylosis. Mild superior T10 vertebral compression fracture, chronic and unchanged since 04/19/2020 CT.  IMPRESSION: 1. No evidence of interstitial lung disease. No evidence of postinflammatory fibrosis. 2. Small hiatal hernia. 3. Chronic mild T10 vertebral compression fracture. 4. Aortic Atherosclerosis (ICD10-I70.0).   Electronically Signed By: [name] M.D. On: 08/09/2020 14:45   Review of Systems: Out of a complete 14 system review, the patient complains of only the following symptoms, and all other reviewed systems are negative.  SOB without infiltrate.  Plan is she is mostly felt impaired by a lack of taste sensation, she stated that the smell never seem to have completely gone away.  The measuring  today 5 different qualities of smell;  Peppermint-  Could smell but not taste the oil. Vanilla         - no taste or smell. Goes for florals.  Rum- Coffee          - no smell and no taste.  Coconut          -no smell or taste sensation reported. Citrus-             Smells something ( citrus!) and can taste like soap.    Social History   Socioeconomic History  . Marital status: Married    Spouse name: Rachel Fry  . Number of children: 3  . Years of education: 68  . Highest education level: Not on file  Occupational History  . Occupation: Cardiab rehab  Tobacco Use  . Smoking status: Never Smoker  . Smokeless tobacco: Never Used  Vaping Use  . Vaping Use: Never used  Substance and Sexual Activity  . Alcohol use: No  . Drug use: No  . Sexual activity: Not on file  Other Topics Concern  . Not on file  Social History Narrative   Lives with husband   Caffeine use: none   Left handed   Social Determinants of Health   Financial Resource Strain:   . Difficulty of Paying Living Expenses: Not on file  Food Insecurity:   . Worried About Charity fundraiser in the Last Year: Not on file  . Ran Out of Food in the Last Year: Not on file  Transportation Needs:   . Lack of Transportation (Medical): Not on file  . Lack of Transportation (Non-Medical): Not on file  Physical Activity:   . Days of Exercise per Week: Not on file  . Minutes of Exercise per Session: Not on file  Stress:   . Feeling of Stress : Not on file  Social Connections:   . Frequency of Communication with Friends and Family: Not on file  . Frequency of Social Gatherings with Friends and Family: Not on file  . Attends Religious Services: Not on file  . Active Member of Clubs or Organizations: Not on file  . Attends Archivist Meetings: Not on file  . Marital Status: Not on file  Intimate Partner Violence:   . Fear of Current or Ex-Partner: Not on file  . Emotionally Abused: Not on file  . Physically  Abused: Not on file  . Sexually Abused: Not on file    Family History  Problem Relation Age of Onset  . Hypertension Mother   . Breast cancer Mother 62  . Thyroid disease Mother   . Stroke Mother   . Dementia Mother   . Cardiomyopathy Father   . Peripheral Artery Disease Father   . Hyperlipidemia Brother   . Hypertension Brother   . Hypertension Brother   . Hyperlipidemia Brother   . Thyroid disease Brother   . Hypertension Brother   . Birth defects Brother   . Congenital heart disease Brother   . Cancer Brother        Kidney cancer  . Colon cancer Maternal Grandmother   . Stroke Paternal Grandfather   . Neuropathy Neg Hx   . Multiple sclerosis Neg Hx   . Migraines Neg Hx     Past Medical History:  Diagnosis Date  . Allergy   . Asthma   . History of kidney stones   . Hypothyroidism   . Migraines   . Numbness and tingling    left side of body, occasionally right side   . S/P Botox injection     Past Surgical History:  Procedure Laterality Date  . ARM NEUROPLASTY Right 1994 and 1996   for chronic pain  . CESAREAN SECTION    . CYSTOSCOPY W/ RETROGRADES Left 04/23/2020   Procedure: CYSTOSCOPY WITH RETROGRADE PYELOGRAM;  Surgeon: Abbie Sons, MD;  Location: [facility];  Service: Urology;  Laterality: Left;  . CYSTOSCOPY W/ RETROGRADES Left 05/13/2020   Procedure: CYSTOSCOPY WITH RETROGRADE PYELOGRAM;  Surgeon: Hollice Espy, MD;  Location: [facility];  Service: Urology;  Laterality: Left;  . CYSTOSCOPY WITH STENT PLACEMENT Left 04/23/2020   Procedure: CYSTOSCOPY WITH STENT PLACEMENT;  Surgeon: Abbie Sons, MD;  Location: [facility];  Service: Urology;  Laterality: Left;  . CYSTOSCOPY/URETEROSCOPY/HOLMIUM LASER/STENT PLACEMENT Left 05/13/2020   Procedure: CYSTOSCOPY/URETEROSCOPY/LASER/STENT EXCHANGE;  Surgeon: Hollice Espy, MD;  Location: [facility];  Service: Urology;  Laterality: Left;  . OVARIAN CYST REMOVAL  1973  . STONE EXTRACTION WITH BASKET  05/13/2020    Procedure: STONE EXTRACTION WITH BASKET;  Surgeon: Hollice Espy, MD;  Location: [facility];  Service: Urology;;  . TONSILLECTOMY AND ADENOIDECTOMY    . TUBAL LIGATION  1988    Current Outpatient Medications  Medication Sig Dispense Refill  . albuterol (VENTOLIN HFA) 108 (90 Base) MCG/ACT inhaler Inhale 2 puffs into the lungs every 4 (four) hours as needed for wheezing or shortness of breath.     Marland Kitchen atorvastatin (LIPITOR) 20 MG tablet TAKE 1 TABLET (20 MG TOTAL) BY MOUTH DAILY. (Patient taking differently: Take 20 mg by mouth at bedtime. ) 90 tablet 3  . botulinum toxin Type A (BOTOX) 100 UNITS SOLR injection Botox - Historical Medication  once every 12 weeks for migraines Active    . cetirizine (ZYRTEC) 10 MG tablet Take 10 mg by mouth every evening.     . cloNIDine (CATAPRES) 0.1 MG tablet Take 1 tablet (0.1 mg total) by mouth 2 (two) times daily. 60 tablet   . EPINEPHrine 0.3 mg/0.3 mL IJ SOAJ injection Inject 0.3 mg into the muscle as needed for anaphylaxis.   1  . Erenumab-aooe (AIMOVIG) 70 MG/ML SOAJ Inject 70 mg into the skin every 30 (thirty) days.     Marland Kitchen ezetimibe (ZETIA) 10 MG tablet Take 10 mg by mouth every evening.     . famotidine (PEPCID) 20 MG tablet Take 20 mg by mouth 2 (two) times daily.    . fluticasone (FLOVENT HFA) 110 MCG/ACT inhaler Inhale 2 puffs into the lungs 2 (two) times daily.     . frovatriptan (FROVA) 2.5 MG tablet Take 2.5 mg by mouth 2 (two) times daily as needed for migraine. Max of 3 doses per 7 days    . furosemide (LASIX) 20 MG tablet Take 40 mg by mouth daily as needed for edema.     . lansoprazole (PREVACID) 30 MG capsule Take 15 mg by mouth every evening.   12  . levothyroxine (SYNTHROID, LEVOTHROID) 50 MCG tablet Take 50 mcg by mouth daily before breakfast.   5  . omalizumab (XOLAIR) 150 MG injection Inject 300 mg into the skin every 28 (twenty-eight) days. 2 each 11  . Polyethyl Glycol-Propyl Glycol (SYSTANE OP) Place 1-2 drops into both eyes 2 (two)  times daily.    Marland Kitchen  predniSONE (DELTASONE) 5 MG tablet Take 5 mg by mouth daily.    . Rimegepant Sulfate (NURTEC) 75 MG TBDP Take 75 mg by mouth daily as needed (migraines). Max 15 doses per 30 days    . rizatriptan (MAXALT) 10 MG tablet Take 10 mg by mouth 2 (two) times daily as needed for migraine. Max doses of 3 doses per 7 days    . spironolactone (ALDACTONE) 25 MG tablet Take 50 mg by mouth daily.     Marland Kitchen triamcinolone (NASACORT ALLERGY 24HR) 55 MCG/ACT AERO nasal inhaler Place 2 sprays into the nose at bedtime.    . Vitamin D, Ergocalciferol, (DRISDOL) 1.25 MG (50000 UT) CAPS capsule Take 50,000 Units by mouth every Sunday.   3  . zafirlukast (ACCOLATE) 10 MG tablet Take 10 mg by mouth 2 (two) times daily.      Current Facility-Administered Medications  Medication Dose Route Frequency Provider Last Rate Last Admin  . omalizumab Arvid Right) injection 300 mg  300 mg Subcutaneous Q28 days Valentina Shaggy, MD   300 mg at 07/16/20 1819    Allergies as of 08/12/2020 - Review Complete 08/12/2020  Allergen Reaction Noted  . Flonase [fluticasone propionate] Other (See Comments)   . Latex Itching   . Fish oil Itching 02/25/2015  . Percocet [oxycodone-acetaminophen] Other (See Comments) 05/13/2020  . Sulfa antibiotics  02/25/2015  . Codeine Nausea And Vomiting and Rash 02/25/2015    Vitals: BP 121/74   Pulse 74   Ht 5\' 5"  (1.651 m)   Wt 177 lb (80.3 kg)   BMI 29.45 kg/m  Last Weight:  Wt Readings from Last 1 Encounters:  08/12/20 177 lb (80.3 kg)   Last Height:   Ht Readings from Last 1 Encounters:  08/12/20 5\' 5"  (1.651 m)    Physical exam:  General: The patient is awake, alert and appears not in acute distress. The patient is well groomed. Head: Normocephalic, atraumatic. Neck is supple.  Cardiovascular:  Regular rate and rhythm, without  murmurs or carotid bruit, and without distended neck veins. Respiratory: Lungs are clear to auscultation. Skin:  Without evidence of  edema, or rash Trunk: BMI is 29.45 and patient  has normal posture.  Neurologic exam : The patient is awake and alert, oriented to place and time.  Memory subjective described as impaired -  Trouble to remember employee number, ICD codes that are used all the time. Not so much names. e There is a normal attention span & concentration ability.  Speech is fluent without  dysarthria, dysphonia or aphasia. Mood and affect are appropriate.  Cranial nerves: Pupils are equal and briskly reactive to light. Funduscopic exam without evidence of pallor or edema. Extraocular movements  in vertical and horizontal planes intact and without nystagmus.  Visual fields by finger perimetry are intact. Hearing to finger rub intact.  Facial sensation intact to fine touch. Facial motor strength is symmetric and tongue and uvula move midline. Tongue protrusion into either cheek is normal. Shoulder shrug is normal.   Motor exam:   Normal tone ,muscle bulk and symmetric strength in all extremities.  Sensory:  Tingling in extremities and face. Fine touch, pinprick and vibration were tested in all extremities. Proprioception was normal.  Coordination: Rapid alternating movements in the fingers/hands were normal.  Finger-to-nose maneuver  normal without evidence of ataxia, dysmetria or tremor.  Gait and station: Patient walks without assistive device and is able unassisted to climb up to the exam table. Strength within normal limits. Stance  is stable and normal. Tandem gait is unfragmented. Deep tendon reflexes: in the  upper and lower extremities are symmetric and intact. Babinski maneuver response is  downgoing.   Assessment:  After physical and neurologic examination, review of laboratory studies, imaging, neurophysiology testing and pre-existing records, assessment is that of :   Subjective memory impairment since COVID.  Anosmia and partially ageusia. Some pargeusia.   Chronic SOB and cough , dry since COVID.    Myalgia , everywhere - has had this before. Touch hurts.   Vivid dreams - a commonly reported phenomenon in covid19 survivor.   Plan:  Treatment plan and additional workup :  [employer] smell school. Test your smell and taste in 5 categories.  Look at the image of origin of the flavour and repeat 2 times. Daily exercise , yoga and pilates, walking, swimming.anythng that stretches and decreases pain to touch.   reduce inflammation by using  " berries and vegetables" more colorful plate with less dairy gluten or meat.  This is not to be a strict diet but in full likely help to reduce inflammatory reactions in the body by using an anti-inflammatory diet.         Asencion Partridge Jeralyn Nolden MD 08/12/2020

## 2020-08-12 NOTE — Patient Instructions (Signed)
post covid symptoms for 12 month.

## 2020-08-13 ENCOUNTER — Ambulatory Visit (INDEPENDENT_AMBULATORY_CARE_PROVIDER_SITE_OTHER): Payer: 59 | Admitting: Pulmonary Disease

## 2020-08-13 ENCOUNTER — Ambulatory Visit (INDEPENDENT_AMBULATORY_CARE_PROVIDER_SITE_OTHER): Payer: 59

## 2020-08-13 ENCOUNTER — Other Ambulatory Visit: Payer: Self-pay

## 2020-08-13 DIAGNOSIS — R0609 Other forms of dyspnea: Secondary | ICD-10-CM

## 2020-08-13 DIAGNOSIS — L501 Idiopathic urticaria: Secondary | ICD-10-CM | POA: Diagnosis not present

## 2020-08-13 DIAGNOSIS — R0600 Dyspnea, unspecified: Secondary | ICD-10-CM | POA: Diagnosis not present

## 2020-08-13 LAB — PULMONARY FUNCTION TEST
DL/VA % pred: 126 %
DL/VA: 5.18 ml/min/mmHg/L
DLCO cor % pred: 108 %
DLCO cor: 23.87 ml/min/mmHg
DLCO unc % pred: 108 %
DLCO unc: 23.87 ml/min/mmHg
FEF 25-75 Post: 1.23 L/sec
FEF 25-75 Pre: 0.98 L/sec
FEF2575-%Change-Post: 25 %
FEF2575-%Pred-Post: 53 %
FEF2575-%Pred-Pre: 42 %
FEV1-%Change-Post: 7 %
FEV1-%Pred-Post: 68 %
FEV1-%Pred-Pre: 63 %
FEV1-Post: 1.85 L
FEV1-Pre: 1.72 L
FEV1FVC-%Change-Post: 3 %
FEV1FVC-%Pred-Pre: 85 %
FEV6-%Change-Post: 3 %
FEV6-%Pred-Post: 79 %
FEV6-%Pred-Pre: 76 %
FEV6-Post: 2.7 L
FEV6-Pre: 2.6 L
FEV6FVC-%Pred-Post: 103 %
FEV6FVC-%Pred-Pre: 103 %
FVC-%Change-Post: 3 %
FVC-%Pred-Post: 76 %
FVC-%Pred-Pre: 73 %
FVC-Post: 2.7 L
FVC-Pre: 2.6 L
Post FEV1/FVC ratio: 68 %
Post FEV6/FVC ratio: 100 %
Pre FEV1/FVC ratio: 66 %
Pre FEV6/FVC Ratio: 100 %
RV % pred: 95 %
RV: 2.15 L
TLC % pred: 91 %
TLC: 5.02 L

## 2020-08-13 NOTE — Progress Notes (Signed)
Full PFT performed today. °

## 2020-08-14 ENCOUNTER — Ambulatory Visit: Payer: 59 | Admitting: Neurology

## 2020-08-14 DIAGNOSIS — R0600 Dyspnea, unspecified: Secondary | ICD-10-CM

## 2020-08-14 NOTE — Progress Notes (Signed)
Daily Session Note  Patient Details  Name: Rachel Fry MRN: [id number] Date of Birth: August 19, 1956 Referring Provider:     Pulmonary Rehab from 07/29/2020 in [facility]  Referring Provider Praveen      Encounter Date: 08/14/2020  Check In:  Session Check In - 08/14/20 1629      Check-In   Supervising physician immediately available to respond to emergencies See telemetry face sheet for immediately available ER MD    Location [facility]    Staff Present Birdie Sons, MPA, RN;Meredith Sherryll Burger, RN BSN;Joseph Lou Miner, [place] Exercise Physiologist;Melissa Caiola RDN, LDN    Virtual Visit No    Medication changes reported     No    Fall or balance concerns reported    No    Warm-up and Cool-down Performed on first and last piece of equipment    Resistance Training Performed Yes    VAD Patient? No    PAD/SET Patient? No      Pain Assessment   Currently in Pain? No/denies              Social History   Tobacco Use  Smoking Status Never Smoker  Smokeless Tobacco Never Used    Goals Met:  Independence with exercise equipment Exercise tolerated well No report of cardiac concerns or symptoms Strength training completed today  Goals Unmet:  Not Applicable  Comments: Pt able to follow exercise prescription today without complaint.  Will continue to monitor for progression.   Dr. Emily Filbert is Medical Director for [place] and [facility].

## 2020-08-15 ENCOUNTER — Other Ambulatory Visit: Payer: Self-pay

## 2020-08-15 ENCOUNTER — Encounter: Payer: 59 | Admitting: *Deleted

## 2020-08-15 DIAGNOSIS — R0600 Dyspnea, unspecified: Secondary | ICD-10-CM

## 2020-08-15 MED FILL — ZAFIRLUKAST 10 MG TAB: 10 | 90 days supply | Qty: 180 | Fill #0

## 2020-08-15 NOTE — Progress Notes (Signed)
Daily Session Note  Patient Details  Name: JADEYN HARGETT MRN: [id number] Date of Birth: 10-20-1956 Referring Provider:     Pulmonary Rehab from 07/29/2020 in [facility]  Referring Provider Praveen      Encounter Date: 08/15/2020  Check In:  Session Check In - 08/15/20 1538      Check-In   Supervising physician immediately available to respond to emergencies See telemetry face sheet for immediately available ER MD    Location [facility]    Staff Present Justin Mend Lorre Nick, MA, RCEP, CCRP, CCET;Alaynna Kerwood Sherryll Burger, RN BSN    Virtual Visit No    Medication changes reported     No    Fall or balance concerns reported    No    Warm-up and Cool-down Performed on first and last piece of equipment    Resistance Training Performed Yes    VAD Patient? No    PAD/SET Patient? No      Pain Assessment   Currently in Pain? No/denies              Social History   Tobacco Use  Smoking Status Never Smoker  Smokeless Tobacco Never Used    Goals Met:  Independence with exercise equipment Exercise tolerated well No report of cardiac concerns or symptoms Strength training completed today  Goals Unmet:  Not Applicable  Comments: Pt able to follow exercise prescription today without complaint.  Will continue to monitor for progression.    Dr. Emily Filbert is Medical Director for [place].

## 2020-08-16 ENCOUNTER — Ambulatory Visit (INDEPENDENT_AMBULATORY_CARE_PROVIDER_SITE_OTHER): Payer: 59 | Admitting: Pulmonary Disease

## 2020-08-16 ENCOUNTER — Other Ambulatory Visit: Payer: Self-pay

## 2020-08-16 ENCOUNTER — Encounter: Payer: Self-pay | Admitting: Pulmonary Disease

## 2020-08-16 VITALS — BP 124/78 | HR 78 | Temp 97.1°F | Ht 65.0 in | Wt 178.8 lb

## 2020-08-16 DIAGNOSIS — Z8616 Personal history of COVID-19: Secondary | ICD-10-CM

## 2020-08-16 DIAGNOSIS — R0600 Dyspnea, unspecified: Secondary | ICD-10-CM | POA: Diagnosis not present

## 2020-08-16 DIAGNOSIS — R0609 Other forms of dyspnea: Secondary | ICD-10-CM

## 2020-08-16 LAB — CBC WITH DIFFERENTIAL/PLATELET
Basophils Absolute: 0 10*3/uL (ref 0.0–0.1)
Basophils Relative: 0.4 % (ref 0.0–3.0)
Eosinophils Absolute: 0 10*3/uL (ref 0.0–0.7)
Eosinophils Relative: 0.2 % (ref 0.0–5.0)
HCT: 43.1 % (ref 36.0–46.0)
Hemoglobin: 14.1 g/dL (ref 12.0–15.0)
Lymphocytes Relative: 27.1 % (ref 12.0–46.0)
Lymphs Abs: 2.8 10*3/uL (ref 0.7–4.0)
MCHC: 32.7 g/dL (ref 30.0–36.0)
MCV: 94.2 fl (ref 78.0–100.0)
Monocytes Absolute: 0.8 10*3/uL (ref 0.1–1.0)
Monocytes Relative: 8 % (ref 3.0–12.0)
Neutro Abs: 6.6 10*3/uL (ref 1.4–7.7)
Neutrophils Relative %: 64.3 % (ref 43.0–77.0)
Platelets: 235 10*3/uL (ref 150.0–400.0)
RBC: 4.58 Mil/uL (ref 3.87–5.11)
RDW: 14.3 % (ref 11.5–15.5)
WBC: 10.3 10*3/uL (ref 4.0–10.5)

## 2020-08-16 NOTE — Patient Instructions (Signed)
Your CT scan does not show significant abnormalities or any evidence of post Covid pneumonitis which is good news Lung function test-of her show obstruction which is likely from asthma Continue current inhalers  We will check labs today including CBC differential, IgE, alpha-1 antitrypsin levels and phenotype  Follow-up in 6 months.

## 2020-08-16 NOTE — Progress Notes (Signed)
Rachel Fry    [id number]    1956-07-21  Primary Care Physician:Shelton, Joelene Millin, MD  Referring Physician: [name], [place],   [id number]  Chief complaint: Follow-up for dyspnea, post COVID-34  HPI: 64 year old with history of asthma, idiopathic urticaria, hypertension, hyperlipidemia, allergies Diagnosed with COVID-19 in September 2020.  She did not require hospitalization.  Treated with antibiotics, steroids Continues to have significant dyspnea since then.  She is symptomatic with sitting and with exertion, occasional wheeze.  Denies any cough. She was given a sample of breztri by her primary care but had to stop due to hoarseness of voice.  She did not notice any difference with the inhaler  Follows with Dr. 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  No mold, hot tub, Jacuzzi.  No feather pillows or comforters Smoking history: Never smoker Travel history: No significant travel history Relevant family history: Brother had asthma  Interim history: She has started rehab at [facility] to have dyspnea on exertion which is unchanged from baseline  Here for review of CT and PFTs.   Outpatient Encounter Medications as of 08/16/2020  Medication Sig  . albuterol (VENTOLIN HFA) 108 (90 Base) MCG/ACT inhaler Inhale 2 puffs into the lungs every 4 (four) hours as needed for wheezing or shortness of breath.   Marland Kitchen atorvastatin (LIPITOR) 20 MG tablet TAKE 1 TABLET (20 MG TOTAL) BY MOUTH DAILY. (Patient taking differently: Take 20 mg by mouth at bedtime. )  . botulinum toxin Type A (BOTOX) 100 UNITS SOLR injection Botox - Historical Medication  once every 12 weeks for migraines Active  . cetirizine (ZYRTEC) 10 MG tablet Take 10 mg by mouth every  evening.   . cloNIDine (CATAPRES) 0.1 MG tablet Take 1 tablet (0.1 mg total) by mouth 2 (two) times daily.  Marland Kitchen EPINEPHrine 0.3 mg/0.3 mL IJ SOAJ injection Inject 0.3 mg into the muscle as needed for anaphylaxis.   Eduard Roux (AIMOVIG) 70 MG/ML SOAJ Inject 70 mg into the skin every 30 (thirty) days.   Marland Kitchen ezetimibe (ZETIA) 10 MG tablet Take 10 mg by mouth every evening.   . famotidine (PEPCID) 20 MG tablet Take 20 mg by mouth 2 (two) times daily.  . fluticasone (FLOVENT HFA) 110 MCG/ACT inhaler Inhale 2 puffs into the lungs 2 (two) times daily.   . frovatriptan (FROVA) 2.5 MG tablet Take 2.5 mg by mouth 2 (two) times daily as needed for migraine. Max of 3 doses per 7 days  . furosemide (LASIX) 20 MG tablet Take 40 mg by mouth daily as needed for edema.   . lansoprazole (PREVACID) 30 MG capsule Take 15 mg by mouth every evening.   Marland Kitchen levothyroxine (SYNTHROID, LEVOTHROID) 50 MCG tablet Take 50 mcg by mouth daily before breakfast.   . omalizumab (XOLAIR) 150 MG injection Inject 300 mg into the skin every 28 (twenty-eight) days.  Vladimir Faster Glycol-Propyl Glycol (SYSTANE OP) Place 1-2 drops into both eyes 2 (two) times daily.  . predniSONE (DELTASONE) 5 MG tablet Take 5 mg by mouth daily.  . Rimegepant Sulfate (NURTEC) 75 MG TBDP Take 75 mg by mouth daily as needed (migraines). Max 15 doses per 30 days  . rizatriptan (MAXALT) 10 MG tablet Take 10 mg by mouth 2 (two) times daily as  needed for migraine. Max doses of 3 doses per 7 days  . spironolactone (ALDACTONE) 25 MG tablet Take 50 mg by mouth daily.   Marland Kitchen triamcinolone (NASACORT ALLERGY 24HR) 55 MCG/ACT AERO nasal inhaler Place 2 sprays into the nose at bedtime.  . Vitamin D, Ergocalciferol, (DRISDOL) 1.25 MG (50000 UT) CAPS capsule Take 50,000 Units by mouth every Sunday.   . zafirlukast (ACCOLATE) 10 MG tablet Take 10 mg by mouth 2 (two) times daily.    Facility-Administered Encounter Medications as of 08/16/2020  Medication  . omalizumab  Arvid Right) injection 300 mg    Physical Exam: Blood pressure 124/78, pulse 78, temperature (!) 97.1 F (36.2 C), temperature source Skin, height 5\' 5"  (1.651 m), weight 178 lb 12.8 oz (81.1 kg), SpO2 97 %. Gen:      No acute distress HEENT:  EOMI, sclera anicteric Neck:     No masses; no thyromegaly Lungs:    Clear to auscultation bilaterally; normal respiratory effort CV:         Regular rate and rhythm; no murmurs Abd:      + bowel sounds; soft, non-tender; no palpable masses, no distension Ext:    No edema; adequate peripheral perfusion Skin:      Warm and dry; no rash Neuro: alert and oriented x 3 Psych: normal mood and affect  Data Reviewed: Imaging: Chest x-ray 02/14/2020-no acute cardiopulmonary abnormality CT renal stone 04/19/2020-visualized lung bases are clear High-res CT 08/09/2020-no evidence of interstitial lung disease, small hiatal hernia, T10 vertebral compression fracture. I have reviewed the images personally.  PFTs: 08/13/2020 FVC 2.70 [76%), FEV1 1.85 [60%], F/F 68, TLC 5.02 [91%], DLCO 23.87 [108%] Moderate obstruction   Labs:  Assessment:  Post COVID-19, long-haul syndrome Continues to have significant dyspnea, loss of taste, mild brain fog Also has utricairal rash which is thought to be related to COVID-19  High-res CT with no interstitial lung disease.  PFTs show obstruction but no restriction or diffusion impairment Suspect component of deconditioning.  Continue pulmonary rehab  Asthma Currently on Flovent and Nasacort She has not tolerated breztri and other inhalers with LABA in the past due to palpitations. Continue monitoring  Plan/Recommendations: Continue Flovent and Nasacort Pulmonary rehab.  Marshell Garfinkel MD [facility] 08/16/2020, 11:50 AM  CC: [name], MD

## 2020-08-19 ENCOUNTER — Other Ambulatory Visit: Payer: Self-pay

## 2020-08-19 DIAGNOSIS — R0600 Dyspnea, unspecified: Secondary | ICD-10-CM

## 2020-08-19 NOTE — Progress Notes (Signed)
Daily Session Note  Patient Details  Name: Rachel Fry MRN: [id number] Date of Birth: June 02, 1956 Referring Provider:     Pulmonary Rehab from 07/29/2020 in [facility] Cardiac and Pulmonary Rehab  Referring Provider Praveen      Encounter Date: 08/19/2020  Check In:  Session Check In - 08/19/20 1557      Check-In   Supervising physician immediately available to respond to emergencies See telemetry face sheet for immediately available ER MD    Location [facility]-Cardiac & Pulmonary Rehab    Staff Present Birdie Sons, MPA, Mauricia Area, BS, ACSM CEP, Exercise Physiologist;Meredith Sherryll Burger, RN Margurite Auerbach, MS Exercise Physiologist    Virtual Visit No    Medication changes reported     No    Fall or balance concerns reported    No    Warm-up and Cool-down Performed on first and last piece of equipment    Resistance Training Performed Yes    VAD Patient? No    PAD/SET Patient? No      Pain Assessment   Currently in Pain? No/denies              Social History   Tobacco Use  Smoking Status Never Smoker  Smokeless Tobacco Never Used    Goals Met:  Independence with exercise equipment Exercise tolerated well No report of cardiac concerns or symptoms Strength training completed today  Goals Unmet:  Not Applicable  Comments: Pt able to follow exercise prescription today without complaint.  Will continue to monitor for progression.   Dr. Emily Filbert is Medical Director for [place] and [facility].

## 2020-08-20 DIAGNOSIS — M5481 Occipital neuralgia: Secondary | ICD-10-CM | POA: Diagnosis not present

## 2020-08-20 DIAGNOSIS — M791 Myalgia, unspecified site: Secondary | ICD-10-CM | POA: Diagnosis not present

## 2020-08-20 DIAGNOSIS — M542 Cervicalgia: Secondary | ICD-10-CM | POA: Diagnosis not present

## 2020-08-20 NOTE — Telephone Encounter (Signed)
This indicates an allergic condition.  She has idiopathic utricaria which can cause an elevation in this level. Xolair treatment can also cause an elevation in IgE.

## 2020-08-20 NOTE — Telephone Encounter (Signed)
Dr. Vaughan Browner please advise on patient mychart message   This IGE looks elevated.  What does it measures?   IgE Order: [id number] Status:  Final result Visible to patient:  Yes (seen) Next appt:  08/22/2020 at 04:00 PM in Cardiac Rehabilitation ([place]) Dx:  Dyspnea on exertion  0 Result Notes  Ref Range & Units 4 d ago  IgE (Immunoglobulin E), Serum <OR=114 kU/L 1,557High   Resulting Agency  [facility]      Specimen Collected: 08/16/20 12:09 Last Resulted: 08/19/20 15:00     Lab Flowsheet   Order Details   View Encounter   Lab and Collection Details   Routing   Result History       Result Care Coordination  Patient Communication  Add Comments Seen Back to Top

## 2020-08-22 ENCOUNTER — Other Ambulatory Visit: Payer: Self-pay

## 2020-08-22 ENCOUNTER — Encounter: Payer: 59 | Admitting: *Deleted

## 2020-08-22 DIAGNOSIS — R0600 Dyspnea, unspecified: Secondary | ICD-10-CM | POA: Diagnosis not present

## 2020-08-22 LAB — ALPHA-1 ANTITRYPSIN PHENOTYPE: A-1 Antitrypsin, Ser: 126 mg/dL (ref 83–199)

## 2020-08-22 LAB — IGE: IgE (Immunoglobulin E), Serum: 1557 kU/L — ABNORMAL HIGH (ref <=114)

## 2020-08-22 NOTE — Progress Notes (Signed)
Daily Session Note  Patient Details  Name: Rachel Fry MRN: [id number] Date of Birth: 29-Jun-1956 Referring Provider:     Pulmonary Rehab from 07/29/2020 in [facility] Cardiac and Pulmonary Rehab  Referring Provider Praveen      Encounter Date: 08/22/2020  Check In:  Session Check In - 08/22/20 1404      Check-In   Supervising physician immediately available to respond to emergencies See telemetry face sheet for immediately available ER MD    Location [facility]-Cardiac & Pulmonary Rehab    Staff Present Renita Papa, RN BSN;Joseph [address] [place], [place], [place], CCRP, CCET    Virtual Visit No    Medication changes reported     No    Fall or balance concerns reported    No    Warm-up and Cool-down Performed on first and last piece of equipment    Resistance Training Performed Yes    VAD Patient? No    PAD/SET Patient? No      Pain Assessment   Currently in Pain? No/denies              Social History   Tobacco Use  Smoking Status Never Smoker  Smokeless Tobacco Never Used    Goals Met:  Independence with exercise equipment Exercise tolerated well No report of cardiac concerns or symptoms Strength training completed today  Goals Unmet:  Not Applicable  Comments: Pt able to follow exercise prescription today without complaint.  Will continue to monitor for progression.    Dr. Emily Filbert is Medical Director for [place] and [facility].

## 2020-08-26 ENCOUNTER — Other Ambulatory Visit: Payer: Self-pay

## 2020-08-26 ENCOUNTER — Other Ambulatory Visit (HOSPITAL_COMMUNITY): Payer: Self-pay | Admitting: Psychiatry

## 2020-08-26 ENCOUNTER — Encounter: Payer: 59 | Attending: Pulmonary Disease

## 2020-08-26 DIAGNOSIS — R0600 Dyspnea, unspecified: Secondary | ICD-10-CM | POA: Diagnosis not present

## 2020-08-26 MED FILL — NURTEC 75 MG TBDP: 75 | 30 days supply | Qty: 8 | Fill #4

## 2020-08-26 MED FILL — ATORVASTATIN CALCIUM 20 MG: 20 | 90 days supply | Qty: 90 | Fill #1

## 2020-08-26 MED FILL — FROVATRIPTAN SUCCINATE 2.5: 2.5 | 30 days supply | Qty: 18 | Fill #0

## 2020-08-26 NOTE — Progress Notes (Signed)
Daily Session Note  Patient Details  Name: Rachel Fry MRN: [id number] Date of Birth: August 29, 1956 Referring Provider:     Pulmonary Rehab from 07/29/2020 in [facility] Cardiac and Pulmonary Rehab  Referring Provider Praveen      Encounter Date: 08/26/2020  Check In:  Session Check In - 08/26/20 1612      Check-In   Supervising physician immediately available to respond to emergencies See telemetry face sheet for immediately available ER MD    Location [facility]-Cardiac & Pulmonary Rehab    Staff Present Birdie Sons, MPA, Mauricia Area, BS, ACSM CEP, Exercise Physiologist;Kara Eliezer Bottom, MS Exercise Physiologist    Virtual Visit No    Medication changes reported     No    Fall or balance concerns reported    No    Warm-up and Cool-down Performed on first and last piece of equipment    Resistance Training Performed Yes    VAD Patient? No    PAD/SET Patient? No      Pain Assessment   Currently in Pain? No/denies              Social History   Tobacco Use  Smoking Status Never Smoker  Smokeless Tobacco Never Used    Goals Met:  Independence with exercise equipment Exercise tolerated well No report of cardiac concerns or symptoms Strength training completed today  Goals Unmet:  Not Applicable  Comments: Pt able to follow exercise prescription today without complaint.  Will continue to monitor for progression.    Dr. Emily Filbert is Medical Director for [facility] and [facility].

## 2020-08-28 ENCOUNTER — Other Ambulatory Visit: Payer: Self-pay

## 2020-08-28 ENCOUNTER — Encounter: Payer: Self-pay | Admitting: *Deleted

## 2020-08-28 DIAGNOSIS — R0600 Dyspnea, unspecified: Secondary | ICD-10-CM | POA: Diagnosis not present

## 2020-08-28 NOTE — Progress Notes (Signed)
Pulmonary Individual Treatment Plan  Patient Details  Name: Rachel Fry MRN: [id number] Date of Birth: 21-Feb-1956 Referring Provider:     Pulmonary Rehab from 07/29/2020 in [facility] Cardiac and Pulmonary Rehab  Referring Provider Praveen      Initial Encounter Date:    Pulmonary Rehab from 07/29/2020 in [facility] Cardiac and Pulmonary Rehab  Date 07/29/20      Visit Diagnosis: Dyspnea, unspecified type  Patient's Home Medications on Admission:  Current Outpatient Medications:  .  albuterol (VENTOLIN HFA) 108 (90 Base) MCG/ACT inhaler, Inhale 2 puffs into the lungs every 4 (four) hours as needed for wheezing or shortness of breath. , Disp: , Rfl:  .  atorvastatin (LIPITOR) 20 MG tablet, TAKE 1 TABLET (20 MG TOTAL) BY MOUTH DAILY. (Patient taking differently: Take 20 mg by mouth at bedtime. ), Disp: 90 tablet, Rfl: 3 .  botulinum toxin Type A (BOTOX) 100 UNITS SOLR injection, Botox - Historical Medication  once every 12 weeks for migraines Active, Disp: , Rfl:  .  cetirizine (ZYRTEC) 10 MG tablet, Take 10 mg by mouth every evening. , Disp: , Rfl:  .  cloNIDine (CATAPRES) 0.1 MG tablet, Take 1 tablet (0.1 mg total) by mouth 2 (two) times daily., Disp: 60 tablet, Rfl:  .  EPINEPHrine 0.3 mg/0.3 mL IJ SOAJ injection, Inject 0.3 mg into the muscle as needed for anaphylaxis. , Disp: , Rfl: 1 .  Erenumab-aooe (AIMOVIG) 70 MG/ML SOAJ, Inject 70 mg into the skin every 30 (thirty) days. , Disp: , Rfl:  .  ezetimibe (ZETIA) 10 MG tablet, Take 10 mg by mouth every evening. , Disp: , Rfl:  .  famotidine (PEPCID) 20 MG tablet, Take 20 mg by mouth 2 (two) times daily., Disp: , Rfl:  .  fluticasone (FLOVENT HFA) 110 MCG/ACT inhaler, Inhale 2 puffs into the lungs 2 (two) times daily. , Disp: , Rfl:  .  frovatriptan (FROVA) 2.5 MG tablet, Take 2.5 mg by mouth 2 (two) times daily as needed for migraine. Max of 3 doses per 7 days, Disp: , Rfl:  .  furosemide (LASIX) 20 MG tablet, Take 40 mg by mouth daily as  needed for edema. , Disp: , Rfl:  .  lansoprazole (PREVACID) 30 MG capsule, Take 15 mg by mouth every evening. , Disp: , Rfl: 12 .  levothyroxine (SYNTHROID, LEVOTHROID) 50 MCG tablet, Take 50 mcg by mouth daily before breakfast. , Disp: , Rfl: 5 .  omalizumab (XOLAIR) 150 MG injection, Inject 300 mg into the skin every 28 (twenty-eight) days., Disp: 2 each, Rfl: 11 .  Polyethyl Glycol-Propyl Glycol (SYSTANE OP), Place 1-2 drops into both eyes 2 (two) times daily., Disp: , Rfl:  .  predniSONE (DELTASONE) 5 MG tablet, Take 5 mg by mouth daily., Disp: , Rfl:  .  Rimegepant Sulfate (NURTEC) 75 MG TBDP, Take 75 mg by mouth daily as needed (migraines). Max 15 doses per 30 days, Disp: , Rfl:  .  rizatriptan (MAXALT) 10 MG tablet, Take 10 mg by mouth 2 (two) times daily as needed for migraine. Max doses of 3 doses per 7 days, Disp: , Rfl:  .  spironolactone (ALDACTONE) 25 MG tablet, Take 50 mg by mouth daily. , Disp: , Rfl:  .  triamcinolone (NASACORT ALLERGY 24HR) 55 MCG/ACT AERO nasal inhaler, Place 2 sprays into the nose at bedtime., Disp: , Rfl:  .  Vitamin D, Ergocalciferol, (DRISDOL) 1.25 MG (50000 UT) CAPS capsule, Take 50,000 Units by mouth every Sunday. ,  Disp: , Rfl: 3 .  zafirlukast (ACCOLATE) 10 MG tablet, Take 10 mg by mouth 2 (two) times daily. , Disp: , Rfl:   Current Facility-Administered Medications:  .  omalizumab Arvid Right) injection 300 mg, 300 mg, Subcutaneous, Q28 days, Valentina Shaggy, MD, 300 mg at 08/13/20 1801  Past Medical History: Past Medical History:  Diagnosis Date  . Allergy   . Asthma   . History of kidney stones   . Hypothyroidism   . Migraines   . Numbness and tingling    left side of body, occasionally right side   . S/P Botox injection     Tobacco Use: Social History   Tobacco Use  Smoking Status Never Smoker  Smokeless Tobacco Never Used    Labs: Recent Review Flowsheet Data    Labs for ITP Cardiac and Pulmonary Rehab Latest Ref Rng & Units  03/30/2014 07/24/2014 05/03/2015 12/10/2016 09/26/2019   Cholestrol 100 - 199 mg/dL 214(A) 177 - 201(H) -   LDLCALC 0 - 99 mg/dL 140 115(H) - 123(H) -   HDL >39 mg/dL 59 46 - 66 59   Trlycerides 0 - 149 mg/dL 74 81 - 61 116   Hemoglobin A1c 4.8 - 5.6 % 5.9 - 6.0(H) 5.4 -       Pulmonary Assessment Scores:  Pulmonary Assessment Scores    Row Name 07/29/20 1630         ADL UCSD   SOB Score total 45     Rest 1     Walk 3     Stairs 4     Bath 0     Dress 0     Shop 2       CAT Score   CAT Score 16       mMRC Score   mMRC Score 2            UCSD: Self-administered rating of dyspnea associated with activities of daily living (ADLs) 6-point scale (0 = "not at all" to 5 = "maximal or unable to do because of breathlessness")  Scoring Scores range from 0 to 120.  Minimally important difference is 5 units  CAT: CAT can identify the health impairment of COPD patients and is better correlated with disease progression.  CAT has a scoring range of zero to 40. The CAT score is classified into four groups of low (less than 10), medium (10 - 20), high (21-30) and very high (31-40) based on the impact level of disease on health status. A CAT score over 10 suggests significant symptoms.  A worsening CAT score could be explained by an exacerbation, poor medication adherence, poor inhaler technique, or progression of COPD or comorbid conditions.  CAT MCID is 2 points  mMRC: mMRC (Modified Medical Research Council) Dyspnea Scale is used to assess the degree of baseline functional disability in patients of respiratory disease due to dyspnea. No minimal important difference is established. A decrease in score of 1 point or greater is considered a positive change.   Pulmonary Function Assessment:  Pulmonary Function Assessment - 07/29/20 1138      Breath   Bilateral Breath Sounds Clear    Shortness of Breath Yes;Limiting activity           Exercise Target Goals: Exercise Program  Goal: Individual exercise prescription set using results from initial 6 min walk test and THRR while considering  patient's activity barriers and safety.   Exercise Prescription Goal: Initial exercise prescription builds to 30-45 minutes a day  of aerobic activity, 2-3 days per week.  Home exercise guidelines will be given to patient during program as part of exercise prescription that the participant will acknowledge.  Education: Aerobic Exercise & Resistance Training: - Gives group verbal and written instruction on the various components of exercise. Focuses on aerobic and resistive training programs and the benefits of this training and how to safely progress through these programs..   Education: Exercise & Equipment Safety: - Individual verbal instruction and demonstration of equipment use and safety with use of the equipment.   Pulmonary Rehab from 07/29/2020 in [facility] Cardiac and Pulmonary Rehab  Date 07/29/20  Educator [facility]  Instruction Review Code 1- Verbalizes Understanding      Education: Exercise Physiology & General Exercise Guidelines: - Group verbal and written instruction with models to review the exercise physiology of the cardiovascular system and associated critical values. Provides general exercise guidelines with specific guidelines to those with heart or lung disease.    Education: Flexibility, Balance, Mind/Body Relaxation: Provides group verbal/written instruction on the benefits of flexibility and balance training, including mind/body exercise modes such as yoga, pilates and tai chi.  Demonstration and skill practice provided.   Activity Barriers & Risk Stratification:  Activity Barriers & Cardiac Risk Stratification - 07/29/20 1152      Activity Barriers & Cardiac Risk Stratification   Activity Barriers Shortness of Breath;Balance Concerns;Chest Pain/Angina;Other (comment);Fibromyalgia    Comments Chest pain from Covid.    Cardiac Risk Stratification Low            6 Minute Walk:  6 Minute Walk    Row Name 07/29/20 1622         6 Minute Walk   Phase Initial     Distance 1480 feet     Walk Time 6 minutes     # of Rest Breaks 0     MPH 2.8     METS 3.6     RPE 13     Perceived Dyspnea  2     VO2 Peak 12.6     Symptoms No     Resting HR 81 bpm     Resting BP 134/78     Resting Oxygen Saturation  97 %     Exercise Oxygen Saturation  during 6 min walk 95 %     Max Ex. HR 105 bpm     Max Ex. BP 154/84     2 Minute Post BP 136/84       Interval HR   1 Minute HR 96     2 Minute HR 97     3 Minute HR 102     4 Minute HR 101     5 Minute HR 105     6 Minute HR 104     2 Minute Post HR 88     Interval Heart Rate? Yes       Interval Oxygen   Interval Oxygen? Yes     Baseline Oxygen Saturation % 97 %     1 Minute Oxygen Saturation % 96 %     1 Minute Liters of Oxygen 0 L     2 Minute Oxygen Saturation % 96 %     2 Minute Liters of Oxygen 0 L     3 Minute Oxygen Saturation % 95 %     3 Minute Liters of Oxygen 0 L     4 Minute Oxygen Saturation % 96 %     4 Minute Liters  of Oxygen 0 L     5 Minute Oxygen Saturation % 97 %     5 Minute Liters of Oxygen 0 L     6 Minute Oxygen Saturation % 94 %     6 Minute Liters of Oxygen 0 L     2 Minute Post Oxygen Saturation % 96 %     2 Minute Post Liters of Oxygen 0 L           Oxygen Initial Assessment:  Oxygen Initial Assessment - 07/29/20 1138      Home Oxygen   Home Oxygen Device None    Sleep Oxygen Prescription None    Home Exercise Oxygen Prescription None    Home Resting Oxygen Prescription None      Initial 6 min Walk   Oxygen Used None      Program Oxygen Prescription   Program Oxygen Prescription None      Intervention   Short Term Goals To learn and understand importance of monitoring SPO2 with pulse oximeter and demonstrate accurate use of the pulse oximeter.;To learn and understand importance of maintaining oxygen saturations>88%;To learn and demonstrate proper  pursed lip breathing techniques or other breathing techniques.;To learn and demonstrate proper use of respiratory medications    Long  Term Goals Verbalizes importance of monitoring SPO2 with pulse oximeter and return demonstration;Maintenance of O2 saturations>88%;Exhibits proper breathing techniques, such as pursed lip breathing or other method taught during program session;Compliance with respiratory medication;Demonstrates proper use of MDI's           Oxygen Re-Evaluation:  Oxygen Re-Evaluation    Row Name 08/07/20 1620 08/22/20 1424           Program Oxygen Prescription   Program Oxygen Prescription None None        Home Oxygen   Home Oxygen Device None None      Sleep Oxygen Prescription None None      Home Exercise Oxygen Prescription None None      Home Resting Oxygen Prescription None None        Goals/Expected Outcomes   Short Term Goals To learn and understand importance of monitoring SPO2 with pulse oximeter and demonstrate accurate use of the pulse oximeter.;To learn and understand importance of maintaining oxygen saturations>88%;To learn and demonstrate proper pursed lip breathing techniques or other breathing techniques.;To learn and demonstrate proper use of respiratory medications To learn and understand importance of maintaining oxygen saturations>88%;To learn and understand importance of monitoring SPO2 with pulse oximeter and demonstrate accurate use of the pulse oximeter.;To learn and demonstrate proper use of respiratory medications      Long  Term Goals Verbalizes importance of monitoring SPO2 with pulse oximeter and return demonstration;Maintenance of O2 saturations>88%;Exhibits proper breathing techniques, such as pursed lip breathing or other method taught during program session;Compliance with respiratory medication;Demonstrates proper use of MDI's Maintenance of O2 saturations>88%;Verbalizes importance of monitoring SPO2 with pulse oximeter and return  demonstration;Demonstrates proper use of MDI's      Comments Reviewed PLB technique with pt.  Talked about how it works and it's importance in maintaining their exercise saturations. Rachel Fry states that she has been practicing PLB and has been using it. She uses her pulse oximeter at home and knows how to use it. She knows to keep her oxygen above 88 percent. She takes Flovent, Acolate, Zyrtec and Ventolin HFA as needed. She has no questions about her medications.      Goals/Expected Outcomes Short: Become more profiecient at using  PLB.   Long: Become independent at using PLB Short: continue compliance with her medications and program regimine. Long: maintain exercise in [facility] and use breathing techniques.             Oxygen Discharge (Final Oxygen Re-Evaluation):  Oxygen Re-Evaluation - 08/22/20 1424      Program Oxygen Prescription   Program Oxygen Prescription None      Home Oxygen   Home Oxygen Device None    Sleep Oxygen Prescription None    Home Exercise Oxygen Prescription None    Home Resting Oxygen Prescription None      Goals/Expected Outcomes   Short Term Goals To learn and understand importance of maintaining oxygen saturations>88%;To learn and understand importance of monitoring SPO2 with pulse oximeter and demonstrate accurate use of the pulse oximeter.;To learn and demonstrate proper use of respiratory medications    Long  Term Goals Maintenance of O2 saturations>88%;Verbalizes importance of monitoring SPO2 with pulse oximeter and return demonstration;Demonstrates proper use of MDI's    Comments Rachel Fry states that she has been practicing PLB and has been using it. She uses her pulse oximeter at home and knows how to use it. She knows to keep her oxygen above 88 percent. She takes Flovent, Acolate, Zyrtec and Ventolin HFA as needed. She has no questions about her medications.    Goals/Expected Outcomes Short: continue compliance with her medications and program regimine. Long:  maintain exercise in [facility] and use breathing techniques.           Initial Exercise Prescription:  Initial Exercise Prescription - 07/29/20 1600      Date of Initial Exercise RX and Referring Provider   Date 07/29/20    Referring Provider Praveen      Treadmill   MPH 2.8    Grade 1    Minutes 15    METs 3.6      Recumbant Bike   Level 1    RPM 60    Minutes 15    METs 3.6      NuStep   Level 3    SPM 80    Minutes 15    METs 3.6      Prescription Details   Frequency (times per week) 3    Duration Progress to 30 minutes of continuous aerobic without signs/symptoms of physical distress      Intensity   THRR 40-80% of Max Heartrate 110-140    Ratings of Perceived Exertion 11-15    Perceived Dyspnea 0-4      Resistance Training   Training Prescription Yes    Weight 2 lb    Reps 10-15           Perform Capillary Blood Glucose checks as needed.  Exercise Prescription Changes:  Exercise Prescription Changes    Row Name 07/29/20 1600 08/12/20 1000 08/27/20 1000         Response to Exercise   Blood Pressure (Admit) 134/78 132/82 130/58     Blood Pressure (Exercise) 154/84 162/80 146/64     Blood Pressure (Exit) 136/84 130/74 122/60     Heart Rate (Admit) 81 bpm 84 bpm 78 bpm     Heart Rate (Exercise) 105 bpm 101 bpm 109 bpm     Heart Rate (Exit) 88 bpm 84 bpm 87 bpm     Oxygen Saturation (Admit) 97 % 98 % 95 %     Oxygen Saturation (Exercise) 94 % 95 % 97 %     Oxygen Saturation (Exit) 96 %  94 % 98 %     Rating of Perceived Exertion (Exercise) '13 13 14     ' Perceived Dyspnea (Exercise) '2 2 3     ' Symptoms -- -- none     Comments -- first day exercise --     Duration -- -- Continue with 30 min of aerobic exercise without signs/symptoms of physical distress.     Intensity -- -- THRR unchanged       Progression   Progression -- -- Continue to progress workloads to maintain intensity without signs/symptoms of physical distress.     Average METs -- --  3.5       Resistance Training   Training Prescription -- -- Yes     Weight -- -- 2 lb     Reps -- -- 10-15       Treadmill   MPH -- -- 2.8     Grade -- -- 1     Minutes -- -- 15     METs -- -- 3.53       Recumbant Bike   Level -- -- 1.4     Minutes -- -- 15     METs -- -- 2.7       NuStep   Level -- -- 3     SPM -- -- 80     Minutes -- -- 15     METs -- -- 1.6            Exercise Comments:   Exercise Goals and Review:  Exercise Goals    Row Name 07/29/20 1613 07/29/20 1629           Exercise Goals   Increase Physical Activity Yes Yes      Intervention Provide advice, education, support and counseling about physical activity/exercise needs.;Develop an individualized exercise prescription for aerobic and resistive training based on initial evaluation findings, risk stratification, comorbidities and participant's personal goals. Provide advice, education, support and counseling about physical activity/exercise needs.;Develop an individualized exercise prescription for aerobic and resistive training based on initial evaluation findings, risk stratification, comorbidities and participant's personal goals.      Expected Outcomes Short Term: Attend rehab on a regular basis to increase amount of physical activity.;Long Term: Add in home exercise to make exercise part of routine and to increase amount of physical activity.;Long Term: Exercising regularly at least 3-5 days a week. Short Term: Attend rehab on a regular basis to increase amount of physical activity.;Long Term: Add in home exercise to make exercise part of routine and to increase amount of physical activity.;Long Term: Exercising regularly at least 3-5 days a week.      Increase Strength and Stamina Yes Yes      Intervention Provide advice, education, support and counseling about physical activity/exercise needs.;Develop an individualized exercise prescription for aerobic and resistive training based on initial  evaluation findings, risk stratification, comorbidities and participant's personal goals. Provide advice, education, support and counseling about physical activity/exercise needs.;Develop an individualized exercise prescription for aerobic and resistive training based on initial evaluation findings, risk stratification, comorbidities and participant's personal goals.      Expected Outcomes Short Term: Increase workloads from initial exercise prescription for resistance, speed, and METs.;Short Term: Perform resistance training exercises routinely during rehab and add in resistance training at home;Long Term: Improve cardiorespiratory fitness, muscular endurance and strength as measured by increased METs and functional capacity (6MWT) Short Term: Increase workloads from initial exercise prescription for resistance, speed, and METs.;Short Term: Perform resistance training exercises routinely  during rehab and add in resistance training at home;Long Term: Improve cardiorespiratory fitness, muscular endurance and strength as measured by increased METs and functional capacity (6MWT)      Able to understand and use rate of perceived exertion (RPE) scale Yes Yes      Intervention Provide education and explanation on how to use RPE scale Provide education and explanation on how to use RPE scale      Expected Outcomes Short Term: Able to use RPE daily in rehab to express subjective intensity level;Long Term:  Able to use RPE to guide intensity level when exercising independently Short Term: Able to use RPE daily in rehab to express subjective intensity level;Long Term:  Able to use RPE to guide intensity level when exercising independently      Able to understand and use Dyspnea scale Yes Yes      Intervention Provide education and explanation on how to use Dyspnea scale Provide education and explanation on how to use Dyspnea scale      Expected Outcomes Short Term: Able to use Dyspnea scale daily in rehab to express  subjective sense of shortness of breath during exertion;Long Term: Able to use Dyspnea scale to guide intensity level when exercising independently Short Term: Able to use Dyspnea scale daily in rehab to express subjective sense of shortness of breath during exertion;Long Term: Able to use Dyspnea scale to guide intensity level when exercising independently      Knowledge and understanding of Target Heart Rate Range (THRR) Yes Yes      Intervention Provide education and explanation of THRR including how the numbers were predicted and where they are located for reference Provide education and explanation of THRR including how the numbers were predicted and where they are located for reference      Expected Outcomes Short Term: Able to state/look up THRR;Short Term: Able to use daily as guideline for intensity in rehab;Long Term: Able to use THRR to govern intensity when exercising independently Short Term: Able to state/look up THRR;Short Term: Able to use daily as guideline for intensity in rehab;Long Term: Able to use THRR to govern intensity when exercising independently      Able to check pulse independently Yes Yes      Intervention Provide education and demonstration on how to check pulse in carotid and radial arteries.;Review the importance of being able to check your own pulse for safety during independent exercise Provide education and demonstration on how to check pulse in carotid and radial arteries.;Review the importance of being able to check your own pulse for safety during independent exercise      Expected Outcomes Short Term: Able to explain why pulse checking is important during independent exercise;Long Term: Able to check pulse independently and accurately Short Term: Able to explain why pulse checking is important during independent exercise;Long Term: Able to check pulse independently and accurately      Understanding of Exercise Prescription Yes Yes      Intervention Provide education,  explanation, and written materials on patient's individual exercise prescription Provide education, explanation, and written materials on patient's individual exercise prescription      Expected Outcomes Short Term: Able to explain program exercise prescription;Long Term: Able to explain home exercise prescription to exercise independently Short Term: Able to explain program exercise prescription;Long Term: Able to explain home exercise prescription to exercise independently             Exercise Goals Re-Evaluation :  Exercise Goals Re-Evaluation  Tracy Name 08/07/20 1622 08/27/20 1031           Exercise Goal Re-Evaluation   Exercise Goals Review Increase Physical Activity;Able to understand and use rate of perceived exertion (RPE) scale;Knowledge and understanding of Target Heart Rate Range (THRR);Understanding of Exercise Prescription;Increase Strength and Stamina;Able to understand and use Dyspnea scale;Able to check pulse independently Increase Physical Activity;Able to understand and use rate of perceived exertion (RPE) scale;Knowledge and understanding of Target Heart Rate Range (THRR);Understanding of Exercise Prescription;Increase Strength and Stamina;Able to understand and use Dyspnea scale;Able to check pulse independently      Comments Reviewed RPE and dyspnea scales, THR and program prescription with pt today.  Pt voiced understanding and was given a copy of goals to take home. Rachel Fry is doing well in rehab and adjusting well to the machines. Her oxygen levels are maintained well during exercise. She is still experiencing some SOB and will take breaks as needed. Will use PLB technique when she needs to. Will continue to monitor.      Expected Outcomes Short: Use RPE daily to regulate intensity. Long: Follow program prescription in THR. Short:  Increase speed on TM Long: Continue to progress overall MET level             Discharge Exercise Prescription (Final Exercise Prescription  Changes):  Exercise Prescription Changes - 08/27/20 1000      Response to Exercise   Blood Pressure (Admit) 130/58    Blood Pressure (Exercise) 146/64    Blood Pressure (Exit) 122/60    Heart Rate (Admit) 78 bpm    Heart Rate (Exercise) 109 bpm    Heart Rate (Exit) 87 bpm    Oxygen Saturation (Admit) 95 %    Oxygen Saturation (Exercise) 97 %    Oxygen Saturation (Exit) 98 %    Rating of Perceived Exertion (Exercise) 14    Perceived Dyspnea (Exercise) 3    Symptoms none    Duration Continue with 30 min of aerobic exercise without signs/symptoms of physical distress.    Intensity THRR unchanged      Progression   Progression Continue to progress workloads to maintain intensity without signs/symptoms of physical distress.    Average METs 3.5      Resistance Training   Training Prescription Yes    Weight 2 lb    Reps 10-15      Treadmill   MPH 2.8    Grade 1    Minutes 15    METs 3.53      Recumbant Bike   Level 1.4    Minutes 15    METs 2.7      NuStep   Level 3    SPM 80    Minutes 15    METs 1.6           Nutrition:  Target Goals: Understanding of nutrition guidelines, daily intake of sodium <1549m, cholesterol <203m calories 30% from fat and 7% or less from saturated fats, daily to have 5 or more servings of fruits and vegetables.  Education: Controlling Sodium/Reading Food Labels -Group verbal and written material supporting the discussion of sodium use in heart healthy nutrition. Review and explanation with models, verbal and written materials for utilization of the food label.   Education: General Nutrition Guidelines/Fats and Fiber: -Group instruction provided by verbal, written material, models and posters to present the general guidelines for heart healthy nutrition. Gives an explanation and review of dietary fats and fiber.   Biometrics:  Pre Biometrics -  07/29/20 1620      Pre Biometrics   Height '5\' 5"'  (1.651 m)    Weight 180 lb 3.2 oz (81.7  kg)    BMI (Calculated) 29.99    Single Leg Stand 11.63 seconds            Nutrition Therapy Plan and Nutrition Goals:  Nutrition Therapy & Goals - 08/05/20 1313      Nutrition Therapy   Diet Heart healthy, low Na, Pulmonary MNT    Drug/Food Interactions Statins/Certain Fruits    Protein (specify units) 75g    Fiber 25 grams    Whole Grain Foods 3 servings    Saturated Fats 12 max. grams    Fruits and Vegetables 5 servings/day    Sodium 1.5 grams      Personal Nutrition Goals   Nutrition Goal LT: weight increasing would like to maintain, improve breathing 7/10 now - when bad - 1-2 x/week sometimes after meals, rebuild microbiome    Comments Vitamin D prescription 50000 IUs. LDL has decreased and is now below or at 70. Low histamine diet. If B: oatmeal with raisins and brown sugar (at home blueberries) L: not big on lunch - at cafeteria (fish or meat that is more plain and two vegetables that are more plain) - will retain fluid (salt). Will try to not get restaurant food. Since COVID - will buy chicken, 93% beef - make hamburgers (freezes) (2-3x/week), pork roast, fressh fruits and vegetables usually comes bad from instant cart (carrots, potatoes, zucchini).  [place] chicken, salad, cheese, orange (canned). Tries to eat low sodium. snacks: choccolate and bag of chips, not eating lunch much. D: by 6pm most nights. chicken - chicken pie (makes own sauce with cheese, mushrooms, onions, carrots, bisquikc mix for crust), chicken and brown rice (chicken broth and cream cheese), hamburgers, meatloaf. likes 20 minute cooked meals. [place]tz ripple chips (low Na). water during the day. Appetite is low, taste and smell is altered. Will see doctor next monday for taste. Not a specific food that tastes different- it changes. Discussed adding two hearty snacks in the middle of the day; protein balls, tuna, bean salad, cucumbers/marys crackers/gluten free pretzles and hummus or other dip (she doesn't like  garlic). Discussed greek yogurt after she sees doctor to see if she likes it. Rachel Fry is gluten free, and allergic to tree nuts, eggs, peas, celery, milk, most raw vegetables - some cooked vegetables. discussed seeds, protein balls, proats.      Intervention Plan   Intervention Prescribe, educate and counsel regarding individualized specific dietary modifications aiming towards targeted core components such as weight, hypertension, lipid management, diabetes, heart failure and other comorbidities.;Nutrition handout(s) given to patient.    Expected Outcomes Short Term Goal: Understand basic principles of dietary content, such as calories, fat, sodium, cholesterol and nutrients.;Short Term Goal: A plan has been developed with personal nutrition goals set during dietitian appointment.;Long Term Goal: Adherence to prescribed nutrition plan.           Nutrition Assessments:  Nutrition Assessments - 07/29/20 1631      MEDFICTS Scores   Pre Score 17           MEDIFICTS Score Key:          ?70 Need to make dietary changes          40-70 Heart Healthy Diet         ? 40 Therapeutic Level Cholesterol Diet  Nutrition Goals Re-Evaluation:   Nutrition Goals Discharge (Final  Nutrition Goals Re-Evaluation):   Psychosocial: Target Goals: Acknowledge presence or absence of significant depression and/or stress, maximize coping skills, provide positive support system. Participant is able to verbalize types and ability to use techniques and skills needed for reducing stress and depression.   Education: Depression - Provides group verbal and written instruction on the correlation between heart/lung disease and depressed mood, treatment options, and the stigmas associated with seeking treatment.   Education: Sleep Hygiene -Provides group verbal and written instruction about how sleep can affect your health.  Define sleep hygiene, discuss sleep cycles and impact of sleep habits. Review good sleep hygiene  tips.    Education: Stress and Anxiety: - Provides group verbal and written instruction about the health risks of elevated stress and causes of high stress.  Discuss the correlation between heart/lung disease and anxiety and treatment options. Review healthy ways to manage with stress and anxiety.   Initial Review & Psychosocial Screening:  Initial Psych Review & Screening - 07/29/20 1141      Initial Review   Current issues with Current Stress Concerns    Source of Stress Concerns Unable to perform yard/household activities    Comments Since COVID has been ongoing she deals with the daily stress of that. She has had Covid and has been dealing with some side effects. She has been more short of breath recently and needs some conditioning to help with her shortness of breath. She feels optimisitc about her health and wants to get better with exercise.      Family Dynamics   Good Support System? Yes    Comments She can look to her husband, dog, three daughters and co-workers for support.      Barriers   Psychosocial barriers to participate in program The patient should benefit from training in stress management and relaxation.      Screening Interventions   Interventions To provide support and resources with identified psychosocial needs;Provide feedback about the scores to participant;Encouraged to exercise    Expected Outcomes Short Term goal: Utilizing psychosocial counselor, staff and physician to assist with identification of specific Stressors or current issues interfering with healing process. Setting desired goal for each stressor or current issue identified.;Long Term Goal: Stressors or current issues are controlled or eliminated.;Short Term goal: Identification and review with participant of any Quality of Life or Depression concerns found by scoring the questionnaire.;Long Term goal: The participant improves quality of Life and PHQ9 Scores as seen by post scores and/or verbalization  of changes           Quality of Life Scores:  Scores of 19 and below usually indicate a poorer quality of life in these areas.  A difference of  2-3 points is a clinically meaningful difference.  A difference of 2-3 points in the total score of the Quality of Life Index has been associated with significant improvement in overall quality of life, self-image, physical symptoms, and general health in studies assessing change in quality of life.  PHQ-9: Recent Review Flowsheet Data    Depression screen [facility] 2/9 07/29/2020 02/08/2020 03/28/2015   Decreased Interest 0 0 0   Down, Depressed, Hopeless 0 0 0   PHQ - 2 Score 0 0 0   Altered sleeping 0 - -   Tired, decreased energy 3  - -   Change in appetite 0 - -   Feeling bad or failure about yourself  0 - -   Trouble concentrating 0 - -   Moving slowly  or fidgety/restless 0 - -   Suicidal thoughts 0 - -   PHQ-9 Score 3 - -   Difficult doing work/chores Not difficult at all - -     Interpretation of Total Score  Total Score Depression Severity:  1-4 = Minimal depression, 5-9 = Mild depression, 10-14 = Moderate depression, 15-19 = Moderately severe depression, 20-27 = Severe depression   Psychosocial Evaluation and Intervention:  Psychosocial Evaluation - 07/29/20 1147      Psychosocial Evaluation & Interventions   Interventions Encouraged to exercise with the program and follow exercise prescription;Stress management education;Relaxation education    Comments Since COVID has been ongoing she deals with the daily stress of that. She has had Covid and has been dealing with some side effects. She has been more short of breath recently and needs some conditioning to help with her shortness of breath. She feels optimisitc about her health and wants to get better with exercise.    Expected Outcomes Short: Attend [facility] to improve move. Long: maintain a positive outllok on health and graduate [facility].    Continue Psychosocial Services  Follow  up required by staff           Psychosocial Re-Evaluation:  Psychosocial Re-Evaluation    [place] Name 08/22/20 1437             Psychosocial Re-Evaluation   Current issues with Current Stress Concerns       Comments She gets stressed with not being able to breath at times and gets short of breath. Rachel Fry has a positive outlook on her health and wants to get healthier.       Expected Outcomes Short: continue to exercise to improve shortness of breath and mood. Long: maintain exercise post [facility] to keep stress at a minimum.       Interventions Encouraged to attend Pulmonary Rehabilitation for the exercise       Continue Psychosocial Services  Follow up required by staff              Psychosocial Discharge (Final Psychosocial Re-Evaluation):  Psychosocial Re-Evaluation - 08/22/20 1437      Psychosocial Re-Evaluation   Current issues with Current Stress Concerns    Comments She gets stressed with not being able to breath at times and gets short of breath. Rachel Fry has a positive outlook on her health and wants to get healthier.    Expected Outcomes Short: continue to exercise to improve shortness of breath and mood. Long: maintain exercise post [facility] to keep stress at a minimum.    Interventions Encouraged to attend Pulmonary Rehabilitation for the exercise    Continue Psychosocial Services  Follow up required by staff           Education: Education Goals: Education classes will be provided on a weekly basis, covering required topics. Participant will state understanding/return demonstration of topics presented.  Learning Barriers/Preferences:  Learning Barriers/Preferences - 07/29/20 1139      Learning Barriers/Preferences   Learning Barriers Sight    Learning Preferences None           General Pulmonary Education Topics:  Infection Prevention: - Provides verbal and written material to individual with discussion of infection control including proper hand washing and  proper equipment cleaning during exercise session.   Pulmonary Rehab from 07/29/2020 in [facility] Cardiac and Pulmonary Rehab  Date 07/29/20  Educator [facility]  Instruction Review Code 1- Verbalizes Understanding      Falls Prevention: - Provides verbal and written material  to individual with discussion of falls prevention and safety.   Pulmonary Rehab from 07/29/2020 in [facility] Cardiac and Pulmonary Rehab  Date 07/29/20  Educator [facility]  Instruction Review Code 1- Verbalizes Understanding      Chronic Lung Diseases: - Group verbal and written instruction to review updates, respiratory medications, advancements in procedures and treatments. Discuss use of supplemental oxygen including available portable oxygen systems, continuous and intermittent flow rates, concentrators, personal use and safety guidelines. Review proper use of inhaler and spacers. Provide informative websites for self-education.    Energy Conservation: - Provide group verbal and written instruction for methods to conserve energy, plan and organize activities. Instruct on pacing techniques, use of adaptive equipment and posture/positioning to relieve shortness of breath.   Triggers and Exacerbations: - Group verbal and written instruction to review types of environmental triggers and ways to prevent exacerbations. Discuss weather changes, air quality and the benefits of nasal washing. Review warning signs and symptoms to help prevent infections. Discuss techniques for effective airway clearance, coughing, and vibrations.   AED/CPR: - Group verbal and written instruction with the use of models to demonstrate the basic use of the AED with the basic ABC's of resuscitation.   Anatomy and Physiology of the Lungs: - Group verbal and written instruction with the use of models to provide basic lung anatomy and physiology related to function, structure and complications of lung disease.   Anatomy & Physiology of the Heart: - Group verbal  and written instruction and models provide basic cardiac anatomy and physiology, with the coronary electrical and arterial systems. Review of Valvular disease and Heart Failure   Cardiac Medications: - Group verbal and written instruction to review commonly prescribed medications for heart disease. Reviews the medication, class of the drug, and side effects.   Other: -Provides group and verbal instruction on various topics (see comments)   Knowledge Questionnaire Score:  Knowledge Questionnaire Score - 07/29/20 1631      Knowledge Questionnaire Score   Pre Score 18/18            Core Components/Risk Factors/Patient Goals at Admission:  Personal Goals and Risk Factors at Admission - 07/29/20 1634      Core Components/Risk Factors/Patient Goals on Admission    Weight Management Yes;Weight Loss    Intervention Weight Management: Develop a combined nutrition and exercise program designed to reach desired caloric intake, while maintaining appropriate intake of nutrient and fiber, sodium and fats, and appropriate energy expenditure required for the weight goal.;Weight Management: Provide education and appropriate resources to help participant work on and attain dietary goals.;Weight Management/Obesity: Establish reasonable short term and long term weight goals.    Admit Weight 180 lb 3.2 oz (81.7 kg)    Goal Weight: Short Term 175 lb (79.4 kg)    Goal Weight: Long Term 170 lb (77.1 kg)    Expected Outcomes Short Term: Continue to assess and modify interventions until short term weight is achieved;Long Term: Adherence to nutrition and physical activity/exercise program aimed toward attainment of established weight goal;Weight Maintenance: Understanding of the daily nutrition guidelines, which includes 25-35% calories from fat, 7% or less cal from saturated fats, less than 246m cholesterol, less than 1.5gm of sodium, & 5 or more servings of fruits and vegetables daily;Weight Loss:  Understanding of general recommendations for a balanced deficit meal plan, which promotes 1-2 lb weight loss per week and includes a negative energy balance of [phone] kcal/d;Understanding recommendations for meals to include 15-35% energy as protein, 25-35% energy  from fat, 35-60% energy from carbohydrates, less than 269m of dietary cholesterol, 20-35 gm of total fiber daily;Understanding of distribution of calorie intake throughout the day with the consumption of 4-5 meals/snacks    Improve shortness of breath with ADL's Yes    Intervention Provide education, individualized exercise plan and daily activity instruction to help decrease symptoms of SOB with activities of daily living.    Expected Outcomes Short Term: Improve cardiorespiratory fitness to achieve a reduction of symptoms when performing ADLs;Long Term: Be able to perform more ADLs without symptoms or delay the onset of symptoms    Hypertension Yes    Intervention Provide education on lifestyle modifcations including regular physical activity/exercise, weight management, moderate sodium restriction and increased consumption of fresh fruit, vegetables, and low fat dairy, alcohol moderation, and smoking cessation.;Monitor prescription use compliance.    Expected Outcomes Short Term: Continued assessment and intervention until BP is < 140/944mHG in hypertensive participants. < 130/80107mG in hypertensive participants with diabetes, heart failure or chronic kidney disease.;Long Term: Maintenance of blood pressure at goal levels.    Lipids Yes    Intervention Provide education and support for participant on nutrition & aerobic/resistive exercise along with prescribed medications to achieve LDL <61m31mDL >40mg21m Expected Outcomes Short Term: Participant states understanding of desired cholesterol values and is compliant with medications prescribed. Participant is following exercise prescription and nutrition guidelines.;Long Term: Cholesterol  controlled with medications as prescribed, with individualized exercise RX and with personalized nutrition plan. Value goals: LDL < 61mg,54m > 40 mg.           Education:Diabetes - Individual verbal and written instruction to review signs/symptoms of diabetes, desired ranges of glucose level fasting, after meals and with exercise. Acknowledge that pre and post exercise glucose checks will be done for 3 sessions at entry of program.   Education: Know Your Numbers and Risk Factors: -Group verbal and written instruction about important numbers in your health.  Discussion of what are risk factors and how they play a role in the disease process.  Review of Cholesterol, Blood Pressure, Diabetes, and BMI and the role they play in your overall health.   Core Components/Risk Factors/Patient Goals Review:   Goals and Risk Factor Review    Row Name 08/22/20 1429             Core Components/Risk Factors/Patient Goals Review   Personal Goals Review Weight Management/Obesity;Improve shortness of breath with ADL's       Review If sue gets up at night and walks up the three steps to go to the bathroom she will get short of breath. She is going to try to sit up first take a few deep breaths then get up. Work on PLB while doing strenous activities.       Expected Outcomes Short: Work on PLB when doing ADL's. Long: maintain breathing techniques at home independently.              Core Components/Risk Factors/Patient Goals at Discharge (Final Review):   Goals and Risk Factor Review - 08/22/20 1429      Core Components/Risk Factors/Patient Goals Review   Personal Goals Review Weight Management/Obesity;Improve shortness of breath with ADL's    Review If sue gets up at night and walks up the three steps to go to the bathroom she will get short of breath. She is going to try to sit up first take a few deep breaths then get up. Work on PLB  while doing strenous activities.    Expected Outcomes Short:  Work on PLB when doing ADL's. Long: maintain breathing techniques at home independently.           ITP Comments:  ITP Comments    Row Name 07/29/20 1159 07/29/20 1634 07/31/20 0733 08/05/20 1525 08/07/20 1620   ITP Comments In person Visit completed. Patient informed on EP and RD appointment and 6 Minute walk test. Patient also informed of patient health questionnaires on My Chart. Patient Verbalizes understanding. Visit diagnosis can be found in [facility] 07/19/2020. Completed 6MWT and gym orientation. Initial ITP created and sent for review to Dr. Emily Filbert, Medical Director. 30 day review completed. ITP sent to Dr. Emily Filbert, Medical Director of Cardiac and Pulmonary Rehab. Continue with ITP unless changes are made by physician.  Patient is new to program. Completed Initial RD Evaluation First full day of exercise!  Patient was oriented to gym and equipment including functions, settings, policies, and procedures.  Patient's individual exercise prescription and treatment plan were reviewed.  All starting workloads were established based on the results of the 6 minute walk test done at initial orientation visit.  The plan for exercise progression was also introduced and progression will be customized based on patient's performance and goals.   [place] Name 08/28/20 0749           ITP Comments 30 day review completed. ITP sent to Dr. Emily Filbert, Medical Director of Cardiac and Pulmonary Rehab. Continue with ITP unless changes are made by physician.              Comments: 30 day review

## 2020-08-28 NOTE — Progress Notes (Signed)
Daily Session Note  Patient Details  Name: SHERIAL EBRAHIM MRN: [id number] Date of Birth: [birth year] Referring Provider:     Pulmonary Rehab from 07/29/2020 in [facility] Cardiac and Pulmonary Rehab  Referring Provider Praveen      Encounter Date: 08/28/2020  Check In:  Session Check In - 08/28/20 1128      Check-In   Supervising physician immediately available to respond to emergencies See telemetry face sheet for immediately available ER MD    Location [facility]-Cardiac & Pulmonary Rehab    Staff Present Birdie Sons, MPA, RN;Joseph Darrin Nipper, MA, RCEP, CCRP, CCET    Virtual Visit No    Medication changes reported     No    Fall or balance concerns reported    No    Warm-up and Cool-down Performed on first and last piece of equipment    Resistance Training Performed Yes    VAD Patient? No    PAD/SET Patient? No      Pain Assessment   Currently in Pain? No/denies              Social History   Tobacco Use  Smoking Status Never Smoker  Smokeless Tobacco Never Used    Goals Met:  Independence with exercise equipment Exercise tolerated well No report of cardiac concerns or symptoms Strength training completed today  Goals Unmet:  Not Applicable  Comments: Pt able to follow exercise prescription today without complaint.  Will continue to monitor for progression.    Dr. Emily Filbert is Medical Director for [place] and [facility].

## 2020-08-29 ENCOUNTER — Encounter: Payer: 59 | Admitting: *Deleted

## 2020-08-29 ENCOUNTER — Other Ambulatory Visit: Payer: Self-pay

## 2020-08-29 DIAGNOSIS — R0600 Dyspnea, unspecified: Secondary | ICD-10-CM | POA: Diagnosis not present

## 2020-08-29 NOTE — Progress Notes (Signed)
Daily Session Note  Patient Details  Name: BRADEN DELOACH MRN: [id number] Date of Birth: [birth year] Referring Provider:     Pulmonary Rehab from 07/29/2020 in [facility] Cardiac and Pulmonary Rehab  Referring Provider Praveen      Encounter Date: 08/29/2020  Check In:  Session Check In - 08/29/20 1705      Check-In   Supervising physician immediately available to respond to emergencies See telemetry face sheet for immediately available ER MD    Location [facility]-Cardiac & Pulmonary Rehab    Staff Present Justin Mend RCP,RRT,BSRT;Florie Carico Sherryll Burger, RN Moises Blood, BS, ACSM CEP, Exercise Physiologist    Virtual Visit No    Medication changes reported     No    Fall or balance concerns reported    No    Warm-up and Cool-down Performed on first and last piece of equipment    Resistance Training Performed Yes    VAD Patient? No    PAD/SET Patient? No      Pain Assessment   Currently in Pain? No/denies              Social History   Tobacco Use  Smoking Status Never Smoker  Smokeless Tobacco Never Used    Goals Met:  Independence with exercise equipment Exercise tolerated well No report of cardiac concerns or symptoms Strength training completed today  Goals Unmet:  Not Applicable  Comments: Pt able to follow exercise prescription today without complaint.  Will continue to monitor for progression.    Dr. Emily Filbert is Medical Director for [place] and [facility]monary [facility].

## 2020-09-02 ENCOUNTER — Other Ambulatory Visit: Payer: Self-pay

## 2020-09-02 DIAGNOSIS — R0600 Dyspnea, unspecified: Secondary | ICD-10-CM

## 2020-09-02 NOTE — Progress Notes (Signed)
Daily Session Note  Patient Details  Name: Rachel Fry MRN: [id number] Date of Birth: Aug 23, 1956 Referring Provider:     Pulmonary Rehab from 07/29/2020 in [facility] Cardiac and Pulmonary Rehab  Referring Provider Praveen      Encounter Date: 09/02/2020  Check In:  Session Check In - 09/02/20 1628      Check-In   Supervising physician immediately available to respond to emergencies See telemetry face sheet for immediately available ER MD    Location [facility]-Cardiac & Pulmonary Rehab    Staff Present Birdie Sons, MPA, RN;Meredith Sherryll Burger, RN Moises Blood, BS, ACSM CEP, Exercise Physiologist;Amanda Oletta Darter, [place], ACSM CEP, Exercise Physiologist    Virtual Visit No    Medication changes reported     No    Fall or balance concerns reported    No    Warm-up and Cool-down Performed on first and last piece of equipment    Resistance Training Performed Yes    VAD Patient? No    PAD/SET Patient? No      Pain Assessment   Currently in Pain? No/denies              Social History   Tobacco Use  Smoking Status Never Smoker  Smokeless Tobacco Never Used    Goals Met:  Independence with exercise equipment Exercise tolerated well No report of cardiac concerns or symptoms Strength training completed today  Goals Unmet:  Not Applicable  Comments: Pt able to follow exercise prescription today without complaint.  Will continue to monitor for progression.    Dr. Emily Filbert is Medical Director for [place] and [facility].

## 2020-09-04 ENCOUNTER — Other Ambulatory Visit: Payer: Self-pay

## 2020-09-04 DIAGNOSIS — R0600 Dyspnea, unspecified: Secondary | ICD-10-CM | POA: Diagnosis not present

## 2020-09-04 NOTE — Progress Notes (Signed)
Daily Session Note  Patient Details  Name: Rachel Fry MRN: [id number] Date of Birth: Mar 28, 1956 Referring Provider:     Pulmonary Rehab from 07/29/2020 in [facility] Cardiac and Pulmonary Rehab  Referring Provider Praveen      Encounter Date: 09/04/2020  Check In:  Session Check In - 09/04/20 1617      Check-In   Supervising physician immediately available to respond to emergencies See telemetry face sheet for immediately available ER MD    Location [facility]-Cardiac & Pulmonary Rehab    Staff Present Birdie Sons, MPA, Elveria Rising, BA, ACSM CEP, Exercise Physiologist;Meredith Sherryll Burger, RN Margurite Auerbach, MS Exercise Physiologist    Virtual Visit No    Medication changes reported     No    Fall or balance concerns reported    No    Warm-up and Cool-down Performed on first and last piece of equipment    Resistance Training Performed Yes    VAD Patient? No    PAD/SET Patient? No      Pain Assessment   Currently in Pain? No/denies              Social History   Tobacco Use  Smoking Status Never Smoker  Smokeless Tobacco Never Used    Goals Met:  Independence with exercise equipment Exercise tolerated well No report of cardiac concerns or symptoms Strength training completed today  Goals Unmet:  Not Applicable  Comments: Pt able to follow exercise prescription today without complaint.  Will continue to monitor for progression.    Dr. Emily Filbert is Medical Director for [place] and [facility].

## 2020-09-05 DIAGNOSIS — D8940 Mast cell activation, unspecified: Secondary | ICD-10-CM | POA: Diagnosis not present

## 2020-09-05 DIAGNOSIS — I10 Essential (primary) hypertension: Secondary | ICD-10-CM | POA: Diagnosis not present

## 2020-09-05 DIAGNOSIS — M4850XA Collapsed vertebra, not elsewhere classified, site unspecified, initial encounter for fracture: Secondary | ICD-10-CM | POA: Diagnosis not present

## 2020-09-05 MED FILL — XOLAIR 150 MG SOLR: 150 | 28 days supply | Qty: 2 | Fill #2

## 2020-09-09 ENCOUNTER — Other Ambulatory Visit: Payer: Self-pay

## 2020-09-09 DIAGNOSIS — R0600 Dyspnea, unspecified: Secondary | ICD-10-CM | POA: Diagnosis not present

## 2020-09-09 MED FILL — predniSONE 5 MG TABS: 5 | 30 days supply | Qty: 30 | Fill #2

## 2020-09-09 NOTE — Progress Notes (Signed)
Daily Session Note  Patient Details  Name: Rachel Fry MRN: [id number] Date of Birth: 11/25/1955 Referring Provider:     Pulmonary Rehab from 07/29/2020 in [facility] Cardiac and Pulmonary Rehab  Referring Provider Praveen      Encounter Date: 09/09/2020  Check In:  Session Check In - 09/09/20 1609      Check-In   Supervising physician immediately available to respond to emergencies See telemetry face sheet for immediately available ER MD    Location [facility]-Cardiac & Pulmonary Rehab    Staff Present Kelly Bollinger, MPA, RN;Kelly Hayes, BS, ACSM CEP, Exercise Physiologist;Jessica Hawkins, MA, RCEP, CCRP, CCET;Kara Langdon, MS Exercise Physiologist    Virtual Visit No    Medication changes reported     Yes    Comments added K2 supplement    Fall or balance concerns reported    No    Warm-up and Cool-down Performed on first and last piece of equipment    Resistance Training Performed Yes    VAD Patient? No    PAD/SET Patient? No      Pain Assessment   Currently in Pain? No/denies              Social History   Tobacco Use  Smoking Status Never Smoker  Smokeless Tobacco Never Used    Goals Met:  Independence with exercise equipment Exercise tolerated well Personal goals reviewed No report of cardiac concerns or symptoms Strength training completed today  Goals Unmet:  Not Applicable  Comments: Pt able to follow exercise prescription today without complaint.  Will continue to monitor for progression.    Dr. Mark Miller is Medical Director for [facility] and [facility]. 

## 2020-09-10 ENCOUNTER — Other Ambulatory Visit: Payer: Self-pay

## 2020-09-10 ENCOUNTER — Ambulatory Visit (INDEPENDENT_AMBULATORY_CARE_PROVIDER_SITE_OTHER): Payer: 59

## 2020-09-10 VITALS — BP 154/92 | HR 74 | Resp 18

## 2020-09-10 DIAGNOSIS — L501 Idiopathic urticaria: Secondary | ICD-10-CM | POA: Diagnosis not present

## 2020-09-10 NOTE — Progress Notes (Signed)
Patient came in today to get her Xolair injection and after several minutes started having lip swelling and was taken to be evaluated by the provider Dr. Ernst Bowler. Patient was given Zyrtec 10 mg and she stated she was still having itching and a rash on her body. Patient did well getting her Xolair medication and given medication of Zyrtec to improve her symptoms. Patient left the clinic without any issues following her injections.

## 2020-09-11 MED FILL — AIMOVIG 140 MG/ML SOAJ: 140 | 30 days supply | Qty: 1 | Fill #2

## 2020-09-12 ENCOUNTER — Other Ambulatory Visit: Payer: Self-pay

## 2020-09-12 ENCOUNTER — Encounter: Payer: 59 | Admitting: *Deleted

## 2020-09-12 DIAGNOSIS — R0600 Dyspnea, unspecified: Secondary | ICD-10-CM

## 2020-09-12 NOTE — Progress Notes (Signed)
Daily Session Note  Patient Details  Name: Rachel Fry MRN: [id number] Date of Birth: [birth year] Referring Provider:     Pulmonary Rehab from 07/29/2020 in [facility] Cardiac and Pulmonary Rehab  Referring Provider Praveen      Encounter Date: 09/12/2020  Check In:  Session Check In - 09/12/20 1544      Check-In   Supervising physician immediately available to respond to emergencies See telemetry face sheet for immediately available ER MD    Location [facility]-Cardiac & Pulmonary Rehab    Staff Present Renita Papa, RN BSN;Joseph [address] [place], [place], [place], CCRP, CCET    Virtual Visit No    Medication changes reported     No    Fall or balance concerns reported    No    Warm-up and Cool-down Performed on first and last piece of equipment    Resistance Training Performed Yes    VAD Patient? No    PAD/SET Patient? No      Pain Assessment   Currently in Pain? No/denies              Social History   Tobacco Use  Smoking Status Never Smoker  Smokeless Tobacco Never Used    Goals Met:  Independence with exercise equipment Exercise tolerated well No report of cardiac concerns or symptoms Strength training completed today  Goals Unmet:  Not Applicable  Comments: Pt able to follow exercise prescription today without complaint.  Will continue to monitor for progression.    Dr. Emily Filbert is Medical Director for [place] and [facility].

## 2020-09-16 ENCOUNTER — Other Ambulatory Visit: Payer: Self-pay

## 2020-09-16 DIAGNOSIS — R0600 Dyspnea, unspecified: Secondary | ICD-10-CM | POA: Diagnosis not present

## 2020-09-16 MED FILL — EZETIMIBE 10 MG TABS: 10 | 90 days supply | Qty: 90 | Fill #1

## 2020-09-16 NOTE — Progress Notes (Signed)
Daily Session Note  Patient Details  Name: Rachel Fry MRN: [id number] Date of Birth: 1955/11/15 Referring Provider:     Pulmonary Rehab from 07/29/2020 in [facility] Cardiac and Pulmonary Rehab  Referring Provider Praveen      Encounter Date: 09/16/2020  Check In:  Session Check In - 09/16/20 1600      Check-In   Supervising physician immediately available to respond to emergencies See telemetry face sheet for immediately available ER MD    Location [facility]-Cardiac & Pulmonary Rehab    Staff Present Birdie Sons, MPA, Mauricia Area, BS, ACSM CEP, Exercise Physiologist;Kara Eliezer Bottom, MS Exercise Physiologist    Virtual Visit No    Medication changes reported     No    Fall or balance concerns reported    No    Warm-up and Cool-down Performed on first and last piece of equipment    Resistance Training Performed Yes    VAD Patient? No    PAD/SET Patient? No      Pain Assessment   Currently in Pain? No/denies              Social History   Tobacco Use  Smoking Status Never Smoker  Smokeless Tobacco Never Used    Goals Met:  Independence with exercise equipment Exercise tolerated well No report of cardiac concerns or symptoms Strength training completed today  Goals Unmet:  Not Applicable  Comments: Pt able to follow exercise prescription today without complaint.  Will continue to monitor for progression.    Dr. Emily Filbert is Medical Director for [place] and [facility].

## 2020-09-17 ENCOUNTER — Ambulatory Visit
Admission: RE | Admit: 2020-09-17 | Discharge: 2020-09-17 | Disposition: A | Discharge status: Home / Self Care | Payer: 59 | Source: Ambulatory Visit

## 2020-09-17 ENCOUNTER — Ambulatory Visit
Admission: RE | Admit: 2020-09-17 | Discharge: 2020-09-17 | Disposition: A | Discharge status: Home / Self Care | Payer: 59 | Attending: Internal Medicine | Admitting: Internal Medicine

## 2020-09-17 ENCOUNTER — Other Ambulatory Visit: Payer: Self-pay | Admitting: *Deleted

## 2020-09-17 ENCOUNTER — Other Ambulatory Visit: Payer: Self-pay

## 2020-09-17 DIAGNOSIS — T148XXA Other injury of unspecified body region, initial encounter: Secondary | ICD-10-CM

## 2020-09-17 DIAGNOSIS — S22070A Wedge compression fracture of T9-T10 vertebra, initial encounter for closed fracture: Secondary | ICD-10-CM | POA: Diagnosis not present

## 2020-09-18 DIAGNOSIS — R0600 Dyspnea, unspecified: Secondary | ICD-10-CM | POA: Diagnosis not present

## 2020-09-18 NOTE — Progress Notes (Signed)
Daily Session Note  Patient Details  Name: Rachel Fry MRN: [id number] Date of Birth: June 24, 1956 Referring Provider:     Pulmonary Rehab from 07/29/2020 in [facility] Cardiac and Pulmonary Rehab  Referring Provider Praveen      Encounter Date: 09/18/2020  Check In:  Session Check In - 09/18/20 [place]      Check-In   Supervising physician immediately available to respond to emergencies See telemetry face sheet for immediately available ER MD    Location [facility]-Cardiac & Pulmonary Rehab    Staff Present Birdie Sons, MPA, Elveria Rising, BA, ACSM CEP, Exercise Physiologist;Kara Eliezer Bottom, MS Exercise Physiologist    Virtual Visit No    Medication changes reported     No    Fall or balance concerns reported    No    Warm-up and Cool-down Performed on first and last piece of equipment    Resistance Training Performed Yes    VAD Patient? No    PAD/SET Patient? No      Pain Assessment   Currently in Pain? No/denies              Social History   Tobacco Use  Smoking Status Never Smoker  Smokeless Tobacco Never Used    Goals Met:  Independence with exercise equipment Exercise tolerated well No report of cardiac concerns or symptoms Strength training completed today  Goals Unmet:  Not Applicable  Comments: Pt able to follow exercise prescription today without complaint.  Will continue to monitor for progression.    Dr. Emily Filbert is Medical Director for [place] and [facility].

## 2020-09-24 ENCOUNTER — Ambulatory Visit (INDEPENDENT_AMBULATORY_CARE_PROVIDER_SITE_OTHER): Payer: 59 | Admitting: Allergy & Immunology

## 2020-09-24 ENCOUNTER — Other Ambulatory Visit: Payer: Self-pay

## 2020-09-24 ENCOUNTER — Encounter: Payer: Self-pay | Admitting: Allergy & Immunology

## 2020-09-24 VITALS — HR 92 | Temp 97.2°F | Resp 14 | Ht 65.5 in | Wt 180.8 lb

## 2020-09-24 DIAGNOSIS — L501 Idiopathic urticaria: Secondary | ICD-10-CM | POA: Diagnosis not present

## 2020-09-24 NOTE — Patient Instructions (Addendum)
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 - Tammy will let you know when this is approved. - Continue with cetirizine 10mg  at night (you can add on a morning dose if you want to see if that helps).   2. Return in about 3 months (around 12/23/2020).    Please inform us of any Emergency Department visits, hospitalizations, or changes in symptoms. Call us before going to the ED for breathing or allergy symptoms since we might be able to fit you in for a sick visit. Feel free to contact us anytime with any questions, problems, or concerns.  It was a pleasure to see you again today!  Websites that have reliable patient information: 1. American Academy of Asthma, Allergy, and Immunology: www.aaaai.org 2. Food Allergy Research and Education (FARE): foodallergy.org 3. Mothers of Asthmatics: http://www.asthmacommunitynetwork.org 4. American College of Allergy, Asthma, and Immunology: www.acaai.org   COVID-19 Vaccine Information can be found at: ShippingScam.co.uk For questions related to vaccine distribution or appointments, please email vaccine@[place] .com or call [phone].     Like [place] on [place] and Instagram for our latest updates!     HAPPY FALL!     Make sure you are registered to vote! If you have moved or changed any of your contact information, you will need to get this updated before voting!  In some cases, you MAY be able to register to vote online: [email or web address]

## 2020-09-24 NOTE — Progress Notes (Signed)
FOLLOW UP  Date of Service/Encounter:  09/24/20   Assessment:   Post COVID syndrome  Chronic idiopathic urticaria - improved by not fully resolved on Xolair   Plan/Recommendations:   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 - Tammy will let you know when this is approved. - Continue with cetirizine 92m at night (you can add on a morning dose if you want to see if that helps).   2. Return in about 3 months (around 12/23/2020).   Subjective:   Rachel Fry a 64y.o. female presenting today for follow up of  Chief Complaint  Patient presents with  . Urticaria    still has the rash but it has decreased a little bit.    Rachel Fry has a history of the following: Patient Active Problem List   Diagnosis Date Noted  . Ageusia 08/12/2020  . 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 Myalgia, epidemic 08/12/2020  . Cough with exposure to COVID-19 virus 08/12/2020  . Olfactory impairment 05/29/2020  . Acute UTI 04/23/2020  . Left ureteral stone 04/23/2020  . Intermittent chest pain 02/09/2020  . History of COVID-19 02/09/2020  . Shortness of breath 02/09/2020  . Loss of taste 02/09/2020  . Loss of smell 02/09/2020  . Rash 02/09/2020  . Arthralgia 02/09/2020  . Postoperative history of checked last year 04/22/2017  . Weakness of left arm 04/22/2017  . Numbness and tingling in left arm 04/22/2017  . Lower back pain 04/02/2016  . Acute stress disorder 02/25/2015  . Anxiety 02/25/2015  . Airway hyperreactivity 02/25/2015  . Bradycardia 02/25/2015  . Hypersomnia 02/25/2015  . Clinical depression 02/25/2015  . Elevated blood sugar 02/25/2015  . 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 Acid reflux 02/25/2015  . 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 H/O disease 02/25/2015  . Hypercholesteremia 02/25/2015  . Headache, migraine 02/25/2015  . Muscle ache 02/25/2015  . Allergic rhinitis, seasonal 02/25/2015  .  Avitaminosis D 02/25/2015    History obtained from: chart review and patient.  Rachel Fry a 64y.o. female presenting for a follow up visit.  She was last seen in August 2021.  At that time, she had successfully tolerated both vaccinations for COVID-19.  She was continued to have a rash which was apparently biopsied and was consistent with chronic idiopathic urticaria.  She was on antihistamines with some relief, but was experiencing more fatigue from those.  She we decided at the last visit to advance treatment to [place]  We spent a lot of time talking about it at the last visit.  She has since received 3 doses.  She takes cetirizine when she goes to bed. She has been on that for "152 years", so she does not feel any worse in the morning. She has not tried using her cetirizine the morning as well as the evening.   Post COVID fatigue is better, but not gone completely. It is still a day to day concern. She did have PFTs done that showed moderate obstruction with her asthma history. She does a spirometry at her PCP's office. She had a chest CT that was good. They did find a T4 compression fracture. She is on pulmonary rehab for 36 sessions total. Pulmonology thinks that this might be deconditioning from the [place] [place] She thinks that this is helping somewhat. She actually works in the Pulmonology.   She is undergoing smell therapy. She ended up  buying a kit on [employer] to help her sense of smell. Smells include clove, ginger, rose, grapefruit, and a few others. She thinks that these have helped the situation.   Otherwise, there have been no changes to her past medical history, surgical history, family history, or social history.    Review of Systems  Constitutional: Negative.  Negative for chills, fever, malaise/fatigue and weight loss.  HENT: Negative for congestion, ear discharge, ear pain and sinus pain.   Eyes: Negative for pain, discharge and redness.  Respiratory: Negative for cough, sputum  production, shortness of breath and wheezing.   Cardiovascular: Negative.  Negative for chest pain and palpitations.  Gastrointestinal: Negative for abdominal pain, constipation, diarrhea, heartburn, nausea and vomiting.  Skin: Negative.  Negative for itching and rash.  Neurological: Negative for dizziness and headaches.  Endo/Heme/Allergies: Negative for environmental allergies. Does not bruise/bleed easily.       Objective:   Pulse 92, temperature (!) 97.2 F (36.2 C), resp. rate 14, height 5' 5.5" (1.664 m), weight 180 lb 12.8 oz (82 kg), SpO2 98 %. Body mass index is 29.63 kg/m.   Physical Exam:  Physical Exam Constitutional:      Appearance: She is well-developed.     Comments: Talkative female. Cooperative with the exam.   HENT:     Head: Normocephalic and atraumatic.     Right Ear: Tympanic membrane, ear canal and external ear normal.     Left Ear: Tympanic membrane and ear canal normal.     Nose: No nasal deformity, septal deviation, mucosal edema or rhinorrhea.     Right Sinus: No maxillary sinus tenderness or frontal sinus tenderness.     Left Sinus: No maxillary sinus tenderness or frontal sinus tenderness.     Mouth/Throat:     Mouth: Mucous membranes are not pale and not dry.     Pharynx: Uvula midline.  Eyes:     General:        Right eye: No discharge.        Left eye: No discharge.     Conjunctiva/sclera: Conjunctivae normal.     Right eye: Right conjunctiva is not injected. No chemosis.    Left eye: Left conjunctiva is not injected. No chemosis.    Pupils: Pupils are equal, round, and reactive to light.  Cardiovascular:     Rate and Rhythm: Normal rate and regular rhythm.     Heart sounds: Normal heart sounds.  Pulmonary:     Effort: Pulmonary effort is normal. No tachypnea, accessory muscle usage or respiratory distress.     Breath sounds: Normal breath sounds. No wheezing, rhonchi or rales.  Chest:     Chest wall: No tenderness.  Lymphadenopathy:      Cervical: No cervical adenopathy.  Skin:    Coloration: Skin is not pale.     Findings: No abrasion, erythema, petechiae or rash. Rash is not papular, urticarial or vesicular.     Comments: She does have some isolated urticarial, somewhat papular lesions on her arms. There are some excoriations present on her arms as well.   Neurological:     Mental Status: She is alert.      Diagnostic studies: none     Salvatore Marvel, MD  [facility] [place] [facility] [place]

## 2020-09-25 ENCOUNTER — Encounter: Payer: Self-pay | Admitting: *Deleted

## 2020-09-25 DIAGNOSIS — R0600 Dyspnea, unspecified: Secondary | ICD-10-CM

## 2020-09-25 NOTE — Progress Notes (Signed)
Pulmonary Individual Treatment Plan  Patient Details  Name: Rachel Fry MRN: [id number] Date of Birth: 10-21-1956 Referring Provider:     Pulmonary Rehab from 07/29/2020 in [facility] Cardiac and Pulmonary Rehab  Referring Provider Praveen      Initial Encounter Date:    Pulmonary Rehab from 07/29/2020 in [facility] Cardiac and Pulmonary Rehab  Date 07/29/20      Visit Diagnosis: Dyspnea, unspecified type  Patient's Home Medications on Admission:  Current Outpatient Medications:  .  albuterol (VENTOLIN HFA) 108 (90 Base) MCG/ACT inhaler, Inhale 2 puffs into the lungs every 4 (four) hours as needed for wheezing or shortness of breath. , Disp: , Rfl:  .  atorvastatin (LIPITOR) 20 MG tablet, TAKE 1 TABLET (20 MG TOTAL) BY MOUTH DAILY. (Patient taking differently: Take 20 mg by mouth at bedtime. ), Disp: 90 tablet, Rfl: 3 .  b complex vitamins capsule, Take 1 capsule by mouth daily., Disp: , Rfl:  .  Bacillus Coagulans-Inulin (PROBIOTIC) 1-250 BILLION-MG CAPS, Probiotic  Take 1 cap po daily, Disp: , Rfl:  .  botulinum toxin Type A (BOTOX) 100 UNITS SOLR injection, Botox - Historical Medication  once every 12 weeks for migraines Active, Disp: , Rfl:  .  cetirizine (ZYRTEC) 10 MG tablet, Take 10 mg by mouth every evening. , Disp: , Rfl:  .  cloNIDine (CATAPRES) 0.1 MG tablet, Take 1 tablet (0.1 mg total) by mouth 2 (two) times daily., Disp: 60 tablet, Rfl:  .  EPINEPHrine 0.3 mg/0.3 mL IJ SOAJ injection, Inject 0.3 mg into the muscle as needed for anaphylaxis. , Disp: , Rfl: 1 .  Erenumab-aooe (AIMOVIG) 70 MG/ML SOAJ, Inject 70 mg into the skin every 30 (thirty) days. , Disp: , Rfl:  .  ezetimibe (ZETIA) 10 MG tablet, Take 10 mg by mouth every evening. , Disp: , Rfl:  .  famotidine (PEPCID) 20 MG tablet, Take 20 mg by mouth 2 (two) times daily., Disp: , Rfl:  .  fluticasone (FLOVENT HFA) 110 MCG/ACT inhaler, Inhale 2 puffs into the lungs 2 (two) times daily. , Disp: , Rfl:  .  frovatriptan (FROVA)  2.5 MG tablet, Take 2.5 mg by mouth 2 (two) times daily as needed for migraine. Max of 3 doses per 7 days, Disp: , Rfl:  .  furosemide (LASIX) 20 MG tablet, Take 40 mg by mouth daily as needed for edema. , Disp: , Rfl:  .  levothyroxine (SYNTHROID, LEVOTHROID) 50 MCG tablet, Take 50 mcg by mouth daily before breakfast. , Disp: , Rfl: 5 .  Menaquinone-7 (VITAMIN K2 PO), Take by mouth., Disp: , Rfl:  .  omalizumab (XOLAIR) 150 MG injection, Inject 300 mg into the skin every 28 (twenty-eight) days., Disp: 2 each, Rfl: 11 .  Polyethyl Glycol-Propyl Glycol (SYSTANE OP), Place 1-2 drops into both eyes 2 (two) times daily., Disp: , Rfl:  .  predniSONE (DELTASONE) 5 MG tablet, Take 5 mg by mouth daily., Disp: , Rfl:  .  Rimegepant Sulfate (NURTEC) 75 MG TBDP, Take 75 mg by mouth daily as needed (migraines). Max 15 doses per 30 days, Disp: , Rfl:  .  rizatriptan (MAXALT) 10 MG tablet, Take 10 mg by mouth 2 (two) times daily as needed for migraine. Max doses of 3 doses per 7 days, Disp: , Rfl:  .  spironolactone (ALDACTONE) 25 MG tablet, Take 50 mg by mouth daily. , Disp: , Rfl:  .  triamcinolone (NASACORT ALLERGY 24HR) 55 MCG/ACT AERO nasal inhaler, Place 2 sprays  into the nose at bedtime., Disp: , Rfl:  .  Vitamin D, Ergocalciferol, (DRISDOL) 1.25 MG (50000 UT) CAPS capsule, Take 50,000 Units by mouth every Sunday. , Disp: , Rfl: 3 .  zafirlukast (ACCOLATE) 10 MG tablet, Take 10 mg by mouth 2 (two) times daily. , Disp: , Rfl:   Current Facility-Administered Medications:  .  omalizumab Arvid Right) injection 300 mg, 300 mg, Subcutaneous, Q28 days, Valentina Shaggy, MD, 300 mg at 09/10/20 1807  Past Medical History: Past Medical History:  Diagnosis Date  . Allergy   . Asthma   . History of kidney stones   . Hypothyroidism   . Migraines   . Numbness and tingling    left side of body, occasionally right side   . S/P Botox injection     Tobacco Use: Social History   Tobacco Use  Smoking Status  Never Smoker  Smokeless Tobacco Never Used    Labs: Recent Review Flowsheet Data    Labs for ITP Cardiac and Pulmonary Rehab Latest Ref Rng & Units 03/30/2014 07/24/2014 05/03/2015 12/10/2016 09/26/2019   Cholestrol 100 - 199 mg/dL 214(A) 177 - 201(H) -   LDLCALC 0 - 99 mg/dL 140 115(H) - 123(H) -   HDL >39 mg/dL 59 46 - 66 59   Trlycerides 0 - 149 mg/dL 74 81 - 61 116   Hemoglobin A1c 4.8 - 5.6 % 5.9 - 6.0(H) 5.4 -       Pulmonary Assessment Scores:  Pulmonary Assessment Scores    Row Name 07/29/20 1630         ADL UCSD   SOB Score total 45     Rest 1     Walk 3     Stairs 4     Bath 0     Dress 0     Shop 2       CAT Score   CAT Score 16       mMRC Score   mMRC Score 2            UCSD: Self-administered rating of dyspnea associated with activities of daily living (ADLs) 6-point scale (0 = "not at all" to 5 = "maximal or unable to do because of breathlessness")  Scoring Scores range from 0 to 120.  Minimally important difference is 5 units  CAT: CAT can identify the health impairment of COPD patients and is better correlated with disease progression.  CAT has a scoring range of zero to 40. The CAT score is classified into four groups of low (less than 10), medium (10 - 20), high (21-30) and very high (31-40) based on the impact level of disease on health status. A CAT score over 10 suggests significant symptoms.  A worsening CAT score could be explained by an exacerbation, poor medication adherence, poor inhaler technique, or progression of COPD or comorbid conditions.  CAT MCID is 2 points  mMRC: mMRC (Modified Medical Research Council) Dyspnea Scale is used to assess the degree of baseline functional disability in patients of respiratory disease due to dyspnea. No minimal important difference is established. A decrease in score of 1 point or greater is considered a positive change.   Pulmonary Function Assessment:  Pulmonary Function Assessment - 07/29/20 1138       Breath   Bilateral Breath Sounds Clear    Shortness of Breath Yes;Limiting activity           Exercise Target Goals: Exercise Program Goal: Individual exercise prescription set using results from  initial 6 min walk test and THRR while considering  patient's activity barriers and safety.   Exercise Prescription Goal: Initial exercise prescription builds to 30-45 minutes a day of aerobic activity, 2-3 days per week.  Home exercise guidelines will be given to patient during program as part of exercise prescription that the participant will acknowledge.  Education: Aerobic Exercise: - Group verbal and visual presentation on the components of exercise prescription. Introduces F.I.T.T principle from ACSM for exercise prescriptions.  Reviews F.I.T.T. principles of aerobic exercise including progression. Written material given at graduation.   Education: Resistance Exercise: - Group verbal and visual presentation on the components of exercise prescription. Introduces F.I.T.T principle from ACSM for exercise prescriptions  Reviews F.I.T.T. principles of resistance exercise including progression. Written material given at graduation.    Education: Exercise & Equipment Safety: - Individual verbal instruction and demonstration of equipment use and safety with use of the equipment.   Pulmonary Rehab from 09/04/2020 in [facility] Cardiac and Pulmonary Rehab  Date 07/29/20  Educator [facility]  Instruction Review Code 1- Verbalizes Understanding      Education: Exercise Physiology & General Exercise Guidelines: - Group verbal and written instruction with models to review the exercise physiology of the cardiovascular system and associated critical values. Provides general exercise guidelines with specific guidelines to those with heart or lung disease.    Education: Flexibility, Balance, Mind/Body Relaxation: - Group verbal and visual presentation with interactive activity on the components of exercise  prescription. Introduces F.I.T.T principle from ACSM for exercise prescriptions. Reviews F.I.T.T. principles of flexibility and balance exercise training including progression. Also discusses the mind body connection.  Reviews various relaxation techniques to help reduce and manage stress (i.e. Deep breathing, progressive muscle relaxation, and visualization). Balance handout provided to take home. Written material given at graduation.   Activity Barriers & Risk Stratification:  Activity Barriers & Cardiac Risk Stratification - 07/29/20 1152      Activity Barriers & Cardiac Risk Stratification   Activity Barriers Shortness of Breath;Balance Concerns;Chest Pain/Angina;Other (comment);Fibromyalgia    Comments Chest pain from Covid.    Cardiac Risk Stratification Low           6 Minute Walk:  6 Minute Walk    Row Name 07/29/20 1622         6 Minute Walk   Phase Initial     Distance 1480 feet     Walk Time 6 minutes     # of Rest Breaks 0     MPH 2.8     METS 3.6     RPE 13     Perceived Dyspnea  2     VO2 Peak 12.6     Symptoms No     Resting HR 81 bpm     Resting BP 134/78     Resting Oxygen Saturation  97 %     Exercise Oxygen Saturation  during 6 min walk 95 %     Max Ex. HR 105 bpm     Max Ex. BP 154/84     2 Minute Post BP 136/84       Interval HR   1 Minute HR 96     2 Minute HR 97     3 Minute HR 102     4 Minute HR 101     5 Minute HR 105     6 Minute HR 104     2 Minute Post HR 88     Interval Heart Rate? Yes  Interval Oxygen   Interval Oxygen? Yes     Baseline Oxygen Saturation % 97 %     1 Minute Oxygen Saturation % 96 %     1 Minute Liters of Oxygen 0 L     2 Minute Oxygen Saturation % 96 %     2 Minute Liters of Oxygen 0 L     3 Minute Oxygen Saturation % 95 %     3 Minute Liters of Oxygen 0 L     4 Minute Oxygen Saturation % 96 %     4 Minute Liters of Oxygen 0 L     5 Minute Oxygen Saturation % 97 %     5 Minute Liters of Oxygen 0 L      6 Minute Oxygen Saturation % 94 %     6 Minute Liters of Oxygen 0 L     2 Minute Post Oxygen Saturation % 96 %     2 Minute Post Liters of Oxygen 0 L           Oxygen Initial Assessment:  Oxygen Initial Assessment - 07/29/20 1138      Home Oxygen   Home Oxygen Device None    Sleep Oxygen Prescription None    Home Exercise Oxygen Prescription None    Home Resting Oxygen Prescription None      Initial 6 min Walk   Oxygen Used None      Program Oxygen Prescription   Program Oxygen Prescription None      Intervention   Short Term Goals To learn and understand importance of monitoring SPO2 with pulse oximeter and demonstrate accurate use of the pulse oximeter.;To learn and understand importance of maintaining oxygen saturations>88%;To learn and demonstrate proper pursed lip breathing techniques or other breathing techniques.;To learn and demonstrate proper use of respiratory medications    Long  Term Goals Verbalizes importance of monitoring SPO2 with pulse oximeter and return demonstration;Maintenance of O2 saturations>88%;Exhibits proper breathing techniques, such as pursed lip breathing or other method taught during program session;Compliance with respiratory medication;Demonstrates proper use of MDI's           Oxygen Re-Evaluation:  Oxygen Re-Evaluation    Row Name 08/07/20 1620 08/22/20 1424 09/09/20 1623         Program Oxygen Prescription   Program Oxygen Prescription None None None       Home Oxygen   Home Oxygen Device None None None     Sleep Oxygen Prescription None None None     Home Exercise Oxygen Prescription None None None     Home Resting Oxygen Prescription None None None       Goals/Expected Outcomes   Short Term Goals To learn and understand importance of monitoring SPO2 with pulse oximeter and demonstrate accurate use of the pulse oximeter.;To learn and understand importance of maintaining oxygen saturations>88%;To learn and demonstrate proper pursed  lip breathing techniques or other breathing techniques.;To learn and demonstrate proper use of respiratory medications To learn and understand importance of maintaining oxygen saturations>88%;To learn and understand importance of monitoring SPO2 with pulse oximeter and demonstrate accurate use of the pulse oximeter.;To learn and demonstrate proper use of respiratory medications To learn and understand importance of maintaining oxygen saturations>88%;To learn and understand importance of monitoring SPO2 with pulse oximeter and demonstrate accurate use of the pulse oximeter.;To learn and demonstrate proper use of respiratory medications;To learn and demonstrate proper pursed lip breathing techniques or other breathing techniques.     Long  Term Goals Verbalizes importance of monitoring SPO2 with pulse oximeter and return demonstration;Maintenance of O2 saturations>88%;Exhibits proper breathing techniques, such as pursed lip breathing or other method taught during program session;Compliance with respiratory medication;Demonstrates proper use of MDI's Maintenance of O2 saturations>88%;Verbalizes importance of monitoring SPO2 with pulse oximeter and return demonstration;Demonstrates proper use of MDI's Maintenance of O2 saturations>88%;Verbalizes importance of monitoring SPO2 with pulse oximeter and return demonstration;Demonstrates proper use of MDI's;Exhibits proper breathing techniques, such as pursed lip breathing or other method taught during program session     Comments Reviewed PLB technique with pt.  Talked about how it works and it's importance in maintaining their exercise saturations. Rachel Fry states that she has been practicing PLB and has been using it. She uses her pulse oximeter at home and knows how to use it. She knows to keep her oxygen above 88 percent. She takes Flovent, Acolate, Zyrtec and Ventolin HFA as needed. She has no questions about her medications. Rachel Fry is doing well with her PLB.  She finds  that it is helpful for maintaining breath control and is starting to do it more without thinking about it.  She is doing well with her sats and will start to watch her sats with exercise at home.     Goals/Expected Outcomes Short: Become more profiecient at using PLB.   Long: Become independent at using PLB Short: continue compliance with her medications and program regimine. Long: maintain exercise in [facility] and use breathing techniques. Short: Continue to work on [employer] with exercise at home Long; Continue to exericse to improve breathing.            Oxygen Discharge (Final Oxygen Re-Evaluation):  Oxygen Re-Evaluation - 09/09/20 1623      Program Oxygen Prescription   Program Oxygen Prescription None      Home Oxygen   Home Oxygen Device None    Sleep Oxygen Prescription None    Home Exercise Oxygen Prescription None    Home Resting Oxygen Prescription None      Goals/Expected Outcomes   Short Term Goals To learn and understand importance of maintaining oxygen saturations>88%;To learn and understand importance of monitoring SPO2 with pulse oximeter and demonstrate accurate use of the pulse oximeter.;To learn and demonstrate proper use of respiratory medications;To learn and demonstrate proper pursed lip breathing techniques or other breathing techniques.    Long  Term Goals Maintenance of O2 saturations>88%;Verbalizes importance of monitoring SPO2 with pulse oximeter and return demonstration;Demonstrates proper use of MDI's;Exhibits proper breathing techniques, such as pursed lip breathing or other method taught during program session    Comments Rachel Fry is doing well with her PLB.  She finds that it is helpful for maintaining breath control and is starting to do it more without thinking about it.  She is doing well with her sats and will start to watch her sats with exercise at home.    Goals/Expected Outcomes Short: Continue to work on [employer] with exercise at home Long;  Continue to exericse to improve breathing.           Initial Exercise Prescription:  Initial Exercise Prescription - 07/29/20 1600      Date of Initial Exercise RX and Referring Provider   Date 07/29/20    Referring Provider Praveen      Treadmill   MPH 2.8    Grade 1    Minutes 15    METs 3.6      Recumbant Bike   Level 1  RPM 60    Minutes 15    METs 3.6      NuStep   Level 3    SPM 80    Minutes 15    METs 3.6      Prescription Details   Frequency (times per week) 3    Duration Progress to 30 minutes of continuous aerobic without signs/symptoms of physical distress      Intensity   THRR 40-80% of Max Heartrate 110-140    Ratings of Perceived Exertion 11-15    Perceived Dyspnea 0-4      Resistance Training   Training Prescription Yes    Weight 2 lb    Reps 10-15           Perform Capillary Blood Glucose checks as needed.  Exercise Prescription Changes:  Exercise Prescription Changes    Row Name 07/29/20 1600 08/12/20 1000 08/27/20 1000 09/09/20 1200 09/24/20 1500     Response to Exercise   Blood Pressure (Admit) 134/78 132/82 130/58 142/70 154/92   Blood Pressure (Exercise) 154/84 162/80 146/64 142/82 154/78   Blood Pressure (Exit) 136/84 130/74 122/60 124/80 120/78   Heart Rate (Admit) 81 bpm 84 bpm 78 bpm 84 bpm 79 bpm   Heart Rate (Exercise) 105 bpm 101 bpm 109 bpm 107 bpm 104 bpm   Heart Rate (Exit) 88 bpm 84 bpm 87 bpm 88 bpm 78 bpm   Oxygen Saturation (Admit) 97 % 98 % 95 % 96 % 96 %   Oxygen Saturation (Exercise) 94 % 95 % 97 % 94 % 97 %   Oxygen Saturation (Exit) 96 % 94 % 98 % 95 % 96 %   Rating of Perceived Exertion (Exercise) '13 13 14 13 14   ' Perceived Dyspnea (Exercise) '2 2 3 2 2   ' Symptoms -- -- none none none   Comments -- first day exercise -- -- --   Duration -- -- Continue with 30 min of aerobic exercise without signs/symptoms of physical distress. Continue with 30 min of aerobic exercise without signs/symptoms of physical  distress. Continue with 30 min of aerobic exercise without signs/symptoms of physical distress.   Intensity -- -- THRR unchanged THRR unchanged THRR unchanged     Progression   Progression -- -- Continue to progress workloads to maintain intensity without signs/symptoms of physical distress. Continue to progress workloads to maintain intensity without signs/symptoms of physical distress. Continue to progress workloads to maintain intensity without signs/symptoms of physical distress.   Average METs -- -- 3.5 2.73 3.2     Resistance Training   Training Prescription -- -- Yes Yes Yes   Weight -- -- 2 lb 3 lb 3 lb   Reps -- -- 10-15 10-15 10-15     Treadmill   MPH -- -- 2.8 2.4 2.5   Grade -- -- '1 1 1   ' Minutes -- -- '15 15 15   ' METs -- -- 3.53 3.17 3.53     Recumbant Bike   Level -- -- 1.4 -- --   Minutes -- -- 15 -- --   METs -- -- 2.7 -- --     NuStep   Level -- -- '3 4 5   ' SPM -- -- 80 80 80   Minutes -- -- '15 15 15   ' METs -- -- 1.6 2.1 2.9          Exercise Comments:   Exercise Goals and Review:  Exercise Goals    Row Name 07/29/20 1613 07/29/20 1629  Exercise Goals   Increase Physical Activity Yes Yes      Intervention Provide advice, education, support and counseling about physical activity/exercise needs.;Develop an individualized exercise prescription for aerobic and resistive training based on initial evaluation findings, risk stratification, comorbidities and participant's personal goals. Provide advice, education, support and counseling about physical activity/exercise needs.;Develop an individualized exercise prescription for aerobic and resistive training based on initial evaluation findings, risk stratification, comorbidities and participant's personal goals.      Expected Outcomes Short Term: Attend rehab on a regular basis to increase amount of physical activity.;Long Term: Add in home exercise to make exercise part of routine and to increase amount  of physical activity.;Long Term: Exercising regularly at least 3-5 days a week. Short Term: Attend rehab on a regular basis to increase amount of physical activity.;Long Term: Add in home exercise to make exercise part of routine and to increase amount of physical activity.;Long Term: Exercising regularly at least 3-5 days a week.      Increase Strength and Stamina Yes Yes      Intervention Provide advice, education, support and counseling about physical activity/exercise needs.;Develop an individualized exercise prescription for aerobic and resistive training based on initial evaluation findings, risk stratification, comorbidities and participant's personal goals. Provide advice, education, support and counseling about physical activity/exercise needs.;Develop an individualized exercise prescription for aerobic and resistive training based on initial evaluation findings, risk stratification, comorbidities and participant's personal goals.      Expected Outcomes Short Term: Increase workloads from initial exercise prescription for resistance, speed, and METs.;Short Term: Perform resistance training exercises routinely during rehab and add in resistance training at home;Long Term: Improve cardiorespiratory fitness, muscular endurance and strength as measured by increased METs and functional capacity (6MWT) Short Term: Increase workloads from initial exercise prescription for resistance, speed, and METs.;Short Term: Perform resistance training exercises routinely during rehab and add in resistance training at home;Long Term: Improve cardiorespiratory fitness, muscular endurance and strength as measured by increased METs and functional capacity (6MWT)      Able to understand and use rate of perceived exertion (RPE) scale Yes Yes      Intervention Provide education and explanation on how to use RPE scale Provide education and explanation on how to use RPE scale      Expected Outcomes Short Term: Able to use RPE  daily in rehab to express subjective intensity level;Long Term:  Able to use RPE to guide intensity level when exercising independently Short Term: Able to use RPE daily in rehab to express subjective intensity level;Long Term:  Able to use RPE to guide intensity level when exercising independently      Able to understand and use Dyspnea scale Yes Yes      Intervention Provide education and explanation on how to use Dyspnea scale Provide education and explanation on how to use Dyspnea scale      Expected Outcomes Short Term: Able to use Dyspnea scale daily in rehab to express subjective sense of shortness of breath during exertion;Long Term: Able to use Dyspnea scale to guide intensity level when exercising independently Short Term: Able to use Dyspnea scale daily in rehab to express subjective sense of shortness of breath during exertion;Long Term: Able to use Dyspnea scale to guide intensity level when exercising independently      Knowledge and understanding of Target Heart Rate Range (THRR) Yes Yes      Intervention Provide education and explanation of THRR including how the numbers were predicted and where they are  located for reference Provide education and explanation of THRR including how the numbers were predicted and where they are located for reference      Expected Outcomes Short Term: Able to state/look up THRR;Short Term: Able to use daily as guideline for intensity in rehab;Long Term: Able to use THRR to govern intensity when exercising independently Short Term: Able to state/look up THRR;Short Term: Able to use daily as guideline for intensity in rehab;Long Term: Able to use THRR to govern intensity when exercising independently      Able to check pulse independently Yes Yes      Intervention Provide education and demonstration on how to check pulse in carotid and radial arteries.;Review the importance of being able to check your own pulse for safety during independent exercise Provide  education and demonstration on how to check pulse in carotid and radial arteries.;Review the importance of being able to check your own pulse for safety during independent exercise      Expected Outcomes Short Term: Able to explain why pulse checking is important during independent exercise;Long Term: Able to check pulse independently and accurately Short Term: Able to explain why pulse checking is important during independent exercise;Long Term: Able to check pulse independently and accurately      Understanding of Exercise Prescription Yes Yes      Intervention Provide education, explanation, and written materials on patient's individual exercise prescription Provide education, explanation, and written materials on patient's individual exercise prescription      Expected Outcomes Short Term: Able to explain program exercise prescription;Long Term: Able to explain home exercise prescription to exercise independently Short Term: Able to explain program exercise prescription;Long Term: Able to explain home exercise prescription to exercise independently             Exercise Goals Re-Evaluation :  Exercise Goals Re-Evaluation    [place] Name 08/07/20 1622 08/27/20 1031 09/09/20 1250 09/24/20 1528       Exercise Goal Re-Evaluation   Exercise Goals Review Increase Physical Activity;Able to understand and use rate of perceived exertion (RPE) scale;Knowledge and understanding of Target Heart Rate Range (THRR);Understanding of Exercise Prescription;Increase Strength and Stamina;Able to understand and use Dyspnea scale;Able to check pulse independently Increase Physical Activity;Able to understand and use rate of perceived exertion (RPE) scale;Knowledge and understanding of Target Heart Rate Range (THRR);Understanding of Exercise Prescription;Increase Strength and Stamina;Able to understand and use Dyspnea scale;Able to check pulse independently Increase Physical Activity;Increase Strength and Stamina Increase  Physical Activity;Increase Strength and Stamina    Comments Reviewed RPE and dyspnea scales, THR and program prescription with pt today.  Pt voiced understanding and was given a copy of goals to take home. Rachel Fry is doing well in rehab and adjusting well to the machines. Her oxygen levels are maintained well during exercise. She is still experiencing some SOB and will take breaks as needed. Will use PLB technique when she needs to. Will continue to monitor. Rachel Fry has attended consistently this month.  Staff will review home exercise in the next few sessions. Rachel Fry is progressing well and has increased speed on TM and level on T4 .  We will continue to monitor progress.    Expected Outcomes Short: Use RPE daily to regulate intensity. Long: Follow program prescription in THR. Short:  Increase speed on TM Long: Continue to progress overall MET level Short: continue to exercise consistently Long:  increase overall stamina Short:  review home exercise with EP Long: add exercise on days not at [facility]  Discharge Exercise Prescription (Final Exercise Prescription Changes):  Exercise Prescription Changes - 09/24/20 1500      Response to Exercise   Blood Pressure (Admit) 154/92    Blood Pressure (Exercise) 154/78    Blood Pressure (Exit) 120/78    Heart Rate (Admit) 79 bpm    Heart Rate (Exercise) 104 bpm    Heart Rate (Exit) 78 bpm    Oxygen Saturation (Admit) 96 %    Oxygen Saturation (Exercise) 97 %    Oxygen Saturation (Exit) 96 %    Rating of Perceived Exertion (Exercise) 14    Perceived Dyspnea (Exercise) 2    Symptoms none    Duration Continue with 30 min of aerobic exercise without signs/symptoms of physical distress.    Intensity THRR unchanged      Progression   Progression Continue to progress workloads to maintain intensity without signs/symptoms of physical distress.    Average METs 3.2      Resistance Training   Training Prescription Yes    Weight 3 lb    Reps 10-15       Treadmill   MPH 2.5    Grade 1    Minutes 15    METs 3.53      NuStep   Level 5    SPM 80    Minutes 15    METs 2.9           Nutrition:  Target Goals: Understanding of nutrition guidelines, daily intake of sodium <1567m, cholesterol <2027m calories 30% from fat and 7% or less from saturated fats, daily to have 5 or more servings of fruits and vegetables.  Education: All About Nutrition: -Group instruction provided by verbal, written material, interactive activities, discussions, models, and posters to present general guidelines for heart healthy nutrition including fat, fiber, MyPlate, the role of sodium in heart healthy nutrition, utilization of the nutrition label, and utilization of this knowledge for meal planning. Follow up email sent as well. Written material given at graduation.   [facility]  Date 09/04/20  [facility]- Verbalizes Understanding      Biometrics:  Pre Biometrics - 07/29/20 1620      Pre Biometrics   Height '5\' 5"'  (1.651 m)    Weight 180 lb 3.2 oz (81.7 kg)    BMI (Calculated) 29.99    Single Leg Stand 11.63 seconds            Nutrition Therapy Plan and Nutrition Goals:  Nutrition Therapy & Goals - 08/05/20 1313      Nutrition Therapy   Diet Heart healthy, low Na, Pulmonary MNT    Drug/Food Interactions Statins/Certain Fruits    Protein (specify units) 75g    Fiber 25 grams    Whole Grain Foods 3 servings    Saturated Fats 12 max. grams    Fruits and Vegetables 5 servings/day    Sodium 1.5 grams      Personal Nutrition Goals   Nutrition Goal LT: weight increasing would like to maintain, improve breathing 7/10 now - when bad - 1-2 x/week sometimes after meals, rebuild microbiome    Comments Vitamin D prescription 50000 IUs. LDL has decreased and is now below or at 70. Low histamine diet. If B: oatmeal with raisins and brown sugar (at home blueberries) L:  not big on lunch - at cafeteria (fish or meat that is more plain and two vegetables that are more plain) -  will retain fluid (salt). Will try to not get restaurant food. Since COVID - will buy chicken, 93% beef - make hamburgers (freezes) (2-3x/week), pork roast, fressh fruits and vegetables usually comes bad from instant cart (carrots, potatoes, zucchini).  [place] chicken, salad, cheese, orange (canned). Tries to eat low sodium. snacks: choccolate and bag of chips, not eating lunch much. D: by 6pm most nights. chicken - chicken pie (makes own sauce with cheese, mushrooms, onions, carrots, bisquikc mix for crust), chicken and brown rice (chicken broth and cream cheese), hamburgers, meatloaf. likes 20 minute cooked meals. [place] ripple chips (low Na). water during the day. Appetite is low, taste and smell is altered. Will see doctor next monday for taste. Not a specific food that tastes different- it changes. Discussed adding two hearty snacks in the middle of the day; protein balls, tuna, bean salad, cucumbers/marys crackers/gluten free pretzles and hummus or other dip (she doesn't like garlic). Discussed greek yogurt after she sees doctor to see if she likes it. Rachel Fry is gluten free, and allergic to tree nuts, eggs, peas, celery, milk, most raw vegetables - some cooked vegetables. discussed seeds, protein balls, proats.      Intervention Plan   Intervention Prescribe, educate and counsel regarding individualized specific dietary modifications aiming towards targeted core components such as weight, hypertension, lipid management, diabetes, heart failure and other comorbidities.;Nutrition handout(s) given to patient.    Expected Outcomes Short Term Goal: Understand basic principles of dietary content, such as calories, fat, sodium, cholesterol and nutrients.;Short Term Goal: A plan has been developed with personal nutrition goals set during dietitian appointment.;Long Term Goal: Adherence to prescribed nutrition  plan.           Nutrition Assessments:  Nutrition Assessments - 07/29/20 1631      MEDFICTS Scores   Pre Score 17          MEDIFICTS Score Key:  ?70 Need to make dietary changes   40-70 Heart Healthy Diet  ? 40 Therapeutic Level Cholesterol Diet   Picture Your Plate Scores:  <03 Unhealthy dietary pattern with much room for improvement.  41-50 Dietary pattern unlikely to meet recommendations for good health and room for improvement.  51-60 More healthful dietary pattern, with some room for improvement.   >60 Healthy dietary pattern, although there may be some specific behaviors that could be improved.   Nutrition Goals Re-Evaluation:  Nutrition Goals Re-Evaluation    [place] Name 09/09/20 1620             Goals   Nutrition Goal LT: weight increasing would like to maintain, improve breathing 7/10 now - when bad - 1-2 x/week sometimes after meals, rebuild microbiome       Comment She has met with dietitian is aware of changes to to make.  She is trying to balance her fruits and vegetables with what she is able to eat and what are low in histimaine responses.  She wants to get back to the store again but trying to find the best time to go.  She really wants to get back [employer] which is local and can help.       Expected Outcome Short: Continue to add in fruits and vegetables  Long: Continue to make changes to help.              Nutrition Goals Discharge (Final Nutrition Goals Re-Evaluation):  Nutrition Goals Re-Evaluation - 09/09/20 1620      Goals   Nutrition Goal LT: weight increasing  would like to maintain, improve breathing 7/10 now - when bad - 1-2 x/week sometimes after meals, rebuild microbiome    Comment She has met with dietitian is aware of changes to to make.  She is trying to balance her fruits and vegetables with what she is able to eat and what are low in histimaine responses.  She wants to get back to the store again but trying to find the best  time to go.  She really wants to get back [employer] which is local and can help.    Expected Outcome Short: Continue to add in fruits and vegetables  Long: Continue to make changes to help.           Psychosocial: Target Goals: Acknowledge presence or absence of significant depression and/or stress, maximize coping skills, provide positive support system. Participant is able to verbalize types and ability to use techniques and skills needed for reducing stress and depression.   Education: Stress, Anxiety, and Depression - Group verbal and visual presentation to define topics covered.  Reviews how body is impacted by stress, anxiety, and depression.  Also discusses healthy ways to reduce stress and to treat/manage anxiety and depression.  Written material given at graduation.   Education: Sleep Hygiene -Provides group verbal and written instruction about how sleep can affect your health.  Define sleep hygiene, discuss sleep cycles and impact of sleep habits. Review good sleep hygiene tips.    Initial Review & Psychosocial Screening:  Initial Psych Review & Screening - 07/29/20 1141      Initial Review   Current issues with Current Stress Concerns    Source of Stress Concerns Unable to perform yard/household activities    Comments Since COVID has been ongoing she deals with the daily stress of that. She has had Covid and has been dealing with some side effects. She has been more short of breath recently and needs some conditioning to help with her shortness of breath. She feels optimisitc about her health and wants to get better with exercise.      Family Dynamics   Good Support System? Yes    Comments She can look to her husband, dog, three daughters and co-workers for support.      Barriers   Psychosocial barriers to participate in program The patient should benefit from training in stress management and relaxation.      Screening Interventions   Interventions To provide  support and resources with identified psychosocial needs;Provide feedback about the scores to participant;Encouraged to exercise    Expected Outcomes Short Term goal: Utilizing psychosocial counselor, staff and physician to assist with identification of specific Stressors or current issues interfering with healing process. Setting desired goal for each stressor or current issue identified.;Long Term Goal: Stressors or current issues are controlled or eliminated.;Short Term goal: Identification and review with participant of any Quality of Life or Depression concerns found by scoring the questionnaire.;Long Term goal: The participant improves quality of Life and PHQ9 Scores as seen by post scores and/or verbalization of changes           Quality of Life Scores:  Scores of 19 and below usually indicate a poorer quality of life in these areas.  A difference of  2-3 points is a clinically meaningful difference.  A difference of 2-3 points in the total score of the Quality of Life Index has been associated with significant improvement in overall quality of life, self-image, physical symptoms, and general health in studies assessing  change in quality of life.  PHQ-9: Recent Review Flowsheet Data    Depression screen [facility] 2/9 07/29/2020 02/08/2020 03/28/2015   Decreased Interest 0 0 0   Down, Depressed, Hopeless 0 0 0   PHQ - 2 Score 0 0 0   Altered sleeping 0 - -   Tired, decreased energy 3  - -   Change in appetite 0 - -   Feeling bad or failure about yourself  0 - -   Trouble concentrating 0 - -   Moving slowly or fidgety/restless 0 - -   Suicidal thoughts 0 - -   PHQ-9 Score 3 - -   Difficult doing work/chores Not difficult at all - -     Interpretation of Total Score  Total Score Depression Severity:  1-4 = Minimal depression, 5-9 = Mild depression, 10-14 = Moderate depression, 15-19 = Moderately severe depression, 20-27 = Severe depression   Psychosocial Evaluation and Intervention:   Psychosocial Evaluation - 07/29/20 1147      Psychosocial Evaluation & Interventions   Interventions Encouraged to exercise with the program and follow exercise prescription;Stress management education;Relaxation education    Comments Since COVID has been ongoing she deals with the daily stress of that. She has had Covid and has been dealing with some side effects. She has been more short of breath recently and needs some conditioning to help with her shortness of breath. She feels optimisitc about her health and wants to get better with exercise.    Expected Outcomes Short: Attend [facility] to improve move. Long: maintain a positive outllok on health and graduate [facility].    Continue Psychosocial Services  Follow up required by staff           Psychosocial Re-Evaluation:  Psychosocial Re-Evaluation    [place] Name 08/22/20 1437 09/09/20 1614           Psychosocial Re-Evaluation   Current issues with Current Stress Concerns Current Stress Concerns      Comments She gets stressed with not being able to breath at times and gets short of breath. Rachel Fry has a positive outlook on her health and wants to get healthier. Rachel Fry is doing well mentally.  We made it through [place] which is a weight off her shoulders.  She still gets frustrated with not being able to do what she wants. Since COVID, she is sleeping better than ever.   She still gets up some at night, but is able to get back to sleep easily.  She continues to work on her breathing.  Her biggest stressor is still not being able to see her grandkids and hug them.  It has been over 3 years since she saw them.      Expected Outcomes Short: continue to exercise to improve shortness of breath and mood. Long: maintain exercise post [facility] to keep stress at a minimum. Short: Continue to work on breathing and stamina  Long; Continue to talk to girls over the phone.      Interventions Encouraged to attend Pulmonary Rehabilitation for the exercise  Encouraged to attend Pulmonary Rehabilitation for the exercise      Continue Psychosocial Services  Follow up required by staff Follow up required by staff        Initial Review   Source of Stress Concerns -- Unable to perform yard/household activities             Psychosocial Discharge (Final Psychosocial Re-Evaluation):  Psychosocial Re-Evaluation - 09/09/20 1614  Psychosocial Re-Evaluation   Current issues with Current Stress Concerns    Comments Rachel Fry is doing well mentally.  We made it through [place] which is a weight off her shoulders.  She still gets frustrated with not being able to do what she wants. Since COVID, she is sleeping better than ever.   She still gets up some at night, but is able to get back to sleep easily.  She continues to work on her breathing.  Her biggest stressor is still not being able to see her grandkids and hug them.  It has been over 3 years since she saw them.    Expected Outcomes Short: Continue to work on breathing and stamina  Long; Continue to talk to girls over the phone.    Interventions Encouraged to attend Pulmonary Rehabilitation for the exercise    Continue Psychosocial Services  Follow up required by staff      Initial Review   Source of Stress Concerns Unable to perform yard/household activities           Education: Education Goals: Education classes will be provided on a weekly basis, covering required topics. Participant will state understanding/return demonstration of topics presented.  Learning Barriers/Preferences:  Learning Barriers/Preferences - 07/29/20 1139      Learning Barriers/Preferences   Learning Barriers Sight    Learning Preferences None           General Pulmonary Education Topics:  Infection Prevention: - Provides verbal and written material to individual with discussion of infection control including proper hand washing and proper equipment cleaning during exercise session.   Pulmonary Rehab  from 09/04/2020 in [facility]iac and Pulmonary Rehab  Date 07/29/20  Educator [facility]  Instruction Review Code 1- Verbalizes Understanding      Falls Prevention: - Provides verbal and written material to individual with discussion of falls prevention and safety.   Pulmonary Rehab from 09/04/2020 in [facility] Cardiac and Pulmonary Rehab  Date 07/29/20  Educator [facility]  Instruction Review Code 1- Verbalizes Understanding      Chronic Lung Disease Review: - Group verbal instruction with posters, models, PowerPoint presentations and videos,  to review new updates, new respiratory medications, new advancements in procedures and treatments. Providing information on websites and "800" numbers for continued self-education. Includes information about supplement oxygen, available portable oxygen systems, continuous and intermittent flow rates, oxygen safety, concentrators, and Medicare reimbursement for oxygen. Explanation of Pulmonary Drugs, including class, frequency, complications, importance of spacers, rinsing mouth after steroid MDI's, and proper cleaning methods for nebulizers. Review of basic lung anatomy and physiology related to function, structure, and complications of lung disease. Review of risk factors. Discussion about methods for diagnosing sleep apnea and types of masks and machines for OSA. Includes a review of the use of types of environmental controls: home humidity, furnaces, filters, dust mite/pet prevention, HEPA vacuums. Discussion about weather changes, air quality and the benefits of nasal washing. Instruction on Warning signs, infection symptoms, calling MD promptly, preventive modes, and value of vaccinations. Review of effective airway clearance, coughing and/or vibration techniques. Emphasizing that all should Create an Action Plan. Written material given at graduation.   AED/CPR: - Group verbal and written instruction with the use of models to demonstrate the basic use of the AED with the  basic ABC's of resuscitation.    Anatomy and Cardiac Procedures: - Group verbal and visual presentation and models provide information about basic cardiac anatomy and function. Reviews the testing methods done to diagnose heart disease  and the outcomes of the test results. Describes the treatment choices: Medical Management, Angioplasty, or Coronary Bypass Surgery for treating various heart conditions including Myocardial Infarction, Angina, Valve Disease, and Cardiac Arrhythmias.  Written material given at graduation.   Medication Safety: - Group verbal and visual instruction to review commonly prescribed medications for heart and lung disease. Reviews the medication, class of the drug, and side effects. Includes the steps to properly store meds and maintain the prescription regimen.  Written material given at graduation.   Other: -Provides group and verbal instruction on various topics (see comments)   Knowledge Questionnaire Score:  Knowledge Questionnaire Score - 07/29/20 1631      Knowledge Questionnaire Score   Pre Score 18/18            Core Components/Risk Factors/Patient Goals at Admission:  Personal Goals and Risk Factors at Admission - 07/29/20 1634      Core Components/Risk Factors/Patient Goals on Admission    Weight Management Yes;Weight Loss    Intervention Weight Management: Develop a combined nutrition and exercise program designed to reach desired caloric intake, while maintaining appropriate intake of nutrient and fiber, sodium and fats, and appropriate energy expenditure required for the weight goal.;Weight Management: Provide education and appropriate resources to help participant work on and attain dietary goals.;Weight Management/Obesity: Establish reasonable short term and long term weight goals.    Admit Weight 180 lb 3.2 oz (81.7 kg)    Goal Weight: Short Term 175 lb (79.4 kg)    Goal Weight: Long Term 170 lb (77.1 kg)    Expected Outcomes Short Term:  Continue to assess and modify interventions until short term weight is achieved;Long Term: Adherence to nutrition and physical activity/exercise program aimed toward attainment of established weight goal;Weight Maintenance: Understanding of the daily nutrition guidelines, which includes 25-35% calories from fat, 7% or less cal from saturated fats, less than 268m cholesterol, less than 1.5gm of sodium, & 5 or more servings of fruits and vegetables daily;Weight Loss: Understanding of general recommendations for a balanced deficit meal plan, which promotes 1-2 lb weight loss per week and includes a negative energy balance of [id number] kcal/d;Understanding recommendations for meals to include 15-35% energy as protein, 25-35% energy from fat, 35-60% energy from carbohydrates, less than 2070mof dietary cholesterol, 20-35 gm of total fiber daily;Understanding of distribution of calorie intake throughout the day with the consumption of 4-5 meals/snacks    Improve shortness of breath with ADL's Yes    Intervention Provide education, individualized exercise plan and daily activity instruction to help decrease symptoms of SOB with activities of daily living.    Expected Outcomes Short Term: Improve cardiorespiratory fitness to achieve a reduction of symptoms when performing ADLs;Long Term: Be able to perform more ADLs without symptoms or delay the onset of symptoms    Hypertension Yes    Intervention Provide education on lifestyle modifcations including regular physical activity/exercise, weight management, moderate sodium restriction and increased consumption of fresh fruit, vegetables, and low fat dairy, alcohol moderation, and smoking cessation.;Monitor prescription use compliance.    Expected Outcomes Short Term: Continued assessment and intervention until BP is < 140/9021mG in hypertensive participants. < 130/70m31m in hypertensive participants with diabetes, heart failure or chronic kidney disease.;Long Term:  Maintenance of blood pressure at goal levels.    Lipids Yes    Intervention Provide education and support for participant on nutrition & aerobic/resistive exercise along with prescribed medications to achieve LDL <70mg12mL >40mg.22mExpected Outcomes  Short Term: Participant states understanding of desired cholesterol values and is compliant with medications prescribed. Participant is following exercise prescription and nutrition guidelines.;Long Term: Cholesterol controlled with medications as prescribed, with individualized exercise RX and with personalized nutrition plan. Value goals: LDL < 49m, HDL > 40 mg.           Education:Diabetes - Individual verbal and written instruction to review signs/symptoms of diabetes, desired ranges of glucose level fasting, after meals and with exercise. Acknowledge that pre and post exercise glucose checks will be done for 3 sessions at entry of program.   Know Your Numbers and Heart Failure: - Group verbal and visual instruction to discuss disease risk factors for cardiac and pulmonary disease and treatment options.  Reviews associated critical values for Overweight/Obesity, Hypertension, Cholesterol, and Diabetes.  Discusses basics of heart failure: signs/symptoms and treatments.  Introduces Heart Failure Zone chart for action plan for heart failure.  Written material given at graduation.   Core Components/Risk Factors/Patient Goals Review:   Goals and Risk Factor Review    [place] Name 08/22/20 1429 09/09/20 1617           Core Components/Risk Factors/Patient Goals Review   Personal Goals Review Weight Management/Obesity;Improve shortness of breath with ADL's Weight Management/Obesity;Improve shortness of breath with ADL's;Hypertension      Review If sue gets up at night and walks up the three steps to go to the bathroom she will get short of breath. She is going to try to sit up first take a few deep breaths then get up. Work on PLB while doing  strenous activities. SCollie Siadis maintaining her weight.  She is usually up and down 3-4 lbs.  The predinisone is not helping and she is salt sensitive.  She has tried to start taking her Lasix a little more frequently to help.  Her breathing is starting to improve, but it is still there and come on strong.  Her blood pressures are doing better but are still high in doctor's office 140/92, but in class they are doing well.      Expected Outcomes Short: Work on PLB when doing ADL's. Long: maintain breathing techniques at home independently. Short: Continue to work on [facility]; Continue to improve breathing.             Core Components/Risk Factors/Patient Goals at Discharge (Final Review):   Goals and Risk Factor Review - 09/09/20 1617      Core Components/Risk Factors/Patient Goals Review   Personal Goals Review Weight Management/Obesity;Improve shortness of breath with ADL's;Hypertension    Review SCollie Siadis maintaining her weight.  She is usually up and down 3-4 lbs.  The predinisone is not helping and she is salt sensitive.  She has tried to start taking her Lasix a little more frequently to help.  Her breathing is starting to improve, but it is still there and come on strong.  Her blood pressures are doing better but are still high in doctor's office 140/92, but in class they are doing well.    Expected Outcomes Short: Continue to work on [facility]; Continue to improve breathing.           ITP Comments:  ITP Comments    Row Name 07/29/20 1159 07/29/20 1634 07/31/20 0733 08/05/20 1525 08/07/20 1620   ITP Comments In person Visit completed. Patient informed on EP and RD appointment and 6 Minute walk test. Patient also informed of patient health questionnaires on My Chart. Patient Verbalizes understanding.  Visit diagnosis can be found in [facility] 07/19/2020. Completed 6MWT and gym orientation. Initial ITP created and sent for review to Dr. Emily Filbert, Medical Director. 30 day  review completed. ITP sent to Dr. Emily Filbert, Medical Director of Cardiac and Pulmonary Rehab. Continue with ITP unless changes are made by physician.  Patient is new to program. Completed Initial RD Evaluation First full day of exercise!  Patient was oriented to gym and equipment including functions, settings, policies, and procedures.  Patient's individual exercise prescription and treatment plan were reviewed.  All starting workloads were established based on the results of the 6 minute walk test done at initial orientation visit.  The plan for exercise progression was also introduced and progression will be customized based on patient's performance and goals.   [place] Name 08/28/20 0749 09/25/20 1002         ITP Comments 30 day review completed. ITP sent to Dr. Emily Filbert, Medical Director of Cardiac and Pulmonary Rehab. Continue with ITP unless changes are made by physician. 30 Day review completed. Medical Director ITP review done, changes made as directed, and signed approval by Medical Director.             Comments:   30 Day review completed. Medical Director ITP review done, changes made as directed, and signed approval by Medical Director.

## 2020-09-30 ENCOUNTER — Encounter: Payer: 59 | Attending: Pulmonary Disease

## 2020-09-30 ENCOUNTER — Other Ambulatory Visit: Payer: Self-pay

## 2020-09-30 DIAGNOSIS — R0600 Dyspnea, unspecified: Secondary | ICD-10-CM | POA: Diagnosis not present

## 2020-09-30 NOTE — Progress Notes (Signed)
Daily Session Note  Patient Details  Name: Rachel Fry MRN: [id number] Date of Birth: 21-Jan-1956 Referring Provider:     Pulmonary Rehab from 07/29/2020 in [facility] Cardiac and Pulmonary Rehab  Referring Provider Praveen      Encounter Date: 09/30/2020  Check In:  Session Check In - 09/30/20 1608      Check-In   Supervising physician immediately available to respond to emergencies See telemetry face sheet for immediately available ER MD    Location [facility]-Cardiac & Pulmonary Rehab    Staff Present Birdie Sons, MPA, Mauricia Area, BS, ACSM CEP, Exercise Physiologist;Kara Eliezer Bottom, MS Exercise Physiologist    Virtual Visit No    Medication changes reported     No    Fall or balance concerns reported    No    Warm-up and Cool-down Performed on first and last piece of equipment    Resistance Training Performed Yes    VAD Patient? No    PAD/SET Patient? No      Pain Assessment   Currently in Pain? No/denies              Social History   Tobacco Use  Smoking Status Never Smoker  Smokeless Tobacco Never Used    Goals Met:  Independence with exercise equipment Exercise tolerated well No report of cardiac concerns or symptoms Strength training completed today  Goals Unmet:  Not Applicable  Comments: Pt able to follow exercise prescription today without complaint.  Will continue to monitor for progression.    Dr. Emily Filbert is Medical Director for [place] and [facility].

## 2020-10-02 ENCOUNTER — Other Ambulatory Visit: Payer: Self-pay

## 2020-10-02 DIAGNOSIS — R0600 Dyspnea, unspecified: Secondary | ICD-10-CM

## 2020-10-02 NOTE — Progress Notes (Signed)
Daily Session Note  Patient Details  Name: Rachel Fry MRN: [id number] Date of Birth: 03/03/1956 Referring Provider:     Pulmonary Rehab from 07/29/2020 in [facility] Cardiac and Pulmonary Rehab  Referring Provider Praveen      Encounter Date: 10/02/2020  Check In:  Session Check In - 10/02/20 1657      Check-In   Supervising physician immediately available to respond to emergencies See telemetry face sheet for immediately available ER MD    Location [facility]-Cardiac & Pulmonary Rehab    Staff Present Birdie Sons, MPA, Nino Glow, MS Exercise Physiologist    Virtual Visit No    Medication changes reported     No    Fall or balance concerns reported    No    Warm-up and Cool-down Performed on first and last piece of equipment    Resistance Training Performed Yes    VAD Patient? No    PAD/SET Patient? No      Pain Assessment   Currently in Pain? No/denies              Social History   Tobacco Use  Smoking Status Never Smoker  Smokeless Tobacco Never Used    Goals Met:  Independence with exercise equipment Exercise tolerated well No report of cardiac concerns or symptoms Strength training completed today  Goals Unmet:  Not Applicable  Comments: Pt able to follow exercise prescription today without complaint.  Will continue to monitor for progression.    Dr. Emily Filbert is Medical Director for [place] and [facility].

## 2020-10-03 MED FILL — XOLAIR 150 MG SOLR: 150 | 28 days supply | Qty: 2 | Fill #3

## 2020-10-04 MED FILL — BOTOX 200 UNITS VIAL: 200 | 90 days supply | Qty: 1 | Fill #2

## 2020-10-07 ENCOUNTER — Other Ambulatory Visit: Payer: Self-pay

## 2020-10-07 DIAGNOSIS — R0600 Dyspnea, unspecified: Secondary | ICD-10-CM

## 2020-10-07 NOTE — Progress Notes (Signed)
Daily Session Note  Patient Details  Name: Rachel Fry MRN: [id number] Date of Birth: 05/26/1956 Referring Provider:   Flowsheet Row Pulmonary Rehab from 07/29/2020 in [facility] Cardiac and Pulmonary Rehab  Referring Provider Praveen      Encounter Date: 10/07/2020  Check In:  Session Check In - 10/07/20 1621      Check-In   Supervising physician immediately available to respond to emergencies See telemetry face sheet for immediately available ER MD    Location [facility]-Cardiac & Pulmonary Rehab    Staff Present Birdie Sons, MPA, Mauricia Area, BS, ACSM CEP, Exercise Physiologist;Kara Eliezer Bottom, MS Exercise Physiologist    Virtual Visit No    Medication changes reported     No    Fall or balance concerns reported    No    Warm-up and Cool-down Performed on first and last piece of equipment    Resistance Training Performed Yes    VAD Patient? No    PAD/SET Patient? No      Pain Assessment   Currently in Pain? No/denies              Social History   Tobacco Use  Smoking Status Never Smoker  Smokeless Tobacco Never Used    Goals Met:  Independence with exercise equipment Exercise tolerated well No report of cardiac concerns or symptoms Strength training completed today  Goals Unmet:  Not Applicable  Comments: Pt able to follow exercise prescription today without complaint.  Will continue to monitor for progression.    Dr. Emily Filbert is Medical Director for [facility] and [facility].

## 2020-10-08 ENCOUNTER — Ambulatory Visit (INDEPENDENT_AMBULATORY_CARE_PROVIDER_SITE_OTHER): Payer: 59 | Admitting: *Deleted

## 2020-10-08 DIAGNOSIS — L501 Idiopathic urticaria: Secondary | ICD-10-CM | POA: Diagnosis not present

## 2020-10-10 ENCOUNTER — Encounter: Payer: 59 | Admitting: *Deleted

## 2020-10-10 ENCOUNTER — Other Ambulatory Visit: Payer: Self-pay

## 2020-10-10 DIAGNOSIS — R0600 Dyspnea, unspecified: Secondary | ICD-10-CM | POA: Diagnosis not present

## 2020-10-10 MED FILL — AIMOVIG 140 MG/ML SOAJ: 140 | 30 days supply | Qty: 1 | Fill #3

## 2020-10-10 NOTE — Progress Notes (Signed)
Daily Session Note  Patient Details  Name: Rachel Fry MRN: [id number] Date of Birth: 1956/07/30 Referring Provider:   Flowsheet Row Pulmonary Rehab from 07/29/2020 in [facility] Cardiac and Pulmonary Rehab  Referring Provider Praveen      Encounter Date: 10/10/2020  Check In:  Session Check In - 10/10/20 [place]      Check-In   Supervising physician immediately available to respond to emergencies See telemetry face sheet for immediately available ER MD    Location [facility]-Cardiac & Pulmonary Rehab    Staff Present Renita Papa, RN BSN;Joseph [address] [place], [place], [place], CCRP, CCET    Virtual Visit No    Medication changes reported     No    Fall or balance concerns reported    No    Warm-up and Cool-down Performed on first and last piece of equipment    Resistance Training Performed Yes    VAD Patient? No    PAD/SET Patient? No      Pain Assessment   Currently in Pain? No/denies              Social History   Tobacco Use  Smoking Status Never Smoker  Smokeless Tobacco Never Used    Goals Met:  Independence with exercise equipment Exercise tolerated well No report of cardiac concerns or symptoms Strength training completed today  Goals Unmet:  Not Applicable  Comments: Pt able to follow exercise prescription today without complaint.  Will continue to monitor for progression.    Dr. Emily Filbert is Medical Director for [place] and [facility].

## 2020-10-10 NOTE — Progress Notes (Signed)
Oh gotcha. Do you need those articles or do you have it?   Salvatore Marvel, MD Allergy and [place] [facility] [place]

## 2020-10-11 ENCOUNTER — Telehealth: Payer: Self-pay | Admitting: Allergy & Immunology

## 2020-10-11 MED FILL — FROVATRIPTAN SUCC 2.5 MG TA: 2.5 | 30 days supply | Qty: 18 | Fill #1

## 2020-10-11 MED FILL — NURTEC 75 MG TBDP: 75 | 30 days supply | Qty: 8 | Fill #5

## 2020-10-11 NOTE — Telephone Encounter (Signed)
Must stop the Zyrtec.  We can start Allegra 2 tablets in the morning and 2 tablets at night.  We can also add famotidine 20 mg twice daily.  I talked to Tammy about doing every 2 weeks.  She said that they denied it, but we are going to submit an appeal.  It is taking a little longer than reintubated.  Salvatore Marvel, MD All[facility] and [facility] [place]

## 2020-10-11 NOTE — Telephone Encounter (Signed)
Called and spoke with the patient and advised. Patient verbalized understanding.  

## 2020-10-11 NOTE — Telephone Encounter (Signed)
Patient called asking if she was able to get her Xolair every 2 week now instead of 4 weeks, after speaking with Ashleigh, patient was informed to keep her 4 week appointment for now until shipping can be confirmed and patient cannot get botox injection and Xolair in the same day. Patient verbalized understand, but states she has been itching a lot lately and would like to know what she can do. Patient has been taking Zyrtec for about a week twice a day and it does not help, it may be making it worse.  Please advise.

## 2020-10-11 NOTE — Telephone Encounter (Signed)
Please advise to pts itching

## 2020-10-12 ENCOUNTER — Encounter: Payer: Self-pay | Admitting: Allergy & Immunology

## 2020-10-14 ENCOUNTER — Other Ambulatory Visit: Payer: Self-pay

## 2020-10-14 DIAGNOSIS — R0600 Dyspnea, unspecified: Secondary | ICD-10-CM | POA: Diagnosis not present

## 2020-10-14 MED FILL — FLOVENT HFA 110 MCG INHALER: 110 | 90 days supply | Qty: 36 | Fill #3

## 2020-10-14 MED FILL — VIT D2 1.25 MG (50,000 UNIT: 1.25 MG | 84 days supply | Qty: 12 | Fill #1

## 2020-10-14 MED FILL — CloNIDine HCL 0.1 MG TAB: 0.1 | 90 days supply | Qty: 180 | Fill #1

## 2020-10-14 MED FILL — LEVOTHYROXINE 50 MCG TABLET: 50 | 90 days supply | Qty: 90 | Fill #0

## 2020-10-14 MED FILL — SPIRONOLACTONE 25 MG TABS: 25 | 90 days supply | Qty: 180 | Fill #0

## 2020-10-14 NOTE — Progress Notes (Signed)
Daily Session Note  Patient Details  Name: Rachel Fry MRN: [id number] Date of Birth: 12-20-1955 Referring Provider:   Flowsheet Row Pulmonary Rehab from 07/29/2020 in [facility] Cardiac and Pulmonary Rehab  Referring Provider Praveen      Encounter Date: 10/14/2020  Check In:  Session Check In - 10/14/20 1602      Check-In   Supervising physician immediately available to respond to emergencies See telemetry face sheet for immediately available ER MD    Location [facility]-Cardiac & Pulmonary Rehab    Staff Present Birdie Sons, MPA, Mauricia Area, BS, ACSM CEP, Exercise Physiologist;Amanda Oletta Darter, BA, ACSM CEP, Exercise Physiologist;Kara Eliezer Bottom, MS Exercise Physiologist    Virtual Visit No    Medication changes reported     No    Fall or balance concerns reported    No    Warm-up and Cool-down Performed on first and last piece of equipment    Resistance Training Performed Yes    VAD Patient? No    PAD/SET Patient? No      Pain Assessment   Currently in Pain? No/denies              Social History   Tobacco Use  Smoking Status Never Smoker  Smokeless Tobacco Never Used    Goals Met:  Independence with exercise equipment Exercise tolerated well No report of cardiac concerns or symptoms Strength training completed today  Goals Unmet:  Not Applicable  Comments: Pt able to follow exercise prescription today without complaint.  Will continue to monitor for progression.    Dr. Emily Filbert is Medical Director for [place] and [facility].

## 2020-10-15 ENCOUNTER — Encounter: Payer: Self-pay | Admitting: Allergy & Immunology

## 2020-10-16 ENCOUNTER — Other Ambulatory Visit: Payer: Self-pay

## 2020-10-16 DIAGNOSIS — R0600 Dyspnea, unspecified: Secondary | ICD-10-CM | POA: Diagnosis not present

## 2020-10-16 NOTE — Progress Notes (Signed)
Daily Session Note  Patient Details  Name: KATURAH KARAPETIAN MRN: [id number] Date of Birth: [birth year] Referring Provider:   Flowsheet Row Pulmonary Rehab from 07/29/2020 in [facility] Cardiac and Pulmonary Rehab  Referring Provider Praveen      Encounter Date: 10/16/2020  Check In:  Session Check In - 10/16/20 1437      Check-In   Supervising physician immediately available to respond to emergencies See telemetry face sheet for immediately available ER MD    Location [facility]-Cardiac & Pulmonary Rehab    Staff Present Birdie Sons, MPA, RN;Joseph Darrin Nipper, [place], RCEP, CCRP, CCET    Virtual Visit No    Medication changes reported     No    Fall or balance concerns reported    No    Warm-up and Cool-down Performed on first and last piece of equipment    Resistance Training Performed Yes    VAD Patient? No    PAD/SET Patient? No      Pain Assessment   Currently in Pain? No/denies              Social History   Tobacco Use  Smoking Status Never Smoker  Smokeless Tobacco Never Used    Goals Met:  Independence with exercise equipment Exercise tolerated well No report of cardiac concerns or symptoms Strength training completed today  Goals Unmet:  Not Applicable  Comments: Pt able to follow exercise prescription today without complaint.  Will continue to monitor for progression.    Dr. Emily Filbert is Medical Director for [place] and [facility].

## 2020-10-21 ENCOUNTER — Other Ambulatory Visit: Payer: Self-pay

## 2020-10-21 ENCOUNTER — Encounter: Payer: 59 | Admitting: *Deleted

## 2020-10-21 DIAGNOSIS — R0600 Dyspnea, unspecified: Secondary | ICD-10-CM | POA: Diagnosis not present

## 2020-10-21 MED FILL — predniSONE 5 MG TABS: 5 | 30 days supply | Qty: 30 | Fill #3

## 2020-10-21 NOTE — Progress Notes (Signed)
Daily Session Note  Patient Details  Name: Rachel Fry MRN: [id number] Date of Birth: 25-Nov-1955 Referring Provider:   Flowsheet Row Pulmonary Rehab from 07/29/2020 in [facility] Cardiac and Pulmonary Rehab  Referring Provider Praveen      Encounter Date: 10/21/2020  Check In:  Session Check In - 10/21/20 1530      Check-In   Supervising physician immediately available to respond to emergencies See telemetry face sheet for immediately available ER MD    Location [facility]-Cardiac & Pulmonary Rehab    Staff Present Renita Papa, RN BSN;Laureen Owens Shark, BS, RRT, CPFT;Amanda Oletta Darter, BA, ACSM CEP, Exercise Physiologist    Virtual Visit No    Medication changes reported     No    Fall or balance concerns reported    No    Warm-up and Cool-down Performed on first and last piece of equipment    Resistance Training Performed Yes    VAD Patient? No    PAD/SET Patient? No      Pain Assessment   Currently in Pain? No/denies              Social History   Tobacco Use  Smoking Status Never Smoker  Smokeless Tobacco Never Used    Goals Met:  Independence with exercise equipment Exercise tolerated well No report of cardiac concerns or symptoms Strength training completed today  Goals Unmet:  Not Applicable  Comments: Pt able to follow exercise prescription today without complaint.  Will continue to monitor for progression.    Dr. Emily Filbert is Medical Director for [place] and [facility].

## 2020-10-22 ENCOUNTER — Ambulatory Visit: Payer: Self-pay

## 2020-10-22 DIAGNOSIS — G43719 Chronic migraine without aura, intractable, without status migrainosus: Secondary | ICD-10-CM | POA: Diagnosis not present

## 2020-10-23 DIAGNOSIS — R0600 Dyspnea, unspecified: Secondary | ICD-10-CM

## 2020-10-23 NOTE — Progress Notes (Signed)
Pulmonary Individual Treatment Plan  Patient Details  Name: Rachel Fry MRN: [id number] Date of Birth: [birth year] Referring Provider:   Flowsheet Row Pulmonary Rehab from 07/29/2020 in [facility] Cardiac and Pulmonary Rehab  Referring Provider Praveen      Initial Encounter Date:  Flowsheet Row Pulmonary Rehab from 07/29/2020 in [facility] Cardiac and Pulmonary Rehab  Date 07/29/20      Visit Diagnosis: Dyspnea, unspecified type  Patient's Home Medications on Admission:  Current Outpatient Medications:  .  albuterol (VENTOLIN HFA) 108 (90 Base) MCG/ACT inhaler, Inhale 2 puffs into the lungs every 4 (four) hours as needed for wheezing or shortness of breath. , Disp: , Rfl:  .  atorvastatin (LIPITOR) 20 MG tablet, TAKE 1 TABLET (20 MG TOTAL) BY MOUTH DAILY. (Patient taking differently: Take 20 mg by mouth at bedtime. ), Disp: 90 tablet, Rfl: 3 .  b complex vitamins capsule, Take 1 capsule by mouth daily., Disp: , Rfl:  .  Bacillus Coagulans-Inulin (PROBIOTIC) 1-250 BILLION-MG CAPS, Probiotic  Take 1 cap po daily, Disp: , Rfl:  .  botulinum toxin Type A (BOTOX) 100 UNITS SOLR injection, Botox - Historical Medication  once every 12 weeks for migraines Active, Disp: , Rfl:  .  cetirizine (ZYRTEC) 10 MG tablet, Take 10 mg by mouth every evening. , Disp: , Rfl:  .  cloNIDine (CATAPRES) 0.1 MG tablet, Take 1 tablet (0.1 mg total) by mouth 2 (two) times daily., Disp: 60 tablet, Rfl:  .  EPINEPHrine 0.3 mg/0.3 mL IJ SOAJ injection, Inject 0.3 mg into the muscle as needed for anaphylaxis. , Disp: , Rfl: 1 .  Erenumab-aooe (AIMOVIG) 70 MG/ML SOAJ, Inject 70 mg into the skin every 30 (thirty) days. , Disp: , Rfl:  .  ezetimibe (ZETIA) 10 MG tablet, Take 10 mg by mouth every evening. , Disp: , Rfl:  .  famotidine (PEPCID) 20 MG tablet, Take 20 mg by mouth 2 (two) times daily., Disp: , Rfl:  .  fluticasone (FLOVENT HFA) 110 MCG/ACT inhaler, Inhale 2 puffs into the lungs 2 (two) times daily. , Disp: , Rfl:  .   frovatriptan (FROVA) 2.5 MG tablet, Take 2.5 mg by mouth 2 (two) times daily as needed for migraine. Max of 3 doses per 7 days, Disp: , Rfl:  .  furosemide (LASIX) 20 MG tablet, Take 40 mg by mouth daily as needed for edema. , Disp: , Rfl:  .  levothyroxine (SYNTHROID, LEVOTHROID) 50 MCG tablet, Take 50 mcg by mouth daily before breakfast. , Disp: , Rfl: 5 .  Menaquinone-7 (VITAMIN K2 PO), Take by mouth., Disp: , Rfl:  .  omalizumab (XOLAIR) 150 MG injection, Inject 300 mg into the skin every 28 (twenty-eight) days., Disp: 2 each, Rfl: 11 .  Polyethyl Glycol-Propyl Glycol (SYSTANE OP), Place 1-2 drops into both eyes 2 (two) times daily., Disp: , Rfl:  .  predniSONE (DELTASONE) 5 MG tablet, Take 5 mg by mouth daily., Disp: , Rfl:  .  Rimegepant Sulfate (NURTEC) 75 MG TBDP, Take 75 mg by mouth daily as needed (migraines). Max 15 doses per 30 days, Disp: , Rfl:  .  rizatriptan (MAXALT) 10 MG tablet, Take 10 mg by mouth 2 (two) times daily as needed for migraine. Max doses of 3 doses per 7 days, Disp: , Rfl:  .  spironolactone (ALDACTONE) 25 MG tablet, Take 50 mg by mouth daily. , Disp: , Rfl:  .  triamcinolone (NASACORT ALLERGY 24HR) 55 MCG/ACT AERO nasal inhaler, Place 2 sprays  into the nose at bedtime., Disp: , Rfl:  .  Vitamin D, Ergocalciferol, (DRISDOL) 1.25 MG (50000 UT) CAPS capsule, Take 50,000 Units by mouth every Sunday. , Disp: , Rfl: 3 .  zafirlukast (ACCOLATE) 10 MG tablet, Take 10 mg by mouth 2 (two) times daily. , Disp: , Rfl:   Current Facility-Administered Medications:  .  omalizumab Arvid Right) injection 300 mg, 300 mg, Subcutaneous, Q28 days, Valentina Shaggy, MD, 300 mg at 10/08/20 6010  Past Medical History: Past Medical History:  Diagnosis Date  . Allergy   . Asthma   . History of kidney stones   . Hypothyroidism   . Migraines   . Numbness and tingling    left side of body, occasionally right side   . S/P Botox injection     Tobacco Use: Social History   Tobacco  Use  Smoking Status Never Smoker  Smokeless Tobacco Never Used    Labs: Recent Review Flowsheet Data    Labs for ITP Cardiac and Pulmonary Rehab Latest Ref Rng & Units 03/30/2014 07/24/2014 05/03/2015 12/10/2016 09/26/2019   Cholestrol 100 - 199 mg/dL 214(A) 177 - 201(H) -   LDLCALC 0 - 99 mg/dL 140 115(H) - 123(H) -   HDL >39 mg/dL 59 46 - 66 59   Trlycerides 0 - 149 mg/dL 74 81 - 61 116   Hemoglobin A1c 4.8 - 5.6 % 5.9 - 6.0(H) 5.4 -       Pulmonary Assessment Scores:  Pulmonary Assessment Scores    Row Name 07/29/20 1630         ADL UCSD   SOB Score total 45     Rest 1     Walk 3     Stairs 4     Bath 0     Dress 0     Shop 2           CAT Score   CAT Score 16           mMRC Score   mMRC Score 2            UCSD: Self-administered rating of dyspnea associated with activities of daily living (ADLs) 6-point scale (0 = "not at all" to 5 = "maximal or unable to do because of breathlessness")  Scoring Scores range from 0 to 120.  Minimally important difference is 5 units  CAT: CAT can identify the health impairment of COPD patients and is better correlated with disease progression.  CAT has a scoring range of zero to 40. The CAT score is classified into four groups of low (less than 10), medium (10 - 20), high (21-30) and very high (31-40) based on the impact level of disease on health status. A CAT score over 10 suggests significant symptoms.  A worsening CAT score could be explained by an exacerbation, poor medication adherence, poor inhaler technique, or progression of COPD or comorbid conditions.  CAT MCID is 2 points  mMRC: mMRC (Modified Medical Research Council) Dyspnea Scale is used to assess the degree of baseline functional disability in patients of respiratory disease due to dyspnea. No minimal important difference is established. A decrease in score of 1 point or greater is considered a positive change.   Pulmonary Function Assessment:  Pulmonary Function  Assessment - 07/29/20 1138      Breath   Bilateral Breath Sounds Clear    Shortness of Breath Yes;Limiting activity           Exercise Target Goals: Exercise Program  Goal: Individual exercise prescription set using results from initial 6 min walk test and THRR while considering  patient's activity barriers and safety.   Exercise Prescription Goal: Initial exercise prescription builds to 30-45 minutes a day of aerobic activity, 2-3 days per week.  Home exercise guidelines will be given to patient during program as part of exercise prescription that the participant will acknowledge.  Education: Aerobic Exercise: - Group verbal and visual presentation on the components of exercise prescription. Introduces F.I.T.T principle from ACSM for exercise prescriptions.  Reviews F.I.T.T. principles of aerobic exercise including progression. Written material given at graduation.   Education: Resistance Exercise: - Group verbal and visual presentation on the components of exercise prescription. Introduces F.I.T.T principle from ACSM for exercise prescriptions  Reviews F.I.T.T. principles of resistance exercise including progression. Written material given at graduation.    Education: Exercise & Equipment Safety: - Individual verbal instruction and demonstration of equipment use and safety with use of the equipment. Flowsheet Row Pulmonary Rehab from 09/04/2020 in [facility] Cardiac and Pulmonary Rehab  Date 07/29/20  Educator [facility]  Instruction Review Code 1- Verbalizes Understanding      Education: Exercise Physiology & General Exercise Guidelines: - Group verbal and written instruction with models to review the exercise physiology of the cardiovascular system and associated critical values. Provides general exercise guidelines with specific guidelines to those with heart or lung disease.    Education: Flexibility, Balance, Mind/Body Relaxation: - Group verbal and visual presentation with interactive  activity on the components of exercise prescription. Introduces F.I.T.T principle from ACSM for exercise prescriptions. Reviews F.I.T.T. principles of flexibility and balance exercise training including progression. Also discusses the mind body connection.  Reviews various relaxation techniques to help reduce and manage stress (i.e. Deep breathing, progressive muscle relaxation, and visualization). Balance handout provided to take home. Written material given at graduation.   Activity Barriers & Risk Stratification:  Activity Barriers & Cardiac Risk Stratification - 07/29/20 1152      Activity Barriers & Cardiac Risk Stratification   Activity Barriers Shortness of Breath;Balance Concerns;Chest Pain/Angina;Other (comment);Fibromyalgia    Comments Chest pain from Covid.    Cardiac Risk Stratification Low           6 Minute Walk:  6 Minute Walk    Row Name 07/29/20 1622         6 Minute Walk   Phase Initial     Distance 1480 feet     Walk Time 6 minutes     # of Rest Breaks 0     MPH 2.8     METS 3.6     RPE 13     Perceived Dyspnea  2     VO2 Peak 12.6     Symptoms No     Resting HR 81 bpm     Resting BP 134/78     Resting Oxygen Saturation  97 %     Exercise Oxygen Saturation  during 6 min walk 95 %     Max Ex. HR 105 bpm     Max Ex. BP 154/84     2 Minute Post BP 136/84           Interval HR   1 Minute HR 96     2 Minute HR 97     3 Minute HR 102     4 Minute HR 101     5 Minute HR 105     6 Minute HR 104     2  Minute Post HR 88     Interval Heart Rate? Yes           Interval Oxygen   Interval Oxygen? Yes     Baseline Oxygen Saturation % 97 %     1 Minute Oxygen Saturation % 96 %     1 Minute Liters of Oxygen 0 L     2 Minute Oxygen Saturation % 96 %     2 Minute Liters of Oxygen 0 L     3 Minute Oxygen Saturation % 95 %     3 Minute Liters of Oxygen 0 L     4 Minute Oxygen Saturation % 96 %     4 Minute Liters of Oxygen 0 L     5 Minute Oxygen  Saturation % 97 %     5 Minute Liters of Oxygen 0 L     6 Minute Oxygen Saturation % 94 %     6 Minute Liters of Oxygen 0 L     2 Minute Post Oxygen Saturation % 96 %     2 Minute Post Liters of Oxygen 0 L           Oxygen Initial Assessment:  Oxygen Initial Assessment - 07/29/20 1138      Home Oxygen   Home Oxygen Device None    Sleep Oxygen Prescription None    Home Exercise Oxygen Prescription None    Home Resting Oxygen Prescription None      Initial 6 min Walk   Oxygen Used None      Program Oxygen Prescription   Program Oxygen Prescription None      Intervention   Short Term Goals To learn and understand importance of monitoring SPO2 with pulse oximeter and demonstrate accurate use of the pulse oximeter.;To learn and understand importance of maintaining oxygen saturations>88%;To learn and demonstrate proper pursed lip breathing techniques or other breathing techniques.;To learn and demonstrate proper use of respiratory medications    Long  Term Goals Verbalizes importance of monitoring SPO2 with pulse oximeter and return demonstration;Maintenance of O2 saturations>88%;Exhibits proper breathing techniques, such as pursed lip breathing or other method taught during program session;Compliance with respiratory medication;Demonstrates proper use of MDI's           Oxygen Re-Evaluation:  Oxygen Re-Evaluation    Row Name 08/07/20 1620 08/22/20 1424 09/09/20 1623 10/10/20 1542       Program Oxygen Prescription   Program Oxygen Prescription None None None None         Home Oxygen   Home Oxygen Device None None None None    Sleep Oxygen Prescription None None None None    Home Exercise Oxygen Prescription None None None None    Home Resting Oxygen Prescription None None None None    Compliance with Home Oxygen Use -- -- -- No         Goals/Expected Outcomes   Short Term Goals To learn and understand importance of monitoring SPO2 with pulse oximeter and demonstrate  accurate use of the pulse oximeter.;To learn and understand importance of maintaining oxygen saturations>88%;To learn and demonstrate proper pursed lip breathing techniques or other breathing techniques.;To learn and demonstrate proper use of respiratory medications To learn and understand importance of maintaining oxygen saturations>88%;To learn and understand importance of monitoring SPO2 with pulse oximeter and demonstrate accurate use of the pulse oximeter.;To learn and demonstrate proper use of respiratory medications To learn and understand importance of maintaining oxygen saturations>88%;To learn and understand  importance of monitoring SPO2 with pulse oximeter and demonstrate accurate use of the pulse oximeter.;To learn and demonstrate proper use of respiratory medications;To learn and demonstrate proper pursed lip breathing techniques or other breathing techniques. To learn and understand importance of monitoring SPO2 with pulse oximeter and demonstrate accurate use of the pulse oximeter.;To learn and understand importance of maintaining oxygen saturations>88%    Long  Term Goals Verbalizes importance of monitoring SPO2 with pulse oximeter and return demonstration;Maintenance of O2 saturations>88%;Exhibits proper breathing techniques, such as pursed lip breathing or other method taught during program session;Compliance with respiratory medication;Demonstrates proper use of MDI's Maintenance of O2 saturations>88%;Verbalizes importance of monitoring SPO2 with pulse oximeter and return demonstration;Demonstrates proper use of MDI's Maintenance of O2 saturations>88%;Verbalizes importance of monitoring SPO2 with pulse oximeter and return demonstration;Demonstrates proper use of MDI's;Exhibits proper breathing techniques, such as pursed lip breathing or other method taught during program session Maintenance of O2 saturations>88%;Verbalizes importance of monitoring SPO2 with pulse oximeter and return  demonstration    Comments Reviewed PLB technique with pt.  Talked about how it works and it's importance in maintaining their exercise saturations. Collie Siad states that she has been practicing PLB and has been using it. She uses her pulse oximeter at home and knows how to use it. She knows to keep her oxygen above 88 percent. She takes Flovent, Acolate, Zyrtec and Ventolin HFA as needed. She has no questions about her medications. Collie Siad is doing well with her PLB.  She finds that it is helpful for maintaining breath control and is starting to do it more without thinking about it.  She is doing well with her sats and will start to watch her sats with exercise at home. Collie Siad does not have equipment at home to exercise with. She states that she will try to use some equipment at work on her break while monitoring her SpO2. Informed her that staff can giver her guidance of when she can use machines and also encourage her.    Goals/Expected Outcomes Short: Become more profiecient at using PLB.   Long: Become independent at using PLB Short: continue compliance with her medications and program regimine. Long: maintain exercise in [employer] and use breathing techniques. Short: Continue to work on [employer] with exercise at home Long; Continue to exericse to improve breathing. Short: Start monitoring SpO2 while exercising. Long: maintain exercise routine while monitoring Spo2 independently           Oxygen Discharge (Final Oxygen Re-Evaluation):  Oxygen Re-Evaluation - 10/10/20 1542      Program Oxygen Prescription   Program Oxygen Prescription None      Home Oxygen   Home Oxygen Device None    Sleep Oxygen Prescription None    Home Exercise Oxygen Prescription None    Home Resting Oxygen Prescription None    Compliance with Home Oxygen Use No      Goals/Expected Outcomes   Short Term Goals To learn and understand importance of monitoring SPO2 with pulse oximeter and demonstrate accurate use of the pulse  oximeter.;To learn and understand importance of maintaining oxygen saturations>88%    Long  Term Goals Maintenance of O2 saturations>88%;Verbalizes importance of monitoring SPO2 with pulse oximeter and return demonstration    Comments Collie Siad does not have equipment at home to exercise with. She states that she will try to use some equipment at work on her break while monitoring her SpO2. Informed her that staff can giver her guidance of when she can use machines  and also encourage her.    Goals/Expected Outcomes Short: Start monitoring SpO2 while exercising. Long: maintain exercise routine while monitoring Spo2 independently           Initial Exercise Prescription:  Initial Exercise Prescription - 07/29/20 1600      Date of Initial Exercise RX and Referring Provider   Date 07/29/20    Referring Provider Praveen      Treadmill   MPH 2.8    Grade 1    Minutes 15    METs 3.6      Recumbant Bike   Level 1    RPM 60    Minutes 15    METs 3.6      NuStep   Level 3    SPM 80    Minutes 15    METs 3.6      Prescription Details   Frequency (times per week) 3    Duration Progress to 30 minutes of continuous aerobic without signs/symptoms of physical distress      Intensity   THRR 40-80% of Max Heartrate 110-140    Ratings of Perceived Exertion 11-15    Perceived Dyspnea 0-4      Resistance Training   Training Prescription Yes    Weight 2 lb    Reps 10-15           Perform Capillary Blood Glucose checks as needed.  Exercise Prescription Changes:  Exercise Prescription Changes    Row Name 07/29/20 1600 08/12/20 1000 08/27/20 1000 09/09/20 1200 09/24/20 1500     Response to Exercise   Blood Pressure (Admit) 134/78 132/82 130/58 142/70 154/92   Blood Pressure (Exercise) 154/84 162/80 146/64 142/82 154/78   Blood Pressure (Exit) 136/84 130/74 122/60 124/80 120/78   Heart Rate (Admit) 81 bpm 84 bpm 78 bpm 84 bpm 79 bpm   Heart Rate (Exercise) 105 bpm 101 bpm 109 bpm 107  bpm 104 bpm   Heart Rate (Exit) 88 bpm 84 bpm 87 bpm 88 bpm 78 bpm   Oxygen Saturation (Admit) 97 % 98 % 95 % 96 % 96 %   Oxygen Saturation (Exercise) 94 % 95 % 97 % 94 % 97 %   Oxygen Saturation (Exit) 96 % 94 % 98 % 95 % 96 %   Rating of Perceived Exertion (Exercise) '13 13 14 13 14   ' Perceived Dyspnea (Exercise) '2 2 3 2 2   ' Symptoms -- -- none none none   Comments -- first day exercise -- -- --   Duration -- -- Continue with 30 min of aerobic exercise without signs/symptoms of physical distress. Continue with 30 min of aerobic exercise without signs/symptoms of physical distress. Continue with 30 min of aerobic exercise without signs/symptoms of physical distress.   Intensity -- -- THRR unchanged THRR unchanged THRR unchanged     Progression   Progression -- -- Continue to progress workloads to maintain intensity without signs/symptoms of physical distress. Continue to progress workloads to maintain intensity without signs/symptoms of physical distress. Continue to progress workloads to maintain intensity without signs/symptoms of physical distress.   Average METs -- -- 3.5 2.73 3.2     Resistance Training   Training Prescription -- -- Yes Yes Yes   Weight -- -- 2 lb 3 lb 3 lb   Reps -- -- 10-15 10-15 10-15     Treadmill   MPH -- -- 2.8 2.4 2.5   Grade -- -- '1 1 1   ' Minutes -- -- 15 15  15   METs -- -- 3.53 3.17 3.53     Recumbant Bike   Level -- -- 1.4 -- --   Minutes -- -- 15 -- --   METs -- -- 2.7 -- --     NuStep   Level -- -- '3 4 5   ' SPM -- -- 80 80 80   Minutes -- -- '15 15 15   ' METs -- -- 1.6 2.1 2.9   Row Name 10/07/20 1100 10/21/20 1300           Response to Exercise   Blood Pressure (Admit) 142/88 166/84      Blood Pressure (Exercise) 158/86 162/84      Blood Pressure (Exit) 110/70 142/66      Heart Rate (Admit) 84 bpm 87 bpm      Heart Rate (Exercise) 108 bpm 98 bpm      Heart Rate (Exit) 87 bpm 87 bpm      Oxygen Saturation (Admit) 98 % 96 %      Oxygen  Saturation (Exercise) 94 % 92 %      Oxygen Saturation (Exit) 96 % 95 %      Rating of Perceived Exertion (Exercise) 13 13      Perceived Dyspnea (Exercise) 1 1      Duration Continue with 30 min of aerobic exercise without signs/symptoms of physical distress. Continue with 30 min of aerobic exercise without signs/symptoms of physical distress.      Intensity THRR unchanged THRR unchanged             Progression   Progression Continue to progress workloads to maintain intensity without signs/symptoms of physical distress. Continue to progress workloads to maintain intensity without signs/symptoms of physical distress.      Average METs 2.8 2.4             Resistance Training   Training Prescription Yes Yes      Weight 3 lb 3 lb      Reps 10-15 10-15             Treadmill   MPH 2.6 --      Grade 1 --      Minutes 15 --      METs 3.35 --             Recumbant Bike   Level -- 2      Minutes -- 15      METs -- 1.8             NuStep   Level 2 4      SPM 80 80      Minutes 15 15      METs 1.9 1.8             Exercise Comments:   Exercise Goals and Review:  Exercise Goals    Row Name 07/29/20 1613 07/29/20 1629           Exercise Goals   Increase Physical Activity Yes Yes      Intervention Provide advice, education, support and counseling about physical activity/exercise needs.;Develop an individualized exercise prescription for aerobic and resistive training based on initial evaluation findings, risk stratification, comorbidities and participant's personal goals. Provide advice, education, support and counseling about physical activity/exercise needs.;Develop an individualized exercise prescription for aerobic and resistive training based on initial evaluation findings, risk stratification, comorbidities and participant's personal goals.      Expected Outcomes Short Term: Attend rehab on a regular basis to  increase amount of physical activity.;Long Term: Add in home  exercise to make exercise part of routine and to increase amount of physical activity.;Long Term: Exercising regularly at least 3-5 days a week. Short Term: Attend rehab on a regular basis to increase amount of physical activity.;Long Term: Add in home exercise to make exercise part of routine and to increase amount of physical activity.;Long Term: Exercising regularly at least 3-5 days a week.      Increase Strength and Stamina Yes Yes      Intervention Provide advice, education, support and counseling about physical activity/exercise needs.;Develop an individualized exercise prescription for aerobic and resistive training based on initial evaluation findings, risk stratification, comorbidities and participant's personal goals. Provide advice, education, support and counseling about physical activity/exercise needs.;Develop an individualized exercise prescription for aerobic and resistive training based on initial evaluation findings, risk stratification, comorbidities and participant's personal goals.      Expected Outcomes Short Term: Increase workloads from initial exercise prescription for resistance, speed, and METs.;Short Term: Perform resistance training exercises routinely during rehab and add in resistance training at home;Long Term: Improve cardiorespiratory fitness, muscular endurance and strength as measured by increased METs and functional capacity (6MWT) Short Term: Increase workloads from initial exercise prescription for resistance, speed, and METs.;Short Term: Perform resistance training exercises routinely during rehab and add in resistance training at home;Long Term: Improve cardiorespiratory fitness, muscular endurance and strength as measured by increased METs and functional capacity (6MWT)      Able to understand and use rate of perceived exertion (RPE) scale Yes Yes      Intervention Provide education and explanation on how to use RPE scale Provide education and explanation on how to use  RPE scale      Expected Outcomes Short Term: Able to use RPE daily in rehab to express subjective intensity level;Long Term:  Able to use RPE to guide intensity level when exercising independently Short Term: Able to use RPE daily in rehab to express subjective intensity level;Long Term:  Able to use RPE to guide intensity level when exercising independently      Able to understand and use Dyspnea scale Yes Yes      Intervention Provide education and explanation on how to use Dyspnea scale Provide education and explanation on how to use Dyspnea scale      Expected Outcomes Short Term: Able to use Dyspnea scale daily in rehab to express subjective sense of shortness of breath during exertion;Long Term: Able to use Dyspnea scale to guide intensity level when exercising independently Short Term: Able to use Dyspnea scale daily in rehab to express subjective sense of shortness of breath during exertion;Long Term: Able to use Dyspnea scale to guide intensity level when exercising independently      Knowledge and understanding of Target Heart Rate Range (THRR) Yes Yes      Intervention Provide education and explanation of THRR including how the numbers were predicted and where they are located for reference Provide education and explanation of THRR including how the numbers were predicted and where they are located for reference      Expected Outcomes Short Term: Able to state/look up THRR;Short Term: Able to use daily as guideline for intensity in rehab;Long Term: Able to use THRR to govern intensity when exercising independently Short Term: Able to state/look up THRR;Short Term: Able to use daily as guideline for intensity in rehab;Long Term: Able to use THRR to govern intensity when exercising independently      Able  to check pulse independently Yes Yes      Intervention Provide education and demonstration on how to check pulse in carotid and radial arteries.;Review the importance of being able to check your  own pulse for safety during independent exercise Provide education and demonstration on how to check pulse in carotid and radial arteries.;Review the importance of being able to check your own pulse for safety during independent exercise      Expected Outcomes Short Term: Able to explain why pulse checking is important during independent exercise;Long Term: Able to check pulse independently and accurately Short Term: Able to explain why pulse checking is important during independent exercise;Long Term: Able to check pulse independently and accurately      Understanding of Exercise Prescription Yes Yes      Intervention Provide education, explanation, and written materials on patient's individual exercise prescription Provide education, explanation, and written materials on patient's individual exercise prescription      Expected Outcomes Short Term: Able to explain program exercise prescription;Long Term: Able to explain home exercise prescription to exercise independently Short Term: Able to explain program exercise prescription;Long Term: Able to explain home exercise prescription to exercise independently             Exercise Goals Re-Evaluation :  Exercise Goals Re-Evaluation    Row Name 08/07/20 1622 08/27/20 1031 09/09/20 1250 09/24/20 1528 10/07/20 1154     Exercise Goal Re-Evaluation   Exercise Goals Review Increase Physical Activity;Able to understand and use rate of perceived exertion (RPE) scale;Knowledge and understanding of Target Heart Rate Range (THRR);Understanding of Exercise Prescription;Increase Strength and Stamina;Able to understand and use Dyspnea scale;Able to check pulse independently Increase Physical Activity;Able to understand and use rate of perceived exertion (RPE) scale;Knowledge and understanding of Target Heart Rate Range (THRR);Understanding of Exercise Prescription;Increase Strength and Stamina;Able to understand and use Dyspnea scale;Able to check pulse independently  Increase Physical Activity;Increase Strength and Stamina Increase Physical Activity;Increase Strength and Stamina Increase Physical Activity;Increase Strength and Stamina   Comments Reviewed RPE and dyspnea scales, THR and program prescription with pt today.  Pt voiced understanding and was given a copy of goals to take home. Collie Siad is doing well in rehab and adjusting well to the machines. Her oxygen levels are maintained well during exercise. She is still experiencing some SOB and will take breaks as needed. Will use PLB technique when she needs to. Will continue to monitor. Collie Siad has attended consistently this month.  Staff will review home exercise in the next few sessions. Collie Siad is progressing well and has increased speed on TM and level on T4 .  We will continue to monitor progress. Collie Siad is working towards goal speed of 2.8 on TM.  Her shortness of breath has been a 1 during exercise.  Staff discussed doing only concentric work during weights to reduce muscle soreness.   Expected Outcomes Short: Use RPE daily to regulate intensity. Long: Follow program prescription in THR. Short:  Increase speed on TM Long: Continue to progress overall MET level Short: continue to exercise consistently Long:  increase overall stamina Short:  review home exercise with EP Long: add exercise on days not at [place]:  try changing up strength work to see if it reduces soreness Long:  improve overall stamina   Row Name 10/21/20 1305             Exercise Goal Re-Evaluation   Exercise Goals Review Increase Physical Activity;Increase Strength and Stamina       Comments  Collie Siad has attended twice per week consistently this month.  She is up to 2.6 mph on TM.  She still has muscle soreness after exercise beyond what is normal.  Staff will monitor progress.       Expected Outcomes Short:try to get 3 exercise sessions a week Long: improve overall stamina              Discharge Exercise Prescription (Final Exercise Prescription  Changes):  Exercise Prescription Changes - 10/21/20 1300      Response to Exercise   Blood Pressure (Admit) 166/84    Blood Pressure (Exercise) 162/84    Blood Pressure (Exit) 142/66    Heart Rate (Admit) 87 bpm    Heart Rate (Exercise) 98 bpm    Heart Rate (Exit) 87 bpm    Oxygen Saturation (Admit) 96 %    Oxygen Saturation (Exercise) 92 %    Oxygen Saturation (Exit) 95 %    Rating of Perceived Exertion (Exercise) 13    Perceived Dyspnea (Exercise) 1    Duration Continue with 30 min of aerobic exercise without signs/symptoms of physical distress.    Intensity THRR unchanged      Progression   Progression Continue to progress workloads to maintain intensity without signs/symptoms of physical distress.    Average METs 2.4      Resistance Training   Training Prescription Yes    Weight 3 lb    Reps 10-15      Recumbant Bike   Level 2    Minutes 15    METs 1.8      NuStep   Level 4    SPM 80    Minutes 15    METs 1.8           Nutrition:  Target Goals: Understanding of nutrition guidelines, daily intake of sodium <1538m, cholesterol <2058m calories 30% from fat and 7% or less from saturated fats, daily to have 5 or more servings of fruits and vegetables.  Education: All About Nutrition: -Group instruction provided by verbal, written material, interactive activities, discussions, models, and posters to present general guidelines for heart healthy nutrition including fat, fiber, MyPlate, the role of sodium in heart healthy nutrition, utilization of the nutrition label, and utilization of this knowledge for meal planning. Follow up email sent as well. Written material given at graduation. Flow[facility]  Date 09/04/20  [facility] 1- Verbalizes Understanding      Biometrics:  Pre Biometrics - 07/29/20 1620      Pre Biometrics   Height '5\' 5"'  (1.651 m)    Weight 180 lb 3.2  oz (81.7 kg)    BMI (Calculated) 29.99    Single Leg Stand 11.63 seconds            Nutrition Therapy Plan and Nutrition Goals:  Nutrition Therapy & Goals - 08/05/20 1313      Nutrition Therapy   Diet Heart healthy, low Na, Pulmonary MNT    Drug/Food Interactions Statins/Certain Fruits    Protein (specify units) 75g    Fiber 25 grams    Whole Grain Foods 3 servings    Saturated Fats 12 max. grams    Fruits and Vegetables 5 servings/day    Sodium 1.5 grams      Personal Nutrition Goals   Nutrition Goal LT: weight increasing would like to maintain, improve breathing 7/10 now - when bad - 1-2 x/week sometimes after  meals, rebuild microbiome    Comments Vitamin D prescription 50000 IUs. LDL has decreased and is now below or at 70. Low histamine diet. If B: oatmeal with raisins and brown sugar (at home blueberries) L: not big on lunch - at cafeteria (fish or meat that is more plain and two vegetables that are more plain) - will retain fluid (salt). Will try to not get restaurant food. Since COVID - will buy chicken, 93% beef - make hamburgers (freezes) (2-3x/week), pork roast, fressh fruits and vegetables usually comes bad from instant cart (carrots, potatoes, zucchini).  [place] chicken, salad, cheese, orange (canned). Tries to eat low sodium. snacks: choccolate and bag of chips, not eating lunch much. D: by 6pm most nights. chicken - chicken pie (makes own sauce with cheese, mushrooms, onions, carrots, bisquikc mix for crust), chicken and brown rice (chicken broth and cream cheese), hamburgers, meatloaf. likes 20 minute cooked meals. [place] ripple chips (low Na). water during the day. Appetite is low, taste and smell is altered. Will see doctor next monday for taste. Not a specific food that tastes different- it changes. Discussed adding two hearty snacks in the middle of the day; protein balls, tuna, bean salad, cucumbers/marys crackers/gluten free pretzles and hummus or other dip (she doesn't  like garlic). Discussed greek yogurt after she sees doctor to see if she likes it. Collie Siad is gluten free, and allergic to tree nuts, eggs, peas, celery, milk, most raw vegetables - some cooked vegetables. discussed seeds, protein balls, proats.      Intervention Plan   Intervention Prescribe, educate and counsel regarding individualized specific dietary modifications aiming towards targeted core components such as weight, hypertension, lipid management, diabetes, heart failure and other comorbidities.;Nutrition handout(s) given to patient.    Expected Outcomes Short Term Goal: Understand basic principles of dietary content, such as calories, fat, sodium, cholesterol and nutrients.;Short Term Goal: A plan has been developed with personal nutrition goals set during dietitian appointment.;Long Term Goal: Adherence to prescribed nutrition plan.           Nutrition Assessments:  Nutrition Assessments - 07/29/20 1631      MEDFICTS Scores   Pre Score 17          MEDIFICTS Score Key:  ?70 Need to make dietary changes   40-70 Heart Healthy Diet  ? 40 Therapeutic Level Cholesterol Diet   Picture Your Plate Scores:  <38 Unhealthy dietary pattern with much room for improvement.  41-50 Dietary pattern unlikely to meet recommendations for good health and room for improvement.  51-60 More healthful dietary pattern, with some room for improvement.   >60 Healthy dietary pattern, although there may be some specific behaviors that could be improved.   Nutrition Goals Re-Evaluation:  Nutrition Goals Re-Evaluation    [place] Name 09/09/20 1620 10/10/20 1549           Goals   Current Weight -- 181 lb (82.1 kg)      Nutrition Goal LT: weight increasing would like to maintain, improve breathing 7/10 now - when bad - 1-2 x/week sometimes after meals, rebuild microbiome Lose Weight.      Comment She has met with dietitian is aware of changes to to make.  She is trying to balance her fruits and  vegetables with what she is able to eat and what are low in histimaine responses.  She wants to get back to the store again but trying to find the best time to go.  She really wants to get  back [employer] which is local and can help. Collie Siad has been watching her sodium intake. She is adding fruits to her salads at home. Her steroids make it hard for her to lose weight. She is so busy at work that sometimes she does not eat enough. Collie Siad states that her diet is sporadic and she knows she needs to eat more.      Expected Outcome Short: Continue to add in fruits and vegetables  Long: Continue to make changes to help. Short: add a snack or a small meal for breakfast. Long: maintain a routine diet.             Nutrition Goals Discharge (Final Nutrition Goals Re-Evaluation):  Nutrition Goals Re-Evaluation - 10/10/20 1549      Goals   Current Weight 181 lb (82.1 kg)    Nutrition Goal Lose Weight.    Comment Collie Siad has been watching her sodium intake. She is adding fruits to her salads at home. Her steroids make it hard for her to lose weight. She is so busy at work that sometimes she does not eat enough. Collie Siad states that her diet is sporadic and she knows she needs to eat more.    Expected Outcome Short: add a snack or a small meal for breakfast. Long: maintain a routine diet.           Psychosocial: Target Goals: Acknowledge presence or absence of significant depression and/or stress, maximize coping skills, provide positive support system. Participant is able to verbalize types and ability to use techniques and skills needed for reducing stress and depression.   Education: Stress, Anxiety, and Depression - Group verbal and visual presentation to define topics covered.  Reviews how body is impacted by stress, anxiety, and depression.  Also discusses healthy ways to reduce stress and to treat/manage anxiety and depression.  Written material given at graduation.   Education: Sleep Hygiene -Provides  group verbal and written instruction about how sleep can affect your health.  Define sleep hygiene, discuss sleep cycles and impact of sleep habits. Review good sleep hygiene tips.    Initial Review & Psychosocial Screening:  Initial Psych Review & Screening - 07/29/20 1141      Initial Review   Current issues with Current Stress Concerns    Source of Stress Concerns Unable to perform yard/household activities    Comments Since COVID has been ongoing she deals with the daily stress of that. She has had Covid and has been dealing with some side effects. She has been more short of breath recently and needs some conditioning to help with her shortness of breath. She feels optimisitc about her health and wants to get better with exercise.      Family Dynamics   Good Support System? Yes    Comments She can look to her husband, dog, three daughters and co-workers for support.      Barriers   Psychosocial barriers to participate in program The patient should benefit from training in stress management and relaxation.      Screening Interventions   Interventions To provide support and resources with identified psychosocial needs;Provide feedback about the scores to participant;Encouraged to exercise    Expected Outcomes Short Term goal: Utilizing psychosocial counselor, staff and physician to assist with identification of specific Stressors or current issues interfering with healing process. Setting desired goal for each stressor or current issue identified.;Long Term Goal: Stressors or current issues are controlled or eliminated.;Short Term goal: Identification and review with participant  of any Quality of Life or Depression concerns found by scoring the questionnaire.;Long Term goal: The participant improves quality of Life and PHQ9 Scores as seen by post scores and/or verbalization of changes           Quality of Life Scores:  Scores of 19 and below usually indicate a poorer quality of life in  these areas.  A difference of  2-3 points is a clinically meaningful difference.  A difference of 2-3 points in the total score of the Quality of Life Index has been associated with significant improvement in overall quality of life, self-image, physical symptoms, and general health in studies assessing change in quality of life.  PHQ-9: Recent Review Flowsheet Data    Depression screen [facility] 2/9 07/29/2020 02/08/2020 03/28/2015   Decreased Interest 0 0 0   Down, Depressed, Hopeless 0 0 0   PHQ - 2 Score 0 0 0   Altered sleeping 0 - -   Tired, decreased energy 3  - -   Change in appetite 0 - -   Feeling bad or failure about yourself  0 - -   Trouble concentrating 0 - -   Moving slowly or fidgety/restless 0 - -   Suicidal thoughts 0 - -   PHQ-9 Score 3 - -   Difficult doing work/chores Not difficult at all - -     Interpretation of Total Score  Total Score Depression Severity:  1-4 = Minimal depression, 5-9 = Mild depression, 10-14 = Moderate depression, 15-19 = Moderately severe depression, 20-27 = Severe depression   Psychosocial Evaluation and Intervention:  Psychosocial Evaluation - 07/29/20 1147      Psychosocial Evaluation & Interventions   Interventions Encouraged to exercise with the program and follow exercise prescription;Stress management education;Relaxation education    Comments Since COVID has been ongoing she deals with the daily stress of that. She has had Covid and has been dealing with some side effects. She has been more short of breath recently and needs some conditioning to help with her shortness of breath. She feels optimisitc about her health and wants to get better with exercise.    Expected Outcomes Short: Attend [facility] to improve move. Long: maintain a positive outllok on health and graduate [facility].    Continue Psychosocial Services  Follow up required by staff           Psychosocial Re-Evaluation:  Psychosocial Re-Evaluation    [place] Name 08/22/20 1437  09/09/20 1614 10/10/20 1557         Psychosocial Re-Evaluation   Current issues with Current Stress Concerns Current Stress Concerns Current Stress Concerns     Comments She gets stressed with not being able to breath at times and gets short of breath. Collie Siad has a positive outlook on her health and wants to get healthier. Collie Siad is doing well mentally.  We made it through [place] which is a weight off her shoulders.  She still gets frustrated with not being able to do what she wants. Since COVID, she is sleeping better than ever.   She still gets up some at night, but is able to get back to sleep easily.  She continues to work on her breathing.  Her biggest stressor is still not being able to see her grandkids and hug them.  It has been over 3 years since she saw them. Collie Siad is stressed about Christmas since she has not seen her daughter in three years. She does not want to talk  about it becuase she may end up crying. She has taken off in June to try to make it work.     Expected Outcomes Short: continue to exercise to improve shortness of breath and mood. Long: maintain exercise post [facility] to keep stress at a minimum. Short: Continue to work on breathing and stamina  Long; Continue to talk to girls over the phone. Short: continue to exercise to keep stress at a minimum. Long: maintain exercise independently to keep stress minimal.     Interventions Encouraged to attend Pulmonary Rehabilitation for the exercise Encouraged to attend Pulmonary Rehabilitation for the exercise Encouraged to attend Pulmonary Rehabilitation for the exercise     Continue Psychosocial Services  Follow up required by staff Follow up required by staff Follow up required by staff           Initial Review   Source of Stress Concerns -- Unable to perform yard/household activities --            Psychosocial Discharge (Final Psychosocial Re-Evaluation):  Psychosocial Re-Evaluation - 10/10/20 1557      Psychosocial  Re-Evaluation   Current issues with Current Stress Concerns    Comments Collie Siad is stressed about Christmas since she has not seen her daughter in three years. She does not want to talk about it becuase she may end up crying. She has taken off in June to try to make it work.    Expected Outcomes Short: continue to exercise to keep stress at a minimum. Long: maintain exercise independently to keep stress minimal.    Interventions Encouraged to attend Pulmonary Rehabilitation for the exercise    Continue Psychosocial Services  Follow up required by staff           Education: Education Goals: Education classes will be provided on a weekly basis, covering required topics. Participant will state understanding/return demonstration of topics presented.  Learning Barriers/Preferences:  Learning Barriers/Preferences - 07/29/20 1139      Learning Barriers/Preferences   Learning Barriers Sight    Learning Preferences None           General Pulmonary Education Topics:  Infection Prevention: - Provides verbal and written material to individual with discussion of infection control including proper hand washing and proper equipment cleaning during exercise session. Flowsheet Row Pulmonary Rehab from 09/04/2020 in [facility] Card[facility] and Pulmonary Rehab  Date 07/29/20  Educator [facility]  Instruction Review Code 1- Verbalizes Understanding      Falls Prevention: - Provides verbal and written material to individual with discussion of falls prevention and safety. Flowsheet Row Pulmonary Rehab from 09/04/2020 in [facility] Cardiac and Pulmonary Rehab  Date 07/29/20  Educator [facility]  Instruction Review Code 1- Verbalizes Understanding      Chronic Lung Disease Review: - Group verbal instruction with posters, models, PowerPoint presentations and videos,  to review new updates, new respiratory medications, new advancements in procedures and treatments. Providing information on websites and "800" numbers for continued  self-education. Includes information about supplement oxygen, available portable oxygen systems, continuous and intermittent flow rates, oxygen safety, concentrators, and Medicare reimbursement for oxygen. Explanation of Pulmonary Drugs, including class, frequency, complications, importance of spacers, rinsing mouth after steroid MDI's, and proper cleaning methods for nebulizers. Review of basic lung anatomy and physiology related to function, structure, and complications of lung disease. Review of risk factors. Discussion about methods for diagnosing sleep apnea and types of masks and machines for OSA. Includes a review of the use of types of environmental controls: home  humidity, furnaces, filters, dust mite/pet prevention, HEPA vacuums. Discussion about weather changes, air quality and the benefits of nasal washing. Instruction on Warning signs, infection symptoms, calling MD promptly, preventive modes, and value of vaccinations. Review of effective airway clearance, coughing and/or vibration techniques. Emphasizing that all should Create an Action Plan. Written material given at graduation.   AED/CPR: - Group verbal and written instruction with the use of models to demonstrate the basic use of the AED with the basic ABC's of resuscitation.    Anatomy and Cardiac Procedures: - Group verbal and visual presentation and models provide information about basic cardiac anatomy and function. Reviews the testing methods done to diagnose heart disease and the outcomes of the test results. Describes the treatment choices: Medical Management, Angioplasty, or Coronary Bypass Surgery for treating various heart conditions including Myocardial Infarction, Angina, Valve Disease, and Cardiac Arrhythmias.  Written material given at graduation.   Medication Safety: - Group verbal and visual instruction to review commonly prescribed medications for heart and lung disease. Reviews the medication, class of the drug, and  side effects. Includes the steps to properly store meds and maintain the prescription regimen.  Written material given at graduation.   Other: -Provides group and verbal instruction on various topics (see comments)   Knowledge Questionnaire Score:  Knowledge Questionnaire Score - 07/29/20 1631      Knowledge Questionnaire Score   Pre Score 18/18            Core Components/Risk Factors/Patient Goals at Admission:  Personal Goals and Risk Factors at Admission - 07/29/20 1634      Core Components/Risk Factors/Patient Goals on Admission    Weight Management Yes;Weight Loss    Intervention Weight Management: Develop a combined nutrition and exercise program designed to reach desired caloric intake, while maintaining appropriate intake of nutrient and fiber, sodium and fats, and appropriate energy expenditure required for the weight goal.;Weight Management: Provide education and appropriate resources to help participant work on and attain dietary goals.;Weight Management/Obesity: Establish reasonable short term and long term weight goals.    Admit Weight 180 lb 3.2 oz (81.7 kg)    Goal Weight: Short Term 175 lb (79.4 kg)    Goal Weight: Long Term 170 lb (77.1 kg)    Expected Outcomes Short Term: Continue to assess and modify interventions until short term weight is achieved;Long Term: Adherence to nutrition and physical activity/exercise program aimed toward attainment of established weight goal;Weight Maintenance: Understanding of the daily nutrition guidelines, which includes 25-35% calories from fat, 7% or less cal from saturated fats, less than 270m cholesterol, less than 1.5gm of sodium, & 5 or more servings of fruits and vegetables daily;Weight Loss: Understanding of general recommendations for a balanced deficit meal plan, which promotes 1-2 lb weight loss per week and includes a negative energy balance of [phone] kcal/d;Understanding recommendations for meals to include 15-35% energy as  protein, 25-35% energy from fat, 35-60% energy from carbohydrates, less than 2045mof dietary cholesterol, 20-35 gm of total fiber daily;Understanding of distribution of calorie intake throughout the day with the consumption of 4-5 meals/snacks    Improve shortness of breath with ADL's Yes    Intervention Provide education, individualized exercise plan and daily activity instruction to help decrease symptoms of SOB with activities of daily living.    Expected Outcomes Short Term: Improve cardiorespiratory fitness to achieve a reduction of symptoms when performing ADLs;Long Term: Be able to perform more ADLs without symptoms or delay the onset of symptoms  Hypertension Yes    Intervention Provide education on lifestyle modifcations including regular physical activity/exercise, weight management, moderate sodium restriction and increased consumption of fresh fruit, vegetables, and low fat dairy, alcohol moderation, and smoking cessation.;Monitor prescription use compliance.    Expected Outcomes Short Term: Continued assessment and intervention until BP is < 140/74m HG in hypertensive participants. < 130/867mHG in hypertensive participants with diabetes, heart failure or chronic kidney disease.;Long Term: Maintenance of blood pressure at goal levels.    Lipids Yes    Intervention Provide education and support for participant on nutrition & aerobic/resistive exercise along with prescribed medications to achieve LDL <7016mHDL >2m90m  Expected Outcomes Short Term: Participant states understanding of desired cholesterol values and is compliant with medications prescribed. Participant is following exercise prescription and nutrition guidelines.;Long Term: Cholesterol controlled with medications as prescribed, with individualized exercise RX and with personalized nutrition plan. Value goals: LDL < 70mg22mL > 40 mg.           Education:Diabetes - Individual verbal and written instruction to review  signs/symptoms of diabetes, desired ranges of glucose level fasting, after meals and with exercise. Acknowledge that pre and post exercise glucose checks will be done for 3 sessions at entry of program.   Know Your Numbers and Heart Failure: - Group verbal and visual instruction to discuss disease risk factors for cardiac and pulmonary disease and treatment options.  Reviews associated critical values for Overweight/Obesity, Hypertension, Cholesterol, and Diabetes.  Discusses basics of heart failure: signs/symptoms and treatments.  Introduces Heart Failure Zone chart for action plan for heart failure.  Written material given at graduation.   Core Components/Risk Factors/Patient Goals Review:   Goals and Risk Factor Review    Row Name 08/22/20 1429 09/09/20 1617 10/10/20 1547         Core Components/Risk Factors/Patient Goals Review   Personal Goals Review Weight Management/Obesity;Improve shortness of breath with ADL's Weight Management/Obesity;Improve shortness of breath with ADL's;Hypertension Weight Management/Obesity;Improve shortness of breath with ADL's     Review If sue gets up at night and walks up the three steps to go to the bathroom she will get short of breath. She is going to try to sit up first take a few deep breaths then get up. Work on PLB while doing strenous activities. Sue iCollie Siadaintaining her weight.  She is usually up and down 3-4 lbs.  The predinisone is not helping and she is salt sensitive.  She has tried to start taking her Lasix a little more frequently to help.  Her breathing is starting to improve, but it is still there and come on strong.  Her blood pressures are doing better but are still high in doctor's office 140/92, but in class they are doing well. Sue iCollie Siadorking on losing weight and has been gaining. She is taking steriods which makes it hard for her to lose weight.Spoke to patient about their shortness of breath and what they can do to improve. Patient has been  informed of breathing techniques when starting the program. Patient is informed to tell staff if they have had any med changes and that certain meds they are taking or not taking can be causing shortness of breath.     Expected Outcomes Short: Work on PLB when doing ADL's. Long: maintain breathing techniques at home independently. Short: Continue to work [facility]; Continue to improve breathing. Short: Attend [facility] regularly to improve shortness of breath with ADL's. Long: maintain independence with  ADL's            Core Components/Risk Factors/Patient Goals at Discharge (Final Review):   Goals and Risk Factor Review - 10/10/20 1547      Core Components/Risk Factors/Patient Goals Review   Personal Goals Review Weight Management/Obesity;Improve shortness of breath with ADL's    Review Collie Siad is working on losing weight and has been gaining. She is taking steriods which makes it hard for her to lose weight.Spoke to patient about their shortness of breath and what they can do to improve. Patient has been informed of breathing techniques when starting the program. Patient is informed to tell staff if they have had any med changes and that certain meds they are taking or not taking can be causing shortness of breath.    Expected Outcomes Short: Attend [facility] regularly to improve shortness of breath with ADL's. Long: maintain independence with ADL's           ITP Comments:  ITP Comments    Row Name 07/29/20 1159 07/29/20 1634 07/31/20 0733 08/05/20 1525 08/07/20 1620   ITP Comments In person Visit completed. Patient informed on EP and RD appointment and 6 Minute walk test. Patient also informed of patient health questionnaires on My Chart. Patient Verbalizes understanding. Visit diagnosis can be found in [facility] 07/19/2020. Completed 6MWT and gym orientation. Initial ITP created and sent for review to Dr. Emily Filbert, Medical Director. 30 day review completed. ITP sent to Dr. Emily Filbert, Medical Director of Cardiac and Pulmonary Rehab. Continue with ITP unless changes are made by physician.  Patient is new to program. Completed Initial RD Evaluation First full day of exercise!  Patient was oriented to gym and equipment including functions, settings, policies, and procedures.  Patient's individual exercise prescription and treatment plan were reviewed.  All starting workloads were established based on the results of the 6 minute walk test done at initial orientation visit.  The plan for exercise progression was also introduced and progression will be customized based on patient's performance and goals.   [place] Name 08/28/20 0749 09/25/20 1002 10/23/20 0825       ITP Comments 30 day review completed. ITP sent to Dr. Emily Filbert, Medical Director of Cardiac and Pulmonary Rehab. Continue with ITP unless changes are made by physician. 30 Day review completed. Medical Director ITP review done, changes made as directed, and signed approval by Medical Director. 30 Day review completed. Medical Director ITP review done, changes made as directed, and signed approval by Medical Director.            Comments:

## 2020-10-24 ENCOUNTER — Encounter: Payer: 59 | Admitting: *Deleted

## 2020-10-24 ENCOUNTER — Other Ambulatory Visit: Payer: Self-pay

## 2020-10-24 DIAGNOSIS — R0600 Dyspnea, unspecified: Secondary | ICD-10-CM

## 2020-10-24 NOTE — Progress Notes (Signed)
Daily Session Note  Patient Details  Name: Rachel Fry MRN: [id number] Date of Birth: 16-Jul-1956 Referring Provider:   Flowsheet Row Pulmonary Rehab from 07/29/2020 in [facility] Cardiac and Pulmonary Rehab  Referring Provider Praveen      Encounter Date: 10/24/2020  Check In:  Session Check In - 10/24/20 [place]      Check-In   Supervising physician immediately available to respond to emergencies See telemetry face sheet for immediately available ER MD    Location [facility]-Cardiac & Pulmonary Rehab    Staff Present Renita Papa, RN Vickki Hearing, BA, ACSM CEP, Exercise Physiologist;Laureen Owens Shark, BS, RRT, CPFT    Virtual Visit No    Medication changes reported     No    Fall or balance concerns reported    No    Warm-up and Cool-down Performed on first and last piece of equipment    Resistance Training Performed Yes    VAD Patient? No    PAD/SET Patient? No      Pain Assessment   Currently in Pain? No/denies              Social History   Tobacco Use  Smoking Status Never Smoker  Smokeless Tobacco Never Used    Goals Met:  Independence with exercise equipment Exercise tolerated well No report of cardiac concerns or symptoms Strength training completed today  Goals Unmet:  Not Applicable  Comments: Pt able to follow exercise prescription today without complaint.  Will continue to monitor for progression.    Dr. Emily Filbert is Medical Director for [place] and [facility].

## 2020-10-28 ENCOUNTER — Other Ambulatory Visit: Payer: Self-pay

## 2020-10-28 ENCOUNTER — Encounter: Payer: 59 | Attending: Pulmonary Disease

## 2020-10-28 DIAGNOSIS — R0600 Dyspnea, unspecified: Secondary | ICD-10-CM | POA: Insufficient documentation

## 2020-10-28 NOTE — Progress Notes (Signed)
Daily Session Note  Patient Details  Name: Rachel Fry MRN: [id number] Date of Birth: [birth year] Referring Provider:   Flowsheet Row Pulmonary Rehab from 07/29/2020 in [facility] Cardiac and Pulmonary Rehab  Referring Provider [place]      Encounter Date: 10/28/2020  Check In:  Session Check In - 10/28/20 1558      Check-In   Supervising physician immediately available to respond to emergencies See telemetry face sheet for immediately available ER MD    Location [facility]-Cardiac & Pulmonary Rehab    Staff Present Birdie Sons, MPA, RN;Melissa Caiola RDN, Tawanna Solo, MS Exercise Physiologist    Virtual Visit No    Medication changes reported     No    Fall or balance concerns reported    No    Warm-up and Cool-down Performed on first and last piece of equipment    Resistance Training Performed Yes    VAD Patient? No    PAD/SET Patient? No      Pain Assessment   Currently in Pain? No/denies              Social History   Tobacco Use  Smoking Status Never Smoker  Smokeless Tobacco Never Used    Goals Met:  Independence with exercise equipment Exercise tolerated well No report of cardiac concerns or symptoms Strength training completed today  Goals Unmet:  Not Applicable  Comments: Pt able to follow exercise prescription today without complaint.  Will continue to monitor for progression.    Dr. Emily Filbert is Medical Director for [place] and [facility].

## 2020-10-30 ENCOUNTER — Other Ambulatory Visit: Payer: Self-pay

## 2020-10-30 DIAGNOSIS — R0600 Dyspnea, unspecified: Secondary | ICD-10-CM

## 2020-10-30 NOTE — Progress Notes (Signed)
Daily Session Note  Patient Details  Name: Rachel Fry MRN: [id number] Date of Birth: July 24, 1956 Referring Provider:   Flowsheet Row Pulmonary Rehab from 07/29/2020 in [facility] Cardiac and Pulmonary Rehab  Referring Provider Praveen      Encounter Date: 10/30/2020  Check In:  Session Check In - 10/30/20 1557      Check-In   Supervising physician immediately available to respond to emergencies See telemetry face sheet for immediately available ER MD    Location [facility]-Cardiac & Pulmonary Rehab    Staff Present Birdie Sons, MPA, Nino Glow, MS Exercise Physiologist;Amanda Oletta Darter, [place], ACSM CEP, Exercise Physiologist    Virtual Visit No    Medication changes reported     No    Fall or balance concerns reported    No    Warm-up and Cool-down Performed on first and last piece of equipment    Resistance Training Performed Yes    VAD Patient? No    PAD/SET Patient? No      Pain Assessment   Currently in Pain? No/denies              Social History   Tobacco Use  Smoking Status Never Smoker  Smokeless Tobacco Never Used    Goals Met:  Independence with exercise equipment Exercise tolerated well No report of cardiac concerns or symptoms Strength training completed today  Goals Unmet:  Not Applicable  Comments: Pt able to follow exercise prescription today without complaint.  Will continue to monitor for progression.    Dr. Emily Filbert is Medical Director for [place] and [facility].

## 2020-10-30 NOTE — Telephone Encounter (Signed)
Have faxed over the two articles detailing 300mg  biweekly to patient insurance and awaiting decision on approval

## 2020-10-31 MED FILL — ZAFIRLUKAST 10 MG TAB: 10 | 90 days supply | Qty: 180 | Fill #1

## 2020-10-31 MED FILL — XOLAIR 150 MG SOLR: 150 | 28 days supply | Qty: 2 | Fill #4

## 2020-11-01 ENCOUNTER — Other Ambulatory Visit (HOSPITAL_COMMUNITY): Payer: Self-pay | Admitting: Neurological Surgery

## 2020-11-01 DIAGNOSIS — Z6830 Body mass index (BMI) 30.0-30.9, adult: Secondary | ICD-10-CM | POA: Diagnosis not present

## 2020-11-01 DIAGNOSIS — I10 Essential (primary) hypertension: Secondary | ICD-10-CM | POA: Diagnosis not present

## 2020-11-01 DIAGNOSIS — S22000A Wedge compression fracture of unspecified thoracic vertebra, initial encounter for closed fracture: Secondary | ICD-10-CM | POA: Diagnosis not present

## 2020-11-01 MED FILL — carBAMazepine 200 MG TABS: 200 | 30 days supply | Qty: 60 | Fill #0

## 2020-11-05 ENCOUNTER — Other Ambulatory Visit: Payer: Self-pay

## 2020-11-05 ENCOUNTER — Ambulatory Visit (INDEPENDENT_AMBULATORY_CARE_PROVIDER_SITE_OTHER): Payer: 59

## 2020-11-05 DIAGNOSIS — L501 Idiopathic urticaria: Secondary | ICD-10-CM

## 2020-11-06 DIAGNOSIS — R0600 Dyspnea, unspecified: Secondary | ICD-10-CM | POA: Diagnosis not present

## 2020-11-06 NOTE — Progress Notes (Signed)
Daily Session Note  Patient Details  Name: ADYLINE HUBERTY MRN: [id number] Date of Birth: 03-26-1956 Referring Provider:   Flowsheet Row Pulmonary Rehab from 07/29/2020 in [facility] Cardiac and Pulmonary Rehab  Referring Provider Praveen      Encounter Date: 11/06/2020  Check In:  Session Check In - 11/06/20 1605      Check-In   Supervising physician immediately available to respond to emergencies See telemetry face sheet for immediately available ER MD    Location [facility]-Cardiac & Pulmonary Rehab    Staff Present Birdie Sons, MPA, Elveria Rising, BA, ACSM CEP, Exercise Physiologist;Kara Eliezer Bottom, MS Exercise Physiologist    Virtual Visit No    Medication changes reported     No    Fall or balance concerns reported    No    Warm-up and Cool-down Performed on first and last piece of equipment    Resistance Training Performed Yes    VAD Patient? No    PAD/SET Patient? No      Pain Assessment   Currently in Pain? No/denies              Social History   Tobacco Use  Smoking Status Never Smoker  Smokeless Tobacco Never Used    Goals Met:  Independence with exercise equipment Exercise tolerated well No report of cardiac concerns or symptoms Strength training completed today  Goals Unmet:  Not Applicable  Comments: Pt able to follow exercise prescription today without complaint.  Will continue to monitor for progression.    Dr. Emily Filbert is Medical Director for [place] and [facility].

## 2020-11-07 ENCOUNTER — Other Ambulatory Visit: Payer: Self-pay

## 2020-11-07 DIAGNOSIS — R0600 Dyspnea, unspecified: Secondary | ICD-10-CM | POA: Diagnosis not present

## 2020-11-07 NOTE — Progress Notes (Signed)
Daily Session Note  Patient Details  Name: Rachel Fry MRN: [id number] Date of Birth: 12/29/1955 Referring Provider:   Flowsheet Row Pulmonary Rehab from 07/29/2020 in [facility] Cardiac and Pulmonary Rehab  Referring Provider Praveen      Encounter Date: 11/07/2020  Check In:  Session Check In - 11/07/20 1009      Check-In   Supervising physician immediately available to respond to emergencies See telemetry face sheet for immediately available ER MD    Location [facility]-Cardiac & Pulmonary Rehab    Staff Present Birdie Sons, MPA, Elveria Rising, BA, ACSM CEP, Exercise Physiologist;Kara Eliezer Bottom, MS Exercise Physiologist    Virtual Visit No    Medication changes reported     No    Fall or balance concerns reported    No    Warm-up and Cool-down Performed on first and last piece of equipment    Resistance Training Performed Yes    VAD Patient? No    PAD/SET Patient? No      Pain Assessment   Currently in Pain? No/denies              Social History   Tobacco Use  Smoking Status Never Smoker  Smokeless Tobacco Never Used    Goals Met:  Independence with exercise equipment Exercise tolerated well No report of cardiac concerns or symptoms Strength training completed today  Goals Unmet:  Not Applicable  Comments: Pt able to follow exercise prescription today without complaint.  Will continue to monitor for progression.    Dr. Emily Filbert is Medical Director for [place] and [facility].

## 2020-11-11 MED FILL — AIMOVIG 140 MG/ML SOAJ: 140 | 30 days supply | Qty: 1 | Fill #4

## 2020-11-12 ENCOUNTER — Other Ambulatory Visit (HOSPITAL_COMMUNITY): Payer: Self-pay | Admitting: Psychiatry

## 2020-11-12 MED FILL — NURTEC 75 MG TBDP: 75 | 30 days supply | Qty: 8 | Fill #0

## 2020-11-13 ENCOUNTER — Other Ambulatory Visit: Payer: Self-pay

## 2020-11-13 DIAGNOSIS — R0600 Dyspnea, unspecified: Secondary | ICD-10-CM | POA: Diagnosis not present

## 2020-11-13 NOTE — Progress Notes (Signed)
Daily Session Note  Patient Details  Name: Rachel Fry MRN: [id number] Date of Birth: November 09, 1955 Referring Provider:   Flowsheet Row Pulmonary Rehab from 07/29/2020 in [facility] Cardiac and Pulmonary Rehab  Referring Provider Praveen      Encounter Date: 11/13/2020  Check In:  Session Check In - 11/13/20 [place]      Check-In   Supervising physician immediately available to respond to emergencies See telemetry face sheet for immediately available ER MD    Location [facility]-Cardiac & Pulmonary Rehab    Staff Present Birdie Sons, MPA, RN;Joseph Lou Miner, [place] Exercise Physiologist    Virtual Visit No    Medication changes reported     No    Fall or balance concerns reported    No    Warm-up and Cool-down Performed on first and last piece of equipment    Resistance Training Performed Yes    VAD Patient? No    PAD/SET Patient? No      Pain Assessment   Currently in Pain? No/denies              Social History   Tobacco Use  Smoking Status Never Smoker  Smokeless Tobacco Never Used    Goals Met:  Independence with exercise equipment Exercise tolerated well No report of cardiac concerns or symptoms Strength training completed today  Goals Unmet:  Not Applicable  Comments: Pt able to follow exercise prescription today without complaint.  Will continue to monitor for progression.    Dr. Emily Filbert is Medical Director for [place] and [facility].

## 2020-11-14 ENCOUNTER — Encounter: Payer: Self-pay | Admitting: *Deleted

## 2020-11-14 DIAGNOSIS — R0600 Dyspnea, unspecified: Secondary | ICD-10-CM

## 2020-11-18 ENCOUNTER — Other Ambulatory Visit (HOSPITAL_COMMUNITY): Payer: Self-pay | Admitting: Internal Medicine

## 2020-11-18 DIAGNOSIS — I10 Essential (primary) hypertension: Secondary | ICD-10-CM | POA: Diagnosis not present

## 2020-11-18 DIAGNOSIS — R609 Edema, unspecified: Secondary | ICD-10-CM | POA: Diagnosis not present

## 2020-11-18 DIAGNOSIS — Z8616 Personal history of COVID-19: Secondary | ICD-10-CM | POA: Diagnosis not present

## 2020-11-18 DIAGNOSIS — D8940 Mast cell activation, unspecified: Secondary | ICD-10-CM | POA: Diagnosis not present

## 2020-11-18 DIAGNOSIS — M810 Age-related osteoporosis without current pathological fracture: Secondary | ICD-10-CM | POA: Diagnosis not present

## 2020-11-18 MED FILL — predniSONE 5 MG TABS: 5 | 30 days supply | Qty: 90 | Fill #0

## 2020-11-18 MED FILL — TORSEMIDE 20 MG TABLET: 20 | 90 days supply | Qty: 180 | Fill #0

## 2020-11-18 MED FILL — VASCEPA 1 GM CAPSULE: 1 | 15 days supply | Qty: 60 | Fill #0

## 2020-11-18 MED FILL — SPIRONOLACTONE 50 MG TABLET: 50 | 90 days supply | Qty: 180 | Fill #0

## 2020-11-20 ENCOUNTER — Encounter: Payer: Self-pay | Admitting: *Deleted

## 2020-11-20 ENCOUNTER — Other Ambulatory Visit: Payer: Self-pay

## 2020-11-20 DIAGNOSIS — R0600 Dyspnea, unspecified: Secondary | ICD-10-CM

## 2020-11-20 NOTE — Progress Notes (Signed)
Pulmonary Individual Treatment Plan  Patient Details  Name: Rachel Fry MRN: [id number] Date of Birth: August 29, 1956 Referring Provider:   Flowsheet Row Pulmonary Rehab from 07/29/2020 in [facility] Cardiac and Pulmonary Rehab  Referring Provider Praveen      Initial Encounter Date:  Flowsheet Row Pulmonary Rehab from 07/29/2020 in [facility] Cardiac and Pulmonary Rehab  Date 07/29/20      Visit Diagnosis: Dyspnea, unspecified type  Patient's Home Medications on Admission:  Current Outpatient Medications:  .  albuterol (VENTOLIN HFA) 108 (90 Base) MCG/ACT inhaler, Inhale 2 puffs into the lungs every 4 (four) hours as needed for wheezing or shortness of breath. , Disp: , Rfl:  .  atorvastatin (LIPITOR) 20 MG tablet, TAKE 1 TABLET (20 MG TOTAL) BY MOUTH DAILY. (Patient taking differently: Take 20 mg by mouth at bedtime. ), Disp: 90 tablet, Rfl: 3 .  b complex vitamins capsule, Take 1 capsule by mouth daily., Disp: , Rfl:  .  Bacillus Coagulans-Inulin (PROBIOTIC) 1-250 BILLION-MG CAPS, Probiotic  Take 1 cap po daily, Disp: , Rfl:  .  botulinum toxin Type A (BOTOX) 100 UNITS SOLR injection, Botox - Historical Medication  once every 12 weeks for migraines Active, Disp: , Rfl:  .  cetirizine (ZYRTEC) 10 MG tablet, Take 10 mg by mouth every evening. , Disp: , Rfl:  .  cloNIDine (CATAPRES) 0.1 MG tablet, Take 1 tablet (0.1 mg total) by mouth 2 (two) times daily., Disp: 60 tablet, Rfl:  .  EPINEPHrine 0.3 mg/0.3 mL IJ SOAJ injection, Inject 0.3 mg into the muscle as needed for anaphylaxis. , Disp: , Rfl: 1 .  Erenumab-aooe (AIMOVIG) 70 MG/ML SOAJ, Inject 70 mg into the skin every 30 (thirty) days. , Disp: , Rfl:  .  ezetimibe (ZETIA) 10 MG tablet, Take 10 mg by mouth every evening. , Disp: , Rfl:  .  famotidine (PEPCID) 20 MG tablet, Take 20 mg by mouth 2 (two) times daily., Disp: , Rfl:  .  fluticasone (FLOVENT HFA) 110 MCG/ACT inhaler, Inhale 2 puffs into the lungs 2 (two) times daily. , Disp: , Rfl:  .   frovatriptan (FROVA) 2.5 MG tablet, Take 2.5 mg by mouth 2 (two) times daily as needed for migraine. Max of 3 doses per 7 days, Disp: , Rfl:  .  furosemide (LASIX) 20 MG tablet, Take 40 mg by mouth daily as needed for edema. , Disp: , Rfl:  .  levothyroxine (SYNTHROID, LEVOTHROID) 50 MCG tablet, Take 50 mcg by mouth daily before breakfast. , Disp: , Rfl: 5 .  Menaquinone-7 (VITAMIN K2 PO), Take by mouth., Disp: , Rfl:  .  omalizumab (XOLAIR) 150 MG injection, Inject 300 mg into the skin every 28 (twenty-eight) days., Disp: 2 each, Rfl: 11 .  Polyethyl Glycol-Propyl Glycol (SYSTANE OP), Place 1-2 drops into both eyes 2 (two) times daily., Disp: , Rfl:  .  predniSONE (DELTASONE) 5 MG tablet, Take 5 mg by mouth daily., Disp: , Rfl:  .  Rimegepant Sulfate (NURTEC) 75 MG TBDP, Take 75 mg by mouth daily as needed (migraines). Max 15 doses per 30 days, Disp: , Rfl:  .  rizatriptan (MAXALT) 10 MG tablet, Take 10 mg by mouth 2 (two) times daily as needed for migraine. Max doses of 3 doses per 7 days, Disp: , Rfl:  .  spironolactone (ALDACTONE) 25 MG tablet, Take 50 mg by mouth daily. , Disp: , Rfl:  .  triamcinolone (NASACORT ALLERGY 24HR) 55 MCG/ACT AERO nasal inhaler, Place 2 sprays  into the nose at bedtime., Disp: , Rfl:  .  Vitamin D, Ergocalciferol, (DRISDOL) 1.25 MG (50000 UT) CAPS capsule, Take 50,000 Units by mouth every Sunday. , Disp: , Rfl: 3 .  zafirlukast (ACCOLATE) 10 MG tablet, Take 10 mg by mouth 2 (two) times daily. , Disp: , Rfl:   Current Facility-Administered Medications:  .  omalizumab Arvid Right) injection 300 mg, 300 mg, Subcutaneous, Q28 days, Valentina Shaggy, MD, 300 mg at 11/05/20 1727  Past Medical History: Past Medical History:  Diagnosis Date  . Allergy   . Asthma   . History of kidney stones   . Hypothyroidism   . Migraines   . Numbness and tingling    left side of body, occasionally right side   . S/P Botox injection     Tobacco Use: Social History   Tobacco  Use  Smoking Status Never Smoker  Smokeless Tobacco Never Used    Labs: Recent Review Flowsheet Data    Labs for ITP Cardiac and Pulmonary Rehab Latest Ref Rng & Units 03/30/2014 07/24/2014 05/03/2015 12/10/2016 09/26/2019   Cholestrol 100 - 199 mg/dL 214(A) 177 - 201(H) -   LDLCALC 0 - 99 mg/dL 140 115(H) - 123(H) -   HDL >39 mg/dL 59 46 - 66 59   Trlycerides 0 - 149 mg/dL 74 81 - 61 116   Hemoglobin A1c 4.8 - 5.6 % 5.9 - 6.0(H) 5.4 -       Pulmonary Assessment Scores:  Pulmonary Assessment Scores    Row Name 07/29/20 1630         ADL UCSD   SOB Score total 45     Rest 1     Walk 3     Stairs 4     Bath 0     Dress 0     Shop 2           CAT Score   CAT Score 16           mMRC Score   mMRC Score 2            UCSD: Self-administered rating of dyspnea associated with activities of daily living (ADLs) 6-point scale (0 = "not at all" to 5 = "maximal or unable to do because of breathlessness")  Scoring Scores range from 0 to 120.  Minimally important difference is 5 units  CAT: CAT can identify the health impairment of COPD patients and is better correlated with disease progression.  CAT has a scoring range of zero to 40. The CAT score is classified into four groups of low (less than 10), medium (10 - 20), high (21-30) and very high (31-40) based on the impact level of disease on health status. A CAT score over 10 suggests significant symptoms.  A worsening CAT score could be explained by an exacerbation, poor medication adherence, poor inhaler technique, or progression of COPD or comorbid conditions.  CAT MCID is 2 points  mMRC: mMRC (Modified Medical Research Council) Dyspnea Scale is used to assess the degree of baseline functional disability in patients of respiratory disease due to dyspnea. No minimal important difference is established. A decrease in score of 1 point or greater is considered a positive change.   Pulmonary Function Assessment:  Pulmonary Function  Assessment - 07/29/20 1138      Breath   Bilateral Breath Sounds Clear    Shortness of Breath Yes;Limiting activity           Exercise Target Goals: Exercise Program  Goal: Individual exercise prescription set using results from initial 6 min walk test and THRR while considering  patient's activity barriers and safety.   Exercise Prescription Goal: Initial exercise prescription builds to 30-45 minutes a day of aerobic activity, 2-3 days per week.  Home exercise guidelines will be given to patient during program as part of exercise prescription that the participant will acknowledge.  Education: Aerobic Exercise: - Group verbal and visual presentation on the components of exercise prescription. Introduces F.I.T.T principle from ACSM for exercise prescriptions.  Reviews F.I.T.T. principles of aerobic exercise including progression. Written material given at graduation.   Education: Resistance Exercise: - Group verbal and visual presentation on the components of exercise prescription. Introduces F.I.T.T principle from ACSM for exercise prescriptions  Reviews F.I.T.T. principles of resistance exercise including progression. Written material given at graduation.    Education: Exercise & Equipment Safety: - Individual verbal instruction and demonstration of equipment use and safety with use of the equipment. Flowsheet Row Pulmonary Rehab from 09/04/2020 in [facility] Cardiac and Pulmonary Rehab  Date 07/29/20  Educator [facility]  Instruction Review Code 1- Verbalizes Understanding      Education: Exercise Physiology & General Exercise Guidelines: - Group verbal and written instruction with models to review the exercise physiology of the cardiovascular system and associated critical values. Provides general exercise guidelines with specific guidelines to those with heart or lung disease.    Education: Flexibility, Balance, Mind/Body Relaxation: - Group verbal and visual presentation with interactive  activity on the components of exercise prescription. Introduces F.I.T.T principle from ACSM for exercise prescriptions. Reviews F.I.T.T. principles of flexibility and balance exercise training including progression. Also discusses the mind body connection.  Reviews various relaxation techniques to help reduce and manage stress (i.e. Deep breathing, progressive muscle relaxation, and visualization). Balance handout provided to take home. Written material given at graduation.   Activity Barriers & Risk Stratification:  Activity Barriers & Cardiac Risk Stratification - 07/29/20 1152      Activity Barriers & Cardiac Risk Stratification   Activity Barriers Shortness of Breath;Balance Concerns;Chest Pain/Angina;Other (comment);Fibromyalgia    Comments Chest pain from Covid.    Cardiac Risk Stratification Low           6 Minute Walk:  6 Minute Walk    Row Name 07/29/20 1622         6 Minute Walk   Phase Initial     Distance 1480 feet     Walk Time 6 minutes     # of Rest Breaks 0     MPH 2.8     METS 3.6     RPE 13     Perceived Dyspnea  2     VO2 Peak 12.6     Symptoms No     Resting HR 81 bpm     Resting BP 134/78     Resting Oxygen Saturation  97 %     Exercise Oxygen Saturation  during 6 min walk 95 %     Max Ex. HR 105 bpm     Max Ex. BP 154/84     2 Minute Post BP 136/84           Interval HR   1 Minute HR 96     2 Minute HR 97     3 Minute HR 102     4 Minute HR 101     5 Minute HR 105     6 Minute HR 104     2  Minute Post HR 88     Interval Heart Rate? Yes           Interval Oxygen   Interval Oxygen? Yes     Baseline Oxygen Saturation % 97 %     1 Minute Oxygen Saturation % 96 %     1 Minute Liters of Oxygen 0 L     2 Minute Oxygen Saturation % 96 %     2 Minute Liters of Oxygen 0 L     3 Minute Oxygen Saturation % 95 %     3 Minute Liters of Oxygen 0 L     4 Minute Oxygen Saturation % 96 %     4 Minute Liters of Oxygen 0 L     5 Minute Oxygen  Saturation % 97 %     5 Minute Liters of Oxygen 0 L     6 Minute Oxygen Saturation % 94 %     6 Minute Liters of Oxygen 0 L     2 Minute Post Oxygen Saturation % 96 %     2 Minute Post Liters of Oxygen 0 L           Oxygen Initial Assessment:  Oxygen Initial Assessment - 07/29/20 1138      Home Oxygen   Home Oxygen Device None    Sleep Oxygen Prescription None    Home Exercise Oxygen Prescription None    Home Resting Oxygen Prescription None      Initial 6 min Walk   Oxygen Used None      Program Oxygen Prescription   Program Oxygen Prescription None      Intervention   Short Term Goals To learn and understand importance of monitoring SPO2 with pulse oximeter and demonstrate accurate use of the pulse oximeter.;To learn and understand importance of maintaining oxygen saturations>88%;To learn and demonstrate proper pursed lip breathing techniques or other breathing techniques.;To learn and demonstrate proper use of respiratory medications    Long  Term Goals Verbalizes importance of monitoring SPO2 with pulse oximeter and return demonstration;Maintenance of O2 saturations>88%;Exhibits proper breathing techniques, such as pursed lip breathing or other method taught during program session;Compliance with respiratory medication;Demonstrates proper use of MDI's           Oxygen Re-Evaluation:  Oxygen Re-Evaluation    Row Name 08/07/20 1620 08/22/20 1424 09/09/20 1623 10/10/20 1542 11/14/20 1342     Program Oxygen Prescription   Program Oxygen Prescription None None None None None     Home Oxygen   Home Oxygen Device None None None None None   Sleep Oxygen Prescription None None None None None   Home Exercise Oxygen Prescription None None None None None   Home Resting Oxygen Prescription None None None None None   Compliance with Home Oxygen Use -- -- -- No No     Goals/Expected Outcomes   Short Term Goals To learn and understand importance of monitoring SPO2 with pulse  oximeter and demonstrate accurate use of the pulse oximeter.;To learn and understand importance of maintaining oxygen saturations>88%;To learn and demonstrate proper pursed lip breathing techniques or other breathing techniques.;To learn and demonstrate proper use of respiratory medications To learn and understand importance of maintaining oxygen saturations>88%;To learn and understand importance of monitoring SPO2 with pulse oximeter and demonstrate accurate use of the pulse oximeter.;To learn and demonstrate proper use of respiratory medications To learn and understand importance of maintaining oxygen saturations>88%;To learn and understand importance of monitoring SPO2 with pulse  oximeter and demonstrate accurate use of the pulse oximeter.;To learn and demonstrate proper use of respiratory medications;To learn and demonstrate proper pursed lip breathing techniques or other breathing techniques. To learn and understand importance of monitoring SPO2 with pulse oximeter and demonstrate accurate use of the pulse oximeter.;To learn and understand importance of maintaining oxygen saturations>88% To learn and understand importance of monitoring SPO2 with pulse oximeter and demonstrate accurate use of the pulse oximeter.;To learn and understand importance of maintaining oxygen saturations>88%   Long  Term Goals Verbalizes importance of monitoring SPO2 with pulse oximeter and return demonstration;Maintenance of O2 saturations>88%;Exhibits proper breathing techniques, such as pursed lip breathing or other method taught during program session;Compliance with respiratory medication;Demonstrates proper use of MDI's Maintenance of O2 saturations>88%;Verbalizes importance of monitoring SPO2 with pulse oximeter and return demonstration;Demonstrates proper use of MDI's Maintenance of O2 saturations>88%;Verbalizes importance of monitoring SPO2 with pulse oximeter and return demonstration;Demonstrates proper use of  MDI's;Exhibits proper breathing techniques, such as pursed lip breathing or other method taught during program session Maintenance of O2 saturations>88%;Verbalizes importance of monitoring SPO2 with pulse oximeter and return demonstration Maintenance of O2 saturations>88%;Verbalizes importance of monitoring SPO2 with pulse oximeter and return demonstration   Comments Reviewed PLB technique with pt.  Talked about how it works and it's importance in maintaining their exercise saturations. Collie Siad states that she has been practicing PLB and has been using it. She uses her pulse oximeter at home and knows how to use it. She knows to keep her oxygen above 88 percent. She takes Flovent, Acolate, Zyrtec and Ventolin HFA as needed. She has no questions about her medications. Collie Siad is doing well with her PLB.  She finds that it is helpful for maintaining breath control and is starting to do it more without thinking about it.  She is doing well with her sats and will start to watch her sats with exercise at home. Collie Siad does not have equipment at home to exercise with. She states that she will try to use some equipment at work on her break while monitoring her SpO2. Informed her that staff can giver her guidance of when she can use machines and also encourage her. Collie Siad is still struggling with her breathing.  It does not feel like it is improving.  She has a pulmonary follow up that is supposed to happen in March.  She has noted that her breathing has even come on at rest.  She sees her primary on Monday and we talked about a note to her pulmonologist about her breathing.   Goals/Expected Outcomes Short: Become more profiecient at using PLB.   Long: Become independent at using PLB Short: continue compliance with her medications and program regimine. Long: maintain exercise in [employer] and use breathing techniques. Short: Continue to work on [employer] with exercise at home Long; Continue to exericse to improve breathing.  Short: Start monitoring SpO2 while exercising. Long: maintain exercise routine while monitoring Spo2 independently --          Oxygen Discharge (Final Oxygen Re-Evaluation):  Oxygen Re-Evaluation - 11/14/20 1342      Program Oxygen Prescription   Program Oxygen Prescription None      Home Oxygen   Home Oxygen Device None    Sleep Oxygen Prescription None    Home Exercise Oxygen Prescription None    Home Resting Oxygen Prescription None    Compliance with Home Oxygen Use No      Goals/Expected Outcomes   Short Term Goals To  learn and understand importance of monitoring SPO2 with pulse oximeter and demonstrate accurate use of the pulse oximeter.;To learn and understand importance of maintaining oxygen saturations>88%    Long  Term Goals Maintenance of O2 saturations>88%;Verbalizes importance of monitoring SPO2 with pulse oximeter and return demonstration    Comments Collie Siad is still struggling with her breathing.  It does not feel like it is improving.  She has a pulmonary follow up that is supposed to happen in March.  She has noted that her breathing has even come on at rest.  She sees her primary on Monday and we talked about a note to her pulmonologist about her breathing.           Initial Exercise Prescription:  Initial Exercise Prescription - 07/29/20 1600      Date of Initial Exercise RX and Referring Provider   Date 07/29/20    Referring Provider Praveen      Treadmill   MPH 2.8    Grade 1    Minutes 15    METs 3.6      Recumbant Bike   Level 1    RPM 60    Minutes 15    METs 3.6      NuStep   Level 3    SPM 80    Minutes 15    METs 3.6      Prescription Details   Frequency (times per week) 3    Duration Progress to 30 minutes of continuous aerobic without signs/symptoms of physical distress      Intensity   THRR 40-80% of Max Heartrate 110-140    Ratings of Perceived Exertion 11-15    Perceived Dyspnea 0-4      Resistance Training   Training  Prescription Yes    Weight 2 lb    Reps 10-15           Perform Capillary Blood Glucose checks as needed.  Exercise Prescription Changes:  Exercise Prescription Changes    Row Name 07/29/20 1600 08/12/20 1000 08/27/20 1000 09/09/20 1200 09/24/20 1500     Response to Exercise   Blood Pressure (Admit) 134/78 132/82 130/58 142/70 154/92   Blood Pressure (Exercise) 154/84 162/80 146/64 142/82 154/78   Blood Pressure (Exit) 136/84 130/74 122/60 124/80 120/78   Heart Rate (Admit) 81 bpm 84 bpm 78 bpm 84 bpm 79 bpm   Heart Rate (Exercise) 105 bpm 101 bpm 109 bpm 107 bpm 104 bpm   Heart Rate (Exit) 88 bpm 84 bpm 87 bpm 88 bpm 78 bpm   Oxygen Saturation (Admit) 97 % 98 % 95 % 96 % 96 %   Oxygen Saturation (Exercise) 94 % 95 % 97 % 94 % 97 %   Oxygen Saturation (Exit) 96 % 94 % 98 % 95 % 96 %   Rating of Perceived Exertion (Exercise) '13 13 14 13 14   ' Perceived Dyspnea (Exercise) '2 2 3 2 2   ' Symptoms -- -- none none none   Comments -- first day exercise -- -- --   Duration -- -- Continue with 30 min of aerobic exercise without signs/symptoms of physical distress. Continue with 30 min of aerobic exercise without signs/symptoms of physical distress. Continue with 30 min of aerobic exercise without signs/symptoms of physical distress.   Intensity -- -- THRR unchanged THRR unchanged THRR unchanged     Progression   Progression -- -- Continue to progress workloads to maintain intensity without signs/symptoms of physical distress. Continue to progress workloads to  maintain intensity without signs/symptoms of physical distress. Continue to progress workloads to maintain intensity without signs/symptoms of physical distress.   Average METs -- -- 3.5 2.73 3.2     Resistance Training   Training Prescription -- -- Yes Yes Yes   Weight -- -- 2 lb 3 lb 3 lb   Reps -- -- 10-15 10-15 10-15     Treadmill   MPH -- -- 2.8 2.4 2.5   Grade -- -- '1 1 1   ' Minutes -- -- '15 15 15   ' METs -- -- 3.53 3.17 3.53      Recumbant Bike   Level -- -- 1.4 -- --   Minutes -- -- 15 -- --   METs -- -- 2.7 -- --     NuStep   Level -- -- '3 4 5   ' SPM -- -- 80 80 80   Minutes -- -- '15 15 15   ' METs -- -- 1.6 2.1 2.9   Row Name 10/07/20 1100 10/21/20 1300 11/04/20 0900         Response to Exercise   Blood Pressure (Admit) 142/88 166/84 144/86     Blood Pressure (Exercise) 158/86 162/84 182/84     Blood Pressure (Exit) 110/70 142/66 122/68     Heart Rate (Admit) 84 bpm 87 bpm 80 bpm     Heart Rate (Exercise) 108 bpm 98 bpm 108 bpm     Heart Rate (Exit) 87 bpm 87 bpm 84 bpm     Oxygen Saturation (Admit) 98 % 96 % 96 %     Oxygen Saturation (Exercise) 94 % 92 % 98 %     Oxygen Saturation (Exit) 96 % 95 % 98 %     Rating of Perceived Exertion (Exercise) '13 13 14     ' Perceived Dyspnea (Exercise) '1 1 3     ' Duration Continue with 30 min of aerobic exercise without signs/symptoms of physical distress. Continue with 30 min of aerobic exercise without signs/symptoms of physical distress. Continue with 30 min of aerobic exercise without signs/symptoms of physical distress.     Intensity THRR unchanged THRR unchanged THRR unchanged           Progression   Progression Continue to progress workloads to maintain intensity without signs/symptoms of physical distress. Continue to progress workloads to maintain intensity without signs/symptoms of physical distress. Continue to progress workloads to maintain intensity without signs/symptoms of physical distress.     Average METs 2.8 2.4 2.8           Resistance Training   Training Prescription Yes Yes Yes     Weight 3 lb 3 lb 3 lb     Reps 10-15 10-15 10-15           Treadmill   MPH 2.6 -- 2.7     Grade 1 -- 1     Minutes 15 -- 15     METs 3.35 -- 3.53           Recumbant Bike   Level -- 2 --     Minutes -- 15 --     METs -- 1.8 --           NuStep   Level 2 4 --     SPM 80 80 --     Minutes 15 15 --     METs 1.9 1.8 --           T5 Nustep    Level -- -- 2  SPM -- -- 80     Minutes -- -- 15     METs -- -- 2            Exercise Comments:   Exercise Goals and Review:  Exercise Goals    Row Name 07/29/20 1613 07/29/20 1629           Exercise Goals   Increase Physical Activity Yes Yes      Intervention Provide advice, education, support and counseling about physical activity/exercise needs.;Develop an individualized exercise prescription for aerobic and resistive training based on initial evaluation findings, risk stratification, comorbidities and participant's personal goals. Provide advice, education, support and counseling about physical activity/exercise needs.;Develop an individualized exercise prescription for aerobic and resistive training based on initial evaluation findings, risk stratification, comorbidities and participant's personal goals.      Expected Outcomes Short Term: Attend rehab on a regular basis to increase amount of physical activity.;Long Term: Add in home exercise to make exercise part of routine and to increase amount of physical activity.;Long Term: Exercising regularly at least 3-5 days a week. Short Term: Attend rehab on a regular basis to increase amount of physical activity.;Long Term: Add in home exercise to make exercise part of routine and to increase amount of physical activity.;Long Term: Exercising regularly at least 3-5 days a week.      Increase Strength and Stamina Yes Yes      Intervention Provide advice, education, support and counseling about physical activity/exercise needs.;Develop an individualized exercise prescription for aerobic and resistive training based on initial evaluation findings, risk stratification, comorbidities and participant's personal goals. Provide advice, education, support and counseling about physical activity/exercise needs.;Develop an individualized exercise prescription for aerobic and resistive training based on initial evaluation findings, risk stratification,  comorbidities and participant's personal goals.      Expected Outcomes Short Term: Increase workloads from initial exercise prescription for resistance, speed, and METs.;Short Term: Perform resistance training exercises routinely during rehab and add in resistance training at home;Long Term: Improve cardiorespiratory fitness, muscular endurance and strength as measured by increased METs and functional capacity (6MWT) Short Term: Increase workloads from initial exercise prescription for resistance, speed, and METs.;Short Term: Perform resistance training exercises routinely during rehab and add in resistance training at home;Long Term: Improve cardiorespiratory fitness, muscular endurance and strength as measured by increased METs and functional capacity (6MWT)      Able to understand and use rate of perceived exertion (RPE) scale Yes Yes      Intervention Provide education and explanation on how to use RPE scale Provide education and explanation on how to use RPE scale      Expected Outcomes Short Term: Able to use RPE daily in rehab to express subjective intensity level;Long Term:  Able to use RPE to guide intensity level when exercising independently Short Term: Able to use RPE daily in rehab to express subjective intensity level;Long Term:  Able to use RPE to guide intensity level when exercising independently      Able to understand and use Dyspnea scale Yes Yes      Intervention Provide education and explanation on how to use Dyspnea scale Provide education and explanation on how to use Dyspnea scale      Expected Outcomes Short Term: Able to use Dyspnea scale daily in rehab to express subjective sense of shortness of breath during exertion;Long Term: Able to use Dyspnea scale to guide intensity level when exercising independently Short Term: Able to use Dyspnea scale daily in rehab to  express subjective sense of shortness of breath during exertion;Long Term: Able to use Dyspnea scale to guide intensity  level when exercising independently      Knowledge and understanding of Target Heart Rate Range (THRR) Yes Yes      Intervention Provide education and explanation of THRR including how the numbers were predicted and where they are located for reference Provide education and explanation of THRR including how the numbers were predicted and where they are located for reference      Expected Outcomes Short Term: Able to state/look up THRR;Short Term: Able to use daily as guideline for intensity in rehab;Long Term: Able to use THRR to govern intensity when exercising independently Short Term: Able to state/look up THRR;Short Term: Able to use daily as guideline for intensity in rehab;Long Term: Able to use THRR to govern intensity when exercising independently      Able to check pulse independently Yes Yes      Intervention Provide education and demonstration on how to check pulse in carotid and radial arteries.;Review the importance of being able to check your own pulse for safety during independent exercise Provide education and demonstration on how to check pulse in carotid and radial arteries.;Review the importance of being able to check your own pulse for safety during independent exercise      Expected Outcomes Short Term: Able to explain why pulse checking is important during independent exercise;Long Term: Able to check pulse independently and accurately Short Term: Able to explain why pulse checking is important during independent exercise;Long Term: Able to check pulse independently and accurately      Understanding of Exercise Prescription Yes Yes      Intervention Provide education, explanation, and written materials on patient's individual exercise prescription Provide education, explanation, and written materials on patient's individual exercise prescription      Expected Outcomes Short Term: Able to explain program exercise prescription;Long Term: Able to explain home exercise prescription to  exercise independently Short Term: Able to explain program exercise prescription;Long Term: Able to explain home exercise prescription to exercise independently             Exercise Goals Re-Evaluation :  Exercise Goals Re-Evaluation    Row Name 08/07/20 1622 08/27/20 1031 09/09/20 1250 09/24/20 1528 10/07/20 1154     Exercise Goal Re-Evaluation   Exercise Goals Review Increase Physical Activity;Able to understand and use rate of perceived exertion (RPE) scale;Knowledge and understanding of Target Heart Rate Range (THRR);Understanding of Exercise Prescription;Increase Strength and Stamina;Able to understand and use Dyspnea scale;Able to check pulse independently Increase Physical Activity;Able to understand and use rate of perceived exertion (RPE) scale;Knowledge and understanding of Target Heart Rate Range (THRR);Understanding of Exercise Prescription;Increase Strength and Stamina;Able to understand and use Dyspnea scale;Able to check pulse independently Increase Physical Activity;Increase Strength and Stamina Increase Physical Activity;Increase Strength and Stamina Increase Physical Activity;Increase Strength and Stamina   Comments Reviewed RPE and dyspnea scales, THR and program prescription with pt today.  Pt voiced understanding and was given a copy of goals to take home. Collie Siad is doing well in rehab and adjusting well to the machines. Her oxygen levels are maintained well during exercise. She is still experiencing some SOB and will take breaks as needed. Will use PLB technique when she needs to. Will continue to monitor. Collie Siad has attended consistently this month.  Staff will review home exercise in the next few sessions. Collie Siad is progressing well and has increased speed on TM and level on T4 .  We will continue to monitor progress. Collie Siad is working towards goal speed of 2.8 on TM.  Her shortness of breath has been a 1 during exercise.  Staff discussed doing only concentric work during weights to reduce  muscle soreness.   Expected Outcomes Short: Use RPE daily to regulate intensity. Long: Follow program prescription in THR. Short:  Increase speed on TM Long: Continue to progress overall MET level Short: continue to exercise consistently Long:  increase overall stamina Short:  review home exercise with EP Long: add exercise on days not at [place]:  try changing up strength work to see if it reduces soreness Long:  improve overall stamina   Row Name 10/21/20 1305 11/04/20 0914 11/14/20 1339         Exercise Goal Re-Evaluation   Exercise Goals Review Increase Physical Activity;Increase Strength and Stamina Increase Physical Activity;Increase Strength and Stamina Increase Physical Activity;Increase Strength and Stamina;Understanding of Exercise Prescription     Comments Collie Siad has attended twice per week consistently this month.  She is up to 2.6 mph on TM.  She still has muscle soreness after exercise beyond what is normal.  Staff will monitor progress. Collie Siad tried 2.7 on TM.  Oxygen stays in upper 990s during exercise.  We will continue to monitor progress. Collie Siad is doing well in rehab.  This past week her breathing has not feeling like it getting better and exercise was harder already.  She is not been able to exercise at home with weather.  She is planning to use her daughter's home gym after graduation. She does not feel liek her stamina has really improved, she had a stretch of feeling better but then she is falling asleep at home early evenings.     Expected Outcomes Short:try to get 3 exercise sessions a week Long: improve overall stamina Short: get 3 sessions per week Long: increase MET level and stamina --            Discharge Exercise Prescription (Final Exercise Prescription Changes):  Exercise Prescription Changes - 11/04/20 0900      Response to Exercise   Blood Pressure (Admit) 144/86    Blood Pressure (Exercise) 182/84    Blood Pressure (Exit) 122/68    Heart Rate (Admit) 80 bpm     Heart Rate (Exercise) 108 bpm    Heart Rate (Exit) 84 bpm    Oxygen Saturation (Admit) 96 %    Oxygen Saturation (Exercise) 98 %    Oxygen Saturation (Exit) 98 %    Rating of Perceived Exertion (Exercise) 14    Perceived Dyspnea (Exercise) 3    Duration Continue with 30 min of aerobic exercise without signs/symptoms of physical distress.    Intensity THRR unchanged      Progression   Progression Continue to progress workloads to maintain intensity without signs/symptoms of physical distress.    Average METs 2.8      Resistance Training   Training Prescription Yes    Weight 3 lb    Reps 10-15      Treadmill   MPH 2.7    Grade 1    Minutes 15    METs 3.53      T5 Nustep   Level 2    SPM 80    Minutes 15    METs 2           Nutrition:  Target Goals: Understanding of nutrition guidelines, daily intake of sodium <1572m, cholesterol <205m calories 30% from fat and 7% or  less from saturated fats, daily to have 5 or more servings of fruits and vegetables.  Education: All About Nutrition: -Group instruction provided by verbal, written material, interactive activities, discussions, models, and posters to present general guidelines for heart healthy nutrition including fat, fiber, MyPlate, the role of sodium in heart healthy nutrition, utilization of the nutrition label, and utilization of this knowledge for meal planning. Follow up email sent as well. Written material given at graduation. Flowsheet Row Pulmonary Rehab from 09/04/2020 in [facility] Card[facility] Pul[facility]hab  Date 09/04/20  Educator [facility]  Instruction Review Code 1- Verbalizes Understanding      Biometrics:  Pre Biometrics - 07/29/20 1620      Pre Biometrics   Height '5\' 5"'  (1.651 m)    Weight 180 lb 3.2 oz (81.7 kg)    BMI (Calculated) 29.99    Single Leg Stand 11.63 seconds            Nutrition Therapy Plan and Nutrition Goals:  Nutrition Therapy & Goals - 08/05/20 1313      Nutrition Therapy   Diet  Heart healthy, low Na, Pulmonary MNT    Drug/Food Interactions Statins/Certain Fruits    Protein (specify units) 75g    Fiber 25 grams    Whole Grain Foods 3 servings    Saturated Fats 12 max. grams    Fruits and Vegetables 5 servings/day    Sodium 1.5 grams      Personal Nutrition Goals   Nutrition Goal LT: weight increasing would like to maintain, improve breathing 7/10 now - when bad - 1-2 x/week sometimes after meals, rebuild microbiome    Comments Vitamin D prescription 50000 IUs. LDL has decreased and is now below or at 70. Low histamine diet. If B: oatmeal with raisins and brown sugar (at home blueberries) L: not big on lunch - at cafeteria (fish or meat that is more plain and two vegetables that are more plain) - will retain fluid (salt). Will try to not get restaurant food. Since COVID - will buy chicken, 93% beef - make hamburgers (freezes) (2-3x/week), pork roast, fressh fruits and vegetables usually comes bad from instant cart (carrots, potatoes, zucchini).  [place] chicken, salad, cheese, orange (canned). Tries to eat low sodium. snacks: choccolate and bag of chips, not eating lunch much. D: by 6pm most nights. chicken - chicken pie (makes own sauce with cheese, mushrooms, onions, carrots, bisquikc mix for crust), chicken and brown rice (chicken broth and cream cheese), hamburgers, meatloaf. likes 20 minute cooked meals. [place] ripple chips (low Na). water during the day. Appetite is low, taste and smell is altered. Will see doctor next monday for taste. Not a specific food that tastes different- it changes. Discussed adding two hearty snacks in the middle of the day; protein balls, tuna, bean salad, cucumbers/marys crackers/gluten free pretzles and hummus or other dip (she doesn't like garlic). Discussed greek yogurt after she sees doctor to see if she likes it. Collie Siad is gluten free, and allergic to tree nuts, eggs, peas, celery, milk, most raw vegetables - some cooked vegetables. discussed  seeds, protein balls, proats.      Intervention Plan   Intervention Prescribe, educate and counsel regarding individualized specific dietary modifications aiming towards targeted core components such as weight, hypertension, lipid management, diabetes, heart failure and other comorbidities.;Nutrition handout(s) given to patient.    Expected Outcomes Short Term Goal: Understand basic principles of dietary content, such as calories, fat, sodium, cholesterol and nutrients.;Short Term Goal: A plan has  been developed with personal nutrition goals set during dietitian appointment.;Long Term Goal: Adherence to prescribed nutrition plan.           Nutrition Assessments:  Nutrition Assessments - 07/29/20 1631      MEDFICTS Scores   Pre Score 17          MEDIFICTS Score Key:  ?70 Need to make dietary changes   40-70 Heart Healthy Diet  ? 40 Therapeutic Level Cholesterol Diet   Picture Your Plate Scores:  <41 Unhealthy dietary pattern with much room for improvement.  41-50 Dietary pattern unlikely to meet recommendations for good health and room for improvement.  51-60 More healthful dietary pattern, with some room for improvement.   >60 Healthy dietary pattern, although there may be some specific behaviors that could be improved.   Nutrition Goals Re-Evaluation:  Nutrition Goals Re-Evaluation    [place] Name 09/09/20 1620 10/10/20 1549 11/14/20 1351         Goals   Current Weight -- 181 lb (82.1 kg) --     Nutrition Goal LT: weight increasing would like to maintain, improve breathing 7/10 now - when bad - 1-2 x/week sometimes after meals, rebuild microbiome Lose Weight. Add snack and small meal breakfast     Comment She has met with dietitian is aware of changes to to make.  She is trying to balance her fruits and vegetables with what she is able to eat and what are low in histimaine responses.  She wants to get back to the store again but trying to find the best time to go.  She  really wants to get back [employer] which is local and can help. Collie Siad has been watching her sodium intake. She is adding fruits to her salads at home. Her steroids make it hard for her to lose weight. She is so busy at work that sometimes she does not eat enough. Collie Siad states that her diet is sporadic and she knows she needs to eat more. Collie Siad has been doing better about getting breakfast.  She is limited in what she can get from cafeteria and her taste buds are still recovering.  Her taste buds are still coming and going.  She continues to try to make time to eat at work.     Expected Outcome Short: Continue to add in fruits and vegetables  Long: Continue to make changes to help. Short: add a snack or a small meal for breakfast. Long: maintain a routine diet. Short: Continue to get breakfast each day  Long: Continue to work on diet.            Nutrition Goals Discharge (Final Nutrition Goals Re-Evaluation):  Nutrition Goals Re-Evaluation - 11/14/20 1351      Goals   Nutrition Goal Add snack and small meal breakfast    Comment Collie Siad has been doing better about getting breakfast.  She is limited in what she can get from cafeteria and her taste buds are still recovering.  Her taste buds are still coming and going.  She continues to try to make time to eat at work.    Expected Outcome Short: Continue to get breakfast each day  Long: Continue to work on diet.           Psychosocial: Target Goals: Acknowledge presence or absence of significant depression and/or stress, maximize coping skills, provide positive support system. Participant is able to verbalize types and ability to use techniques and skills needed for reducing stress and depression.  Education: Stress, Anxiety, and Depression - Group verbal and visual presentation to define topics covered.  Reviews how body is impacted by stress, anxiety, and depression.  Also discusses healthy ways to reduce stress and to treat/manage anxiety and  depression.  Written material given at graduation.   Education: Sleep Hygiene -Provides group verbal and written instruction about how sleep can affect your health.  Define sleep hygiene, discuss sleep cycles and impact of sleep habits. Review good sleep hygiene tips.    Initial Review & Psychosocial Screening:  Initial Psych Review & Screening - 07/29/20 1141      Initial Review   Current issues with Current Stress Concerns    Source of Stress Concerns Unable to perform yard/household activities    Comments Since COVID has been ongoing she deals with the daily stress of that. She has had Covid and has been dealing with some side effects. She has been more short of breath recently and needs some conditioning to help with her shortness of breath. She feels optimisitc about her health and wants to get better with exercise.      Family Dynamics   Good Support System? Yes    Comments She can look to her husband, dog, three daughters and co-workers for support.      Barriers   Psychosocial barriers to participate in program The patient should benefit from training in stress management and relaxation.      Screening Interventions   Interventions To provide support and resources with identified psychosocial needs;Provide feedback about the scores to participant;Encouraged to exercise    Expected Outcomes Short Term goal: Utilizing psychosocial counselor, staff and physician to assist with identification of specific Stressors or current issues interfering with healing process. Setting desired goal for each stressor or current issue identified.;Long Term Goal: Stressors or current issues are controlled or eliminated.;Short Term goal: Identification and review with participant of any Quality of Life or Depression concerns found by scoring the questionnaire.;Long Term goal: The participant improves quality of Life and PHQ9 Scores as seen by post scores and/or verbalization of changes            Quality of Life Scores:  Scores of 19 and below usually indicate a poorer quality of life in these areas.  A difference of  2-3 points is a clinically meaningful difference.  A difference of 2-3 points in the total score of the Quality of Life Index has been associated with significant improvement in overall quality of life, self-image, physical symptoms, and general health in studies assessing change in quality of life.  PHQ-9: Recent Review Flowsheet Data    Depression screen [facility] 2/9 07/29/2020 02/08/2020 03/28/2015   Decreased Interest 0 0 0   Down, Depressed, Hopeless 0 0 0   PHQ - 2 Score 0 0 0   Altered sleeping 0 - -   Tired, decreased energy 3  - -   Change in appetite 0 - -   Feeling bad or failure about yourself  0 - -   Trouble concentrating 0 - -   Moving slowly or fidgety/restless 0 - -   Suicidal thoughts 0 - -   PHQ-9 Score 3 - -   Difficult doing work/chores Not difficult at all - -     Interpretation of Total Score  Total Score Depression Severity:  1-4 = Minimal depression, 5-9 = Mild depression, 10-14 = Moderate depression, 15-19 = Moderately severe depression, 20-27 = Severe depression   Psychosocial Evaluation and Intervention:  Psychosocial Evaluation - 07/29/20 1147      Psychosocial Evaluation & Interventions   Interventions Encouraged to exercise with the program and follow exercise prescription;Stress management education;Relaxation education    Comments Since COVID has been ongoing she deals with the daily stress of that. She has had Covid and has been dealing with some side effects. She has been more short of breath recently and needs some conditioning to help with her shortness of breath. She feels optimisitc about her health and wants to get better with exercise.    Expected Outcomes Short: Attend [facility] to improve move. Long: maintain a positive outllok on health and graduate [facility].    Continue Psychosocial Services  Follow up required by staff            Psychosocial Re-Evaluation:  Psychosocial Re-Evaluation    [place] Name 08/22/20 1437 09/09/20 1614 10/10/20 1557 11/14/20 1348       Psychosocial Re-Evaluation   Current issues with Current Stress Concerns Current Stress Concerns Current Stress Concerns Current Stress Concerns    Comments She gets stressed with not being able to breath at times and gets short of breath. Collie Siad has a positive outlook on her health and wants to get healthier. Collie Siad is doing well mentally.  We made it through [place] which is a weight off her shoulders.  She still gets frustrated with not being able to do what she wants. Since COVID, she is sleeping better than ever.   She still gets up some at night, but is able to get back to sleep easily.  She continues to work on her breathing.  Her biggest stressor is still not being able to see her grandkids and hug them.  It has been over 3 years since she saw them. Collie Siad is stressed about Christmas since she has not seen her daughter in three years. She does not want to talk about it becuase she may end up crying. She has taken off in June to try to make it work. Collie Siad is still working on her stress.  She misses her kids and grandkids.  She is trying to find time and things to do with them to be more engaging.  She also has a lot of stress at work recently with the weather and scheduling with staff.  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  She continues to sleep well but still having the vivid dreams as well.  She is sleeping better now and falls asleep early in the evening.    Expected Outcomes Short: continue to exercise to improve shortness of breath and mood. Long: maintain exercise post [facility] to keep stress at a minimum. Short: Continue to work on breathing and stamina  Long; Continue to talk to girls over the phone. Short: continue to exercise to keep stress at a minimum. Long: maintain exercise independently to keep stress  minimal. Short: Exercise for stress relief. Long: Continue to aim for positive thoughts    Interventions Encouraged to attend Pulmonary Rehabilitation for the exercise Encouraged to attend Pulmonary Rehabilitation for the exercise Encouraged to attend Pulmonary Rehabilitation for the exercise Encouraged to attend Pulmonary Rehabilitation for the exercise    Continue Psychosocial Services  Follow up required by staff Follow up required by staff Follow up required by staff Follow up required by staff         Initial Review   Source of Stress Concerns -- Unable to perform yard/household activities -- --  Psychosocial Discharge (Final Psychosocial Re-Evaluation):  Psychosocial Re-Evaluation - 11/14/20 1348      Psychosocial Re-Evaluation   Current issues with Current Stress Concerns    Comments Collie Siad is still working on her stress.  She misses her kids and grandkids.  She is trying to find time and things to do with them to be more engaging.  She also has a lot of stress at work recently with the weather and scheduling with staff.  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  She continues to sleep well but still having the vivid dreams as well.  She is sleeping better now and falls asleep early in the evening.    Expected Outcomes Short: Exercise for stress relief. Long: Continue to aim for positive thoughts    Interventions Encouraged to attend Pulmonary Rehabilitation for the exercise    Continue Psychosocial Services  Follow up required by staff           Education: Education Goals: Education classes will be provided on a weekly basis, covering required topics. Participant will state understanding/return demonstration of topics presented.  Learning Barriers/Preferences:  Learning Barriers/Preferences - 07/29/20 1139      Learning Barriers/Preferences   Learning Barriers Sight    Learning Preferences None           General Pulmonary  Education Topics:  Infection Prevention: - Provides verbal and written material to individual with discussion of infection control including proper hand washing and proper equipment cleaning during exercise session. Flowsheet Row Pulmonary Rehab from 09/04/2020 in [facility] Cardiac and Pulmonary Rehab  Date 07/29/20  Educator [facility]  Instruction Review Code 1- Verbalizes Understanding      Falls Prevention: - Provides verbal and written material to individual with discussion of falls prevention and safety. Flowsheet Row Pulmonary Rehab from 09/04/2020 in [facility] Cardiac and Pulmonary Rehab  Date 07/29/20  Educator [facility]  Instruction Review Code 1- Verbalizes Understanding      Chronic Lung Disease Review: - Group verbal instruction with posters, models, PowerPoint presentations and videos,  to review new updates, new respiratory medications, new advancements in procedures and treatments. Providing information on websites and "800" numbers for continued self-education. Includes information about supplement oxygen, available portable oxygen systems, continuous and intermittent flow rates, oxygen safety, concentrators, and Medicare reimbursement for oxygen. Explanation of Pulmonary Drugs, including class, frequency, complications, importance of spacers, rinsing mouth after steroid MDI's, and proper cleaning methods for nebulizers. Review of basic lung anatomy and physiology related to function, structure, and complications of lung disease. Review of risk factors. Discussion about methods for diagnosing sleep apnea and types of masks and machines for OSA. Includes a review of the use of types of environmental controls: home humidity, furnaces, filters, dust mite/pet prevention, HEPA vacuums. Discussion about weather changes, air quality and the benefits of nasal washing. Instruction on Warning signs, infection symptoms, calling MD promptly, preventive modes, and value of vaccinations. Review of effective airway  clearance, coughing and/or vibration techniques. Emphasizing that all should Create an Action Plan. Written material given at graduation.   AED/CPR: - Group verbal and written instruction with the use of models to demonstrate the basic use of the AED with the basic ABC's of resuscitation.    Anatomy and Cardiac Procedures: - Group verbal and visual presentation and models provide information about basic cardiac anatomy and function. Reviews the testing methods done to diagnose heart disease and the outcomes of the test results. Describes the treatment choices:  Medical Management, Angioplasty, or Coronary Bypass Surgery for treating various heart conditions including Myocardial Infarction, Angina, Valve Disease, and Cardiac Arrhythmias.  Written material given at graduation.   Medication Safety: - Group verbal and visual instruction to review commonly prescribed medications for heart and lung disease. Reviews the medication, class of the drug, and side effects. Includes the steps to properly store meds and maintain the prescription regimen.  Written material given at graduation.   Other: -Provides group and verbal instruction on various topics (see comments)   Knowledge Questionnaire Score:  Knowledge Questionnaire Score - 07/29/20 1631      Knowledge Questionnaire Score   Pre Score 18/18            Core Components/Risk Factors/Patient Goals at Admission:  Personal Goals and Risk Factors at Admission - 07/29/20 1634      Core Components/Risk Factors/Patient Goals on Admission    Weight Management Yes;Weight Loss    Intervention Weight Management: Develop a combined nutrition and exercise program designed to reach desired caloric intake, while maintaining appropriate intake of nutrient and fiber, sodium and fats, and appropriate energy expenditure required for the weight goal.;Weight Management: Provide education and appropriate resources to help participant work on and attain  dietary goals.;Weight Management/Obesity: Establish reasonable short term and long term weight goals.    Admit Weight 180 lb 3.2 oz (81.7 kg)    Goal Weight: Short Term 175 lb (79.4 kg)    Goal Weight: Long Term 170 lb (77.1 kg)    Expected Outcomes Short Term: Continue to assess and modify interventions until short term weight is achieved;Long Term: Adherence to nutrition and physical activity/exercise program aimed toward attainment of established weight goal;Weight Maintenance: Understanding of the daily nutrition guidelines, which includes 25-35% calories from fat, 7% or less cal from saturated fats, less than 279m cholesterol, less than 1.5gm of sodium, & 5 or more servings of fruits and vegetables daily;Weight Loss: Understanding of general recommendations for a balanced deficit meal plan, which promotes 1-2 lb weight loss per week and includes a negative energy balance of [phone] kcal/d;Understanding recommendations for meals to include 15-35% energy as protein, 25-35% energy from fat, 35-60% energy from carbohydrates, less than 2027mof dietary cholesterol, 20-35 gm of total fiber daily;Understanding of distribution of calorie intake throughout the day with the consumption of 4-5 meals/snacks    Improve shortness of breath with ADL's Yes    Intervention Provide education, individualized exercise plan and daily activity instruction to help decrease symptoms of SOB with activities of daily living.    Expected Outcomes Short Term: Improve cardiorespiratory fitness to achieve a reduction of symptoms when performing ADLs;Long Term: Be able to perform more ADLs without symptoms or delay the onset of symptoms    Hypertension Yes    Intervention Provide education on lifestyle modifcations including regular physical activity/exercise, weight management, moderate sodium restriction and increased consumption of fresh fruit, vegetables, and low fat dairy, alcohol moderation, and smoking cessation.;Monitor  prescription use compliance.    Expected Outcomes Short Term: Continued assessment and intervention until BP is < 140/9031mG in hypertensive participants. < 130/74m88m in hypertensive participants with diabetes, heart failure or chronic kidney disease.;Long Term: Maintenance of blood pressure at goal levels.    Lipids Yes    Intervention Provide education and support for participant on nutrition & aerobic/resistive exercise along with prescribed medications to achieve LDL <70mg72mL >40mg.84mExpected Outcomes Short Term: Participant states understanding of desired cholesterol values and is  compliant with medications prescribed. Participant is following exercise prescription and nutrition guidelines.;Long Term: Cholesterol controlled with medications as prescribed, with individualized exercise RX and with personalized nutrition plan. Value goals: LDL < 108m, HDL > 40 mg.           Education:Diabetes - Individual verbal and written instruction to review signs/symptoms of diabetes, desired ranges of glucose level fasting, after meals and with exercise. Acknowledge that pre and post exercise glucose checks will be done for 3 sessions at entry of program.   Know Your Numbers and Heart Failure: - Group verbal and visual instruction to discuss disease risk factors for cardiac and pulmonary disease and treatment options.  Reviews associated critical values for Overweight/Obesity, Hypertension, Cholesterol, and Diabetes.  Discusses basics of heart failure: signs/symptoms and treatments.  Introduces Heart Failure Zone chart for action plan for heart failure.  Written material given at graduation.   Core Components/Risk Factors/Patient Goals Review:   Goals and Risk Factor Review    Row Name 08/22/20 1429 09/09/20 1617 10/10/20 1547 11/14/20 1345       Core Components/Risk Factors/Patient Goals Review   Personal Goals Review Weight Management/Obesity;Improve shortness of breath with ADL's Weight  Management/Obesity;Improve shortness of breath with ADL's;Hypertension Weight Management/Obesity;Improve shortness of breath with ADL's Weight Management/Obesity;Improve shortness of breath with ADL's;Hypertension    Review If sue gets up at night and walks up the three steps to go to the bathroom she will get short of breath. She is going to try to sit up first take a few deep breaths then get up. Work on PLB while doing strenous activities. SCollie Siadis maintaining her weight.  She is usually up and down 3-4 lbs.  The predinisone is not helping and she is salt sensitive.  She has tried to start taking her Lasix a little more frequently to help.  Her breathing is starting to improve, but it is still there and come on strong.  Her blood pressures are doing better but are still high in doctor's office 140/92, but in class they are doing well. SCollie Siadis working on losing weight and has been gaining. She is taking steriods which makes it hard for her to lose weight.Spoke to patient about their shortness of breath and what they can do to improve. Patient has been informed of breathing techniques when starting the program. Patient is informed to tell staff if they have had any med changes and that certain meds they are taking or not taking can be causing shortness of breath. SCollie Siadhas a primary care visit on Monday.  Her weight is relatively stable it goes up and down with fluid levels.   Her blood pressures are high prior to exercise, but good with recovery.  She is checking it fairly routinely.  She is doing well with her meds.  Her breathing has been getting worse and is now happening at rest too. She is going to talk to doctor about it and send note to pulmonologist.    Expected Outcomes Short: Work on PLB when doing ADL's. Long: maintain breathing techniques at home independently. Short: Continue to work [facility]; Continue to improve breathing. Short: Attend [facility] regularly to improve shortness of breath  with ADL's. Long: maintain independence with ADL's Short: Talk to doc about breathing Long: Continue to work on risk factors.           Core Components/Risk Factors/Patient Goals at Discharge (Final Review):   Goals and Risk Factor Review - 11/14/20 1345  Core Components/Risk Factors/Patient Goals Review   Personal Goals Review Weight Management/Obesity;Improve shortness of breath with ADL's;Hypertension    Review Collie Siad has a primary care visit on Monday.  Her weight is relatively stable it goes up and down with fluid levels.   Her blood pressures are high prior to exercise, but good with recovery.  She is checking it fairly routinely.  She is doing well with her meds.  Her breathing has been getting worse and is now happening at rest too. She is going to talk to doctor about it and send note to pulmonologist.    Expected Outcomes Short: Talk to doc about breathing Long: Continue to work on risk factors.           ITP Comments:  ITP Comments    Row Name 07/29/20 1159 07/29/20 1634 07/31/20 0733 08/05/20 1525 08/07/20 1620   ITP Comments In person Visit completed. Patient informed on EP and RD appointment and 6 Minute walk test. Patient also informed of patient health questionnaires on My Chart. Patient Verbalizes understanding. Visit diagnosis [facility] found in [facility]. Completed 6MWT and gym orientation. Initial ITP created and sent for review to Dr. Emily Filbert, Medical Director. 30 day review completed. ITP sent to Dr. Emily Filbert, Medical Director of Cardiac and Pulmonary Rehab. Continue with ITP unless changes are made by physician.  Patient is new to program. Completed Initial RD Evaluation First full day of exercise!  Patient was oriented to gym and equipment including functions, settings, policies, and procedures.  Patient's individual exercise prescription and treatment plan were reviewed.  All starting workloads were established based on the results of the 6 minute walk test done  at initial orientation visit.  The plan for exercise progression was also introduced and progression will be customized based on patient's performance and goals.   Row Name 08/28/20 0749 09/25/20 1002 10/23/20 0825 11/20/20 0725     ITP Comments 30 day review completed. ITP sent to Dr. Emily Filbert, Medical Director of Cardiac and Pulmonary Rehab. Continue with ITP unless changes are made by physician. 30 Day review completed. Medical Director ITP review done, changes made as directed, and signed approval by Medical Director. 30 Day review completed. Medical Director ITP review done, changes made as directed, and signed approval by Medical Director. 30 day review completed. ITP sent to Dr. Emily Filbert, Medical Director of Cardiac and Pulmonary Rehab. Continue with ITP unless changes are made by physician.           Comments: 30 day review

## 2020-11-20 NOTE — Progress Notes (Signed)
Daily Session Note  Patient Details  Name: Rachel Fry MRN: [id number] Date of Birth: Mar 07, 1956 Referring Provider:   Flowsheet Row Pulmonary Rehab from 07/29/2020 in [facility] Cardiac and Pulmonary Rehab  Referring Provider Praveen      Encounter Date: 11/20/2020  Check In:  Session Check In - 11/20/20 1404      Check-In   Supervising physician immediately available to respond to emergencies See telemetry face sheet for immediately available ER MD    Location [facility]-Cardiac & Pulmonary Rehab    Staff Present Birdie Sons, MPA, Elveria Rising, BA, ACSM CEP, Exercise Physiologist;Kara Eliezer Bottom, MS Exercise Physiologist    Virtual Visit No    Medication changes reported     No    Fall or balance concerns reported    No    Warm-up and Cool-down Performed on first and last piece of equipment    Resistance Training Performed Yes    VAD Patient? No    PAD/SET Patient? No      Pain Assessment   Currently in Pain? No/denies              Social History   Tobacco Use  Smoking Status Never Smoker  Smokeless Tobacco Never Used    Goals Met:  Independence with exercise equipment Exercise tolerated well No report of cardiac concerns or symptoms Strength training completed today  Goals Unmet:  Not Applicable  Comments: Pt able to follow exercise prescription today without complaint.  Will continue to monitor for progression.    Dr. Emily Filbert is Medical Director for [place] and [facility].

## 2020-11-21 ENCOUNTER — Other Ambulatory Visit: Payer: Self-pay

## 2020-11-21 ENCOUNTER — Encounter: Payer: 59 | Admitting: *Deleted

## 2020-11-21 DIAGNOSIS — R0600 Dyspnea, unspecified: Secondary | ICD-10-CM | POA: Diagnosis not present

## 2020-11-21 NOTE — Progress Notes (Signed)
Daily Session Note  Patient Details  Name: Rachel Fry MRN: [id number] Date of Birth: 1956/02/03 Referring Provider:   Flowsheet Row Pulmonary Rehab from 07/29/2020 in [facility] Cardiac and Pulmonary Rehab  Referring Provider Praveen      Encounter Date: 11/21/2020  Check In:  Session Check In - 11/21/20 1555      Check-In   Supervising physician immediately available to respond to emergencies See telemetry face sheet for immediately available ER MD    Location [facility]-Cardiac & Pulmonary Rehab    Staff Present Renita Papa, RN BSN;Joseph [address] [place], [place], RCEP, CCRP, CCET    Virtual Visit No    Medication changes reported     Yes    Comments temporarily on prednisone, added torsemide & vascepa, off tegretol    Fall or balance concerns reported    No    Warm-up and Cool-down Performed on first and last piece of equipment    Resistance Training Performed Yes    VAD Patient? No    PAD/SET Patient? No      Pain Assessment   Currently in Pain? No/denies              Social History   Tobacco Use  Smoking Status Never Smoker  Smokeless Tobacco Never Used    Goals Met:  Independence with exercise equipment Exercise tolerated well No report of cardiac concerns or symptoms Strength training completed today  Goals Unmet:  Not Applicable  Comments: Pt able to follow exercise prescription today without complaint.  Will continue to monitor for progression.    Dr. Emily Filbert is Medical Director for [place] and [facility].

## 2020-11-25 ENCOUNTER — Other Ambulatory Visit: Payer: Self-pay

## 2020-11-25 VITALS — Ht 66.0 in | Wt 184.0 lb

## 2020-11-25 DIAGNOSIS — R0600 Dyspnea, unspecified: Secondary | ICD-10-CM

## 2020-11-25 NOTE — Progress Notes (Signed)
Daily Session Note  Patient Details  Name: AYELEN SCIORTINO MRN: [id number] Date of Birth: Jun 26, 1956 Referring Provider:   Flowsheet Row Pulmonary Rehab from 07/29/2020 in [facility] Cardiac and Pulmonary Rehab  Referring Provider Praveen      Encounter Date: 11/25/2020  Check In:  Session Check In - 11/25/20 1537      Check-In   Supervising physician immediately available to respond to emergencies See telemetry face sheet for immediately available ER MD    Location [facility]-Cardiac & Pulmonary Rehab    Staff Present Birdie Sons, MPA, Mauricia Area, BS, ACSM CEP, Exercise Physiologist;Laureen Owens Shark, BS, RRT, CPFT    Virtual Visit No    Medication changes reported     No    Fall or balance concerns reported    No    Warm-up and Cool-down Performed on first and last piece of equipment    Resistance Training Performed Yes    VAD Patient? No    PAD/SET Patient? No      Pain Assessment   Currently in Pain? No/denies              Social History   Tobacco Use  Smoking Status Never Smoker  Smokeless Tobacco Never Used    Goals Met:  Independence with exercise equipment Exercise tolerated well No report of cardiac concerns or symptoms Strength training completed today  Goals Unmet:  Not Applicable  Comments: Pt able to follow exercise prescription today without complaint.  Will continue to monitor for progression.   [place] Name 07/29/20 1622 11/25/20 1553       6 Minute Walk   Phase Initial Discharge    Distance 1480 feet 1400 feet    Distance % Change -- -5 %    Distance Feet Change -- -80 ft    Walk Time 6 minutes 6 minutes    # of Rest Breaks 0 0    MPH 2.8 2.65    METS 3.6 3.18    RPE 13 14    Perceived Dyspnea  2 3    VO2 Peak 12.6 11.15    Symptoms No Yes (comment)    Comments -- SOB    Resting HR 81 bpm 76 bpm    Resting BP 134/78 138/86    Resting Oxygen Saturation  97 % 97 %    Exercise Oxygen Saturation  during 6 min walk 95 % 96 %     Max Ex. HR 105 bpm 99 bpm    Max Ex. BP 154/84 130/82    2 Minute Post BP 136/84 124/82         Interval HR   1 Minute HR 96 87    2 Minute HR 97 94    3 Minute HR 102 97    4 Minute HR 101 96    5 Minute HR 105 98    6 Minute HR 104 99    2 Minute Post HR 88 80    Interval Heart Rate? Yes Yes         Interval Oxygen   Interval Oxygen? Yes Yes    Baseline Oxygen Saturation % 97 % 97 %    1 Minute Oxygen Saturation % 96 % 96 %    1 Minute Liters of Oxygen 0 L 0 L    2 Minute Oxygen Saturation % 96 % 96 %    2 Minute Liters of Oxygen 0 L 0 L    3  Minute Oxygen Saturation % 95 % 96 %    3 Minute Liters of Oxygen 0 L 0 L    4 Minute Oxygen Saturation % 96 % 96 %    4 Minute Liters of Oxygen 0 L 0 L    5 Minute Oxygen Saturation % 97 % 96 %    5 Minute Liters of Oxygen 0 L 0 L    6 Minute Oxygen Saturation % 94 % 97 %    6 Minute Liters of Oxygen 0 L 0 L    2 Minute Post Oxygen Saturation % 96 % 97 %    2 Minute Post Liters of Oxygen 0 L 0 L             Dr. Emily Filbert is Medical Director for [place] and [facility].

## 2020-11-28 ENCOUNTER — Encounter: Payer: 59 | Attending: Pulmonary Disease | Admitting: *Deleted

## 2020-11-28 ENCOUNTER — Other Ambulatory Visit: Payer: Self-pay

## 2020-11-28 DIAGNOSIS — R0600 Dyspnea, unspecified: Secondary | ICD-10-CM | POA: Diagnosis not present

## 2020-11-28 MED FILL — XOLAIR 150 MG SOLR: 150 | 28 days supply | Qty: 2 | Fill #5

## 2020-11-28 NOTE — Progress Notes (Signed)
Daily Session Note  Patient Details  Name: FRANCINA BEERY MRN: [id number] Date of Birth: 05/24/1956 Referring Provider:   Flowsheet Row Pulmonary Rehab from 07/29/2020 in [facility] Cardiac and Pulmonary Rehab  Referring Provider [place]      Encounter Date: 11/28/2020  Check In:  Session Check In - 11/28/20 1357      Check-In   Supervising physician immediately available to respond to emergencies See telemetry face sheet for immediately available ER MD    Location [facility]-Cardiac & Pulmonary Rehab    Staff Present Renita Papa, RN BSN;Joseph [address] [place], [place], [place], CCRP, CCET    Virtual Visit No    Medication changes reported     No    Fall or balance concerns reported    No    Warm-up and Cool-down Performed on first and last piece of equipment    Resistance Training Performed Yes    VAD Patient? No    PAD/SET Patient? No      Pain Assessment   Currently in Pain? No/denies              Social History   Tobacco Use  Smoking Status Never Smoker  Smokeless Tobacco Never Used    Goals Met:  Independence with exercise equipment Exercise tolerated well No report of cardiac concerns or symptoms Strength training completed today  Goals Unmet:  Not Applicable  Comments: Pt able to follow exercise prescription today without complaint.  Will continue to monitor for progression.    Dr. Emily Filbert is Medical Director for [place] and [facility].

## 2020-12-02 ENCOUNTER — Other Ambulatory Visit: Payer: Self-pay

## 2020-12-02 DIAGNOSIS — R0600 Dyspnea, unspecified: Secondary | ICD-10-CM

## 2020-12-02 MED FILL — ATORVASTATIN CALCIUM 20 MG: 20 | 90 days supply | Qty: 90 | Fill #2

## 2020-12-02 NOTE — Progress Notes (Signed)
Daily Session Note  Patient Details  Name: Rachel Fry MRN: [id number] Date of Birth: Aug 15, 1956 Referring Provider:   Flowsheet Row Pulmonary Rehab from 07/29/2020 in [facility] Cardiac and Pulmonary Rehab  Referring Provider Praveen      Encounter Date: 12/02/2020  Check In:  Session Check In - 12/02/20 1540      Check-In   Supervising physician immediately available to respond to emergencies See telemetry face sheet for immediately available ER MD    Location [facility]-Cardiac & Pulmonary Rehab    Staff Present Birdie Sons, MPA, Mauricia Area, BS, ACSM CEP, Exercise Physiologist;Kara Eliezer Bottom, MS Exercise Physiologist    Virtual Visit No    Medication changes reported     No    Fall or balance concerns reported    No    Warm-up and Cool-down Performed on first and last piece of equipment    Resistance Training Performed Yes    VAD Patient? No    PAD/SET Patient? No      Pain Assessment   Currently in Pain? No/denies              Social History   Tobacco Use  Smoking Status Never Smoker  Smokeless Tobacco Never Used    Goals Met:  Independence with exercise equipment Exercise tolerated well No report of cardiac concerns or symptoms Strength training completed today  Goals Unmet:  Not Applicable  Comments: Pt able to follow exercise prescription today without complaint.  Will continue to monitor for progression.    Dr. Emily Filbert is Medical Director for [place] and [facility].

## 2020-12-03 ENCOUNTER — Ambulatory Visit (INDEPENDENT_AMBULATORY_CARE_PROVIDER_SITE_OTHER): Payer: 59 | Admitting: *Deleted

## 2020-12-03 ENCOUNTER — Other Ambulatory Visit: Payer: Self-pay

## 2020-12-03 DIAGNOSIS — L501 Idiopathic urticaria: Secondary | ICD-10-CM | POA: Diagnosis not present

## 2020-12-04 NOTE — Telephone Encounter (Signed)
Ins has not responded but she is now due for re-approval and will attempt againg

## 2020-12-06 ENCOUNTER — Other Ambulatory Visit (HOSPITAL_COMMUNITY): Payer: Self-pay | Admitting: Psychiatry

## 2020-12-06 MED FILL — NURTEC 75 MG TBDP: 75 | 30 days supply | Qty: 8 | Fill #1

## 2020-12-06 MED FILL — FROVATRIPTAN SUCCINATE 2.5: 2.5 | 30 days supply | Qty: 18 | Fill #0

## 2020-12-09 ENCOUNTER — Other Ambulatory Visit: Payer: Self-pay

## 2020-12-09 DIAGNOSIS — R0600 Dyspnea, unspecified: Secondary | ICD-10-CM

## 2020-12-09 MED FILL — EZETIMIBE 10 MG TABS: 10 | 90 days supply | Qty: 90 | Fill #2

## 2020-12-09 NOTE — Progress Notes (Signed)
Daily Session Note  Patient Details  Name: Rachel Fry MRN: [id number] Date of Birth: [birth year] Referring Provider:   Flowsheet Row Pulmonary Rehab from 07/29/2020 in [facility] Cardiac and Pulmonary Rehab  Referring Provider Praveen      Encounter Date: 12/09/2020  Check In:  Session Check In - 12/09/20 1539      Check-In   Supervising physician immediately available to respond to emergencies See telemetry face sheet for immediately available ER MD    Location [facility]-Cardiac & Pulmonary Rehab    Staff Present Birdie Sons, MPA, Mauricia Area, BS, ACSM CEP, Exercise Physiologist;Kara Eliezer Bottom, MS Exercise Physiologist    Virtual Visit No    Medication changes reported     No    Fall or balance concerns reported    No    Warm-up and Cool-down Performed on first and last piece of equipment    Resistance Training Performed Yes    VAD Patient? No    PAD/SET Patient? No      Pain Assessment   Currently in Pain? No/denies              Social History   Tobacco Use  Smoking Status Never Smoker  Smokeless Tobacco Never Used    Goals Met:  Independence with exercise equipment Exercise tolerated well No report of cardiac concerns or symptoms Strength training completed today  Goals Unmet:  Not Applicable  Comments: Pt able to follow exercise prescription today without complaint.  Will continue to monitor for progression.    Dr. Emily Filbert is Medical Director for [place] and [facility].

## 2020-12-11 DIAGNOSIS — L821 Other seborrheic keratosis: Secondary | ICD-10-CM | POA: Diagnosis not present

## 2020-12-11 DIAGNOSIS — D2372 Other benign neoplasm of skin of left lower limb, including hip: Secondary | ICD-10-CM | POA: Diagnosis not present

## 2020-12-12 MED FILL — AIMOVIG 140 MG/ML SOAJ: 140 | 30 days supply | Qty: 1 | Fill #5

## 2020-12-16 ENCOUNTER — Other Ambulatory Visit: Payer: Self-pay

## 2020-12-16 DIAGNOSIS — R0600 Dyspnea, unspecified: Secondary | ICD-10-CM

## 2020-12-16 NOTE — Progress Notes (Signed)
Daily Session Note  Patient Details  Name: Rachel Fry MRN: [id number] Date of Birth: Feb 26, 1956 Referring Provider:   Flowsheet Row Pulmonary Rehab from 07/29/2020 in [facility] Cardiac and Pulmonary Rehab  Referring Provider Praveen      Encounter Date: 12/16/2020  Check In:  Session Check In - 12/16/20 1408      Check-In   Supervising physician immediately available to respond to emergencies See telemetry face sheet for immediately available ER MD    Location [facility]-Cardiac & Pulmonary Rehab    Staff Present Birdie Sons, MPA, Mauricia Area, BS, ACSM CEP, Exercise Physiologist;Kara Eliezer Bottom, MS Exercise Physiologist    Virtual Visit No    Medication changes reported     No    Fall or balance concerns reported    No    Warm-up and Cool-down Performed on first and last piece of equipment    Resistance Training Performed Yes    VAD Patient? No    PAD/SET Patient? No      Pain Assessment   Currently in Pain? No/denies              Social History   Tobacco Use  Smoking Status Never Smoker  Smokeless Tobacco Never Used    Goals Met:  Independence with exercise equipment Exercise tolerated well No report of cardiac concerns or symptoms Strength training completed today  Goals Unmet:  Not Applicable  Comments: Pt able to follow exercise prescription today without complaint.  Will continue to monitor for progression.    Dr. Emily Filbert is Medical Director for [place] and [facility].

## 2020-12-17 NOTE — Patient Instructions (Signed)
Discharge Patient Instructions  Patient Details  Name: Rachel Fry MRN: [id number] Date of Birth: 26-Mar-1956 Referring Provider:  Marshell Garfinkel, MD   Number of Visits: 95  Reason for Discharge:  Patient reached a stable level of exercise.  Smoking History:  Social History   Tobacco Use  Smoking Status Never Smoker  Smokeless Tobacco Never Used    Diagnosis:  Dyspnea, unspecified type  Initial Exercise Prescription:  Initial Exercise Prescription - 07/29/20 1600      Date of Initial Exercise RX and Referring Provider   Date 07/29/20    Referring Provider Praveen      Treadmill   MPH 2.8    Grade 1    Minutes 15    METs 3.6      Recumbant Bike   Level 1    RPM 60    Minutes 15    METs 3.6      NuStep   Level 3    SPM 80    Minutes 15    METs 3.6      Prescription Details   Frequency (times per week) 3    Duration Progress to 30 minutes of continuous aerobic without signs/symptoms of physical distress      Intensity   THRR 40-80% of Max Heartrate 110-140    Ratings of Perceived Exertion 11-15    Perceived Dyspnea 0-4      Resistance Training   Training Prescription Yes    Weight 2 lb    Reps 10-15           Discharge Exercise Prescription (Final Exercise Prescription Changes):  Exercise Prescription Changes - 12/17/20 0800      Response to Exercise   Blood Pressure (Admit) 136/78    Blood Pressure (Exercise) 154/80    Blood Pressure (Exit) 130/80    Heart Rate (Admit) 71 bpm    Heart Rate (Exercise) 98 bpm    Heart Rate (Exit) 74 bpm    Oxygen Saturation (Admit) 97 %    Oxygen Saturation (Exercise) 97 %    Oxygen Saturation (Exit) 96 %    Rating of Perceived Exertion (Exercise) 14    Perceived Dyspnea (Exercise) 3    Symptoms SOB    Duration Continue with 30 min of aerobic exercise without signs/symptoms of physical distress.    Intensity THRR unchanged      Progression   Progression Continue to progress workloads to maintain  intensity without signs/symptoms of physical distress.    Average METs 2.98      Resistance Training   Training Prescription Yes    Weight 3 lb    Reps 10-15      Interval Training   Interval Training No      Treadmill   MPH 2.4    Grade 1    Minutes 15    METs 3.17      NuStep   Level 4    Minutes 15    METs 2.8      Home Exercise Plan   Plans to continue exercise at Home (comment)   walking, treadmill, daughter's home gym   Frequency Add 2 additional days to program exercise sessions.    Initial Home Exercises Provided 09/09/20           Functional Capacity:  6 Minute Walk    Row Name 07/29/20 1622 11/25/20 1553       6 Minute Walk   Phase Initial Discharge    Distance 1480 feet 1400  feet    Distance % Change -- -5 %    Distance Feet Change -- -80 ft    Walk Time 6 minutes 6 minutes    # of Rest Breaks 0 0    MPH 2.8 2.65    METS 3.6 3.18    RPE 13 14    Perceived Dyspnea  2 3    VO2 Peak 12.6 11.15    Symptoms No Yes (comment)    Comments -- SOB    Resting HR 81 bpm 76 bpm    Resting BP 134/78 138/86    Resting Oxygen Saturation  97 % 97 %    Exercise Oxygen Saturation  during 6 min walk 95 % 96 %    Max Ex. HR 105 bpm 99 bpm    Max Ex. BP 154/84 130/82    2 Minute Post BP 136/84 124/82         Interval HR   1 Minute HR 96 87    2 Minute HR 97 94    3 Minute HR 102 97    4 Minute HR 101 96    5 Minute HR 105 98    6 Minute HR 104 99    2 Minute Post HR 88 80    Interval Heart Rate? Yes Yes         Interval Oxygen   Interval Oxygen? Yes Yes    Baseline Oxygen Saturation % 97 % 97 %    1 Minute Oxygen Saturation % 96 % 96 %    1 Minute Liters of Oxygen 0 L 0 L    2 Minute Oxygen Saturation % 96 % 96 %    2 Minute Liters of Oxygen 0 L 0 L    3 Minute Oxygen Saturation % 95 % 96 %    3 Minute Liters of Oxygen 0 L 0 L    4 Minute Oxygen Saturation % 96 % 96 %    4 Minute Liters of Oxygen 0 L 0 L    5 Minute Oxygen Saturation % 97 % 96 %     5 Minute Liters of Oxygen 0 L 0 L    6 Minute Oxygen Saturation % 94 % 97 %    6 Minute Liters of Oxygen 0 L 0 L    2 Minute Post Oxygen Saturation % 96 % 97 %    2 Minute Post Liters of Oxygen 0 L 0 L          Nutrition & Weight - Outcomes:  Pre Biometrics - 07/29/20 1620      Pre Biometrics   Height 5\' 5"  (1.651 m)    Weight 180 lb 3.2 oz (81.7 kg)    BMI (Calculated) 29.99    Single Leg Stand 11.63 seconds           Post Biometrics - 11/25/20 1616       Post  Biometrics   Height 5\' 6"  (1.676 m)    Weight 184 lb (83.5 kg)    BMI (Calculated) 29.71    Single Leg Stand 11.83 seconds           Nutrition:  Nutrition Therapy & Goals - 08/05/20 1313      Nutrition Therapy   Diet Heart healthy, low Na, Pulmonary MNT    Drug/Food Interactions Statins/Certain Fruits    Protein (specify units) 75g    Fiber 25 grams    Whole Grain Foods 3 servings  Saturated Fats 12 max. grams    Fruits and Vegetables 5 servings/day    Sodium 1.5 grams      Personal Nutrition Goals   Nutrition Goal LT: weight increasing would like to maintain, improve breathing 7/10 now - when bad - 1-2 x/week sometimes after meals, rebuild microbiome    Comments Vitamin D prescription 50000 IUs. LDL has decreased and is now below or at 70. Low histamine diet. If B: oatmeal with raisins and brown sugar (at home blueberries) L: not big on lunch - at cafeteria (fish or meat that is more plain and two vegetables that are more plain) - will retain fluid (salt). Will try to not get restaurant food. Since COVID - will buy chicken, 93% beef - make hamburgers (freezes) (2-3x/week), pork roast, fressh fruits and vegetables usually comes bad from instant cart (carrots, potatoes, zucchini).  Rotissere chicken, salad, cheese, orange (canned). Tries to eat low sodium. snacks: choccolate and bag of chips, not eating lunch much. D: by 6pm most nights. chicken - chicken pie (makes own sauce with cheese, mushrooms, onions,  carrots, bisquikc mix for crust), chicken and brown rice (chicken broth and cream cheese), hamburgers, meatloaf. likes 20 minute cooked meals. Utz ripple chips (low Na). water during the day. Appetite is low, taste and smell is altered. Will see doctor next monday for taste. Not a specific food that tastes different- it changes. Discussed adding two hearty snacks in the middle of the day; protein balls, tuna, bean salad, cucumbers/marys crackers/gluten free pretzles and hummus or other dip (she doesn't like garlic). Discussed greek yogurt after she sees doctor to see if she likes it. Collie Siad is gluten free, and allergic to tree nuts, eggs, peas, celery, milk, most raw vegetables - some cooked vegetables. discussed seeds, protein balls, proats.      Intervention Plan   Intervention Prescribe, educate and counsel regarding individualized specific dietary modifications aiming towards targeted core components such as weight, hypertension, lipid management, diabetes, heart failure and other comorbidities.;Nutrition handout(s) given to patient.    Expected Outcomes Short Term Goal: Understand basic principles of dietary content, such as calories, fat, sodium, cholesterol and nutrients.;Short Term Goal: A plan has been developed with personal nutrition goals set during dietitian appointment.;Long Term Goal: Adherence to prescribed nutrition plan.           Goals reviewed with patient; copy given to patient.

## 2020-12-18 ENCOUNTER — Other Ambulatory Visit: Payer: Self-pay

## 2020-12-18 ENCOUNTER — Encounter: Payer: Self-pay | Admitting: *Deleted

## 2020-12-18 DIAGNOSIS — R0600 Dyspnea, unspecified: Secondary | ICD-10-CM

## 2020-12-18 NOTE — Progress Notes (Signed)
Daily Session Note  Patient Details  Name: Rachel Fry MRN: [id number] Date of Birth: [birth year] Referring Provider:   Flowsheet Row Pulmonary Rehab from 07/29/2020 in [facility]  Referring Provider Praveen      Encounter Date: 12/18/2020  Check In:  Session Check In - 12/18/20 1543      Check-In   Supervising physician immediately available to respond to emergencies See telemetry face sheet for immediately available ER MD    Location [facility]-Cardiac & Pulmonary Rehab    Staff Present Birdie Sons, MPA, RN;Joseph Lou Miner, [place] Exercise Physiologist    Virtual Visit No    Medication changes reported     No    Fall or balance concerns reported    No    Warm-up and Cool-down Performed on first and last piece of equipment    Resistance Training Performed Yes    VAD Patient? No    PAD/SET Patient? No      Pain Assessment   Currently in Pain? No/denies              Social History   Tobacco Use  Smoking Status Never Smoker  Smokeless Tobacco Never Used    Goals Met:  Independence with exercise equipment Exercise tolerated well No report of cardiac concerns or symptoms Strength training completed today  Goals Unmet:  Not Applicable  Comments:  Rachel Fry graduated today from  rehab with 36 sessions completed.  Details of the patient's exercise prescription and what She needs to do in order to continue the prescription and progress were discussed with patient.  Patient was given a copy of prescription and goals.  Patient verbalized understanding.  Rachel Fry was encouraged to continue exercising after graduation.  Dr. Emily Filbert is Medical Director for [place] and [facility].

## 2020-12-18 NOTE — Progress Notes (Signed)
Discharge Progress Report  Patient Details  Name: Rachel Fry MRN: [id number] Date of Birth: Sep 02, 1956 Referring Provider:   Flowsheet Row Pulmonary Rehab from 07/29/2020 in [facility]  Referring Provider [place]       Number of Visits: 66  Reason for Discharge:  Patient reached a stable level of exercise. Patient independent in their exercise. Patient has met program and personal goals.  Smoking History:  Social History   Tobacco Use  Smoking Status Never Smoker  Smokeless Tobacco Never Used    Diagnosis:  Dyspnea, unspecified type  ADL UCSD:  Pulmonary Assessment Scores    Row Name 07/29/20 1630 11/25/20 1616 11/28/20 1643     ADL UCSD   ADL Phase -- Exit Exit   SOB Score total 45 -- 51   Rest 1 -- 1   Walk 3 -- 4   Stairs 4 -- 5   Bath 0 -- 0   Dress 0 -- 0   Shop 2 -- 2     CAT Score   CAT Score 16 -- 15     mMRC Score   mMRC Score 2 2 --          Initial Exercise Prescription:  Initial Exercise Prescription - 07/29/20 1600      Date of Initial Exercise RX and Referring Provider   Date 07/29/20    Referring Provider Praveen      Treadmill   MPH 2.8    Grade 1    Minutes 15    METs 3.6      Recumbant Bike   Level 1    RPM 60    Minutes 15    METs 3.6      NuStep   Level 3    SPM 80    Minutes 15    METs 3.6      Prescription Details   Frequency (times per week) 3    Duration Progress to 30 minutes of continuous aerobic without signs/symptoms of physical distress      Intensity   THRR 40-80% of Max Heartrate 110-140    Ratings of Perceived Exertion 11-15    Perceived Dyspnea 0-4      Resistance Training   Training Prescription Yes    Weight 2 lb    Reps 10-15           Discharge Exercise Prescription (Final Exercise Prescription Changes):  Exercise Prescription Changes - 12/17/20 0800      Response to Exercise   Blood Pressure (Admit) 136/78    Blood Pressure (Exercise) 154/80    Blood  Pressure (Exit) 130/80    Heart Rate (Admit) 71 bpm    Heart Rate (Exercise) 98 bpm    Heart Rate (Exit) 74 bpm    Oxygen Saturation (Admit) 97 %    Oxygen Saturation (Exercise) 97 %    Oxygen Saturation (Exit) 96 %    Rating of Perceived Exertion (Exercise) 14    Perceived Dyspnea (Exercise) 3    Symptoms SOB    Duration Continue with 30 min of aerobic exercise without signs/symptoms of physical distress.    Intensity THRR unchanged      Progression   Progression Continue to progress workloads to maintain intensity without signs/symptoms of physical distress.    Average METs 2.98      Resistance Training   Training Prescription Yes    Weight 3 lb    Reps 10-15      Interval Training  Interval Training No      Treadmill   MPH 2.4    Grade 1    Minutes 15    METs 3.17      NuStep   Level 4    Minutes 15    METs 2.8      Home Exercise Plan   Plans to continue exercise at Home (comment)   walking, treadmill, daughter's home gym   Frequency Add 2 additional days to program exercise sessions.    Initial Home Exercises Provided 09/09/20           Functional Capacity:  6 Minute Walk    Row Name 07/29/20 1622 11/25/20 1553       6 Minute Walk   Phase Initial Discharge    Distance 1480 feet 1400 feet    Distance % Change -- -5 %    Distance Feet Change -- -80 ft    Walk Time 6 minutes 6 minutes    # of Rest Breaks 0 0    MPH 2.8 2.65    METS 3.6 3.18    RPE 13 14    Perceived Dyspnea  2 3    VO2 Peak 12.6 11.15    Symptoms No Yes (comment)    Comments -- SOB    Resting HR 81 bpm 76 bpm    Resting BP 134/78 138/86    Resting Oxygen Saturation  97 % 97 %    Exercise Oxygen Saturation  during 6 min walk 95 % 96 %    Max Ex. HR 105 bpm 99 bpm    Max Ex. BP 154/84 130/82    2 Minute Post BP 136/84 124/82         Interval HR   1 Minute HR 96 87    2 Minute HR 97 94    3 Minute HR 102 97    4 Minute HR 101 96    5 Minute HR 105 98    6 Minute HR 104 99     2 Minute Post HR 88 80    Interval Heart Rate? Yes Yes         Interval Oxygen   Interval Oxygen? Yes Yes    Baseline Oxygen Saturation % 97 % 97 %    1 Minute Oxygen Saturation % 96 % 96 %    1 Minute Liters of Oxygen 0 L 0 L    2 Minute Oxygen Saturation % 96 % 96 %    2 Minute Liters of Oxygen 0 L 0 L    3 Minute Oxygen Saturation % 95 % 96 %    3 Minute Liters of Oxygen 0 L 0 L    4 Minute Oxygen Saturation % 96 % 96 %    4 Minute Liters of Oxygen 0 L 0 L    5 Minute Oxygen Saturation % 97 % 96 %    5 Minute Liters of Oxygen 0 L 0 L    6 Minute Oxygen Saturation % 94 % 97 %    6 Minute Liters of Oxygen 0 L 0 L    2 Minute Post Oxygen Saturation % 96 % 97 %    2 Minute Post Liters of Oxygen 0 L 0 L           Psychological, QOL, Others - Outcomes: PHQ 2/9: Depression screen [facility] 2/9 11/28/2020 07/29/2020 02/08/2020 03/28/2015  Decreased Interest 0 0 0 0  Down, Depressed, Hopeless 0 0 0  0  PHQ - 2 Score 0 0 0 0  Altered sleeping 1 0 - -  Tired, decreased energy 3 3 - -  Change in appetite 0 0 - -  Feeling bad or failure about yourself  0 0 - -  Trouble concentrating 1 0 - -  Moving slowly or fidgety/restless 0 0 - -  Suicidal thoughts 0 0 - -  PHQ-9 Score 5 3 - -  Difficult doing work/chores Not difficult at all Not difficult at all - -  Some recent data might be hidden    Quality of Life:   Nutrition & Weight - Outcomes:  Pre Biometrics - 07/29/20 1620      Pre Biometrics   Height '5\' 5"'  (1.651 m)    Weight 180 lb 3.2 oz (81.7 kg)    BMI (Calculated) 29.99    Single Leg Stand 11.63 seconds           Post Biometrics - 11/25/20 1616       Post  Biometrics   Height '5\' 6"'  (1.676 m)    Weight 184 lb (83.5 kg)    BMI (Calculated) 29.71    Single Leg Stand 11.83 seconds           Nutrition:  Nutrition Therapy & Goals - 08/05/20 1313      Nutrition Therapy   Diet Heart healthy, low Na, Pulmonary MNT    Drug/Food Interactions Statins/Certain Fruits     Protein (specify units) 75g    Fiber 25 grams    Whole Grain Foods 3 servings    Saturated Fats 12 max. grams    Fruits and Vegetables 5 servings/day    Sodium 1.5 grams      Personal Nutrition Goals   Nutrition Goal LT: weight increasing would like to maintain, improve breathing 7/10 now - when bad - 1-2 x/week sometimes after meals, rebuild microbiome    Comments Vitamin D prescription 50000 IUs. LDL has decreased and is now below or at 70. Low histamine diet. If B: oatmeal with raisins and brown sugar (at home blueberries) L: not big on lunch - at cafeteria (fish or meat that is more plain and two vegetables that are more plain) - will retain fluid (salt). Will try to not get restaurant food. Since COVID - will buy chicken, 93% beef - make hamburgers (freezes) (2-3x/week), pork roast, fressh fruits and vegetables usually comes bad from instant cart (carrots, potatoes, zucchini).  Rotissere chicken, salad, cheese, orange (canned). Tries to eat low sodium. snacks: choccolate and bag of chips, not eating lunch much. D: by 6pm most nights. chicken - chicken pie (makes own sauce with cheese, mushrooms, onions, carrots, bisquikc mix for crust), chicken and brown rice (chicken broth and cream cheese), hamburgers, meatloaf. likes 20 minute cooked meals. Utz ripple chips (low Na). water during the day. Appetite is low, taste and smell is altered. Will see doctor next monday for taste. Not a specific food that tastes different- it changes. Discussed adding two hearty snacks in the middle of the day; protein balls, tuna, bean salad, cucumbers/marys crackers/gluten free pretzles and hummus or other dip (she doesn't like garlic). Discussed greek yogurt after she sees doctor to see if she likes it. Collie Siad is gluten free, and allergic to tree nuts, eggs, peas, celery, milk, most raw vegetables - some cooked vegetables. discussed seeds, protein balls, proats.      Intervention Plan   Intervention Prescribe, educate and  counsel regarding individualized specific dietary modifications  aiming towards targeted core components such as weight, hypertension, lipid management, diabetes, heart failure and other comorbidities.;Nutrition handout(s) given to patient.    Expected Outcomes Short Term Goal: Understand basic principles of dietary content, such as calories, fat, sodium, cholesterol and nutrients.;Short Term Goal: A plan has been developed with personal nutrition goals set during dietitian appointment.;Long Term Goal: Adherence to prescribed nutrition plan.           Nutrition Discharge:  Nutrition Assessments - 11/28/20 1644      MEDFICTS Scores   Post Score 24           Education Questionnaire Score:  Knowledge Questionnaire Score - 11/28/20 1644      Knowledge Questionnaire Score   Post Score 18/18           Goals reviewed with patient; copy given to patient.

## 2020-12-18 NOTE — Progress Notes (Signed)
Pulmonary Individual Treatment Plan  Patient Details  Name: Rachel Fry MRN: [id number] Date of Birth: 03-Aug-1956 Referring Provider:   Flowsheet Row Pulmonary Rehab from 07/29/2020 in [facility] Cardiac and Pulmonary Rehab  Referring Provider Praveen      Initial Encounter Date:  Flowsheet Row Pulmonary Rehab from 07/29/2020 in [facility] Cardiac and Pulmonary Rehab  Date 07/29/20      Visit Diagnosis: Dyspnea, unspecified type  Patient's Home Medications on Admission:  Current Outpatient Medications:  .  albuterol (VENTOLIN HFA) 108 (90 Base) MCG/ACT inhaler, Inhale 2 puffs into the lungs every 4 (four) hours as needed for wheezing or shortness of breath. , Disp: , Rfl:  .  atorvastatin (LIPITOR) 20 MG tablet, TAKE 1 TABLET (20 MG TOTAL) BY MOUTH DAILY. (Patient taking differently: Take 20 mg by mouth at bedtime. ), Disp: 90 tablet, Rfl: 3 .  b complex vitamins capsule, Take 1 capsule by mouth daily., Disp: , Rfl:  .  Bacillus Coagulans-Inulin (PROBIOTIC) 1-250 BILLION-MG CAPS, Probiotic  Take 1 cap po daily, Disp: , Rfl:  .  botulinum toxin Type A (BOTOX) 100 UNITS SOLR injection, Botox - Historical Medication  once every 12 weeks for migraines Active, Disp: , Rfl:  .  carbamazepine (TEGRETOL) 200 MG tablet, SMARTSIG:1 Tablet(s) By Mouth Every 12 Hours (Patient not taking: Reported on 11/28/2020), Disp: , Rfl:  .  cetirizine (ZYRTEC) 10 MG tablet, Take 10 mg by mouth every evening. , Disp: , Rfl:  .  cloNIDine (CATAPRES) 0.1 MG tablet, Take 1 tablet (0.1 mg total) by mouth 2 (two) times daily., Disp: 60 tablet, Rfl:  .  EPINEPHrine 0.3 mg/0.3 mL IJ SOAJ injection, Inject 0.3 mg into the muscle as needed for anaphylaxis. , Disp: , Rfl: 1 .  Erenumab-aooe (AIMOVIG) 70 MG/ML SOAJ, Inject 70 mg into the skin every 30 (thirty) days. , Disp: , Rfl:  .  ezetimibe (ZETIA) 10 MG tablet, Take 10 mg by mouth every evening. , Disp: , Rfl:  .  famotidine (PEPCID) 20 MG tablet, Take 20 mg by mouth 2 (two)  times daily., Disp: , Rfl:  .  fluticasone (FLOVENT HFA) 110 MCG/ACT inhaler, Inhale 2 puffs into the lungs 2 (two) times daily. , Disp: , Rfl:  .  frovatriptan (FROVA) 2.5 MG tablet, Take 2.5 mg by mouth 2 (two) times daily as needed for migraine. Max of 3 doses per 7 days, Disp: , Rfl:  .  furosemide (LASIX) 20 MG tablet, Take 40 mg by mouth daily as needed for edema.  (Patient not taking: Reported on 11/28/2020), Disp: , Rfl:  .  levothyroxine (SYNTHROID, LEVOTHROID) 50 MCG tablet, Take 50 mcg by mouth daily before breakfast. , Disp: , Rfl: 5 .  Menaquinone-7 (VITAMIN K2 PO), Take by mouth., Disp: , Rfl:  .  NALTREXONE HCL PO, Take 1.5 mg by mouth daily., Disp: , Rfl:  .  omalizumab (XOLAIR) 150 MG injection, Inject 300 mg into the skin every 28 (twenty-eight) days., Disp: 2 each, Rfl: 11 .  Polyethyl Glycol-Propyl Glycol (SYSTANE OP), Place 1-2 drops into both eyes 2 (two) times daily., Disp: , Rfl:  .  predniSONE (DELTASONE) 5 MG tablet, Take 5 mg by mouth daily., Disp: , Rfl:  .  Rimegepant Sulfate (NURTEC) 75 MG TBDP, Take 75 mg by mouth daily as needed (migraines). Max 15 doses per 30 days, Disp: , Rfl:  .  rizatriptan (MAXALT) 10 MG tablet, Take 10 mg by mouth 2 (two) times daily as needed for  migraine. Max doses of 3 doses per 7 days, Disp: , Rfl:  .  spironolactone (ALDACTONE) 25 MG tablet, Take 50 mg by mouth 2 (two) times daily., Disp: , Rfl:  .  torsemide (DEMADEX) 20 MG tablet, Take 40 mg by mouth daily as needed., Disp: , Rfl:  .  triamcinolone (NASACORT ALLERGY 24HR) 55 MCG/ACT AERO nasal inhaler, Place 2 sprays into the nose at bedtime., Disp: , Rfl:  .  VASCEPA 1 g capsule, Take 2 g by mouth 2 (two) times daily., Disp: , Rfl:  .  Vitamin D, Ergocalciferol, (DRISDOL) 1.25 MG (50000 UT) CAPS capsule, Take 50,000 Units by mouth every Sunday. , Disp: , Rfl: 3 .  zafirlukast (ACCOLATE) 10 MG tablet, Take 10 mg by mouth 2 (two) times daily. , Disp: , Rfl:   Current Facility-Administered  Medications:  .  omalizumab Arvid Right) injection 300 mg, 300 mg, Subcutaneous, Q28 days, Valentina Shaggy, MD, 300 mg at 12/03/20 1720  Past Medical History: Past Medical History:  Diagnosis Date  . Allergy   . Asthma   . History of kidney stones   . Hypothyroidism   . Migraines   . Numbness and tingling    left side of body, occasionally right side   . S/P Botox injection     Tobacco Use: Social History   Tobacco Use  Smoking Status Never Smoker  Smokeless Tobacco Never Used    Labs: Recent Review Flowsheet Data    Labs for ITP Cardiac and Pulmonary Rehab Latest Ref Rng & Units 03/30/2014 07/24/2014 05/03/2015 12/10/2016 09/26/2019   Cholestrol 100 - 199 mg/dL 214(A) 177 - 201(H) -   LDLCALC 0 - 99 mg/dL 140 115(H) - 123(H) -   HDL >39 mg/dL 59 46 - 66 59   Trlycerides 0 - 149 mg/dL 74 81 - 61 116   Hemoglobin A1c 4.8 - 5.6 % 5.9 - 6.0(H) 5.4 -       Pulmonary Assessment Scores:  Pulmonary Assessment Scores    Row Name 07/29/20 1630 11/25/20 1616 11/28/20 1643     ADL UCSD   ADL Phase -- Exit Exit   SOB Score total 45 -- 51   Rest 1 -- 1   Walk 3 -- 4   Stairs 4 -- 5   Bath 0 -- 0   Dress 0 -- 0   Shop 2 -- 2     CAT Score   CAT Score 16 -- 15     mMRC Score   mMRC Score 2 2 --          UCSD: Self-administered rating of dyspnea associated with activities of daily living (ADLs) 6-point scale (0 = "not at all" to 5 = "maximal or unable to do because of breathlessness")  Scoring Scores range from 0 to 120.  Minimally important difference is 5 units  CAT: CAT can identify the health impairment of COPD patients and is better correlated with disease progression.  CAT has a scoring range of zero to 40. The CAT score is classified into four groups of low (less than 10), medium (10 - 20), high (21-30) and very high (31-40) based on the impact level of disease on health status. A CAT score over 10 suggests significant symptoms.  A worsening CAT score could be  explained by an exacerbation, poor medication adherence, poor inhaler technique, or progression of COPD or comorbid conditions.  CAT MCID is 2 points  mMRC: mMRC (Modified Medical Research Council) Dyspnea Scale is  used to assess the degree of baseline functional disability in patients of respiratory disease due to dyspnea. No minimal important difference is established. A decrease in score of 1 point or greater is considered a positive change.   Pulmonary Function Assessment:  Pulmonary Function Assessment - 07/29/20 1138      Breath   Bilateral Breath Sounds Clear    Shortness of Breath Yes;Limiting activity           Exercise Target Goals: Exercise Program Goal: Individual exercise prescription set using results from initial 6 min walk test and THRR while considering  patient's activity barriers and safety.   Exercise Prescription Goal: Initial exercise prescription builds to 30-45 minutes a day of aerobic activity, 2-3 days per week.  Home exercise guidelines will be given to patient during program as part of exercise prescription that the participant will acknowledge.  Education: Aerobic Exercise: - Group verbal and visual presentation on the components of exercise prescription. Introduces F.I.T.T principle from ACSM for exercise prescriptions.  Reviews F.I.T.T. principles of aerobic exercise including progression. Written material given at graduation.   Education: Resistance Exercise: - Group verbal and visual presentation on the components of exercise prescription. Introduces F.I.T.T principle from ACSM for exercise prescriptions  Reviews F.I.T.T. principles of resistance exercise including progression. Written material given at graduation.    Education: Exercise & Equipment Safety: - Individual verbal instruction and demonstration of equipment use and safety with use of the equipment. Flowsheet Row Pulmonary Rehab from 09/04/2020 in [facility] Cardiac and Pulmonary Rehab  Date  07/29/20  Educator [facility]  Instruction Review Code 1- Verbalizes Understanding      Education: Exercise Physiology & General Exercise Guidelines: - Group verbal and written instruction with models to review the exercise physiology of the cardiovascular system and associated critical values. Provides general exercise guidelines with specific guidelines to those with heart or lung disease.    Education: Flexibility, Balance, Mind/Body Relaxation: - Group verbal and visual presentation with interactive activity on the components of exercise prescription. Introduces F.I.T.T principle from ACSM for exercise prescriptions. Reviews F.I.T.T. principles of flexibility and balance exercise training including progression. Also discusses the mind body connection.  Reviews various relaxation techniques to help reduce and manage stress (i.e. Deep breathing, progressive muscle relaxation, and visualization). Balance handout provided to take home. Written material given at graduation.   Activity Barriers & Risk Stratification:  Activity Barriers & Cardiac Risk Stratification - 07/29/20 1152      Activity Barriers & Cardiac Risk Stratification   Activity Barriers Shortness of Breath;Balance Concerns;Chest Pain/Angina;Other (comment);Fibromyalgia    Comments Chest pain from Covid.    Cardiac Risk Stratification Low           6 Minute Walk:  6 Minute Walk    Row Name 07/29/20 1622 11/25/20 1553       6 Minute Walk   Phase Initial Discharge    Distance 1480 feet 1400 feet    Distance % Change -- -5 %    Distance Feet Change -- -80 ft    Walk Time 6 minutes 6 minutes    # of Rest Breaks 0 0    MPH 2.8 2.65    METS 3.6 3.18    RPE 13 14    Perceived Dyspnea  2 3    VO2 Peak 12.6 11.15    Symptoms No Yes (comment)    Comments -- SOB    Resting HR 81 bpm 76 bpm    Resting BP 134/78 138/86  Resting Oxygen Saturation  97 % 97 %    Exercise Oxygen Saturation  during 6 min walk 95 % 96 %    Max  Ex. HR 105 bpm 99 bpm    Max Ex. BP 154/84 130/82    2 Minute Post BP 136/84 124/82         Interval HR   1 Minute HR 96 87    2 Minute HR 97 94    3 Minute HR 102 97    4 Minute HR 101 96    5 Minute HR 105 98    6 Minute HR 104 99    2 Minute Post HR 88 80    Interval Heart Rate? Yes Yes         Interval Oxygen   Interval Oxygen? Yes Yes    Baseline Oxygen Saturation % 97 % 97 %    1 Minute Oxygen Saturation % 96 % 96 %    1 Minute Liters of Oxygen 0 L 0 L    2 Minute Oxygen Saturation % 96 % 96 %    2 Minute Liters of Oxygen 0 L 0 L    3 Minute Oxygen Saturation % 95 % 96 %    3 Minute Liters of Oxygen 0 L 0 L    4 Minute Oxygen Saturation % 96 % 96 %    4 Minute Liters of Oxygen 0 L 0 L    5 Minute Oxygen Saturation % 97 % 96 %    5 Minute Liters of Oxygen 0 L 0 L    6 Minute Oxygen Saturation % 94 % 97 %    6 Minute Liters of Oxygen 0 L 0 L    2 Minute Post Oxygen Saturation % 96 % 97 %    2 Minute Post Liters of Oxygen 0 L 0 L          Oxygen Initial Assessment:  Oxygen Initial Assessment - 07/29/20 1138      Home Oxygen   Home Oxygen Device None    Sleep Oxygen Prescription None    Home Exercise Oxygen Prescription None    Home Resting Oxygen Prescription None      Initial 6 min Walk   Oxygen Used None      Program Oxygen Prescription   Program Oxygen Prescription None      Intervention   Short Term Goals To learn and understand importance of monitoring SPO2 with pulse oximeter and demonstrate accurate use of the pulse oximeter.;To learn and understand importance of maintaining oxygen saturations>88%;To learn and demonstrate proper pursed lip breathing techniques or other breathing techniques.;To learn and demonstrate proper use of respiratory medications    Long  Term Goals Verbalizes importance of monitoring SPO2 with pulse oximeter and return demonstration;Maintenance of O2 saturations>88%;Exhibits proper breathing techniques, such as pursed lip  breathing or other method taught during program session;Compliance with respiratory medication;Demonstrates proper use of MDI's           Oxygen Re-Evaluation:  Oxygen Re-Evaluation    Row Name 08/07/20 1620 08/22/20 1424 09/09/20 1623 10/10/20 1542 11/14/20 1342     Program Oxygen Prescription   Program Oxygen Prescription _0      Home Oxygen   Home Oxygen Device _1    Sleep Oxygen Prescription _2    Home Exercise Oxygen Prescription _3    Home Resting Oxygen Prescription None None None  None None   Compliance with Home Oxygen Use -- -- -- No No     Goals/Expected Outcomes   Short Term Goals To learn and understand importance of monitoring SPO2 with pulse oximeter and demonstrate accurate use of the pulse oximeter.;To learn and understand importance of maintaining oxygen saturations>88%;To learn and demonstrate proper pursed lip breathing techniques or other breathing techniques.;To learn and demonstrate proper use of respiratory medications To learn and understand importance of maintaining oxygen saturations>88%;To learn and understand importance of monitoring SPO2 with pulse oximeter and demonstrate accurate use of the pulse oximeter.;To learn and demonstrate proper use of respiratory medications To learn and understand importance of maintaining oxygen saturations>88%;To learn and understand importance of monitoring SPO2 with pulse oximeter and demonstrate accurate use of the pulse oximeter.;To learn and demonstrate proper use of respiratory medications;To learn and demonstrate proper pursed lip breathing techniques or other breathing techniques. To learn and understand importance of monitoring SPO2 with pulse oximeter and demonstrate accurate use of the pulse oximeter.;To learn and understand importance of maintaining oxygen saturations>88% To learn and understand importance of monitoring SPO2 with pulse  oximeter and demonstrate accurate use of the pulse oximeter.;To learn and understand importance of maintaining oxygen saturations>88%   Long  Term Goals Verbalizes importance of monitoring SPO2 with pulse oximeter and return demonstration;Maintenance of O2 saturations>88%;Exhibits proper breathing techniques, such as pursed lip breathing or other method taught during program session;Compliance with respiratory medication;Demonstrates proper use of MDI's Maintenance of O2 saturations>88%;Verbalizes importance of monitoring SPO2 with pulse oximeter and return demonstration;Demonstrates proper use of MDI's Maintenance of O2 saturations>88%;Verbalizes importance of monitoring SPO2 with pulse oximeter and return demonstration;Demonstrates proper use of MDI's;Exhibits proper breathing techniques, such as pursed lip breathing or other method taught during program session Maintenance of O2 saturations>88%;Verbalizes importance of monitoring SPO2 with pulse oximeter and return demonstration Maintenance of O2 saturations>88%;Verbalizes importance of monitoring SPO2 with pulse oximeter and return demonstration   Comments Reviewed PLB technique with pt.  Talked about how it works and it's importance in maintaining their exercise saturations. Collie Siad states that she has been practicing PLB and has been using it. She uses her pulse oximeter at home and knows how to use it. She knows to keep her oxygen above 88 percent. She takes Flovent, Acolate, Zyrtec and Ventolin HFA as needed. She has no questions about her medications. Collie Siad is doing well with her PLB.  She finds that it is helpful for maintaining breath control and is starting to do it more without thinking about it.  She is doing well with her sats and will start to watch her sats with exercise at home. Collie Siad does not have equipment at home to exercise with. She states that she will try to use some equipment at work on her break while monitoring her SpO2. Informed her that staff  can giver her guidance of when she can use machines and also encourage her. Collie Siad is still struggling with her breathing.  It does not feel like it is improving.  She has a pulmonary follow up that is supposed to happen in March.  She has noted that her breathing has even come on at rest.  She sees her primary on Monday and we talked about a note to her pulmonologist about her breathing.   Goals/Expected Outcomes Short: Become more profiecient at using PLB.   Long: Become independent at using PLB Short: continue compliance with her medications and program regimine. Long: maintain exercise in [facility] and use breathing techniques. Short:  Continue to work on PLB montior sats with exercise at home Long; Continue to exericse to improve breathing. Short: Start monitoring SpO2 while exercising. Long: maintain exercise routine while monitoring Spo2 independently --   Row Name 11/28/20 1432             Program Oxygen Prescription   Program Oxygen Prescription None               Home Oxygen   Home Oxygen Device None       Sleep Oxygen Prescription None       Home Exercise Oxygen Prescription None       Home Resting Oxygen Prescription None               Goals/Expected Outcomes   Short Term Goals Other       Long  Term Goals Other       Comments Collie Siad has not needed to use her inhalers but feels like her shortness breath has not improved overall. She practices PLB daily and does deep breathing. She feel slike she has been able to work to her ablilty but sometimes her schedule does not allow her to be consistant with exercise.       Goals/Expected Outcomes Short: practive breathing techniques at home. Long: maintain breathing techniques and home medications independently              Oxygen Discharge (Final Oxygen Re-Evaluation):  Oxygen Re-Evaluation - 11/28/20 1432      Program Oxygen Prescription   Program Oxygen Prescription None      Home Oxygen   Home Oxygen Device None    Sleep Oxygen  Prescription None    Home Exercise Oxygen Prescription None    Home Resting Oxygen Prescription None      Goals/Expected Outcomes   Short Term Goals Other    Long  Term Goals Other    Comments Collie Siad has not needed to use her inhalers but feels like her shortness breath has not improved overall. She practices PLB daily and does deep breathing. She feel slike she has been able to work to her ablilty but sometimes her schedule does not allow her to be consistant with exercise.    Goals/Expected Outcomes Short: practive breathing techniques at home. Long: maintain breathing techniques and home medications independently           Initial Exercise Prescription:  Initial Exercise Prescription - 07/29/20 1600      Date of Initial Exercise RX and Referring Provider   Date 07/29/20    Referring Provider Praveen      Treadmill   MPH 2.8    Grade 1    Minutes 15    METs 3.6      Recumbant Bike   Level 1    RPM 60    Minutes 15    METs 3.6      NuStep   Level 3    SPM 80    Minutes 15    METs 3.6      Prescription Details   Frequency (times per week) 3    Duration Progress to 30 minutes of continuous aerobic without signs/symptoms of physical distress      Intensity   THRR 40-80% of Max Heartrate 110-140    Ratings of Perceived Exertion 11-15    Perceived Dyspnea 0-4      Resistance Training   Training Prescription Yes    Weight 2 lb    Reps 10-15  Perform Capillary Blood Glucose checks as needed.  Exercise Prescription Changes:  Exercise Prescription Changes    Row Name 07/29/20 1600 08/12/20 1000 08/27/20 1000 09/09/20 1200 09/24/20 1500     Response to Exercise   Blood Pressure (Admit) 134/78 132/82 130/58 142/70 154/92   Blood Pressure (Exercise) 154/84 162/80 146/64 142/82 154/78   Blood Pressure (Exit) 136/84 130/74 122/60 124/80 120/78   Heart Rate (Admit) 81 bpm 84 bpm 78 bpm 84 bpm 79 bpm   Heart Rate (Exercise) 105 bpm 101 bpm 109 bpm 107 bpm  104 bpm   Heart Rate (Exit) 88 bpm 84 bpm 87 bpm 88 bpm 78 bpm   Oxygen Saturation (Admit) 97 % 98 % 95 % 96 % 96 %   Oxygen Saturation (Exercise) 94 % 95 % 97 % 94 % 97 %   Oxygen Saturation (Exit) 96 % 94 % 98 % 95 % 96 %   Rating of Perceived Exertion (Exercise) _0 Perceived Dyspnea (Exercise) _1 Symptoms -- -- none none none   Comments -- first day exercise -- -- --   Duration -- -- Continue with 30 min of aerobic exercise without signs/symptoms of physical distress. Continue with 30 min of aerobic exercise without signs/symptoms of physical distress. Continue with 30 min of aerobic exercise without signs/symptoms of physical distress.   Intensity -- -- THRR unchanged THRR unchanged THRR unchanged     Progression   Progression -- -- Continue to progress workloads to maintain intensity without signs/symptoms of physical distress. Continue to progress workloads to maintain intensity without signs/symptoms of physical distress. Continue to progress workloads to maintain intensity without signs/symptoms of physical distress.   Average METs -- -- 3.5 2.73 3.2     Resistance Training   Training Prescription -- -- Yes Yes Yes   Weight -- -- 2 lb 3 lb 3 lb   Reps -- -- 10-15 10-15 10-15     Treadmill   MPH -- -- 2.8 2.4 2.5   Grade -- -- _2 Minutes -- -- _3 METs -- -- 3.53 3.17 3.53     Recumbant Bike   Level -- -- 1.4 -- --   Minutes -- -- 15 -- --   METs -- -- 2.7 -- --     NuStep   Level -- -- _4 SPM -- -- 80 80 80   Minutes -- -- _5 METs -- -- 1.6 2.1 2.9   Row Name 10/07/20 1100 10/21/20 1300 11/04/20 0900 11/20/20 1200 12/17/20 0800     Response to Exercise   Blood Pressure (Admit) 142/88 166/84 144/86 146/86 136/78   Blood Pressure (Exercise) 158/86 162/84 182/84 142/84 154/80   Blood Pressure (Exit) 110/70 142/66 122/68 134/82 130/80   Heart Rate (Admit) 84 bpm 87 bpm 80 bpm 86 bpm 71 bpm   Heart Rate (Exercise) 108 bpm  98 bpm 108 bpm 105 bpm 98 bpm   Heart Rate (Exit) 87 bpm 87 bpm 84 bpm 88 bpm 74 bpm   Oxygen Saturation (Admit) 98 % 96 % 96 % 94 % 97 %   Oxygen Saturation (Exercise) 94 % 92 % 98 % 94 % 97 %   Oxygen Saturation (Exit) 96 % 95 % 98 % 96 % 96 %   Rating of Perceived Exertion (Exercise) _6 Perceived  Dyspnea (Exercise) _0 Symptoms -- -- -- SOB SOB   Duration Continue with 30 min of aerobic exercise without signs/symptoms of physical distress. Continue with 30 min of aerobic exercise without signs/symptoms of physical distress. Continue with 30 min of aerobic exercise without signs/symptoms of physical distress. Continue with 30 min of aerobic exercise without signs/symptoms of physical distress. Continue with 30 min of aerobic exercise without signs/symptoms of physical distress.   Intensity _1      Progression   Progression Continue to progress workloads to maintain intensity without signs/symptoms of physical distress. Continue to progress workloads to maintain intensity without signs/symptoms of physical distress. Continue to progress workloads to maintain intensity without signs/symptoms of physical distress. Continue to progress workloads to maintain intensity without signs/symptoms of physical distress. Continue to progress workloads to maintain intensity without signs/symptoms of physical distress.   Average METs 2.8 2.4 2.8 2.65 2.98     Resistance Training   Training Prescription _2    Weight 3 lb 3 lb 3 lb 3 lb 3 lb   Reps 10-15 10-15 10-15 10-15 10-15     Interval Training   Interval Training -- -- -- No No     Treadmill   MPH 2.6 -- 2.7 2.5 2.4   Grade 1 -- _3 Minutes 15 -- _4 METs 3.35 -- 3.53 3.26 3.17     Recumbant Bike   Level -- 2 -- -- --   Minutes -- 15 -- -- --   METs -- 1.8 -- -- --     NuStep   Level 2 4 -- 4 4   SPM 80 80 -- -- --    Minutes 15 15 -- 15 15   METs 1.9 1.8 -- 2.7 2.8     T5 Nustep   Level -- -- 2 2 --   SPM -- -- 80 -- --   Minutes -- -- 15 15 --   METs -- -- 2 2 --     Home Exercise Plan   Plans to continue exercise at -- -- -- Home (comment)  walking, treadmill, daughter's home gym Home (comment)  walking, treadmill, daughter's home gym   Frequency -- -- -- Add 2 additional days to program exercise sessions. Add 2 additional days to program exercise sessions.   Initial Home Exercises Provided -- -- -- 09/09/20 09/09/20          Exercise Comments:   Exercise Goals and Review:  Exercise Goals    Row Name 07/29/20 1613 07/29/20 1629           Exercise Goals   Increase Physical Activity Yes Yes      Intervention Provide advice, education, support and counseling about physical activity/exercise needs.;Develop an individualized exercise prescription for aerobic and resistive training based on initial evaluation findings, risk stratification, comorbidities and participant's personal goals. Provide advice, education, support and counseling about physical activity/exercise needs.;Develop an individualized exercise prescription for aerobic and resistive training based on initial evaluation findings, risk stratification, comorbidities and participant's personal goals.      Expected Outcomes Short Term: Attend rehab on a regular basis to increase amount of physical activity.;Long Term: Add in home exercise to make exercise part of routine and to increase amount of physical activity.;Long Term: Exercising regularly at least 3-5 days a week. Short Term: Attend rehab on a regular basis  to increase amount of physical activity.;Long Term: Add in home exercise to make exercise part of routine and to increase amount of physical activity.;Long Term: Exercising regularly at least 3-5 days a week.      Increase Strength and Stamina Yes Yes      Intervention Provide advice, education, support and counseling about  physical activity/exercise needs.;Develop an individualized exercise prescription for aerobic and resistive training based on initial evaluation findings, risk stratification, comorbidities and participant's personal goals. Provide advice, education, support and counseling about physical activity/exercise needs.;Develop an individualized exercise prescription for aerobic and resistive training based on initial evaluation findings, risk stratification, comorbidities and participant's personal goals.      Expected Outcomes Short Term: Increase workloads from initial exercise prescription for resistance, speed, and METs.;Short Term: Perform resistance training exercises routinely during rehab and add in resistance training at home;Long Term: Improve cardiorespiratory fitness, muscular endurance and strength as measured by increased METs and functional capacity (6MWT) Short Term: Increase workloads from initial exercise prescription for resistance, speed, and METs.;Short Term: Perform resistance training exercises routinely during rehab and add in resistance training at home;Long Term: Improve cardiorespiratory fitness, muscular endurance and strength as measured by increased METs and functional capacity (6MWT)      Able to understand and use rate of perceived exertion (RPE) scale Yes Yes      Intervention Provide education and explanation on how to use RPE scale Provide education and explanation on how to use RPE scale      Expected Outcomes Short Term: Able to use RPE daily in rehab to express subjective intensity level;Long Term:  Able to use RPE to guide intensity level when exercising independently Short Term: Able to use RPE daily in rehab to express subjective intensity level;Long Term:  Able to use RPE to guide intensity level when exercising independently      Able to understand and use Dyspnea scale Yes Yes      Intervention Provide education and explanation on how to use Dyspnea scale Provide education  and explanation on how to use Dyspnea scale      Expected Outcomes Short Term: Able to use Dyspnea scale daily in rehab to express subjective sense of shortness of breath during exertion;Long Term: Able to use Dyspnea scale to guide intensity level when exercising independently Short Term: Able to use Dyspnea scale daily in rehab to express subjective sense of shortness of breath during exertion;Long Term: Able to use Dyspnea scale to guide intensity level when exercising independently      Knowledge and understanding of Target Heart Rate Range (THRR) Yes Yes      Intervention Provide education and explanation of THRR including how the numbers were predicted and where they are located for reference Provide education and explanation of THRR including how the numbers were predicted and where they are located for reference      Expected Outcomes Short Term: Able to state/look up THRR;Short Term: Able to use daily as guideline for intensity in rehab;Long Term: Able to use THRR to govern intensity when exercising independently Short Term: Able to state/look up THRR;Short Term: Able to use daily as guideline for intensity in rehab;Long Term: Able to use THRR to govern intensity when exercising independently      Able to check pulse independently Yes Yes      Intervention Provide education and demonstration on how to check pulse in carotid and radial arteries.;Review the importance of being able to check your own pulse for safety during independent  exercise Provide education and demonstration on how to check pulse in carotid and radial arteries.;Review the importance of being able to check your own pulse for safety during independent exercise      Expected Outcomes Short Term: Able to explain why pulse checking is important during independent exercise;Long Term: Able to check pulse independently and accurately Short Term: Able to explain why pulse checking is important during independent exercise;Long Term: Able to  check pulse independently and accurately      Understanding of Exercise Prescription Yes Yes      Intervention Provide education, explanation, and written materials on patient's individual exercise prescription Provide education, explanation, and written materials on patient's individual exercise prescription      Expected Outcomes Short Term: Able to explain program exercise prescription;Long Term: Able to explain home exercise prescription to exercise independently Short Term: Able to explain program exercise prescription;Long Term: Able to explain home exercise prescription to exercise independently             Exercise Goals Re-Evaluation :  Exercise Goals Re-Evaluation    Row Name 08/07/20 1622 08/27/20 1031 09/09/20 1250 09/24/20 1528 10/07/20 1154     Exercise Goal Re-Evaluation   Exercise Goals Review Increase Physical Activity;Able to understand and use rate of perceived exertion (RPE) scale;Knowledge and understanding of Target Heart Rate Range (THRR);Understanding of Exercise Prescription;Increase Strength and Stamina;Able to understand and use Dyspnea scale;Able to check pulse independently Increase Physical Activity;Able to understand and use rate of perceived exertion (RPE) scale;Knowledge and understanding of Target Heart Rate Range (THRR);Understanding of Exercise Prescription;Increase Strength and Stamina;Able to understand and use Dyspnea scale;Able to check pulse independently Increase Physical Activity;Increase Strength and Stamina Increase Physical Activity;Increase Strength and Stamina Increase Physical Activity;Increase Strength and Stamina   Comments Reviewed RPE and dyspnea scales, THR and program prescription with pt today.  Pt voiced understanding and was given a copy of goals to take home. Collie Siad is doing well in rehab and adjusting well to the machines. Her oxygen levels are maintained well during exercise. She is still experiencing some SOB and will take breaks as needed.  Will use PLB technique when she needs to. Will continue to monitor. Collie Siad has attended consistently this month.  Staff will review home exercise in the next few sessions. Collie Siad is progressing well and has increased speed on TM and level on T4 .  We will continue to monitor progress. Collie Siad is working towards goal speed of 2.8 on TM.  Her shortness of breath has been a 1 during exercise.  Staff discussed doing only concentric work during weights to reduce muscle soreness.   Expected Outcomes Short: Use RPE daily to regulate intensity. Long: Follow program prescription in THR. Short:  Increase speed on TM Long: Continue to progress overall MET level Short: continue to exercise consistently Long:  increase overall stamina Short:  review home exercise with EP Long: add exercise on days not at [place]:  try changing up strength work to see if it reduces soreness Long:  improve overall stamina   Row Name 10/21/20 1305 11/04/20 0914 11/14/20 1339 11/20/20 1201 11/28/20 1429     Exercise Goal Re-Evaluation   Exercise Goals Review Increase Physical Activity;Increase Strength and Stamina Increase Physical Activity;Increase Strength and Stamina Increase Physical Activity;Increase Strength and Stamina;Understanding of Exercise Prescription Increase Physical Activity;Increase Strength and Stamina;Understanding of Exercise Prescription Increase Physical Activity;Increase Strength and Stamina   Comments Collie Siad has attended twice per week consistently this month.  She is up to  2.6 mph on TM.  She still has muscle soreness after exercise beyond what is normal.  Staff will monitor progress. Collie Siad tried 2.7 on TM.  Oxygen stays in upper 990s during exercise.  We will continue to monitor progress. Collie Siad is doing well in rehab.  This past week her breathing has not feeling like it getting better and exercise was harder already.  She is not been able to exercise at home with weather.  She is planning to use her daughter's home gym after  graduation. She does not feel liek her stamina has really improved, she had a stretch of feeling better but then she is falling asleep at home early evenings. Collie Siad is doing well in rehab.  She is still struggling with her breathing but she is up to 2.5 mph on the treadmill.  We will continue to monitor her progress. Collie Siad is thinking about coming back to [facility] to do another round of rehab. Her daughter has an exercise room at her house that she could do. She feels safer working out at home or in [place].   Expected Outcomes Short:try to get 3 exercise sessions a week Long: improve overall stamina Short: get 3 sessions per week Long: increase MET level and stamina -- Short: Get back to routine attendance Long: Continue to improve stamina. Short: continue to exercise post [facility]. Long: maintain an exercise routine independently   Row Name 12/02/20 1625 12/17/20 0846           Exercise Goal Re-Evaluation   Exercise Goals Review Increase Physical Activity;Increase Strength and Stamina Increase Physical Activity;Increase Strength and Stamina;Understanding of Exercise Prescription      Comments Collie Siad will graduate in 4  sessions. Collie Siad did not improve her post 6MWT as her breathing is not getting better.  However, she is also not doing her home exercise.  She is set to graduate on her next visit.  She would like to re-enroll in rehab if given permission from her doctor.  She will need to establish a better routine for exercise to reap the benefits.      Expected Outcomes Short: complete [facility] program Long:  maintain exercise on her own Graduate and exercise independently             Discharge Exercise Prescription (Final Exercise Prescription Changes):  Exercise Prescription Changes - 12/17/20 0800      Response to Exercise   Blood Pressure (Admit) 136/78    Blood Pressure (Exercise) 154/80    Blood Pressure (Exit) 130/80    Heart Rate (Admit) 71 bpm    Heart Rate (Exercise) 98 bpm    Heart Rate (Exit)  74 bpm    Oxygen Saturation (Admit) 97 %    Oxygen Saturation (Exercise) 97 %    Oxygen Saturation (Exit) 96 %    Rating of Perceived Exertion (Exercise) 14    Perceived Dyspnea (Exercise) 3    Symptoms SOB    Duration Continue with 30 min of aerobic exercise without signs/symptoms of physical distress.    Intensity THRR unchanged      Progression   Progression Continue to progress workloads to maintain intensity without signs/symptoms of physical distress.    Average METs 2.98      Resistance Training   Training Prescription Yes    Weight 3 lb    Reps 10-15      Interval Training   Interval Training No      Treadmill   MPH 2.4    Grade 1  Minutes 15    METs 3.17      NuStep   Level 4    Minutes 15    METs 2.8      Home Exercise Plan   Plans to continue exercise at Home (comment)   walking, treadmill, daughter's home gym   Frequency Add 2 additional days to program exercise sessions.    Initial Home Exercises Provided 09/09/20           Nutrition:  Target Goals: Understanding of nutrition guidelines, daily intake of sodium <1542m, cholesterol <2012m calories 30% from fat and 7% or less from saturated fats, daily to have 5 or more servings of fruits and vegetables.  Education: All About Nutrition: -Group instruction provided by verbal, written material, interactive activities, discussions, models, and posters to present general guidelines for heart healthy nutrition including fat, fiber, MyPlate, the role of sodium in heart healthy nutrition, utilization of the nutrition label, and utilization of this knowledge for meal planning. Follow up email sent as well. Written material given at graduation. [facility]  Date 09/04/20  [facility] Code 1- Verbalizes Understanding      Biometrics:  Pre Biometrics - 07/29/20 1620      Pre Biometrics   Height _0  (1.651 m)     Weight 180 lb 3.2 oz (81.7 kg)    BMI (Calculated) 29.99    Single Leg Stand 11.63 seconds           Post Biometrics - 11/25/20 1616       Post  Biometrics   Height _1  (1.676 m)    Weight 184 lb (83.5 kg)    BMI (Calculated) 29.71    Single Leg Stand 11.83 seconds           Nutrition Therapy Plan and Nutrition Goals:  Nutrition Therapy & Goals - 08/05/20 1313      Nutrition Therapy   Diet Heart healthy, low Na, Pulmonary MNT    Drug/Food Interactions Statins/Certain Fruits    Protein (specify units) 75g    Fiber 25 grams    Whole Grain Foods 3 servings    Saturated Fats 12 max. grams    Fruits and Vegetables 5 servings/day    Sodium 1.5 grams      Personal Nutrition Goals   Nutrition Goal LT: weight increasing would like to maintain, improve breathing 7/10 now - when bad - 1-2 x/week sometimes after meals, rebuild microbiome    Comments Vitamin D prescription 50000 IUs. LDL has decreased and is now below or at 70. Low histamine diet. If B: oatmeal with raisins and brown sugar (at home blueberries) L: not big on lunch - at cafeteria (fish or meat that is more plain and two vegetables that are more plain) - will retain fluid (salt). Will try to not get restaurant food. Since COVID - will buy chicken, 93% beef - make hamburgers (freezes) (2-3x/week), pork roast, fressh fruits and vegetables usually comes bad from instant cart (carrots, potatoes, zucchini).  [place] chicken, salad, cheese, orange (canned). Tries to eat low sodium. snacks: choccolate and bag of chips, not eating lunch much. D: by 6pm most nights. chicken - chicken pie (makes own sauce with cheese, mushrooms, onions, carrots, bisquikc mix for crust), chicken and brown rice (chicken broth and cream cheese), hamburgers, meatloaf. likes 20 minute cooked meals. [place] ripple chips (low Na). water during the day. Appetite is low, taste and  smell is altered. Will see doctor next monday for taste. Not a specific food that  tastes different- it changes. Discussed adding two hearty snacks in the middle of the day; protein balls, tuna, bean salad, cucumbers/marys crackers/gluten free pretzles and hummus or other dip (she doesn't like garlic). Discussed greek yogurt after she sees doctor to see if she likes it. Collie Siad is gluten free, and allergic to tree nuts, eggs, peas, celery, milk, most raw vegetables - some cooked vegetables. discussed seeds, protein balls, proats.      Intervention Plan   Intervention Prescribe, educate and counsel regarding individualized specific dietary modifications aiming towards targeted core components such as weight, hypertension, lipid management, diabetes, heart failure and other comorbidities.;Nutrition handout(s) given to patient.    Expected Outcomes Short Term Goal: Understand basic principles of dietary content, such as calories, fat, sodium, cholesterol and nutrients.;Short Term Goal: A plan has been developed with personal nutrition goals set during dietitian appointment.;Long Term Goal: Adherence to prescribed nutrition plan.           Nutrition Assessments:  Nutrition Assessments - 11/28/20 1644      MEDFICTS Scores   Post Score 24          MEDIFICTS Score Key:  ?70 Need to make dietary changes   40-70 Heart Healthy Diet  ? 40 Therapeutic Level Cholesterol Diet   Picture Your Plate Scores:  <85 Unhealthy dietary pattern with much room for improvement.  41-50 Dietary pattern unlikely to meet recommendations for good health and room for improvement.  51-60 More healthful dietary pattern, with some room for improvement.   >60 Healthy dietary pattern, although there may be some specific behaviors that could be improved.   Nutrition Goals Re-Evaluation:  Nutrition Goals Re-Evaluation    [place] Name 09/09/20 1620 10/10/20 1549 11/14/20 1351 11/28/20 1440       Goals   Current Weight -- 181 lb (82.1 kg) -- 180 lb (81.6 kg)    Nutrition Goal LT: weight increasing  would like to maintain, improve breathing 7/10 now - when bad - 1-2 x/week sometimes after meals, rebuild microbiome Lose Weight. Add snack and small meal breakfast lose weight. Lacinda Axon more rather than eating out.    Comment She has met with dietitian is aware of changes to to make.  She is trying to balance her fruits and vegetables with what she is able to eat and what are low in histimaine responses.  She wants to get back to the store again but trying to find the best time to go.  She really wants to get back [employer] which is local and can help. Collie Siad has been watching her sodium intake. She is adding fruits to her salads at home. Her steroids make it hard for her to lose weight. She is so busy at work that sometimes she does not eat enough. Collie Siad states that her diet is sporadic and she knows she needs to eat more. Collie Siad has been doing better about getting breakfast.  She is limited in what she can get from cafeteria and her taste buds are still recovering.  Her taste buds are still coming and going.  She continues to try to make time to eat at work. She has been eating out sometimes but would like to get back cooking.Collie Siad gets tired by the end of the day and does have the help at home to cook meals daily.    Expected Outcome Short: Continue to add in fruits and vegetables  Long: Continue to make changes to help. Short: add a snack or a small meal for breakfast. Long: maintain a routine diet. Short: Continue to get breakfast each day  Long: Continue to work on diet. Short: cook more at home. Long: lose more weight with cooking at home.           Nutrition Goals Discharge (Final Nutrition Goals Re-Evaluation):  Nutrition Goals Re-Evaluation - 11/28/20 1440      Goals   Current Weight 180 lb (81.6 kg)    Nutrition Goal lose weight. Lacinda Axon more rather than eating out.    Comment She has been eating out sometimes but would like to get back cooking.Collie Siad gets tired by the end of the day and does have the  help at home to cook meals daily.    Expected Outcome Short: cook more at home. Long: lose more weight with cooking at home.           Psychosocial: Target Goals: Acknowledge presence or absence of significant depression and/or stress, maximize coping skills, provide positive support system. Participant is able to verbalize types and ability to use techniques and skills needed for reducing stress and depression.   Education: Stress, Anxiety, and Depression - Group verbal and visual presentation to define topics covered.  Reviews how body is impacted by stress, anxiety, and depression.  Also discusses healthy ways to reduce stress and to treat/manage anxiety and depression.  Written material given at graduation.   Education: Sleep Hygiene -Provides group verbal and written instruction about how sleep can affect your health.  Define sleep hygiene, discuss sleep cycles and impact of sleep habits. Review good sleep hygiene tips.    Initial Review & Psychosocial Screening:  Initial Psych Review & Screening - 07/29/20 1141      Initial Review   Current issues with Current Stress Concerns    Source of Stress Concerns Unable to perform yard/household activities    Comments Since COVID has been ongoing she deals with the daily stress of that. She has had Covid and has been dealing with some side effects. She has been more short of breath recently and needs some conditioning to help with her shortness of breath. She feels optimisitc about her health and wants to get better with exercise.      Family Dynamics   Good Support System? Yes    Comments She can look to her husband, dog, three daughters and co-workers for support.      Barriers   Psychosocial barriers to participate in program The patient should benefit from training in stress management and relaxation.      Screening Interventions   Interventions To provide support and resources with identified psychosocial needs;Provide feedback  about the scores to participant;Encouraged to exercise    Expected Outcomes Short Term goal: Utilizing psychosocial counselor, staff and physician to assist with identification of specific Stressors or current issues interfering with healing process. Setting desired goal for each stressor or current issue identified.;Long Term Goal: Stressors or current issues are controlled or eliminated.;Short Term goal: Identification and review with participant of any Quality of Life or Depression concerns found by scoring the questionnaire.;Long Term goal: The participant improves quality of Life and PHQ9 Scores as seen by post scores and/or verbalization of changes           Quality of Life Scores:  Scores of 19 and below usually indicate a poorer quality of life in these areas.  A difference of  2-3 points  is a clinically meaningful difference.  A difference of 2-3 points in the total score of the Quality of Life Index has been associated with significant improvement in overall quality of life, self-image, physical symptoms, and general health in studies assessing change in quality of life.  PHQ-9: Recent Review Flowsheet Data    Depression screen [facility] 2/9 11/28/2020 07/29/2020 02/08/2020 03/28/2015   Decreased Interest 0 0 0 0   Down, Depressed, Hopeless 0 0 0 0   PHQ - 2 Score 0 0 0 0   Altered sleeping 1 0 - -   Tired, decreased energy 3 3  - -   Change in appetite 0 0 - -   Feeling bad or failure about yourself  0 0 - -   Trouble concentrating 1 0 - -   Moving slowly or fidgety/restless 0 0 - -   Suicidal thoughts 0 0 - -   PHQ-9 Score 5 3 - -   Difficult doing work/chores Not difficult at all Not difficult at all - -     Interpretation of Total Score  Total Score Depression Severity:  1-4 = Minimal depression, 5-9 = Mild depression, 10-14 = Moderate depression, 15-19 = Moderately severe depression, 20-27 = Severe depression   Psychosocial Evaluation and Intervention:  Psychosocial Evaluation -  07/29/20 1147      Psychosocial Evaluation & Interventions   Interventions Encouraged to exercise with the program and follow exercise prescription;Stress management education;Relaxation education    Comments Since COVID has been ongoing she deals with the daily stress of that. She has had Covid and has been dealing with some side effects. She has been more short of breath recently and needs some conditioning to help with her shortness of breath. She feels optimisitc about her health and wants to get better with exercise.    Expected Outcomes Short: Attend [facility] to improve move. Long: maintain a positive outllok on health and graduate [facility].    Continue Psychosocial Services  Follow up required by staff           Psychosocial Re-Evaluation:  Psychosocial Re-Evaluation    [place] Name 08/22/20 1437 09/09/20 1614 10/10/20 1557 11/14/20 1348 11/28/20 1442     Psychosocial Re-Evaluation   Current issues with _0    Comments She gets stressed with not being able to breath at times and gets short of breath. Collie Siad has a positive outlook on her health and wants to get healthier. Collie Siad is doing well mentally.  We made it through [place] which is a weight off her shoulders.  She still gets frustrated with not being able to do what she wants. Since COVID, she is sleeping better than ever.   She still gets up some at night, but is able to get back to sleep easily.  She continues to work on her breathing.  Her biggest stressor is still not being able to see her grandkids and hug them.  It has been over 3 years since she saw them. Collie Siad is stressed about Christmas since she has not seen her daughter in three years. She does not want to talk about it becuase she may end up crying. She has taken off in June to try to make it work. Collie Siad is still working on her stress.  She misses her kids and  grandkids.  She is trying to find time and things to do with them to be more  engaging.  She also has a lot of stress at work recently with the weather and scheduling with staff.  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  She continues to sleep well but still having the vivid dreams as well.  She is sleeping better now and falls asleep early in the evening. Collie Siad has the stress of not seeing her family and grandkids. They live far away but she is happy that one of her daughters lives two miles from her. Her mood is stable and has no other issues with mental health at this time.   Expected Outcomes Short: continue to exercise to improve shortness of breath and mood. Long: maintain exercise post [facility] to keep stress at a minimum. Short: Continue to work on breathing and stamina  Long; Continue to talk to girls over the phone. Short: continue to exercise to keep stress at a minimum. Long: maintain exercise independently to keep stress minimal. Short: Exercise for stress relief. Long: Continue to aim for positive thoughts --   Interventions Encouraged to attend Pulmonary Rehabilitation for the exercise Encouraged to attend Pulmonary Rehabilitation for the exercise Encouraged to attend Pulmonary Rehabilitation for the exercise Encouraged to attend Pulmonary Rehabilitation for the exercise Encouraged to attend Pulmonary Rehabilitation for the exercise   Continue Psychosocial Services  Follow up required by staff Follow up required by staff Follow up required by staff Follow up required by staff No Follow up required     Initial Review   Source of Stress Concerns -- Unable to perform yard/household activities -- -- --          Psychosocial Discharge (Final Psychosocial Re-Evaluation):  Psychosocial Re-Evaluation - 11/28/20 1442      Psychosocial Re-Evaluation   Current issues with Current Stress Concerns    Comments Collie Siad has the stress of not seeing her family and  grandkids. They live far away but she is happy that one of her daughters lives two miles from her. Her mood is stable and has no other issues with mental health at this time.    Interventions Encouraged to attend Pulmonary Rehabilitation for the exercise    Continue Psychosocial Services  No Follow up required           Education: Education Goals: Education classes will be provided on a weekly basis, covering required topics. Participant will state understanding/return demonstration of topics presented.  Learning Barriers/Preferences:  Learning Barriers/Preferences - 07/29/20 1139      Learning Barriers/Preferences   Learning Barriers Sight    Learning Preferences None           General Pulmonary Education Topics:  Infection Prevention: - Provides verbal and written material to individual with discussion of infection control including proper hand washing and proper equipment cleaning during exercise session. Flowsheet Row Pulmonary Rehab from 09/04/2020 in [facility] Card[facility] and Pulmonary Rehab  Date 07/29/20  Educator [facility]  Instruction Review Code 1- Verbalizes Understanding      Falls Prevention: - Provides verbal and written material to individual with discussion of falls prevention and safety. Flowsheet Row Pulmonary Rehab from 09/04/2020 in [facility] Cardiac and Pulmonary Rehab  Date 07/29/20  Educator [facility]  Instruction Review Code 1- Verbalizes Understanding      Chronic Lung Disease Review: - Group verbal instruction with posters, models, PowerPoint presentations and videos,  to review new updates, new respiratory medications, new advancements in procedures and treatments. Providing information on websites and "800" numbers for continued self-education. Includes information  about supplement oxygen, available portable oxygen systems, continuous and intermittent flow rates, oxygen safety, concentrators, and Medicare reimbursement for oxygen. Explanation of Pulmonary Drugs, including  class, frequency, complications, importance of spacers, rinsing mouth after steroid MDI's, and proper cleaning methods for nebulizers. Review of basic lung anatomy and physiology related to function, structure, and complications of lung disease. Review of risk factors. Discussion about methods for diagnosing sleep apnea and types of masks and machines for OSA. Includes a review of the use of types of environmental controls: home humidity, furnaces, filters, dust mite/pet prevention, HEPA vacuums. Discussion about weather changes, air quality and the benefits of nasal washing. Instruction on Warning signs, infection symptoms, calling MD promptly, preventive modes, and value of vaccinations. Review of effective airway clearance, coughing and/or vibration techniques. Emphasizing that all should Create an Action Plan. Written material given at graduation.   AED/CPR: - Group verbal and written instruction with the use of models to demonstrate the basic use of the AED with the basic ABC's of resuscitation.    Anatomy and Cardiac Procedures: - Group verbal and visual presentation and models provide information about basic cardiac anatomy and function. Reviews the testing methods done to diagnose heart disease and the outcomes of the test results. Describes the treatment choices: Medical Management, Angioplasty, or Coronary Bypass Surgery for treating various heart conditions including Myocardial Infarction, Angina, Valve Disease, and Cardiac Arrhythmias.  Written material given at graduation.   Medication Safety: - Group verbal and visual instruction to review commonly prescribed medications for heart and lung disease. Reviews the medication, class of the drug, and side effects. Includes the steps to properly store meds and maintain the prescription regimen.  Written material given at graduation.   Other: -Provides group and verbal instruction on various topics (see comments)   Knowledge Questionnaire  Score:  Knowledge Questionnaire Score - 11/28/20 1644      Knowledge Questionnaire Score   Post Score 18/18            Core Components/Risk Factors/Patient Goals at Admission:  Personal Goals and Risk Factors at Admission - 07/29/20 1634      Core Components/Risk Factors/Patient Goals on Admission    Weight Management Yes;Weight Loss    Intervention Weight Management: Develop a combined nutrition and exercise program designed to reach desired caloric intake, while maintaining appropriate intake of nutrient and fiber, sodium and fats, and appropriate energy expenditure required for the weight goal.;Weight Management: Provide education and appropriate resources to help participant work on and attain dietary goals.;Weight Management/Obesity: Establish reasonable short term and long term weight goals.    Admit Weight 180 lb 3.2 oz (81.7 kg)    Goal Weight: Short Term 175 lb (79.4 kg)    Goal Weight: Long Term 170 lb (77.1 kg)    Expected Outcomes Short Term: Continue to assess and modify interventions until short term weight is achieved;Long Term: Adherence to nutrition and physical activity/exercise program aimed toward attainment of established weight goal;Weight Maintenance: Understanding of the daily nutrition guidelines, which includes 25-35% calories from fat, 7% or less cal from saturated fats, less than 260m cholesterol, less than 1.5gm of sodium, & 5 or more servings of fruits and vegetables daily;Weight Loss: Understanding of general recommendations for a balanced deficit meal plan, which promotes 1-2 lb weight loss per week and includes a negative energy balance of [phone] kcal/d;Understanding recommendations for meals to include 15-35% energy as protein, 25-35% energy from fat, 35-60% energy from carbohydrates, less than 2098mof dietary cholesterol, 20-35  gm of total fiber daily;Understanding of distribution of calorie intake throughout the day with the consumption of 4-5 meals/snacks     Improve shortness of breath with ADL's Yes    Intervention Provide education, individualized exercise plan and daily activity instruction to help decrease symptoms of SOB with activities of daily living.    Expected Outcomes Short Term: Improve cardiorespiratory fitness to achieve a reduction of symptoms when performing ADLs;Long Term: Be able to perform more ADLs without symptoms or delay the onset of symptoms    Hypertension Yes    Intervention Provide education on lifestyle modifcations including regular physical activity/exercise, weight management, moderate sodium restriction and increased consumption of fresh fruit, vegetables, and low fat dairy, alcohol moderation, and smoking cessation.;Monitor prescription use compliance.    Expected Outcomes Short Term: Continued assessment and intervention until BP is < 140/35m HG in hypertensive participants. < 130/867mHG in hypertensive participants with diabetes, heart failure or chronic kidney disease.;Long Term: Maintenance of blood pressure at goal levels.    Lipids Yes    Intervention Provide education and support for participant on nutrition & aerobic/resistive exercise along with prescribed medications to achieve LDL <7058mHDL >83m83m  Expected Outcomes Short Term: Participant states understanding of desired cholesterol values and is compliant with medications prescribed. Participant is following exercise prescription and nutrition guidelines.;Long Term: Cholesterol controlled with medications as prescribed, with individualized exercise RX and with personalized nutrition plan. Value goals: LDL < 70mg3mL > 40 mg.           Education:Diabetes - Individual verbal and written instruction to review signs/symptoms of diabetes, desired ranges of glucose level fasting, after meals and with exercise. Acknowledge that pre and post exercise glucose checks will be done for 3 sessions at entry of program.   Know Your Numbers and Heart Failure: -  Group verbal and visual instruction to discuss disease risk factors for cardiac and pulmonary disease and treatment options.  Reviews associated critical values for Overweight/Obesity, Hypertension, Cholesterol, and Diabetes.  Discusses basics of heart failure: signs/symptoms and treatments.  Introduces Heart Failure Zone chart for action plan for heart failure.  Written material given at graduation.   Core Components/Risk Factors/Patient Goals Review:   Goals and Risk Factor Review    Row Name 08/22/20 1429 09/09/20 1617 10/10/20 1547 11/14/20 1345 11/28/20 1436     Core Components/Risk Factors/Patient Goals Review   Personal Goals Review Weight Management/Obesity;Improve shortness of breath with ADL's Weight Management/Obesity;Improve shortness of breath with ADL's;Hypertension Weight Management/Obesity;Improve shortness of breath with ADL's Weight Management/Obesity;Improve shortness of breath with ADL's;Hypertension Improve shortness of breath with ADL's;Hypertension;Weight Management/Obesity   Review If sue gets up at night and walks up the three steps to go to the bathroom she will get short of breath. She is going to try to sit up first take a few deep breaths then get up. Work on PLB while doing strenous activities. Sue iCollie Siadaintaining her weight.  She is usually up and down 3-4 lbs.  The predinisone is not helping and she is salt sensitive.  She has tried to start taking her Lasix a little more frequently to help.  Her breathing is starting to improve, but it is still there and come on strong.  Her blood pressures are doing better but are still high in doctor's office 140/92, but in class they are doing well. Sue iCollie Siadorking on losing weight and has been gaining. She is taking steriods which makes it hard for her to lose  weight.Spoke to patient about their shortness of breath and what they can do to improve. Patient has been informed of breathing techniques when starting the program. Patient is  informed to tell staff if they have had any med changes and that certain meds they are taking or not taking can be causing shortness of breath. Collie Siad has a primary care visit on Monday.  Her weight is relatively stable it goes up and down with fluid levels.   Her blood pressures are high prior to exercise, but good with recovery.  She is checking it fairly routinely.  She is doing well with her meds.  Her breathing has been getting worse and is now happening at rest too. She is going to talk to doctor about it and send note to pulmonologist. Collie Siad has not lost weight since the start of the program and is not sure why she is not losing weight. Sometimes she stil gets shorts of breath when going upstairs. Her blood pressure has been stable but has not got lower since the start of the program. She would still like to lose more weight to aid in her breathing.   Expected Outcomes Short: Work on PLB when doing ADL's. Long: maintain breathing techniques at home independently. Short: Continue to work on [employer] Long; Continue to improve breathing. Short: Attend [facility] regularly to improve shortness of breath with ADL's. Long: maintain independence with ADL's Short: Talk to doc about breathing Long: Continue to work on risk factors. Short: lose more weight by graduation day. Long: maintain weight loss independently          Core Components/Risk Factors/Patient Goals at Discharge (Final Review):   Goals and Risk Factor Review - 11/28/20 1436      Core Components/Risk Factors/Patient Goals Review   Personal Goals Review Improve shortness of breath with ADL's;Hypertension;Weight Management/Obesity    Review Collie Siad has not lost weight since the start of the program and is not sure why she is not losing weight. Sometimes she stil gets shorts of breath when going upstairs. Her blood pressure has been stable but has not got lower since the start of the program. She would still like to lose more weight to aid in  her breathing.    Expected Outcomes Short: lose more weight by graduation day. Long: maintain weight loss independently           ITP Comments:  ITP Comments    Row Name 07/29/20 1159 07/29/20 1634 07/31/20 0733 08/05/20 1525 08/07/20 1620   ITP Comments In person Visit completed. Patient informed on EP and RD appointment and 6 Minute walk test. Patient also informed of patient health questionnaires on My Chart. Patient Verbalizes understanding. Visit diagnosis can be found in [facility]/19/[facility]. Completed 6MWT and gym orientation. Initial ITP created and sent for review to Dr. Emily Filbert, Medical Director. 30 day review completed. ITP sent to Dr. Emily Filbert, Medical Director of Cardiac and Pulmonary Rehab. Continue with ITP unless changes are made by physician.  Patient is new to program. Completed Initial RD Evaluation First full day of exercise!  Patient was oriented to gym and equipment including functions, settings, policies, and procedures.  Patient's individual exercise prescription and treatment plan were reviewed.  All starting workloads were established based on the results of the 6 minute walk test done at initial orientation visit.  The plan for exercise progression was also introduced and progression will be customized based on patient's performance and goals.   Eva Name 08/28/20 [phone]  09/25/20 1002 10/23/20 0825 11/20/20 0725 12/18/20 0752   ITP Comments 30 day review completed. ITP sent to Dr. Emily Filbert, Medical Director of Cardiac and Pulmonary Rehab. Continue with ITP unless changes are made by physician. 30 Day review completed. Medical Director ITP review done, changes made as directed, and signed approval by Medical Director. 30 Day review completed. Medical Director ITP review done, changes made as directed, and signed approval by Medical Director. 30 day review completed. ITP sent to Dr. Emily Filbert, Medical Director of Cardiac and Pulmonary Rehab. Continue with ITP unless changes are  made by physician. 30 day review completed. ITP sent to Dr. Emily Filbert, Medical Director of Cardiac and Pulmonary Rehab. Continue with ITP unless changes are made by physician.   [place] Name 12/18/20 1548           ITP Comments Marilou graduated today from  rehab with 36 sessions completed.  Details of the patient's exercise prescription and what She needs to do in order to continue the prescription and progress were discussed with patient.  Patient was given a copy of prescription and goals.  Patient verbalized understanding.  Kimora was encouraged to continue exercising after graduation.              Comments: discharge ITP

## 2020-12-18 NOTE — Progress Notes (Signed)
Pulmonary Individual Treatment Plan  Patient Details  Name: Rachel Fry MRN: [id number] Date of Birth: 03-Aug-1956 Referring Provider:   Flowsheet Row Pulmonary Rehab from 07/29/2020 in [facility] Cardiac and Pulmonary Rehab  Referring Provider Praveen      Initial Encounter Date:  Flowsheet Row Pulmonary Rehab from 07/29/2020 in [facility] Cardiac and Pulmonary Rehab  Date 07/29/20      Visit Diagnosis: Dyspnea, unspecified type  Patient's Home Medications on Admission:  Current Outpatient Medications:  .  albuterol (VENTOLIN HFA) 108 (90 Base) MCG/ACT inhaler, Inhale 2 puffs into the lungs every 4 (four) hours as needed for wheezing or shortness of breath. , Disp: , Rfl:  .  atorvastatin (LIPITOR) 20 MG tablet, TAKE 1 TABLET (20 MG TOTAL) BY MOUTH DAILY. (Patient taking differently: Take 20 mg by mouth at bedtime. ), Disp: 90 tablet, Rfl: 3 .  b complex vitamins capsule, Take 1 capsule by mouth daily., Disp: , Rfl:  .  Bacillus Coagulans-Inulin (PROBIOTIC) 1-250 BILLION-MG CAPS, Probiotic  Take 1 cap po daily, Disp: , Rfl:  .  botulinum toxin Type A (BOTOX) 100 UNITS SOLR injection, Botox - Historical Medication  once every 12 weeks for migraines Active, Disp: , Rfl:  .  carbamazepine (TEGRETOL) 200 MG tablet, SMARTSIG:1 Tablet(s) By Mouth Every 12 Hours (Patient not taking: Reported on 11/28/2020), Disp: , Rfl:  .  cetirizine (ZYRTEC) 10 MG tablet, Take 10 mg by mouth every evening. , Disp: , Rfl:  .  cloNIDine (CATAPRES) 0.1 MG tablet, Take 1 tablet (0.1 mg total) by mouth 2 (two) times daily., Disp: 60 tablet, Rfl:  .  EPINEPHrine 0.3 mg/0.3 mL IJ SOAJ injection, Inject 0.3 mg into the muscle as needed for anaphylaxis. , Disp: , Rfl: 1 .  Erenumab-aooe (AIMOVIG) 70 MG/ML SOAJ, Inject 70 mg into the skin every 30 (thirty) days. , Disp: , Rfl:  .  ezetimibe (ZETIA) 10 MG tablet, Take 10 mg by mouth every evening. , Disp: , Rfl:  .  famotidine (PEPCID) 20 MG tablet, Take 20 mg by mouth 2 (two)  times daily., Disp: , Rfl:  .  fluticasone (FLOVENT HFA) 110 MCG/ACT inhaler, Inhale 2 puffs into the lungs 2 (two) times daily. , Disp: , Rfl:  .  frovatriptan (FROVA) 2.5 MG tablet, Take 2.5 mg by mouth 2 (two) times daily as needed for migraine. Max of 3 doses per 7 days, Disp: , Rfl:  .  furosemide (LASIX) 20 MG tablet, Take 40 mg by mouth daily as needed for edema.  (Patient not taking: Reported on 11/28/2020), Disp: , Rfl:  .  levothyroxine (SYNTHROID, LEVOTHROID) 50 MCG tablet, Take 50 mcg by mouth daily before breakfast. , Disp: , Rfl: 5 .  Menaquinone-7 (VITAMIN K2 PO), Take by mouth., Disp: , Rfl:  .  NALTREXONE HCL PO, Take 1.5 mg by mouth daily., Disp: , Rfl:  .  omalizumab (XOLAIR) 150 MG injection, Inject 300 mg into the skin every 28 (twenty-eight) days., Disp: 2 each, Rfl: 11 .  Polyethyl Glycol-Propyl Glycol (SYSTANE OP), Place 1-2 drops into both eyes 2 (two) times daily., Disp: , Rfl:  .  predniSONE (DELTASONE) 5 MG tablet, Take 5 mg by mouth daily., Disp: , Rfl:  .  Rimegepant Sulfate (NURTEC) 75 MG TBDP, Take 75 mg by mouth daily as needed (migraines). Max 15 doses per 30 days, Disp: , Rfl:  .  rizatriptan (MAXALT) 10 MG tablet, Take 10 mg by mouth 2 (two) times daily as needed for  migraine. Max doses of 3 doses per 7 days, Disp: , Rfl:  .  spironolactone (ALDACTONE) 25 MG tablet, Take 50 mg by mouth 2 (two) times daily., Disp: , Rfl:  .  torsemide (DEMADEX) 20 MG tablet, Take 40 mg by mouth daily as needed., Disp: , Rfl:  .  triamcinolone (NASACORT ALLERGY 24HR) 55 MCG/ACT AERO nasal inhaler, Place 2 sprays into the nose at bedtime., Disp: , Rfl:  .  VASCEPA 1 g capsule, Take 2 g by mouth 2 (two) times daily., Disp: , Rfl:  .  Vitamin D, Ergocalciferol, (DRISDOL) 1.25 MG (50000 UT) CAPS capsule, Take 50,000 Units by mouth every Sunday. , Disp: , Rfl: 3 .  zafirlukast (ACCOLATE) 10 MG tablet, Take 10 mg by mouth 2 (two) times daily. , Disp: , Rfl:   Current Facility-Administered  Medications:  .  omalizumab Arvid Right) injection 300 mg, 300 mg, Subcutaneous, Q28 days, Valentina Shaggy, MD, 300 mg at 12/03/20 1720  Past Medical History: Past Medical History:  Diagnosis Date  . Allergy   . Asthma   . History of kidney stones   . Hypothyroidism   . Migraines   . Numbness and tingling    left side of body, occasionally right side   . S/P Botox injection     Tobacco Use: Social History   Tobacco Use  Smoking Status Never Smoker  Smokeless Tobacco Never Used    Labs: Recent Review Flowsheet Data    Labs for ITP Cardiac and Pulmonary Rehab Latest Ref Rng & Units 03/30/2014 07/24/2014 05/03/2015 12/10/2016 09/26/2019   Cholestrol 100 - 199 mg/dL 214(A) 177 - 201(H) -   LDLCALC 0 - 99 mg/dL 140 115(H) - 123(H) -   HDL >39 mg/dL 59 46 - 66 59   Trlycerides 0 - 149 mg/dL 74 81 - 61 116   Hemoglobin A1c 4.8 - 5.6 % 5.9 - 6.0(H) 5.4 -       Pulmonary Assessment Scores:  Pulmonary Assessment Scores    Row Name 07/29/20 1630 11/25/20 1616 11/28/20 1643     ADL UCSD   ADL Phase - Exit Exit   SOB Score total 45 - 51   Rest 1 - 1   Walk 3 - 4   Stairs 4 - 5   Bath 0 - 0   Dress 0 - 0   Shop 2 - 2     CAT Score   CAT Score 16 - 15     mMRC Score   mMRC Score 2 2 -          UCSD: Self-administered rating of dyspnea associated with activities of daily living (ADLs) 6-point scale (0 = "not at all" to 5 = "maximal or unable to do because of breathlessness")  Scoring Scores range from 0 to 120.  Minimally important difference is 5 units  CAT: CAT can identify the health impairment of COPD patients and is better correlated with disease progression.  CAT has a scoring range of zero to 40. The CAT score is classified into four groups of low (less than 10), medium (10 - 20), high (21-30) and very high (31-40) based on the impact level of disease on health status. A CAT score over 10 suggests significant symptoms.  A worsening CAT score could be explained by  an exacerbation, poor medication adherence, poor inhaler technique, or progression of COPD or comorbid conditions.  CAT MCID is 2 points  mMRC: mMRC (Modified Medical Research Council) Dyspnea Scale is  used to assess the degree of baseline functional disability in patients of respiratory disease due to dyspnea. No minimal important difference is established. A decrease in score of 1 point or greater is considered a positive change.   Pulmonary Function Assessment:  Pulmonary Function Assessment - 07/29/20 1138      Breath   Bilateral Breath Sounds Clear    Shortness of Breath Yes;Limiting activity           Exercise Target Goals: Exercise Program Goal: Individual exercise prescription set using results from initial 6 min walk test and THRR while considering  patient's activity barriers and safety.   Exercise Prescription Goal: Initial exercise prescription builds to 30-45 minutes a day of aerobic activity, 2-3 days per week.  Home exercise guidelines will be given to patient during program as part of exercise prescription that the participant will acknowledge.  Education: Aerobic Exercise: - Group verbal and visual presentation on the components of exercise prescription. Introduces F.I.T.T principle from ACSM for exercise prescriptions.  Reviews F.I.T.T. principles of aerobic exercise including progression. Written material given at graduation.   Education: Resistance Exercise: - Group verbal and visual presentation on the components of exercise prescription. Introduces F.I.T.T principle from ACSM for exercise prescriptions  Reviews F.I.T.T. principles of resistance exercise including progression. Written material given at graduation.    Education: Exercise & Equipment Safety: - Individual verbal instruction and demonstration of equipment use and safety with use of the equipment. Flowsheet Row Pulmonary Rehab from 09/04/2020 in [facility] Cardiac and Pulmonary Rehab  Date 07/29/20   Educator [facility]  Instruction Review Code 1- Verbalizes Understanding      Education: Exercise Physiology & General Exercise Guidelines: - Group verbal and written instruction with models to review the exercise physiology of the cardiovascular system and associated critical values. Provides general exercise guidelines with specific guidelines to those with heart or lung disease.    Education: Flexibility, Balance, Mind/Body Relaxation: - Group verbal and visual presentation with interactive activity on the components of exercise prescription. Introduces F.I.T.T principle from ACSM for exercise prescriptions. Reviews F.I.T.T. principles of flexibility and balance exercise training including progression. Also discusses the mind body connection.  Reviews various relaxation techniques to help reduce and manage stress (i.e. Deep breathing, progressive muscle relaxation, and visualization). Balance handout provided to take home. Written material given at graduation.   Activity Barriers & Risk Stratification:  Activity Barriers & Cardiac Risk Stratification - 07/29/20 1152      Activity Barriers & Cardiac Risk Stratification   Activity Barriers Shortness of Breath;Balance Concerns;Chest Pain/Angina;Other (comment);Fibromyalgia    Comments Chest pain from Covid.    Cardiac Risk Stratification Low           6 Minute Walk:  6 Minute Walk    Row Name 07/29/20 1622 11/25/20 1553       6 Minute Walk   Phase Initial Discharge    Distance 1480 feet 1400 feet    Distance % Change - -5 %    Distance Feet Change - -80 ft    Walk Time 6 minutes 6 minutes    # of Rest Breaks 0 0    MPH 2.8 2.65    METS 3.6 3.18    RPE 13 14    Perceived Dyspnea  2 3    VO2 Peak 12.6 11.15    Symptoms No Yes (comment)    Comments - SOB    Resting HR 81 bpm 76 bpm    Resting BP 134/78 138/86  Resting Oxygen Saturation  97 % 97 %    Exercise Oxygen Saturation  during 6 min walk 95 % 96 %    Max Ex. HR 105 bpm  99 bpm    Max Ex. BP 154/84 130/82    2 Minute Post BP 136/84 124/82         Interval HR   1 Minute HR 96 87    2 Minute HR 97 94    3 Minute HR 102 97    4 Minute HR 101 96    5 Minute HR 105 98    6 Minute HR 104 99    2 Minute Post HR 88 80    Interval Heart Rate? Yes Yes         Interval Oxygen   Interval Oxygen? Yes Yes    Baseline Oxygen Saturation % 97 % 97 %    1 Minute Oxygen Saturation % 96 % 96 %    1 Minute Liters of Oxygen 0 L 0 L    2 Minute Oxygen Saturation % 96 % 96 %    2 Minute Liters of Oxygen 0 L 0 L    3 Minute Oxygen Saturation % 95 % 96 %    3 Minute Liters of Oxygen 0 L 0 L    4 Minute Oxygen Saturation % 96 % 96 %    4 Minute Liters of Oxygen 0 L 0 L    5 Minute Oxygen Saturation % 97 % 96 %    5 Minute Liters of Oxygen 0 L 0 L    6 Minute Oxygen Saturation % 94 % 97 %    6 Minute Liters of Oxygen 0 L 0 L    2 Minute Post Oxygen Saturation % 96 % 97 %    2 Minute Post Liters of Oxygen 0 L 0 L          Oxygen Initial Assessment:  Oxygen Initial Assessment - 07/29/20 1138      Home Oxygen   Home Oxygen Device None    Sleep Oxygen Prescription None    Home Exercise Oxygen Prescription None    Home Resting Oxygen Prescription None      Initial 6 min Walk   Oxygen Used None      Program Oxygen Prescription   Program Oxygen Prescription None      Intervention   Short Term Goals To learn and understand importance of monitoring SPO2 with pulse oximeter and demonstrate accurate use of the pulse oximeter.;To learn and understand importance of maintaining oxygen saturations>88%;To learn and demonstrate proper pursed lip breathing techniques or other breathing techniques.;To learn and demonstrate proper use of respiratory medications    Long  Term Goals Verbalizes importance of monitoring SPO2 with pulse oximeter and return demonstration;Maintenance of O2 saturations>88%;Exhibits proper breathing techniques, such as pursed lip breathing or other  method taught during program session;Compliance with respiratory medication;Demonstrates proper use of MDI's           Oxygen Re-Evaluation:  Oxygen Re-Evaluation    Row Name 08/07/20 1620 08/22/20 1424 09/09/20 1623 10/10/20 1542 11/14/20 1342     Program Oxygen Prescription   Program Oxygen Prescription _0      Home Oxygen   Home Oxygen Device _1    Sleep Oxygen Prescription _2    Home Exercise Oxygen Prescription _3    Home Resting Oxygen Prescription None None None  None None   Compliance with Home Oxygen Use - - - No No     Goals/Expected Outcomes   Short Term Goals To learn and understand importance of monitoring SPO2 with pulse oximeter and demonstrate accurate use of the pulse oximeter.;To learn and understand importance of maintaining oxygen saturations>88%;To learn and demonstrate proper pursed lip breathing techniques or other breathing techniques.;To learn and demonstrate proper use of respiratory medications To learn and understand importance of maintaining oxygen saturations>88%;To learn and understand importance of monitoring SPO2 with pulse oximeter and demonstrate accurate use of the pulse oximeter.;To learn and demonstrate proper use of respiratory medications To learn and understand importance of maintaining oxygen saturations>88%;To learn and understand importance of monitoring SPO2 with pulse oximeter and demonstrate accurate use of the pulse oximeter.;To learn and demonstrate proper use of respiratory medications;To learn and demonstrate proper pursed lip breathing techniques or other breathing techniques. To learn and understand importance of monitoring SPO2 with pulse oximeter and demonstrate accurate use of the pulse oximeter.;To learn and understand importance of maintaining oxygen saturations>88% To learn and understand importance of monitoring SPO2 with pulse oximeter and demonstrate  accurate use of the pulse oximeter.;To learn and understand importance of maintaining oxygen saturations>88%   Long  Term Goals Verbalizes importance of monitoring SPO2 with pulse oximeter and return demonstration;Maintenance of O2 saturations>88%;Exhibits proper breathing techniques, such as pursed lip breathing or other method taught during program session;Compliance with respiratory medication;Demonstrates proper use of MDI's Maintenance of O2 saturations>88%;Verbalizes importance of monitoring SPO2 with pulse oximeter and return demonstration;Demonstrates proper use of MDI's Maintenance of O2 saturations>88%;Verbalizes importance of monitoring SPO2 with pulse oximeter and return demonstration;Demonstrates proper use of MDI's;Exhibits proper breathing techniques, such as pursed lip breathing or other method taught during program session Maintenance of O2 saturations>88%;Verbalizes importance of monitoring SPO2 with pulse oximeter and return demonstration Maintenance of O2 saturations>88%;Verbalizes importance of monitoring SPO2 with pulse oximeter and return demonstration   Comments Reviewed PLB technique with pt.  Talked about how it works and it's importance in maintaining their exercise saturations. Collie Siad states that she has been practicing PLB and has been using it. She uses her pulse oximeter at home and knows how to use it. She knows to keep her oxygen above 88 percent. She takes Flovent, Acolate, Zyrtec and Ventolin HFA as needed. She has no questions about her medications. Collie Siad is doing well with her PLB.  She finds that it is helpful for maintaining breath control and is starting to do it more without thinking about it.  She is doing well with her sats and will start to watch her sats with exercise at home. Collie Siad does not have equipment at home to exercise with. She states that she will try to use some equipment at work on her break while monitoring her SpO2. Informed her that staff can giver her guidance  of when she can use machines and also encourage her. Collie Siad is still struggling with her breathing.  It does not feel like it is improving.  She has a pulmonary follow up that is supposed to happen in March.  She has noted that her breathing has even come on at rest.  She sees her primary on Monday and we talked about a note to her pulmonologist about her breathing.   Goals/Expected Outcomes Short: Become more profiecient at using PLB.   Long: Become independent at using PLB Short: continue compliance with her medications and program regimine. Long: maintain exercise in [facility] and use breathing techniques. Short:  Continue to work on PLB montior sats with exercise at home Long; Continue to exericse to improve breathing. Short: Start monitoring SpO2 while exercising. Long: maintain exercise routine while monitoring Spo2 independently -   Row Name 11/28/20 1432             Program Oxygen Prescription   Program Oxygen Prescription None               Home Oxygen   Home Oxygen Device None       Sleep Oxygen Prescription None       Home Exercise Oxygen Prescription None       Home Resting Oxygen Prescription None               Goals/Expected Outcomes   Short Term Goals Other       Long  Term Goals Other       Comments Collie Siad has not needed to use her inhalers but feels like her shortness breath has not improved overall. She practices PLB daily and does deep breathing. She feel slike she has been able to work to her ablilty but sometimes her schedule does not allow her to be consistant with exercise.       Goals/Expected Outcomes Short: practive breathing techniques at home. Long: maintain breathing techniques and home medications independently              Oxygen Discharge (Final Oxygen Re-Evaluation):  Oxygen Re-Evaluation - 11/28/20 1432      Program Oxygen Prescription   Program Oxygen Prescription None      Home Oxygen   Home Oxygen Device None    Sleep Oxygen Prescription None     Home Exercise Oxygen Prescription None    Home Resting Oxygen Prescription None      Goals/Expected Outcomes   Short Term Goals Other    Long  Term Goals Other    Comments Collie Siad has not needed to use her inhalers but feels like her shortness breath has not improved overall. She practices PLB daily and does deep breathing. She feel slike she has been able to work to her ablilty but sometimes her schedule does not allow her to be consistant with exercise.    Goals/Expected Outcomes Short: practive breathing techniques at home. Long: maintain breathing techniques and home medications independently           Initial Exercise Prescription:  Initial Exercise Prescription - 07/29/20 1600      Date of Initial Exercise RX and Referring Provider   Date 07/29/20    Referring Provider Praveen      Treadmill   MPH 2.8    Grade 1    Minutes 15    METs 3.6      Recumbant Bike   Level 1    RPM 60    Minutes 15    METs 3.6      NuStep   Level 3    SPM 80    Minutes 15    METs 3.6      Prescription Details   Frequency (times per week) 3    Duration Progress to 30 minutes of continuous aerobic without signs/symptoms of physical distress      Intensity   THRR 40-80% of Max Heartrate 110-140    Ratings of Perceived Exertion 11-15    Perceived Dyspnea 0-4      Resistance Training   Training Prescription Yes    Weight 2 lb    Reps 10-15  Perform Capillary Blood Glucose checks as needed.  Exercise Prescription Changes:  Exercise Prescription Changes    Row Name 07/29/20 1600 08/12/20 1000 08/27/20 1000 09/09/20 1200 09/24/20 1500     Response to Exercise   Blood Pressure (Admit) 134/78 132/82 130/58 142/70 154/92   Blood Pressure (Exercise) 154/84 162/80 146/64 142/82 154/78   Blood Pressure (Exit) 136/84 130/74 122/60 124/80 120/78   Heart Rate (Admit) 81 bpm 84 bpm 78 bpm 84 bpm 79 bpm   Heart Rate (Exercise) 105 bpm 101 bpm 109 bpm 107 bpm 104 bpm   Heart Rate  (Exit) 88 bpm 84 bpm 87 bpm 88 bpm 78 bpm   Oxygen Saturation (Admit) 97 % 98 % 95 % 96 % 96 %   Oxygen Saturation (Exercise) 94 % 95 % 97 % 94 % 97 %   Oxygen Saturation (Exit) 96 % 94 % 98 % 95 % 96 %   Rating of Perceived Exertion (Exercise) _0 Perceived Dyspnea (Exercise) _1 Symptoms - - none none none   Comments - first day exercise - - -   Duration - - Continue with 30 min of aerobic exercise without signs/symptoms of physical distress. Continue with 30 min of aerobic exercise without signs/symptoms of physical distress. Continue with 30 min of aerobic exercise without signs/symptoms of physical distress.   Intensity - - THRR unchanged THRR unchanged THRR unchanged     Progression   Progression - - Continue to progress workloads to maintain intensity without signs/symptoms of physical distress. Continue to progress workloads to maintain intensity without signs/symptoms of physical distress. Continue to progress workloads to maintain intensity without signs/symptoms of physical distress.   Average METs - - 3.5 2.73 3.2     Resistance Training   Training Prescription - - Yes Yes Yes   Weight - - 2 lb 3 lb 3 lb   Reps - - 10-15 10-15 10-15     Treadmill   MPH - - 2.8 2.4 2.5   Grade - - _2 Minutes - - _3 METs - - 3.53 3.17 3.53     Recumbant Bike   Level - - 1.4 - -   Minutes - - 15 - -   METs - - 2.7 - -     NuStep   Level - - _4 SPM - - 80 80 80   Minutes - - _5 METs - - 1.6 2.1 2.9   Row Name 10/07/20 1100 10/21/20 1300 11/04/20 0900 11/20/20 1200 12/17/20 0800     Response to Exercise   Blood Pressure (Admit) 142/88 166/84 144/86 146/86 136/78   Blood Pressure (Exercise) 158/86 162/84 182/84 142/84 154/80   Blood Pressure (Exit) 110/70 142/66 122/68 134/82 130/80   Heart Rate (Admit) 84 bpm 87 bpm 80 bpm 86 bpm 71 bpm   Heart Rate (Exercise) 108 bpm 98 bpm 108 bpm 105 bpm 98 bpm   Heart Rate (Exit) 87 bpm 87 bpm 84  bpm 88 bpm 74 bpm   Oxygen Saturation (Admit) 98 % 96 % 96 % 94 % 97 %   Oxygen Saturation (Exercise) 94 % 92 % 98 % 94 % 97 %   Oxygen Saturation (Exit) 96 % 95 % 98 % 96 % 96 %   Rating of Perceived Exertion (Exercise) _6 Perceived  Dyspnea (Exercise) _0 Symptoms - - - SOB SOB   Duration Continue with 30 min of aerobic exercise without signs/symptoms of physical distress. Continue with 30 min of aerobic exercise without signs/symptoms of physical distress. Continue with 30 min of aerobic exercise without signs/symptoms of physical distress. Continue with 30 min of aerobic exercise without signs/symptoms of physical distress. Continue with 30 min of aerobic exercise without signs/symptoms of physical distress.   Intensity _1      Progression   Progression Continue to progress workloads to maintain intensity without signs/symptoms of physical distress. Continue to progress workloads to maintain intensity without signs/symptoms of physical distress. Continue to progress workloads to maintain intensity without signs/symptoms of physical distress. Continue to progress workloads to maintain intensity without signs/symptoms of physical distress. Continue to progress workloads to maintain intensity without signs/symptoms of physical distress.   Average METs 2.8 2.4 2.8 2.65 2.98     Resistance Training   Training Prescription _2    Weight 3 lb 3 lb 3 lb 3 lb 3 lb   Reps 10-15 10-15 10-15 10-15 10-15     Interval Training   Interval Training - - - No No     Treadmill   MPH 2.6 - 2.7 2.5 2.4   Grade 1 - _3 Minutes 15 - _4 METs 3.35 - 3.53 3.26 3.17     Recumbant Bike   Level - 2 - - -   Minutes - 15 - - -   METs - 1.8 - - -     NuStep   Level 2 4 - 4 4   SPM 80 80 - - -   Minutes 15 15 - 15 15   METs 1.9 1.8 - 2.7 2.8     T5 Nustep   Level - - 2 2 -   SPM - - 80 -  -   Minutes - - 15 15 -   METs - - 2 2 -     Home Exercise Plan   Plans to continue exercise at - - - Home (comment)  walking, treadmill, daughter's home gym Home (comment)  walking, treadmill, daughter's home gym   Frequency - - - Add 2 additional days to program exercise sessions. Add 2 additional days to program exercise sessions.   Initial Home Exercises Provided - - - 09/09/20 09/09/20          Exercise Comments:   Exercise Goals and Review:  Exercise Goals    Row Name 07/29/20 1613 07/29/20 1629           Exercise Goals   Increase Physical Activity Yes Yes      Intervention Provide advice, education, support and counseling about physical activity/exercise needs.;Develop an individualized exercise prescription for aerobic and resistive training based on initial evaluation findings, risk stratification, comorbidities and participant's personal goals. Provide advice, education, support and counseling about physical activity/exercise needs.;Develop an individualized exercise prescription for aerobic and resistive training based on initial evaluation findings, risk stratification, comorbidities and participant's personal goals.      Expected Outcomes Short Term: Attend rehab on a regular basis to increase amount of physical activity.;Long Term: Add in home exercise to make exercise part of routine and to increase amount of physical activity.;Long Term: Exercising regularly at least 3-5 days a week. Short Term: Attend rehab on a regular basis  to increase amount of physical activity.;Long Term: Add in home exercise to make exercise part of routine and to increase amount of physical activity.;Long Term: Exercising regularly at least 3-5 days a week.      Increase Strength and Stamina Yes Yes      Intervention Provide advice, education, support and counseling about physical activity/exercise needs.;Develop an individualized exercise prescription for aerobic and resistive training based on  initial evaluation findings, risk stratification, comorbidities and participant's personal goals. Provide advice, education, support and counseling about physical activity/exercise needs.;Develop an individualized exercise prescription for aerobic and resistive training based on initial evaluation findings, risk stratification, comorbidities and participant's personal goals.      Expected Outcomes Short Term: Increase workloads from initial exercise prescription for resistance, speed, and METs.;Short Term: Perform resistance training exercises routinely during rehab and add in resistance training at home;Long Term: Improve cardiorespiratory fitness, muscular endurance and strength as measured by increased METs and functional capacity (6MWT) Short Term: Increase workloads from initial exercise prescription for resistance, speed, and METs.;Short Term: Perform resistance training exercises routinely during rehab and add in resistance training at home;Long Term: Improve cardiorespiratory fitness, muscular endurance and strength as measured by increased METs and functional capacity (6MWT)      Able to understand and use rate of perceived exertion (RPE) scale Yes Yes      Intervention Provide education and explanation on how to use RPE scale Provide education and explanation on how to use RPE scale      Expected Outcomes Short Term: Able to use RPE daily in rehab to express subjective intensity level;Long Term:  Able to use RPE to guide intensity level when exercising independently Short Term: Able to use RPE daily in rehab to express subjective intensity level;Long Term:  Able to use RPE to guide intensity level when exercising independently      Able to understand and use Dyspnea scale Yes Yes      Intervention Provide education and explanation on how to use Dyspnea scale Provide education and explanation on how to use Dyspnea scale      Expected Outcomes Short Term: Able to use Dyspnea scale daily in rehab to  express subjective sense of shortness of breath during exertion;Long Term: Able to use Dyspnea scale to guide intensity level when exercising independently Short Term: Able to use Dyspnea scale daily in rehab to express subjective sense of shortness of breath during exertion;Long Term: Able to use Dyspnea scale to guide intensity level when exercising independently      Knowledge and understanding of Target Heart Rate Range (THRR) Yes Yes      Intervention Provide education and explanation of THRR including how the numbers were predicted and where they are located for reference Provide education and explanation of THRR including how the numbers were predicted and where they are located for reference      Expected Outcomes Short Term: Able to state/look up THRR;Short Term: Able to use daily as guideline for intensity in rehab;Long Term: Able to use THRR to govern intensity when exercising independently Short Term: Able to state/look up THRR;Short Term: Able to use daily as guideline for intensity in rehab;Long Term: Able to use THRR to govern intensity when exercising independently      Able to check pulse independently Yes Yes      Intervention Provide education and demonstration on how to check pulse in carotid and radial arteries.;Review the importance of being able to check your own pulse for safety during independent  exercise Provide education and demonstration on how to check pulse in carotid and radial arteries.;Review the importance of being able to check your own pulse for safety during independent exercise      Expected Outcomes Short Term: Able to explain why pulse checking is important during independent exercise;Long Term: Able to check pulse independently and accurately Short Term: Able to explain why pulse checking is important during independent exercise;Long Term: Able to check pulse independently and accurately      Understanding of Exercise Prescription Yes Yes      Intervention Provide  education, explanation, and written materials on patient's individual exercise prescription Provide education, explanation, and written materials on patient's individual exercise prescription      Expected Outcomes Short Term: Able to explain program exercise prescription;Long Term: Able to explain home exercise prescription to exercise independently Short Term: Able to explain program exercise prescription;Long Term: Able to explain home exercise prescription to exercise independently             Exercise Goals Re-Evaluation :  Exercise Goals Re-Evaluation    Row Name 08/07/20 1622 08/27/20 1031 09/09/20 1250 09/24/20 1528 10/07/20 1154     Exercise Goal Re-Evaluation   Exercise Goals Review Increase Physical Activity;Able to understand and use rate of perceived exertion (RPE) scale;Knowledge and understanding of Target Heart Rate Range (THRR);Understanding of Exercise Prescription;Increase Strength and Stamina;Able to understand and use Dyspnea scale;Able to check pulse independently Increase Physical Activity;Able to understand and use rate of perceived exertion (RPE) scale;Knowledge and understanding of Target Heart Rate Range (THRR);Understanding of Exercise Prescription;Increase Strength and Stamina;Able to understand and use Dyspnea scale;Able to check pulse independently Increase Physical Activity;Increase Strength and Stamina Increase Physical Activity;Increase Strength and Stamina Increase Physical Activity;Increase Strength and Stamina   Comments Reviewed RPE and dyspnea scales, THR and program prescription with pt today.  Pt voiced understanding and was given a copy of goals to take home. Collie Siad is doing well in rehab and adjusting well to the machines. Her oxygen levels are maintained well during exercise. She is still experiencing some SOB and will take breaks as needed. Will use PLB technique when she needs to. Will continue to monitor. Collie Siad has attended consistently this month.  Staff will  review home exercise in the next few sessions. Collie Siad is progressing well and has increased speed on TM and level on T4 .  We will continue to monitor progress. Collie Siad is working towards goal speed of 2.8 on TM.  Her shortness of breath has been a 1 during exercise.  Staff discussed doing only concentric work during weights to reduce muscle soreness.   Expected Outcomes Short: Use RPE daily to regulate intensity. Long: Follow program prescription in THR. Short:  Increase speed on TM Long: Continue to progress overall MET level Short: continue to exercise consistently Long:  increase overall stamina Short:  review home exercise with EP Long: add exercise on days not at [place]:  try changing up strength work to see if it reduces soreness Long:  improve overall stamina   Row Name 10/21/20 1305 11/04/20 0914 11/14/20 1339 11/20/20 1201 11/28/20 1429     Exercise Goal Re-Evaluation   Exercise Goals Review Increase Physical Activity;Increase Strength and Stamina Increase Physical Activity;Increase Strength and Stamina Increase Physical Activity;Increase Strength and Stamina;Understanding of Exercise Prescription Increase Physical Activity;Increase Strength and Stamina;Understanding of Exercise Prescription Increase Physical Activity;Increase Strength and Stamina   Comments Collie Siad has attended twice per week consistently this month.  She is up to  2.6 mph on TM.  She still has muscle soreness after exercise beyond what is normal.  Staff will monitor progress. Collie Siad tried 2.7 on TM.  Oxygen stays in upper 990s during exercise.  We will continue to monitor progress. Collie Siad is doing well in rehab.  This past week her breathing has not feeling like it getting better and exercise was harder already.  She is not been able to exercise at home with weather.  She is planning to use her daughter's home gym after graduation. She does not feel liek her stamina has really improved, she had a stretch of feeling better but then she is falling  asleep at home early evenings. Collie Siad is doing well in rehab.  She is still struggling with her breathing but she is up to 2.5 mph on the treadmill.  We will continue to monitor her progress. Collie Siad is thinking about coming back to [facility] to do another round of rehab. Her daughter has an exercise room at her house that she could do. She feels safer working out at home or in [place].   Expected Outcomes Short:try to get 3 exercise sessions a week Long: improve overall stamina Short: get 3 sessions per week Long: increase MET level and stamina - Short: Get back to routine attendance Long: Continue to improve stamina. Short: continue to exercise post [facility]. Long: maintain an exercise routine independently   Row Name 12/02/20 1625 12/17/20 0846           Exercise Goal Re-Evaluation   Exercise Goals Review Increase Physical Activity;Increase Strength and Stamina Increase Physical Activity;Increase Strength and Stamina;Understanding of Exercise Prescription      Comments Collie Siad will graduate in 4  sessions. Collie Siad did not improve her post 6MWT as her breathing is not getting better.  However, she is also not doing her home exercise.  She is set to graduate on her next visit.  She would like to re-enroll in rehab if given permission from her doctor.  She will need to establish a better routine for exercise to reap the benefits.      Expected Outcomes Short: complete [facility] program Long:  maintain exercise on her own Graduate and exercise independently             Discharge Exercise Prescription (Final Exercise Prescription Changes):  Exercise Prescription Changes - 12/17/20 0800      Response to Exercise   Blood Pressure (Admit) 136/78    Blood Pressure (Exercise) 154/80    Blood Pressure (Exit) 130/80    Heart Rate (Admit) 71 bpm    Heart Rate (Exercise) 98 bpm    Heart Rate (Exit) 74 bpm    Oxygen Saturation (Admit) 97 %    Oxygen Saturation (Exercise) 97 %    Oxygen Saturation (Exit) 96 %    Rating of  Perceived Exertion (Exercise) 14    Perceived Dyspnea (Exercise) 3    Symptoms SOB    Duration Continue with 30 min of aerobic exercise without signs/symptoms of physical distress.    Intensity THRR unchanged      Progression   Progression Continue to progress workloads to maintain intensity without signs/symptoms of physical distress.    Average METs 2.98      Resistance Training   Training Prescription Yes    Weight 3 lb    Reps 10-15      Interval Training   Interval Training No      Treadmill   MPH 2.4    Grade 1  Minutes 15    METs 3.17      NuStep   Level 4    Minutes 15    METs 2.8      Home Exercise Plan   Plans to continue exercise at Home (comment)   walking, treadmill, daughter's home gym   Frequency Add 2 additional days to program exercise sessions.    Initial Home Exercises Provided 09/09/20           Nutrition:  Target Goals: Understanding of nutrition guidelines, daily intake of sodium '1500mg'$ , cholesterol '200mg'$ , calories 30% from fat and 7% or less from saturated fats, daily to have 5 or more servings of fruits and vegetables.  Education: All About Nutrition: -Group instruction provided by verbal, written material, interactive activities, discussions, models, and posters to present general guidelines for heart healthy nutrition including fat, fiber, MyPlate, the role of sodium in heart healthy nutrition, utilization of the nutrition label, and utilization of this knowledge for meal planning. Follow up email sent as well. Written material given at graduation. Flowsheet Row Pulmonary Rehab from 09/04/2020 in [facility]  Date 09/04/20  Educator [facility]  Instruction Review Code 1- Verbalizes Understanding      Biometrics:  Pre Biometrics - 07/29/20 1620      Pre Biometrics   Height $Remov'5\' 5"'xKNhRR$  (1.651 m)    Weight 180 lb 3.2 oz (81.7 kg)    BMI (Calculated) 29.99    Single Leg Stand 11.63 seconds           Post Biometrics -  11/25/20 1616       Post  Biometrics   Height $Remov'5\' 6"'HuUjIC$  (1.676 m)    Weight 184 lb (83.5 kg)    BMI (Calculated) 29.71    Single Leg Stand 11.83 seconds           Nutrition Therapy Plan and Nutrition Goals:  Nutrition Therapy & Goals - 08/05/20 1313      Nutrition Therapy   Diet Heart healthy, low Na, Pulmonary MNT    Drug/Food Interactions Statins/Certain Fruits    Protein (specify units) 75g    Fiber 25 grams    Whole Grain Foods 3 servings    Saturated Fats 12 max. grams    Fruits and Vegetables 5 servings/day    Sodium 1.5 grams      Personal Nutrition Goals   Nutrition Goal LT: weight increasing would like to maintain, improve breathing 7/10 now - when bad - 1-2 x/week sometimes after meals, rebuild microbiome    Comments Vitamin D prescription 50000 IUs. LDL has decreased and is now below or at 70. Low histamine diet. If B: oatmeal with raisins and brown sugar (at home blueberries) L: not big on lunch - at cafeteria (fish or meat that is more plain and two vegetables that are more plain) - will retain fluid (salt). Will try to not get restaurant food. Since COVID - will buy chicken, 93% beef - make hamburgers (freezes) (2-3x/week), pork roast, fressh fruits and vegetables usually comes bad from instant cart (carrots, potatoes, zucchini).  [place] chicken, salad, cheese, orange (canned). Tries to eat low sodium. snacks: choccolate and bag of chips, not eating lunch much. D: by 6pm most nights. chicken - chicken pie (makes own sauce with cheese, mushrooms, onions, carrots, bisquikc mix for crust), chicken and brown rice (chicken broth and cream cheese), hamburgers, meatloaf. likes 20 minute cooked meals. [place] ripple chips (low Na). water during the day. Appetite is low, taste and  smell is altered. Will see doctor next monday for taste. Not a specific food that tastes different- it changes. Discussed adding two hearty snacks in the middle of the day; protein balls, tuna, bean salad,  cucumbers/marys crackers/gluten free pretzles and hummus or other dip (she doesn't like garlic). Discussed greek yogurt after she sees doctor to see if she likes it. Collie Siad is gluten free, and allergic to tree nuts, eggs, peas, celery, milk, most raw vegetables - some cooked vegetables. discussed seeds, protein balls, proats.      Intervention Plan   Intervention Prescribe, educate and counsel regarding individualized specific dietary modifications aiming towards targeted core components such as weight, hypertension, lipid management, diabetes, heart failure and other comorbidities.;Nutrition handout(s) given to patient.    Expected Outcomes Short Term Goal: Understand basic principles of dietary content, such as calories, fat, sodium, cholesterol and nutrients.;Short Term Goal: A plan has been developed with personal nutrition goals set during dietitian appointment.;Long Term Goal: Adherence to prescribed nutrition plan.           Nutrition Assessments:  Nutrition Assessments - 11/28/20 1644      MEDFICTS Scores   Post Score 24          MEDIFICTS Score Key:  ?70 Need to make dietary changes   40-70 Heart Healthy Diet  ? 40 Therapeutic Level Cholesterol Diet   Picture Your Plate Scores:  <42 Unhealthy dietary pattern with much room for improvement.  41-50 Dietary pattern unlikely to meet recommendations for good health and room for improvement.  51-60 More healthful dietary pattern, with some room for improvement.   >60 Healthy dietary pattern, although there may be some specific behaviors that could be improved.   Nutrition Goals Re-Evaluation:  Nutrition Goals Re-Evaluation    [place] Name 09/09/20 1620 10/10/20 1549 11/14/20 1351 11/28/20 1440       Goals   Current Weight - 181 lb (82.1 kg) - 180 lb (81.6 kg)    Nutrition Goal LT: weight increasing would like to maintain, improve breathing 7/10 now - when bad - 1-2 x/week sometimes after meals, rebuild microbiome Lose Weight.  Add snack and small meal breakfast lose weight. Lacinda Axon more rather than eating out.    Comment She has met with dietitian is aware of changes to to make.  She is trying to balance her fruits and vegetables with what she is able to eat and what are low in histimaine responses.  She wants to get back to the store again but trying to find the best time to go.  She really wants to get back [employer] which is local and can help. Collie Siad has been watching her sodium intake. She is adding fruits to her salads at home. Her steroids make it hard for her to lose weight. She is so busy at work that sometimes she does not eat enough. Collie Siad states that her diet is sporadic and she knows she needs to eat more. Collie Siad has been doing better about getting breakfast.  She is limited in what she can get from cafeteria and her taste buds are still recovering.  Her taste buds are still coming and going.  She continues to try to make time to eat at work. She has been eating out sometimes but would like to get back cooking.Collie Siad gets tired by the end of the day and does have the help at home to cook meals daily.    Expected Outcome Short: Continue to add in fruits and vegetables  Long: Continue to make changes to help. Short: add a snack or a small meal for breakfast. Long: maintain a routine diet. Short: Continue to get breakfast each day  Long: Continue to work on diet. Short: cook more at home. Long: lose more weight with cooking at home.           Nutrition Goals Discharge (Final Nutrition Goals Re-Evaluation):  Nutrition Goals Re-Evaluation - 11/28/20 1440      Goals   Current Weight 180 lb (81.6 kg)    Nutrition Goal lose weight. Lacinda Axon more rather than eating out.    Comment She has been eating out sometimes but would like to get back cooking.Collie Siad gets tired by the end of the day and does have the help at home to cook meals daily.    Expected Outcome Short: cook more at home. Long: lose more weight with cooking at home.            Psychosocial: Target Goals: Acknowledge presence or absence of significant depression and/or stress, maximize coping skills, provide positive support system. Participant is able to verbalize types and ability to use techniques and skills needed for reducing stress and depression.   Education: Stress, Anxiety, and Depression - Group verbal and visual presentation to define topics covered.  Reviews how body is impacted by stress, anxiety, and depression.  Also discusses healthy ways to reduce stress and to treat/manage anxiety and depression.  Written material given at graduation.   Education: Sleep Hygiene -Provides group verbal and written instruction about how sleep can affect your health.  Define sleep hygiene, discuss sleep cycles and impact of sleep habits. Review good sleep hygiene tips.    Initial Review & Psychosocial Screening:  Initial Psych Review & Screening - 07/29/20 1141      Initial Review   Current issues with Current Stress Concerns    Source of Stress Concerns Unable to perform yard/household activities    Comments Since COVID has been ongoing she deals with the daily stress of that. She has had Covid and has been dealing with some side effects. She has been more short of breath recently and needs some conditioning to help with her shortness of breath. She feels optimisitc about her health and wants to get better with exercise.      Family Dynamics   Good Support System? Yes    Comments She can look to her husband, dog, three daughters and co-workers for support.      Barriers   Psychosocial barriers to participate in program The patient should benefit from training in stress management and relaxation.      Screening Interventions   Interventions To provide support and resources with identified psychosocial needs;Provide feedback about the scores to participant;Encouraged to exercise    Expected Outcomes Short Term goal: Utilizing psychosocial counselor,  staff and physician to assist with identification of specific Stressors or current issues interfering with healing process. Setting desired goal for each stressor or current issue identified.;Long Term Goal: Stressors or current issues are controlled or eliminated.;Short Term goal: Identification and review with participant of any Quality of Life or Depression concerns found by scoring the questionnaire.;Long Term goal: The participant improves quality of Life and PHQ9 Scores as seen by post scores and/or verbalization of changes           Quality of Life Scores:  Scores of 19 and below usually indicate a poorer quality of life in these areas.  A difference of  2-3 points  is a clinically meaningful difference.  A difference of 2-3 points in the total score of the Quality of Life Index has been associated with significant improvement in overall quality of life, self-image, physical symptoms, and general health in studies assessing change in quality of life.  PHQ-9: Recent Review Flowsheet Data    Depression screen [facility] 2/9 11/28/2020 07/29/2020 02/08/2020 03/28/2015   Decreased Interest 0 0 0 0   Down, Depressed, Hopeless 0 0 0 0   PHQ - 2 Score 0 0 0 0   Altered sleeping 1 0 - -   Tired, decreased energy 3 3  - -   Change in appetite 0 0 - -   Feeling bad or failure about yourself  0 0 - -   Trouble concentrating 1 0 - -   Moving slowly or fidgety/restless 0 0 - -   Suicidal thoughts 0 0 - -   PHQ-9 Score 5 3 - -   Difficult doing work/chores Not difficult at all Not difficult at all - -     Interpretation of Total Score  Total Score Depression Severity:  1-4 = Minimal depression, 5-9 = Mild depression, 10-14 = Moderate depression, 15-19 = Moderately severe depression, 20-27 = Severe depression   Psychosocial Evaluation and Intervention:  Psychosocial Evaluation - 07/29/20 1147      Psychosocial Evaluation & Interventions   Interventions Encouraged to exercise with the program and follow  exercise prescription;Stress management education;Relaxation education    Comments Since COVID has been ongoing she deals with the daily stress of that. She has had Covid and has been dealing with some side effects. She has been more short of breath recently and needs some conditioning to help with her shortness of breath. She feels optimisitc about her health and wants to get better with exercise.    Expected Outcomes Short: Attend [facility] to improve move. Long: maintain a positive outllok on health and graduate [facility].    Continue Psychosocial Services  Follow up required by staff           Psychosocial Re-Evaluation:  Psychosocial Re-Evaluation    [place] Name 08/22/20 1437 09/09/20 1614 10/10/20 1557 11/14/20 1348 11/28/20 1442     Psychosocial Re-Evaluation   Current issues with _0    Comments She gets stressed with not being able to breath at times and gets short of breath. Collie Siad has a positive outlook on her health and wants to get healthier. Collie Siad is doing well mentally.  We made it through [place] which is a weight off her shoulders.  She still gets frustrated with not being able to do what she wants. Since COVID, she is sleeping better than ever.   She still gets up some at night, but is able to get back to sleep easily.  She continues to work on her breathing.  Her biggest stressor is still not being able to see her grandkids and hug them.  It has been over 3 years since she saw them. Collie Siad is stressed about Christmas since she has not seen her daughter in three years. She does not want to talk about it becuase she may end up crying. She has taken off in June to try to make it work. Collie Siad is still working on her stress.  She misses her kids and grandkids.  She is trying to find time and things to do with them to be more engaging.  She also has a lot of stress at work recently  with the weather and scheduling with staff.  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  She continues to sleep well but still having the vivid dreams as well.  She is sleeping better now and falls asleep early in the evening. Collie Siad has the stress of not seeing her family and grandkids. They live far away but she is happy that one of her daughters lives two miles from her. Her mood is stable and has no other issues with mental health at this time.   Expected Outcomes Short: continue to exercise to improve shortness of breath and mood. Long: maintain exercise post [facility] to keep stress at a minimum. Short: Continue to work on breathing and stamina  Long; Continue to talk to girls over the phone. Short: continue to exercise to keep stress at a minimum. Long: maintain exercise independently to keep stress minimal. Short: Exercise for stress relief. Long: Continue to aim for positive thoughts -   Interventions Encouraged to attend Pulmonary Rehabilitation for the exercise Encouraged to attend Pulmonary Rehabilitation for the exercise Encouraged to attend Pulmonary Rehabilitation for the exercise Encouraged to attend Pulmonary Rehabilitation for the exercise Encouraged to attend Pulmonary Rehabilitation for the exercise   Continue Psychosocial Services  Follow up required by staff Follow up required by staff Follow up required by staff Follow up required by staff No Follow up required     Initial Review   Source of Stress Concerns - Unable to perform yard/household activities - - -          Psychosocial Discharge (Final Psychosocial Re-Evaluation):  Psychosocial Re-Evaluation - 11/28/20 1442      Psychosocial Re-Evaluation   Current issues with Current Stress Concerns    Comments Collie Siad has the stress of not seeing her family and grandkids. They live far away but she is happy that one of her daughters lives two miles from her. Her mood is stable and has no other issues  with mental health at this time.    Interventions Encouraged to attend Pulmonary Rehabilitation for the exercise    Continue Psychosocial Services  No Follow up required           Education: Education Goals: Education classes will be provided on a weekly basis, covering required topics. Participant will state understanding/return demonstration of topics presented.  Learning Barriers/Preferences:  Learning Barriers/Preferences - 07/29/20 1139      Learning Barriers/Preferences   Learning Barriers Sight    Learning Preferences None           General Pulmonary Education Topics:  Infection Prevention: - Provides verbal and written material to individual with discussion of infection control including proper hand washing and proper equipment cleaning during exercise session. Flowsheet Row Pulmonary Rehab from 09/04/2020 in [facility] Cardiac and Pulmonary Rehab  Date 07/29/20  Educator [facility]  Instruction Review Code 1- Verbalizes Understanding      Falls Prevention: - Provides verbal and written material to individual with discussion of falls prevention and safety. Flowsheet Row Pulmonary Rehab from 09/04/2020 in [facility] Cardiac and Pulmonary Rehab  Date 07/29/20  Educator [facility]  Instruction Review Code 1- Verbalizes Understanding      Chronic Lung Disease Review: - Group verbal instruction with posters, models, PowerPoint presentations and videos,  to review new updates, new respiratory medications, new advancements in procedures and treatments. Providing information on websites and "800" numbers for continued self-education. Includes information about  supplement oxygen, available portable oxygen systems, continuous and intermittent flow rates, oxygen safety, concentrators, and Medicare reimbursement for oxygen. Explanation of Pulmonary Drugs, including class, frequency, complications, importance of spacers, rinsing mouth after steroid MDI's, and proper cleaning methods for nebulizers. Review  of basic lung anatomy and physiology related to function, structure, and complications of lung disease. Review of risk factors. Discussion about methods for diagnosing sleep apnea and types of masks and machines for OSA. Includes a review of the use of types of environmental controls: home humidity, furnaces, filters, dust mite/pet prevention, HEPA vacuums. Discussion about weather changes, air quality and the benefits of nasal washing. Instruction on Warning signs, infection symptoms, calling MD promptly, preventive modes, and value of vaccinations. Review of effective airway clearance, coughing and/or vibration techniques. Emphasizing that all should Create an Action Plan. Written material given at graduation.   AED/CPR: - Group verbal and written instruction with the use of models to demonstrate the basic use of the AED with the basic ABC's of resuscitation.    Anatomy and Cardiac Procedures: - Group verbal and visual presentation and models provide information about basic cardiac anatomy and function. Reviews the testing methods done to diagnose heart disease and the outcomes of the test results. Describes the treatment choices: Medical Management, Angioplasty, or Coronary Bypass Surgery for treating various heart conditions including Myocardial Infarction, Angina, Valve Disease, and Cardiac Arrhythmias.  Written material given at graduation.   Medication Safety: - Group verbal and visual instruction to review commonly prescribed medications for heart and lung disease. Reviews the medication, class of the drug, and side effects. Includes the steps to properly store meds and maintain the prescription regimen.  Written material given at graduation.   Other: -Provides group and verbal instruction on various topics (see comments)   Knowledge Questionnaire Score:  Knowledge Questionnaire Score - 11/28/20 1644      Knowledge Questionnaire Score   Post Score 18/18            Core  Components/Risk Factors/Patient Goals at Admission:  Personal Goals and Risk Factors at Admission - 07/29/20 1634      Core Components/Risk Factors/Patient Goals on Admission    Weight Management Yes;Weight Loss    Intervention Weight Management: Develop a combined nutrition and exercise program designed to reach desired caloric intake, while maintaining appropriate intake of nutrient and fiber, sodium and fats, and appropriate energy expenditure required for the weight goal.;Weight Management: Provide education and appropriate resources to help participant work on and attain dietary goals.;Weight Management/Obesity: Establish reasonable short term and long term weight goals.    Admit Weight 180 lb 3.2 oz (81.7 kg)    Goal Weight: Short Term 175 lb (79.4 kg)    Goal Weight: Long Term 170 lb (77.1 kg)    Expected Outcomes Short Term: Continue to assess and modify interventions until short term weight is achieved;Long Term: Adherence to nutrition and physical activity/exercise program aimed toward attainment of established weight goal;Weight Maintenance: Understanding of the daily nutrition guidelines, which includes 25-35% calories from fat, 7% or less cal from saturated fats, less than 224m cholesterol, less than 1.5gm of sodium, & 5 or more servings of fruits and vegetables daily;Weight Loss: Understanding of general recommendations for a balanced deficit meal plan, which promotes 1-2 lb weight loss per week and includes a negative energy balance of [phone] kcal/d;Understanding recommendations for meals to include 15-35% energy as protein, 25-35% energy from fat, 35-60% energy from carbohydrates, less than 2066mof dietary cholesterol, 20-35 gm  of total fiber daily;Understanding of distribution of calorie intake throughout the day with the consumption of 4-5 meals/snacks    Improve shortness of breath with ADL's Yes    Intervention Provide education, individualized exercise plan and daily activity  instruction to help decrease symptoms of SOB with activities of daily living.    Expected Outcomes Short Term: Improve cardiorespiratory fitness to achieve a reduction of symptoms when performing ADLs;Long Term: Be able to perform more ADLs without symptoms or delay the onset of symptoms    Hypertension Yes    Intervention Provide education on lifestyle modifcations including regular physical activity/exercise, weight management, moderate sodium restriction and increased consumption of fresh fruit, vegetables, and low fat dairy, alcohol moderation, and smoking cessation.;Monitor prescription use compliance.    Expected Outcomes Short Term: Continued assessment and intervention until BP is < 140/80m HG in hypertensive participants. < 130/894mHG in hypertensive participants with diabetes, heart failure or chronic kidney disease.;Long Term: Maintenance of blood pressure at goal levels.    Lipids Yes    Intervention Provide education and support for participant on nutrition & aerobic/resistive exercise along with prescribed medications to achieve LDL <707mHDL >69m58m  Expected Outcomes Short Term: Participant states understanding of desired cholesterol values and is compliant with medications prescribed. Participant is following exercise prescription and nutrition guidelines.;Long Term: Cholesterol controlled with medications as prescribed, with individualized exercise RX and with personalized nutrition plan. Value goals: LDL < 70mg27mL > 40 mg.           Education:Diabetes - Individual verbal and written instruction to review signs/symptoms of diabetes, desired ranges of glucose level fasting, after meals and with exercise. Acknowledge that pre and post exercise glucose checks will be done for 3 sessions at entry of program.   Know Your Numbers and Heart Failure: - Group verbal and visual instruction to discuss disease risk factors for cardiac and pulmonary disease and treatment options.   Reviews associated critical values for Overweight/Obesity, Hypertension, Cholesterol, and Diabetes.  Discusses basics of heart failure: signs/symptoms and treatments.  Introduces Heart Failure Zone chart for action plan for heart failure.  Written material given at graduation.   Core Components/Risk Factors/Patient Goals Review:   Goals and Risk Factor Review    Row Name 08/22/20 1429 09/09/20 1617 10/10/20 1547 11/14/20 1345 11/28/20 1436     Core Components/Risk Factors/Patient Goals Review   Personal Goals Review Weight Management/Obesity;Improve shortness of breath with ADL's Weight Management/Obesity;Improve shortness of breath with ADL's;Hypertension Weight Management/Obesity;Improve shortness of breath with ADL's Weight Management/Obesity;Improve shortness of breath with ADL's;Hypertension Improve shortness of breath with ADL's;Hypertension;Weight Management/Obesity   Review If sue gets up at night and walks up the three steps to go to the bathroom she will get short of breath. She is going to try to sit up first take a few deep breaths then get up. Work on PLB while doing strenous activities. Sue iCollie Siadaintaining her weight.  She is usually up and down 3-4 lbs.  The predinisone is not helping and she is salt sensitive.  She has tried to start taking her Lasix a little more frequently to help.  Her breathing is starting to improve, but it is still there and come on strong.  Her blood pressures are doing better but are still high in doctor's office 140/92, but in class they are doing well. Sue iCollie Siadorking on losing weight and has been gaining. She is taking steriods which makes it hard for her to lose weight.Spoke  to patient about their shortness of breath and what they can do to improve. Patient has been informed of breathing techniques when starting the program. Patient is informed to tell staff if they have had any med changes and that certain meds they are taking or not taking can be causing  shortness of breath. Collie Siad has a primary care visit on Monday.  Her weight is relatively stable it goes up and down with fluid levels.   Her blood pressures are high prior to exercise, but good with recovery.  She is checking it fairly routinely.  She is doing well with her meds.  Her breathing has been getting worse and is now happening at rest too. She is going to talk to doctor about it and send note to pulmonologist. Collie Siad has not lost weight since the start of the program and is not sure why she is not losing weight. Sometimes she stil gets shorts of breath when going upstairs. Her blood pressure has been stable but has not got lower since the start of the program. She would still like to lose more weight to aid in her breathing.   Expected Outcomes Short: Work on PLB when doing ADL's. Long: maintain breathing techniques at home independently. Short: Continue to work on [employer] Long; Continue to improve breathing. Short: Attend [facility] regularly to improve shortness of breath with ADL's. Long: maintain independence with ADL's Short: Talk to doc about breathing Long: Continue to work on risk factors. Short: lose more weight by graduation day. Long: maintain weight loss independently          Core Components/Risk Factors/Patient Goals at Discharge (Final Review):   Goals and Risk Factor Review - 11/28/20 1436      Core Components/Risk Factors/Patient Goals Review   Personal Goals Review Improve shortness of breath with ADL's;Hypertension;Weight Management/Obesity    Review Collie Siad has not lost weight since the start of the program and is not sure why she is not losing weight. Sometimes she stil gets shorts of breath when going upstairs. Her blood pressure has been stable but has not got lower since the start of the program. She would still like to lose more weight to aid in her breathing.    Expected Outcomes Short: lose more weight by graduation day. Long: maintain weight loss independently            ITP Comments:  ITP Comments    Row Name 07/29/20 1159 07/29/20 1634 07/31/20 0733 08/05/20 1525 08/07/20 1620   ITP Comments In person Visit completed. Patient informed on EP and RD appointment and 6 Minute walk test. Patient also informed of patient health questionnaires on My Chart. Patient Verbalizes understanding. Visit diagnosis can be found in [facility] 07/19/2020. Completed 6MWT and gym orientation. Initial ITP created and sent for review to Dr. Emily Filbert, Medical Director. 30 day review completed. ITP sent to Dr. Emily Filbert, Medical Director of Cardiac and Pulmonary Rehab. Continue with ITP unless changes are made by physician.  Patient is new to program. Completed Initial RD Evaluation First full day of exercise!  Patient was oriented to gym and equipment including functions, settings, policies, and procedures.  Patient's individual exercise prescription and treatment plan were reviewed.  All starting workloads were established based on the results of the 6 minute walk test done at initial orientation visit.  The plan for exercise progression was also introduced and progression will be customized based on patient's performance and goals.   [place] Name 08/28/20 [phone] 09/25/20  1002 10/23/20 0825 11/20/20 0725 12/18/20 0752   ITP Comments 30 day review completed. ITP sent to Dr. Emily Filbert, Medical Director of Cardiac and Pulmonary Rehab. Continue with ITP unless changes are made by physician. 30 Day review completed. Medical Director ITP review done, changes made as directed, and signed approval by Medical Director. 30 Day review completed. Medical Director ITP review done, changes made as directed, and signed approval by Medical Director. 30 day review completed. ITP sent to Dr. Emily Filbert, Medical Director of Cardiac and Pulmonary Rehab. Continue with ITP unless changes are made by physician. 30 day review completed. ITP sent to Dr. Emily Filbert, Medical Director of Cardiac and Pulmonary Rehab.  Continue with ITP unless changes are made by physician.          Comments: 30 day review

## 2020-12-20 DIAGNOSIS — M810 Age-related osteoporosis without current pathological fracture: Secondary | ICD-10-CM | POA: Diagnosis not present

## 2020-12-23 MED FILL — XOLAIR 150 MG SOLR: 150 | 28 days supply | Qty: 2 | Fill #6

## 2020-12-24 ENCOUNTER — Encounter: Payer: Self-pay | Admitting: Allergy & Immunology

## 2020-12-24 ENCOUNTER — Ambulatory Visit (INDEPENDENT_AMBULATORY_CARE_PROVIDER_SITE_OTHER): Payer: 59 | Admitting: Allergy & Immunology

## 2020-12-24 ENCOUNTER — Other Ambulatory Visit: Payer: Self-pay

## 2020-12-24 VITALS — BP 136/80 | HR 98 | Temp 97.6°F | Resp 18 | Ht 65.0 in | Wt 108.0 lb

## 2020-12-24 DIAGNOSIS — L508 Other urticaria: Secondary | ICD-10-CM | POA: Diagnosis not present

## 2020-12-24 DIAGNOSIS — U099 Post covid-19 condition, unspecified: Secondary | ICD-10-CM | POA: Diagnosis not present

## 2020-12-24 DIAGNOSIS — J4540 Moderate persistent asthma, uncomplicated: Secondary | ICD-10-CM

## 2020-12-24 NOTE — Patient Instructions (Addendum)
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 - Tammy will let you know when this is approved. - Continue with cetirizine 10mg  at night (you can add on a morning dose if you want to see if that helps).   2. Moderate persistent asthma, uncomplicated - Lung testing looked good back in October.  - We are not going to make any changes at this time. - Look for previous IgE levels before you started Xolair. - Daily controller medication(s): Flovent 140mcg 2 puffs twice daily with spacer - Prior to physical activity: albuterol 2 puffs 10-15 minutes before physical activity. - Rescue medications: albuterol 4 puffs every 4-6 hours as needed - Changes during respiratory infections or worsening symptoms: Increase Flovent 127mcg to 4 puffs twice daily for TWO WEEKS. - Asthma control goals:  * Full participation in all desired activities (may need albuterol before activity) * Albuterol use two time or less a week on average (not counting use with activity) * Cough interfering with sleep two time or less a month * Oral steroids no more than once a year * No hospitalizations  3. Return in about 6 months (around 06/26/2021).    Please inform us of any Emergency Department visits, hospitalizations, or changes in symptoms. Call us before going to the ED for breathing or allergy symptoms since we might be able to fit you in for a sick visit. Feel free to contact us anytime with any questions, problems, or concerns.  It was a pleasure to see you again today!  Websites that have reliable patient information: 1. American Academy of Asthma, Allergy, and Immunology: www.aaaai.org 2. Food Allergy Research and Education (FARE): foodallergy.org 3. Mothers of Asthmatics: http://www.asthmacommunitynetwork.org 4. American College of Allergy, Asthma, and Immunology: www.acaai.org   COVID-19 Vaccine Information can be found at:  [email or web address] For questions related to vaccine distribution or appointments, please email [email or web address] or call [phone].   We realize that you might be concerned about having an allergic reaction to the COVID19 vaccines. To help with that concern, WE ARE OFFERING THE COVID19 VACCINES IN OUR OFFICE! Ask the front desk for dates!     "Like" [place] on Facebook and Instagram for our latest updates!      A healthy democracy works best when [employer] participate! Make sure you are registered to vote! If you have moved or changed any of your contact information, you will need to get this updated before voting!  In some cases, you MAY be able to register to vote online: [email or web address]

## 2020-12-24 NOTE — Progress Notes (Signed)
FOLLOW UP  Date of Service/Encounter:  12/24/20   Assessment:   PostCOVIDsyndrome  Chronic idiopathic urticaria - improved by not fully resolved on Xolair  Moderate persistent asthma, uncomplicated - present before COVID19 infection (patient will be getting [place] prior IgE levels before starting Xolair to see if she would qualify for it from an asthma perspective)   Plan/Recommendations:   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 - Tammy will let you know when this is approved. - Continue with cetirizine 10mg  at night (you can add on a morning dose if you want to see if that helps).   2. Moderate persistent asthma, uncomplicated - Lung testing looked good back in October.  - We are not going to make any changes at this time. - Look for previous IgE levels before you started Xolair. - Daily controller medication(s): Flovent 17mcg 2 puffs twice daily with spacer - Prior to physical activity: albuterol 2 puffs 10-15 minutes before physical activity. - Rescue medications: albuterol 4 puffs every 4-6 hours as needed - Changes during respiratory infections or worsening symptoms: Increase Flovent 130mcg to 4 puffs twice daily for TWO WEEKS. - Asthma control goals:  * Full participation in all desired activities (may need albuterol before activity) * Albuterol use two time or less a week on average (not counting use with activity) * Cough interfering with sleep two time or less a month * Oral steroids no more than once a year * No hospitalizations  3. Return in about 6 months (around 06/26/2021).   Subjective:   Rachel Fry is a 65 y.o. female presenting today for follow up of  Chief Complaint  Patient presents with  . Urticaria    No flare   . Rash    Rachel Fry has a history of the following: Patient Active Problem List   Diagnosis Date Noted  . [place] 08/12/2020  . Anosmia  08/12/2020  . Myalgia, epidemic 08/12/2020  . Cough with exposure to COVID-19 virus 08/12/2020  . Olfactory impairment 05/29/2020  . Acute UTI 04/23/2020  . Left ureteral stone 04/23/2020  . Intermittent chest pain 02/09/2020  . History of COVID-19 02/09/2020  . Shortness of breath 02/09/2020  . Loss of taste 02/09/2020  . Loss of smell 02/09/2020  . Rash 02/09/2020  . Arthralgia 02/09/2020  . Postoperative history of checked last year 04/22/2017  . Weakness of left arm 04/22/2017  . Numbness and tingling in left arm 04/22/2017  . Lower back pain 04/02/2016  . Acute stress disorder 02/25/2015  . Anxiety 02/25/2015  . Airway hyperreactivity 02/25/2015  . Bradycardia 02/25/2015  . Hypersomnia 02/25/2015  . Clinical depression 02/25/2015  . Elevated blood sugar 02/25/2015  . 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 Acid reflux 02/25/2015  . 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 H/O disease 02/25/2015  . Hypercholesteremia 02/25/2015  . Headache, migraine 02/25/2015  . Muscle ache 02/25/2015  . Allergic rhinitis, seasonal 02/25/2015  . Avitaminosis D 02/25/2015    History obtained from: chart review and patient.  Rachel Fry is a 65 y.o. female presenting for a follow up visit. She was last seen in November 2021.  At that time, we discussed getting Xolair every 2 weeks.  For her urticaria.  We continue with cetirizine 10 mg at night.  She has been intolerant of antihistamines in the past.  Since last visit, she has done well. 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   Asthma/Respiratory Symptom History: She is on the Flovent two puffs twice daily. She has been on this for a long time. She uses her rescue inhaler very rarely, around a few times per year. Pollen makes it worse. Perfumes make it worse. She was using the albuterol frequently post-COVID, but she somewhat stopped it. She has it use as needed. She is on the Accolate. She was on Singulair  but she did not feel that it worked as well. She has not had an asthma attack for 25 years or so. She is on Accolate BID as well. Previously her asthma was managed by an ENT in [place], so she would like [place] to just handle that so that she  Can have one less specialist.   She has been on Advair but it was causing issues with inability to sleep. She has been on [place] but she developed hoarseness. She did not tolerate that or even Trelegy. The Flovent is the only one that has tolerated without a problem.    Urticaria Symptom History: She has not had dermatographia or hives since after the second injection. She was approved on 300mg  every 28 days. Evidently this was not approved but was told that she would only be approved if she had asthma. She is on the cetirizine daily.   She remains in smell therapy. She says she is not going well with that.   She is starting a treatment Tymlos daily injections. 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 She has been on that for years for autoimmune issues. She also has intermittent bursts of prednisone for control of pain.   Her [place] is being handled by her PCP, who is following [facility] guidelines. This is the group that pushes for ivermectin use as well as several other supplements. Evidently she was on ivermectin but never felt any better with this. Regardless, she is mostly focusing on the nutritional supplements.   Otherwise, there have been no changes to her past medical history, surgical history, family history, or social history.    Review of Systems  Constitutional: Negative.  Negative for chills, fever, malaise/fatigue and weight loss.  HENT: Positive for congestion. Negative for ear discharge, ear pain and sinus pain.   Eyes: Negative for pain, discharge and redness.  Respiratory: Positive for shortness of breath. Negative for cough, sputum production and wheezing.    Cardiovascular: Negative.  Negative for chest pain and palpitations.  Gastrointestinal: Negative for abdominal pain, constipation, diarrhea, heartburn, nausea and vomiting.  Skin: Positive for itching. Negative for rash.  Neurological: Negative for dizziness and headaches.  Endo/Heme/Allergies: Positive for environmental allergies. Does not bruise/bleed easily.       Objective:   Blood pressure 136/80, pulse 98, temperature 97.6 F (36.4 C), resp. rate 18, height 5\' 5"  (1.651 m), weight 108 lb (49 kg), SpO2 95 %. Body mass index is 17.97 kg/m.   Physical Exam:  Physical Exam Constitutional:      Appearance: She is well-developed.     Comments: Talkative.  HENT:     Head: Normocephalic and atraumatic.     Right Ear: Tympanic membrane, ear canal and external ear normal.     Left Ear: Tympanic membrane, ear canal and external ear normal.     Nose: No nasal deformity, septal deviation, mucosal edema, rhinorrhea or epistaxis.     Right Turbinates: Enlarged  and swollen.     Left Turbinates: Enlarged and swollen.     Right Sinus: No maxillary sinus tenderness or frontal sinus tenderness.     Left Sinus: No maxillary sinus tenderness or frontal sinus tenderness.     Comments: Clear rhinorrhea bilaterally.    Mouth/Throat:     Lips: Pink.     Mouth: Oropharynx is clear and moist. Mucous membranes are moist. Mucous membranes are not pale and not dry.     Pharynx: Uvula midline.     Comments: Moving air well in all lung fields. No increased work of breathing noted.  Eyes:     General:        Right eye: No discharge.        Left eye: No discharge.     Extraocular Movements: EOM normal.     Conjunctiva/sclera: Conjunctivae normal.     Right eye: Right conjunctiva is not injected. No chemosis.    Left eye: Left conjunctiva is not injected. No chemosis.    Pupils: Pupils are equal, round, and reactive to light.  Cardiovascular:     Rate and Rhythm: Normal rate and regular rhythm.      Heart sounds: Normal heart sounds.  Pulmonary:     Effort: Pulmonary effort is normal. No tachypnea, accessory muscle usage or respiratory distress.     Breath sounds: Normal breath sounds. No wheezing, rhonchi or rales.     Comments: Moving air well in all lung fields. No increased work of breathing.  Chest:     Chest wall: No tenderness.  Lymphadenopathy:     Cervical: No cervical adenopathy.  Skin:    Coloration: Skin is not pale.     Findings: No abrasion, erythema, petechiae or rash. Rash is not papular, urticarial or vesicular.  Neurological:     Mental Status: She is alert.  Psychiatric:        Mood and Affect: Mood and affect normal.        Behavior: Behavior is cooperative.      Diagnostic studies: none (recent pulmonary function testing in October was within normal limits)       Salvatore Marvel, MD  [facility]

## 2020-12-25 ENCOUNTER — Other Ambulatory Visit: Payer: Self-pay | Admitting: Allergy & Immunology

## 2020-12-25 ENCOUNTER — Encounter: Payer: Self-pay | Admitting: Allergy & Immunology

## 2020-12-25 DIAGNOSIS — J4540 Moderate persistent asthma, uncomplicated: Secondary | ICD-10-CM | POA: Insufficient documentation

## 2020-12-25 DIAGNOSIS — L508 Other urticaria: Secondary | ICD-10-CM | POA: Insufficient documentation

## 2020-12-25 DIAGNOSIS — U099 Post covid-19 condition, unspecified: Secondary | ICD-10-CM | POA: Insufficient documentation

## 2020-12-25 MED ORDER — CETIRIZINE HCL 10 MG PO TABS: 10.0000 mg | ORAL_TABLET | Freq: Every evening | ORAL | 5 refills | Status: DC

## 2020-12-25 MED ORDER — ALBUTEROL SULFATE HFA 108 (90 BASE) MCG/ACT IN AERS: 2.0000 | INHALATION_SPRAY | RESPIRATORY_TRACT | 1 refills | Status: DC | PRN

## 2020-12-25 MED ORDER — FLUTICASONE PROPIONATE HFA 110 MCG/ACT IN AERO
2.0000 | INHALATION_SPRAY | Freq: Two times a day (BID) | RESPIRATORY_TRACT | 5 refills | Status: DC
Start: 2020-12-25 — End: 2020-12-25

## 2020-12-25 MED ORDER — ZAFIRLUKAST 10 MG PO TABS: 10.0000 mg | ORAL_TABLET | Freq: Two times a day (BID) | ORAL | 5 refills | Status: DC

## 2020-12-25 MED FILL — CETIRIZINE HCL 10 MG TABS: 10 | 100 days supply | Qty: 100 | Fill #0

## 2020-12-25 MED FILL — ALBUTEROL SULFATE HFA 108 (: 108 (90 BAS | 16 days supply | Qty: 18 | Fill #0

## 2020-12-31 ENCOUNTER — Other Ambulatory Visit: Payer: Self-pay

## 2020-12-31 ENCOUNTER — Ambulatory Visit (INDEPENDENT_AMBULATORY_CARE_PROVIDER_SITE_OTHER): Payer: 59 | Admitting: *Deleted

## 2020-12-31 DIAGNOSIS — L501 Idiopathic urticaria: Secondary | ICD-10-CM | POA: Diagnosis not present

## 2020-12-31 DIAGNOSIS — L508 Other urticaria: Secondary | ICD-10-CM

## 2021-01-08 ENCOUNTER — Other Ambulatory Visit (HOSPITAL_COMMUNITY): Payer: Self-pay | Admitting: Family Medicine

## 2021-01-08 DIAGNOSIS — M5481 Occipital neuralgia: Secondary | ICD-10-CM | POA: Diagnosis not present

## 2021-01-08 DIAGNOSIS — M542 Cervicalgia: Secondary | ICD-10-CM | POA: Diagnosis not present

## 2021-01-08 DIAGNOSIS — M791 Myalgia, unspecified site: Secondary | ICD-10-CM | POA: Diagnosis not present

## 2021-01-09 ENCOUNTER — Telehealth: Payer: Self-pay | Admitting: *Deleted

## 2021-01-09 NOTE — Telephone Encounter (Signed)
Called patient to inform her that pharmacy delivered her Aimovig to our office instead of her home.  She does get her Kathreen Devoid delivered here.  Patient will call [facility] and call me back.

## 2021-01-10 DIAGNOSIS — K912 Postsurgical malabsorption, not elsewhere classified: Secondary | ICD-10-CM | POA: Diagnosis not present

## 2021-01-10 DIAGNOSIS — Z9884 Bariatric surgery status: Secondary | ICD-10-CM | POA: Diagnosis not present

## 2021-01-10 NOTE — Telephone Encounter (Signed)
Patient spoke with [place] and someone from pharmacy will pick up box and medication and will get it delivered to the patient.  Medication was picked up by manager from [employer] on 01/09/21.

## 2021-01-13 ENCOUNTER — Other Ambulatory Visit (HOSPITAL_COMMUNITY): Payer: Self-pay | Admitting: Internal Medicine

## 2021-01-13 MED FILL — VIT D2 1.25 MG (50,000 UNIT: 1.25 MG | 84 days supply | Qty: 12 | Fill #2

## 2021-01-13 MED FILL — CloNIDine HCL 0.1 MG TAB: 0.1 | 90 days supply | Qty: 180 | Fill #2

## 2021-01-13 MED FILL — LEVOTHYROXINE 50 MCG TABLET: 50 | 90 days supply | Qty: 90 | Fill #0

## 2021-01-14 ENCOUNTER — Encounter: Payer: Self-pay | Admitting: *Deleted

## 2021-01-14 ENCOUNTER — Other Ambulatory Visit: Payer: Self-pay

## 2021-01-14 ENCOUNTER — Ambulatory Visit (INDEPENDENT_AMBULATORY_CARE_PROVIDER_SITE_OTHER): Payer: 59 | Admitting: Pulmonary Disease

## 2021-01-14 ENCOUNTER — Encounter: Payer: Self-pay | Admitting: Allergy & Immunology

## 2021-01-14 ENCOUNTER — Encounter: Payer: Self-pay | Admitting: Pulmonary Disease

## 2021-01-14 VITALS — BP 118/80 | HR 69 | Temp 97.8°F | Ht 65.0 in | Wt 179.6 lb

## 2021-01-14 DIAGNOSIS — J45909 Unspecified asthma, uncomplicated: Secondary | ICD-10-CM

## 2021-01-14 DIAGNOSIS — R0602 Shortness of breath: Secondary | ICD-10-CM

## 2021-01-14 DIAGNOSIS — J45990 Exercise induced bronchospasm: Secondary | ICD-10-CM | POA: Diagnosis not present

## 2021-01-14 LAB — D-DIMER, QUANTITATIVE: D-Dimer, Quant: 0.3 mcg/mL FEU (ref <0.50)

## 2021-01-14 NOTE — Progress Notes (Signed)
Rachel Fry    [id number]    11-15-1955  Primary Care Physician:Shelton, Joelene Millin, MD  Referring Physician: [name], [address] [place],  [place] [id number]  Chief complaint: Follow-up for dyspnea, post COVID-58  HPI: 65 year old with history of asthma, idiopathic urticaria, hypertension, hyperlipidemia, allergies Diagnosed with COVID-19 in September 2020.  She did not require hospitalization.  Treated with antibiotics, steroids Continues to have significant dyspnea since then.  She is symptomatic with sitting and with exertion, occasional wheeze.  Denies any cough. She was given a sample of breztri by her primary care but had to stop due to hoarseness of voice.  She did not notice any difference with the inhaler  Follows with Dr. 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  No mold, hot tub, Jacuzzi.  No feather pillows or comforters Smoking history: Never smoker Travel history: No significant travel history Relevant family history: Brother had asthma  Interim history: She has finished rehab at [facility] to have dyspnea on exertion which is unchanged from baseline She states that her 6-minute walk test was actually worse at the end of the rehab than before  Continues on Xolair, Flovent   Outpatient Encounter Medications as of 01/14/2021  Medication Sig  . albuterol (VENTOLIN HFA) 108 (90 Base) MCG/ACT inhaler Inhale 2 puffs into the lungs every 4 (four) hours as needed for wheezing or shortness of breath.  Marland Kitchen atorvastatin (LIPITOR) 20 MG tablet TAKE 1 TABLET (20 MG TOTAL) BY MOUTH DAILY. (Patient taking differently: Take 20 mg by mouth at bedtime.)  . b complex vitamins capsule Take 1 capsule by mouth daily.  . Bacillus Coagulans-Inulin (PROBIOTIC)  1-250 BILLION-MG CAPS Probiotic  Take 1 cap po daily  . botulinum toxin Type A (BOTOX) 100 UNITS SOLR injection Botox - Historical Medication  once every 12 weeks for migraines Active  . cetirizine (ZYRTEC) 10 MG tablet Take 1 tablet (10 mg total) by mouth every evening.  . cloNIDine (CATAPRES) 0.1 MG tablet Take 1 tablet (0.1 mg total) by mouth 2 (two) times daily.  Marland Kitchen EPINEPHrine 0.3 mg/0.3 mL IJ SOAJ injection Inject 0.3 mg into the muscle as needed for anaphylaxis.   Eduard Roux (AIMOVIG, 140 MG DOSE, [place]) Inject into the skin every 30 (thirty) days.  Marland Kitchen ezetimibe (ZETIA) 10 MG tablet Take 10 mg by mouth every evening.   . famotidine (PEPCID) 20 MG tablet Take 20 mg by mouth 2 (two) times daily.  . fluticasone (FLOVENT HFA) 110 MCG/ACT inhaler Inhale 2 puffs into the lungs 2 (two) times daily.  . frovatriptan (FROVA) 2.5 MG tablet Take 2.5 mg by mouth 2 (two) times daily as needed for migraine. Max of 3 doses per 7 days  . levothyroxine (SYNTHROID, LEVOTHROID) 50 MCG tablet Take 50 mcg by mouth daily before breakfast.   . Menaquinone-7 (VITAMIN K2 PO) Take by mouth.  Marland Kitchen omalizumab (XOLAIR) 150 MG injection Inject 300 mg into the skin every 28 (twenty-eight) days.  Vladimir Faster Glycol-Propyl Glycol (SYSTANE OP) Place 1-2 drops into both eyes 2 (two) times daily.  . Quercetin 250 MG TABS   . Rimegepant Sulfate (NURTEC) 75 MG TBDP Take 75 mg by mouth daily as needed (migraines). Max 15 doses per 30 days  . rizatriptan (MAXALT) 10 MG tablet Take  10 mg by mouth 2 (two) times daily as needed for migraine. Max doses of 3 doses per 7 days  . spironolactone (ALDACTONE) 25 MG tablet Take 50 mg by mouth 2 (two) times daily.  Marland Kitchen torsemide (DEMADEX) 20 MG tablet Take 40 mg by mouth daily as needed.  . triamcinolone (NASACORT) 55 MCG/ACT AERO nasal inhaler Place 2 sprays into the nose at bedtime.  Marland Kitchen VASCEPA 1 g capsule Take 2 g by mouth 2 (two) times daily.  . Vitamin D, Ergocalciferol, (DRISDOL) 1.25 MG  (50000 UT) CAPS capsule Take 50,000 Units by mouth every Sunday.   . zafirlukast (ACCOLATE) 10 MG tablet Take 1 tablet (10 mg total) by mouth 2 (two) times daily.  . [DISCONTINUED] carbamazepine (TEGRETOL) 200 MG tablet SMARTSIG:1 Tablet(s) By Mouth Every 12 Hours (Patient not taking: Reported on 11/28/2020)  . [DISCONTINUED] Erenumab-aooe (AIMOVIG) 70 MG/ML SOAJ Inject 70 mg into the skin every 30 (thirty) days.   . [DISCONTINUED] furosemide (LASIX) 20 MG tablet Take 40 mg by mouth daily as needed for edema.  (Patient not taking: Reported on 11/28/2020)  . [DISCONTINUED] NALTREXONE HCL PO Take 1.5 mg by mouth daily.  . [DISCONTINUED] predniSONE (DELTASONE) 5 MG tablet Take 5 mg by mouth daily.   Facility-Administered Encounter Medications as of 01/14/2021  Medication  . omalizumab Arvid Right) injection 300 mg    Physical Exam: Blood pressure 118/80, pulse 69, temperature 97.8 F (36.6 C), temperature source Temporal, height 5\' 5"  (1.651 m), weight 179 lb 9.6 oz (81.5 kg), SpO2 98 %. Gen:      No acute distress HEENT:  EOMI, sclera anicteric Neck:     No masses; no thyromegaly Lungs:    Clear to auscultation bilaterally; normal respiratory effort CV:         Regular rate and rhythm; no murmurs Abd:      + bowel sounds; soft, non-tender; no palpable masses, no distension Ext:    No edema; adequate peripheral perfusion Skin:      Warm and dry; no rash Neuro: alert and oriented x 3 Psych: normal mood and affect  Data Reviewed: Imaging: Chest x-ray 02/14/2020-no acute cardiopulmonary abnormality CT renal stone 04/19/2020-visualized lung bases are clear High-res CT 08/09/2020-no evidence of interstitial lung disease, small hiatal hernia, T10 vertebral compression fracture. I have reviewed the images personally.  PFTs: 08/13/2020 FVC 2.70 [76%), FEV1 1.85 [60%], F/F 68, TLC 5.02 [91%], DLCO 23.87 [108%] Moderate obstruction   Labs: CBC 08/16/2020-WBC 10.3, eos 0.2%, absolute eosinophil count  21 IgE 08/16/2020 1557 Alpha-1 antitrypsin 08/16/2020-126, PI MM  Assessment: Post COVID-19, long-haul syndrome Continues to have significant dyspnea, loss of taste, mild brain fog  So far her work-up has been normal with unremarkable echo, high-res CT with no interstitial lung disease PFTs show obstruction but no restriction or diffusion impairment  She has finished pulmonary rehab Given persistent symptoms I will check a D-dimer and schedule cardiopulmonary exercise test  Asthma, chronic idiopathic urticaria thought to be secondary to COVID-19 On Xolair Currently on Flovent and Nasacort She has not tolerated breztri and other inhalers with LABA in the past due to palpitations. Continue monitoring  Plan/Recommendations: Continue Flovent and Nasacort D-dimer, cardiopulmonary exercise test  Marshell Garfinkel MD [facility] 01/14/2021, 9:18 AM  CC: [name], MD

## 2021-01-14 NOTE — Addendum Note (Signed)
Addended by: Suzzanne Cloud E on: 01/14/2021 09:37 AM   Modules accepted: Orders

## 2021-01-14 NOTE — Patient Instructions (Signed)
We will check a D-dimer today to make sure there are no clots to explain dyspnea We will schedule cardiopulmonary exercise test for asthma, exercise-induced bronchospasm  Follow-up in 1 to 2 months

## 2021-01-16 MED FILL — FLOVENT HFA 110 MCG INHALER: 110 | 30 days supply | Qty: 12 | Fill #0

## 2021-01-17 ENCOUNTER — Other Ambulatory Visit (HOSPITAL_COMMUNITY): Payer: Self-pay

## 2021-01-17 MED ORDER — PREDNISONE 10 MG PO TABS: ORAL_TABLET | ORAL | 0 refills | Status: DC

## 2021-01-20 MED FILL — BOTOX 200 UNITS VIAL: 200 | 90 days supply | Qty: 1 | Fill #3

## 2021-01-22 MED FILL — XOLAIR 150 MG SOLR: 150 | 28 days supply | Qty: 2 | Fill #7

## 2021-01-24 ENCOUNTER — Other Ambulatory Visit: Payer: Self-pay | Admitting: *Deleted

## 2021-01-24 MED ORDER — PREDNISONE 10 MG PO TABS: 5.0000 mg | ORAL_TABLET | ORAL | 0 refills | Status: AC

## 2021-01-24 MED ORDER — PREDNISONE 5 MG PO TABS: ORAL_TABLET | ORAL | 0 refills | Status: DC

## 2021-01-26 ENCOUNTER — Other Ambulatory Visit (HOSPITAL_COMMUNITY): Payer: Self-pay

## 2021-01-27 ENCOUNTER — Other Ambulatory Visit: Payer: Self-pay

## 2021-01-27 ENCOUNTER — Ambulatory Visit (INDEPENDENT_AMBULATORY_CARE_PROVIDER_SITE_OTHER): Payer: 59

## 2021-01-27 DIAGNOSIS — L501 Idiopathic urticaria: Secondary | ICD-10-CM

## 2021-01-27 DIAGNOSIS — L508 Other urticaria: Secondary | ICD-10-CM

## 2021-01-27 NOTE — Telephone Encounter (Signed)
Did you score our tablets in half? That would be the only way to get 5 mg every other day since her tablets are 10 mg.  I figured we would just send in a prescription.  We could try 10 mg every other day if that makes it easier.  Salvatore Marvel, MD [facility]

## 2021-01-29 DIAGNOSIS — H35373 Puckering of macula, bilateral: Secondary | ICD-10-CM | POA: Diagnosis not present

## 2021-01-30 ENCOUNTER — Other Ambulatory Visit (HOSPITAL_COMMUNITY): Payer: Self-pay

## 2021-01-30 DIAGNOSIS — G43719 Chronic migraine without aura, intractable, without status migrainosus: Secondary | ICD-10-CM | POA: Diagnosis not present

## 2021-01-30 MED FILL — Rimegepant Sulfate Tab Disint 75 MG: ORAL | 30 days supply | Qty: 8 | Fill #0 | Status: AC

## 2021-01-30 MED FILL — Rimegepant Sulfate Tab Disint 75 MG: ORAL | 30 days supply | Qty: 8 | Fill #0 | Status: CN

## 2021-01-31 ENCOUNTER — Other Ambulatory Visit (HOSPITAL_COMMUNITY): Payer: Self-pay

## 2021-02-01 ENCOUNTER — Other Ambulatory Visit (HOSPITAL_COMMUNITY): Payer: Self-pay

## 2021-02-04 ENCOUNTER — Encounter (HOSPITAL_COMMUNITY): Payer: 59

## 2021-02-06 ENCOUNTER — Other Ambulatory Visit: Payer: Self-pay

## 2021-02-06 ENCOUNTER — Ambulatory Visit (HOSPITAL_COMMUNITY): Payer: 59 | Attending: Pulmonary Disease

## 2021-02-06 DIAGNOSIS — J45909 Unspecified asthma, uncomplicated: Secondary | ICD-10-CM | POA: Diagnosis not present

## 2021-02-06 DIAGNOSIS — J45990 Exercise induced bronchospasm: Secondary | ICD-10-CM | POA: Insufficient documentation

## 2021-02-10 ENCOUNTER — Other Ambulatory Visit (HOSPITAL_COMMUNITY): Payer: Self-pay

## 2021-02-10 MED FILL — Erenumab-aooe Subcutaneous Soln Auto-Injector 140 MG/ML: SUBCUTANEOUS | 28 days supply | Qty: 1 | Fill #0 | Status: CN

## 2021-02-11 ENCOUNTER — Other Ambulatory Visit (HOSPITAL_COMMUNITY): Payer: Self-pay

## 2021-02-11 ENCOUNTER — Encounter (HOSPITAL_COMMUNITY): Payer: 59

## 2021-02-11 MED FILL — Erenumab-aooe Subcutaneous Soln Auto-Injector 140 MG/ML: SUBCUTANEOUS | 28 days supply | Qty: 1 | Fill #0 | Status: CN

## 2021-02-13 ENCOUNTER — Other Ambulatory Visit (HOSPITAL_COMMUNITY): Payer: Self-pay

## 2021-02-13 MED FILL — Fluticasone Propionate HFA Inhal Aer 110 MCG/ACT: RESPIRATORY_TRACT | 30 days supply | Qty: 12 | Fill #0 | Status: AC

## 2021-02-13 MED FILL — Omalizumab For Inj 150 MG: SUBCUTANEOUS | 28 days supply | Qty: 2 | Fill #0 | Status: AC

## 2021-02-14 ENCOUNTER — Encounter: Payer: Self-pay | Admitting: Pulmonary Disease

## 2021-02-14 ENCOUNTER — Other Ambulatory Visit (HOSPITAL_COMMUNITY): Payer: Self-pay

## 2021-02-14 ENCOUNTER — Ambulatory Visit (INDEPENDENT_AMBULATORY_CARE_PROVIDER_SITE_OTHER): Payer: 59 | Admitting: Pulmonary Disease

## 2021-02-14 ENCOUNTER — Other Ambulatory Visit: Payer: Self-pay

## 2021-02-14 VITALS — BP 134/78 | HR 88 | Temp 97.8°F | Ht 65.0 in | Wt 179.2 lb

## 2021-02-14 DIAGNOSIS — Z8616 Personal history of COVID-19: Secondary | ICD-10-CM

## 2021-02-14 DIAGNOSIS — J45909 Unspecified asthma, uncomplicated: Secondary | ICD-10-CM | POA: Diagnosis not present

## 2021-02-14 DIAGNOSIS — R0600 Dyspnea, unspecified: Secondary | ICD-10-CM

## 2021-02-14 DIAGNOSIS — R0609 Other forms of dyspnea: Secondary | ICD-10-CM

## 2021-02-14 MED ORDER — BUDESONIDE-FORMOTEROL FUMARATE 160-4.5 MCG/ACT IN AERO
2.0000 | INHALATION_SPRAY | Freq: Two times a day (BID) | RESPIRATORY_TRACT | 5 refills | Status: DC
  Filled 2021-02-14: qty 10.2, 30d supply, fill #0

## 2021-02-14 NOTE — Patient Instructions (Signed)
We will stop the Flovent and start you on Symbicort 160/4.5 two puffs twice daily  I will also send a message to Dr. Rockey Situ to see if he can reevaluate you.  Please call to make an appointment with him  Follow-up in 6 months.

## 2021-02-14 NOTE — Progress Notes (Signed)
Rachel Fry    [id number]    Jan 26, 1956  Primary Care Physician:Shelton, Joelene Millin, MD  Referring Physician: [name], [place],  [place] [address]  Chief complaint: Follow-up for dyspnea, post COVID-75  HPI: 65 year old with history of asthma, idiopathic urticaria, hypertension, hyperlipidemia, allergies Diagnosed with COVID-19 in September 2020.  She did not require hospitalization.  Treated with antibiotics, steroids Continues to have significant dyspnea since then.  She is symptomatic with sitting and with exertion, occasional wheeze.  Denies any cough. She was given a sample of breztri by her primary care but had to stop due to hoarseness of voice.  She did not notice any difference with the inhaler  Follows with Dr. Ernst Bowler for asthma, idiopathic utricaria [on biopsy] and recently started on Xolair She is also on Flovent and Nasacort  Finished pulmonary rehab at [facility] in early 2022 but did not improve her dyspnea.  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  No mold, hot tub, Jacuzzi.  No feather pillows or comforters Smoking history: Never smoker Travel history: No significant travel history Relevant family history: Brother had asthma  Interim history: Continues on Xolair, Flovent  Here for review of cardiopulmonary exercise stress.  States that breathing is unchanged with dyspnea, intermittent chest tightness on exertion.   Outpatient Encounter Medications as of 02/14/2021  Medication Sig  . albuterol (VENTOLIN HFA) 108 (90 Base) MCG/ACT inhaler INHALE 2 PUFFS INTO THE LUNGS EVERY 4 (FOUR) HOURS AS NEEDED FOR WHEEZING OR SHORTNESS OF BREATH.  Marland Kitchen atorvastatin (LIPITOR) 20 MG tablet TAKE 1 TABLET (20 MG TOTAL) BY MOUTH DAILY. (Patient taking differently: Take 20 mg by mouth  at bedtime.)  . b complex vitamins capsule Take 1 capsule by mouth daily.  . Bacillus Coagulans-Inulin (PROBIOTIC) 1-250 BILLION-MG CAPS Probiotic  Take 1 cap po daily  . botulinum toxin Type A (BOTOX) 100 UNITS SOLR injection Botox - Historical Medication  once every 12 weeks for migraines Active  . Botulinum Toxin Type A 200 units SOLR INJECT 155-200 UNITS INTO THE MUSCLE EVERY 3 MONTHS PER PREEMPT PROTOCOL, TO BE ADMINISTERED BY MD.  . carbamazepine (TEGRETOL) 200 MG tablet TAKE 1 TABLET BY MOUTH EVERY 12 HOURS  . cetirizine (ZYRTEC) 10 MG tablet TAKE 1 TABLET (10 MG TOTAL) BY MOUTH EVERY EVENING.  . cloNIDine (CATAPRES) 0.1 MG tablet Take 1 tablet (0.1 mg total) by mouth 2 (two) times daily.  Marland Kitchen EPINEPHrine 0.3 mg/0.3 mL IJ SOAJ injection Inject 0.3 mg into the muscle as needed for anaphylaxis.   Eduard Roux (AIMOVIG, 140 MG DOSE, [place]) Inject into the skin every 30 (thirty) days.  Marland Kitchen ezetimibe (ZETIA) 10 MG tablet Take 10 mg by mouth every evening.   . famotidine (PEPCID) 20 MG tablet Take 20 mg by mouth 2 (two) times daily.  . fluticasone (FLOVENT HFA) 110 MCG/ACT inhaler INHALE 2 PUFFS INTO THE LUNGS 2 (TWO) TIMES DAILY.  . frovatriptan (FROVA) 2.5 MG tablet Take 2.5 mg by mouth 2 (two) times daily as needed for migraine. Max of 3 doses per 7 days  . frovatriptan (FROVA) 2.5 MG tablet TAKE 1 TABLET BY MOUTH AS DIRECTED (DO NOT EXCEED 3 TABLETS PER 24 HOURS)  . icosapent Ethyl (VASCEPA) 1 g capsule TAKE 2 CAPSULES TWICE A DAY BY MOUTH.  Marland Kitchen  levothyroxine (SYNTHROID, LEVOTHROID) 50 MCG tablet Take 50 mcg by mouth daily before breakfast.   . Menaquinone-7 (VITAMIN K2 PO) Take by mouth.  Marland Kitchen omalizumab (XOLAIR) 150 MG injection INJECT 300 MG INTO THE SKIN EVERY 28 (TWENTY-EIGHT) DAYS.  Vladimir Faster Glycol-Propyl Glycol (SYSTANE OP) Place 1-2 drops into both eyes 2 (two) times daily.  . Rimegepant Sulfate 75 MG TBDP DISSOLVE 1 TABLET BY MOUTH ONCE A DAY AS NEEDED FOR MIGRAINE  . rizatriptan  (MAXALT) 10 MG tablet Take 10 mg by mouth 2 (two) times daily as needed for migraine. Max doses of 3 doses per 7 days  . spironolactone (ALDACTONE) 25 MG tablet Take 50 mg by mouth 2 (two) times daily.  Marland Kitchen torsemide (DEMADEX) 20 MG tablet TAKE 2 TABLETS BY MOUTH DAILY  . triamcinolone (NASACORT) 55 MCG/ACT AERO nasal inhaler Place 2 sprays into the nose at bedtime.  Marland Kitchen VASCEPA 1 g capsule Take 2 g by mouth 2 (two) times daily.  . Vitamin D, Ergocalciferol, (DRISDOL) 1.25 MG (50000 UNIT) CAPS capsule TAKE 1 CAPSULE BY MOUTH EVERY WEEK  . zafirlukast (ACCOLATE) 10 MG tablet TAKE 1 TABLET BY MOUTH 2 TIMES DAILY  . [DISCONTINUED] atorvastatin (LIPITOR) 20 MG tablet TAKE 1 TABLET BY MOUTH ONCE DAILY  . [DISCONTINUED] cloNIDine (CATAPRES) 0.1 MG tablet TAKE 1 TABLET BY MOUTH TWICE DAILY  . [DISCONTINUED] Erenumab-aooe 140 MG/ML SOAJ INJECT 1 PEN UNDER THE SKIN ONCE A MONTH  . [DISCONTINUED] Erenumab-aooe 140 MG/ML SOAJ INJECT 1 PEN INTO THE SKIN EVERY 30 DAYS  . [DISCONTINUED] ezetimibe (ZETIA) 10 MG tablet TAKE 1 TABLET BY MOUTH ONCE DAILY  . [DISCONTINUED] frovatriptan (FROVA) 2.5 MG tablet TAKE 1 TABLET BY MOUTH AS DIRECTED (DO NOT EXCEED 3 TABLETS PER 24 HOURS)  . [DISCONTINUED] levothyroxine (SYNTHROID) 50 MCG tablet TAKE 1 TABLET BY MOUTH ONCE DAILY  . [DISCONTINUED] predniSONE (DELTASONE) 10 MG tablet Take two tablets (20mg ) twice daily for three days, then one tablet (10mg ) twice daily for three days, then STOP.  . [DISCONTINUED] predniSONE (DELTASONE) 5 MG tablet Take 1 tab every other day for 1 month.  . [DISCONTINUED] predniSONE (DELTASONE) 5 MG tablet TAKE 1 TABLET BY MOUTH ONCE DAILY  . [DISCONTINUED] Rimegepant Sulfate (NURTEC) 75 MG TBDP Take 75 mg by mouth daily as needed (migraines). Max 15 doses per 30 days  . [DISCONTINUED] spironolactone (ALDACTONE) 50 MG tablet TAKE 1 TABLET TWICE A DAY BY MOUTH.  . [DISCONTINUED] torsemide (DEMADEX) 20 MG tablet Take 40 mg by mouth daily as needed.   . [DISCONTINUED] Vitamin D, Ergocalciferol, (DRISDOL) 1.25 MG (50000 UT) CAPS capsule Take 50,000 Units by mouth every Sunday.   . predniSONE (DELTASONE) 10 MG tablet Take 0.5 tablets (5 mg total) by mouth every other day.  . predniSONE (DELTASONE) 5 MG tablet TAKE 3 TABLETS BY MOUTH DAILY.  Marland Kitchen Quercetin 250 MG TABS   . zafirlukast (ACCOLATE) 10 MG tablet TAKE 1 TABLET BY MOUTH TWICE DAILY   Facility-Administered Encounter Medications as of 02/14/2021  Medication  . omalizumab Arvid Right) injection 300 mg    Physical Exam: Blood pressure 134/78, pulse 88, temperature 97.8 F (36.6 C), temperature source Temporal, height 5\' 5"  (1.651 m), weight 179 lb 3.2 oz (81.3 kg), SpO2 93 %. Gen:      No acute distress HEENT:  EOMI, sclera anicteric Neck:     No masses; no thyromegaly Lungs:    Clear to auscultation bilaterally; normal respiratory effort CV:         Regular  rate and rhythm; no murmurs Abd:      + bowel sounds; soft, non-tender; no palpable masses, no distension Ext:    No edema; adequate peripheral perfusion Skin:      Warm and dry; no rash Neuro: alert and oriented x 3 Psych: normal mood and affect  Data Reviewed: Imaging: Chest x-ray 02/14/2020-no acute cardiopulmonary abnormality CT renal stone 04/19/2020-visualized lung bases are clear High-res CT 08/09/2020-no evidence of interstitial lung disease, small hiatal hernia, T10 vertebral compression fracture. I have reviewed the images personally.  PFTs: 08/13/2020 FVC 2.70 [76%), FEV1 1.85 [60%], F/F 68, TLC 5.02 [91%], DLCO 23.87 [108%] Moderate obstruction   Labs: CBC 08/16/2020-WBC 10.3, eos 0.2%, absolute eosinophil count 21 IgE 08/16/2020 1557 Alpha-1 antitrypsin 08/16/2020-126, PI MM  D-dimer 01/14/2021- 0.30  Assessment: Post COVID-19, long-haul syndrome Continues to have significant dyspnea, loss of taste, mild brain fog  So far her work-up has been normal with unremarkable echo, high-res CT with no interstitial  lung disease, negative D-dimer PFTs show obstruction but no restriction or diffusion impairment  She has finished pulmonary rehab but continues to be symptomatic Cardiopulmonary exercise test shows normal exercise capacity with no evidence of exercise-induced bronchospasm. I note that she has mild ST depressions 0.9 mm which do not meet the 1 mm criteria to be significant but given persistent symptoms I will refer her back to Dr. Rockey Situ, cardiology who has already seen her her for reevaluation  Asthma, chronic idiopathic urticaria thought to be secondary to COVID-19 On Xolair Currently on Flovent and Nasacort She has not tolerated breztri and other inhalers with LABA in the past due to hoarseness, intermittent palpitations  We will make another attempt to try Symbicort and see if this helps with her breathing Continue monitoring  Plan/Recommendations: Stop Flovent, start [facility] reevaluation  Marshell Garfinkel MD [facility] 02/14/2021, 2:23 PM  CC: [name], MD

## 2021-02-17 ENCOUNTER — Other Ambulatory Visit (HOSPITAL_COMMUNITY): Payer: Self-pay

## 2021-02-17 MED ORDER — AIMOVIG 140 MG/ML ~~LOC~~ SOAJ
SUBCUTANEOUS | 3 refills | Status: DC
  Filled 2021-02-17 – 2021-02-21 (×5): qty 1, 30d supply, fill #0
  Filled 2021-02-25: qty 1, 28d supply, fill #0
  Filled 2021-03-26: qty 1, 28d supply, fill #1
  Filled 2021-04-24: qty 1, 28d supply, fill #2
  Filled 2021-05-14: qty 1, 28d supply, fill #3

## 2021-02-18 ENCOUNTER — Other Ambulatory Visit (HOSPITAL_COMMUNITY): Payer: Self-pay

## 2021-02-18 MED FILL — Zafirlukast Tab 10 MG: ORAL | 30 days supply | Qty: 60 | Fill #0 | Status: AC

## 2021-02-19 ENCOUNTER — Other Ambulatory Visit (HOSPITAL_COMMUNITY): Payer: Self-pay

## 2021-02-20 ENCOUNTER — Other Ambulatory Visit (HOSPITAL_COMMUNITY): Payer: Self-pay

## 2021-02-21 ENCOUNTER — Other Ambulatory Visit (HOSPITAL_COMMUNITY): Payer: Self-pay

## 2021-02-21 NOTE — Telephone Encounter (Signed)
Can hold Symbicort for a few days until recovery of voice and then resume with spacer If symptoms recur then stop the medication

## 2021-02-21 NOTE — Telephone Encounter (Signed)
I started taking Symbicort 3 days ago. I am now hoarse. I am rinsing after use and did start using a spacer last night. Any suggestions? Thanks,  Rachel Fry   Dr. Vaughan Browner, please see mychart message and advise. Thanks!

## 2021-02-25 ENCOUNTER — Other Ambulatory Visit: Payer: Self-pay

## 2021-02-25 ENCOUNTER — Other Ambulatory Visit (HOSPITAL_COMMUNITY): Payer: Self-pay

## 2021-02-25 ENCOUNTER — Ambulatory Visit (INDEPENDENT_AMBULATORY_CARE_PROVIDER_SITE_OTHER): Payer: 59 | Admitting: *Deleted

## 2021-02-25 DIAGNOSIS — L508 Other urticaria: Secondary | ICD-10-CM

## 2021-02-25 DIAGNOSIS — L501 Idiopathic urticaria: Secondary | ICD-10-CM

## 2021-02-27 DIAGNOSIS — M791 Myalgia, unspecified site: Secondary | ICD-10-CM | POA: Diagnosis not present

## 2021-02-27 DIAGNOSIS — M542 Cervicalgia: Secondary | ICD-10-CM | POA: Diagnosis not present

## 2021-02-27 DIAGNOSIS — M5481 Occipital neuralgia: Secondary | ICD-10-CM | POA: Diagnosis not present

## 2021-02-28 DIAGNOSIS — M818 Other osteoporosis without current pathological fracture: Secondary | ICD-10-CM | POA: Diagnosis not present

## 2021-03-03 ENCOUNTER — Other Ambulatory Visit (HOSPITAL_COMMUNITY): Payer: Self-pay

## 2021-03-03 MED ORDER — SPIRONOLACTONE 50 MG PO TABS
ORAL_TABLET | ORAL | 2 refills | Status: DC
  Filled 2021-03-03: qty 180, 90d supply, fill #0
  Filled 2021-05-26: qty 180, 90d supply, fill #1
  Filled 2021-09-05: qty 180, 90d supply, fill #2

## 2021-03-03 MED ORDER — ATORVASTATIN CALCIUM 20 MG PO TABS
1.0000 | ORAL_TABLET | Freq: Every day | ORAL | 3 refills | Status: DC
  Filled 2021-03-03: qty 90, 90d supply, fill #0

## 2021-03-04 ENCOUNTER — Other Ambulatory Visit: Payer: Self-pay

## 2021-03-04 ENCOUNTER — Ambulatory Visit (INDEPENDENT_AMBULATORY_CARE_PROVIDER_SITE_OTHER): Payer: 59 | Admitting: Family

## 2021-03-04 ENCOUNTER — Encounter: Payer: Self-pay | Admitting: Family

## 2021-03-04 ENCOUNTER — Other Ambulatory Visit (HOSPITAL_COMMUNITY): Payer: Self-pay

## 2021-03-04 VITALS — BP 158/92 | HR 81 | Ht 65.0 in | Wt 173.0 lb

## 2021-03-04 DIAGNOSIS — E7849 Other hyperlipidemia: Secondary | ICD-10-CM

## 2021-03-04 DIAGNOSIS — R072 Precordial pain: Secondary | ICD-10-CM

## 2021-03-04 DIAGNOSIS — I700 Atherosclerosis of aorta: Secondary | ICD-10-CM

## 2021-03-04 DIAGNOSIS — R0609 Other forms of dyspnea: Secondary | ICD-10-CM

## 2021-03-04 DIAGNOSIS — R079 Chest pain, unspecified: Secondary | ICD-10-CM | POA: Diagnosis not present

## 2021-03-04 DIAGNOSIS — R0600 Dyspnea, unspecified: Secondary | ICD-10-CM | POA: Diagnosis not present

## 2021-03-04 MED ORDER — METOPROLOL TARTRATE 100 MG PO TABS
ORAL_TABLET | ORAL | 0 refills | Status: DC
  Filled 2021-03-04 – 2021-03-29 (×2): qty 1, 1d supply, fill #0

## 2021-03-04 NOTE — Progress Notes (Addendum)
Office Visit    Patient Name: Rachel JewSusanne P Koch Date of Encounter: 03/04/2021  PCP:  Andi DevonShelton, Kimberly, MD   [facility]  Cardiologist:  Julien Nordmannimothy Gollan, MD  Advanced Practice Provider:  No care team member to display Electrophysiologist:  None   Chief Complaint    Rachel Fry is a 65 y.o. female with a hx of hyperlipidemia, aortic atherosclerosis by CT, asthma, COVID-19, HTN, shortness of breath presents today for chest pain and dyspnea on exertion.   Past Medical History    Past Medical History:  Diagnosis Date  . Allergy   . Asthma   . COVID-19   . History of kidney stones   . Hypothyroidism   . Migraines   . Numbness and tingling    left side of body, occasionally right side   . S/P Botox injection   . Urticaria    Past Surgical History:  Procedure Laterality Date  . ADENOIDECTOMY    . ARM NEUROPLASTY Right 1994 and 1996   for chronic pain  . CESAREAN SECTION    . CYSTOSCOPY W/ RETROGRADES Left 04/23/2020   Procedure: CYSTOSCOPY WITH RETROGRADE PYELOGRAM;  Surgeon: Riki AltesStoioff, Scott C, MD;  Location: [facility];  Service: Urology;  Laterality: Left;  . CYSTOSCOPY W/ RETROGRADES Left 05/13/2020   Procedure: CYSTOSCOPY WITH RETROGRADE PYELOGRAM;  Surgeon: Vanna ScotlandBrandon, Ashley, MD;  Location: [facility];  Service: Urology;  Laterality: Left;  . CYSTOSCOPY WITH STENT PLACEMENT Left 04/23/2020   Procedure: CYSTOSCOPY WITH STENT PLACEMENT;  Surgeon: Riki AltesStoioff, Scott C, MD;  Location: [facility];  Service: Urology;  Laterality: Left;  . CYSTOSCOPY/URETEROSCOPY/HOLMIUM LASER/STENT PLACEMENT Left 05/13/2020   Procedure: CYSTOSCOPY/URETEROSCOPY/LASER/STENT EXCHANGE;  Surgeon: Vanna ScotlandBrandon, Ashley, MD;  Location: [facility];  Service: Urology;  Laterality: Left;  . OVARIAN CYST REMOVAL  1973  . STONE EXTRACTION WITH BASKET  05/13/2020   Procedure: STONE EXTRACTION WITH BASKET;  Surgeon: Vanna ScotlandBrandon, Ashley, MD;  Location: [facility];  Service: Urology;;  . TONSILLECTOMY    .  TONSILLECTOMY AND ADENOIDECTOMY    . TUBAL LIGATION  1988    Allergies  Allergies  Allergen Reactions  . Flonase [Fluticasone Propionate] Other (See Comments)    The scent in flonase causes wheezing   . Latex Itching  . Fish Oil Itching  . Percocet [Oxycodone-Acetaminophen] Other (See Comments)    Reports her pharmacist told her never to take this medication because of her codeine allergy  . Sulfa Antibiotics     abdominal pain  . Codeine Nausea And Vomiting and Rash    Tolerates tramadol     History of Present Illness    Rachel JewSusanne P Dicocco is a 65 y.o. female with a hx of hyperlipidemia, aortic atherosclerosis by CT, asthma, COVID-19, HTN, shortness of breath last seen 02/12/2020 by Dr. Mariah MillingGollan.  She was seen in consult by Dr. Mariah MillingGollan last year for shortness of breath since having COVID 06/2019.  She did not require hospitalization and was not treated with Remdesivir.  She was treated with antibiotics and steroids.  Please review she had a CT in 2015 with minimal aortic atherosclerosis.  Since COVID she noted muscle and joint pain, headache, rash, loss of taste and smell, intermittent chest pain.She was recommended for echocardiogram to rule out structural heart disease.  Echocardiogram 02/14/2020 LVEF 60 to 65%, no wall motion abnormalities, indeterminate diastolic parameters, RV normal size and function, mild aortic valve sclerosis without stenosis.  She follows with Dr. Isaiah SergeMannam of pulmonology.  She was last seen  02/14/2021 and noted continued symptomatic dyspnea.  She completed pulmonary rehab at [facility] with no improvement in dyspnea.  She has had echo, high-resolution CT with no acute findings, negative D-dimer, PFT with obstruction but no restriction or diffusion impairment.  Her cardiopulmonary exercise test ordered by Dr. Vaughan Browner performed 01/2021 with normal exercise capacity and no evidence of exercise-induced bronchospasm.  By history review she had mild ST depression 0.9 mm but given  persistent symptoms was referred back to our office for follow-up.  She presents today for follow-up. Tells me she has had chest pain since COVID 06/2019. She notes chest discomfort hat occurs most often with activity. Frequency seems to be overall increasing.  Tells me the chest discomfort is midsternal and radiates to both sides. Tells me it is "a little of everything" tightness, shortness.  Relieved by rest. Dyspnea on exertion with as little activity as walking to the door. No wheeze, cough. Started new inhaler last week per pulmonology.   Notes her generalized myalgias have been overall the same over the last year. She remains on Atorvastatin 20mg  daily.   She uses Torsemide as needed which most recently has not needed as often. Tells me she was on Prednisone daily for 3 years and recently stopped and has not been retaining nearly as much fluid.  We discussed family history of hyperlipidemia and PAD. No known family history of CAD. 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 Discuss various modalities of investigation for CAD. When discussing beta blocker prior to CTA notes on previous Atenolol she had episode of bradycardia due to doubled dose but is agreeable for one time dose of Metoprolol and will have family member drive her to CT.  Checks blood pressure intermittently. Tells me it is often well controlled but overall labile. She does not take her Clonidine 12 hours apart and was agreeable to trial.   EKGs/Labs/Other Studies Reviewed:   The following studies were reviewed today:  Echo 01/2020  1. Left ventricular ejection fraction, by estimation, is 60 to 65%. The  left ventricle has normal function. The left ventricle has no regional  wall motion abnormalities. Left ventricular diastolic parameters are  indeterminate.   2. Right ventricular systolic function is normal. The right ventricular  size is normal.   3. The mitral valve is  normal in structure. No evidence of mitral valve  regurgitation. No evidence of mitral stenosis.   4. The aortic valve is tricuspid. Aortic valve regurgitation is not  visualized. Mild aortic valve sclerosis is present, with no evidence of  aortic valve stenosis.   5. The inferior vena cava is normal in size with greater than 50%  respiratory variability, suggesting right atrial pressure of 3 mmHg.   Cardiopulmonary stress test 02/06/21 Conclusion: Exercise testing with gas exchange indicates a normal functional capacity when compared to matched sedentary norms. There are no clear Cardiopulmonary abnormalities. Note. pre-exercise spirometry shows restrictive patterns, but ventilatory limitations are not reflected in exercise and there is no evidence of EIB.   EKG:  EKG is  ordered today.  The ekg ordered today demonstrates NSR 81bpm with no acute ST/T wave changes.  Recent Labs: 04/24/2020: BUN 15; Creatinine, Ser 1.03; Potassium 3.8; Sodium 143 08/16/2020: Hemoglobin 14.1; Platelets 235.0  Recent Lipid Panel    Component Value Date/Time   CHOL 201 (H) 12/10/2016 1512   CHOL 177 07/24/2014 0845   TRIG 116 09/26/2019 1413   HDL 59 09/26/2019  1413   CHOLHDL 3.0 12/10/2016 1512   VLDL 16 07/24/2014 0845   LDLCALC 123 (H) 12/10/2016 1512   LDLCALC 115 (H) 07/24/2014 0845    Home Medications   Current Meds  Medication Sig  . albuterol (VENTOLIN HFA) 108 (90 Base) MCG/ACT inhaler INHALE 2 PUFFS INTO THE LUNGS EVERY 4 (FOUR) HOURS AS NEEDED FOR WHEEZING OR SHORTNESS OF BREATH.  Marland Kitchen b complex vitamins capsule Take 1 capsule by mouth daily.  . Bacillus Coagulans-Inulin (PROBIOTIC) 1-250 BILLION-MG CAPS Probiotic  Take 1 cap po daily  . botulinum toxin Type A (BOTOX) 100 UNITS SOLR injection Botox - Historical Medication  once every 12 weeks for migraines Active  . budesonide-formoterol (SYMBICORT) 160-4.5 MCG/ACT inhaler Inhale 2 puffs into the lungs in the morning and at bedtime.  .  cetirizine (ZYRTEC) 10 MG tablet TAKE 1 TABLET (10 MG TOTAL) BY MOUTH EVERY EVENING.  . cloNIDine (CATAPRES) 0.1 MG tablet Take 1 tablet (0.1 mg total) by mouth 2 (two) times daily.  Marland Kitchen EPINEPHrine 0.3 mg/0.3 mL IJ SOAJ injection Inject 0.3 mg into the muscle as needed for anaphylaxis.   Eduard Roux (AIMOVIG) 140 MG/ML SOAJ Inject  1 Pen into the skin every 30 days  . ezetimibe (ZETIA) 10 MG tablet Take 10 mg by mouth every evening.   . famotidine (PEPCID) 20 MG tablet Take 20 mg by mouth 2 (two) times daily.  . fluticasone (FLOVENT HFA) 110 MCG/ACT inhaler INHALE 2 PUFFS INTO THE LUNGS 2 (TWO) TIMES DAILY.  . frovatriptan (FROVA) 2.5 MG tablet Take 2.5 mg by mouth 2 (two) times daily as needed for migraine. Max of 3 doses per 7 days  . levothyroxine (SYNTHROID, LEVOTHROID) 50 MCG tablet Take 50 mcg by mouth daily before breakfast.   . Menaquinone-7 (VITAMIN K2 PO) Take by mouth.  . metoprolol tartrate (LOPRESSOR) 100 MG tablet Take 1 tablet (100mg ) 2 hours prior to Cardiac CTA  . omalizumab (XOLAIR) 150 MG injection INJECT 300 MG INTO THE SKIN EVERY 28 (TWENTY-EIGHT) DAYS.  Vladimir Faster Glycol-Propyl Glycol (SYSTANE OP) Place 1-2 drops into both eyes 2 (two) times daily.  . Rimegepant Sulfate 75 MG TBDP DISSOLVE 1 TABLET BY MOUTH ONCE A DAY AS NEEDED FOR MIGRAINE  . rizatriptan (MAXALT) 10 MG tablet Take 10 mg by mouth 2 (two) times daily as needed for migraine. Max doses of 3 doses per 7 days  . spironolactone (ALDACTONE) 50 MG tablet TAKE 1 TABLET BY MOUTH TWICE DAILY.  Marland Kitchen torsemide (DEMADEX) 20 MG tablet TAKE 2 TABLETS BY MOUTH DAILY  . triamcinolone (NASACORT) 55 MCG/ACT AERO nasal inhaler Place 2 sprays into the nose at bedtime.  . Vitamin D, Ergocalciferol, (DRISDOL) 1.25 MG (50000 UNIT) CAPS capsule TAKE 1 CAPSULE BY MOUTH EVERY WEEK  . zoledronic acid (RECLAST) 5 MG/100ML SOLN injection Inject 5 mg into the vein as directed. Once a year.  . [DISCONTINUED] atorvastatin (LIPITOR) 20 MG  tablet TAKE 1 TABLET (20 MG TOTAL) BY MOUTH DAILY. (Patient taking differently: Take 20 mg by mouth at bedtime.)  . [DISCONTINUED] Erenumab-aooe (AIMOVIG, 140 MG DOSE, [place]) Inject into the skin every 30 (thirty) days.  . [DISCONTINUED] frovatriptan (FROVA) 2.5 MG tablet TAKE 1 TABLET BY MOUTH AS DIRECTED (DO NOT EXCEED 3 TABLETS PER 24 HOURS)  . [DISCONTINUED] icosapent Ethyl (VASCEPA) 1 g capsule TAKE 2 CAPSULES TWICE A DAY BY MOUTH.  . [DISCONTINUED] spironolactone (ALDACTONE) 25 MG tablet Take 50 mg by mouth 2 (two) times daily.  . [DISCONTINUED] zafirlukast (  ACCOLATE) 10 MG tablet TAKE 1 TABLET BY MOUTH 2 TIMES DAILY   Current Facility-Administered Medications for the 03/04/21 encounter (Office Visit) with Loel Dubonnet, NP  Medication  . omalizumab Arvid Right) injection 300 mg     Review of Systems       All other systems reviewed and are otherwise negative except as noted above.  Physical Exam    VS:  BP (!) 158/92 (BP Location: Left Arm, Patient Position: Sitting, Cuff Size: Normal)   Pulse 81   Ht 5\' 5"  (1.651 m)   Wt 173 lb (78.5 kg)   BMI 28.79 kg/m  , BMI Body mass index is 28.79 kg/m.  Wt Readings from Last 3 Encounters:  03/04/21 173 lb (78.5 kg)  02/14/21 179 lb 3.2 oz (81.3 kg)  01/14/21 179 lb 9.6 oz (81.5 kg)    GEN: Well nourished, well developed, in no acute distress. HEENT: normal. Neck: Supple, no JVD, carotid bruits, or masses. Cardiac: RRR, no murmurs, rubs, or gallops. No clubbing, cyanosis, edema.  Radials/PT 2+ and equal bilaterally.  Respiratory:  Respirations regular and unlabored, clear to auscultation bilaterally. GI: Soft, nontender, nondistended. MS: No deformity or atrophy. Skin: Warm and dry, no rash. Neuro:  Strength and sensation are intact. Psych: Normal affect.  Assessment & Plan    1. Chest pain / Dyspnea on exertion - Cardiopulmonary stress test with no significant cardiac nor pulmonary limitation though noted -0.9 ST segment change  in lead III. Normal upsloping routinely associated with exercise changes noted on my independent review. EKG today NSR with no acute ST/T wave changes. I anticipate much of her dyspnea and chest pain is post-COVID. However, in setting of hypertension, aortic atherosclerosis, and familial hyperlipidemia will pursue ischemic evaluation. Plan for cardiac CTA. Due to contrast availability if alternate testing required, plan for [facility]. Echocardiogram performed 01/2020 with normal LVEF and no significant valvular abnormalities with stable dyspnea since that time, no indication for repeat echocardiogram.   2. Aortic atherosclerosis - Noted by previous CT. Plan for cardiac CTA, as above. Lipid management, as below.  3. HTN - BP elevated in clinic but reports better control at home. She will keep log and report BP consistently >130/80. Discussed need to take Clonidine doses 12 hours apart to prevent rebound hypertension. May not be best agent but as she has tolerated and has difficulties with multiple medications, will consider. If not at goal at follow up consider addition of ARB.  4. Familial hyperlipidemia / Myalgia - 09/26/19 lipoprotein a 229.8 indicative of familial hyperlipidemia. LDL goal <70 in setting of aortic atherosclerosis. LDL at that time 88. 10 year ASCVD score of 9.9% which is intermediate. History of myalgia since COVID 06/2019. Trial 1 month statin holiday and reassess myalgia. If not improved, consider resume Atorvastatin vs transition to Rosuvastatin to obtain LDL goal <70. If improved, consider referral to lipid clinic for consideration of PCSK9i. Continue Zetia 10mg  daily.  Disposition: Follow up in 4-6 week(s) with Dr. Rockey Situ or APP (or after Cardiac CTA).  Signed, Loel Dubonnet, NP 03/04/2021, 4:37 PM [facility]

## 2021-03-04 NOTE — Patient Instructions (Addendum)
Medication Instructions:   Your physician has recommended you make the following change in your medication:   STOP Atorvastatin for one month for statin-holiday to see if this improves your myaglias   A one time dose of Metoprolol 100 mg tablet to be taken 2 hours prior to your Cardiac CT has been sent to your pharmacy.  *If you need a refill on your cardiac medications before your next appointment, please call your pharmacy*  Lab Work: Your physician recommends lab work : BMP  (needs to be drawn prior to your CT)  Testing/Procedures: Your EKG today shows normal sinus rhythm with no worrisome changes.   Your physician has requested that you have cardiac CT. Cardiac computed tomography (CT) is a painless test that uses an x-ray machine to take clear, detailed pictures of your heart. For further information please visit [email or web address]. Please follow instruction sheet as given.    Follow-Up: At [facility], you and your health needs are our priority.  As part of our continuing mission to provide you with exceptional heart care, we have created designated Provider Care Teams.  These Care Teams include your primary Cardiologist (physician) and Advanced Practice Providers (APPs -  Physician Assistants and Nurse Practitioners) who all work together to provide you with the care you need, when you need it.  We recommend signing up for the patient portal called "MyChart".  Sign up information is provided on this After Visit Summary.  MyChart is used to connect with patients for Virtual Visits (Telemedicine).  Patients are able to view lab/test results, encounter notes, upcoming appointments, etc.  Non-urgent messages can be sent to your provider as well.   To learn more about what you can do with MyChart, go to [email or web address].    Your next appointment:   4-6 week(s)  The format for your next appointment:   In Person  Provider:   You may see Ida Rogue, MD or one of the  following Advanced Practice Providers on your designated Care Team:    Murray Hodgkins, NP  Christell Faith, PA-C  Marrianne Mood, PA-C  Cadence Kathlen Mody, [place]  Other Instructions  Tips to Measure your Blood Pressure Correctly  Here's what you can do to ensure a correct reading: . Don't drink a caffeinated beverage or smoke during the 30 minutes before the test. . Sit quietly for five minutes before the test begins. . During the measurement, sit in a chair with your feet on the floor and your arm supported so your elbow is at about heart level. . The inflatable part of the cuff should completely cover at least 80% of your upper arm, and the cuff should be placed on bare skin, not over a shirt. . Don't talk during the measurement. . Have your blood pressure measured twice, with a brief break in between. If the readings are different by 5 points or more, have it done a third time.   Blood pressure categories  Blood pressure category SYSTOLIC (upper number)  DIASTOLIC (lower number)  Normal Less than 120 mm Hg and Less than 80 mm Hg  Elevated 120-129 mm Hg and Less than 80 mm Hg  High blood pressure: Stage 1 hypertension 130-139 mm Hg or 80-89 mm Hg  High blood pressure: Stage 2 hypertension 140 mm Hg or higher or 90 mm Hg or higher  Hypertensive crisis (consult your doctor immediately) Higher than 180 mm Hg and/or Higher than 120 mm Hg  Source: American Heart Association and American Stroke Association. For  more on getting your blood pressure under control, buy Controlling Your Blood Pressure, a Special Health Report from [facility].   Blood Pressure Log   Date   Time  Blood Pressure  Position  Example: Nov 1 9 AM 124/78 sitting                                                    Your cardiac CT will be scheduled at one of the below locations:   [facility] [address] [place], [place] [address] [phone]  [place] [address] [place], [place] [address] [phone]  If scheduled at [facility], please arrive at the [facility] main entrance (entrance A) of [facility] 30 minutes prior to test start time. Proceed to the [facility] Radiology Department (first floor) to check-in and test prep.  If scheduled at [facility], please arrive 15 mins early for check-in and test prep.  Please follow these instructions carefully (unless otherwise directed):   On the Night Before the Test: . Be sure to Drink plenty of water. . Do not consume any caffeinated/decaffeinated beverages or chocolate 12 hours prior to your test. . Do not take any antihistamines 12 hours prior to your test.   On the Day of the Test: . Drink plenty of water until 1 hour prior to the test. . Do not eat any food 4 hours prior to the test. . You may take your regular medications prior to the test.  . Take metoprolol (Lopressor) two hours prior to test. . FEMALES- please wear underwire-free bra if available        After the Test: . Drink plenty of water. . After receiving IV contrast, you may experience a mild flushed feeling. This is normal. . On occasion, you may experience a mild rash up to 24 hours after the test. This is not dangerous. If this occurs, you can take Benadryl 25 mg and increase your fluid intake. . If you experience trouble breathing, this can be serious. If it is severe call 911 IMMEDIATELY. If it is mild, please call our office. . If you take any of these medications: Glipizide/Metformin, Avandament, Glucavance, please do not take 48 hours after completing test unless otherwise instructed.   Once we have confirmed authorization from your insurance company, we will call you to set up a date and time for your test. Based on how quickly your insurance processes prior authorizations requests, please allow up to 4 weeks to be  contacted for scheduling your Cardiac CT appointment. Be advised that routine Cardiac CT appointments could be scheduled as many as 8 weeks after your provider has ordered it.  For non-scheduling related questions, please contact the cardiac imaging nurse navigator should you have any questions/concerns: Marchia Bond, Cardiac Imaging Nurse Navigator Gordy Clement, Cardiac Imaging Nurse Navigator [facility] Direct Office Dial: [phone]   For scheduling needs, including cancellations and rescheduling, please call [place], [phone].

## 2021-03-10 ENCOUNTER — Other Ambulatory Visit (HOSPITAL_COMMUNITY): Payer: Self-pay

## 2021-03-10 MED FILL — Ezetimibe Tab 10 MG: ORAL | 90 days supply | Qty: 90 | Fill #0 | Status: AC

## 2021-03-11 ENCOUNTER — Other Ambulatory Visit (HOSPITAL_COMMUNITY): Payer: Self-pay

## 2021-03-12 ENCOUNTER — Other Ambulatory Visit (HOSPITAL_COMMUNITY): Payer: Self-pay

## 2021-03-13 DIAGNOSIS — M810 Age-related osteoporosis without current pathological fracture: Secondary | ICD-10-CM | POA: Insufficient documentation

## 2021-03-14 ENCOUNTER — Other Ambulatory Visit: Payer: Self-pay

## 2021-03-19 ENCOUNTER — Other Ambulatory Visit (HOSPITAL_COMMUNITY): Payer: Self-pay

## 2021-03-19 MED FILL — Omalizumab For Inj 150 MG: SUBCUTANEOUS | 28 days supply | Qty: 2 | Fill #1 | Status: AC

## 2021-03-19 MED FILL — Cetirizine HCl Tab 10 MG: ORAL | 30 days supply | Qty: 30 | Fill #0 | Status: AC

## 2021-03-19 MED FILL — Rimegepant Sulfate Tab Disint 75 MG: ORAL | 30 days supply | Qty: 8 | Fill #1 | Status: AC

## 2021-03-24 ENCOUNTER — Other Ambulatory Visit (HOSPITAL_COMMUNITY): Payer: Self-pay

## 2021-03-24 MED FILL — Ergocalciferol Cap 1.25 MG (50000 Unit): ORAL | 84 days supply | Qty: 12 | Fill #0 | Status: AC

## 2021-03-25 ENCOUNTER — Ambulatory Visit (INDEPENDENT_AMBULATORY_CARE_PROVIDER_SITE_OTHER): Payer: 59 | Admitting: *Deleted

## 2021-03-25 ENCOUNTER — Other Ambulatory Visit: Payer: Self-pay

## 2021-03-25 ENCOUNTER — Other Ambulatory Visit (HOSPITAL_COMMUNITY): Payer: Self-pay

## 2021-03-25 DIAGNOSIS — L508 Other urticaria: Secondary | ICD-10-CM

## 2021-03-25 DIAGNOSIS — L501 Idiopathic urticaria: Secondary | ICD-10-CM | POA: Diagnosis not present

## 2021-03-25 MED ORDER — ZAFIRLUKAST 10 MG PO TABS
10.0000 mg | ORAL_TABLET | Freq: Two times a day (BID) | ORAL | 3 refills | Status: DC
  Filled 2021-03-25: qty 180, 90d supply, fill #0

## 2021-03-26 ENCOUNTER — Other Ambulatory Visit (HOSPITAL_COMMUNITY): Payer: Self-pay

## 2021-03-29 MED FILL — Fluticasone Propionate HFA Inhal Aer 110 MCG/ACT: RESPIRATORY_TRACT | 30 days supply | Qty: 12 | Fill #1 | Status: CN

## 2021-03-29 MED FILL — Albuterol Sulfate Inhal Aero 108 MCG/ACT (90MCG Base Equiv): RESPIRATORY_TRACT | 16 days supply | Qty: 18 | Fill #0 | Status: AC

## 2021-03-31 ENCOUNTER — Other Ambulatory Visit (HOSPITAL_COMMUNITY): Payer: Self-pay

## 2021-03-31 ENCOUNTER — Telehealth: Payer: Self-pay | Admitting: Allergy & Immunology

## 2021-03-31 NOTE — Telephone Encounter (Signed)
Please advise to change as flovent is not covered

## 2021-04-01 ENCOUNTER — Other Ambulatory Visit: Payer: Self-pay

## 2021-04-01 ENCOUNTER — Other Ambulatory Visit (HOSPITAL_COMMUNITY): Payer: Self-pay

## 2021-04-01 ENCOUNTER — Other Ambulatory Visit
Admission: RE | Admit: 2021-04-01 | Discharge: 2021-04-01 | Disposition: A | Discharge status: Home / Self Care | Payer: 59 | Attending: Family | Admitting: Family

## 2021-04-01 ENCOUNTER — Encounter: Payer: Self-pay | Admitting: Family

## 2021-04-01 ENCOUNTER — Ambulatory Visit: Payer: 59 | Attending: Internal Medicine

## 2021-04-01 ENCOUNTER — Encounter: Payer: Self-pay | Admitting: Allergy & Immunology

## 2021-04-01 DIAGNOSIS — M25511 Pain in right shoulder: Secondary | ICD-10-CM | POA: Diagnosis not present

## 2021-04-01 DIAGNOSIS — G8929 Other chronic pain: Secondary | ICD-10-CM | POA: Insufficient documentation

## 2021-04-01 DIAGNOSIS — R293 Abnormal posture: Secondary | ICD-10-CM | POA: Insufficient documentation

## 2021-04-01 DIAGNOSIS — R079 Chest pain, unspecified: Secondary | ICD-10-CM | POA: Insufficient documentation

## 2021-04-01 DIAGNOSIS — R519 Headache, unspecified: Secondary | ICD-10-CM | POA: Diagnosis not present

## 2021-04-01 LAB — BASIC METABOLIC PANEL
Anion gap: 7 (ref 5–15)
BUN: 16 mg/dL (ref 8–23)
CO2: 26 mmol/L (ref 22–32)
Calcium: 10.1 mg/dL (ref 8.9–10.3)
Chloride: 102 mmol/L (ref 98–111)
Creatinine, Ser: 0.98 mg/dL (ref 0.44–1.00)
GFR, Estimated: >60 mL/min (ref >60)
Glucose, Bld: 124 mg/dL — ABNORMAL HIGH (ref 70–99)
Potassium: 3.8 mmol/L (ref 3.5–5.1)
Sodium: 135 mmol/L (ref 135–145)

## 2021-04-01 MED ORDER — FLOVENT HFA 110 MCG/ACT IN AERO
2.0000 | INHALATION_SPRAY | Freq: Two times a day (BID) | RESPIRATORY_TRACT | 5 refills | Status: DC
  Filled 2021-04-01: qty 12, 30d supply, fill #0

## 2021-04-01 MED ORDER — FLOVENT HFA 110 MCG/ACT IN AERO
2.0000 | INHALATION_SPRAY | Freq: Two times a day (BID) | RESPIRATORY_TRACT | 5 refills | Status: DC
  Filled 2021-04-01 – 2021-04-02 (×2): qty 12, 30d supply, fill #0
  Filled 2021-05-13: qty 12, 30d supply, fill #1
  Filled 2021-06-06: qty 36, 90d supply, fill #2

## 2021-04-01 MED ORDER — ARMONAIR DIGIHALER 113 MCG/ACT IN AEPB
1.0000 | INHALATION_SPRAY | Freq: Two times a day (BID) | RESPIRATORY_TRACT | 1 refills | Status: DC
  Filled 2021-04-01 (×2): qty 1, 30d supply, fill #0

## 2021-04-01 NOTE — Telephone Encounter (Signed)
Patient called stating she only wants Flovent and she will pay out of pocket for it. Patient states there must be a mistake because her insurance has been covering the [employer].  She doesn't want to try a new medication.    Please send to [facility]

## 2021-04-01 NOTE — Therapy (Signed)
[place] MAIN [facility] SERVICES [address] [place], [place], [address] Phone: [phone]   Fax:  [phone]  Physical Therapy Evaluation  Patient Details  Name: Rachel Fry MRN: [id number] Date of Birth: Nov 22, 1955 Referring Provider (PT): Dr. Karlton Lemon   Encounter Date: 04/01/2021   PT End of Session - 04/01/21 1123    Visit Number 1    Number of Visits 7    Date for PT Re-Evaluation 06/24/21    Authorization Type 1/10 eval 6/7    PT Start Time 0714    PT Stop Time 0758    PT Time Calculation (min) 44 min    Activity Tolerance Patient tolerated treatment well    Behavior During Therapy [facility] for tasks assessed/performed           Past Medical History:  Diagnosis Date  . Allergy   . Asthma   . COVID-19   . History of kidney stones   . Hypothyroidism   . Migraines   . Numbness and tingling    left side of body, occasionally right side   . S/P Botox injection   . Urticaria     Past Surgical History:  Procedure Laterality Date  . ADENOIDECTOMY    . ARM NEUROPLASTY Right 1994 and 1996   for chronic pain  . CESAREAN SECTION    . CYSTOSCOPY W/ RETROGRADES Left 04/23/2020   Procedure: CYSTOSCOPY WITH RETROGRADE PYELOGRAM;  Surgeon: Abbie Sons, MD;  Location: [facility] ORS;  Service: Urology;  Laterality: Left;  . CYSTOSCOPY W/ RETROGRADES Left 05/13/2020   Procedure: CYSTOSCOPY WITH RETROGRADE PYELOGRAM;  Surgeon: Hollice Espy, MD;  Location: [facility] ORS;  Service: Urology;  Laterality: Left;  . CYSTOSCOPY WITH STENT PLACEMENT Left 04/23/2020   Procedure: CYSTOSCOPY WITH STENT PLACEMENT;  Surgeon: Abbie Sons, MD;  Location: [facility] ORS;  Service: Urology;  Laterality: Left;  . CYSTOSCOPY/URETEROSCOPY/HOLMIUM LASER/STENT PLACEMENT Left 05/13/2020   Procedure: CYSTOSCOPY/URETEROSCOPY/LASER/STENT EXCHANGE;  Surgeon: Hollice Espy, MD;  Location: [facility] ORS;  Service: Urology;  Laterality: Left;  . OVARIAN CYST REMOVAL  1973  .  STONE EXTRACTION WITH BASKET  05/13/2020   Procedure: STONE EXTRACTION WITH BASKET;  Surgeon: Hollice Espy, MD;  Location: [facility] ORS;  Service: Urology;;  . TONSILLECTOMY    . TONSILLECTOMY AND ADENOIDECTOMY    . TUBAL LIGATION  1988    There were no vitals filed for this visit.    Subjective Assessment - 04/01/21 1119    Subjective Patient is a 65 year old female who presents to physical therapy for migraines with specific referral for dry needling.    Pertinent History Patient is a 65 year old female who presents to physical therapy for migraines with specific referral for dry needling.  Patient has been having migraines for about 14-15 years. They have changed from front temporal all day long 2-3/10, a couple years ago started having tingling in left side of body, numbness in arm and leg. Additionally sometimes get's belly aches.  Changed medication in 2020. Was getting trigger injections but it was too expensive. PMH of HLD, aortic atherosclerosis, asthma, COVID 19, SOB, hypothyroidism, atypical facial pain (2019), neuropathy (2020), fibromyalgia    Limitations Reading;House hold activities;Other (comment)    How long can you sit comfortably? n/a    How long can you stand comfortably? n/a    How long can you walk comfortably? n/a    Diagnostic tests negative MRI for cervical, thoracic, brain    Patient Stated Goals  to help manage symptoms    Currently in Pain? Yes    Pain Score 2     Pain Location Head    Pain Descriptors / Indicators Headache    Pain Type Chronic pain    Pain Onset More than a month ago              [facility] PT Assessment - 04/01/21 0001      Assessment   Medical Diagnosis migraine    Referring Provider (PT) Dr. Karlton Lemon    Onset Date/Surgical Date 04/16/92    Hand Dominance Left    Prior Therapy extensive chiropractic, massage therapy, trigger point injections, botox      Precautions   Precautions None      Restrictions   Weight Bearing Restrictions No       Balance Screen   Has the patient fallen in the past 6 months No    Has the patient had a decrease in activity level because of a fear of falling?  No    Is the patient reluctant to leave their home because of a fear of falling?  No      Home Ecologist residence      Prior Function   Level of Independence Independent    Vocation Full time employment    Vocation Requirements bending, lifting, sitting      Cognition   Overall Cognitive Status Within Functional Limits for tasks assessed      Observation/Other Assessments   Focus on Therapeutic Outcomes (FOTO)  50%             PAIN: Worst pain: 5-6/10 with medication Least amount of pain: 1/10    POSTURE: Slight forward head rounded shoulders in seated and standing position   PROM/AROM:  AROM BUE: WFL bilaterally however has limited IR/ER : T1/T2 respectively and L5.   AROM BLE:  :WFL   Cervical AROM seated:    Right Left  Flexion WFL  Extension WFL  Side Bending 33 44  Rotation 34 30   No pain just tightness  Accessory Motions:  Cervical: grade II UPA/CPA with hypombility noted throughout cervical spine with L > R.  Thoracic: upper thoracic UPA/CPA hypomobile with anterior rotation of rib cage and excessive thoracic curvature.     SENSATION:  BUE :  BLE :   NEUROLOGICAL SCREEN: (2+ unless otherwise noted.) N=normal  Ab=abnormal   Level Dermatome R L  C3 Anterior Neck  N N  C4 Top of Shoulder N N  C5 Lateral Upper Arm  N N  C6 Lateral Arm/ Thumb  N N  C7 Middle Finger  N N  C8 4th & 5th Finger N N  T1 Medial Arm N N  L2 Medial thigh/groin N N  L3 Lower thigh/med.knee N N  L4 Medial leg/lat thigh N N  L5 Lat. leg & dorsal foot N N  S1 post/lat foot/thigh/leg N N  S2 Post./med. thigh & leg N N    SOMATOSENSORY:  Any N & T in extremities or weakness: reports :         Sensation           Intact      Diminished         Absent  Light touch LEs LUE and LLE  when having migraine             OUTCOME MEASURES: TEST Outcome Interpretation  MIDAS  Midas Grade I Little to  no disability   FOTO 50% Predicted discharge score of 59%              Access Code: [email or web address] URL: [email or web address] Date: 04/01/2021 Prepared by: Janna Arch  Exercises Seated Scapular Retraction - 1 x daily - 7 x weekly - 2 sets - 10 reps - 5 hold Upper Cervical Extension SNAG with Strap - 1 x daily - 7 x weekly - 2 sets - 10 reps - 5 hold Seated Chest Stretch with Hands Behind Head - 1 x daily - 7 x weekly - 2 sets - 10 reps - 5 hold  Trigger Point Dry Needling (TDN), unbilled Education performed with patient regarding potential benefit of TDN. Reviewed precautions and risks with patient. Reviewed special precautions/risks over lung fields which include pneumothorax. Reviewed signs and symptoms of pneumothorax and advised pt to go to ER immediately if these symptoms develop advise them of dry needling treatment. Extensive time spent with pt to ensure full understanding of TDN risks. Pt provided verbal consent to treatment. TDN performed to  with 0.25 x 40 single needle placements with local twitch response (LTR). Pistoning technique utilized. Improved pain-free motion following intervention. L cervical paraspinal, Upper trap (L and R), temporalis, and masseter  Objective measurements completed on examination: See above findings.      Patient is a pleasant 65 year old female who presents to physical therapy for for migraines. She has limited cervical ROM as well as postural deficits that will additionally require focus. Her cervical and thoracic spine are hypomobile with extensive muscle guarding noted throughout session. Postural interventions given for HEP and first round of dry needling performed today with patient verbalizing consent for treatment. Patient will benefit from every other week sessions at this time. She will benefit from skilled  physical therapy to reduce pain, improve patient independence with symptom management, and improve quality of life.          PT Education - 04/01/21 1122    Education provided Yes    Education Details goals, POC    Person(s) Educated Patient    Methods Explanation;Demonstration;Tactile cues;Verbal cues    Comprehension Verbalized understanding;Returned demonstration;Verbal cues required;Tactile cues required            PT Short Term Goals - 04/01/21 1128      PT SHORT TERM GOAL #1   Title Pt will be independent with HEP in order to decrease pain in order to improve pain-free function at home and work.    Baseline 6/7: HEP given    Time 6    Period Weeks    Status New    Target Date 05/13/21             PT Long Term Goals - 04/01/21 1129      PT LONG TERM GOAL #1   Title Patient will increase FOTO score to equal to or greater than 59%    to demonstrate statistically significant improvement in mobility and quality of life.    Baseline 6/7: 50%    Time 12    Period Weeks    Status New    Target Date 06/24/21      PT LONG TERM GOAL #2   Title Pt will decrease worst pain as reported on NPRS by at least 3 points (3/10) in order to demonstrate clinically significant reduction in pain.    Baseline 6/7: 6/10    Time 12    Period Weeks    Status New  Target Date 06/24/21      PT LONG TERM GOAL #3   Title Pt will demonstrate pain-free full range cervical motion in order to perform IADLs such as driving and household chores without increase in symptoms.    Baseline 6/7: see note for limitation    Time 12    Period Weeks    Status New    Target Date 06/24/21      PT LONG TERM GOAL #4   Title Patient will have less than two days a week of high pain migraines and demonstrate ability to self control/contain migraines.    Time 12    Period Weeks    Status New    Target Date 06/24/21                  Plan - 04/01/21 1126    Clinical Impression Statement  Patient is a pleasant 65 year old female who presents to physical therapy for for migraines. She has limited cervical ROM as well as postural deficits that will additionally require focus. Her cervical and thoracic spine are hypomobile with extensive muscle guarding noted throughout session. Postural interventions given for HEP and first round of dry needling performed today with patient verbalizing consent for treatment. Patient will benefit from every other week sessions at this time. She will benefit from skilled physical therapy to reduce pain, improve patient independence with symptom management, and improve quality of life    Personal Factors and Comorbidities Age;Behavior Pattern;Comorbidity 3+;Fitness;Past/Current Experience;Profession;Time since onset of injury/illness/exacerbation    Comorbidities HLD, aortic atherosclerosis, asthma, COVID 19, SOB, hypothyroidism, atypical facial pain (2019), neuropathy (2020), fibromyalgia    Examination-Activity Limitations Caring for Others;Carry;Lift;Sleep;Transfers    Examination-Participation Restrictions Cleaning;Community Activity;Interpersonal Relationship;Driving;Laundry;Shop;Occupation;Meal Prep;Yard Work    Merchant navy officer Evolving/Moderate complexity    Clinical Decision Making Moderate    Rehab Potential Fair    PT Frequency Biweekly    PT Duration 12 weeks    PT Treatment/Interventions ADLs/Self Care Home Management;Dry needling;Manual techniques;Therapeutic exercise;Therapeutic activities;Neuromuscular re-education;Aquatic Therapy;Biofeedback;Canalith Repostioning;Cryotherapy;Electrical Stimulation;Iontophoresis 4mg /ml Dexamethasone;Moist Heat;Ultrasound;Patient/family education;Passive range of motion;Energy conservation;Vestibular;Joint Manipulations;Traction;Gait training;Balance training;Taping;Spinal Manipulations;Visual/perceptual remediation/compensation    PT Next Visit Plan Perform trigger point dry needling and manual  techniques until pt can tolerate active strengthening, additional chronic pain education    PT Home Exercise Plan see above    Consulted and Agree with Plan of Care Patient           Patient will benefit from skilled therapeutic intervention in order to improve the following deficits and impairments:  Impaired perceived functional ability,Impaired UE functional use,Pain,Decreased mobility,Decreased range of motion,Decreased strength,Hypomobility,Impaired flexibility,Increased muscle spasms,Postural dysfunction,Improper body mechanics  Visit Diagnosis: Abnormal posture  Nonintractable headache, unspecified chronicity pattern, unspecified headache type     Problem List Patient Active Problem List   Diagnosis Date Noted  . 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 hauler 12/25/2020  . Chronic urticaria 12/25/2020  . Moderate persistent asthma, uncomplicated 50/27/7412  . Ageusia 08/12/2020  . 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 Myalgia, epidemic 08/12/2020  . Cough with exposure to COVID-19 virus 08/12/2020  . Olfactory impairment 05/29/2020  . Acute UTI 04/23/2020  . Left ureteral stone 04/23/2020  . Intermittent chest pain 02/09/2020  . History of COVID-19 02/09/2020  . Shortness of breath 02/09/2020  . Loss of taste 02/09/2020  . Loss of smell 02/09/2020  . Rash 02/09/2020  . Arthralgia 02/09/2020  . Postoperative history of checked last year 04/22/2017  . Weakness of left arm 04/22/2017  . Numbness and tingling in left  arm 04/22/2017  . Lower back pain 04/02/2016  . Acute stress disorder 02/25/2015  . Anxiety 02/25/2015  . Airway hyperreactivity 02/25/2015  . Bradycardia 02/25/2015  . Hypersomnia 02/25/2015  . Clinical depression 02/25/2015  . Elevated blood sugar 02/25/2015  . 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 Acid reflux 02/25/2015  . 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 H/O disease 02/25/2015  . Hypercholesteremia 02/25/2015  . Headache, migraine 02/25/2015  . Muscle ache 02/25/2015  . Allergic rhinitis,  seasonal 02/25/2015  . Avitaminosis D 02/25/2015   Janna Arch, PT, DPT   04/01/2021, 11:34 AM  [place] MAIN [facility] SERVICES [address] [place], [place], [address] Phone: [phone]   Fax:  [phone]  Name: RONEISHA STERN MRN: [id number] Date of Birth: Apr 17, 1956

## 2021-04-01 NOTE — Telephone Encounter (Signed)
Great - I sent in Flovent two puffs BID.   Salvatore Marvel, MD [facility]

## 2021-04-01 NOTE — Patient Instructions (Signed)
Access Code: [email or web address] URL: [email or web address] Date: 04/01/2021 Prepared by: Janna Arch  Exercises Seated Scapular Retraction - 1 x daily - 7 x weekly - 2 sets - 10 reps - 5 hold Upper Cervical Extension SNAG with Strap - 1 x daily - 7 x weekly - 2 sets - 10 reps - 5 hold Seated Chest Stretch with Hands Behind Head - 1 x daily - 7 x weekly - 2 sets - 10 reps - 5 hold

## 2021-04-01 NOTE — Telephone Encounter (Signed)
Pt called wondering why Armonair was sent in, I did read back to her Carrie's message. Patient states she will call the pharmacy as her medication has always been covered. Patient also states that armonair was sent in to wrong pharmacy. Pharmacy should be [facility].

## 2021-04-01 NOTE — Telephone Encounter (Signed)
Iam assuming. But there is a telephone contact stating pt wants to stay on flovent and will pay out of pocket for it

## 2021-04-01 NOTE — Addendum Note (Signed)
Addended by: Valentina Shaggy on: 04/01/2021 12:46 PM   Modules accepted: Orders

## 2021-04-01 NOTE — Addendum Note (Signed)
Addended by: Felipa Emory on: 04/01/2021 08:26 AM   Modules accepted: Orders

## 2021-04-02 ENCOUNTER — Other Ambulatory Visit: Payer: Self-pay

## 2021-04-02 ENCOUNTER — Telehealth (HOSPITAL_COMMUNITY): Payer: Self-pay | Admitting: Emergency Medicine

## 2021-04-02 ENCOUNTER — Other Ambulatory Visit (HOSPITAL_COMMUNITY): Payer: Self-pay

## 2021-04-02 DIAGNOSIS — L309 Dermatitis, unspecified: Secondary | ICD-10-CM | POA: Diagnosis not present

## 2021-04-02 MED ORDER — TRIAMCINOLONE ACETONIDE 0.1 % EX CREA
TOPICAL_CREAM | CUTANEOUS | 0 refills | Status: DC
  Filled 2021-04-02: qty 80, 30d supply, fill #0

## 2021-04-02 MED ORDER — ATORVASTATIN CALCIUM 20 MG PO TABS
20.0000 mg | ORAL_TABLET | Freq: Every day | ORAL | 1 refills | Status: DC
  Filled 2021-04-02: qty 90, 90d supply, fill #0

## 2021-04-02 MED ORDER — ATORVASTATIN CALCIUM 20 MG PO TABS
20.0000 mg | ORAL_TABLET | Freq: Every day | ORAL | 1 refills | Status: DC
  Filled 2021-04-02 – 2021-06-05 (×2): qty 90, 90d supply, fill #0
  Filled 2021-09-05: qty 90, 90d supply, fill #1

## 2021-04-02 NOTE — Telephone Encounter (Signed)
Pt returning phone call regarding upcoming cardiac imaging study; pt verbalizes understanding of appt date/time, parking situation and where to check in, pre-test NPO status and medications ordered, and verified current allergies; name and call back number provided for further questions should they arise Marchia Bond RN Navigator Cardiac Imaging [facility] [phone] office [phone] cell   100mg  metoprolol tart 2 hr prior to scan

## 2021-04-02 NOTE — Addendum Note (Signed)
Addended by: Loel Dubonnet on: 04/02/2021 08:20 AM   Modules accepted: Orders

## 2021-04-02 NOTE — Telephone Encounter (Signed)
Attempted to call patient regarding upcoming cardiac CT appointment. °Left message on voicemail with name and callback number °Deloras Reichard RN Navigator Cardiac Imaging °[facility] °[phone] Office °[phone] Cell ° °

## 2021-04-03 ENCOUNTER — Other Ambulatory Visit: Payer: Self-pay

## 2021-04-03 ENCOUNTER — Encounter (HOSPITAL_COMMUNITY): Payer: Self-pay

## 2021-04-03 ENCOUNTER — Ambulatory Visit (HOSPITAL_COMMUNITY)
Admission: RE | Admit: 2021-04-03 | Discharge: 2021-04-03 | Disposition: A | Discharge status: Home / Self Care | Payer: 59 | Source: Ambulatory Visit | Attending: Family | Admitting: Family

## 2021-04-03 ENCOUNTER — Other Ambulatory Visit (HOSPITAL_COMMUNITY): Payer: Self-pay

## 2021-04-03 DIAGNOSIS — R072 Precordial pain: Secondary | ICD-10-CM | POA: Insufficient documentation

## 2021-04-03 DIAGNOSIS — R079 Chest pain, unspecified: Secondary | ICD-10-CM | POA: Insufficient documentation

## 2021-04-03 MED ORDER — NITROGLYCERIN 0.4 MG SL SUBL
SUBLINGUAL_TABLET | SUBLINGUAL | Status: AC
  Filled 2021-04-03: qty 2

## 2021-04-03 MED ORDER — IOHEXOL 350 MG/ML SOLN
95.0000 mL | Freq: Once | INTRAVENOUS | Status: AC | PRN
  Administered 2021-04-03: 95 mL via INTRAVENOUS

## 2021-04-03 MED ORDER — NITROGLYCERIN 0.4 MG SL SUBL
0.8000 mg | SUBLINGUAL_TABLET | Freq: Once | SUBLINGUAL | Status: AC
  Administered 2021-04-03: 0.8 mg via SUBLINGUAL

## 2021-04-04 ENCOUNTER — Other Ambulatory Visit: Payer: Self-pay

## 2021-04-04 ENCOUNTER — Encounter: Payer: Self-pay | Admitting: Allergy & Immunology

## 2021-04-04 NOTE — Telephone Encounter (Signed)
Please advise 

## 2021-04-08 ENCOUNTER — Other Ambulatory Visit: Payer: Self-pay

## 2021-04-08 DIAGNOSIS — Z0001 Encounter for general adult medical examination with abnormal findings: Secondary | ICD-10-CM | POA: Diagnosis not present

## 2021-04-08 DIAGNOSIS — E039 Hypothyroidism, unspecified: Secondary | ICD-10-CM | POA: Diagnosis not present

## 2021-04-08 DIAGNOSIS — E785 Hyperlipidemia, unspecified: Secondary | ICD-10-CM | POA: Diagnosis not present

## 2021-04-08 DIAGNOSIS — E559 Vitamin D deficiency, unspecified: Secondary | ICD-10-CM | POA: Diagnosis not present

## 2021-04-08 DIAGNOSIS — L509 Urticaria, unspecified: Secondary | ICD-10-CM | POA: Diagnosis not present

## 2021-04-08 DIAGNOSIS — R0602 Shortness of breath: Secondary | ICD-10-CM | POA: Diagnosis not present

## 2021-04-08 DIAGNOSIS — Z01411 Encounter for gynecological examination (general) (routine) with abnormal findings: Secondary | ICD-10-CM | POA: Diagnosis not present

## 2021-04-08 DIAGNOSIS — I10 Essential (primary) hypertension: Secondary | ICD-10-CM | POA: Diagnosis not present

## 2021-04-08 DIAGNOSIS — G43909 Migraine, unspecified, not intractable, without status migrainosus: Secondary | ICD-10-CM | POA: Diagnosis not present

## 2021-04-08 DIAGNOSIS — R5383 Other fatigue: Secondary | ICD-10-CM | POA: Diagnosis not present

## 2021-04-08 DIAGNOSIS — G629 Polyneuropathy, unspecified: Secondary | ICD-10-CM | POA: Diagnosis not present

## 2021-04-11 NOTE — Telephone Encounter (Signed)
Rachel Fry have you heard of this happening?

## 2021-04-14 ENCOUNTER — Other Ambulatory Visit (HOSPITAL_COMMUNITY): Payer: Self-pay

## 2021-04-14 MED FILL — Clonidine HCl Tab 0.1 MG: ORAL | 90 days supply | Qty: 180 | Fill #0 | Status: AC

## 2021-04-14 MED FILL — Levothyroxine Sodium Tab 50 MCG: ORAL | 90 days supply | Qty: 90 | Fill #0 | Status: AC

## 2021-04-15 ENCOUNTER — Other Ambulatory Visit: Payer: Self-pay

## 2021-04-15 ENCOUNTER — Ambulatory Visit: Payer: 59

## 2021-04-15 DIAGNOSIS — R519 Headache, unspecified: Secondary | ICD-10-CM | POA: Diagnosis not present

## 2021-04-15 DIAGNOSIS — G8929 Other chronic pain: Secondary | ICD-10-CM | POA: Diagnosis not present

## 2021-04-15 DIAGNOSIS — R293 Abnormal posture: Secondary | ICD-10-CM | POA: Diagnosis not present

## 2021-04-15 DIAGNOSIS — M25511 Pain in right shoulder: Secondary | ICD-10-CM

## 2021-04-15 NOTE — Therapy (Signed)
[place] MAIN [facility] SERVICES [address] [place], [place], [address] Phone: [phone]   Fax:  [phone]  Physical Therapy Treatment  Patient Details  Name: Rachel Fry MRN: [id number] Date of Birth: 08/13/1956 Referring Provider (PT): Dr. Karlton Lemon   Encounter Date: 04/15/2021   PT End of Session - 04/15/21 0713     Visit Number 2    Number of Visits 7    Date for PT Re-Evaluation 06/24/21    Authorization Type 2/10 eval 6/7    PT Start Time 0713    PT Stop Time 0755    PT Time Calculation (min) 42 min    Activity Tolerance Patient tolerated treatment well    Behavior During Therapy [facility] for tasks assessed/performed             Past Medical History:  Diagnosis Date   Allergy    Asthma    COVID-19    History of kidney stones    Hypothyroidism    Migraines    Numbness and tingling    left side of body, occasionally right side    S/P Botox injection    Urticaria     Past Surgical History:  Procedure Laterality Date   ADENOIDECTOMY     ARM NEUROPLASTY Right 1994 and 1996   for chronic pain   CESAREAN SECTION     CYSTOSCOPY W/ RETROGRADES Left 04/23/2020   Procedure: CYSTOSCOPY WITH RETROGRADE PYELOGRAM;  Surgeon: Abbie Sons, MD;  Location: [facility]S;  Service: Urology;  Laterality: Left;   CYSTOSCOPY W/ RETROGRADES Left 05/13/2020   Procedure: CYSTOSCOPY WITH RETROGRADE PYELOGRAM;  Surgeon: Hollice Espy, MD;  Location: [facility];  Service: Urology;  Laterality: Left;   CYSTOSCOPY WITH STENT PLACEMENT Left 04/23/2020   Procedure: CYSTOSCOPY WITH STENT PLACEMENT;  Surgeon: Abbie Sons, MD;  Location: [facility];  Service: Urology;  Laterality: Left;   CYSTOSCOPY/URETEROSCOPY/HOLMIUM LASER/STENT PLACEMENT Left 05/13/2020   Procedure: CYSTOSCOPY/URETEROSCOPY/LASER/STENT EXCHANGE;  Surgeon: Hollice Espy, MD;  Location: [facility] ORS;  Service: Urology;  Laterality: Left;   OVARIAN CYST REMOVAL  1973   STONE  EXTRACTION WITH BASKET  05/13/2020   Procedure: STONE EXTRACTION WITH BASKET;  Surgeon: Hollice Espy, MD;  Location: [facility];  Service: Urology;;   TONSILLECTOMY     TONSILLECTOMY AND ADENOIDECTOMY     TUBAL LIGATION  1988    There were no vitals filed for this visit.   Subjective Assessment - 04/15/21 0903     Subjective Patient reports she has been intermittent compliant with HEP. No falls, is ready to have more focused dry needling.    Pertinent History Patient is a 65 year old female who presents to physical therapy for migraines with specific referral for dry needling.  Patient has been having migraines for about 14-15 years. They have changed from front temporal all day long 2-3/10, a couple years ago started having tingling in left side of body, numbness in arm and leg. Additionally sometimes get's belly aches.  Changed medication in 2020. Was getting trigger injections but it was too expensive. PMH of HLD, aortic atherosclerosis, asthma, COVID 19, SOB, hypothyroidism, atypical facial pain (2019), neuropathy (2020), fibromyalgia    Limitations Reading;House hold activities;Other (comment)    How long can you sit comfortably? n/a    How long can you stand comfortably? n/a    How long can you walk comfortably? n/a    Diagnostic tests negative MRI for cervical, thoracic, brain    Patient  Stated Goals to help manage symptoms    Currently in Pain? Yes    Pain Score 2     Pain Location Head    Pain Descriptors / Indicators Headache    Pain Type Chronic pain    Pain Onset More than a month ago    Pain Frequency Intermittent                 Manual: Cervical side bend with overpressure to glenohumeral joint 4x 30 seconds Cervical rotation with overpressure to glenohumeral joint: 4x 30 seconds Suboccipital release 3x30 seconds STM to levator scap, upper trap, and cervical paraspinals with implementation of effleurage and pettrisage x 7 minutes J mobilization for postural  kyphosis reduction 4x 30 seconds  Trigger Point Dry Needling (TDN), unbilled Education performed with patient regarding potential benefit of TDN. Reviewed precautions and risks with patient. Reviewed special precautions/risks over lung fields which include pneumothorax. Reviewed signs and symptoms of pneumothorax and advised pt to go to ER immediately if these symptoms develop advise them of dry needling treatment. Extensive time spent with pt to ensure full understanding of TDN risks. Pt provided verbal consent to treatment. TDN performed to  with 0.25 x 40 single needle placements with local twitch response (LTR). Pistoning technique utilized. Improved pain-free motion following intervention. L and Rcervical paraspinal, Upper trap (L and R), temporalis, and masseter x 8 minutes   TherEx: Pectoral stretch open window 5x  French pectoral stretch 10x 5 second holds Postural education for reduced tension on cervical region.        Pt educated throughout session about proper posture and technique with exercises. Improved exercise technique, movement at target joints, use of target muscles after min to mod verbal, visual, tactile cues.    Patient tolerated dry needling, manual, and therex well throughout session. Occasional trigger points reduced throughout session as well as focused postural interventions. Masseter limited bilaterally with patient having tooth dysfunction on L side.  She will benefit from skilled physical therapy to reduce pain, improve patient independence with symptom management, and improve quality of life            PT Education - 04/15/21 0713     Education provided Yes    Education Details manual, dry needling    Person(s) Educated Patient    Methods Explanation    Comprehension Verbalized understanding              PT Short Term Goals - 04/01/21 1128       PT SHORT TERM GOAL #1   Title Pt will be independent with HEP in order to decrease pain in  order to improve pain-free function at home and work.    Baseline 6/7: HEP given    Time 6    Period Weeks    Status New    Target Date 05/13/21               PT Long Term Goals - 04/01/21 1129       PT LONG TERM GOAL #1   Title Patient will increase FOTO score to equal to or greater than 59%    to demonstrate statistically significant improvement in mobility and quality of life.    Baseline 6/7: 50%    Time 12    Period Weeks    Status New    Target Date 06/24/21      PT LONG TERM GOAL #2   Title Pt will decrease worst pain as reported on NPRS  by at least 3 points (3/10) in order to demonstrate clinically significant reduction in pain.    Baseline 6/7: 6/10    Time 12    Period Weeks    Status New    Target Date 06/24/21      PT LONG TERM GOAL #3   Title Pt will demonstrate pain-free full range cervical motion in order to perform IADLs such as driving and household chores without increase in symptoms.    Baseline 6/7: see note for limitation    Time 12    Period Weeks    Status New    Target Date 06/24/21      PT LONG TERM GOAL #4   Title Patient will have less than two days a week of high pain migraines and demonstrate ability to self control/contain migraines.    Time 12    Period Weeks    Status New    Target Date 06/24/21                   Plan - 04/15/21 0905     Clinical Impression Statement Patient tolerated dry needling, manual, and therex well throughout session. Occasional trigger points reduced throughout session as well as focused postural interventions. Masseter limited bilaterally with patient having tooth dysfunction on L side.  She will benefit from skilled physical therapy to reduce pain, improve patient independence with symptom management, and improve quality of life    Personal Factors and Comorbidities Age;Behavior Pattern;Comorbidity 3+;Fitness;Past/Current Experience;Profession;Time since onset of injury/illness/exacerbation     Comorbidities HLD, aortic atherosclerosis, asthma, COVID 19, SOB, hypothyroidism, atypical facial pain (2019), neuropathy (2020), fibromyalgia    Examination-Activity Limitations Caring for Others;Carry;Lift;Sleep;Transfers    Examination-Participation Restrictions Cleaning;Community Activity;Interpersonal Relationship;Driving;Laundry;Shop;Occupation;Meal Prep;Yard Work    [employer] Evolving/Moderate complexity    Rehab Potential Fair    PT Frequency Biweekly    PT Duration 12 weeks    PT Treatment/Interventions ADLs/Self Care Home Management;Dry needling;Manual techniques;Therapeutic exercise;Therapeutic activities;Neuromuscular re-education;Aquatic Therapy;Biofeedback;Canalith Repostioning;Cryotherapy;Electrical Stimulation;Iontophoresis 4mg /ml Dexamethasone;Moist Heat;Ultrasound;Patient/family education;Passive range of motion;Energy conservation;Vestibular;Joint Manipulations;Traction;Gait training;Balance training;Taping;Spinal Manipulations;Visual/perceptual remediation/compensation    PT Next Visit Plan Perform trigger point dry needling and manual techniques until pt can tolerate active strengthening, additional chronic pain education    PT Home Exercise Plan see above    Consulted and Agree with Plan of Care Patient             Patient will benefit from skilled therapeutic intervention in order to improve the following deficits and impairments:  Impaired perceived functional ability, Impaired UE functional use, Pain, Decreased mobility, Decreased range of motion, Decreased strength, Hypomobility, Impaired flexibility, Increased muscle spasms, Postural dysfunction, Improper body mechanics  Visit Diagnosis: Abnormal posture  Nonintractable headache, unspecified chronicity pattern, unspecified headache type  Chronic right shoulder pain     Problem List Patient Active Problem List   Diagnosis Date Noted   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 hauler 12/25/2020   Chronic  urticaria 12/25/2020   Moderate persistent asthma, uncomplicated 32/67/1245   Ageusia 08/12/2020   Anosmia 08/12/2020   Myalgia, epidemic 08/12/2020   Cough with exposure to COVID-19 virus 08/12/2020   Olfactory impairment 05/29/2020   Acute UTI 04/23/2020   Left ureteral stone 04/23/2020   Intermittent chest pain 02/09/2020   History of COVID-19 02/09/2020   Shortness of breath 02/09/2020   Loss of taste 02/09/2020   Loss of smell 02/09/2020   Rash 02/09/2020   Arthralgia 02/09/2020   Postoperative history of checked last year 04/22/2017   Weakness of  left arm 04/22/2017   Numbness and tingling in left arm 04/22/2017   Lower back pain 04/02/2016   Acute stress disorder 02/25/2015   Anxiety 02/25/2015   Airway hyperreactivity 02/25/2015   Bradycardia 02/25/2015   Hypersomnia 02/25/2015   Clinical depression 02/25/2015   Elevated blood sugar 02/25/2015   Fibrositis 02/25/2015   Acid reflux 02/25/2015   Hepatitis non A non B 02/25/2015   H/O disease 02/25/2015   Hypercholesteremia 02/25/2015   Headache, migraine 02/25/2015   Muscle ache 02/25/2015   Allergic rhinitis, seasonal 02/25/2015   Avitaminosis D 02/25/2015   Janna Arch, PT, DPT  04/15/2021, 9:07 AM  [place] [facility] MAIN [facility] SERVICES [address] [place], [place], [address] Phone: [phone]   Fax:  [phone]  Name: Rachel Fry MRN: [id number] Date of Birth: 08/28/1956

## 2021-04-16 ENCOUNTER — Other Ambulatory Visit (HOSPITAL_COMMUNITY): Payer: Self-pay

## 2021-04-16 ENCOUNTER — Encounter: Payer: Self-pay | Admitting: Cardiovascular Disease

## 2021-04-16 ENCOUNTER — Ambulatory Visit (INDEPENDENT_AMBULATORY_CARE_PROVIDER_SITE_OTHER): Payer: 59 | Admitting: Cardiovascular Disease

## 2021-04-16 VITALS — BP 130/80 | HR 69 | Ht 65.0 in | Wt 171.5 lb

## 2021-04-16 DIAGNOSIS — U099 Post covid-19 condition, unspecified: Secondary | ICD-10-CM

## 2021-04-16 DIAGNOSIS — J4540 Moderate persistent asthma, uncomplicated: Secondary | ICD-10-CM

## 2021-04-16 DIAGNOSIS — R0602 Shortness of breath: Secondary | ICD-10-CM

## 2021-04-16 MED FILL — Omalizumab For Inj 150 MG: SUBCUTANEOUS | 28 days supply | Qty: 2 | Fill #2 | Status: AC

## 2021-04-16 NOTE — Progress Notes (Signed)
Cardiology Office Note  Date:  04/16/2021   ID:  Rachel Fry, Rachel Fry 1956/02/19, MRN [id number]  PCP:  [name], MD   Chief Complaint  Patient presents with   Follow up Cardiac CTA     Patient c/o shortness of breath. Medications reviewed by the patient verbally.     HPI:  Rachel Fry is a 64 year old woman with past medical history of Hyperlipidemia Minimal aortic atherosclerosis on CT scan 2015 Hx of asthma  tested positive for Covid  in September with her health at work. Who presents for new patient evaluation of her shortness of breath, chest tightness  Since she had Covid end of 2021  Following infection had symptoms of shortness of breath with exertion,  muscle and joint pain, headaches, and rash.   loss of taste and smell.    intermittent chest pain  see neurology for chronic migraines.    Patient has been to dermatology for rash.   For continued shortness of breath, underwent cardiopulmonary exercise testing, noted to have normal exercise capacity No cardiac or pulmonary limitation  Had cardiac CTA June 2022 Calcium score 0, no coronary disease  Continued shortness of breath on discussion today Weight trending downward, off prednisone  EKG personally reviewed by myself on todays visit Shows normal sinus rhythm with no significant ST-T wave changes  PMH:   has a past medical history of Allergy, Asthma, COVID-19, History of kidney stones, Hypothyroidism, Migraines, Numbness and tingling, S/P Botox injection, and Urticaria.  PSH:    Past Surgical History:  Procedure Laterality Date   ADENOIDECTOMY     ARM NEUROPLASTY Right 1994 and 1996   for chronic pain   CESAREAN SECTION     CYSTOSCOPY W/ RETROGRADES Left 04/23/2020   Procedure: CYSTOSCOPY WITH RETROGRADE PYELOGRAM;  Surgeon: Abbie Sons, MD;  Location: [facility];  Service: Urology;  Laterality: Left;   CYSTOSCOPY W/ RETROGRADES Left 05/13/2020   Procedure: CYSTOSCOPY WITH RETROGRADE PYELOGRAM;   Surgeon: Hollice Espy, MD;  Location: [facility];  Service: Urology;  Laterality: Left;   CYSTOSCOPY WITH STENT PLACEMENT Left 04/23/2020   Procedure: CYSTOSCOPY WITH STENT PLACEMENT;  Surgeon: Abbie Sons, MD;  Location: [facility];  Service: Urology;  Laterality: Left;   CYSTOSCOPY/URETEROSCOPY/HOLMIUM LASER/STENT PLACEMENT Left 05/13/2020   Procedure: CYSTOSCOPY/URETEROSCOPY/LASER/STENT EXCHANGE;  Surgeon: Hollice Espy, MD;  Location: [facility];  Service: Urology;  Laterality: Left;   OVARIAN CYST REMOVAL  1973   STONE EXTRACTION WITH BASKET  05/13/2020   Procedure: STONE EXTRACTION WITH BASKET;  Surgeon: Hollice Espy, MD;  Location: [facility];  Service: Urology;;   TONSILLECTOMY     TONSILLECTOMY AND ADENOIDECTOMY     TUBAL LIGATION  1988    Current Outpatient Medications  Medication Sig Dispense Refill   albuterol (VENTOLIN HFA) 108 (90 Base) MCG/ACT inhaler INHALE 2 PUFFS INTO THE LUNGS EVERY 4 (FOUR) HOURS AS NEEDED FOR WHEEZING OR SHORTNESS OF BREATH. 18 g 1   atorvastatin (LIPITOR) 20 MG tablet Take 1 tablet (20 mg total) by mouth daily. 90 tablet 1   b complex vitamins capsule Take 1 capsule by mouth daily.     Bacillus Coagulans-Inulin (PROBIOTIC) 1-250 BILLION-MG CAPS Probiotic  Take 1 cap po daily     botulinum toxin Type A (BOTOX) 100 UNITS SOLR injection Botox - Historical Medication  once every 12 weeks for migraines Active     cetirizine (ZYRTEC) 10 MG tablet TAKE 1 TABLET (10 MG TOTAL) BY MOUTH EVERY EVENING. 30 tablet 5  cloNIDine (CATAPRES) 0.1 MG tablet TAKE 1 TABLET BY MOUTH TWICE DAILY 180 tablet 3   EPINEPHrine 0.3 mg/0.3 mL IJ SOAJ injection Inject 0.3 mg into the muscle as needed for anaphylaxis.   1   Erenumab-aooe (AIMOVIG) 140 MG/ML SOAJ Inject  1 Pen into the skin every 30 days 1 mL 3   ezetimibe (ZETIA) 10 MG tablet TAKE 1 TABLET BY MOUTH ONCE DAILY 90 tablet 3   famotidine (PEPCID) 20 MG tablet Take 20 mg by mouth 2 (two) times daily.     fluticasone  (FLOVENT HFA) 110 MCG/ACT inhaler Inhale 2 puffs into the lungs 2 (two) times daily. 12 g 5   frovatriptan (FROVA) 2.5 MG tablet Take 2.5 mg by mouth 2 (two) times daily as needed for migraine. Max of 3 doses per 7 days     levothyroxine (SYNTHROID) 50 MCG tablet TAKE 1 TABLET BY MOUTH ONCE DAILY 90 tablet 3   Menaquinone-7 (VITAMIN K2 PO) Take by mouth.     omalizumab (XOLAIR) 150 MG injection INJECT 300 MG INTO THE SKIN EVERY 28 (TWENTY-EIGHT) DAYS. 2 each 11   Polyethyl Glycol-Propyl Glycol (SYSTANE OP) Place 1-2 drops into both eyes 2 (two) times daily.     Rimegepant Sulfate 75 MG TBDP DISSOLVE 1 TABLET BY MOUTH ONCE A DAY AS NEEDED FOR MIGRAINE 8 tablet 5   rizatriptan (MAXALT) 10 MG tablet Take 10 mg by mouth 2 (two) times daily as needed for migraine. Max doses of 3 doses per 7 days     spironolactone (ALDACTONE) 50 MG tablet TAKE 1 TABLET BY MOUTH TWICE DAILY. 180 tablet 2   torsemide (DEMADEX) 20 MG tablet Take 20 mg by mouth daily as needed.     triamcinolone (NASACORT) 55 MCG/ACT AERO nasal inhaler Place 2 sprays into the nose at bedtime.     triamcinolone cream (KENALOG) 0.1 % Apply twice daily to rash up to 2 weeks/month as needed. 80 g 0   Vitamin D, Ergocalciferol, (DRISDOL) 1.25 MG (50000 UNIT) CAPS capsule TAKE 1 CAPSULE BY MOUTH EVERY WEEK 12 capsule 3   zafirlukast (ACCOLATE) 10 MG tablet TAKE 1 TABLET BY MOUTH TWICE DAILY 180 tablet 3   zoledronic acid (RECLAST) 5 MG/100ML SOLN injection Inject 5 mg into the vein as directed. Once a year.     Current Facility-Administered Medications  Medication Dose Route Frequency Provider Last Rate Last Admin   omalizumab Arvid Right) injection 300 mg  300 mg Subcutaneous Q28 days Valentina Shaggy, MD   300 mg at 03/25/21 1807     Allergies:   Flonase [fluticasone propionate], Latex, Fish oil, Percocet [oxycodone-acetaminophen], Sulfa antibiotics, and Codeine   Social History:  The patient  reports that she has never smoked. She has  never used smokeless tobacco. She reports that she does not drink alcohol and does not use drugs.   Family History:   family history includes Birth defects in her brother; Breast cancer (age of onset: 85) in her mother; Cancer in her brother; Cardiomyopathy in her father; Colon cancer in her maternal grandmother; Congenital heart disease in her brother; Dementia in her mother; Hyperlipidemia in her brother and brother; Hypertension in her brother, brother, brother, and mother; Peripheral Artery Disease in her father; Stroke in her mother and paternal grandfather; Thyroid disease in her brother and mother.    Review of Systems: Review of Systems  Constitutional: Negative.   HENT: Negative.    Respiratory:  Positive for shortness of breath.   Cardiovascular: Negative.  Gastrointestinal: Negative.   Musculoskeletal: Negative.   Neurological: Negative.   Psychiatric/Behavioral: Negative.    All other systems reviewed and are negative.  PHYSICAL EXAM: VS:  BP 130/80 (BP Location: Left Arm, Patient Position: Sitting, Cuff Size: Normal)   Pulse 69   Ht 5\' 5"  (1.651 m)   Wt 171 lb 8 oz (77.8 kg)   SpO2 97%   BMI 28.54 kg/m  , BMI Body mass index is 28.54 kg/m. Constitutional:  oriented to person, place, and time. No distress.  HENT:  Head: Grossly normal Eyes:  no discharge. No scleral icterus.  Neck: No JVD, no carotid bruits  Cardiovascular: Regular rate and rhythm, no murmurs appreciated Pulmonary/Chest: Clear to auscultation bilaterally, no wheezes or rails Abdominal: Soft.  no distension.  no tenderness.  Musculoskeletal: Normal range of motion Neurological:  normal muscle tone. Coordination normal. No atrophy Skin: Skin warm and dry Psychiatric: normal affect, pleasant   Recent Labs: 08/16/2020: Hemoglobin 14.1; Platelets 235.0 04/01/2021: BUN 16; Creatinine, Ser 0.98; Potassium 3.8; Sodium 135    Lipid Panel Lab Results  Component Value Date   CHOL 201 (H) 12/10/2016    HDL 59 09/26/2019   LDLCALC 123 (H) 12/10/2016   TRIG 116 09/26/2019      Wt Readings from Last 3 Encounters:  04/16/21 171 lb 8 oz (77.8 kg)  03/04/21 173 lb (78.5 kg)  02/14/21 179 lb 3.2 oz (81.3 kg)      ASSESSMENT AND PLAN:  Shortness of breath Covid infection in September 2021, Continued symptoms since that time Has completed echocardiogram, cardiac CTA, exercise pulmonary stress testing, all normal findings Tried diuretic, no significant improvement  Aortic atherosclerosis Minimal plaque in 2015 noted on CT No significant coronary calcification Will defer to neurology she would like to stay on statin with Zetia given such good findings  COVID-19 Not treated with remdesivir Residual symptoms of shortness of breath     Total encounter time more than 25 minutes  Greater than 50% was spent in counseling and coordination of care with the patient    Signed, Esmond Plants, M.D., Ph.D. [place], [place]

## 2021-04-16 NOTE — Patient Instructions (Addendum)
Medication Instructions:  No changes  If you need a refill on your cardiac medications before your next appointment, please call your pharmacy.   Lab work: No new labs needed  Testing/Procedures: No new testing needed   Follow-Up: At [facility], you and your health needs are our priority.  As part of our continuing mission to provide you with exceptional heart care, we have created designated Provider Care Teams.  These Care Teams include your primary Cardiologist (physician) and Advanced Practice Providers (APPs -  Physician Assistants and Nurse Practitioners) who all work together to provide you with the care you need, when you need it.  You will need a follow up appointment as needed  Providers on your designated Care Team:   Murray Hodgkins, NP Christell Faith, PA-C Marrianne Mood, PA-C [place], [place]  COVID-19 Vaccine Information can be found at: [email or web address].uk For questions related to vaccine distribution or appointments, please email [email or web address] or call [phone].

## 2021-04-17 ENCOUNTER — Other Ambulatory Visit (HOSPITAL_COMMUNITY): Payer: Self-pay

## 2021-04-17 NOTE — Telephone Encounter (Signed)
Patient left email   I did retry the symbicort with aerochamber. The inside of  my mouth got swollen and sore.  I went back to using Florvent that I was on before the symbicort.   Shortness of breath still same.    Sending to Dr. Vaughan Browner for further recommendations.

## 2021-04-17 NOTE — Telephone Encounter (Signed)
Ok to go back on flovent  We can try Breo 200 if she is interested in trying a different inhaler

## 2021-04-18 ENCOUNTER — Other Ambulatory Visit (HOSPITAL_COMMUNITY): Payer: Self-pay

## 2021-04-18 MED ORDER — FLUTICASONE FUROATE-VILANTEROL 200-25 MCG/INH IN AEPB
1.0000 | INHALATION_SPRAY | Freq: Every day | RESPIRATORY_TRACT | 3 refills | Status: DC
Start: 2021-04-18 — End: 2021-06-10
  Filled 2021-04-18: qty 60, 60d supply, fill #0

## 2021-04-19 ENCOUNTER — Other Ambulatory Visit (HOSPITAL_COMMUNITY): Payer: Self-pay

## 2021-04-21 ENCOUNTER — Other Ambulatory Visit (HOSPITAL_COMMUNITY): Payer: Self-pay

## 2021-04-22 ENCOUNTER — Other Ambulatory Visit: Payer: Self-pay

## 2021-04-22 ENCOUNTER — Ambulatory Visit (INDEPENDENT_AMBULATORY_CARE_PROVIDER_SITE_OTHER): Payer: 59 | Admitting: *Deleted

## 2021-04-22 ENCOUNTER — Other Ambulatory Visit (HOSPITAL_COMMUNITY): Payer: Self-pay

## 2021-04-22 DIAGNOSIS — L501 Idiopathic urticaria: Secondary | ICD-10-CM | POA: Diagnosis not present

## 2021-04-22 DIAGNOSIS — L508 Other urticaria: Secondary | ICD-10-CM

## 2021-04-22 MED ORDER — BOTOX 200 UNITS IJ SOLR
INTRAMUSCULAR | 3 refills | Status: DC
  Filled 2021-04-22: qty 1, 84d supply, fill #0
  Filled 2021-07-16: qty 1, 84d supply, fill #1
  Filled 2021-10-16: qty 1, 84d supply, fill #2

## 2021-04-24 ENCOUNTER — Other Ambulatory Visit (HOSPITAL_COMMUNITY): Payer: Self-pay

## 2021-04-24 MED FILL — Rimegepant Sulfate Tab Disint 75 MG: ORAL | 30 days supply | Qty: 8 | Fill #2 | Status: AC

## 2021-04-29 ENCOUNTER — Ambulatory Visit: Payer: 59

## 2021-04-30 ENCOUNTER — Other Ambulatory Visit (HOSPITAL_COMMUNITY): Payer: Self-pay

## 2021-05-03 ENCOUNTER — Other Ambulatory Visit (HOSPITAL_COMMUNITY): Payer: Self-pay

## 2021-05-05 ENCOUNTER — Other Ambulatory Visit (HOSPITAL_COMMUNITY): Payer: Self-pay

## 2021-05-05 MED ORDER — FROVATRIPTAN SUCCINATE 2.5 MG PO TABS
ORAL_TABLET | ORAL | 1 refills | Status: DC
  Filled 2021-05-05: qty 18, 90d supply, fill #0

## 2021-05-06 ENCOUNTER — Other Ambulatory Visit (HOSPITAL_COMMUNITY): Payer: Self-pay

## 2021-05-06 DIAGNOSIS — G43719 Chronic migraine without aura, intractable, without status migrainosus: Secondary | ICD-10-CM | POA: Diagnosis not present

## 2021-05-12 ENCOUNTER — Encounter: Payer: Self-pay | Admitting: Cardiovascular Disease

## 2021-05-13 ENCOUNTER — Other Ambulatory Visit (HOSPITAL_COMMUNITY): Payer: Self-pay

## 2021-05-13 ENCOUNTER — Other Ambulatory Visit: Payer: Self-pay

## 2021-05-13 ENCOUNTER — Ambulatory Visit: Payer: 59 | Attending: Internal Medicine

## 2021-05-13 DIAGNOSIS — G8929 Other chronic pain: Secondary | ICD-10-CM | POA: Insufficient documentation

## 2021-05-13 DIAGNOSIS — M25511 Pain in right shoulder: Secondary | ICD-10-CM | POA: Diagnosis not present

## 2021-05-13 DIAGNOSIS — R293 Abnormal posture: Secondary | ICD-10-CM | POA: Diagnosis not present

## 2021-05-13 DIAGNOSIS — R519 Headache, unspecified: Secondary | ICD-10-CM | POA: Diagnosis not present

## 2021-05-13 MED ORDER — CARESTART COVID-19 HOME TEST VI KIT
PACK | 0 refills | Status: DC
  Filled 2021-05-13: qty 4, 4d supply, fill #0

## 2021-05-13 NOTE — Therapy (Signed)
[place] MAIN [facility] SERVICES [address] [place], [place], [address] Phone: [phone]   Fax:  [phone]  Physical Therapy Treatment  Patient Details  Name: Rachel Fry MRN: [id number] Date of Birth: 10/01/1956 Referring Provider (PT): Dr. Karlton Lemon   Encounter Date: 05/13/2021   PT End of Session - 05/13/21 0807     Visit Number 3    Number of Visits 7    Date for PT Re-Evaluation 06/24/21    Authorization Type 3/10 eval 6/7    PT Start Time 0714    PT Stop Time 0755    PT Time Calculation (min) 41 min    Activity Tolerance Patient tolerated treatment well    Behavior During Therapy [facility] for tasks assessed/performed             Past Medical History:  Diagnosis Date   Allergy    Asthma    COVID-19    History of kidney stones    Hypothyroidism    Migraines    Numbness and tingling    left side of body, occasionally right side    S/P Botox injection    Urticaria     Past Surgical History:  Procedure Laterality Date   ADENOIDECTOMY     ARM NEUROPLASTY Right 1994 and 1996   for chronic pain   CESAREAN SECTION     CYSTOSCOPY W/ RETROGRADES Left 04/23/2020   Procedure: CYSTOSCOPY WITH RETROGRADE PYELOGRAM;  Surgeon: Abbie Sons, MD;  Location: [facility];  Service: Urology;  Laterality: Left;   CYSTOSCOPY W/ RETROGRADES Left 05/13/2020   Procedure: CYSTOSCOPY WITH RETROGRADE PYELOGRAM;  Surgeon: Hollice Espy, MD;  Location: [facility];  Service: Urology;  Laterality: Left;   CYSTOSCOPY WITH STENT PLACEMENT Left 04/23/2020   Procedure: CYSTOSCOPY WITH STENT PLACEMENT;  Surgeon: Abbie Sons, MD;  Location: [facility];  Service: Urology;  Laterality: Left;   CYSTOSCOPY/URETEROSCOPY/HOLMIUM LASER/STENT PLACEMENT Left 05/13/2020   Procedure: CYSTOSCOPY/URETEROSCOPY/LASER/STENT EXCHANGE;  Surgeon: Hollice Espy, MD;  Location: [facility];  Service: Urology;  Laterality: Left;   OVARIAN CYST REMOVAL  1973   STONE  EXTRACTION WITH BASKET  05/13/2020   Procedure: STONE EXTRACTION WITH BASKET;  Surgeon: Hollice Espy, MD;  Location: [facility];  Service: Urology;;   TONSILLECTOMY     TONSILLECTOMY AND ADENOIDECTOMY     TUBAL LIGATION  1988    There were no vitals filed for this visit.   Subjective Assessment - 05/13/21 0713     Subjective Patient missed last session due to husband contracting COVID. Has been about a month since seen last but reports last session helped with migraine and with tooth pain.    Pertinent History Patient is a 65 year old female who presents to physical therapy for migraines with specific referral for dry needling.  Patient has been having migraines for about 14-15 years. They have changed from front temporal all day long 2-3/10, a couple years ago started having tingling in left side of body, numbness in arm and leg. Additionally sometimes get's belly aches.  Changed medication in 2020. Was getting trigger injections but it was too expensive. PMH of HLD, aortic atherosclerosis, asthma, COVID 19, SOB, hypothyroidism, atypical facial pain (2019), neuropathy (2020), fibromyalgia    Limitations Reading;House hold activities;Other (comment)    How long can you sit comfortably? n/a    How long can you stand comfortably? n/a    How long can you walk comfortably? n/a    Diagnostic tests negative  MRI for cervical, thoracic, brain    Patient Stated Goals to help manage symptoms    Currently in Pain? Yes    Pain Score 2     Pain Location Head    Pain Orientation Right;Left    Pain Descriptors / Indicators Headache    Pain Type Chronic pain    Pain Onset More than a month ago    Pain Frequency Intermittent                  Manual: Cervical side bend with overpressure to glenohumeral joint 4x 30 seconds Cervical rotation with overpressure to glenohumeral joint: 4x 30 seconds Suboccipital release 3x30 seconds J mobilization for postural kyphosis reduction 4x 30  seconds Thoracic mobilizations grade II for reduced hypomobility   Trigger Point Dry Needling (TDN), unbilled Education performed with patient regarding potential benefit of TDN. Reviewed precautions and risks with patient. Reviewed special precautions/risks over lung fields which include pneumothorax. Reviewed signs and symptoms of pneumothorax and advised pt to go to ER immediately if these symptoms develop advise them of dry needling treatment. Extensive time spent with pt to ensure full understanding of TDN risks. Pt provided verbal consent to treatment. TDN performed to  with 0.25 x 40 single needle placements with local twitch response (LTR). Pistoning technique utilized. Improved pain-free motion following intervention. L and Rcervical paraspinal, Upper trap (L and R), temporalis, pterygoid and masseter x 18 minutes     TherEx: Pectoral stretch open window on half foam roller 5x; first rep with 20 second hold, last four without hold Trunk extension over half foam roller in seated position 10 x Postural education for reduced tension on cervical region.            Pt educated throughout session about proper posture and technique with exercises. Improved exercise technique, movement at target joints, use of target muscles after min to mod verbal, visual, tactile cues.      Patient tolerated progressive manual, strengthening and TDN. No pain relief reported by end of session despite interventions. Postural interventions demonstrate improved intrinsic correction with decreased need for external cueing. Left musculature of facial and cervical regions has increased muscle tension compared to right. She will benefit from skilled physical therapy to reduce pain, improve patient independence with symptom management, and improve quality of life               PT Education - 05/13/21 0806     Education provided Yes    Education Details manual, dry needling    Person(s) Educated  Patient    Methods Explanation;Demonstration;Tactile cues;Verbal cues    Comprehension Verbalized understanding;Returned demonstration;Verbal cues required;Tactile cues required              PT Short Term Goals - 04/01/21 1128       PT SHORT TERM GOAL #1   Title Pt will be independent with HEP in order to decrease pain in order to improve pain-free function at home and work.    Baseline 6/7: HEP given    Time 6    Period Weeks    Status New    Target Date 05/13/21               PT Long Term Goals - 04/01/21 1129       PT LONG TERM GOAL #1   Title Patient will increase FOTO score to equal to or greater than 59%    to demonstrate statistically significant improvement in mobility and quality  of life.    Baseline 6/7: 50%    Time 12    Period Weeks    Status New    Target Date 06/24/21      PT LONG TERM GOAL #2   Title Pt will decrease worst pain as reported on NPRS by at least 3 points (3/10) in order to demonstrate clinically significant reduction in pain.    Baseline 6/7: 6/10    Time 12    Period Weeks    Status New    Target Date 06/24/21      PT LONG TERM GOAL #3   Title Pt will demonstrate pain-free full range cervical motion in order to perform IADLs such as driving and household chores without increase in symptoms.    Baseline 6/7: see note for limitation    Time 12    Period Weeks    Status New    Target Date 06/24/21      PT LONG TERM GOAL #4   Title Patient will have less than two days a week of high pain migraines and demonstrate ability to self control/contain migraines.    Time 12    Period Weeks    Status New    Target Date 06/24/21                   Plan - 05/13/21 0811     Clinical Impression Statement Patient tolerated progressive manual, strengthening and TDN. No pain relief reported by end of session despite interventions. Postural interventions demonstrate improved intrinsic correction with decreased need for external  cueing. Left musculature of facial and cervical regions has increased muscle tension compared to right. She will benefit from skilled physical therapy to reduce pain, improve patient independence with symptom management, and improve quality of life    Personal Factors and Comorbidities Age;Behavior Pattern;Comorbidity 3+;Fitness;Past/Current Experience;Profession;Time since onset of injury/illness/exacerbation    Comorbidities HLD, aortic atherosclerosis, asthma, COVID 19, SOB, hypothyroidism, atypical facial pain (2019), neuropathy (2020), fibromyalgia    Examination-Activity Limitations Caring for Others;Carry;Lift;Sleep;Transfers    Examination-Participation Restrictions Cleaning;Community Activity;Interpersonal Relationship;Driving;Laundry;Shop;Occupation;Meal Prep;Yard Work    [employer] officer Evolving/Moderate complexity    Rehab Potential Fair    PT Frequency Biweekly    PT Duration 12 weeks    PT Treatment/Interventions ADLs/Self Care Home Management;Dry needling;Manual techniques;Therapeutic exercise;Therapeutic activities;Neuromuscular re-education;Aquatic Therapy;Biofeedback;Canalith Repostioning;Cryotherapy;Electrical Stimulation;Iontophoresis 4mg /ml Dexamethasone;Moist Heat;Ultrasound;Patient/family education;Passive range of motion;Energy conservation;Vestibular;Joint Manipulations;Traction;Gait training;Balance training;Taping;Spinal Manipulations;Visual/perceptual remediation/compensation    PT Next Visit Plan Perform trigger point dry needling and manual techniques until pt can tolerate active strengthening, additional chronic pain education    PT Home Exercise Plan see above    Consulted and Agree with Plan of Care Patient             Patient will benefit from skilled therapeutic intervention in order to improve the following deficits and impairments:  Impaired perceived functional ability, Impaired UE functional use, Pain, Decreased mobility, Decreased range  of motion, Decreased strength, Hypomobility, Impaired flexibility, Increased muscle spasms, Postural dysfunction, Improper body mechanics  Visit Diagnosis: Abnormal posture  Nonintractable headache, unspecified chronicity pattern, unspecified headache type  Chronic right shoulder pain     Problem List Patient Active Problem List   Diagnosis Date Noted   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 hauler 12/25/2020   Chronic urticaria 12/25/2020   Moderate persistent asthma, uncomplicated 83/15/1761   Ageusia 08/12/2020   Anosmia 08/12/2020   Myalgia, epidemic 08/12/2020   Cough with exposure to COVID-19 virus 08/12/2020   Olfactory impairment 05/29/2020  Acute UTI 04/23/2020   Left ureteral stone 04/23/2020   Intermittent chest pain 02/09/2020   History of COVID-19 02/09/2020   Shortness of breath 02/09/2020   Loss of taste 02/09/2020   Loss of smell 02/09/2020   Rash 02/09/2020   Arthralgia 02/09/2020   Postoperative history of checked last year 04/22/2017   Weakness of left arm 04/22/2017   Numbness and tingling in left arm 04/22/2017   Lower back pain 04/02/2016   Acute stress disorder 02/25/2015   Anxiety 02/25/2015   Airway hyperreactivity 02/25/2015   Bradycardia 02/25/2015   Hypersomnia 02/25/2015   Clinical depression 02/25/2015   Elevated blood sugar 02/25/2015   Fibrositis 02/25/2015   Acid reflux 02/25/2015   Hepatitis non A non B 02/25/2015   H/O disease 02/25/2015   Hypercholesteremia 02/25/2015   Headache, migraine 02/25/2015   Muscle ache 02/25/2015   Allergic rhinitis, seasonal 02/25/2015   Avitaminosis D 02/25/2015    Janna Arch, PT, DPT  05/13/2021, 8:13 AM  [place] MAIN [facility] SERVICES [address] [place], [place], [address] Phone: [phone]   Fax:  [phone]  Name: Rachel Fry MRN: [id number] Date of Birth: [birth year]

## 2021-05-14 ENCOUNTER — Other Ambulatory Visit (HOSPITAL_COMMUNITY): Payer: Self-pay

## 2021-05-15 ENCOUNTER — Other Ambulatory Visit (HOSPITAL_COMMUNITY): Payer: Self-pay

## 2021-05-15 MED FILL — Omalizumab For Inj 150 MG: SUBCUTANEOUS | 28 days supply | Qty: 2 | Fill #3 | Status: AC

## 2021-05-16 ENCOUNTER — Other Ambulatory Visit: Payer: Self-pay | Admitting: Internal Medicine

## 2021-05-16 ENCOUNTER — Other Ambulatory Visit: Payer: Self-pay

## 2021-05-16 ENCOUNTER — Ambulatory Visit (HOSPITAL_BASED_OUTPATIENT_CLINIC_OR_DEPARTMENT_OTHER): Payer: 59

## 2021-05-16 ENCOUNTER — Other Ambulatory Visit
Admission: RE | Admit: 2021-05-16 | Discharge: 2021-05-16 | Disposition: A | Discharge status: Home / Self Care | Payer: 59 | Attending: Internal Medicine | Admitting: Internal Medicine

## 2021-05-16 DIAGNOSIS — R0600 Dyspnea, unspecified: Secondary | ICD-10-CM | POA: Insufficient documentation

## 2021-05-16 DIAGNOSIS — R0602 Shortness of breath: Secondary | ICD-10-CM

## 2021-05-16 DIAGNOSIS — Z8616 Personal history of COVID-19: Secondary | ICD-10-CM | POA: Diagnosis not present

## 2021-05-16 LAB — BLOOD GAS, ARTERIAL
Acid-Base Excess: 1.8 mmol/L (ref 0.0–2.0)
Bicarbonate: 24.4 mmol/L (ref 20.0–28.0)
FIO2: 0.21
O2 Saturation: 98.7 %
Patient temperature: 37
pCO2 arterial: 32 mmHg (ref 32.0–48.0)
pH, Arterial: 7.49 — ABNORMAL HIGH (ref 7.350–7.450)
pO2, Arterial: 110 mmHg — ABNORMAL HIGH (ref 83.0–108.0)

## 2021-05-16 LAB — COOXEMETRY PANEL
Carboxyhemoglobin: 1.3 % (ref 0.5–1.5)
Methemoglobin: 0.3 % (ref 0.0–1.5)
O2 Saturation: 98.7 %
Total oxygen content: 98 mL/dL

## 2021-05-19 ENCOUNTER — Other Ambulatory Visit (HOSPITAL_COMMUNITY): Payer: Self-pay

## 2021-05-19 LAB — HGB FRACTIONATION CASCADE
Hgb A2: 2.2 % (ref 1.8–3.2)
Hgb A: 97.8 % (ref 96.4–98.8)
Hgb F: 0 % (ref 0.0–2.0)
Hgb S: 0 %

## 2021-05-19 LAB — MISC LABCORP TEST (SEND OUT): LabCorp test name: 121690

## 2021-05-20 ENCOUNTER — Other Ambulatory Visit: Payer: Self-pay

## 2021-05-20 ENCOUNTER — Ambulatory Visit (INDEPENDENT_AMBULATORY_CARE_PROVIDER_SITE_OTHER): Payer: 59 | Admitting: *Deleted

## 2021-05-20 DIAGNOSIS — L508 Other urticaria: Secondary | ICD-10-CM

## 2021-05-20 DIAGNOSIS — L501 Idiopathic urticaria: Secondary | ICD-10-CM | POA: Diagnosis not present

## 2021-05-26 ENCOUNTER — Other Ambulatory Visit (HOSPITAL_COMMUNITY): Payer: Self-pay

## 2021-05-26 MED ORDER — EZETIMIBE 10 MG PO TABS
10.0000 mg | ORAL_TABLET | Freq: Every day | ORAL | 3 refills | Status: DC
  Filled 2021-05-26: qty 90, 90d supply, fill #0

## 2021-05-26 MED ORDER — CARESTART COVID-19 HOME TEST VI KIT
PACK | 0 refills | Status: DC
  Filled 2021-05-26: qty 4, 4d supply, fill #0

## 2021-05-27 ENCOUNTER — Ambulatory Visit: Payer: 59

## 2021-05-28 ENCOUNTER — Other Ambulatory Visit (HOSPITAL_COMMUNITY): Payer: Self-pay

## 2021-05-29 ENCOUNTER — Other Ambulatory Visit (HOSPITAL_COMMUNITY): Payer: Self-pay

## 2021-05-30 ENCOUNTER — Other Ambulatory Visit (HOSPITAL_COMMUNITY): Payer: Self-pay

## 2021-06-02 ENCOUNTER — Other Ambulatory Visit (HOSPITAL_COMMUNITY): Payer: Self-pay

## 2021-06-03 ENCOUNTER — Other Ambulatory Visit (HOSPITAL_COMMUNITY): Payer: Self-pay

## 2021-06-04 ENCOUNTER — Other Ambulatory Visit: Payer: Self-pay

## 2021-06-04 ENCOUNTER — Ambulatory Visit: Payer: 59 | Attending: Internal Medicine

## 2021-06-04 ENCOUNTER — Other Ambulatory Visit (HOSPITAL_COMMUNITY): Payer: Self-pay

## 2021-06-04 DIAGNOSIS — R293 Abnormal posture: Secondary | ICD-10-CM | POA: Diagnosis not present

## 2021-06-04 DIAGNOSIS — R519 Headache, unspecified: Secondary | ICD-10-CM | POA: Insufficient documentation

## 2021-06-04 DIAGNOSIS — M542 Cervicalgia: Secondary | ICD-10-CM | POA: Diagnosis not present

## 2021-06-04 NOTE — Therapy (Signed)
[place] MAIN [facility] SERVICES [address] [place], [place], [address] Phone: [phone]   Fax:  [phone]  Physical Therapy Treatment  Patient Details  Name: Rachel Fry MRN: [id number] Date of Birth: Apr 28, 1956 Referring Provider (PT): Dr. Karlton Lemon   Encounter Date: 06/04/2021   PT End of Session - 06/04/21 1658     Visit Number 4    Number of Visits 7    Date for PT Re-Evaluation 06/24/21    Authorization Type 4/10 eval 6/7    PT Start Time 1610    PT Stop Time 1650    PT Time Calculation (min) 40 min    Activity Tolerance Patient tolerated treatment well    Behavior During Therapy WFL for tasks assessed/performed             Past Medical History:  Diagnosis Date   Allergy    Asthma    COVID-19    History of kidney stones    Hypothyroidism    Migraines    Numbness and tingling    left side of body, occasionally right side    S/P Botox injection    Urticaria     Past Surgical History:  Procedure Laterality Date   ADENOIDECTOMY     ARM NEUROPLASTY Right 1994 and 1996   for chronic pain   CESAREAN SECTION     CYSTOSCOPY W/ RETROGRADES Left 04/23/2020   Procedure: CYSTOSCOPY WITH RETROGRADE PYELOGRAM;  Surgeon: Abbie Sons, MD;  Location: [facility];  Service: Urology;  Laterality: Left;   CYSTOSCOPY W/ RETROGRADES Left 05/13/2020   Procedure: CYSTOSCOPY WITH RETROGRADE PYELOGRAM;  Surgeon: Hollice Espy, MD;  Location: [facility];  Service: Urology;  Laterality: Left;   CYSTOSCOPY WITH STENT PLACEMENT Left 04/23/2020   Procedure: CYSTOSCOPY WITH STENT PLACEMENT;  Surgeon: Abbie Sons, MD;  Location: [facility];  Service: Urology;  Laterality: Left;   CYSTOSCOPY/URETEROSCOPY/HOLMIUM LASER/STENT PLACEMENT Left 05/13/2020   Procedure: CYSTOSCOPY/URETEROSCOPY/LASER/STENT EXCHANGE;  Surgeon: Hollice Espy, MD;  Location: [facility];  Service: Urology;  Laterality: Left;   OVARIAN CYST REMOVAL  1973   STONE  EXTRACTION WITH BASKET  05/13/2020   Procedure: STONE EXTRACTION WITH BASKET;  Surgeon: Hollice Espy, MD;  Location: [facility];  Service: Urology;;   TONSILLECTOMY     TONSILLECTOMY AND ADENOIDECTOMY     TUBAL LIGATION  1988    There were no vitals filed for this visit.   Subjective Assessment - 06/04/21 1657     Subjective Patient missed last session due to therapist being out for personal emergency. Reports an increase in migraines since absent from therapy.    Pertinent History Patient is a 65 year old female who presents to physical therapy for migraines with specific referral for dry needling.  Patient has been having migraines for about 14-15 years. They have changed from front temporal all day long 2-3/10, a couple years ago started having tingling in left side of body, numbness in arm and leg. Additionally sometimes get's belly aches.  Changed medication in 2020. Was getting trigger injections but it was too expensive. PMH of HLD, aortic atherosclerosis, asthma, COVID 19, SOB, hypothyroidism, atypical facial pain (2019), neuropathy (2020), fibromyalgia    Limitations Reading;House hold activities;Other (comment)    How long can you sit comfortably? n/a    How long can you stand comfortably? n/a    How long can you walk comfortably? n/a    Diagnostic tests negative MRI for cervical, thoracic, brain  Patient Stated Goals to help manage symptoms    Currently in Pain? Yes    Pain Score 4     Pain Location Head    Pain Orientation Right;Left    Pain Descriptors / Indicators Aching;Headache    Pain Type Neuropathic pain;Chronic pain    Pain Onset More than a month ago                  Manual: Cervical side bend with overpressure to glenohumeral joint 5x 30 seconds Cervical rotation with overpressure to glenohumeral joint: 5x 30 seconds Suboccipital release 4x30 seconds J mobilization for postural kyphosis reduction 4x 30 seconds Thoracic mobilizations grade II for  reduced hypomobility x 4 minutes   Trigger Point Dry Needling (TDN), unbilled Education performed with patient regarding potential benefit of TDN. Reviewed precautions and risks with patient. Reviewed special precautions/risks over lung fields which include pneumothorax. Reviewed signs and symptoms of pneumothorax and advised pt to go to ER immediately if these symptoms develop advise them of dry needling treatment. Extensive time spent with pt to ensure full understanding of TDN risks. Pt provided verbal consent to treatment. TDN performed to  with 0.30 x 30 and .2 x 0.3 single needle placements with local twitch response (LTR). Pistoning technique utilized. Improved pain-free motion following intervention. L and Rcervical paraspinals, Upper trap (L and R), temporalis, and masseter bilaterally x 14 minutes      Postural education for reduced tension on cervical region.            Pt educated throughout session about proper posture and technique with exercises. Improved exercise technique, movement at target joints, use of target muscles after min to mod verbal, visual, tactile cues.    Patient initially arrived late to PT session but was kept late to ensure full therapeutic intervention. Multiple trigger points were found and released in bilateral upper trap with LUE more than RUE. Suboccipital release performed with no pain increase. Gentle progressive cervical ROM tolerated well with overpressure for reduced tension. She will benefit from skilled physical therapy to reduce pain, improve patient independence with symptom management, and improve quality of life                 PT Education - 06/04/21 1658     Education provided Yes    Education Details manual, TDN safety    Person(s) Educated Patient    Methods Explanation;Demonstration;Tactile cues;Verbal cues    Comprehension Verbalized understanding;Returned demonstration;Verbal cues required;Tactile cues required               PT Short Term Goals - 04/01/21 1128       PT SHORT TERM GOAL #1   Title Pt will be independent with HEP in order to decrease pain in order to improve pain-free function at home and work.    Baseline 6/7: HEP given    Time 6    Period Weeks    Status New    Target Date 05/13/21               PT Long Term Goals - 04/01/21 1129       PT LONG TERM GOAL #1   Title Patient will increase FOTO score to equal to or greater than 59%    to demonstrate statistically significant improvement in mobility and quality of life.    Baseline 6/7: 50%    Time 12    Period Weeks    Status New    Target Date 06/24/21  PT LONG TERM GOAL #2   Title Pt will decrease worst pain as reported on NPRS by at least 3 points (3/10) in order to demonstrate clinically significant reduction in pain.    Baseline 6/7: 6/10    Time 12    Period Weeks    Status New    Target Date 06/24/21      PT LONG TERM GOAL #3   Title Pt will demonstrate pain-free full range cervical motion in order to perform IADLs such as driving and household chores without increase in symptoms.    Baseline 6/7: see note for limitation    Time 12    Period Weeks    Status New    Target Date 06/24/21      PT LONG TERM GOAL #4   Title Patient will have less than two days a week of high pain migraines and demonstrate ability to self control/contain migraines.    Time 12    Period Weeks    Status New    Target Date 06/24/21                   Plan - 06/04/21 1705     Clinical Impression Statement Patient initially arrived late to PT session but was kept late to ensure full therapeutic intervention. Multiple trigger points were found and released in bilateral upper trap with LUE more than RUE. Suboccipital release performed with no pain increase. Gentle progressive cervical ROM tolerated well with overpressure for reduced tension. She will benefit from skilled physical therapy to reduce pain, improve patient  independence with symptom management, and improve quality of life    Personal Factors and Comorbidities Age;Behavior Pattern;Comorbidity 3+;Fitness;Past/Current Experience;Profession;Time since onset of injury/illness/exacerbation    Comorbidities HLD, aortic atherosclerosis, asthma, COVID 19, SOB, hypothyroidism, atypical facial pain (2019), neuropathy (2020), fibromyalgia    Examination-Activity Limitations Caring for Others;Carry;Lift;Sleep;Transfers    Examination-Participation Restrictions Cleaning;Community Activity;Interpersonal Relationship;Driving;Laundry;Shop;Occupation;Meal Prep;Yard Work    [employer] officer Evolving/Moderate complexity    Rehab Potential Fair    PT Frequency Biweekly    PT Duration 12 weeks    PT Treatment/Interventions ADLs/Self Care Home Management;Dry needling;Manual techniques;Therapeutic exercise;Therapeutic activities;Neuromuscular re-education;Aquatic Therapy;Biofeedback;Canalith Repostioning;Cryotherapy;Electrical Stimulation;Iontophoresis '4mg'$ /ml Dexamethasone;Moist Heat;Ultrasound;Patient/family education;Passive range of motion;Energy conservation;Vestibular;Joint Manipulations;Traction;Gait training;Balance training;Taping;Spinal Manipulations;Visual/perceptual remediation/compensation    PT Next Visit Plan Perform trigger point dry needling and manual techniques until pt can tolerate active strengthening, additional chronic pain education    PT Home Exercise Plan see above    Consulted and Agree with Plan of Care Patient             Patient will benefit from skilled therapeutic intervention in order to improve the following deficits and impairments:  Impaired perceived functional ability, Impaired UE functional use, Pain, Decreased mobility, Decreased range of motion, Decreased strength, Hypomobility, Impaired flexibility, Increased muscle spasms, Postural dysfunction, Improper body mechanics  Visit Diagnosis: Abnormal  posture  Nonintractable headache, unspecified chronicity pattern, unspecified headache type  Cervicalgia     Problem List Patient Active Problem List   Diagnosis Date Noted   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 hauler 12/25/2020   Chronic urticaria 12/25/2020   Moderate persistent asthma, uncomplicated 0000000   Ageusia 08/12/2020   Anosmia 08/12/2020   Myalgia, epidemic 08/12/2020   Cough with exposure to COVID-19 virus 08/12/2020   Olfactory impairment 05/29/2020   Acute UTI 04/23/2020   Left ureteral stone 04/23/2020   Intermittent chest pain 02/09/2020   History of COVID-19 02/09/2020   Shortness of breath 02/09/2020   Loss  of taste 02/09/2020   Loss of smell 02/09/2020   Rash 02/09/2020   Arthralgia 02/09/2020   Postoperative history of checked last year 04/22/2017   Weakness of left arm 04/22/2017   Numbness and tingling in left arm 04/22/2017   Lower back pain 04/02/2016   Acute stress disorder 02/25/2015   Anxiety 02/25/2015   Airway hyperreactivity 02/25/2015   Bradycardia 02/25/2015   Hypersomnia 02/25/2015   Clinical depression 02/25/2015   Elevated blood sugar 02/25/2015   Fibrositis 02/25/2015   Acid reflux 02/25/2015   Hepatitis non A non B 02/25/2015   H/O disease 02/25/2015   Hypercholesteremia 02/25/2015   Headache, migraine 02/25/2015   Muscle ache 02/25/2015   Allergic rhinitis, seasonal 02/25/2015   Avitaminosis D 02/25/2015    Janna Arch, PT, DPT  06/04/2021, 5:05 PM  [place] [facility] MAIN [facility] SERVICES [address] [place], [place], [address] Phone: [phone]   Fax:  [phone]  Name: Rachel Fry MRN: [id number] Date of Birth: 22-Jan-1956

## 2021-06-05 ENCOUNTER — Other Ambulatory Visit (HOSPITAL_COMMUNITY): Payer: Self-pay

## 2021-06-05 ENCOUNTER — Other Ambulatory Visit: Payer: Self-pay | Admitting: Allergy & Immunology

## 2021-06-05 MED ORDER — CLONIDINE HCL 0.1 MG PO TABS
0.1000 mg | ORAL_TABLET | Freq: Two times a day (BID) | ORAL | 3 refills | Status: DC
  Filled 2021-06-05 – 2021-06-25 (×2): qty 180, 90d supply, fill #0
  Filled 2021-10-19: qty 180, 90d supply, fill #1
  Filled 2021-12-13 – 2022-01-10 (×2): qty 180, 90d supply, fill #2
  Filled 2022-04-11: qty 180, 90d supply, fill #3

## 2021-06-05 MED FILL — Levothyroxine Sodium Tab 50 MCG: ORAL | 90 days supply | Qty: 90 | Fill #1 | Status: AC

## 2021-06-05 NOTE — Telephone Encounter (Signed)
Please advise to refill as last avs did not mention medications

## 2021-06-06 ENCOUNTER — Other Ambulatory Visit (HOSPITAL_COMMUNITY): Payer: Self-pay

## 2021-06-06 MED FILL — Zafirlukast Tab 10 MG: ORAL | 90 days supply | Qty: 180 | Fill #0 | Status: AC

## 2021-06-07 ENCOUNTER — Other Ambulatory Visit (HOSPITAL_COMMUNITY): Payer: Self-pay

## 2021-06-09 ENCOUNTER — Other Ambulatory Visit (HOSPITAL_COMMUNITY): Payer: Self-pay

## 2021-06-09 ENCOUNTER — Other Ambulatory Visit: Payer: Self-pay | Admitting: Allergy & Immunology

## 2021-06-09 ENCOUNTER — Ambulatory Visit
Admission: RE | Admit: 2021-06-09 | Discharge: 2021-06-09 | Disposition: A | Discharge status: Home / Self Care | Payer: 59 | Attending: Urology | Admitting: Urology

## 2021-06-09 ENCOUNTER — Other Ambulatory Visit: Payer: Self-pay | Admitting: *Deleted

## 2021-06-09 ENCOUNTER — Ambulatory Visit
Admission: RE | Admit: 2021-06-09 | Discharge: 2021-06-09 | Disposition: A | Discharge status: Home / Self Care | Payer: 59 | Source: Ambulatory Visit | Attending: Urology | Admitting: Urology

## 2021-06-09 DIAGNOSIS — Z87442 Personal history of urinary calculi: Secondary | ICD-10-CM | POA: Diagnosis not present

## 2021-06-09 DIAGNOSIS — I878 Other specified disorders of veins: Secondary | ICD-10-CM | POA: Diagnosis not present

## 2021-06-09 DIAGNOSIS — N201 Calculus of ureter: Secondary | ICD-10-CM

## 2021-06-09 NOTE — Progress Notes (Signed)
06/10/21 10:39 AM   Rachel Fry [birth year] [id number]  Referring provider:  [name], MD [address] [place],  [place] [address] Chief Complaint  Patient presents with   Nephrolithiasis     HPI: Rachel Fry is a 65 y.o.female who has ha history of kidney stones and gross hematuria who returns today for a 1 year follow-up with KUB.   She is s/p left ureteroscopy, retrograde pyelogram, ureteral stent removal 05/13/2020.  Stone composition analysis on 05/13/2020 showed 90% calcium oxalate dihydrate, 10% calcium oxalate monohydrate.  She also underwent ureteroscopy in 2015 for an acute stone event.  KUB today showed no evidence of stones.   She states she is doing well but has been struggling with constipation. She takes Miralax. Her migraine medicine can have a side effect of constipation.   PMH: Past Medical History:  Diagnosis Date   Allergy    Asthma    COVID-19    History of kidney stones    Hypothyroidism    Migraines    Numbness and tingling    left side of body, occasionally right side    S/P Botox injection    Urticaria     Surgical History: Past Surgical History:  Procedure Laterality Date   ADENOIDECTOMY     ARM NEUROPLASTY Right 1994 and 1996   for chronic pain   CESAREAN SECTION     CYSTOSCOPY W/ RETROGRADES Left 04/23/2020   Procedure: CYSTOSCOPY WITH RETROGRADE PYELOGRAM;  Surgeon: Abbie Sons, MD;  Location: [facility];  Service: Urology;  Laterality: Left;   CYSTOSCOPY W/ RETROGRADES Left 05/13/2020   Procedure: CYSTOSCOPY WITH RETROGRADE PYELOGRAM;  Surgeon: Hollice Espy, MD;  Location: [facility];  Service: Urology;  Laterality: Left;   CYSTOSCOPY WITH STENT PLACEMENT Left 04/23/2020   Procedure: CYSTOSCOPY WITH STENT PLACEMENT;  Surgeon: Abbie Sons, MD;  Location: [facility];  Service: Urology;  Laterality: Left;   CYSTOSCOPY/URETEROSCOPY/HOLMIUM LASER/STENT PLACEMENT Left 05/13/2020   Procedure:  CYSTOSCOPY/URETEROSCOPY/LASER/STENT EXCHANGE;  Surgeon: Hollice Espy, MD;  Location: [facility];  Service: Urology;  Laterality: Left;   OVARIAN CYST REMOVAL  1973   STONE EXTRACTION WITH BASKET  05/13/2020   Procedure: STONE EXTRACTION WITH BASKET;  Surgeon: Hollice Espy, MD;  Location: [facility];  Service: Urology;;   TONSILLECTOMY     TONSILLECTOMY AND ADENOIDECTOMY     TUBAL LIGATION  1988    Home Medications:  Allergies as of 06/10/2021       Reactions   Flonase [fluticasone Propionate] Other (See Comments)   The scent in flonase causes wheezing    Latex Itching   Fish Oil Itching   Percocet [oxycodone-acetaminophen] Other (See Comments)   Reports her pharmacist told her never to take this medication because of her codeine allergy   Sulfa Antibiotics    abdominal pain   Codeine Nausea And Vomiting, Rash   Tolerates tramadol         Medication List        Accurate as of June 10, 2021 10:39 AM. If you have any questions, ask your nurse or doctor.          STOP taking these medications    Breo Ellipta 200-25 MCG/INH Aepb Generic drug: fluticasone furoate-vilanterol Stopped by: Hollice Espy, MD   frovatriptan 2.5 MG tablet Commonly known as: FROVA Stopped by: Hollice Espy, MD       TAKE these medications    Aimovig 140 MG/ML Soaj Generic drug: Erenumab-aooe Inject  1  Pen into the skin every 30 days (Inject  1 Pen into the skin every 30 days)   albuterol 108 (90 Base) MCG/ACT inhaler Commonly known as: VENTOLIN HFA INHALE 2 PUFFS INTO THE LUNGS EVERY 4 (FOUR) HOURS AS NEEDED FOR WHEEZING OR SHORTNESS OF BREATH.   atorvastatin 20 MG tablet Commonly known as: LIPITOR Take 1 tablet (20 mg total) by mouth daily.   b complex vitamins capsule Take 1 capsule by mouth daily.   botulinum toxin Type A 100 units Solr injection Commonly known as: BOTOX Botox - Historical Medication  once every 12 weeks for migraines Active   Botox 200 units  Solr Generic drug: Botulinum Toxin Type A Inject up to 200 units into the muscle by provider to head, neck, and shoulders every 3 months (discard any unused portion)   Carestart COVID-19 Home Test Kit Generic drug: COVID-19 At Home Antigen Test Use as directed within package instructions.   cetirizine 10 MG tablet Commonly known as: ZYRTEC TAKE 1 TABLET (10 MG TOTAL) BY MOUTH EVERY EVENING.   cloNIDine 0.1 MG tablet Commonly known as: CATAPRES TAKE 1 TABLET BY MOUTH TWICE DAILY What changed: Another medication with the same name was removed. Continue taking this medication, and follow the directions you see here. Changed by: Hollice Espy, MD   EPINEPHrine 0.3 mg/0.3 mL Soaj injection Commonly known as: EPI-PEN Inject 0.3 mg into the muscle as needed for anaphylaxis.   ezetimibe 10 MG tablet Commonly known as: ZETIA TAKE 1 TABLET BY MOUTH ONCE DAILY What changed: Another medication with the same name was removed. Continue taking this medication, and follow the directions you see here. Changed by: Hollice Espy, MD   famotidine 20 MG tablet Commonly known as: PEPCID Take 20 mg by mouth 2 (two) times daily.   Flovent HFA 110 MCG/ACT inhaler Generic drug: fluticasone Inhale 2 puffs into the lungs 2 (two) times daily.   levothyroxine 50 MCG tablet Commonly known as: SYNTHROID TAKE 1 TABLET BY MOUTH ONCE DAILY   Nurtec 75 MG Tbdp Generic drug: Rimegepant Sulfate DISSOLVE 1 TABLET BY MOUTH ONCE A DAY AS NEEDED FOR MIGRAINE   Probiotic 1-250 BILLION-MG Caps Probiotic  Take 1 cap po daily   rizatriptan 10 MG tablet Commonly known as: MAXALT Take 10 mg by mouth 2 (two) times daily as needed for migraine. Max doses of 3 doses per 7 days   spironolactone 50 MG tablet Commonly known as: ALDACTONE TAKE 1 TABLET BY MOUTH TWICE DAILY.   SYSTANE OP Place 1-2 drops into both eyes 2 (two) times daily.   torsemide 20 MG tablet Commonly known as: DEMADEX Take 20 mg by mouth  daily as needed.   triamcinolone 55 MCG/ACT Aero nasal inhaler Commonly known as: NASACORT Place 2 sprays into the nose at bedtime.   triamcinolone cream 0.1 % Commonly known as: KENALOG Apply twice daily to rash up to 2 weeks/month as needed.   Vitamin D (Ergocalciferol) 1.25 MG (50000 UNIT) Caps capsule Commonly known as: DRISDOL TAKE 1 CAPSULE BY MOUTH EVERY WEEK   VITAMIN K2 PO Take by mouth.   Xolair 150 MG injection Generic drug: omalizumab INJECT 300 MG INTO THE SKIN EVERY 28 DAYS.   zafirlukast 10 MG tablet Commonly known as: ACCOLATE Take 1 tablet (10 mg total) by mouth 2 (two) times daily.   zoledronic acid 5 MG/100ML Soln injection Commonly known as: RECLAST Inject 5 mg into the vein as directed. Once a year.  Allergies:  Allergies  Allergen Reactions   Flonase [Fluticasone Propionate] Other (See Comments)    The scent in flonase causes wheezing    Latex Itching   Fish Oil Itching   Percocet [Oxycodone-Acetaminophen] Other (See Comments)    Reports her pharmacist told her never to take this medication because of her codeine allergy   Sulfa Antibiotics     abdominal pain   Codeine Nausea And Vomiting and Rash    Tolerates tramadol     Family History: Family History  Problem Relation Age of Onset   Hypertension Mother    Breast cancer Mother 34   Thyroid disease Mother    Stroke Mother    Dementia Mother    Cardiomyopathy Father    Peripheral Artery Disease Father    Hyperlipidemia Brother    Hypertension Brother    Hypertension Brother    Hyperlipidemia Brother    Thyroid disease Brother    Hypertension Brother    Birth defects Brother    Congenital heart disease Brother    Cancer Brother        Kidney cancer   Colon cancer Maternal Grandmother    Stroke Paternal Grandfather    Neuropathy Neg Hx    Multiple sclerosis Neg Hx    Migraines Neg Hx     Social History:  reports that she has never smoked. She has never used smokeless  tobacco. She reports that she does not drink alcohol and does not use drugs.   Physical Exam: BP 131/89   Pulse 72   Ht 5' 6" (1.676 m)   Wt 164 lb (74.4 kg)   BMI 26.47 kg/m   Constitutional:  Alert and oriented, No acute distress. HEENT: [place] AT, moist mucus membranes.  Trachea midline, no masses. Cardiovascular: No clubbing, cyanosis, or edema. Respiratory: Normal respiratory effort, no increased work of breathing. Skin: No rashes, bruises or suspicious lesions. Neurologic: Grossly intact, no focal deficits, moving all 4 extremities. Psychiatric: Normal mood and affect.  Laboratory Data:  Lab Results  Component Value Date   CREATININE 0.98 04/01/2021    Lab Results  Component Value Date   HGBA1C 5.4 12/10/2016     Pertinent Imaging:  KUB today showed no evidence of kidney stones.   She does have copious amount of stool burden in her rectum.  I have personally reviewed the images and agree with radiologist interpretation.    Assessment & Plan:    History of kidney stones  - KUB today was unremarkable with no evidence of kidney stones  -Reviewed stone diet/behavioral recommendations again today  Constipation  -Evidence of significant constipation on KUB today, admits last BM was over 3 days ago -Currently taking MiraLAX but recommended considering bowel cleanout with either enema or magnesium citrate as tolerated.   Follow-up in 1 year with KUB.   I,Kailey Littlejohn,acting as a Education administrator for Hollice Espy, MD.,have documented all relevant documentation on the behalf of Hollice Espy, MD,as directed by  Hollice Espy, MD while in the presence of Hollice Espy, MD.  I have reviewed the above documentation for accuracy and completeness, and I agree with the above.   Hollice Espy, MD  [facility] [address] [place], [place] [address] [phone]

## 2021-06-09 NOTE — Telephone Encounter (Signed)
Please advise 

## 2021-06-10 ENCOUNTER — Other Ambulatory Visit (HOSPITAL_COMMUNITY): Payer: Self-pay

## 2021-06-10 ENCOUNTER — Ambulatory Visit (INDEPENDENT_AMBULATORY_CARE_PROVIDER_SITE_OTHER): Payer: 59 | Admitting: Urology

## 2021-06-10 ENCOUNTER — Other Ambulatory Visit: Payer: Self-pay

## 2021-06-10 VITALS — BP 131/89 | HR 72 | Ht 66.0 in | Wt 164.0 lb

## 2021-06-10 DIAGNOSIS — K5909 Other constipation: Secondary | ICD-10-CM

## 2021-06-10 DIAGNOSIS — Z87442 Personal history of urinary calculi: Secondary | ICD-10-CM

## 2021-06-10 MED FILL — Omalizumab For Inj 150 MG: SUBCUTANEOUS | Qty: 2 | Fill #0 | Status: CN

## 2021-06-11 ENCOUNTER — Ambulatory Visit: Payer: 59 | Attending: Internal Medicine | Admitting: Pharmacist

## 2021-06-11 ENCOUNTER — Other Ambulatory Visit (HOSPITAL_COMMUNITY): Payer: Self-pay

## 2021-06-11 DIAGNOSIS — Z79899 Other long term (current) drug therapy: Secondary | ICD-10-CM

## 2021-06-11 DIAGNOSIS — E619 Deficiency of nutrient element, unspecified: Secondary | ICD-10-CM | POA: Diagnosis not present

## 2021-06-11 DIAGNOSIS — L309 Dermatitis, unspecified: Secondary | ICD-10-CM | POA: Diagnosis not present

## 2021-06-11 DIAGNOSIS — R5383 Other fatigue: Secondary | ICD-10-CM | POA: Diagnosis not present

## 2021-06-11 MED ORDER — XOLAIR 150 MG ~~LOC~~ SOLR
SUBCUTANEOUS | 11 refills | Status: DC
  Filled 2021-06-11: qty 2, fill #0
  Filled 2021-06-12 – 2021-06-20 (×6): qty 2, 28d supply, fill #0
  Filled 2021-07-16: qty 2, 28d supply, fill #1
  Filled 2021-09-04: qty 2, 28d supply, fill #2
  Filled 2021-09-30: qty 2, 28d supply, fill #3
  Filled 2021-10-31: qty 2, 28d supply, fill #4
  Filled 2021-12-01 (×2): qty 2, 28d supply, fill #5
  Filled 2021-12-29: qty 2, 28d supply, fill #6
  Filled 2022-01-22: qty 2, 28d supply, fill #7
  Filled 2022-02-19: qty 2, 28d supply, fill #8
  Filled 2022-03-18: qty 2, 28d supply, fill #9
  Filled 2022-04-17: qty 2, 28d supply, fill #10
  Filled 2022-05-12: qty 2, 28d supply, fill #11

## 2021-06-11 NOTE — Progress Notes (Signed)
   S: Patient presents for review of their specialty medication therapy.  Patient is currently taking Xolair for urticaria. Patient is managed by Dr. Ernst Bowler for this.   Adherence: confirmed  Efficacy: reports that, although not at 100%, the medication appears to be working  Dosing: Give subcutaneously.    Asthma: Chronic idiopathic urticaria: SubQ: 150 or 300 mg every 4 weeks. Dosing is not dependent on serum IgE (free or total) level or body weight.  Dose adjustments: Renal: no dose adjustments  Hepatic: no dose adjustments  Toxicity: Severe hypersensitivity reaction or anaphylaxis: Discontinue treatment. Fever, arthralgia, and rash: Discontinue treatment if this constellation of symptoms occurs.  Drug-drug interactions: no interactions identified   Monitoring: CV effects: none Eosinophilia and vasculitis: none Fever/arthralgia/rash: none Hypersensitivity/Anaphylaxis: none Malignant neoplasms: none  O:  Lab Results  Component Value Date   WBC 10.3 08/16/2020   HGB 14.1 08/16/2020   HCT 43.1 08/16/2020   MCV 94.2 08/16/2020   PLT 235.0 08/16/2020      Chemistry      Component Value Date/Time   NA 135 04/01/2021 1656   NA 146 (H) 02/09/2020 1019   NA 139 08/29/2014 1112   K 3.8 04/01/2021 1656   K 4.3 08/29/2014 1112   CL 102 04/01/2021 1656   CL 103 08/29/2014 1112   CO2 26 04/01/2021 1656   CO2 33 (H) 08/29/2014 1112   BUN 16 04/01/2021 1656   BUN 11 02/09/2020 1019   BUN 17 08/29/2014 1112   CREATININE 0.98 04/01/2021 1656   CREATININE 0.98 08/29/2014 1112   GLU 83 03/30/2014 0000      Component Value Date/Time   CALCIUM 10.1 04/01/2021 1656   CALCIUM 9.3 08/29/2014 1112   ALKPHOS 80 02/09/2020 1019   ALKPHOS 69 08/29/2014 1112   AST 12 02/09/2020 1019   AST 19 08/29/2014 1112   ALT 14 02/09/2020 1019   ALT 23 08/29/2014 1112   BILITOT 0.4 02/09/2020 1019   BILITOT 0.3 08/29/2014 1112       A/P: 1. Medication review: patient currently  on Xolair for urticaria and is tolerating it well. Reviewed the medication with the patient, including the following: Xolair, omalizumab, is a novel IgE blocker.  It appears to reduce rates of hospitalizations, ER visits and unscheduled physician visits due asthma exacerbations when added to standard therapy.  Studies also show a reduction in steroid requirements and improvement in quality of life.  Patient educated on purpose, proper use and potential adverse effects of Xolair.  Following instruction patient verbalized understanding. Patient should always have an EpiPen readily available in the event of anaphylaxis. SubQ: For SubQ injection only; doses >150 mg should be divided over more than one injection site (eg, 225 mg or 300 mg administered as two injections, 375 mg administered as three injections); each injection site should be separated by ?1 inch. Do not inject into moles, scars, bruises, tender areas, or broken skin. Injections may take 5 to 10 seconds to administer (solution is slightly viscous). Administer only under direct medical supervision and observe patient for 2 hours after the first 3 injections and 30 minutes after subsequent injections Dellia Cloud 2015) or in accordance with individual institution policies and procedures.No recommendations for any changes at this time.   Benard Halsted, PharmD, [facility] [place], [place] [phone]

## 2021-06-12 ENCOUNTER — Other Ambulatory Visit (HOSPITAL_COMMUNITY): Payer: Self-pay

## 2021-06-12 ENCOUNTER — Telehealth: Payer: Self-pay

## 2021-06-12 ENCOUNTER — Ambulatory Visit: Payer: 59

## 2021-06-12 ENCOUNTER — Other Ambulatory Visit: Payer: Self-pay

## 2021-06-12 DIAGNOSIS — R293 Abnormal posture: Secondary | ICD-10-CM | POA: Diagnosis not present

## 2021-06-12 DIAGNOSIS — R519 Headache, unspecified: Secondary | ICD-10-CM | POA: Diagnosis not present

## 2021-06-12 DIAGNOSIS — M542 Cervicalgia: Secondary | ICD-10-CM | POA: Diagnosis not present

## 2021-06-12 NOTE — Therapy (Signed)
[place] MAIN [facility] [address] [place], [place], [address] Phone: [phone]   Fax:  [phone]  Physical Therapy Treatment  Patient Details  Name: Rachel Fry MRN: [id number] Date of Birth: 1956-02-11 Referring Provider (PT): Dr. Karlton Lemon   Encounter Date: 06/12/2021   PT End of Session - 06/12/21 1043     Visit Number 5    Number of Visits 7    Date for PT Re-Evaluation 06/24/21    Authorization Type 5/10 eval 6/7    PT Start Time 0715    PT Stop Time 0758    PT Time Calculation (min) 43 min    Activity Tolerance Patient tolerated treatment well    Behavior During Therapy WFL for tasks assessed/performed             Past Medical History:  Diagnosis Date   Allergy    Asthma    COVID-19    History of kidney stones    Hypothyroidism    Migraines    Numbness and tingling    left side of body, occasionally right side    S/P Botox injection    Urticaria     Past Surgical History:  Procedure Laterality Date   ADENOIDECTOMY     ARM NEUROPLASTY Right 1994 and 1996   for chronic pain   CESAREAN SECTION     CYSTOSCOPY W/ RETROGRADES Left 04/23/2020   Procedure: CYSTOSCOPY WITH RETROGRADE PYELOGRAM;  Surgeon: Abbie Sons, MD;  Location: [facility];  Service: Urology;  Laterality: Left;   CYSTOSCOPY W/ RETROGRADES Left 05/13/2020   Procedure: CYSTOSCOPY WITH RETROGRADE PYELOGRAM;  Surgeon: Hollice Espy, MD;  Location: [facility];  Service: Urology;  Laterality: Left;   CYSTOSCOPY WITH STENT PLACEMENT Left 04/23/2020   Procedure: CYSTOSCOPY WITH STENT PLACEMENT;  Surgeon: Abbie Sons, MD;  Location: [facility];  Service: Urology;  Laterality: Left;   CYSTOSCOPY/URETEROSCOPY/HOLMIUM LASER/STENT PLACEMENT Left 05/13/2020   Procedure: CYSTOSCOPY/URETEROSCOPY/LASER/STENT EXCHANGE;  Surgeon: Hollice Espy, MD;  Location: [facility];  Service: Urology;  Laterality: Left;   OVARIAN CYST REMOVAL  1973   STONE  EXTRACTION WITH BASKET  05/13/2020   Procedure: STONE EXTRACTION WITH BASKET;  Surgeon: Hollice Espy, MD;  Location: [facility];  Service: Urology;;   TONSILLECTOMY     TONSILLECTOMY AND ADENOIDECTOMY     TUBAL LIGATION  1988    There were no vitals filed for this visit.   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 Is leaving to visit her daughter in 4 weeks.    Pertinent History Patient is a 65 year old female who presents to physical therapy for migraines with specific referral for dry needling.  Patient has been having migraines for about 14-15 years. They have changed from front temporal all day long 2-3/10, a couple years ago started having tingling in left side of body, numbness in arm and leg. Additionally sometimes get's belly aches.  Changed medication in 2020. Was getting trigger injections but it was too expensive. PMH of HLD, aortic atherosclerosis, asthma, COVID 19, SOB, hypothyroidism, atypical facial pain (2019), neuropathy (2020), fibromyalgia    Limitations Reading;House hold activities;Other (comment)    How long can you sit comfortably? n/a    How long can you stand comfortably? n/a    How long can you walk comfortably? n/a    Diagnostic tests negative MRI for cervical, thoracic, brain  Patient Stated Goals to help manage symptoms    Currently in Pain? Yes    Pain Score 3     Pain Location Head    Pain Orientation Right;Left    Pain Descriptors / Indicators Aching    Pain Type Neuropathic pain    Pain Onset More than a month ago    Pain Frequency Intermittent              Patient had dental work done since last session. Had to have her mouth open for a long period time and is sore.        Manual: Cervical side bend with overpressure to glenohumeral joint 5x 30 seconds Cervical rotation with overpressure to glenohumeral joint: 5x 30 seconds Suboccipital  release 4x30 seconds J mobilization for postural kyphosis reduction 4x 30 seconds Thoracic mobilizations grade II for reduced hypomobility x 5 minutes cervical mobilizations grade II for reduced hypomobility x 5 minutes   Trigger Point Dry Needling (TDN), unbilled Education performed with patient regarding potential benefit of TDN. Reviewed precautions and risks with patient. Reviewed special precautions/risks over lung fields which include pneumothorax. Reviewed signs and symptoms of pneumothorax and advised pt to go to ER immediately if these symptoms develop advise them of dry needling treatment. Extensive time spent with pt to ensure full understanding of TDN risks. Pt provided verbal consent to treatment. TDN performed to  with 0.30 x 30 and .2 x 0.3 single needle placements with local twitch response (LTR). Pistoning technique utilized. Improved pain-free motion following intervention. L and Rcervical paraspinals, Upper trap (L and R), temporalis, and masseter bilaterally x 14 minutes       Postural education for reduced tension on cervical region.            Pt educated throughout session about proper posture and technique with exercises. Improved exercise technique, movement at target joints, use of target muscles after min to mod verbal, visual, tactile cues.    Patient presents to physical therapy with increased trigger points in masseter bilaterally. Multiple trigger points released in facial musculature however less are present this session in upper traps indicating carryover since last session. Gentle progressive cervical ROM muscle tissue lengthening performed with patient tolerating well. She will benefit from skilled physical therapy to reduce pain, improve patient independence with symptom management, and improve quality of life              PT Education - 06/12/21 1042     Education provided Yes    Education Details manual, TDN    Person(s) Educated Patient     Methods Explanation;Demonstration;Tactile cues;Verbal cues    Comprehension Verbalized understanding;Returned demonstration;Verbal cues required;Tactile cues required              PT Short Term Goals - 04/01/21 1128       PT SHORT TERM GOAL #1   Title Pt will be independent with HEP in order to decrease pain in order to improve pain-free function at home and work.    Baseline 6/7: HEP given    Time 6    Period Weeks    Status New    Target Date 05/13/21               PT Long Term Goals - 04/01/21 1129       PT LONG TERM GOAL #1   Title Patient will increase FOTO score to equal to or greater than 59%    to demonstrate statistically significant improvement in  mobility and quality of life.    Baseline 6/7: 50%    Time 12    Period Weeks    Status New    Target Date 06/24/21      PT LONG TERM GOAL #2   Title Pt will decrease worst pain as reported on NPRS by at least 3 points (3/10) in order to demonstrate clinically significant reduction in pain.    Baseline 6/7: 6/10    Time 12    Period Weeks    Status New    Target Date 06/24/21      PT LONG TERM GOAL #3   Title Pt will demonstrate pain-free full range cervical motion in order to perform IADLs such as driving and household chores without increase in symptoms.    Baseline 6/7: see note for limitation    Time 12    Period Weeks    Status New    Target Date 06/24/21      PT LONG TERM GOAL #4   Title Patient will have less than two days a week of high pain migraines and demonstrate ability to self control/contain migraines.    Time 12    Period Weeks    Status New    Target Date 06/24/21                   Plan - 06/12/21 1045     Clinical Impression Statement Patient presents to physical therapy with increased trigger points in masseter bilaterally. Multiple trigger points released in facial musculature however less are present this session in upper traps indicating carryover since last session.  Gentle progressive cervical ROM muscle tissue lengthening performed with patient tolerating well. She will benefit from skilled physical therapy to reduce pain, improve patient independence with symptom management, and improve quality of life    Personal Factors and Comorbidities Age;Behavior Pattern;Comorbidity 3+;Fitness;Past/Current Experience;Profession;Time since onset of injury/illness/exacerbation    Comorbidities HLD, aortic atherosclerosis, asthma, COVID 19, SOB, hypothyroidism, atypical facial pain (2019), neuropathy (2020), fibromyalgia    Examination-Activity Limitations Caring for Others;Carry;Lift;Sleep;Transfers    Examination-Participation Restrictions Cleaning;Community Activity;Interpersonal Relationship;Driving;Laundry;Shop;Occupation;Meal Prep;Yard Work    [employer] Evolving/Moderate complexity    Rehab Potential Fair    PT Frequency Biweekly    PT Duration 12 weeks    PT Treatment/Interventions ADLs/Self Care Home Management;Dry needling;Manual techniques;Therapeutic exercise;Therapeutic activities;Neuromuscular re-education;Aquatic Therapy;Biofeedback;Canalith Repostioning;Cryotherapy;Electrical Stimulation;Iontophoresis '4mg'$ /ml Dexamethasone;Moist Heat;Ultrasound;Patient/family education;Passive range of motion;Energy conservation;Vestibular;Joint Manipulations;Traction;Gait training;Balance training;Taping;Spinal Manipulations;Visual/perceptual remediation/compensation    PT Next Visit Plan Perform trigger point dry needling and manual techniques until pt can tolerate active strengthening, additional chronic pain education    PT Home Exercise Plan see above    Consulted and Agree with Plan of Care Patient             Patient will benefit from skilled therapeutic intervention in order to improve the following deficits and impairments:  Impaired perceived functional ability, Impaired UE functional use, Pain, Decreased mobility, Decreased range of  motion, Decreased strength, Hypomobility, Impaired flexibility, Increased muscle spasms, Postural dysfunction, Improper body mechanics  Visit Diagnosis: Abnormal posture  Nonintractable headache, unspecified chronicity pattern, unspecified headache type  Cervicalgia     Problem List Patient Active Problem List   Diagnosis Date Noted   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 hauler 12/25/2020   Chronic urticaria 12/25/2020   Moderate persistent asthma, uncomplicated 0000000   Ageusia 08/12/2020   Anosmia 08/12/2020   Myalgia, epidemic 08/12/2020   Cough with exposure to COVID-19 virus 08/12/2020   Olfactory impairment 05/29/2020  Acute UTI 04/23/2020   Left ureteral stone 04/23/2020   Intermittent chest pain 02/09/2020   History of COVID-19 02/09/2020   Shortness of breath 02/09/2020   Loss of taste 02/09/2020   Loss of smell 02/09/2020   Rash 02/09/2020   Arthralgia 02/09/2020   Postoperative history of checked last year 04/22/2017   Weakness of left arm 04/22/2017   Numbness and tingling in left arm 04/22/2017   Lower back pain 04/02/2016   Acute stress disorder 02/25/2015   Anxiety 02/25/2015   Airway hyperreactivity 02/25/2015   Bradycardia 02/25/2015   Hypersomnia 02/25/2015   Clinical depression 02/25/2015   Elevated blood sugar 02/25/2015   Fibrositis 02/25/2015   Acid reflux 02/25/2015   Hepatitis non A non B 02/25/2015   H/O disease 02/25/2015   Hypercholesteremia 02/25/2015   Headache, migraine 02/25/2015   Muscle ache 02/25/2015   Allergic rhinitis, seasonal 02/25/2015   Avitaminosis D 02/25/2015    Janna Arch, PT, DPT  06/12/2021, 10:46 AM  [place] [facility] MAIN [facility] SERVICES [address] [place], [place], [address] Phone: [phone]   Fax:  [phone]  Name: Rachel Fry MRN: [id number] Date of Birth: 07-Feb-1956

## 2021-06-12 NOTE — Telephone Encounter (Signed)
The pharmacy called to let our office know they sent over a prior authorization today 06/12/21 for Xolair. Patient has an appointment on 06/17/21.

## 2021-06-13 ENCOUNTER — Ambulatory Visit: Payer: 59 | Attending: Internal Medicine

## 2021-06-13 ENCOUNTER — Other Ambulatory Visit: Payer: Self-pay

## 2021-06-13 ENCOUNTER — Other Ambulatory Visit (HOSPITAL_COMMUNITY): Payer: Self-pay

## 2021-06-13 DIAGNOSIS — Z23 Encounter for immunization: Secondary | ICD-10-CM

## 2021-06-13 MED ORDER — PFIZER-BIONT COVID-19 VAC-TRIS 30 MCG/0.3ML IM SUSP
INTRAMUSCULAR | 0 refills | Status: DC
  Filled 2021-06-13: qty 0.3, 1d supply, fill #0

## 2021-06-13 NOTE — Progress Notes (Signed)
   Covid-19 Vaccination Clinic  Name:  HENZLEY LAFAUCI    MRN: [id number] DOB: [birth year]  06/13/2021  Ms. Mccrumb was observed post Covid-19 immunization for 30 minutes based on pre-vaccination screening without incident. She was provided with Vaccine Information Sheet and instruction to access the V-Safe system.   Ms. Argudo was instructed to call 911 with any severe reactions post vaccine: Difficulty breathing  Swelling of face and throat  A fast heartbeat  A bad rash all over body  Dizziness and weakness   Immunizations Administered     Name Date Dose VIS Date Route   PFIZER Comrnaty(Gray TOP) Covid-19 Vaccine 06/13/2021  2:23 PM 0.3 mL 10/03/2020 Intramuscular   Manufacturer: [employer]   Lot: [id number]   NDC: Waves, PharmD, MBA Clinical Acute Care Pharmacist

## 2021-06-16 ENCOUNTER — Other Ambulatory Visit (HOSPITAL_COMMUNITY): Payer: Self-pay

## 2021-06-17 ENCOUNTER — Ambulatory Visit: Payer: 59

## 2021-06-17 ENCOUNTER — Other Ambulatory Visit (HOSPITAL_COMMUNITY): Payer: Self-pay

## 2021-06-18 ENCOUNTER — Other Ambulatory Visit: Payer: Self-pay

## 2021-06-18 ENCOUNTER — Ambulatory Visit (INDEPENDENT_AMBULATORY_CARE_PROVIDER_SITE_OTHER): Payer: 59 | Admitting: Neurology

## 2021-06-18 VITALS — BP 138/79 | HR 86 | Ht 66.0 in | Wt 170.0 lb

## 2021-06-18 DIAGNOSIS — R29898 Other symptoms and signs involving the musculoskeletal system: Secondary | ICD-10-CM | POA: Diagnosis not present

## 2021-06-18 DIAGNOSIS — R208 Other disturbances of skin sensation: Secondary | ICD-10-CM

## 2021-06-18 NOTE — Progress Notes (Signed)
Provider:  Larey Seat, M D  Referring Provider: Lowell Guitar, MD Primary Care Physician:  [name], MD  Chief Complaint  Patient presents with   New Patient (Initial Visit)    RM 10, alone. Paper referral from [name], MD for left thigh numbness. Sx started about 2 months or more ago. Tingling starts in head and radiates down left side in the past, though it was migraines. Numbness in left thigh is various in severity. Sometimes feels like something is running down her leg like water. Denies any injuries.     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  Portable pulse ox's were available for home use, she had a nonproductive cough.  She lost smell and taste sensation within the first week of illness.  She has taken Aimovig for migraines and she had some 'relapse". Face and arm and leg were  having a buzzing , feeling. Mostly on the left side. Leg felt heavy and numb.    Now she noted she had a numbness in the left thigh, it has continued since onset for 4 months ago.  The patient had seen Dr. Lavell Fry long before the point of COVID pandemic, and at that time nerve conduction studies and EMGs did not give any evidence of cause for her left thigh numbness.  She reports that is every day presents to consistent finding and over the last 4 months it has actually worsened.  Again the onset time she linked with the prolonged waiting for an Aimovig  dose.         She has had ongoing symptoms since including a rash, and abnormal feeling in her mouth as if her lips are puffy, she did see cardiology for shortness of breath had a work-up including echo and EKG.  She has seen dermatology for the rash evaluation.  She has a history of asthma and states that her shortness of breath has been ongoing unrelenting since she was diagnosed with Covid so this has been for a full year now.  Symptoms are not improving any further and she states that her oxygen sats have been staying stable but she feels short of breath and it is more severe after exertion.  She had a spirometry test at the primary care office this was supposedly normal results.  She is still having ongoing ageusia and anosmia.  The patient has followed up post Covid clinic since April of this year, and upon her insistence she has finally seen on the above named specialists in referral, she had an CT of the chest with special attention to the lung given her symptoms of shortness of breath.  This reportedly was negative. Dr.  Ardis Fry.   FINDINGS: Cardiovascular: Normal heart size. No significant pericardial effusion/thickening. Atherosclerotic nonaneurysmal thoracic aorta. Normal caliber pulmonary arteries.  Mediastinum/Nodes: No discrete thyroid nodules. Unremarkable esophagus. No pathologically enlarged axillary, mediastinal or hilar lymph nodes, noting  limited sensitivity for the detection of hilar adenopathy on this noncontrast study.  Lungs/Pleura: No pneumothorax. No pleural effusion. No acute consolidative airspace disease, lung masses or significant pulmonary nodules. No significant air trapping or evidence of tracheobronchomalacia on the expiration sequence. No significant regions of subpleural reticulation, ground-glass attenuation, parenchymal banding, traction bronchiectasis, architectural distortion or frank honeycombing.  Upper abdomen: Small hiatal  hernia.  Musculoskeletal: No aggressive appearing focal osseous lesions. Moderate thoracic spondylosis. Mild superior T10 vertebral compression fracture, chronic and unchanged since 04/19/2020 CT.  IMPRESSION: 1. No evidence of interstitial lung disease. No evidence of postinflammatory fibrosis. 2. Small hiatal hernia. 3. Chronic mild T10 vertebral compression fracture. 4. Aortic Atherosclerosis (ICD10-I70.0).   Electronically Signed By: Rachel Fry M.D. On: 08/09/2020 14:45   Review of Systems: Out of a complete 14 system review, the patient complains of only the following symptoms, and all other reviewed systems are negative.  SOB without infiltrate.  Relapsing migraines While waiting for an overdue dose mid-June-  Frova and Nurtec prescribed by Dr Ninfa Meeker, [place].   Plan is she is mostly felt impaired by a lack of taste sensation, she stated that the smell never seem to have completely gone away.  The measuring today 5 different qualities of smell;  Peppermint-  Could smell but not taste the oil. Vanilla         - no taste or smell. Goes for florals.  Rum- Coffee          - no smell and no taste.  Coconut          -no smell or taste sensation reported. Citrus-             Smells something ( citrus!) and can taste like soap.    Social History   Socioeconomic History   Marital status: Married    Spouse name: Rachel Fry   Number of children: 3   Years of education: 16   Highest education level: Not on file  Occupational History   Occupation: Cardiab rehab  Tobacco Use   Smoking status: Never   Smokeless tobacco: Never  Vaping Use   Vaping Use: Never used  Substance and Sexual Activity   Alcohol use: No   Drug use: No   Sexual activity: Not on file  Other Topics Concern   Not on file  Social History Narrative   Lives with husband   Caffeine use: none   Left handed   Social Determinants of Health   Financial Resource Strain: Not on file  Food Insecurity: Not on  file  Transportation Needs: Not on file  Physical Activity: Not on file  Stress: Not on file  Social Connections: Not on file  Intimate Partner Violence: Not on file    Family History  Problem Relation Age of Onset   Hypertension Mother    Breast cancer Mother 10   Thyroid disease Mother    Stroke Mother    Dementia Mother    Cardiomyopathy Father    Peripheral Artery Disease Father    Hyperlipidemia Brother    Hypertension Brother    Hypertension Brother    Hyperlipidemia Brother    Thyroid disease Brother    Hypertension Brother    Birth defects Brother    Congenital heart disease Brother    Cancer Brother        Kidney cancer   Colon cancer Maternal Grandmother    Stroke Paternal Grandfather    Neuropathy Neg Hx    Multiple sclerosis  Neg Hx    Migraines Neg Hx     Past Medical History:  Diagnosis Date   Allergy    Asthma    COVID-19    History of kidney stones    Hypothyroidism    Migraines    Numbness and tingling    left side of body, occasionally right side    S/P Botox injection    Urticaria     Past Surgical History:  Procedure Laterality Date   ADENOIDECTOMY     ARM NEUROPLASTY Right 1994 and 1996   for chronic pain   CESAREAN SECTION     CYSTOSCOPY W/ RETROGRADES Left 04/23/2020   Procedure: CYSTOSCOPY WITH RETROGRADE PYELOGRAM;  Surgeon: Abbie Sons, MD;  Location: [facility];  Service: Urology;  Laterality: Left;   CYSTOSCOPY W/ RETROGRADES Left 05/13/2020   Procedure: CYSTOSCOPY WITH RETROGRADE PYELOGRAM;  Surgeon: Hollice Espy, MD;  Location: [facility];  Service: Urology;  Laterality: Left;   CYSTOSCOPY WITH STENT PLACEMENT Left 04/23/2020   Procedure: CYSTOSCOPY WITH STENT PLACEMENT;  Surgeon: Abbie Sons, MD;  Location: [facility];  Service: Urology;  Laterality: Left;   CYSTOSCOPY/URETEROSCOPY/HOLMIUM LASER/STENT PLACEMENT Left 05/13/2020   Procedure: CYSTOSCOPY/URETEROSCOPY/LASER/STENT EXCHANGE;  Surgeon: Hollice Espy, MD;   Location: [facility];  Service: Urology;  Laterality: Left;   OVARIAN CYST REMOVAL  1973   STONE EXTRACTION WITH BASKET  05/13/2020   Procedure: STONE EXTRACTION WITH BASKET;  Surgeon: Hollice Espy, MD;  Location: [facility];  Service: Urology;;   TONSILLECTOMY     TONSILLECTOMY AND ADENOIDECTOMY     TUBAL LIGATION  1988    Current Outpatient Medications  Medication Sig Dispense Refill   albuterol (VENTOLIN HFA) 108 (90 Base) MCG/ACT inhaler INHALE 2 PUFFS INTO THE LUNGS EVERY 4 (FOUR) HOURS AS NEEDED FOR WHEEZING OR SHORTNESS OF BREATH. 18 g 1   atorvastatin (LIPITOR) 20 MG tablet Take 1 tablet (20 mg total) by mouth daily. 90 tablet 1   b complex vitamins capsule Take 1 capsule by mouth daily.     Bacillus Coagulans-Inulin (PROBIOTIC) 1-250 BILLION-MG CAPS Probiotic  Take 1 cap po daily     Botulinum Toxin Type A (BOTOX) 200 units SOLR Inject up to 200 units into the muscle by provider to head, neck, and shoulders every 3 months (discard any unused portion) 1 each 3   cetirizine (ZYRTEC) 10 MG tablet TAKE 1 TABLET (10 MG TOTAL) BY MOUTH EVERY EVENING. 30 tablet 5   cloNIDine (CATAPRES) 0.1 MG tablet TAKE 1 TABLET BY MOUTH TWICE DAILY 180 tablet 3   COVID-19 At Home Antigen Test (CARESTART COVID-19 HOME TEST) KIT Use as directed within package instructions. 4 each 0   COVID-19 mRNA Vac-TriS, Pfizer, (PFIZER-BIONT COVID-19 VAC-TRIS) SUSP injection Inject into the muscle. 0.3 mL 0   EPINEPHrine 0.3 mg/0.3 mL IJ SOAJ injection Inject 0.3 mg into the muscle as needed for anaphylaxis.   1   Erenumab-aooe (AIMOVIG) 140 MG/ML SOAJ Inject  1 Pen into the skin every 30 days 1 mL 3   famotidine (PEPCID) 20 MG tablet Take 20 mg by mouth 2 (two) times daily.     fluticasone (FLOVENT HFA) 110 MCG/ACT inhaler Inhale 2 puffs into the lungs 2 (two) times daily. 12 g 5   frovatriptan (FROVA) 2.5 MG tablet Take 2.5 mg by mouth as needed for migraine. If recurs, may repeat after 2 hours. Max of 3 tabs in 24  hours.     levothyroxine (SYNTHROID) 50 MCG tablet TAKE  1 TABLET BY MOUTH ONCE DAILY 90 tablet 3   Menaquinone-7 (VITAMIN K2 PO) Take by mouth.     omalizumab (XOLAIR) 150 MG injection INJECT 300 MG INTO THE SKIN EVERY 28 DAYS. 2 each 11   Polyethyl Glycol-Propyl Glycol (SYSTANE OP) Place 1-2 drops into both eyes 2 (two) times daily.     Rimegepant Sulfate 75 MG TBDP DISSOLVE 1 TABLET BY MOUTH ONCE A DAY AS NEEDED FOR MIGRAINE 8 tablet 5   spironolactone (ALDACTONE) 50 MG tablet TAKE 1 TABLET BY MOUTH TWICE DAILY. 180 tablet 2   torsemide (DEMADEX) 20 MG tablet Take 20 mg by mouth daily as needed.     triamcinolone (NASACORT) 55 MCG/ACT AERO nasal inhaler Place 2 sprays into the nose at bedtime.     triamcinolone cream (KENALOG) 0.1 % Apply twice daily to rash up to 2 weeks/month as needed. 80 g 0   Vitamin D, Ergocalciferol, (DRISDOL) 1.25 MG (50000 UNIT) CAPS capsule TAKE 1 CAPSULE BY MOUTH EVERY WEEK 12 capsule 3   zafirlukast (ACCOLATE) 10 MG tablet Take 1 tablet (10 mg total) by mouth 2 (two) times daily. 180 tablet 3   zoledronic acid (RECLAST) 5 MG/100ML SOLN injection Inject 5 mg into the vein as directed. Once a year.     ezetimibe (ZETIA) 10 MG tablet TAKE 1 TABLET BY MOUTH ONCE DAILY 90 tablet 3   Current Facility-Administered Medications  Medication Dose Route Frequency Provider Last Rate Last Admin   omalizumab Arvid Right) injection 300 mg  300 mg Subcutaneous Q28 days Valentina Shaggy, MD   300 mg at 05/20/21 1626    Allergies as of 06/18/2021 - Review Complete 06/18/2021  Allergen Reaction Noted   Flonase [fluticasone propionate] Other (See Comments)    Latex     Fish oil Itching 02/25/2015   Percocet [oxycodone-acetaminophen] Other (See Comments) 05/13/2020   Sulfa antibiotics  02/25/2015   Codeine Nausea And Vomiting and Rash 02/25/2015    Vitals: BP 138/79 (BP Location: Left Arm, Patient Position: Sitting, Cuff Size: Normal)   Pulse 86   Ht '5\' 6"'  (1.676 m)   Wt  170 lb (77.1 kg)   BMI 27.44 kg/m  Last Weight:  Wt Readings from Last 1 Encounters:  06/18/21 170 lb (77.1 kg)   Last Height:   Ht Readings from Last 1 Encounters:  06/18/21 '5\' 6"'  (1.676 m)    Physical exam:  General: The patient is awake, alert and appears not in acute distress. The patient is well groomed. Head: Normocephalic, atraumatic. Neck is supple.  Cardiovascular:  Regular rate and rhythm, without  murmurs or carotid bruit, and without distended neck veins. Respiratory: Lungs are clear to auscultation. Skin:  Without evidence of edema, or rash Trunk: BMI is 29.45 and patient  has normal posture.  Neurologic exam : The patient is awake and alert, oriented to place and time.   Memory subjective described as impaired -  Trouble to remember employee number, ICD codes that are used all the time. Not so much names.There is a normal attention span & concentration ability.  Speech is fluent without  dysarthria, dysphonia or aphasia. Mood and affect are appropriate.  Cranial nerves: Pupils are equal and briskly reactive to light. Funduscopic exam without evidence of pallor or edema. Extraocular movements  in vertical and horizontal planes intact and without nystagmus.  Visual fields by finger perimetry are intact. Hearing to finger rub intact.  Facial sensation intact to fine touch. Facial motor strength is symmetric and  tongue and uvula move midline. Tongue protrusion into either cheek is normal. Shoulder shrug is normal.   Motor exam:   Normal tone ,muscle bulk and symmetric strength in all extremities.  Sensory:  Tingling in extremities and face. Fine touch, pinprick and vibration were tested in all extremities. Proprioception was normal. She feels as if all sensation is slightly decreased on the left leg, thigh.  New is a persistent tingling on top pf the left thigh.   Coordination: Rapid alternating movements in the fingers/hands were normal.  Finger-to-nose maneuver   normal without evidence of ataxia, dysmetria or tremor.  Gait and station: Patient walks without assistive device and is able unassisted to climb up to the exam table. Strength within normal limits. Stance is stable and normal. Tandem gait is unfragmented. Deep tendon reflexes: in the  upper and lower extremities are symmetric and intact. Babinski maneuver response is  downgoing.   Assessment:  After physical and neurologic examination, review of laboratory studies, imaging, neurophysiology testing and pre-existing records, assessment is that of :  1 ) unexplained dyseasthesias in the left thigh  , now  symptoms have become continuous , a finding over the last 4 month.  2) other dyseasthesias are still present but have lower intensity.   3) chronic urticaria for over a year, treated with Dr Ernst Bowler  by monoclonal AB.      MRI 2019 and 2020 brain were normal. C spine was with mild DDD, otherwise normal. Had EEGs and bradycardia under Atenolol.   Plan; Lumbal MRI non contrast- repeat. Lets look at sensory NCV, subjective weakness in left leg.  EMG. Left hand dominant and reporting weakness in her left hand. No trigger identified.  Not optimistic that we can find a cause.   Asencion Partridge Geremiah Fussell MD 06/18/2021

## 2021-06-19 ENCOUNTER — Other Ambulatory Visit (HOSPITAL_COMMUNITY): Payer: Self-pay

## 2021-06-20 ENCOUNTER — Other Ambulatory Visit (HOSPITAL_COMMUNITY): Payer: Self-pay

## 2021-06-20 ENCOUNTER — Telehealth: Payer: Self-pay | Admitting: *Deleted

## 2021-06-20 NOTE — Telephone Encounter (Signed)
Called patient and advised that her Arvid Right has been reapproved and should reach out to [facility] to order

## 2021-06-23 ENCOUNTER — Telehealth: Payer: Self-pay | Admitting: Neurology

## 2021-06-23 ENCOUNTER — Other Ambulatory Visit (HOSPITAL_COMMUNITY): Payer: Self-pay

## 2021-06-23 NOTE — Telephone Encounter (Signed)
[facility] no auth require faxed to [facility] they will reach out to the patient to schedule. Ph # [phone] fax [phone].

## 2021-06-25 ENCOUNTER — Other Ambulatory Visit (HOSPITAL_COMMUNITY): Payer: Self-pay

## 2021-06-26 ENCOUNTER — Other Ambulatory Visit (HOSPITAL_COMMUNITY): Payer: Self-pay

## 2021-06-26 ENCOUNTER — Ambulatory Visit: Payer: 59 | Attending: Internal Medicine

## 2021-06-26 DIAGNOSIS — M542 Cervicalgia: Secondary | ICD-10-CM | POA: Diagnosis not present

## 2021-06-26 DIAGNOSIS — R293 Abnormal posture: Secondary | ICD-10-CM | POA: Diagnosis not present

## 2021-06-26 DIAGNOSIS — R519 Headache, unspecified: Secondary | ICD-10-CM | POA: Diagnosis not present

## 2021-06-26 MED ORDER — VITAMIN D (ERGOCALCIFEROL) 1.25 MG (50000 UNIT) PO CAPS
50000.0000 [IU] | ORAL_CAPSULE | ORAL | 3 refills | Status: DC
  Filled 2021-06-26: qty 12, 84d supply, fill #0
  Filled 2022-03-06: qty 12, 84d supply, fill #1

## 2021-06-26 NOTE — Therapy (Signed)
[place] MAIN [facility] SERVICES [address] [place], [place], [address] Phone: [phone]   Fax:  [phone]  Physical Therapy Treatment/RECERT  Patient Details  Name: Rachel Fry MRN: [id number] Date of Birth: 01-25-1956 Referring Provider (PT): Dr. Karlton Lemon   Encounter Date: 06/26/2021   PT End of Session - 06/26/21 0801     Visit Number 6    Number of Visits 12    Date for PT Re-Evaluation 09/18/21    Authorization Type 6/10 eval 6/7    PT Start Time 0709    PT Stop Time 0755    PT Time Calculation (min) 46 min    Activity Tolerance Patient tolerated treatment well    Behavior During Therapy [facility] for tasks assessed/performed             Past Medical History:  Diagnosis Date   Allergy    Asthma    COVID-19    History of kidney stones    Hypothyroidism    Migraines    Numbness and tingling    left side of body, occasionally right side    S/P Botox injection    Urticaria     Past Surgical History:  Procedure Laterality Date   ADENOIDECTOMY     ARM NEUROPLASTY Right 1994 and 1996   for chronic pain   CESAREAN SECTION     CYSTOSCOPY W/ RETROGRADES Left 04/23/2020   Procedure: CYSTOSCOPY WITH RETROGRADE PYELOGRAM;  Surgeon: Abbie Sons, MD;  Location: [facility];  Service: Urology;  Laterality: Left;   CYSTOSCOPY W/ RETROGRADES Left 05/13/2020   Procedure: CYSTOSCOPY WITH RETROGRADE PYELOGRAM;  Surgeon: Hollice Espy, MD;  Location: [facility];  Service: Urology;  Laterality: Left;   CYSTOSCOPY WITH STENT PLACEMENT Left 04/23/2020   Procedure: CYSTOSCOPY WITH STENT PLACEMENT;  Surgeon: Abbie Sons, MD;  Location: [facility];  Service: Urology;  Laterality: Left;   CYSTOSCOPY/URETEROSCOPY/HOLMIUM LASER/STENT PLACEMENT Left 05/13/2020   Procedure: CYSTOSCOPY/URETEROSCOPY/LASER/STENT EXCHANGE;  Surgeon: Hollice Espy, MD;  Location: [facility];  Service: Urology;  Laterality: Left;   OVARIAN CYST REMOVAL  1973   STONE  EXTRACTION WITH BASKET  05/13/2020   Procedure: STONE EXTRACTION WITH BASKET;  Surgeon: Hollice Espy, MD;  Location: [facility];  Service: Urology;;   TONSILLECTOMY     TONSILLECTOMY AND ADENOIDECTOMY     TUBAL LIGATION  1988    There were no vitals filed for this visit.   Subjective Assessment - 06/26/21 0722     Subjective Patient reports no falls or LOB. Has been receiving botox every month. Will be going to [place] soon to visit her daughter/family.    Pertinent History Patient is a 65 year old female who presents to physical therapy for migraines with specific referral for dry needling.  Patient has been having migraines for about 14-15 years. They have changed from front temporal all day long 2-3/10, a couple years ago started having tingling in left side of body, numbness in arm and leg. Additionally sometimes get's belly aches.  Changed medication in 2020. Was getting trigger injections but it was too expensive. PMH of HLD, aortic atherosclerosis, asthma, COVID 19, SOB, hypothyroidism, atypical facial pain (2019), neuropathy (2020), fibromyalgia    Limitations Reading;House hold activities;Other (comment)    How long can you sit comfortably? n/a    How long can you stand comfortably? n/a    How long can you walk comfortably? n/a    Diagnostic tests negative MRI for cervical, thoracic, brain  Patient Stated Goals to help manage symptoms    Currently in Pain? Yes    Pain Score 2     Pain Location Head    Pain Orientation Right;Left    Pain Descriptors / Indicators Aching    Pain Type Neuropathic pain    Pain Onset More than a month ago    Pain Frequency Intermittent                     Goals:  HEP : compliant  FOTO: 59%  VAS worst pain: 4/10 in past two weeks/month Cervical ROM    Right Left  Flexion 54  Extension 60  Side Bending 35 30  Rotation 51 62    Less than 2 days a week of high pain migraines: every day getting the mild ones, 2x/week for  high pain migraines.    Treatment:    Manual: Suboccipital release 4x30 seconds cervical mobilizations grade II for reduced hypomobility 3x 15 seconds each level    Trigger Point Dry Needling (TDN), unbilled Education performed with patient regarding potential benefit of TDN. Reviewed precautions and risks with patient. Reviewed special precautions/risks over lung fields which include pneumothorax. Reviewed signs and symptoms of pneumothorax and advised pt to go to ER immediately if these symptoms develop advise them of dry needling treatment. Extensive time spent with pt to ensure full understanding of TDN risks. Pt provided verbal consent to treatment. TDN performed to  with 0.30 x 30 and .2 x 0.3 single needle placements with local twitch response (LTR). Pistoning technique utilized. Improved pain-free motion following intervention. L and Rcervical paraspinals, Upper trap (L and R), suboccipital  x 14 minutes       Postural education for reduced tension on cervical region.            Pt educated throughout session about proper posture and technique with exercises. Improved exercise technique, movement at target joints, use of target muscles after min to mod verbal, visual, tactile cues.   Patient demonstrates excellent progression towards functional goals this session. Her migraines are less severe and less frequent. Her cervical ROM is improving with continued limitations in lateral movements. L upper trap continues to be highly involved relying extensive needling. Patient continues to have excellent prognosis at this time. She will benefit from skilled physical therapy to reduce pain, improve patient independence with symptom management, and improve quality of life                 PT Education - 06/26/21 0800     Education provided Yes    Education Details exercise technique, body mechanics    Person(s) Educated Patient    Methods Explanation;Demonstration;Tactile  cues;Verbal cues    Comprehension Verbalized understanding;Returned demonstration;Verbal cues required;Tactile cues required              PT Short Term Goals - 06/26/21 0806       PT SHORT TERM GOAL #1   Title Pt will be independent with HEP in order to decrease pain in order to improve pain-free function at home and work.    Baseline 6/7: HEP given 9/1: HEp compliant    Time 6    Period Weeks    Status Partially Met    Target Date 08/07/21               PT Long Term Goals - 06/26/21 0803       PT LONG TERM GOAL #1   Title Patient  will increase FOTO score to equal to or greater than 59%    to demonstrate statistically significant improvement in mobility and quality of life.    Baseline 6/7: 50% 9/1: 59%    Time 12    Period Weeks    Status Partially Met    Target Date 09/18/21      PT LONG TERM GOAL #2   Title Pt will decrease worst pain as reported on NPRS by at least 3 points (3/10) in order to demonstrate clinically significant reduction in pain.    Baseline 6/7: 6/10 9/1: 4/10    Time 12    Period Weeks    Status Partially Met    Target Date 09/18/21      PT LONG TERM GOAL #3   Title Pt will demonstrate pain-free full range cervical motion in order to perform IADLs such as driving and household chores without increase in symptoms.    Baseline 6/7: see note for limitation 9/1:flexion 54, extension 60, SB R 35 L 30, rotation R 51 L 62    Time 12    Period Weeks    Status Partially Met    Target Date 09/18/21      PT LONG TERM GOAL #4   Title Patient will have less than two days a week of high pain migraines and demonstrate ability to self control/contain migraines.    Baseline 9/1:every day getting the mild ones, 2x/week for high pain migraines.    Time 12    Period Weeks    Status Partially Met    Target Date 09/18/21                   Plan - 06/26/21 0808     Clinical Impression Statement Patient demonstrates excellent progression towards  functional goals this session. Her migraines are less severe and less frequent. Her cervical ROM is improving with continued limitations in lateral movements. L upper trap continues to be highly involved relying extensive needling. Patient continues to have excellent prognosis at this time. She will benefit from skilled physical therapy to reduce pain, improve patient independence with symptom management, and improve quality of life    Personal Factors and Comorbidities Age;Behavior Pattern;Comorbidity 3+;Fitness;Past/Current Experience;Profession;Time since onset of injury/illness/exacerbation    Comorbidities HLD, aortic atherosclerosis, asthma, COVID 19, SOB, hypothyroidism, atypical facial pain (2019), neuropathy (2020), fibromyalgia    Examination-Activity Limitations Caring for Others;Carry;Lift;Sleep;Transfers    Examination-Participation Restrictions Cleaning;Community Activity;Interpersonal Relationship;Driving;Laundry;Shop;Occupation;Meal Prep;Yard Work    [employer] Evolving/Moderate complexity    Rehab Potential Fair    PT Frequency Biweekly    PT Duration 12 weeks    PT Treatment/Interventions ADLs/Self Care Home Management;Dry needling;Manual techniques;Therapeutic exercise;Therapeutic activities;Neuromuscular re-education;Aquatic Therapy;Biofeedback;Canalith Repostioning;Cryotherapy;Electrical Stimulation;Iontophoresis 55m/ml Dexamethasone;Moist Heat;Ultrasound;Patient/family education;Passive range of motion;Energy conservation;Vestibular;Joint Manipulations;Traction;Gait training;Balance training;Taping;Spinal Manipulations;Visual/perceptual remediation/compensation    PT Next Visit Plan Perform trigger point dry needling and manual techniques until pt can tolerate active strengthening, additional chronic pain education    PT Home Exercise Plan see above    Consulted and Agree with Plan of Care Patient             Patient will benefit from skilled  therapeutic intervention in order to improve the following deficits and impairments:  Impaired perceived functional ability, Impaired UE functional use, Pain, Decreased mobility, Decreased range of motion, Decreased strength, Hypomobility, Impaired flexibility, Increased muscle spasms, Postural dysfunction, Improper body mechanics  Visit Diagnosis: Abnormal posture  Nonintractable headache, unspecified chronicity pattern, unspecified headache type  Cervicalgia  Problem List Patient Active Problem List   Diagnosis Date Noted   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 hauler 12/25/2020   Chronic urticaria 12/25/2020   Moderate persistent asthma, uncomplicated 37/07/6268   Ageusia 08/12/2020   Anosmia 08/12/2020   Myalgia, epidemic 08/12/2020   Cough with exposure to COVID-19 virus 08/12/2020   Olfactory impairment 05/29/2020   Acute UTI 04/23/2020   Left ureteral stone 04/23/2020   Intermittent chest pain 02/09/2020   History of COVID-19 02/09/2020   Shortness of breath 02/09/2020   Loss of taste 02/09/2020   Loss of smell 02/09/2020   Rash 02/09/2020   Arthralgia 02/09/2020   Postoperative history of checked last year 04/22/2017   Weakness of left arm 04/22/2017   Numbness and tingling in left arm 04/22/2017   Lower back pain 04/02/2016   Acute stress disorder 02/25/2015   Anxiety 02/25/2015   Airway hyperreactivity 02/25/2015   Bradycardia 02/25/2015   Hypersomnia 02/25/2015   Clinical depression 02/25/2015   Elevated blood sugar 02/25/2015   Fibrositis 02/25/2015   Acid reflux 02/25/2015   Hepatitis non A non B 02/25/2015   H/O disease 02/25/2015   Hypercholesteremia 02/25/2015   Headache, migraine 02/25/2015   Muscle ache 02/25/2015   Allergic rhinitis, seasonal 02/25/2015   Avitaminosis D 02/25/2015    Janna Arch, PT, DPT  06/26/2021, 8:10 AM   [facility] MAIN [facility] SERVICES [address] [place], [place], [address] Phone: [phone]   Fax:   [phone]  Name: ANUSHRI CASALINO MRN: [id number] Date of Birth: 12/08/1955

## 2021-06-28 ENCOUNTER — Other Ambulatory Visit (HOSPITAL_COMMUNITY): Payer: Self-pay

## 2021-06-28 MED ORDER — VITAMIN D (ERGOCALCIFEROL) 1.25 MG (50000 UNIT) PO CAPS
50000.0000 [IU] | ORAL_CAPSULE | ORAL | 3 refills | Status: DC
  Filled 2021-06-28 – 2022-06-02 (×2): qty 12, 84d supply, fill #0

## 2021-07-02 ENCOUNTER — Other Ambulatory Visit: Payer: Self-pay

## 2021-07-02 ENCOUNTER — Other Ambulatory Visit (HOSPITAL_COMMUNITY): Payer: Self-pay

## 2021-07-02 ENCOUNTER — Ambulatory Visit
Admission: RE | Admit: 2021-07-02 | Discharge: 2021-07-02 | Disposition: A | Discharge status: Home / Self Care | Payer: 59 | Source: Ambulatory Visit | Attending: Neurology | Admitting: Neurology

## 2021-07-02 DIAGNOSIS — R208 Other disturbances of skin sensation: Secondary | ICD-10-CM | POA: Diagnosis not present

## 2021-07-02 DIAGNOSIS — M545 Low back pain, unspecified: Secondary | ICD-10-CM | POA: Diagnosis not present

## 2021-07-02 MED ORDER — FROVATRIPTAN SUCCINATE 2.5 MG PO TABS
ORAL_TABLET | ORAL | 5 refills | Status: DC
  Filled 2021-07-02: qty 9, 30d supply, fill #0
  Filled 2021-09-28: qty 9, 30d supply, fill #1
  Filled 2022-01-19: qty 9, 30d supply, fill #2
  Filled 2022-03-06: qty 9, 30d supply, fill #3
  Filled 2022-04-03: qty 9, 30d supply, fill #4
  Filled 2022-05-01: qty 9, 30d supply, fill #5

## 2021-07-02 MED ORDER — NURTEC 75 MG PO TBDP
ORAL_TABLET | ORAL | 5 refills | Status: DC
  Filled 2021-07-02: qty 8, 30d supply, fill #0
  Filled 2021-07-03: qty 16, 60d supply, fill #0
  Filled 2021-09-28: qty 16, 60d supply, fill #1
  Filled 2022-01-19: qty 16, 30d supply, fill #2

## 2021-07-02 MED ORDER — VITAMIN D (ERGOCALCIFEROL) 1.25 MG (50000 UNIT) PO CAPS
50000.0000 [IU] | ORAL_CAPSULE | ORAL | 3 refills | Status: DC
  Filled 2021-07-02: qty 12, 90d supply, fill #0
  Filled 2021-09-22: qty 12, 84d supply, fill #0
  Filled 2021-12-13: qty 12, 84d supply, fill #1
  Filled 2022-03-08: qty 12, 84d supply, fill #2

## 2021-07-03 ENCOUNTER — Encounter: Payer: Self-pay | Admitting: Allergy & Immunology

## 2021-07-03 ENCOUNTER — Other Ambulatory Visit (HOSPITAL_COMMUNITY): Payer: Self-pay

## 2021-07-03 ENCOUNTER — Ambulatory Visit: Payer: 59

## 2021-07-03 ENCOUNTER — Ambulatory Visit (INDEPENDENT_AMBULATORY_CARE_PROVIDER_SITE_OTHER): Payer: 59 | Admitting: Allergy & Immunology

## 2021-07-03 VITALS — BP 138/90 | HR 82 | Temp 97.6°F | Resp 16 | Ht 65.0 in | Wt 168.2 lb

## 2021-07-03 DIAGNOSIS — L501 Idiopathic urticaria: Secondary | ICD-10-CM | POA: Diagnosis not present

## 2021-07-03 DIAGNOSIS — J4540 Moderate persistent asthma, uncomplicated: Secondary | ICD-10-CM | POA: Diagnosis not present

## 2021-07-03 DIAGNOSIS — U099 Post covid-19 condition, unspecified: Secondary | ICD-10-CM

## 2021-07-03 MED ORDER — AIMOVIG 140 MG/ML ~~LOC~~ SOAJ
SUBCUTANEOUS | 11 refills | Status: DC
  Filled 2021-07-16: qty 1, 30d supply, fill #0
  Filled 2021-09-02: qty 1, 30d supply, fill #1
  Filled 2021-09-28: qty 1, 30d supply, fill #2
  Filled 2021-10-23: qty 1, 30d supply, fill #3
  Filled 2021-11-25: qty 1, 30d supply, fill #4
  Filled 2021-12-22: qty 1, 30d supply, fill #5
  Filled 2022-01-19 – 2022-02-20 (×3): qty 1, 30d supply, fill #6
  Filled 2022-04-03: qty 1, 30d supply, fill #7
  Filled 2022-04-26: qty 1, 30d supply, fill #8
  Filled 2022-05-26: qty 1, 30d supply, fill #9

## 2021-07-03 MED ORDER — AIMOVIG 140 MG/ML ~~LOC~~ SOAJ
SUBCUTANEOUS | 0 refills | Status: DC
  Filled 2021-07-03: qty 1, 30d supply, fill #0

## 2021-07-03 NOTE — Patient Instructions (Addendum)
1. Post COVID syndrome with urticarial rash - Continue with Xolair but we will work on getting this approved every two weeks.  - Prednisone pack provided to have on hand if you get in trouble in [place].  - Continue with cetirizine '10mg'$  at night (you can add on a morning dose if you want to see if that helps).   2. Moderate persistent asthma, uncomplicated - Lung testing looked slightly worse than that obtained in October 2021. - We will add on Spiriva to see how you feel with that (samples provided - 2 puffs once daily).  - Daily controller medication(s): Flovent 158mg 2 puffs twice daily with spacer and Accolade twice daily - Prior to physical activity: albuterol 2 puffs 10-15 minutes before physical activity. - Rescue medications: albuterol 4 puffs every 4-6 hours as needed - Changes during respiratory infections or worsening symptoms: Increase Flovent 1182m to 4 puffs twice daily for TWO WEEKS. - Asthma control goals:  * Full participation in all desired activities (may need albuterol before activity) * Albuterol use two time or less a week on average (not counting use with activity) * Cough interfering with sleep two time or less a month * Oral steroids no more than once a year * No hospitalizations  3. Return in about 6 months (around 12/31/2021).    Please inform usKoreaf any Emergency Department visits, hospitalizations, or changes in symptoms. Call usKoreaefore going to the ED for breathing or allergy symptoms since we might be able to fit you in for a sick visit. Feel free to contact usKoreanytime with any questions, problems, or concerns.  It was a pleasure to see you again today!  Websites that have reliable patient information: 1. American Academy of Asthma, Allergy, and Immunology: www.aaaai.org 2. Food Allergy Research and Education (FARE): foodallergy.org 3. Mothers of Asthmatics: http://www.asthmacommunitynetwork.org 4. American College of Allergy, Asthma, and Immunology:  www.acaai.org   COVID-19 Vaccine Information can be found at: [email or web address] questions related to vaccine distribution or appointments, please email [email or web address] or call [phone]  We realize that you might be concerned about having an allergic reaction to the COVID19 vaccines. To help with that concern, WE ARE OFFERING THE COVID19 VACCINES IN OUR OFFICE! Ask the front desk for dates!     "Like" usKorean Facebook and Instagram for our latest updates!      A healthy democracy works best when ALNew York Life Insurancearticipate! Make sure you are registered to vote! If you have moved or changed any of your contact information, you will need to get this updated before voting!  In some cases, you MAY be able to register to vote online: [email or web address]

## 2021-07-03 NOTE — Progress Notes (Signed)
FOLLOW UP  Date of Service/Encounter:  07/03/21   Assessment:   Post COVID syndrome   Chronic idiopathic urticaria - improved by not fully resolved on Xolair   Moderate persistent asthma, uncomplicated - present before COVID19 infection (patient will be getting [place] prior IgE levels before starting Xolair to see if she would qualify for it from an asthma perspective)   Plan/Recommendations:   1. Post COVID syndrome with urticarial rash - Continue with Xolair but we will work on getting this approved every two weeks.  - Prednisone pack provided to have on hand if you get in trouble in [place].  - Continue with cetirizine '10mg'$  at night (you can add on a morning dose if you want to see if that helps).   2. Moderate persistent asthma, uncomplicated - Lung testing looked slightly worse than that obtained in October 2021. - We will add on Spiriva to see how you feel with that (samples provided - 2 puffs once daily).  - Daily controller medication(s): Flovent 146mg 2 puffs twice daily with spacer and Accolade twice daily - Prior to physical activity: albuterol 2 puffs 10-15 minutes before physical activity. - Rescue medications: albuterol 4 puffs every 4-6 hours as needed - Changes during respiratory infections or worsening symptoms: Increase Flovent 1130m to 4 puffs twice daily for TWO WEEKS. - Asthma control goals:  * Full participation in all desired activities (may need albuterol before activity) * Albuterol use two time or less a week on average (not counting use with activity) * Cough interfering with sleep two time or less a month * Oral steroids no more than once a year * No hospitalizations  3. Return in about 6 months (around 12/31/2021).    Subjective:   Rachel Fry a [age].o. female presenting today for follow up of  Chief Complaint  Patient presents with   Asthma    ACT 25 no flares    Urticaria    No flares     Rachel Fry has a history of the  following: Patient Active Problem List   Diagnosis Date Noted   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 hauler 12/25/2020   Chronic urticaria 12/25/2020   Moderate persistent asthma, uncomplicated 030000000 Ageusia 08/12/2020   Anosmia 08/12/2020   Myalgia, epidemic 08/12/2020   Cough with exposure to COVID-19 virus 08/12/2020   Olfactory impairment 05/29/2020   Acute UTI 04/23/2020   Left ureteral stone 04/23/2020   Intermittent chest pain 02/09/2020   History of COVID-19 02/09/2020   Shortness of breath 02/09/2020   Loss of taste 02/09/2020   Loss of smell 02/09/2020   Rash 02/09/2020   Arthralgia 02/09/2020   Postoperative history of checked last year 04/22/2017   Weakness of left arm 04/22/2017   Numbness and tingling in left arm 04/22/2017   Lower back pain 04/02/2016   Acute stress disorder 02/25/2015   Anxiety 02/25/2015   Airway hyperreactivity 02/25/2015   Bradycardia 02/25/2015   Hypersomnia 02/25/2015   Clinical depression 02/25/2015   Elevated blood sugar 02/25/2015   Fibrositis 02/25/2015   Acid reflux 02/25/2015   Hepatitis non A non B 02/25/2015   H/O disease 02/25/2015   Hypercholesteremia 02/25/2015   Headache, migraine 02/25/2015   Muscle ache 02/25/2015   Allergic rhinitis, seasonal 02/25/2015   Avitaminosis D 02/25/2015    History obtained from: chart review and patient.  Rachel Fry a [age].o. female presenting for a follow up visit.  She was last seen in March 2022.  At that time, her lung testing was not done.  We continue with Flovent 2 puffs twice daily with a spacer as well as albuterol as needed.  Her urticaria were improved but not fully resolved with the use of monthly Xolair.  We worked on getting it approved for every 2 weeks.  For her post COVID syndrome, we continued with cetirizine 10 mg at night.  Since last visit, she has done fairly well. They are flying to [place] next week. They are having a hard time with the airlines because they keep changing her  flights around.  They have 2 daughters who live in [place] and are planning to spend several weeks there.  Asthma/Respiratory Symptom History: Her asthma has been well controlled. She has been having some SOB which has been worse post COVID. She has been on Advair as well as Breztri and other combinations. 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 This is limited the controller that we have used on her.  She does feel like the Xolair has helped somewhat with her asthma control.  Skin Symptom History: She has been having breakthrough urticaria/itchiness when it gets closer to her next dose.  She has redness on the back of her palate as if she has eaten something with a puffy feeling. It is different than it was.  This starts to happen around 1 week or so before her next dose.  She is doing Zyrtec once a day.  She does a twice a day, she tends to be very tired.  She has not enrolled into any long COVID study. She is very interested in doing that. She does occasionally still get brain fog where she has a hard time functioning. She is up to date on all of her COVID vaccines, but she contracted COVID before the vaccine was out.   Otherwise, there have been no changes to her past medical history, surgical history, family history, or social history.    Review of Systems  Constitutional:  Positive for malaise/fatigue. Negative for fever and weight loss.  HENT: Negative.  Negative for congestion, ear discharge, ear pain and sore throat.   Eyes:  Negative for pain, discharge and redness.  Respiratory:  Positive for shortness of breath. Negative for cough, sputum production and wheezing.   Cardiovascular: Negative.  Negative for chest pain and palpitations.  Gastrointestinal:  Negative for abdominal pain, heartburn, nausea and vomiting.  Skin:  Positive for itching. Negative for rash.  Neurological:  Negative for dizziness and headaches.  Endo/Heme/Allergies:  Negative for environmental allergies. Does not  bruise/bleed easily.      Objective:   Blood pressure 138/90, pulse 82, temperature 97.6 F (36.4 C), resp. rate 16, height '5\' 5"'$  (1.651 m), weight 168 lb 3.2 oz (76.3 kg), SpO2 95 %. Body mass index is 27.99 kg/m.   Physical Exam:  Physical Exam Vitals reviewed.  Constitutional:      Appearance: She is well-developed.     Comments: Talkative.  HENT:     Head: Normocephalic and atraumatic.     Right Ear: Tympanic membrane, ear canal and external ear normal.     Left Ear: Tympanic membrane, ear canal and external ear normal.     Nose: Rhinorrhea present. No nasal deformity, septal deviation or mucosal edema.     Right Turbinates: Enlarged. Not swollen.     Left Turbinates: Enlarged. Not swollen.     Right Sinus: No maxillary sinus tenderness or frontal sinus tenderness.     Left Sinus: No  maxillary sinus tenderness or frontal sinus tenderness.     Mouth/Throat:     Mouth: Mucous membranes are not pale and not dry.     Pharynx: Uvula midline.  Eyes:     General: Lids are normal. No allergic shiner.       Right eye: No discharge.        Left eye: No discharge.     Conjunctiva/sclera: Conjunctivae normal.     Right eye: Right conjunctiva is not injected. No chemosis.    Left eye: Left conjunctiva is not injected. No chemosis.    Pupils: Pupils are equal, round, and reactive to light.  Cardiovascular:     Rate and Rhythm: Normal rate and regular rhythm.     Heart sounds: Normal heart sounds.  Pulmonary:     Effort: Pulmonary effort is normal. No tachypnea, accessory muscle usage or respiratory distress.     Breath sounds: Normal breath sounds. No wheezing, rhonchi or rales.     Comments: Moving air well in all lung fields. Chest:     Chest wall: No tenderness.  Lymphadenopathy:     Cervical: No cervical adenopathy.  Skin:    General: Skin is warm.     Capillary Refill: Capillary refill takes less than 2 seconds.     Coloration: Skin is not pale.     Findings: No  abrasion, erythema, petechiae or rash. Rash is not papular, urticarial or vesicular.     Comments: No urticaria, but there are excoriations.  Some of her skin is somewhat mottled.  Neurological:     Mental Status: She is alert.  Psychiatric:        Behavior: Behavior is cooperative.     Diagnostic studies:    Spirometry: results abnormal (FEV1: 1.35/52%, FVC: 2.13/63%, FEV1/FVC: 63%).    Spirometry consistent with mixed obstructive and restrictive disease.      Allergy Studies: none        Salvatore Marvel, MD  [facility]

## 2021-07-04 ENCOUNTER — Other Ambulatory Visit (HOSPITAL_COMMUNITY): Payer: Self-pay

## 2021-07-04 MED ORDER — EPINEPHRINE 0.3 MG/0.3ML IJ SOAJ
0.3000 mg | INTRAMUSCULAR | 1 refills | Status: DC | PRN
  Filled 2021-07-04: qty 4, 30d supply, fill #0
  Filled 2021-12-22: qty 4, 30d supply, fill #1

## 2021-07-04 MED ORDER — ALBUTEROL SULFATE HFA 108 (90 BASE) MCG/ACT IN AERS
2.0000 | INHALATION_SPRAY | RESPIRATORY_TRACT | 1 refills | Status: DC | PRN
  Filled 2021-07-04: qty 18, 16d supply, fill #0

## 2021-07-04 MED ORDER — FLUTICASONE PROPIONATE HFA 110 MCG/ACT IN AERO
2.0000 | INHALATION_SPRAY | Freq: Two times a day (BID) | RESPIRATORY_TRACT | 5 refills | Status: DC
  Filled 2021-07-04: qty 12, 30d supply, fill #0
  Filled 2021-09-11: qty 36, 90d supply, fill #0
  Filled 2021-12-13: qty 36, 90d supply, fill #1

## 2021-07-05 NOTE — Progress Notes (Signed)
L2-L3: Facet spurring and mild anterolisthesis. Circumferential disc bulging. Moderate crowding of the thecal sac. Patent foramina L3-L4: Disc bulging.  Negative facets.  No neural impingement L4-L5: Disc bulging and left foraminal annular fissure. Borderline facet spurring. No neural impingement  IMPRESSION: 1. Generally mild lumbar spine degeneration except at L2-3 where there is facet mediated anterolisthesis and moderate crowding of the thecal sac. 2. Diffusely patent foramina. 3. Negative for compression fracture.   Electronically Signed   By: Gilford Silvius.D.

## 2021-07-06 ENCOUNTER — Encounter: Payer: Self-pay | Admitting: Allergy & Immunology

## 2021-07-08 ENCOUNTER — Telehealth: Payer: Self-pay | Admitting: *Deleted

## 2021-07-08 ENCOUNTER — Other Ambulatory Visit: Payer: Self-pay

## 2021-07-08 ENCOUNTER — Ambulatory Visit: Payer: 59

## 2021-07-08 DIAGNOSIS — R519 Headache, unspecified: Secondary | ICD-10-CM | POA: Diagnosis not present

## 2021-07-08 DIAGNOSIS — M542 Cervicalgia: Secondary | ICD-10-CM

## 2021-07-08 DIAGNOSIS — R293 Abnormal posture: Secondary | ICD-10-CM

## 2021-07-08 NOTE — Telephone Encounter (Signed)
-----   Message from Larey Seat, MD sent at 07/05/2021  5:03 PM EDT ----- L2-L3: Facet spurring and mild anterolisthesis. Circumferential disc bulging. Moderate crowding of the thecal sac. Patent foramina L3-L4: Disc bulging.  Negative facets.  No neural impingement L4-L5: Disc bulging and left foraminal annular fissure. Borderline facet spurring. No neural impingement  IMPRESSION: 1. Generally mild lumbar spine degeneration except at L2-3 where there is facet mediated anterolisthesis and moderate crowding of the thecal sac. 2. Diffusely patent foramina. 3. Negative for compression fracture.   Electronically Signed   By: Gilford Silvius.D.

## 2021-07-08 NOTE — Therapy (Signed)
[place] MAIN [facility] SERVICES [address] [place], [place], [address] Phone: [phone]   Fax:  [phone]  Physical Therapy Treatment  Patient Details  Name: Rachel Fry MRN: [id number] Date of Birth: April 05, 1956 Referring Provider (PT): Dr. Karlton Lemon   Encounter Date: 07/08/2021   PT End of Session - 07/08/21 0903     Visit Number 7    Number of Visits 12    Date for PT Re-Evaluation 09/18/21    Authorization Type 7/10 eval 6/7    PT Start Time 0710    PT Stop Time 0755    PT Time Calculation (min) 45 min    Activity Tolerance Patient tolerated treatment well    Behavior During Therapy WFL for tasks assessed/performed             Past Medical History:  Diagnosis Date   Allergy    Asthma    COVID-19    History of kidney stones    Hypothyroidism    Migraines    Numbness and tingling    left side of body, occasionally right side    S/P Botox injection    Urticaria     Past Surgical History:  Procedure Laterality Date   ADENOIDECTOMY     ARM NEUROPLASTY Right 1994 and 1996   for chronic pain   CESAREAN SECTION     CYSTOSCOPY W/ RETROGRADES Left 04/23/2020   Procedure: CYSTOSCOPY WITH RETROGRADE PYELOGRAM;  Surgeon: Abbie Sons, MD;  Location: [facility];  Service: Urology;  Laterality: Left;   CYSTOSCOPY W/ RETROGRADES Left 05/13/2020   Procedure: CYSTOSCOPY WITH RETROGRADE PYELOGRAM;  Surgeon: Hollice Espy, MD;  Location: [facility];  Service: Urology;  Laterality: Left;   CYSTOSCOPY WITH STENT PLACEMENT Left 04/23/2020   Procedure: CYSTOSCOPY WITH STENT PLACEMENT;  Surgeon: Abbie Sons, MD;  Location: [facility];  Service: Urology;  Laterality: Left;   CYSTOSCOPY/URETEROSCOPY/HOLMIUM LASER/STENT PLACEMENT Left 05/13/2020   Procedure: CYSTOSCOPY/URETEROSCOPY/LASER/STENT EXCHANGE;  Surgeon: Hollice Espy, MD;  Location: [facility];  Service: Urology;  Laterality: Left;   OVARIAN CYST REMOVAL  1973   STONE  EXTRACTION WITH BASKET  05/13/2020   Procedure: STONE EXTRACTION WITH BASKET;  Surgeon: Hollice Espy, MD;  Location: [facility];  Service: Urology;;   TONSILLECTOMY     TONSILLECTOMY AND ADENOIDECTOMY     TUBAL LIGATION  1988    There were no vitals filed for this visit.   Subjective Assessment - 07/08/21 3710     Subjective Patient will be leaving thursday for her trip to visit her children in [place] and will return mid october.    Pertinent History Patient is a 65 year old female who presents to physical therapy for migraines with specific referral for dry needling.  Patient has been having migraines for about 14-15 years. They have changed from front temporal all day long 2-3/10, a couple years ago started having tingling in left side of body, numbness in arm and leg. Additionally sometimes get's belly aches.  Changed medication in 2020. Was getting trigger injections but it was too expensive. PMH of HLD, aortic atherosclerosis, asthma, COVID 19, SOB, hypothyroidism, atypical facial pain (2019), neuropathy (2020), fibromyalgia    Limitations Reading;House hold activities;Other (comment)    How long can you sit comfortably? n/a    How long can you stand comfortably? n/a    How long can you walk comfortably? n/a    Diagnostic tests negative MRI for cervical, thoracic, brain    Patient  Stated Goals to help manage symptoms    Currently in Pain? Yes    Pain Score 2     Pain Location Head    Pain Orientation Right;Left    Pain Descriptors / Indicators Aching    Pain Type Neuropathic pain    Pain Onset More than a month ago    Pain Frequency Intermittent                   Manual: Cervical side bend with overpressure to glenohumeral joint 5x 30 seconds Cervical rotation with overpressure to glenohumeral joint: 5x 30 seconds Suboccipital release 4x30 seconds J mobilization for postural kyphosis reduction 4x 30 seconds Thoracic mobilizations grade II for reduced hypomobility  x 5 minutes cervical mobilizations grade II for reduced hypomobility x 5 minutes   Trigger Point Dry Needling (TDN), unbilled Education performed with patient regarding potential benefit of TDN. Reviewed precautions and risks with patient. Reviewed special precautions/risks over lung fields which include pneumothorax. Reviewed signs and symptoms of pneumothorax and advised pt to go to ER immediately if these symptoms develop advise them of dry needling treatment. Extensive time spent with pt to ensure full understanding of TDN risks. Pt provided verbal consent to treatment. TDN performed to  with 0.30 x 30 and .2 x 0.3 single needle placements with local twitch response (LTR). Pistoning technique utilized. Improved pain-free motion following intervention. L and Rcervical paraspinals, Upper trap (L and R), temporalis,  x 14 minutes   Patient educated on postural interventions for plane trip including utilization of towel behind back and scapular retractions.    Pt educated throughout session about proper posture and technique with exercises. Improved exercise technique, movement at target joints, use of target muscles after min to mod verbal, visual, tactile cues.  Patient is to be leaving for ~1 month for trip to [place] to see family. Patient educated on postural interventions for plane trip including utilization of towel behind back and scapular retractions. Multiple trigger points in bilateral upper traps noted with additional hypomobility of thoracic kyphosis that improved by end of session. She will benefit from skilled physical therapy to reduce pain, improve patient independence with symptom management, and improve quality of life                PT Education - 07/08/21 0903     Education provided Yes    Education Details exercise technique, body mechanics    Person(s) Educated Patient    Methods Explanation;Demonstration;Tactile cues;Verbal cues    Comprehension Verbalized  understanding;Returned demonstration;Verbal cues required;Tactile cues required              PT Short Term Goals - 06/26/21 0806       PT SHORT TERM GOAL #1   Title Pt will be independent with HEP in order to decrease pain in order to improve pain-free function at home and work.    Baseline 6/7: HEP given 9/1: HEp compliant    Time 6    Period Weeks    Status Partially Met    Target Date 08/07/21               PT Long Term Goals - 06/26/21 0803       PT LONG TERM GOAL #1   Title Patient will increase FOTO score to equal to or greater than 59%    to demonstrate statistically significant improvement in mobility and quality of life.    Baseline 6/7: 50% 9/1: 59%    Time  12    Period Weeks    Status Partially Met    Target Date 09/18/21      PT LONG TERM GOAL #2   Title Pt will decrease worst pain as reported on NPRS by at least 3 points (3/10) in order to demonstrate clinically significant reduction in pain.    Baseline 6/7: 6/10 9/1: 4/10    Time 12    Period Weeks    Status Partially Met    Target Date 09/18/21      PT LONG TERM GOAL #3   Title Pt will demonstrate pain-free full range cervical motion in order to perform IADLs such as driving and household chores without increase in symptoms.    Baseline 6/7: see note for limitation 9/1:flexion 54, extension 60, SB R 35 L 30, rotation R 51 L 62    Time 12    Period Weeks    Status Partially Met    Target Date 09/18/21      PT LONG TERM GOAL #4   Title Patient will have less than two days a week of high pain migraines and demonstrate ability to self control/contain migraines.    Baseline 9/1:every day getting the mild ones, 2x/week for high pain migraines.    Time 12    Period Weeks    Status Partially Met    Target Date 09/18/21                   Plan - 07/08/21 0906     Clinical Impression Statement Patient is to be leaving for ~1 month for trip to [place] to see family. Patient educated on  postural interventions for plane trip including utilization of towel behind back and scapular retractions. Multiple trigger points in bilateral upper traps noted with additional hypomobility of thoracic kyphosis that improved by end of session. She will benefit from skilled physical therapy to reduce pain, improve patient independence with symptom management, and improve quality of life    Personal Factors and Comorbidities Age;Behavior Pattern;Comorbidity 3+;Fitness;Past/Current Experience;Profession;Time since onset of injury/illness/exacerbation    Comorbidities HLD, aortic atherosclerosis, asthma, COVID 19, SOB, hypothyroidism, atypical facial pain (2019), neuropathy (2020), fibromyalgia    Examination-Activity Limitations Caring for Others;Carry;Lift;Sleep;Transfers    Examination-Participation Restrictions Cleaning;Community Activity;Interpersonal Relationship;Driving;Laundry;Shop;Occupation;Meal Prep;Yard Work    [employer]chant [employer] officer Evolving/Moderate complexity    Rehab Potential Fair    PT Frequency Biweekly    PT Duration 12 weeks    PT Treatment/Interventions ADLs/Self Care Home Management;Dry needling;Manual techniques;Therapeutic exercise;Therapeutic activities;Neuromuscular re-education;Aquatic Therapy;Biofeedback;Canalith Repostioning;Cryotherapy;Electrical Stimulation;Iontophoresis 39m/ml Dexamethasone;Moist Heat;Ultrasound;Patient/family education;Passive range of motion;Energy conservation;Vestibular;Joint Manipulations;Traction;Gait training;Balance training;Taping;Spinal Manipulations;Visual/perceptual remediation/compensation    PT Next Visit Plan Perform trigger point dry needling and manual techniques until pt can tolerate active strengthening, additional chronic pain education    PT Home Exercise Plan see above    Consulted and Agree with Plan of Care Patient             Patient will benefit from skilled therapeutic intervention in order to improve the  following deficits and impairments:  Impaired perceived functional ability, Impaired UE functional use, Pain, Decreased mobility, Decreased range of motion, Decreased strength, Hypomobility, Impaired flexibility, Increased muscle spasms, Postural dysfunction, Improper body mechanics  Visit Diagnosis: Abnormal posture  Nonintractable headache, unspecified chronicity pattern, unspecified headache type  Cervicalgia     Problem List Patient Active Problem List   Diagnosis Date Noted   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 hauler 12/25/2020   Chronic urticaria 12/25/2020   Moderate persistent asthma, uncomplicated 078/29/5621  Ageusia 08/12/2020   Anosmia 08/12/2020   Myalgia, epidemic 08/12/2020   Cough with exposure to COVID-19 virus 08/12/2020   Olfactory impairment 05/29/2020   Acute UTI 04/23/2020   Left ureteral stone 04/23/2020   Intermittent chest pain 02/09/2020   History of COVID-19 02/09/2020   Shortness of breath 02/09/2020   Loss of taste 02/09/2020   Loss of smell 02/09/2020   Rash 02/09/2020   Arthralgia 02/09/2020   Postoperative history of checked last year 04/22/2017   Weakness of left arm 04/22/2017   Numbness and tingling in left arm 04/22/2017   Lower back pain 04/02/2016   Acute stress disorder 02/25/2015   Anxiety 02/25/2015   Airway hyperreactivity 02/25/2015   Bradycardia 02/25/2015   Hypersomnia 02/25/2015   Clinical depression 02/25/2015   Elevated blood sugar 02/25/2015   Fibrositis 02/25/2015   Acid reflux 02/25/2015   Hepatitis non A non B 02/25/2015   H/O disease 02/25/2015   Hypercholesteremia 02/25/2015   Headache, migraine 02/25/2015   Muscle ache 02/25/2015   Allergic rhinitis, seasonal 02/25/2015   Avitaminosis D 02/25/2015   Janna Arch, PT, DPT  07/08/2021, 9:07 AM  [place] [facility] MAIN [facility] SERVICES [address] [place], [place], [address] Phone: [phone]   Fax:  [phone]  Name: Rachel Fry MRN:  [id number] Date of Birth: Sep 12, 1956

## 2021-07-10 ENCOUNTER — Ambulatory Visit: Payer: 59

## 2021-07-16 ENCOUNTER — Other Ambulatory Visit (HOSPITAL_COMMUNITY): Payer: Self-pay

## 2021-07-17 ENCOUNTER — Other Ambulatory Visit (HOSPITAL_COMMUNITY): Payer: Self-pay

## 2021-07-21 ENCOUNTER — Other Ambulatory Visit (HOSPITAL_COMMUNITY): Payer: Self-pay

## 2021-07-24 ENCOUNTER — Ambulatory Visit: Payer: 59

## 2021-07-25 ENCOUNTER — Other Ambulatory Visit (HOSPITAL_COMMUNITY): Payer: Self-pay

## 2021-07-29 ENCOUNTER — Other Ambulatory Visit (HOSPITAL_COMMUNITY): Payer: Self-pay

## 2021-07-30 ENCOUNTER — Other Ambulatory Visit (HOSPITAL_COMMUNITY): Payer: Self-pay

## 2021-08-07 ENCOUNTER — Ambulatory Visit: Payer: 59

## 2021-08-07 ENCOUNTER — Other Ambulatory Visit (HOSPITAL_COMMUNITY): Payer: Self-pay

## 2021-08-08 ENCOUNTER — Other Ambulatory Visit (HOSPITAL_COMMUNITY): Payer: Self-pay

## 2021-08-12 ENCOUNTER — Ambulatory Visit: Payer: 59 | Attending: Internal Medicine

## 2021-08-12 ENCOUNTER — Other Ambulatory Visit: Payer: Self-pay

## 2021-08-12 DIAGNOSIS — R519 Headache, unspecified: Secondary | ICD-10-CM | POA: Insufficient documentation

## 2021-08-12 DIAGNOSIS — M542 Cervicalgia: Secondary | ICD-10-CM | POA: Diagnosis not present

## 2021-08-12 DIAGNOSIS — G43719 Chronic migraine without aura, intractable, without status migrainosus: Secondary | ICD-10-CM | POA: Diagnosis not present

## 2021-08-12 DIAGNOSIS — R293 Abnormal posture: Secondary | ICD-10-CM | POA: Insufficient documentation

## 2021-08-12 NOTE — Therapy (Signed)
[place] MAIN [facility] SERVICES [address] [place], [place], [address] Phone: [phone]   Fax:  [phone]  Physical Therapy Treatment  Patient Details  Name: Rachel Fry MRN: [id number] Date of Birth: 1956/10/08 Referring Provider (PT): Dr. Karlton Lemon   Encounter Date: 08/12/2021   PT End of Session - 08/12/21 0837     Visit Number 8    Number of Visits 12    Date for PT Re-Evaluation 09/18/21    Authorization Type 8/10 eval 6/7    PT Start Time 0710    PT Stop Time 0754    PT Time Calculation (min) 44 min    Activity Tolerance Patient tolerated treatment well    Behavior During Therapy WFL for tasks assessed/performed             Past Medical History:  Diagnosis Date   Allergy    Asthma    COVID-19    History of kidney stones    Hypothyroidism    Migraines    Numbness and tingling    left side of body, occasionally right side    S/P Botox injection    Urticaria     Past Surgical History:  Procedure Laterality Date   ADENOIDECTOMY     ARM NEUROPLASTY Right 1994 and 1996   for chronic pain   CESAREAN SECTION     CYSTOSCOPY W/ RETROGRADES Left 04/23/2020   Procedure: CYSTOSCOPY WITH RETROGRADE PYELOGRAM;  Surgeon: Abbie Sons, MD;  Location: [facility];  Service: Urology;  Laterality: Left;   CYSTOSCOPY W/ RETROGRADES Left 05/13/2020   Procedure: CYSTOSCOPY WITH RETROGRADE PYELOGRAM;  Surgeon: Hollice Espy, MD;  Location: [facility];  Service: Urology;  Laterality: Left;   CYSTOSCOPY WITH STENT PLACEMENT Left 04/23/2020   Procedure: CYSTOSCOPY WITH STENT PLACEMENT;  Surgeon: Abbie Sons, MD;  Location: [facility];  Service: Urology;  Laterality: Left;   CYSTOSCOPY/URETEROSCOPY/HOLMIUM LASER/STENT PLACEMENT Left 05/13/2020   Procedure: CYSTOSCOPY/URETEROSCOPY/LASER/STENT EXCHANGE;  Surgeon: Hollice Espy, MD;  Location: [facility];  Service: Urology;  Laterality: Left;   OVARIAN CYST REMOVAL  1973   STONE  EXTRACTION WITH BASKET  05/13/2020   Procedure: STONE EXTRACTION WITH BASKET;  Surgeon: Hollice Espy, MD;  Location: [facility];  Service: Urology;;   TONSILLECTOMY     TONSILLECTOMY AND ADENOIDECTOMY     TUBAL LIGATION  1988    There were no vitals filed for this visit.   Subjective Assessment - 08/12/21 0813     Subjective Patient returning from month trip to [place] to see family. Is getting her botox injection this afternoon.    Pertinent History Patient is a 65 year old female who presents to physical therapy for migraines with specific referral for dry needling.  Patient has been having migraines for about 14-15 years. They have changed from front temporal all day long 2-3/10, a couple years ago started having tingling in left side of body, numbness in arm and leg. Additionally sometimes get's belly aches.  Changed medication in 2020. Was getting trigger injections but it was too expensive. PMH of HLD, aortic atherosclerosis, asthma, COVID 19, SOB, hypothyroidism, atypical facial pain (2019), neuropathy (2020), fibromyalgia    Limitations Reading;House hold activities;Other (comment)    How long can you sit comfortably? n/a    How long can you stand comfortably? n/a    How long can you walk comfortably? n/a    Diagnostic tests negative MRI for cervical, thoracic, brain    Patient Stated Goals  to help manage symptoms    Currently in Pain? Yes    Pain Score 2     Pain Location Head    Pain Orientation Posterior    Pain Descriptors / Indicators Aching    Pain Type Neuropathic pain    Pain Onset More than a month ago    Pain Frequency Constant                     Treatment Cervical side bend with overpressure to glenohumeral joint 5x 30 seconds Cervical rotation with overpressure to glenohumeral joint: 5x 30 seconds Suboccipital release 4x30 seconds J mobilization for postural kyphosis reduction 4x 30 seconds cervical mobilizations grade II for reduced hypomobility x  2 minutes     RTB ER 15x with cues for elbows against body RTB overhead Y flexion 15x with patient reporting as challenging Towel under cervical spine: cervical flexion/extension 10x 3 second holds    Trigger Point Dry Needling (TDN), unbilled Education performed with patient regarding potential benefit of TDN. Reviewed precautions and risks with patient. Reviewed special precautions/risks over lung fields which include pneumothorax. Reviewed signs and symptoms of pneumothorax and advised pt to go to ER immediately if these symptoms develop advise them of dry needling treatment. Extensive time spent with pt to ensure full understanding of TDN risks. Pt provided verbal consent to treatment. TDN performed to  with 0.30 x 30 and .2 x 0.3 single needle placements with local twitch response (LTR). Pistoning technique utilized. Improved pain-free motion following intervention. L and Rcervical paraspinals, Upper trap (L and R), temporalis, suboccipital musculature  x 18 minutes     Patient educated on postural interventions for plane trip including utilization of towel behind back and scapular retractions.      Pt educated throughout session about proper posture and technique with exercises. Improved exercise technique, movement at target joints, use of target muscles after min to mod verbal, visual, tactile cues.   Patient returning to PT after trip to [place] to see family. Patient has bilateral upper trap trigger points as well as cervical paraspinal points. Postural correction interventions tolerated well with no pain increase. She will benefit from skilled physical therapy to reduce pain, improve patient independence with symptom management, and improve quality of life                       PT Education - 08/12/21 0836     Education provided Yes    Education Details exercise technique, body mechanics    Person(s) Educated Patient    Methods  Explanation;Demonstration;Tactile cues;Verbal cues    Comprehension Verbalized understanding;Returned demonstration;Verbal cues required;Tactile cues required              PT Short Term Goals - 06/26/21 0806       PT SHORT TERM GOAL #1   Title Pt will be independent with HEP in order to decrease pain in order to improve pain-free function at home and work.    Baseline 6/7: HEP given 9/1: HEp compliant    Time 6    Period Weeks    Status Partially Met    Target Date 08/07/21               PT Long Term Goals - 06/26/21 0803       PT LONG TERM GOAL #1   Title Patient will increase FOTO score to equal to or greater than 59%    to demonstrate statistically significant  improvement in mobility and quality of life.    Baseline 6/7: 50% 9/1: 59%    Time 12    Period Weeks    Status Partially Met    Target Date 09/18/21      PT LONG TERM GOAL #2   Title Pt will decrease worst pain as reported on NPRS by at least 3 points (3/10) in order to demonstrate clinically significant reduction in pain.    Baseline 6/7: 6/10 9/1: 4/10    Time 12    Period Weeks    Status Partially Met    Target Date 09/18/21      PT LONG TERM GOAL #3   Title Pt will demonstrate pain-free full range cervical motion in order to perform IADLs such as driving and household chores without increase in symptoms.    Baseline 6/7: see note for limitation 9/1:flexion 54, extension 60, SB R 35 L 30, rotation R 51 L 62    Time 12    Period Weeks    Status Partially Met    Target Date 09/18/21      PT LONG TERM GOAL #4   Title Patient will have less than two days a week of high pain migraines and demonstrate ability to self control/contain migraines.    Baseline 9/1:every day getting the mild ones, 2x/week for high pain migraines.    Time 12    Period Weeks    Status Partially Met    Target Date 09/18/21                   Plan - 08/12/21 0841     Clinical Impression Statement Patient  returning to PT after trip to [place] to see family. Patient has bilateral upper trap trigger points as well as cervical paraspinal points. Postural correction interventions tolerated well with no pain increase. She will benefit from skilled physical therapy to reduce pain, improve patient independence with symptom management, and improve quality of life    Personal Factors and Comorbidities Age;Behavior Pattern;Comorbidity 3+;Fitness;Past/Current Experience;Profession;Time since onset of injury/illness/exacerbation    Comorbidities HLD, aortic atherosclerosis, asthma, COVID 19, SOB, hypothyroidism, atypical facial pain (2019), neuropathy (2020), fibromyalgia    Examination-Activity Limitations Caring for Others;Carry;Lift;Sleep;Transfers    Examination-Participation Restrictions Cleaning;Community Activity;Interpersonal Relationship;Driving;Laundry;Shop;Occupation;Meal Prep;Yard Work    [employer]chant [employer] officer Evolving/Moderate complexity    Rehab Potential Fair    PT Frequency Biweekly    PT Duration 12 weeks    PT Treatment/Interventions ADLs/Self Care Home Management;Dry needling;Manual techniques;Therapeutic exercise;Therapeutic activities;Neuromuscular re-education;Aquatic Therapy;Biofeedback;Canalith Repostioning;Cryotherapy;Electrical Stimulation;Iontophoresis 71m/ml Dexamethasone;Moist Heat;Ultrasound;Patient/family education;Passive range of motion;Energy conservation;Vestibular;Joint Manipulations;Traction;Gait training;Balance training;Taping;Spinal Manipulations;Visual/perceptual remediation/compensation    PT Next Visit Plan Perform trigger point dry needling and manual techniques until pt can tolerate active strengthening, additional chronic pain education    PT Home Exercise Plan see above    Consulted and Agree with Plan of Care Patient             Patient will benefit from skilled therapeutic intervention in order to improve the following deficits and impairments:   Impaired perceived functional ability, Impaired UE functional use, Pain, Decreased mobility, Decreased range of motion, Decreased strength, Hypomobility, Impaired flexibility, Increased muscle spasms, Postural dysfunction, Improper body mechanics  Visit Diagnosis: Abnormal posture  Nonintractable headache, unspecified chronicity pattern, unspecified headache type  Cervicalgia     Problem List Patient Active Problem List   Diagnosis Date Noted   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 hauler 12/25/2020   Chronic urticaria 12/25/2020   Moderate persistent asthma, uncomplicated 087/56/4332  Ageusia 08/12/2020   Anosmia 08/12/2020   Myalgia, epidemic 08/12/2020   Cough with exposure to COVID-19 virus 08/12/2020   Olfactory impairment 05/29/2020   Acute UTI 04/23/2020   Left ureteral stone 04/23/2020   Intermittent chest pain 02/09/2020   History of COVID-19 02/09/2020   Shortness of breath 02/09/2020   Loss of taste 02/09/2020   Loss of smell 02/09/2020   Rash 02/09/2020   Arthralgia 02/09/2020   Postoperative history of checked last year 04/22/2017   Weakness of left arm 04/22/2017   Numbness and tingling in left arm 04/22/2017   Lower back pain 04/02/2016   Acute stress disorder 02/25/2015   Anxiety 02/25/2015   Airway hyperreactivity 02/25/2015   Bradycardia 02/25/2015   Hypersomnia 02/25/2015   Clinical depression 02/25/2015   Elevated blood sugar 02/25/2015   Fibrositis 02/25/2015   Acid reflux 02/25/2015   Hepatitis non A non B 02/25/2015   H/O disease 02/25/2015   Hypercholesteremia 02/25/2015   Headache, migraine 02/25/2015   Muscle ache 02/25/2015   Allergic rhinitis, seasonal 02/25/2015   Avitaminosis D 02/25/2015    Janna Arch, PT, DPT  08/12/2021, 8:42 AM  [facility] MAIN [facility] SERVICES [address] [place], [place], [address] Phone: [phone]   Fax:  [phone]  Name: Rachel Fry MRN: [id number] Date of Birth:  08-12-1956

## 2021-08-14 ENCOUNTER — Encounter (INDEPENDENT_AMBULATORY_CARE_PROVIDER_SITE_OTHER): Payer: 59 | Admitting: Diagnostic Neuroimaging

## 2021-08-14 ENCOUNTER — Other Ambulatory Visit: Payer: Self-pay

## 2021-08-14 ENCOUNTER — Ambulatory Visit (INDEPENDENT_AMBULATORY_CARE_PROVIDER_SITE_OTHER): Payer: 59 | Admitting: *Deleted

## 2021-08-14 ENCOUNTER — Ambulatory Visit (INDEPENDENT_AMBULATORY_CARE_PROVIDER_SITE_OTHER): Payer: 59 | Admitting: Diagnostic Neuroimaging

## 2021-08-14 DIAGNOSIS — R208 Other disturbances of skin sensation: Secondary | ICD-10-CM | POA: Diagnosis not present

## 2021-08-14 DIAGNOSIS — R29898 Other symptoms and signs involving the musculoskeletal system: Secondary | ICD-10-CM

## 2021-08-14 DIAGNOSIS — Z0289 Encounter for other administrative examinations: Secondary | ICD-10-CM

## 2021-08-14 DIAGNOSIS — L501 Idiopathic urticaria: Secondary | ICD-10-CM

## 2021-08-19 ENCOUNTER — Other Ambulatory Visit: Payer: Self-pay

## 2021-08-19 MED ORDER — FLUCELVAX QUADRIVALENT 0.5 ML IM SUSY
PREFILLED_SYRINGE | INTRAMUSCULAR | 0 refills | Status: DC
  Filled 2021-08-19: qty 0.5, 1d supply, fill #0

## 2021-08-20 ENCOUNTER — Encounter: Payer: Self-pay | Admitting: Neurology

## 2021-08-20 NOTE — Progress Notes (Signed)
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

## 2021-08-20 NOTE — Procedures (Signed)
[facility]  NCS (NERVE CONDUCTION STUDY) WITH EMG (ELECTROMYOGRAPHY) REPORT   STUDY DATE: 08/14/21 PATIENT NAME: Rachel Fry DOB: September 21, 1956 MRN: [id number]  ORDERING CLINICIAN: Larey Seat, MD   TECHNOLOGIST: Sherre Scarlet ELECTROMYOGRAPHER: Earlean Polka. Ellamay Fors, MD  CLINICAL INFORMATION: 64 year old female with left-sided numbness and tingling.  FINDINGS: NERVE CONDUCTION STUDY:  Left median, left ulnar, left peroneal and left tibial motor responses are normal.  Left sural, left superficial peroneal, left median and left ulnar sensory responses are normal.  Left median to ulnar transcarpal comparison study is normal.  Left ulnar and left tibial F wave latencies are normal.   NEEDLE ELECTROMYOGRAPHY:  Needle examination of left lower extremity is normal.   IMPRESSION:   Normal study.  No electrodiagnostic evidence of large fiber neuropathy at this time.    INTERPRETING PHYSICIAN:  Penni Bombard, MD Certified in Neurology, Neurophysiology and Neuroimaging  [facility] [address], [place], [place] [address] [phone]   [facility]    Nerve / Sites Muscle Latency Ref. Amplitude Ref. Rel Amp Segments Distance Velocity Ref. Area    ms ms mV mV %  cm m/s m/s mVms  L Median - APB     Wrist APB 3.7 ?4.4 4.9 ?4.0 100 Wrist - APB 7   20.2     Upper arm APB 8.0  4.9  100 Upper arm - Wrist 22 51 ?49 19.5  L Ulnar - ADM     Wrist ADM 2.9 ?3.3 12.8 ?6.0 100 Wrist - ADM 7   42.9     B.Elbow ADM 6.3  12.1  94.2 B.Elbow - Wrist 19 55 ?49 40.7     A.Elbow ADM 8.1  12.1  101 A.Elbow - B.Elbow 10 55 ?49 41.5  L Peroneal - EDB     Ankle EDB 4.0 ?6.5 4.2 ?2.0 100 Ankle - EDB 9   16.3     Fib head EDB 10.0  3.5  83.7 Fib head - Ankle 27 45 ?44 15.1     Pop fossa EDB 12.3  3.5  100 Pop fossa - Fib head 10 45 ?44 15.1         Pop fossa - Ankle      L Tibial - AH     Ankle AH 3.4 ?5.8 15.0 ?4.0 100 Ankle - AH 9   35.7      Pop fossa AH 12.9  10.0  66.4 Pop fossa - Ankle 39 41 ?41 28.9             SNC    Nerve / Sites Rec. Site Peak Lat Ref.  Amp Ref. Segments Distance Peak Diff Ref.    ms ms V V  cm ms ms  L Sural - Ankle (Calf)     Calf Ankle 3.0 ?4.4 15 ?6 Calf - Ankle 14    L Superficial peroneal - Ankle     Lat leg Ankle 4.0 ?4.4 8 ?6 Lat leg - Ankle 14    L Median, Ulnar - Transcarpal comparison     Median Palm Wrist 2.2 ?2.2 60 ?35 Median Palm - Wrist 8       Ulnar Palm Wrist 2.1 ?2.2 48 ?12 Ulnar Palm - Wrist 8          Median Palm - Ulnar Palm  0.0 ?0.4  L Median - Orthodromic (Dig II, Mid palm)     Dig II Wrist 3.1 ?3.4 14 ?10 Dig II -  Wrist 13    L Ulnar - Orthodromic, (Dig V, Mid palm)     Dig V Wrist 2.8 ?3.1 11 ?5 Dig V - Wrist 22                 F  Wave    Nerve F Lat Ref.   ms ms  L Tibial - AH 53.4 ?56.0  L Ulnar - ADM 28.4 ?32.0         EMG Summary Table    Spontaneous MUAP Recruitment  Muscle IA Fib PSW Fasc Other Amp Dur. Poly Pattern  L. Vastus medialis Normal None None None _______ Normal Normal Normal Normal  L. Tibialis anterior Normal None None None _______ Normal Normal Normal Normal  L. Gastrocnemius (Medial head) Normal None None None _______ Normal Normal Normal Normal

## 2021-08-21 ENCOUNTER — Ambulatory Visit: Payer: 59

## 2021-08-26 ENCOUNTER — Other Ambulatory Visit: Payer: Self-pay

## 2021-08-26 ENCOUNTER — Ambulatory Visit: Payer: 59 | Attending: Internal Medicine

## 2021-08-26 DIAGNOSIS — M542 Cervicalgia: Secondary | ICD-10-CM | POA: Diagnosis not present

## 2021-08-26 DIAGNOSIS — R519 Headache, unspecified: Secondary | ICD-10-CM | POA: Insufficient documentation

## 2021-08-26 DIAGNOSIS — R293 Abnormal posture: Secondary | ICD-10-CM | POA: Diagnosis not present

## 2021-08-26 NOTE — Therapy (Signed)
[facility] [address] [place], [place], [address] Phone: [phone]   Fax:  [phone]  Physical Therapy Treatment  Patient Details  Name: Rachel Fry MRN: [id number] Date of Birth: 10/17/1956 Referring Provider (PT): Dr. Shelton   Encounter Date: 08/26/2021   PT End of Session - 08/26/21 0749     Visit Number 9    Number of Visits 12    Date for PT Re-Evaluation 09/18/21    Authorization Type 9/10 eval 6/7    PT Start Time 0716    PT Stop Time 0759    PT Time Calculation (min) 43 min    Activity Tolerance Patient tolerated treatment well    Behavior During Therapy WFL for tasks assessed/performed             Past Medical History:  Diagnosis Date   Allergy    Asthma    COVID-19    History of kidney stones    Hypothyroidism    Migraines    Numbness and tingling    left side of body, occasionally right side    S/P Botox injection    Urticaria     Past Surgical History:  Procedure Laterality Date   ADENOIDECTOMY     ARM NEUROPLASTY Right 1994 and 1996   for chronic pain   CESAREAN SECTION     CYSTOSCOPY W/ RETROGRADES Left 04/23/2020   Procedure: CYSTOSCOPY WITH RETROGRADE PYELOGRAM;  Surgeon: Stoioff, Scott C, MD;  Location: [facility];  Service: Urology;  Laterality: Left;   CYSTOSCOPY W/ RETROGRADES Left 05/13/2020   Procedure: CYSTOSCOPY WITH RETROGRADE PYELOGRAM;  Surgeon: Brandon, Ashley, MD;  Location: [facility];  Service: Urology;  Laterality: Left;   CYSTOSCOPY WITH STENT PLACEMENT Left 04/23/2020   Procedure: CYSTOSCOPY WITH STENT PLACEMENT;  Surgeon: Stoioff, Scott C, MD;  Location: [facility];  Service: Urology;  Laterality: Left;   CYSTOSCOPY/URETEROSCOPY/HOLMIUM LASER/STENT PLACEMENT Left 05/13/2020   Procedure: CYSTOSCOPY/URETEROSCOPY/LASER/STENT EXCHANGE;  Surgeon: Brandon, Ashley, MD;  Location: [facility];  Service: Urology;  Laterality: Left;   OVARIAN CYST REMOVAL  1973   STONE  EXTRACTION WITH BASKET  05/13/2020   Procedure: STONE EXTRACTION WITH BASKET;  Surgeon: Brandon, Ashley, MD;  Location: [facility];  Service: Urology;;   TONSILLECTOMY     TONSILLECTOMY AND ADENOIDECTOMY     TUBAL LIGATION  1988    There were no vitals filed for this visit.   Subjective Assessment - 08/26/21 0747     Subjective Patient reports compliance with HEP, is starting to have a migraine today.    Pertinent History Patient is a 64 year old female who presents to physical therapy for migraines with specific referral for dry needling.  Patient has been having migraines for about 14-15 years. They have changed from front temporal all day long 2-3/10, a couple years ago started having tingling in left side of body, numbness in arm and leg. Additionally sometimes get's belly aches.  Changed medication in 2020. Was getting trigger injections but it was too expensive. PMH of HLD, aortic atherosclerosis, asthma, COVID 19, SOB, hypothyroidism, atypical facial pain (2019), neuropathy (2020), fibromyalgia    Limitations Reading;House hold activities;Other (comment)    How long can you sit comfortably? n/a    How long can you stand comfortably? n/a    How long can you walk comfortably? n/a    Diagnostic tests negative MRI for cervical, thoracic, brain    Patient Stated Goals to help manage symptoms      Currently in Pain? Yes    Pain Score 3     Pain Location Head    Pain Orientation Posterior    Pain Descriptors / Indicators Aching    Pain Type Neuropathic pain    Pain Onset More than a month ago    Pain Frequency Constant                    Treatment Cervical side bend with overpressure to glenohumeral joint 5x 30 seconds Cervical rotation with overpressure to glenohumeral joint: 5x 30 seconds Suboccipital release 4x30 seconds J mobilization for postural kyphosis reduction 4x 30 seconds cervical mobilizations grade II for reduced hypomobility x 4 minutes       RTB ER 15x  with cues for elbows against body RTB overhead Y flexion 15x with patient reporting as challenging Towel under cervical spine: cervical flexion/extension 10x 5 second holds      Trigger Point Dry Needling (TDN), unbilled Education performed with patient regarding potential benefit of TDN. Reviewed precautions and risks with patient. Reviewed special precautions/risks over lung fields which include pneumothorax. Reviewed signs and symptoms of pneumothorax and advised pt to go to ER immediately if these symptoms develop advise them of dry needling treatment. Extensive time spent with pt to ensure full understanding of TDN risks. Pt provided verbal consent to treatment. TDN performed to  with 0.30 x 30 and .2 x 0.3 single needle placements with local twitch response (LTR). Pistoning technique utilized. Improved pain-free motion following intervention. L and Rcervical paraspinals, Upper trap (L and R), temporalis, suboccipital musculature  x 18 minutes     Patient educated on postural interventions for plane trip including utilization of towel behind back and scapular retractions.      Pt educated throughout session about proper posture and technique with exercises. Improved exercise technique, movement at target joints, use of target muscles after min to mod verbal, visual, tactile cues.    Patient presents with initial start of migraine. Her symptoms are not fully resolved but her upper trap tightness is released by end of session. Postural strengthening tolerated well in addition to manual for postural muscle correction. Next session will address goals as it is her progress note. She will benefit from skilled physical therapy to reduce pain, improve patient independence with symptom management, and improve quality of life                PT Education - 08/26/21 0748     Education provided Yes    Education Details exercise technique, body mechanics    Person(s) Educated Patient     Methods Explanation;Demonstration;Tactile cues;Verbal cues    Comprehension Verbalized understanding;Returned demonstration;Verbal cues required;Tactile cues required              PT Short Term Goals - 06/26/21 0806       PT SHORT TERM GOAL #1   Title Pt will be independent with HEP in order to decrease pain in order to improve pain-free function at home and work.    Baseline 6/7: HEP given 9/1: HEp compliant    Time 6    Period Weeks    Status Partially Met    Target Date 08/07/21               PT Long Term Goals - 06/26/21 0803       PT LONG TERM GOAL #1   Title Patient will increase FOTO score to equal to or greater than 59%      to demonstrate statistically significant improvement in mobility and quality of life.    Baseline 6/7: 50% 9/1: 59%    Time 12    Period Weeks    Status Partially Met    Target Date 09/18/21      PT LONG TERM GOAL #2   Title Pt will decrease worst pain as reported on NPRS by at least 3 points (3/10) in order to demonstrate clinically significant reduction in pain.    Baseline 6/7: 6/10 9/1: 4/10    Time 12    Period Weeks    Status Partially Met    Target Date 09/18/21      PT LONG TERM GOAL #3   Title Pt will demonstrate pain-free full range cervical motion in order to perform IADLs such as driving and household chores without increase in symptoms.    Baseline 6/7: see note for limitation 9/1:flexion 54, extension 60, SB R 35 L 30, rotation R 51 L 62    Time 12    Period Weeks    Status Partially Met    Target Date 09/18/21      PT LONG TERM GOAL #4   Title Patient will have less than two days a week of high pain migraines and demonstrate ability to self control/contain migraines.    Baseline 9/1:every day getting the mild ones, 2x/week for high pain migraines.    Time 12    Period Weeks    Status Partially Met    Target Date 09/18/21                   Plan - 08/26/21 0758     Clinical Impression Statement Patient  presents with initial start of migraine. Her symptoms are not fully resolved but her upper trap tightness is released by end of session. Postural strengthening tolerated well in addition to manual for postural muscle correction. Next session will address goals as it is her progress note. She will benefit from skilled physical therapy to reduce pain, improve patient independence with symptom management, and improve quality of life    Personal Factors and Comorbidities Age;Behavior Pattern;Comorbidity 3+;Fitness;Past/Current Experience;Profession;Time since onset of injury/illness/exacerbation    Comorbidities HLD, aortic atherosclerosis, asthma, COVID 19, SOB, hypothyroidism, atypical facial pain (2019), neuropathy (2020), fibromyalgia    Examination-Activity Limitations Caring for Others;Carry;Lift;Sleep;Transfers    Examination-Participation Restrictions Cleaning;Community Activity;Interpersonal Relationship;Driving;Laundry;Shop;Occupation;Meal Prep;Yard Work    [employer] Evolving/Moderate complexity    Rehab Potential Fair    PT Frequency Biweekly    PT Duration 12 weeks    PT Treatment/Interventions ADLs/Self Care Home Management;Dry needling;Manual techniques;Therapeutic exercise;Therapeutic activities;Neuromuscular re-education;Aquatic Therapy;Biofeedback;Canalith Repostioning;Cryotherapy;Electrical Stimulation;Iontophoresis 39m/ml Dexamethasone;Moist Heat;Ultrasound;Patient/family education;Passive range of motion;Energy conservation;Vestibular;Joint Manipulations;Traction;Gait training;Balance training;Taping;Spinal Manipulations;Visual/perceptual remediation/compensation    PT Next Visit Plan Perform trigger point dry needling and manual techniques until pt can tolerate active strengthening, additional chronic pain education    PT Home Exercise Plan see above    Consulted and Agree with Plan of Care Patient             Patient will benefit from skilled therapeutic  intervention in order to improve the following deficits and impairments:  Impaired perceived functional ability, Impaired UE functional use, Pain, Decreased mobility, Decreased range of motion, Decreased strength, Hypomobility, Impaired flexibility, Increased muscle spasms, Postural dysfunction, Improper body mechanics  Visit Diagnosis: Abnormal posture  Nonintractable headache, unspecified chronicity pattern, unspecified headache type  Cervicalgia     Problem List Patient Active Problem List   Diagnosis Date Noted  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 hauler 12/25/2020   Chronic urticaria 12/25/2020   Moderate persistent asthma, uncomplicated 12/25/2020   Ageusia 08/12/2020   Anosmia 08/12/2020   Myalgia, epidemic 08/12/2020   Cough with exposure to COVID-19 virus 08/12/2020   Olfactory impairment 05/29/2020   Acute UTI 04/23/2020   Left ureteral stone 04/23/2020   Intermittent chest pain 02/09/2020   History of COVID-19 02/09/2020   Shortness of breath 02/09/2020   Loss of taste 02/09/2020   Loss of smell 02/09/2020   Rash 02/09/2020   Arthralgia 02/09/2020   Postoperative history of checked last year 04/22/2017   Weakness of left arm 04/22/2017   Numbness and tingling in left arm 04/22/2017   Lower back pain 04/02/2016   Acute stress disorder 02/25/2015   Anxiety 02/25/2015   Airway hyperreactivity 02/25/2015   Bradycardia 02/25/2015   Hypersomnia 02/25/2015   Clinical depression 02/25/2015   Elevated blood sugar 02/25/2015   Fibrositis 02/25/2015   Acid reflux 02/25/2015   Hepatitis non A non B 02/25/2015   H/O disease 02/25/2015   Hypercholesteremia 02/25/2015   Headache, migraine 02/25/2015   Muscle ache 02/25/2015   Allergic rhinitis, seasonal 02/25/2015   Avitaminosis D 02/25/2015    Marina Moser, PT, DPT  08/26/2021, 8:00 AM  [facility] [address] [place], [place], [address] Phone: [phone]   Fax:   [phone]  Name: Rachel Fry MRN: [id number] Date of Birth: 11/20/1955    

## 2021-09-02 ENCOUNTER — Other Ambulatory Visit (HOSPITAL_COMMUNITY): Payer: Self-pay

## 2021-09-04 ENCOUNTER — Other Ambulatory Visit (HOSPITAL_COMMUNITY): Payer: Self-pay

## 2021-09-04 ENCOUNTER — Ambulatory Visit: Payer: 59

## 2021-09-05 ENCOUNTER — Other Ambulatory Visit (HOSPITAL_COMMUNITY): Payer: Self-pay

## 2021-09-05 MED FILL — Zafirlukast Tab 10 MG: ORAL | 90 days supply | Qty: 180 | Fill #1 | Status: AC

## 2021-09-08 ENCOUNTER — Other Ambulatory Visit (HOSPITAL_COMMUNITY): Payer: Self-pay

## 2021-09-08 MED ORDER — EZETIMIBE 10 MG PO TABS
10.0000 mg | ORAL_TABLET | Freq: Every day | ORAL | 3 refills | Status: DC
  Filled 2021-09-08: qty 90, 90d supply, fill #0
  Filled 2021-10-14 – 2021-12-13 (×2): qty 90, 90d supply, fill #1
  Filled 2022-03-08: qty 90, 90d supply, fill #2
  Filled 2022-05-17: qty 90, 90d supply, fill #3

## 2021-09-09 ENCOUNTER — Ambulatory Visit: Payer: 59

## 2021-09-09 ENCOUNTER — Ambulatory Visit (INDEPENDENT_AMBULATORY_CARE_PROVIDER_SITE_OTHER): Payer: 59 | Admitting: *Deleted

## 2021-09-09 ENCOUNTER — Other Ambulatory Visit: Payer: Self-pay

## 2021-09-09 DIAGNOSIS — R519 Headache, unspecified: Secondary | ICD-10-CM

## 2021-09-09 DIAGNOSIS — R293 Abnormal posture: Secondary | ICD-10-CM

## 2021-09-09 DIAGNOSIS — M542 Cervicalgia: Secondary | ICD-10-CM | POA: Diagnosis not present

## 2021-09-09 DIAGNOSIS — L501 Idiopathic urticaria: Secondary | ICD-10-CM | POA: Diagnosis not present

## 2021-09-09 NOTE — Therapy (Signed)
[place] MAIN [facility] SERVICES [address] [place], [place], [address] Phone: [phone]   Fax:  [phone]  Physical Therapy Treatment /Physical Therapy Progress Note/RECERT   Dates of reporting period  04/01/21   to   09/09/21   Patient Details  Name: Rachel Fry MRN: [id number] Date of Birth: [birth year] Referring Provider (PT): Dr. Karlton Lemon   Encounter Date: 09/09/2021   PT End of Session - 09/09/21 0850     Visit Number 10    Number of Visits 22    Date for PT Re-Evaluation 12/02/21    Authorization Type 10/10 eval 6/7; next session 1/10 PN 11/15    PT Start Time 0711    PT Stop Time 0757    PT Time Calculation (min) 46 min    Activity Tolerance Patient tolerated treatment well    Behavior During Therapy WFL for tasks assessed/performed             Past Medical History:  Diagnosis Date   Allergy    Asthma    COVID-19    History of kidney stones    Hypothyroidism    Migraines    Numbness and tingling    left side of body, occasionally right side    S/P Botox injection    Urticaria     Past Surgical History:  Procedure Laterality Date   ADENOIDECTOMY     ARM NEUROPLASTY Right 1994 and 1996   for chronic pain   CESAREAN SECTION     CYSTOSCOPY W/ RETROGRADES Left 04/23/2020   Procedure: CYSTOSCOPY WITH RETROGRADE PYELOGRAM;  Surgeon: Abbie Sons, MD;  Location: [facility];  Service: Urology;  Laterality: Left;   CYSTOSCOPY W/ RETROGRADES Left 05/13/2020   Procedure: CYSTOSCOPY WITH RETROGRADE PYELOGRAM;  Surgeon: Hollice Espy, MD;  Location: [facility];  Service: Urology;  Laterality: Left;   CYSTOSCOPY WITH STENT PLACEMENT Left 04/23/2020   Procedure: CYSTOSCOPY WITH STENT PLACEMENT;  Surgeon: Abbie Sons, MD;  Location: [facility];  Service: Urology;  Laterality: Left;   CYSTOSCOPY/URETEROSCOPY/HOLMIUM LASER/STENT PLACEMENT Left 05/13/2020   Procedure: CYSTOSCOPY/URETEROSCOPY/LASER/STENT EXCHANGE;  Surgeon:  Hollice Espy, MD;  Location: [facility];  Service: Urology;  Laterality: Left;   OVARIAN CYST REMOVAL  1973   STONE EXTRACTION WITH BASKET  05/13/2020   Procedure: STONE EXTRACTION WITH BASKET;  Surgeon: Hollice Espy, MD;  Location: [facility];  Service: Urology;;   TONSILLECTOMY     TONSILLECTOMY AND ADENOIDECTOMY     TUBAL LIGATION  1988    There were no vitals filed for this visit.   Subjective Assessment - 09/09/21 0718     Subjective Patient reports severe migraines are decreasing in frequency.    Pertinent History Patient is a 65 year old female who presents to physical therapy for migraines with specific referral for dry needling.  Patient has been having migraines for about 14-15 years. They have changed from front temporal all day long 2-3/10, a couple years ago started having tingling in left side of body, numbness in arm and leg. Additionally sometimes get's belly aches.  Changed medication in 2020. Was getting trigger injections but it was too expensive. PMH of HLD, aortic atherosclerosis, asthma, COVID 19, SOB, hypothyroidism, atypical facial pain (2019), neuropathy (2020), fibromyalgia    Limitations Reading;House hold activities;Other (comment)    How long can you sit comfortably? n/a    How long can you stand comfortably? n/a    How long can you walk comfortably? n/a  Diagnostic tests negative MRI for cervical, thoracic, brain    Patient Stated Goals to help manage symptoms    Currently in Pain? Yes    Pain Score 4     Pain Location Head    Pain Orientation Posterior    Pain Descriptors / Indicators Aching    Pain Type Neuropathic pain    Pain Onset More than a month ago    Pain Frequency Constant                 Goals:  FOTO: 86%  MET  VAS: 5/10  Cervical ROM    Right Left  Flexion 58  Extension 60  Side Bending 35 35  Rotation 59 60    Less than 2 days high pain: twice this month with severe pain; has daily mild migraines      Treatment Suboccipital release 4x30 seconds J mobilization for postural kyphosis reduction 4x 30 seconds cervical mobilizations grade II for reduced hypomobility x 4 minutes     Trigger Point Dry Needling (TDN), unbilled Education performed with patient regarding potential benefit of TDN. Reviewed precautions and risks with patient. Reviewed special precautions/risks over lung fields which include pneumothorax. Reviewed signs and symptoms of pneumothorax and advised pt to go to ER immediately if these symptoms develop advise them of dry needling treatment. Extensive time spent with pt to ensure full understanding of TDN risks. Pt provided verbal consent to treatment. TDN performed to  with 0.30 x 30 and .2 x 0.3 single needle placements with local twitch response (LTR). Pistoning technique utilized. Improved pain-free motion following intervention. L and Rcervical paraspinals, Upper trap (L and R), temporalis, suboccipital musculature  x 18 minutes       Pt educated throughout session about proper posture and technique with exercises. Improved exercise technique, movement at target joints, use of target muscles after min to mod verbal, visual, tactile cues.   Patient's condition has the potential to improve in response to therapy. Maximum improvement is yet to be obtained. The anticipated improvement is attainable and reasonable in a generally predictable time.  Patient reports her headaches are less severe and less frequent.    Patient has made significant progress and has improved quality of life since starting therapy. Patient has met her FOTO goal and significantly improved the frequency of migraines decreasing to multiple a week of major migraines to only 2 major ones in past month. She does continue to have minor headaches daily. Her cervical ROM is improving with continued limitations in lateral movements. L upper trap continues to be highly involved relying extensive needling. Patient  continues to have excellent prognosis at this time. Patient's condition has the potential to improve in response to therapy. Maximum improvement is yet to be obtained. The anticipated improvement is attainable and reasonable in a generally predictable time. She will benefit from skilled physical therapy to reduce pain, improve patient independence with symptom management, and improve quality of life                  PT Education - 09/09/21 0850     Education provided Yes    Education Details exercise technique, goals, progress note    Person(s) Educated Patient    Methods Explanation;Demonstration;Tactile cues;Verbal cues    Comprehension Verbalized understanding;Returned demonstration;Verbal cues required;Tactile cues required              PT Short Term Goals - 09/09/21 0858       PT SHORT TERM GOAL #  1   Title Pt will be independent with HEP in order to decrease pain in order to improve pain-free function at home and work.    Baseline 6/7: HEP given 9/1: HEp compliant 11/15: HEP compliant    Time 6    Period Weeks    Status Achieved    Target Date 08/07/21               PT Long Term Goals - 09/09/21 0858       PT LONG TERM GOAL #1   Title Patient will increase FOTO score to equal to or greater than 59%    to demonstrate statistically significant improvement in mobility and quality of life.    Baseline 6/7: 50% 9/1: 59% 11/15: 86%    Time 12    Period Weeks    Status Achieved      PT LONG TERM GOAL #2   Title Pt will decrease worst pain as reported on NPRS by at least 3 points (3/10) in order to demonstrate clinically significant reduction in pain.    Baseline 6/7: 6/10 9/1: 4/10 11/15: 5/10    Time 12    Period Weeks    Status Partially Met    Target Date 12/02/21      PT LONG TERM GOAL #3   Title Pt will demonstrate pain-free full range cervical motion in order to perform IADLs such as driving and household chores without increase in symptoms.     Baseline 6/7: see note for limitation 9/1:flexion 54, extension 60, SB R 35 L 30, rotation R 51 L 62 11/15: flexion 58, extension 60, SB R and L 35, R rotation 59 L rotation 60    Time 12    Period Weeks    Status Partially Met    Target Date 12/02/21      PT LONG TERM GOAL #4   Title Patient will have less than two days a week of high pain migraines and demonstrate ability to self control/contain migraines.    Baseline 9/1:every day getting the mild ones, 2x/week for high pain migraines. 11/15: two days of high pain in past month, has mini headaches every day    Time 12    Period Weeks    Status Partially Met    Target Date 12/02/21                   Plan - 09/09/21 3716     Clinical Impression Statement Patient has made significant progress and has improved quality of life since starting therapy. Patient has met her FOTO goal and significantly improved the frequency of migraines decreasing to multiple a week of major migraines to only 2 major ones in past month. She does continue to have minor headaches daily. Her cervical ROM is improving with continued limitations in lateral movements. L upper trap continues to be highly involved relying extensive needling. Patient continues to have excellent prognosis at this time. Patient's condition has the potential to improve in response to therapy. Maximum improvement is yet to be obtained. The anticipated improvement is attainable and reasonable in a generally predictable time. She will benefit from skilled physical therapy to reduce pain, improve patient independence with symptom management, and improve quality of life    Personal Factors and Comorbidities Age;Behavior Pattern;Comorbidity 3+;Fitness;Past/Current Experience;Profession;Time since onset of injury/illness/exacerbation    Comorbidities HLD, aortic atherosclerosis, asthma, COVID 19, SOB, hypothyroidism, atypical facial pain (2019), neuropathy (2020), fibromyalgia     Examination-Activity Limitations Caring for  Others;Carry;Lift;Sleep;Transfers    Examination-Participation Restrictions Cleaning;Community Activity;Interpersonal Relationship;Driving;Laundry;Shop;Occupation;Meal Prep;Yard Work    [employer] Evolving/Moderate complexity    Rehab Potential Fair    PT Frequency Biweekly    PT Duration 12 weeks    PT Treatment/Interventions ADLs/Self Care Home Management;Dry needling;Manual techniques;Therapeutic exercise;Therapeutic activities;Neuromuscular re-education;Aquatic Therapy;Biofeedback;Canalith Repostioning;Cryotherapy;Electrical Stimulation;Iontophoresis 29m/ml Dexamethasone;Moist Heat;Ultrasound;Patient/family education;Passive range of motion;Energy conservation;Vestibular;Joint Manipulations;Traction;Gait training;Balance training;Taping;Spinal Manipulations;Visual/perceptual remediation/compensation    PT Next Visit Plan Perform trigger point dry needling and manual techniques until pt can tolerate active strengthening, additional chronic pain education    PT Home Exercise Plan see above    Consulted and Agree with Plan of Care Patient             Patient will benefit from skilled therapeutic intervention in order to improve the following deficits and impairments:  Impaired perceived functional ability, Impaired UE functional use, Pain, Decreased mobility, Decreased range of motion, Decreased strength, Hypomobility, Impaired flexibility, Increased muscle spasms, Postural dysfunction, Improper body mechanics  Visit Diagnosis: Abnormal posture  Nonintractable headache, unspecified chronicity pattern, unspecified headache type  Cervicalgia     Problem List Patient Active Problem List   Diagnosis Date Noted   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 hauler 12/25/2020   Chronic urticaria 12/25/2020   Moderate persistent asthma, uncomplicated 067/89/3810  Ageusia 08/12/2020   Anosmia 08/12/2020   Myalgia, epidemic 08/12/2020   Cough with  exposure to COVID-19 virus 08/12/2020   Olfactory impairment 05/29/2020   Acute UTI 04/23/2020   Left ureteral stone 04/23/2020   Intermittent chest pain 02/09/2020   History of COVID-19 02/09/2020   Shortness of breath 02/09/2020   Loss of taste 02/09/2020   Loss of smell 02/09/2020   Rash 02/09/2020   Arthralgia 02/09/2020   Postoperative history of checked last year 04/22/2017   Weakness of left arm 04/22/2017   Numbness and tingling in left arm 04/22/2017   Lower back pain 04/02/2016   Acute stress disorder 02/25/2015   Anxiety 02/25/2015   Airway hyperreactivity 02/25/2015   Bradycardia 02/25/2015   Hypersomnia 02/25/2015   Clinical depression 02/25/2015   Elevated blood sugar 02/25/2015   Fibrositis 02/25/2015   Acid reflux 02/25/2015   Hepatitis non A non B 02/25/2015   H/O disease 02/25/2015   Hypercholesteremia 02/25/2015   Headache, migraine 02/25/2015   Muscle ache 02/25/2015   Allergic rhinitis, seasonal 02/25/2015   Avitaminosis D 02/25/2015    MJanna Arch PT, DPT  09/09/2021, 9:04 AM   [facility]MAIN RSayre [facility] HospitalSERVICES [address]RT[address] [address]Phone: [phone]  Fax:  [phone] Name: SCARMINE YOUNGBERGMRN: [id number] of Birth: 805/31/1957

## 2021-09-10 ENCOUNTER — Other Ambulatory Visit (HOSPITAL_COMMUNITY): Payer: Self-pay

## 2021-09-12 ENCOUNTER — Other Ambulatory Visit (HOSPITAL_COMMUNITY): Payer: Self-pay

## 2021-09-15 ENCOUNTER — Ambulatory Visit: Payer: 59

## 2021-09-15 ENCOUNTER — Other Ambulatory Visit (HOSPITAL_COMMUNITY): Payer: Self-pay

## 2021-09-22 ENCOUNTER — Other Ambulatory Visit (HOSPITAL_COMMUNITY): Payer: Self-pay

## 2021-09-22 MED FILL — Levothyroxine Sodium Tab 50 MCG: ORAL | 90 days supply | Qty: 90 | Fill #2 | Status: AC

## 2021-09-23 ENCOUNTER — Ambulatory Visit: Payer: 59

## 2021-09-23 ENCOUNTER — Other Ambulatory Visit: Payer: Self-pay

## 2021-09-23 DIAGNOSIS — R293 Abnormal posture: Secondary | ICD-10-CM | POA: Diagnosis not present

## 2021-09-23 DIAGNOSIS — M542 Cervicalgia: Secondary | ICD-10-CM

## 2021-09-23 DIAGNOSIS — R519 Headache, unspecified: Secondary | ICD-10-CM | POA: Diagnosis not present

## 2021-09-23 NOTE — Therapy (Signed)
[place] MAIN [facility] SERVICES [address] [place], [place], [address] Phone: [phone]   Fax:  [phone]  Physical Therapy Treatment  Patient Details  Name: Rachel Fry MRN: [id number] Date of Birth: Mar 15, 1956 Referring Provider (PT): Dr. Karlton Lemon   Encounter Date: 09/23/2021   PT End of Session - 09/23/21 0715     Visit Number 11    Number of Visits 16    Date for PT Re-Evaluation 12/02/21    Authorization Type 1/10 PN 11/15    PT Start Time 0715    PT Stop Time 0759    PT Time Calculation (min) 44 min    Activity Tolerance Patient tolerated treatment well    Behavior During Therapy [facility] for tasks assessed/performed             Past Medical History:  Diagnosis Date   Allergy    Asthma    COVID-19    History of kidney stones    Hypothyroidism    Migraines    Numbness and tingling    left side of body, occasionally right side    S/P Botox injection    Urticaria     Past Surgical History:  Procedure Laterality Date   ADENOIDECTOMY     ARM NEUROPLASTY Right 1994 and 1996   for chronic pain   CESAREAN SECTION     CYSTOSCOPY W/ RETROGRADES Left 04/23/2020   Procedure: CYSTOSCOPY WITH RETROGRADE PYELOGRAM;  Surgeon: Abbie Sons, MD;  Location: [facility];  Service: Urology;  Laterality: Left;   CYSTOSCOPY W/ RETROGRADES Left 05/13/2020   Procedure: CYSTOSCOPY WITH RETROGRADE PYELOGRAM;  Surgeon: Hollice Espy, MD;  Location: [facility];  Service: Urology;  Laterality: Left;   CYSTOSCOPY WITH STENT PLACEMENT Left 04/23/2020   Procedure: CYSTOSCOPY WITH STENT PLACEMENT;  Surgeon: Abbie Sons, MD;  Location: [facility];  Service: Urology;  Laterality: Left;   CYSTOSCOPY/URETEROSCOPY/HOLMIUM LASER/STENT PLACEMENT Left 05/13/2020   Procedure: CYSTOSCOPY/URETEROSCOPY/LASER/STENT EXCHANGE;  Surgeon: Hollice Espy, MD;  Location: [facility];  Service: Urology;  Laterality: Left;   OVARIAN CYST REMOVAL  1973   STONE  EXTRACTION WITH BASKET  05/13/2020   Procedure: STONE EXTRACTION WITH BASKET;  Surgeon: Hollice Espy, MD;  Location: [facility];  Service: Urology;;   TONSILLECTOMY     TONSILLECTOMY AND ADENOIDECTOMY     TUBAL LIGATION  1988    There were no vitals filed for this visit.   Subjective Assessment - 09/23/21 0759     Subjective Patient reports she had a 3 day headache but it went away. Has start of a headache this morning.    Pertinent History Patient is a 65 year old female who presents to physical therapy for migraines with specific referral for dry needling.  Patient has been having migraines for about 14-15 years. They have changed from front temporal all day long 2-3/10, a couple years ago started having tingling in left side of body, numbness in arm and leg. Additionally sometimes get's belly aches.  Changed medication in 2020. Was getting trigger injections but it was too expensive. PMH of HLD, aortic atherosclerosis, asthma, COVID 19, SOB, hypothyroidism, atypical facial pain (2019), neuropathy (2020), fibromyalgia    Limitations Reading;House hold activities;Other (comment)    How long can you sit comfortably? n/a    How long can you stand comfortably? n/a    How long can you walk comfortably? n/a    Diagnostic tests negative MRI for cervical, thoracic, brain    Patient  Stated Goals to help manage symptoms    Currently in Pain? Yes    Pain Score 1     Pain Location Head    Pain Orientation Posterior    Pain Descriptors / Indicators Aching    Pain Type Neuropathic pain    Pain Onset More than a month ago    Pain Frequency Intermittent                    Treatment Cervical side bend with overpressure to glenohumeral joint 5x 30 seconds Cervical rotation with overpressure to glenohumeral joint: 5x 30 seconds Suboccipital release 4x30 seconds J mobilization for postural kyphosis reduction 4x 30 seconds cervical and thoracic mobilizations grade II for reduced  hypomobility x 6 minutes      Trigger Point Dry Needling (TDN), unbilled Education performed with patient regarding potential benefit of TDN. Reviewed precautions and risks with patient. Reviewed special precautions/risks over lung fields which include pneumothorax. Reviewed signs and symptoms of pneumothorax and advised pt to go to ER immediately if these symptoms develop advise them of dry needling treatment. Extensive time spent with pt to ensure full understanding of TDN risks. Pt provided verbal consent to treatment. TDN performed to  with 0.30 x 30 and .2 x 0.3 single needle placements with local twitch response (LTR). Pistoning technique utilized. Improved pain-free motion following intervention. L and Rcervical paraspinals, Upper trap (L and R), temporalis, suboccipital musculature  x 20 minutes     Patient educated on postural interventions for plane trip including utilization of towel behind back and scapular retractions.      Pt educated throughout session about proper posture and technique with exercises. Improved exercise technique, movement at target joints, use of target muscles after min to mod verbal, visual, tactile cues.      Patient has multiple trigger point throughout upper trap region as well as R suboccipital. Patient reports relief of upcoming migraine by end of session with extensive dry needling and manual. Hypomobility in thoracic and cervical spine reduced with repeated grade II mobilizations allowing for improved posture. She will benefit from skilled physical therapy to reduce pain, improve patient independence with symptom management, and improve quality of life                  PT Education - 09/23/21 0714     Education provided Yes    Education Details exercise technique, body mechanics    Person(s) Educated Patient    Methods Explanation;Demonstration;Tactile cues;Verbal cues    Comprehension Verbalized understanding;Returned  demonstration;Verbal cues required;Tactile cues required              PT Short Term Goals - 09/09/21 0858       PT SHORT TERM GOAL #1   Title Pt will be independent with HEP in order to decrease pain in order to improve pain-free function at home and work.    Baseline 6/7: HEP given 9/1: HEp compliant 11/15: HEP compliant    Time 6    Period Weeks    Status Achieved    Target Date 08/07/21               PT Long Term Goals - 09/09/21 0858       PT LONG TERM GOAL #1   Title Patient will increase FOTO score to equal to or greater than 59%    to demonstrate statistically significant improvement in mobility and quality of life.    Baseline 6/7: 50% 9/1: 59%  11/15: 86%    Time 12    Period Weeks    Status Achieved      PT LONG TERM GOAL #2   Title Pt will decrease worst pain as reported on NPRS by at least 3 points (3/10) in order to demonstrate clinically significant reduction in pain.    Baseline 6/7: 6/10 9/1: 4/10 11/15: 5/10    Time 12    Period Weeks    Status Partially Met    Target Date 12/02/21      PT LONG TERM GOAL #3   Title Pt will demonstrate pain-free full range cervical motion in order to perform IADLs such as driving and household chores without increase in symptoms.    Baseline 6/7: see note for limitation 9/1:flexion 54, extension 60, SB R 35 L 30, rotation R 51 L 62 11/15: flexion 58, extension 60, SB R and L 35, R rotation 59 L rotation 60    Time 12    Period Weeks    Status Partially Met    Target Date 12/02/21      PT LONG TERM GOAL #4   Title Patient will have less than two days a week of high pain migraines and demonstrate ability to self control/contain migraines.    Baseline 9/1:every day getting the mild ones, 2x/week for high pain migraines. 11/15: two days of high pain in past month, has mini headaches every day    Time 12    Period Weeks    Status Partially Met    Target Date 12/02/21                   Plan - 09/23/21  3545     Clinical Impression Statement Patient has multiple trigger point throughout upper trap region as well as R suboccipital. Patient reports relief of upcoming migraine by end of session with extensive dry needling and manual. Hypomobility in thoracic and cervical spine reduced with repeated grade II mobilizations allowing for improved posture. She will benefit from skilled physical therapy to reduce pain, improve patient independence with symptom management, and improve quality of life    Personal Factors and Comorbidities Age;Behavior Pattern;Comorbidity 3+;Fitness;Past/Current Experience;Profession;Time since onset of injury/illness/exacerbation    Comorbidities HLD, aortic atherosclerosis, asthma, COVID 19, SOB, hypothyroidism, atypical facial pain (2019), neuropathy (2020), fibromyalgia    Examination-Activity Limitations Caring for Others;Carry;Lift;Sleep;Transfers    Examination-Participation Restrictions Cleaning;Community Activity;Interpersonal Relationship;Driving;Laundry;Shop;Occupation;Meal Prep;Yard Work    [employer] officer Evolving/Moderate complexity    Rehab Potential Fair    PT Frequency Biweekly    PT Duration 12 weeks    PT Treatment/Interventions ADLs/Self Care Home Management;Dry needling;Manual techniques;Therapeutic exercise;Therapeutic activities;Neuromuscular re-education;Aquatic Therapy;Biofeedback;Canalith Repostioning;Cryotherapy;Electrical Stimulation;Iontophoresis 47m/ml Dexamethasone;Moist Heat;Ultrasound;Patient/family education;Passive range of motion;Energy conservation;Vestibular;Joint Manipulations;Traction;Gait training;Balance training;Taping;Spinal Manipulations;Visual/perceptual remediation/compensation    PT Next Visit Plan Perform trigger point dry needling and manual techniques until pt can tolerate active strengthening, additional chronic pain education    PT Home Exercise Plan see above    Consulted and Agree with Plan of Care Patient              Patient will benefit from skilled therapeutic intervention in order to improve the following deficits and impairments:  Impaired perceived functional ability, Impaired UE functional use, Pain, Decreased mobility, Decreased range of motion, Decreased strength, Hypomobility, Impaired flexibility, Increased muscle spasms, Postural dysfunction, Improper body mechanics  Visit Diagnosis: Abnormal posture  Nonintractable headache, unspecified chronicity pattern, unspecified headache type  Cervicalgia     Problem List Patient Active  Problem List   Diagnosis Date Noted   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 hauler 12/25/2020   Chronic urticaria 12/25/2020   Moderate persistent asthma, uncomplicated 62/94/7654   Ageusia 08/12/2020   Anosmia 08/12/2020   Myalgia, epidemic 08/12/2020   Cough with exposure to COVID-19 virus 08/12/2020   Olfactory impairment 05/29/2020   Acute UTI 04/23/2020   Left ureteral stone 04/23/2020   Intermittent chest pain 02/09/2020   History of COVID-19 02/09/2020   Shortness of breath 02/09/2020   Loss of taste 02/09/2020   Loss of smell 02/09/2020   Rash 02/09/2020   Arthralgia 02/09/2020   Postoperative history of checked last year 04/22/2017   Weakness of left arm 04/22/2017   Numbness and tingling in left arm 04/22/2017   Lower back pain 04/02/2016   Acute stress disorder 02/25/2015   Anxiety 02/25/2015   Airway hyperreactivity 02/25/2015   Bradycardia 02/25/2015   Hypersomnia 02/25/2015   Clinical depression 02/25/2015   Elevated blood sugar 02/25/2015   Fibrositis 02/25/2015   Acid reflux 02/25/2015   Hepatitis non A non B 02/25/2015   H/O disease 02/25/2015   Hypercholesteremia 02/25/2015   Headache, migraine 02/25/2015   Muscle ache 02/25/2015   Allergic rhinitis, seasonal 02/25/2015   Avitaminosis D 02/25/2015   Janna Arch, PT, DPT  09/23/2021, 9:19 AM  [place] [facility] MAIN [facility] SERVICES [address]  [place], [place], [address] Phone: [phone]   Fax:  [phone]  Name: Rachel Fry MRN: [id number] Date of Birth: 01-24-1956

## 2021-09-28 ENCOUNTER — Other Ambulatory Visit (HOSPITAL_COMMUNITY): Payer: Self-pay

## 2021-09-29 ENCOUNTER — Other Ambulatory Visit (HOSPITAL_COMMUNITY): Payer: Self-pay

## 2021-09-30 ENCOUNTER — Other Ambulatory Visit (HOSPITAL_COMMUNITY): Payer: Self-pay

## 2021-10-02 ENCOUNTER — Ambulatory Visit: Payer: 59

## 2021-10-06 ENCOUNTER — Other Ambulatory Visit (HOSPITAL_COMMUNITY): Payer: Self-pay

## 2021-10-07 ENCOUNTER — Ambulatory Visit: Payer: 59

## 2021-10-10 ENCOUNTER — Ambulatory Visit (INDEPENDENT_AMBULATORY_CARE_PROVIDER_SITE_OTHER): Payer: 59

## 2021-10-10 ENCOUNTER — Other Ambulatory Visit: Payer: Self-pay

## 2021-10-10 DIAGNOSIS — L501 Idiopathic urticaria: Secondary | ICD-10-CM | POA: Diagnosis not present

## 2021-10-14 ENCOUNTER — Other Ambulatory Visit (HOSPITAL_COMMUNITY): Payer: Self-pay

## 2021-10-15 ENCOUNTER — Other Ambulatory Visit: Payer: Self-pay

## 2021-10-15 ENCOUNTER — Ambulatory Visit: Payer: 59

## 2021-10-15 ENCOUNTER — Ambulatory Visit: Payer: 59 | Attending: Internal Medicine

## 2021-10-15 DIAGNOSIS — M542 Cervicalgia: Secondary | ICD-10-CM | POA: Diagnosis not present

## 2021-10-15 DIAGNOSIS — R293 Abnormal posture: Secondary | ICD-10-CM

## 2021-10-15 DIAGNOSIS — R519 Headache, unspecified: Secondary | ICD-10-CM | POA: Diagnosis not present

## 2021-10-15 NOTE — Therapy (Signed)
[place] MAIN [facility] SERVICES [address] [place], [place], [address] Phone: [phone]   Fax:  [phone]  Physical Therapy Treatment  Patient Details  Name: Rachel Fry MRN: [id number] Date of Birth: April 20, 1956 Referring Provider (PT): Dr. Karlton Lemon   Encounter Date: 10/15/2021   PT End of Session - 10/15/21 0842     Visit Number 12    Number of Visits 16    Date for PT Re-Evaluation 12/02/21    Authorization Type 2/10 PN 11/15    PT Start Time 0845    PT Stop Time 0929    PT Time Calculation (min) 44 min    Activity Tolerance Patient tolerated treatment well    Behavior During Therapy [facility] for tasks assessed/performed             Past Medical History:  Diagnosis Date   Allergy    Asthma    COVID-19    History of kidney stones    Hypothyroidism    Migraines    Numbness and tingling    left side of body, occasionally right side    S/P Botox injection    Urticaria     Past Surgical History:  Procedure Laterality Date   ADENOIDECTOMY     ARM NEUROPLASTY Right 1994 and 1996   for chronic pain   CESAREAN SECTION     CYSTOSCOPY W/ RETROGRADES Left 04/23/2020   Procedure: CYSTOSCOPY WITH RETROGRADE PYELOGRAM;  Surgeon: Abbie Sons, MD;  Location: [facility];  Service: Urology;  Laterality: Left;   CYSTOSCOPY W/ RETROGRADES Left 05/13/2020   Procedure: CYSTOSCOPY WITH RETROGRADE PYELOGRAM;  Surgeon: Hollice Espy, MD;  Location: [facility];  Service: Urology;  Laterality: Left;   CYSTOSCOPY WITH STENT PLACEMENT Left 04/23/2020   Procedure: CYSTOSCOPY WITH STENT PLACEMENT;  Surgeon: Abbie Sons, MD;  Location: [facility];  Service: Urology;  Laterality: Left;   CYSTOSCOPY/URETEROSCOPY/HOLMIUM LASER/STENT PLACEMENT Left 05/13/2020   Procedure: CYSTOSCOPY/URETEROSCOPY/LASER/STENT EXCHANGE;  Surgeon: Hollice Espy, MD;  Location: [facility];  Service: Urology;  Laterality: Left;   OVARIAN CYST REMOVAL  1973   STONE  EXTRACTION WITH BASKET  05/13/2020   Procedure: STONE EXTRACTION WITH BASKET;  Surgeon: Hollice Espy, MD;  Location: [facility];  Service: Urology;;   TONSILLECTOMY     TONSILLECTOMY AND ADENOIDECTOMY     TUBAL LIGATION  1988    There were no vitals filed for this visit.   Subjective Assessment - 10/15/21 0917     Subjective Patient reports her headaches have increased this past week. Has had to take her medicine twice last week and yesterday. More on the left side, reports neck feels tight. Pain level is a 2/10 at the moment but has increased to 4-5/10.    Pertinent History Patient is a 65 year old female who presents to physical therapy for migraines with specific referral for dry needling.  Patient has been having migraines for about 14-15 years. They have changed from front temporal all day long 2-3/10, a couple years ago started having tingling in left side of body, numbness in arm and leg. Additionally sometimes get's belly aches.  Changed medication in 2020. Was getting trigger injections but it was too expensive. PMH of HLD, aortic atherosclerosis, asthma, COVID 19, SOB, hypothyroidism, atypical facial pain (2019), neuropathy (2020), fibromyalgia    Limitations Reading;House hold activities;Other (comment)    How long can you sit comfortably? n/a    How long can you stand comfortably? n/a  How long can you walk comfortably? n/a    Diagnostic tests negative MRI for cervical, thoracic, brain    Patient Stated Goals to help manage symptoms    Currently in Pain? Yes    Pain Score 4     Pain Location Head    Pain Orientation Lateral    Pain Descriptors / Indicators Aching    Pain Type Neuropathic pain    Pain Onset More than a month ago    Pain Frequency Intermittent              Patient reports her headaches have increased this past week. Has had to take her medicine twice last week and yesterday. More on the left side, reports neck feels tight. Pain level is a 2/10 at the  moment but has increased to 4-5/10.    Treatment Cervical side bend with overpressure to glenohumeral joint 5x 30 seconds Cervical rotation with overpressure to glenohumeral joint: 5x 30 seconds Suboccipital release 4x30 seconds J mobilization for postural kyphosis reduction 4x 30 seconds cervical and thoracic mobilizations grade II for reduced hypomobility x 6 minutes  RTB ER 15x RTB overhead Y 15x Chin tuck 10x 3 second holds  Seated upper trap stretch 30 seconds each direction  Wall posture 3x30 second holds      Trigger Point Dry Needling (TDN), unbilled Education performed with patient regarding potential benefit of TDN. Reviewed precautions and risks with patient. Reviewed special precautions/risks over lung fields which include pneumothorax. Reviewed signs and symptoms of pneumothorax and advised pt to go to ER immediately if these symptoms develop advise them of dry needling treatment. Extensive time spent with pt to ensure full understanding of TDN risks. Pt provided verbal consent to treatment. TDN performed to  with 0.30 x 30 and .2 x 0.3 single needle placements with local twitch response (LTR). Pistoning technique utilized. Improved pain-free motion following intervention. L and Rcervical paraspinals, Upper trap (L and R), temporalis, suboccipital musculature  x 14 minutes        Pt educated throughout session about proper posture and technique with exercises. Improved exercise technique, movement at target joints, use of target muscles after min to mod verbal, visual, tactile cues.     Patient missed initial time but was able to be fit into additional slot for full session. Patient tolerated strengthening techniques after TDN and manual interventions for postural correction. Hypomobility in thoracic and cervical spine reduced with repeated grade II mobilizations allowing for improved posture. She will benefit from skilled physical therapy to reduce pain, improve patient  independence with symptom management, and improve quality of life                    PT Education - 10/15/21 0842     Education provided Yes    Education Details TDN, pain control    Person(s) Educated Patient    Methods Explanation;Demonstration;Tactile cues;Verbal cues    Comprehension Verbalized understanding;Returned demonstration;Verbal cues required;Tactile cues required              PT Short Term Goals - 09/09/21 0858       PT SHORT TERM GOAL #1   Title Pt will be independent with HEP in order to decrease pain in order to improve pain-free function at home and work.    Baseline 6/7: HEP given 9/1: HEp compliant 11/15: HEP compliant    Time 6    Period Weeks    Status Achieved    Target Date 08/07/21  PT Long Term Goals - 09/09/21 0858       PT LONG TERM GOAL #1   Title Patient will increase FOTO score to equal to or greater than 59%    to demonstrate statistically significant improvement in mobility and quality of life.    Baseline 6/7: 50% 9/1: 59% 11/15: 86%    Time 12    Period Weeks    Status Achieved      PT LONG TERM GOAL #2   Title Pt will decrease worst pain as reported on NPRS by at least 3 points (3/10) in order to demonstrate clinically significant reduction in pain.    Baseline 6/7: 6/10 9/1: 4/10 11/15: 5/10    Time 12    Period Weeks    Status Partially Met    Target Date 12/02/21      PT LONG TERM GOAL #3   Title Pt will demonstrate pain-free full range cervical motion in order to perform IADLs such as driving and household chores without increase in symptoms.    Baseline 6/7: see note for limitation 9/1:flexion 54, extension 60, SB R 35 L 30, rotation R 51 L 62 11/15: flexion 58, extension 60, SB R and L 35, R rotation 59 L rotation 60    Time 12    Period Weeks    Status Partially Met    Target Date 12/02/21      PT LONG TERM GOAL #4   Title Patient will have less than two days a week of high pain  migraines and demonstrate ability to self control/contain migraines.    Baseline 9/1:every day getting the mild ones, 2x/week for high pain migraines. 11/15: two days of high pain in past month, has mini headaches every day    Time 12    Period Weeks    Status Partially Met    Target Date 12/02/21                   Plan - 10/15/21 1144     Clinical Impression Statement Patient missed initial time but was able to be fit into additional slot for full session. Patient tolerated strengthening techniques after TDN and manual interventions for postural correction. Hypomobility in thoracic and cervical spine reduced with repeated grade II mobilizations allowing for improved posture. She will benefit from skilled physical therapy to reduce pain, improve patient independence with symptom management, and improve quality of life    Personal Factors and Comorbidities Age;Behavior Pattern;Comorbidity 3+;Fitness;Past/Current Experience;Profession;Time since onset of injury/illness/exacerbation    Comorbidities HLD, aortic atherosclerosis, asthma, COVID 19, SOB, hypothyroidism, atypical facial pain (2019), neuropathy (2020), fibromyalgia    Examination-Activity Limitations Caring for Others;Carry;Lift;Sleep;Transfers    Examination-Participation Restrictions Cleaning;Community Activity;Interpersonal Relationship;Driving;Laundry;Shop;Occupation;Meal Prep;Yard Work    [employer] officer Evolving/Moderate complexity    Rehab Potential Fair    PT Frequency Biweekly    PT Duration 12 weeks    PT Treatment/Interventions ADLs/Self Care Home Management;Dry needling;Manual techniques;Therapeutic exercise;Therapeutic activities;Neuromuscular re-education;Aquatic Therapy;Biofeedback;Canalith Repostioning;Cryotherapy;Electrical Stimulation;Iontophoresis 26m/ml Dexamethasone;Moist Heat;Ultrasound;Patient/family education;Passive range of motion;Energy conservation;Vestibular;Joint  Manipulations;Traction;Gait training;Balance training;Taping;Spinal Manipulations;Visual/perceptual remediation/compensation    PT Next Visit Plan Perform trigger point dry needling and manual techniques until pt can tolerate active strengthening, additional chronic pain education    PT Home Exercise Plan see above    Consulted and Agree with Plan of Care Patient             Patient will benefit from skilled therapeutic intervention in order to improve the following deficits and impairments:  Impaired perceived functional ability, Impaired UE functional use, Pain, Decreased mobility, Decreased range of motion, Decreased strength, Hypomobility, Impaired flexibility, Increased muscle spasms, Postural dysfunction, Improper body mechanics  Visit Diagnosis: Abnormal posture  Nonintractable headache, unspecified chronicity pattern, unspecified headache type  Cervicalgia     Problem List Patient Active Problem List   Diagnosis Date Noted   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 hauler 12/25/2020   Chronic urticaria 12/25/2020   Moderate persistent asthma, uncomplicated 28/97/9150   Ageusia 08/12/2020   Anosmia 08/12/2020   Myalgia, epidemic 08/12/2020   Cough with exposure to COVID-19 virus 08/12/2020   Olfactory impairment 05/29/2020   Acute UTI 04/23/2020   Left ureteral stone 04/23/2020   Intermittent chest pain 02/09/2020   History of COVID-19 02/09/2020   Shortness of breath 02/09/2020   Loss of taste 02/09/2020   Loss of smell 02/09/2020   Rash 02/09/2020   Arthralgia 02/09/2020   Postoperative history of checked last year 04/22/2017   Weakness of left arm 04/22/2017   Numbness and tingling in left arm 04/22/2017   Lower back pain 04/02/2016   Acute stress disorder 02/25/2015   Anxiety 02/25/2015   Airway hyperreactivity 02/25/2015   Bradycardia 02/25/2015   Hypersomnia 02/25/2015   Clinical depression 02/25/2015   Elevated blood sugar 02/25/2015   Fibrositis 02/25/2015   Acid reflux  02/25/2015   Hepatitis non A non B 02/25/2015   H/O disease 02/25/2015   Hypercholesteremia 02/25/2015   Headache, migraine 02/25/2015   Muscle ache 02/25/2015   Allergic rhinitis, seasonal 02/25/2015   Avitaminosis D 02/25/2015    Janna Arch, PT, DPT  10/15/2021, 11:45 AM  [place] [facility] MAIN [facility] SERVICES [address] [place], [place], [address] Phone: [phone]   Fax:  [phone]  Name: MAHARI VANKIRK MRN: [id number] Date of Birth: 10-29-1955

## 2021-10-16 ENCOUNTER — Other Ambulatory Visit (HOSPITAL_COMMUNITY): Payer: Self-pay

## 2021-10-16 ENCOUNTER — Ambulatory Visit: Payer: 59

## 2021-10-19 MED FILL — Zafirlukast Tab 10 MG: ORAL | 90 days supply | Qty: 180 | Fill #2 | Status: CN

## 2021-10-21 ENCOUNTER — Ambulatory Visit: Payer: 59

## 2021-10-21 ENCOUNTER — Other Ambulatory Visit (HOSPITAL_COMMUNITY): Payer: Self-pay

## 2021-10-23 ENCOUNTER — Ambulatory Visit: Payer: 59

## 2021-10-23 ENCOUNTER — Other Ambulatory Visit (HOSPITAL_COMMUNITY): Payer: Self-pay

## 2021-10-28 ENCOUNTER — Ambulatory Visit: Payer: 59 | Attending: Internal Medicine

## 2021-10-28 ENCOUNTER — Other Ambulatory Visit: Payer: Self-pay

## 2021-10-28 DIAGNOSIS — R519 Headache, unspecified: Secondary | ICD-10-CM | POA: Diagnosis not present

## 2021-10-28 DIAGNOSIS — R293 Abnormal posture: Secondary | ICD-10-CM | POA: Insufficient documentation

## 2021-10-28 DIAGNOSIS — M542 Cervicalgia: Secondary | ICD-10-CM | POA: Insufficient documentation

## 2021-10-28 NOTE — Therapy (Signed)
[place] MAIN [facility] SERVICES [place], [place], [address] Phone: [phone]   Fax:  [phone]  Physical Therapy Treatment  Patient Details  Name: Rachel Fry MRN: [id number] Date of Birth: [birth year] Referring Provider (PT): Dr. Karlton Lemon   Encounter Date: 10/28/2021   PT End of Session - 10/28/21 0757     Visit Number 13    Number of Visits 16    Date for PT Re-Evaluation 12/02/21    Authorization Type 3/10 PN 11/15    PT Start Time 0715    PT Stop Time 0759    PT Time Calculation (min) 44 min    Activity Tolerance Patient tolerated treatment well    Behavior During Therapy [facility] for tasks assessed/performed             Past Medical History:  Diagnosis Date   Allergy    Asthma    COVID-19    History of kidney stones    Hypothyroidism    Migraines    Numbness and tingling    left side of body, occasionally right side    S/P Botox injection    Urticaria     Past Surgical History:  Procedure Laterality Date   ADENOIDECTOMY     ARM NEUROPLASTY Right 1994 and 1996   for chronic pain   CESAREAN SECTION     CYSTOSCOPY W/ RETROGRADES Left 04/23/2020   Procedure: CYSTOSCOPY WITH RETROGRADE PYELOGRAM;  Surgeon: Abbie Sons, MD;  Location: [facility];  Service: Urology;  Laterality: Left;   CYSTOSCOPY W/ RETROGRADES Left 05/13/2020   Procedure: CYSTOSCOPY WITH RETROGRADE PYELOGRAM;  Surgeon: Hollice Espy, MD;  Location: [facility];  Service: Urology;  Laterality: Left;   CYSTOSCOPY WITH STENT PLACEMENT Left 04/23/2020   Procedure: CYSTOSCOPY WITH STENT PLACEMENT;  Surgeon: Abbie Sons, MD;  Location: [facility];  Service: Urology;  Laterality: Left;   CYSTOSCOPY/URETEROSCOPY/HOLMIUM LASER/STENT PLACEMENT Left 05/13/2020   Procedure: CYSTOSCOPY/URETEROSCOPY/LASER/STENT EXCHANGE;  Surgeon: Hollice Espy, MD;  Location: [facility];  Service: Urology;  Laterality: Left;   OVARIAN CYST REMOVAL  1973   STONE  EXTRACTION WITH BASKET  05/13/2020   Procedure: STONE EXTRACTION WITH BASKET;  Surgeon: Hollice Espy, MD;  Location: [facility];  Service: Urology;;   TONSILLECTOMY     TONSILLECTOMY AND ADENOIDECTOMY     TUBAL LIGATION  1988    There were no vitals filed for this visit.   Subjective Assessment - 10/28/21 0716     Subjective Patient reports she hasn't celebrated the holidays yet.    Pertinent History Patient is a 66 year old female who presents to physical therapy for migraines with specific referral for dry needling.  Patient has been having migraines for about 14-15 years. They have changed from front temporal all day long 2-3/10, a couple years ago started having tingling in left side of body, numbness in arm and leg. Additionally sometimes get's belly aches.  Changed medication in 2020. Was getting trigger injections but it was too expensive. PMH of HLD, aortic atherosclerosis, asthma, COVID 19, SOB, hypothyroidism, atypical facial pain (2019), neuropathy (2020), fibromyalgia    Limitations Reading;House hold activities;Other (comment)    How long can you sit comfortably? n/a    How long can you stand comfortably? n/a    How long can you walk comfortably? n/a    Diagnostic tests negative MRI for cervical, thoracic, brain    Patient Stated Goals to help manage symptoms    Currently in  Pain? Yes    Pain Score 4     Pain Location Head    Pain Orientation Lateral    Pain Descriptors / Indicators Aching    Pain Type Neuropathic pain    Pain Onset More than a month ago    Pain Frequency Intermittent                     Treatment Cervical side bend with overpressure to glenohumeral joint 5x 30 seconds Cervical rotation with overpressure to glenohumeral joint: 5x 30 seconds Suboccipital release 4x30 seconds J mobilization for postural kyphosis reduction 4x 30 seconds cervical and thoracic mobilizations grade II for reduced hypomobility x 6 minutes Prone W's 10x Seated  large swiss ball forward trunk roll 10x 10 second holds Seated upper trap stretch 30 seconds each trap Seated trunk extensions 10x over towel roll.      Trigger Point Dry Needling (TDN), unbilled Education performed with patient regarding potential benefit of TDN. Reviewed precautions and risks with patient. Reviewed special precautions/risks over lung fields which include pneumothorax. Reviewed signs and symptoms of pneumothorax and advised pt to go to ER immediately if these symptoms develop advise them of dry needling treatment. Extensive time spent with pt to ensure full understanding of TDN risks. Pt provided verbal consent to treatment. TDN performed to  with 0.30 x 30 and .2 x 0.3 single needle placements with local twitch response (LTR). Pistoning technique utilized. Improved pain-free motion following intervention. L and Rcervical paraspinals, Upper trap (L and R), temporalis, suboccipital musculature  x 16 minutes         Pt educated throughout session about proper posture and technique with exercises. Improved exercise technique, movement at target joints, use of target muscles after min to mod verbal, visual, tactile cues.    Patient has multiple trigger points throughout R upper trap this session that are released with TDN. Postural correction and stretching interventions tolerated well. Patient educated on postural correlation with migraines with patient verbalizing understanding. She will benefit from skilled physical therapy to reduce pain, improve patient independence with symptom management, and improve quality of life                   PT Education - 10/28/21 0748     Education provided Yes    Education Details TDN, posture    Person(s) Educated Patient    Methods Explanation;Demonstration;Tactile cues;Verbal cues    Comprehension Verbalized understanding;Returned demonstration;Verbal cues required;Tactile cues required              PT Short Term Goals  - 09/09/21 0858       PT SHORT TERM GOAL #1   Title Pt will be independent with HEP in order to decrease pain in order to improve pain-free function at home and work.    Baseline 6/7: HEP given 9/1: HEp compliant 11/15: HEP compliant    Time 6    Period Weeks    Status Achieved    Target Date 08/07/21               PT Long Term Goals - 09/09/21 0858       PT LONG TERM GOAL #1   Title Patient will increase FOTO score to equal to or greater than 59%    to demonstrate statistically significant improvement in mobility and quality of life.    Baseline 6/7: 50% 9/1: 59% 11/15: 86%    Time 12    Period Weeks  Status Achieved      PT LONG TERM GOAL #2   Title Pt will decrease worst pain as reported on NPRS by at least 3 points (3/10) in order to demonstrate clinically significant reduction in pain.    Baseline 6/7: 6/10 9/1: 4/10 11/15: 5/10    Time 12    Period Weeks    Status Partially Met    Target Date 12/02/21      PT LONG TERM GOAL #3   Title Pt will demonstrate pain-free full range cervical motion in order to perform IADLs such as driving and household chores without increase in symptoms.    Baseline 6/7: see note for limitation 9/1:flexion 54, extension 60, SB R 35 L 30, rotation R 51 L 62 11/15: flexion 58, extension 60, SB R and L 35, R rotation 59 L rotation 60    Time 12    Period Weeks    Status Partially Met    Target Date 12/02/21      PT LONG TERM GOAL #4   Title Patient will have less than two days a week of high pain migraines and demonstrate ability to self control/contain migraines.    Baseline 9/1:every day getting the mild ones, 2x/week for high pain migraines. 11/15: two days of high pain in past month, has mini headaches every day    Time 12    Period Weeks    Status Partially Met    Target Date 12/02/21                   Plan - 10/28/21 0759     Clinical Impression Statement Patient has multiple trigger points throughout R upper trap  this session that are released with TDN. Postural correction and stretching interventions tolerated well. Patient educated on postural correlation with migraines with patient verbalizing understanding. She will benefit from skilled physical therapy to reduce pain, improve patient independence with symptom management, and improve quality of life    Personal Factors and Comorbidities Age;Behavior Pattern;Comorbidity 3+;Fitness;Past/Current Experience;Profession;Time since onset of injury/illness/exacerbation    Comorbidities HLD, aortic atherosclerosis, asthma, COVID 19, SOB, hypothyroidism, atypical facial pain (2019), neuropathy (2020), fibromyalgia    Examination-Activity Limitations Caring for Others;Carry;Lift;Sleep;Transfers    Examination-Participation Restrictions Cleaning;Community Activity;Interpersonal Relationship;Driving;Laundry;Shop;Occupation;Meal Prep;Yard Work    [employer] officer Evolving/Moderate complexity    Rehab Potential Fair    PT Frequency Biweekly    PT Duration 12 weeks    PT Treatment/Interventions ADLs/Self Care Home Management;Dry needling;Manual techniques;Therapeutic exercise;Therapeutic activities;Neuromuscular re-education;Aquatic Therapy;Biofeedback;Canalith Repostioning;Cryotherapy;Electrical Stimulation;Iontophoresis 18m/ml Dexamethasone;Moist Heat;Ultrasound;Patient/family education;Passive range of motion;Energy conservation;Vestibular;Joint Manipulations;Traction;Gait training;Balance training;Taping;Spinal Manipulations;Visual/perceptual remediation/compensation    PT Next Visit Plan Perform trigger point dry needling and manual techniques until pt can tolerate active strengthening, additional chronic pain education    PT Home Exercise Plan see above    Consulted and Agree with Plan of Care Patient             Patient will benefit from skilled therapeutic intervention in order to improve the following deficits and impairments:  Impaired  perceived functional ability, Impaired UE functional use, Pain, Decreased mobility, Decreased range of motion, Decreased strength, Hypomobility, Impaired flexibility, Increased muscle spasms, Postural dysfunction, Improper body mechanics  Visit Diagnosis: Abnormal posture  Nonintractable headache, unspecified chronicity pattern, unspecified headache type  Cervicalgia     Problem List Patient Active Problem List   Diagnosis Date Noted   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 hauler 12/25/2020   Chronic urticaria 12/25/2020   Moderate persistent asthma, uncomplicated 016/96/7893  Ageusia 08/12/2020   Anosmia 08/12/2020   Myalgia, epidemic 08/12/2020   Cough with exposure to COVID-19 virus 08/12/2020   Olfactory impairment 05/29/2020   Acute UTI 04/23/2020   Left ureteral stone 04/23/2020   Intermittent chest pain 02/09/2020   History of COVID-19 02/09/2020   Shortness of breath 02/09/2020   Loss of taste 02/09/2020   Loss of smell 02/09/2020   Rash 02/09/2020   Arthralgia 02/09/2020   Postoperative history of checked last year 04/22/2017   Weakness of left arm 04/22/2017   Numbness and tingling in left arm 04/22/2017   Lower back pain 04/02/2016   Acute stress disorder 02/25/2015   Anxiety 02/25/2015   Airway hyperreactivity 02/25/2015   Bradycardia 02/25/2015   Hypersomnia 02/25/2015   Clinical depression 02/25/2015   Elevated blood sugar 02/25/2015   Fibrositis 02/25/2015   Acid reflux 02/25/2015   Hepatitis non A non B 02/25/2015   H/O disease 02/25/2015   Hypercholesteremia 02/25/2015   Headache, migraine 02/25/2015   Muscle ache 02/25/2015   Allergic rhinitis, seasonal 02/25/2015   Avitaminosis D 02/25/2015    Janna Arch, PT, DPT  10/28/2021, 8:00 AM  [place] [facility] MAIN [facility] SERVICES [address] [place], [place], [address] Phone: [phone]   Fax:  [phone]  Name: ANNTONETTE MADEWELL MRN: [id number] Date of Birth: 12-Feb-1956

## 2021-10-31 ENCOUNTER — Other Ambulatory Visit (HOSPITAL_COMMUNITY): Payer: Self-pay

## 2021-11-06 ENCOUNTER — Other Ambulatory Visit (HOSPITAL_COMMUNITY): Payer: Self-pay

## 2021-11-11 ENCOUNTER — Ambulatory Visit (INDEPENDENT_AMBULATORY_CARE_PROVIDER_SITE_OTHER): Payer: 59

## 2021-11-11 ENCOUNTER — Ambulatory Visit: Payer: 59

## 2021-11-11 ENCOUNTER — Other Ambulatory Visit: Payer: Self-pay

## 2021-11-11 DIAGNOSIS — R519 Headache, unspecified: Secondary | ICD-10-CM | POA: Diagnosis not present

## 2021-11-11 DIAGNOSIS — L501 Idiopathic urticaria: Secondary | ICD-10-CM

## 2021-11-11 DIAGNOSIS — M542 Cervicalgia: Secondary | ICD-10-CM

## 2021-11-11 DIAGNOSIS — R293 Abnormal posture: Secondary | ICD-10-CM | POA: Diagnosis not present

## 2021-11-11 NOTE — Therapy (Signed)
[place] MAIN [facility] SERVICES [place], [place], [address] Phone: [phone]   Fax:  [phone]  Physical Therapy Treatment  Patient Details  Name: Rachel Fry MRN: [id number] Date of Birth: 05-Mar-1956 Referring Provider (PT): Dr. Karlton Lemon   Encounter Date: 11/11/2021   PT End of Session - 11/11/21 1335     Visit Number 14    Number of Visits 16    Date for PT Re-Evaluation 12/02/21    Authorization Type 4/10 PN 11/15    PT Start Time 0715    PT Stop Time 0759    PT Time Calculation (min) 44 min    Activity Tolerance Patient tolerated treatment well    Behavior During Therapy [facility] for tasks assessed/performed             Past Medical History:  Diagnosis Date   Allergy    Asthma    COVID-19    History of kidney stones    Hypothyroidism    Migraines    Numbness and tingling    left side of body, occasionally right side    S/P Botox injection    Urticaria     Past Surgical History:  Procedure Laterality Date   ADENOIDECTOMY     ARM NEUROPLASTY Right 1994 and 1996   for chronic pain   CESAREAN SECTION     CYSTOSCOPY W/ RETROGRADES Left 04/23/2020   Procedure: CYSTOSCOPY WITH RETROGRADE PYELOGRAM;  Surgeon: Abbie Sons, MD;  Location: [facility];  Service: Urology;  Laterality: Left;   CYSTOSCOPY W/ RETROGRADES Left 05/13/2020   Procedure: CYSTOSCOPY WITH RETROGRADE PYELOGRAM;  Surgeon: Hollice Espy, MD;  Location: [facility];  Service: Urology;  Laterality: Left;   CYSTOSCOPY WITH STENT PLACEMENT Left 04/23/2020   Procedure: CYSTOSCOPY WITH STENT PLACEMENT;  Surgeon: Abbie Sons, MD;  Location: [facility];  Service: Urology;  Laterality: Left;   CYSTOSCOPY/URETEROSCOPY/HOLMIUM LASER/STENT PLACEMENT Left 05/13/2020   Procedure: CYSTOSCOPY/URETEROSCOPY/LASER/STENT EXCHANGE;  Surgeon: Hollice Espy, MD;  Location: [facility];  Service: Urology;  Laterality: Left;   OVARIAN CYST REMOVAL  1973   STONE  EXTRACTION WITH BASKET  05/13/2020   Procedure: STONE EXTRACTION WITH BASKET;  Surgeon: Hollice Espy, MD;  Location: [facility];  Service: Urology;;   TONSILLECTOMY     TONSILLECTOMY AND ADENOIDECTOMY     TUBAL LIGATION  1988    There were no vitals filed for this visit.   Subjective Assessment - 11/11/21 1334     Subjective Patient reports her headaches have been mild-moderate. Has been running into walls more the past few days.    Pertinent History Patient is a 66 year old female who presents to physical therapy for migraines with specific referral for dry needling.  Patient has been having migraines for about 14-15 years. They have changed from front temporal all day long 2-3/10, a couple years ago started having tingling in left side of body, numbness in arm and leg. Additionally sometimes get's belly aches.  Changed medication in 2020. Was getting trigger injections but it was too expensive. PMH of HLD, aortic atherosclerosis, asthma, COVID 19, SOB, hypothyroidism, atypical facial pain (2019), neuropathy (2020), fibromyalgia    Limitations Reading;House hold activities;Other (comment)    How long can you sit comfortably? n/a    How long can you stand comfortably? n/a    How long can you walk comfortably? n/a    Diagnostic tests negative MRI for cervical, thoracic, brain    Patient Stated Goals  to help manage symptoms    Currently in Pain? Yes    Pain Score 4     Pain Location Head    Pain Orientation Lateral    Pain Descriptors / Indicators Aching    Pain Type Neuropathic pain    Pain Onset More than a month ago    Pain Frequency Intermittent               Treatment Cervical side bend with overpressure to glenohumeral joint 5x 30 seconds Cervical rotation with overpressure to glenohumeral joint: 5x 30 seconds Suboccipital release 4x30 seconds J mobilization for postural kyphosis reduction 4x 30 seconds cervical and thoracic mobilizations grade II for reduced hypomobility  x 6 minutes Seated large swiss ball forward trunk roll 10x 10 second holds      Trigger Point Dry Needling (TDN), unbilled Education performed with patient regarding potential benefit of TDN. Reviewed precautions and risks with patient. Reviewed special precautions/risks over lung fields which include pneumothorax. Reviewed signs and symptoms of pneumothorax and advised pt to go to ER immediately if these symptoms develop advise them of dry needling treatment. Extensive time spent with pt to ensure full understanding of TDN risks. Pt provided verbal consent to treatment. TDN performed to  with 0.30 x 30 and .2 x 0.3 single needle placements with local twitch response (LTR). Pistoning technique utilized. Improved pain-free motion following intervention. L and Rcervical paraspinals, Upper trap (L and R), temporalis, suboccipital musculature  x 23 minutes         Pt educated throughout session about proper posture and technique with exercises. Improved exercise technique, movement at target joints, use of target muscles after min to mod verbal, visual, tactile cues.    Patient has multiple trigger points in L upper trap this session that improved by end of session. Patient reports decreased headache and improved tension reduction by end of session. Her hypomobility in thoracic spine is additionally reduced. She will benefit from skilled physical therapy to reduce pain, improve patient independence with symptom management, and improve quality of life                       PT Education - 11/11/21 1335     Education provided Yes    Education Details exercise technique, body mechanics    Person(s) Educated Patient    Methods Explanation;Demonstration;Tactile cues;Verbal cues    Comprehension Verbalized understanding;Returned demonstration;Verbal cues required;Tactile cues required              PT Short Term Goals - 09/09/21 0858       PT SHORT TERM GOAL #1   Title Pt  will be independent with HEP in order to decrease pain in order to improve pain-free function at home and work.    Baseline 6/7: HEP given 9/1: HEp compliant 11/15: HEP compliant    Time 6    Period Weeks    Status Achieved    Target Date 08/07/21               PT Long Term Goals - 09/09/21 0858       PT LONG TERM GOAL #1   Title Patient will increase FOTO score to equal to or greater than 59%    to demonstrate statistically significant improvement in mobility and quality of life.    Baseline 6/7: 50% 9/1: 59% 11/15: 86%    Time 12    Period Weeks    Status Achieved  PT LONG TERM GOAL #2   Title Pt will decrease worst pain as reported on NPRS by at least 3 points (3/10) in order to demonstrate clinically significant reduction in pain.    Baseline 6/7: 6/10 9/1: 4/10 11/15: 5/10    Time 12    Period Weeks    Status Partially Met    Target Date 12/02/21      PT LONG TERM GOAL #3   Title Pt will demonstrate pain-free full range cervical motion in order to perform IADLs such as driving and household chores without increase in symptoms.    Baseline 6/7: see note for limitation 9/1:flexion 54, extension 60, SB R 35 L 30, rotation R 51 L 62 11/15: flexion 58, extension 60, SB R and L 35, R rotation 59 L rotation 60    Time 12    Period Weeks    Status Partially Met    Target Date 12/02/21      PT LONG TERM GOAL #4   Title Patient will have less than two days a week of high pain migraines and demonstrate ability to self control/contain migraines.    Baseline 9/1:every day getting the mild ones, 2x/week for high pain migraines. 11/15: two days of high pain in past month, has mini headaches every day    Time 12    Period Weeks    Status Partially Met    Target Date 12/02/21                   Plan - 11/11/21 1338     Clinical Impression Statement Patient has multiple trigger points in L upper trap this session that improved by end of session. Patient reports  decreased headache and improved tension reduction by end of session. Her hypomobility in thoracic spine is additionally reduced. She will benefit from skilled physical therapy to reduce pain, improve patient independence with symptom management, and improve quality of life    Personal Factors and Comorbidities Age;Behavior Pattern;Comorbidity 3+;Fitness;Past/Current Experience;Profession;Time since onset of injury/illness/exacerbation    Comorbidities HLD, aortic atherosclerosis, asthma, COVID 19, SOB, hypothyroidism, atypical facial pain (2019), neuropathy (2020), fibromyalgia    Examination-Activity Limitations Caring for Others;Carry;Lift;Sleep;Transfers    Examination-Participation Restrictions Cleaning;Community Activity;Interpersonal Relationship;Driving;Laundry;Shop;Occupation;Meal Prep;Yard Work    [employer] officer Evolving/Moderate complexity    Rehab Potential Fair    PT Frequency Biweekly    PT Duration 12 weeks    PT Treatment/Interventions ADLs/Self Care Home Management;Dry needling;Manual techniques;Therapeutic exercise;Therapeutic activities;Neuromuscular re-education;Aquatic Therapy;Biofeedback;Canalith Repostioning;Cryotherapy;Electrical Stimulation;Iontophoresis 63m/ml Dexamethasone;Moist Heat;Ultrasound;Patient/family education;Passive range of motion;Energy conservation;Vestibular;Joint Manipulations;Traction;Gait training;Balance training;Taping;Spinal Manipulations;Visual/perceptual remediation/compensation    PT Next Visit Plan Perform trigger point dry needling and manual techniques until pt can tolerate active strengthening, additional chronic pain education    PT Home Exercise Plan see above    Consulted and Agree with Plan of Care Patient             Patient will benefit from skilled therapeutic intervention in order to improve the following deficits and impairments:  Impaired perceived functional ability, Impaired UE functional use, Pain, Decreased  mobility, Decreased range of motion, Decreased strength, Hypomobility, Impaired flexibility, Increased muscle spasms, Postural dysfunction, Improper body mechanics  Visit Diagnosis: Abnormal posture  Nonintractable headache, unspecified chronicity pattern, unspecified headache type  Cervicalgia     Problem List Patient Active Problem List   Diagnosis Date Noted   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 hauler 12/25/2020   Chronic urticaria 12/25/2020   Moderate persistent asthma, uncomplicated 042/87/6811  Ageusia 08/12/2020  Anosmia 08/12/2020   Myalgia, epidemic 08/12/2020   Cough with exposure to COVID-19 virus 08/12/2020   Olfactory impairment 05/29/2020   Acute UTI 04/23/2020   Left ureteral stone 04/23/2020   Intermittent chest pain 02/09/2020   History of COVID-19 02/09/2020   Shortness of breath 02/09/2020   Loss of taste 02/09/2020   Loss of smell 02/09/2020   Rash 02/09/2020   Arthralgia 02/09/2020   Postoperative history of checked last year 04/22/2017   Weakness of left arm 04/22/2017   Numbness and tingling in left arm 04/22/2017   Lower back pain 04/02/2016   Acute stress disorder 02/25/2015   Anxiety 02/25/2015   Airway hyperreactivity 02/25/2015   Bradycardia 02/25/2015   Hypersomnia 02/25/2015   Clinical depression 02/25/2015   Elevated blood sugar 02/25/2015   Fibrositis 02/25/2015   Acid reflux 02/25/2015   Hepatitis non A non B 02/25/2015   H/O disease 02/25/2015   Hypercholesteremia 02/25/2015   Headache, migraine 02/25/2015   Muscle ache 02/25/2015   Allergic rhinitis, seasonal 02/25/2015   Avitaminosis D 02/25/2015    Janna Arch, PT, DPT  11/11/2021, 1:39 PM   [facility] MAIN [facility] SERVICES [place], [place], [address] Phone: [phone]   Fax:  [phone]  Name: Rachel Fry MRN: [id number] Date of Birth: 07/27/1956

## 2021-11-18 DIAGNOSIS — G43719 Chronic migraine without aura, intractable, without status migrainosus: Secondary | ICD-10-CM | POA: Diagnosis not present

## 2021-11-25 ENCOUNTER — Ambulatory Visit: Payer: 59

## 2021-11-25 ENCOUNTER — Other Ambulatory Visit (HOSPITAL_COMMUNITY): Payer: Self-pay

## 2021-11-26 ENCOUNTER — Other Ambulatory Visit (HOSPITAL_COMMUNITY): Payer: Self-pay

## 2021-12-01 ENCOUNTER — Other Ambulatory Visit (HOSPITAL_COMMUNITY): Payer: Self-pay

## 2021-12-04 ENCOUNTER — Other Ambulatory Visit (HOSPITAL_COMMUNITY): Payer: Self-pay

## 2021-12-09 ENCOUNTER — Ambulatory Visit: Payer: 59 | Attending: Internal Medicine

## 2021-12-09 ENCOUNTER — Ambulatory Visit (INDEPENDENT_AMBULATORY_CARE_PROVIDER_SITE_OTHER): Payer: 59 | Admitting: *Deleted

## 2021-12-09 ENCOUNTER — Other Ambulatory Visit: Payer: Self-pay

## 2021-12-09 DIAGNOSIS — R519 Headache, unspecified: Secondary | ICD-10-CM | POA: Diagnosis not present

## 2021-12-09 DIAGNOSIS — L501 Idiopathic urticaria: Secondary | ICD-10-CM

## 2021-12-09 DIAGNOSIS — R293 Abnormal posture: Secondary | ICD-10-CM | POA: Diagnosis not present

## 2021-12-09 DIAGNOSIS — M542 Cervicalgia: Secondary | ICD-10-CM | POA: Diagnosis not present

## 2021-12-09 NOTE — Therapy (Signed)
[place] MAIN [facility] SERVICES [place], [place], [address] Phone: [phone]   Fax:  [phone]  Physical Therapy Treatment/RECERT  Patient Details  Name: Rachel Fry MRN: [id number] Date of Birth: 1956/06/16 Referring Provider (PT): Dr. Karlton Lemon   Encounter Date: 12/09/2021   PT End of Session - 12/09/21 0725     Visit Number 15    Number of Visits 21    Date for PT Re-Evaluation 03/03/22    Authorization Type 5/10 PN 11/15    PT Start Time 0715    PT Stop Time 0800    PT Time Calculation (min) 45 min    Activity Tolerance Patient tolerated treatment well    Behavior During Therapy [facility] for tasks assessed/performed             Past Medical History:  Diagnosis Date   Allergy    Asthma    COVID-19    History of kidney stones    Hypothyroidism    Migraines    Numbness and tingling    left side of body, occasionally right side    S/P Botox injection    Urticaria     Past Surgical History:  Procedure Laterality Date   ADENOIDECTOMY     ARM NEUROPLASTY Right 1994 and 1996   for chronic pain   CESAREAN SECTION     CYSTOSCOPY W/ RETROGRADES Left 04/23/2020   Procedure: CYSTOSCOPY WITH RETROGRADE PYELOGRAM;  Surgeon: Abbie Sons, MD;  Location: [facility] OR[facility];  Service: Urology;  Laterality: Left;   CYSTOSCOPY W/ RETROGRADES Left 05/13/2020   Procedure: CYSTOSCOPY WITH RETROGRADE PYELOGRAM;  Surgeon: Hollice Espy, MD;  Location: [facility];  Service: Urology;  Laterality: Left;   CYSTOSCOPY WITH STENT PLACEMENT Left 04/23/2020   Procedure: CYSTOSCOPY WITH STENT PLACEMENT;  Surgeon: Abbie Sons, MD;  Location: [facility];  Service: Urology;  Laterality: Left;   CYSTOSCOPY/URETEROSCOPY/HOLMIUM LASER/STENT PLACEMENT Left 05/13/2020   Procedure: CYSTOSCOPY/URETEROSCOPY/LASER/STENT EXCHANGE;  Surgeon: Hollice Espy, MD;  Location: [facility];  Service: Urology;  Laterality: Left;   OVARIAN CYST REMOVAL  1973   STONE  EXTRACTION WITH BASKET  05/13/2020   Procedure: STONE EXTRACTION WITH BASKET;  Surgeon: Hollice Espy, MD;  Location: [facility];  Service: Urology;;   TONSILLECTOMY     TONSILLECTOMY AND ADENOIDECTOMY     TUBAL LIGATION  1988    There were no vitals filed for this visit.   Subjective Assessment - 12/09/21 0723     Subjective Patient has been having a flare up with neck pain   Has not been seen in a month due to PT absence from illness.    Pertinent History Patient is a 66 year old female who presents to physical therapy for migraines with specific referral for dry needling.  Patient has been having migraines for about 14-15 years. They have changed from front temporal all day long 2-3/10, a couple years ago started having tingling in left side of body, numbness in arm and leg. Additionally sometimes get's belly aches.  Changed medication in 2020. Was getting trigger injections but it was too expensive. PMH of HLD, aortic atherosclerosis, asthma, COVID 19, SOB, hypothyroidism, atypical facial pain (2019), neuropathy (2020), fibromyalgia    Limitations Reading;House hold activities;Other (comment)    How long can you sit comfortably? n/a    How long can you stand comfortably? n/a    How long can you walk comfortably? n/a    Diagnostic tests negative MRI for cervical,  thoracic, brain    Patient Stated Goals to help manage symptoms    Currently in Pain? Yes    Pain Score 2     Pain Location Head    Pain Orientation Right;Left    Pain Descriptors / Indicators Aching;Pounding;Headache    Pain Type Neuropathic pain    Pain Onset More than a month ago             Patient has been having a flare up with neck pain     RECERT FOTO: 59%  VAS: 3/10 headache  Cervical ROM    Right Left  Flexion 70  Extension 60  Side Bending 38 38  Rotation 59 64    Have less than two days of high pain a week: has worsening migraines every day but has 2-3 days of worse pain.    New goal:  NDI  Treatment Cervical side bend with overpressure to glenohumeral joint 5x 30 seconds Cervical rotation with overpressure to glenohumeral joint: 5x 30 seconds Suboccipital release 4x30 seconds J mobilization for postural kyphosis reduction 4x 30 seconds cervical and thoracic mobilizations grade II for reduced hypomobility x 6 minutes Median nerve glide RUE 8x  Ulnar nerve glide seated prayer position 10x (added to HEP) Supine overhead RTB Y 10        Trigger Point Dry Needling (TDN), unbilled Education performed with patient regarding potential benefit of TDN. Reviewed precautions and risks with patient. Reviewed special precautions/risks over lung fields which include pneumothorax. Reviewed signs and symptoms of pneumothorax and advised pt to go to ER immediately if these symptoms develop advise them of dry needling treatment. Extensive time spent with pt to ensure full understanding of TDN risks. Pt provided verbal consent to treatment. TDN performed to  with 0.30 x 30 and .2 x 0.3 single needle placements with local twitch response (LTR). Pistoning technique utilized. Improved pain-free motion following intervention. L and Rcervical paraspinals, Upper trap (L and R), temporalis, suboccipital musculature  x 63mnutes         Pt educated throughout session about proper posture and technique with exercises. Improved exercise technique, movement at target joints, use of target muscles after min to mod verbal, visual, tactile cues.    Patient is demonstrating progression of functional goals with decreased pain, decreased pain frequency, and improved cervical ROM. Patient does continue to have minor migraines daily but the intensity is decreasing indicating positive effects of physical therapy. Nerve glides are required today due to radiating symptoms of RUE. Additional nerve glide added to HEP, additional sessions will benefit from strengthening of postural musculature. She will benefit from  skilled physical therapy to reduce pain, improve patient independence with symptom management, and improve quality of life                   PT Education - 12/09/21 0724     Education provided Yes    Education Details recert, TDN    Person(s) Educated Patient    Methods Explanation;Demonstration;Tactile cues;Verbal cues    Comprehension Verbalized understanding;Returned demonstration;Verbal cues required;Tactile cues required              PT Short Term Goals - 09/09/21 0858       PT SHORT TERM GOAL #1   Title Pt will be independent with HEP in order to decrease pain in order to improve pain-free function at home and work.    Baseline 6/7: HEP given 9/1: HEp compliant 11/15: HEP compliant    Time  6    Period Weeks    Status Achieved    Target Date 08/07/21               PT Long Term Goals - 12/09/21 0808       PT LONG TERM GOAL #1   Title Patient will increase FOTO score to equal to or greater than 59%    to demonstrate statistically significant improvement in mobility and quality of life.    Baseline 6/7: 50% 9/1: 59% 11/15: 86% 2/14: 71%    Time 12    Period Weeks    Status Achieved      PT LONG TERM GOAL #2   Title Pt will decrease worst pain as reported on NPRS by at least 3 points (3/10) in order to demonstrate clinically significant reduction in pain.    Baseline 6/7: 6/10 9/1: 4/10 11/15: 5/10 2/14: 3-4/10    Time 12    Period Weeks    Status Partially Met    Target Date 03/03/22      PT LONG TERM GOAL #3   Title Pt will demonstrate pain-free full range cervical motion in order to perform IADLs such as driving and household chores without increase in symptoms.    Baseline 6/7: see note for limitation 9/1:flexion 54, extension 60, SB R 35 L 30, rotation R 51 L 62 11/15: flexion 58, extension 60, SB R and L 35, R rotation 59 L rotation 60 2/14: flexion 70, extension 60 SB R and L 38, R rotation 59 L 64    Time 12    Period Weeks    Status  Partially Met    Target Date 03/03/22      PT LONG TERM GOAL #4   Title Patient will have less than two days a week of high pain migraines and demonstrate ability to self control/contain migraines.    Baseline 9/1:every day getting the mild ones, 2x/week for high pain migraines. 11/15: two days of high pain in past month, has mini headaches every day 2/14: has worsening headaches in the evening but only 2-3 days of high pain    Time 12    Period Weeks    Status Partially Met    Target Date 03/03/22      PT LONG TERM GOAL #5   Title Patient will reduce Neck Disability Index score to <10% to demonstrate minimal disability with ADLs including improved sleeping tolerance, sitting tolerance, etc for better mobility at home and work.    Baseline 2/14: 18%    Time 12    Period Weeks    Status New    Target Date 03/03/22                   Plan - 12/09/21 0814     Clinical Impression Statement Patient is demonstrating progression of functional goals with decreased pain, decreased pain frequency, and improved cervical ROM. Patient does continue to have minor migraines daily but the intensity is decreasing indicating positive effects of physical therapy. Nerve glides are required today due to radiating symptoms of RUE. Additional nerve glide added to HEP, additional sessions will benefit from strengthening of postural musculature. She will benefit from skilled physical therapy to reduce pain, improve patient independence with symptom management, and improve quality of life    Personal Factors and Comorbidities Age;Behavior Pattern;Comorbidity 3+;Fitness;Past/Current Experience;Profession;Time since onset of injury/illness/exacerbation    Comorbidities HLD, aortic atherosclerosis, asthma, COVID 19, SOB, hypothyroidism, atypical facial pain (2019), neuropathy (  2020), fibromyalgia    Examination-Activity Limitations Caring for Others;Carry;Lift;Sleep;Transfers    Examination-Participation  Restrictions Cleaning;Community Activity;Interpersonal Relationship;Driving;Laundry;Shop;Occupation;Meal Prep;Yard Work    [employer] Evolving/Moderate complexity    Rehab Potential Fair    PT Frequency Biweekly    PT Duration 12 weeks    PT Treatment/Interventions ADLs/Self Care Home Management;Dry needling;Manual techniques;Therapeutic exercise;Therapeutic activities;Neuromuscular re-education;Aquatic Therapy;Biofeedback;Canalith Repostioning;Cryotherapy;Electrical Stimulation;Iontophoresis 46m/ml Dexamethasone;Moist Heat;Ultrasound;Patient/family education;Passive range of motion;Energy conservation;Vestibular;Joint Manipulations;Traction;Gait training;Balance training;Taping;Spinal Manipulations;Visual/perceptual remediation/compensation    PT Next Visit Plan Perform trigger point dry needling and manual techniques until pt can tolerate active strengthening, additional chronic pain education    PT Home Exercise Plan see above    Consulted and Agree with Plan of Care Patient             Patient will benefit from skilled therapeutic intervention in order to improve the following deficits and impairments:  Impaired perceived functional ability, Impaired UE functional use, Pain, Decreased mobility, Decreased range of motion, Decreased strength, Hypomobility, Impaired flexibility, Increased muscle spasms, Postural dysfunction, Improper body mechanics  Visit Diagnosis: Abnormal posture  Nonintractable headache, unspecified chronicity pattern, unspecified headache type  Cervicalgia     Problem List Patient Active Problem List   Diagnosis Date Noted   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 hauler 12/25/2020   Chronic urticaria 12/25/2020   Moderate persistent asthma, uncomplicated 093/24/1991  Ageusia 08/12/2020   Anosmia 08/12/2020   Myalgia, epidemic 08/12/2020   Cough with exposure to COVID-19 virus 08/12/2020   Olfactory impairment 05/29/2020   Acute UTI 04/23/2020   Left  ureteral stone 04/23/2020   Intermittent chest pain 02/09/2020   History of COVID-19 02/09/2020   Shortness of breath 02/09/2020   Loss of taste 02/09/2020   Loss of smell 02/09/2020   Rash 02/09/2020   Arthralgia 02/09/2020   Postoperative history of checked last year 04/22/2017   Weakness of left arm 04/22/2017   Numbness and tingling in left arm 04/22/2017   Lower back pain 04/02/2016   Acute stress disorder 02/25/2015   Anxiety 02/25/2015   Airway hyperreactivity 02/25/2015   Bradycardia 02/25/2015   Hypersomnia 02/25/2015   Clinical depression 02/25/2015   Elevated blood sugar 02/25/2015   Fibrositis 02/25/2015   Acid reflux 02/25/2015   Hepatitis non A non B 02/25/2015   H/O disease 02/25/2015   Hypercholesteremia 02/25/2015   Headache, migraine 02/25/2015   Muscle ache 02/25/2015   Allergic rhinitis, seasonal 02/25/2015   Avitaminosis D 02/25/2015    MJanna Arch PT, DPT  12/09/2021, 8:17 AM  [place] CenterSERV[facility] [address] [place] [address]Phone: [phone]  Fax:  [phone] Name: SKREE ARMATOMRN: [id number] of Birth: 804-16-1957

## 2021-12-12 ENCOUNTER — Other Ambulatory Visit: Payer: Self-pay

## 2021-12-12 ENCOUNTER — Ambulatory Visit: Payer: 59

## 2021-12-13 MED FILL — Zafirlukast Tab 10 MG: ORAL | 90 days supply | Qty: 180 | Fill #2 | Status: AC

## 2021-12-15 ENCOUNTER — Other Ambulatory Visit (HOSPITAL_COMMUNITY): Payer: Self-pay

## 2021-12-16 ENCOUNTER — Other Ambulatory Visit (HOSPITAL_COMMUNITY): Payer: Self-pay

## 2021-12-17 ENCOUNTER — Other Ambulatory Visit (HOSPITAL_COMMUNITY): Payer: Self-pay

## 2021-12-17 MED ORDER — SPIRONOLACTONE 50 MG PO TABS
50.0000 mg | ORAL_TABLET | Freq: Two times a day (BID) | ORAL | 3 refills | Status: DC
  Filled 2021-12-17: qty 180, 90d supply, fill #0
  Filled 2022-03-08: qty 180, 90d supply, fill #1

## 2021-12-18 DIAGNOSIS — L821 Other seborrheic keratosis: Secondary | ICD-10-CM | POA: Diagnosis not present

## 2021-12-18 DIAGNOSIS — D2271 Melanocytic nevi of right lower limb, including hip: Secondary | ICD-10-CM | POA: Diagnosis not present

## 2021-12-18 DIAGNOSIS — D225 Melanocytic nevi of trunk: Secondary | ICD-10-CM | POA: Diagnosis not present

## 2021-12-18 DIAGNOSIS — D2262 Melanocytic nevi of left upper limb, including shoulder: Secondary | ICD-10-CM | POA: Diagnosis not present

## 2021-12-18 DIAGNOSIS — D2272 Melanocytic nevi of left lower limb, including hip: Secondary | ICD-10-CM | POA: Diagnosis not present

## 2021-12-18 DIAGNOSIS — D2261 Melanocytic nevi of right upper limb, including shoulder: Secondary | ICD-10-CM | POA: Diagnosis not present

## 2021-12-22 ENCOUNTER — Other Ambulatory Visit: Payer: Self-pay | Admitting: Internal Medicine

## 2021-12-22 ENCOUNTER — Other Ambulatory Visit (HOSPITAL_COMMUNITY): Payer: Self-pay

## 2021-12-22 DIAGNOSIS — I10 Essential (primary) hypertension: Secondary | ICD-10-CM | POA: Diagnosis not present

## 2021-12-22 DIAGNOSIS — R109 Unspecified abdominal pain: Secondary | ICD-10-CM

## 2021-12-22 DIAGNOSIS — R609 Edema, unspecified: Secondary | ICD-10-CM | POA: Diagnosis not present

## 2021-12-22 DIAGNOSIS — R5383 Other fatigue: Secondary | ICD-10-CM | POA: Diagnosis not present

## 2021-12-22 MED ORDER — TORSEMIDE 20 MG PO TABS
ORAL_TABLET | ORAL | 1 refills | Status: DC
  Filled 2021-12-22: qty 180, 90d supply, fill #0

## 2021-12-22 MED ORDER — SPIRONOLACTONE 50 MG PO TABS
ORAL_TABLET | ORAL | 3 refills | Status: DC
  Filled 2021-12-22 – 2022-06-02 (×2): qty 180, 90d supply, fill #0
  Filled 2022-08-23: qty 180, 90d supply, fill #1
  Filled 2022-10-27: qty 60, 30d supply, fill #2
  Filled 2022-10-27: qty 180, 90d supply, fill #2
  Filled 2022-11-23: qty 60, 30d supply, fill #3

## 2021-12-23 ENCOUNTER — Ambulatory Visit: Payer: 59

## 2021-12-23 ENCOUNTER — Other Ambulatory Visit (HOSPITAL_COMMUNITY): Payer: Self-pay

## 2021-12-23 ENCOUNTER — Other Ambulatory Visit: Payer: Self-pay

## 2021-12-23 DIAGNOSIS — R293 Abnormal posture: Secondary | ICD-10-CM | POA: Diagnosis not present

## 2021-12-23 DIAGNOSIS — R519 Headache, unspecified: Secondary | ICD-10-CM | POA: Diagnosis not present

## 2021-12-23 DIAGNOSIS — M542 Cervicalgia: Secondary | ICD-10-CM

## 2021-12-23 NOTE — Therapy (Signed)
[place] MAIN [facility] SERVICES [place], [place], [address] Phone: [phone]   Fax:  [phone]  Physical Therapy Treatment  Patient Details  Name: Rachel Fry MRN: [id number] Date of Birth: [birth year] Referring Provider (PT): Dr. Karlton Lemon   Encounter Date: 12/23/2021   PT End of Session - 12/23/21 0717     Visit Number 16    Number of Visits 21    Date for PT Re-Evaluation 03/03/22    Authorization Type 6/10 PN 11/15    PT Start Time 0715    PT Stop Time 0759    PT Time Calculation (min) 44 min    Activity Tolerance Patient tolerated treatment well    Behavior During Therapy [facility] for tasks assessed/performed             Past Medical History:  Diagnosis Date   Allergy    Asthma    COVID-19    History of kidney stones    Hypothyroidism    Migraines    Numbness and tingling    left side of body, occasionally right side    S/P Botox injection    Urticaria     Past Surgical History:  Procedure Laterality Date   ADENOIDECTOMY     ARM NEUROPLASTY Right 1994 and 1996   for chronic pain   CESAREAN SECTION     CYSTOSCOPY W/ RETROGRADES Left 04/23/2020   Procedure: CYSTOSCOPY WITH RETROGRADE PYELOGRAM;  Surgeon: Abbie Sons, MD;  Location: [facility];  Service: Urology;  Laterality: Left;   CYSTOSCOPY W/ RETROGRADES Left 05/13/2020   Procedure: CYSTOSCOPY WITH RETROGRADE PYELOGRAM;  Surgeon: Hollice Espy, MD;  Location: [facility];  Service: Urology;  Laterality: Left;   CYSTOSCOPY WITH STENT PLACEMENT Left 04/23/2020   Procedure: CYSTOSCOPY WITH STENT PLACEMENT;  Surgeon: Abbie Sons, MD;  Location: [facility];  Service: Urology;  Laterality: Left;   CYSTOSCOPY/URETEROSCOPY/HOLMIUM LASER/STENT PLACEMENT Left 05/13/2020   Procedure: CYSTOSCOPY/URETEROSCOPY/LASER/STENT EXCHANGE;  Surgeon: Hollice Espy, MD;  Location: [facility];  Service: Urology;  Laterality: Left;   OVARIAN CYST REMOVAL  1973   STONE  EXTRACTION WITH BASKET  05/13/2020   Procedure: STONE EXTRACTION WITH BASKET;  Surgeon: Hollice Espy, MD;  Location: [facility];  Service: Urology;;   TONSILLECTOMY     TONSILLECTOMY AND ADENOIDECTOMY     TUBAL LIGATION  1988    There were no vitals filed for this visit.   Subjective Assessment - 12/23/21 0716     Subjective Patient reports she has had a bad past five days requiring her to have to take her extra migraine medicine.    Pertinent History Patient is a 66 year old female who presents to physical therapy for migraines with specific referral for dry needling.  Patient has been having migraines for about 14-15 years. They have changed from front temporal all day long 2-3/10, a couple years ago started having tingling in left side of body, numbness in arm and leg. Additionally sometimes get's belly aches.  Changed medication in 2020. Was getting trigger injections but it was too expensive. PMH of HLD, aortic atherosclerosis, asthma, COVID 19, SOB, hypothyroidism, atypical facial pain (2019), neuropathy (2020), fibromyalgia    Limitations Reading;House hold activities;Other (comment)    How long can you sit comfortably? n/a    How long can you stand comfortably? n/a    How long can you walk comfortably? n/a    Diagnostic tests negative MRI for cervical, thoracic, brain  Patient Stated Goals to help manage symptoms    Currently in Pain? Yes    Pain Score 3     Pain Location Fry    Pain Orientation Mid    Pain Descriptors / Indicators Aching    Pain Type Chronic pain    Pain Radiating Towards neck    Pain Onset More than a month ago                     Treatment Cervical side bend with overpressure to glenohumeral joint 5x 30 seconds Cervical rotation with overpressure to glenohumeral joint: 5x 30 seconds Suboccipital release 4x30 seconds J mobilization for postural kyphosis reduction 4x 30 seconds cervical and thoracic mobilizations grade II for reduced  hypomobility x 6 minutes Seated trunk extension over half foam roller 10x        Trigger Point Dry Needling (TDN), unbilled Education performed with patient regarding potential benefit of TDN. Reviewed precautions and risks with patient. Reviewed special precautions/risks over lung fields which include pneumothorax. Reviewed signs and symptoms of pneumothorax and advised pt to go to ER immediately if these symptoms develop advise them of dry needling treatment. Extensive time spent with pt to ensure full understanding of TDN risks. Pt provided verbal consent to treatment. TDN performed to  with 0.30 x 30 and .2 x 0.3 single needle placements with local twitch response (LTR). Pistoning technique utilized. Improved pain-free motion following intervention. L and Rcervical paraspinals, Upper trap (L and R), temporalis, suboccipital musculature  x 24 minutes         Pt educated throughout session about proper posture and technique with exercises. Improved exercise technique, movement at target joints, use of target muscles after min to mod verbal, visual, tactile cues.   Patient presents with increased migraine intensity and frequency requiring increased focused manual for pain reduction. Patient reports soreness of L upper trap by end of session but increased muscle tissue length. Multiple cavitations with mobilizations noted with postural correction. She will benefit from skilled physical therapy to reduce pain, improve patient independence with symptom management, and improve quality of life                  PT Education - 12/23/21 0717     Education provided Yes    Education Details TDN, exercise technique    Person(s) Educated Patient    Methods Explanation;Demonstration;Tactile cues;Verbal cues    Comprehension Verbalized understanding;Returned demonstration;Verbal cues required;Tactile cues required              PT Short Term Goals - 09/09/21 0858       PT SHORT TERM  GOAL #1   Title Pt will be independent with HEP in order to decrease pain in order to improve pain-free function at home and work.    Baseline 6/7: HEP given 9/1: HEp compliant 11/15: HEP compliant    Time 6    Period Weeks    Status Achieved    Target Date 08/07/21               PT Long Term Goals - 12/09/21 0808       PT LONG TERM GOAL #1   Title Patient will increase FOTO score to equal to or greater than 59%    to demonstrate statistically significant improvement in mobility and quality of life.    Baseline 6/7: 50% 9/1: 59% 11/15: 86% 2/14: 71%    Time 12    Period Weeks  Status Achieved      PT LONG TERM GOAL #2   Title Pt will decrease worst pain as reported on NPRS by at least 3 points (3/10) in order to demonstrate clinically significant reduction in pain.    Baseline 6/7: 6/10 9/1: 4/10 11/15: 5/10 2/14: 3-4/10    Time 12    Period Weeks    Status Partially Met    Target Date 03/03/22      PT LONG TERM GOAL #3   Title Pt will demonstrate pain-free full range cervical motion in order to perform IADLs such as driving and household chores without increase in symptoms.    Baseline 6/7: see note for limitation 9/1:flexion 54, extension 60, SB R 35 L 30, rotation R 51 L 62 11/15: flexion 58, extension 60, SB R and L 35, R rotation 59 L rotation 60 2/14: flexion 70, extension 60 SB R and L 38, R rotation 59 L 64    Time 12    Period Weeks    Status Partially Met    Target Date 03/03/22      PT LONG TERM GOAL #4   Title Patient will have less than two days a week of high pain migraines and demonstrate ability to self control/contain migraines.    Baseline 9/1:every day getting the mild ones, 2x/week for high pain migraines. 11/15: two days of high pain in past month, has mini headaches every day 2/14: has worsening headaches in the evening but only 2-3 days of high pain    Time 12    Period Weeks    Status Partially Met    Target Date 03/03/22      PT LONG TERM  GOAL #5   Title Patient will reduce Neck Disability Index score to <10% to demonstrate minimal disability with ADLs including improved sleeping tolerance, sitting tolerance, etc for better mobility at home and work.    Baseline 2/14: 18%    Time 12    Period Weeks    Status New    Target Date 03/03/22                   Plan - 12/23/21 0806     Clinical Impression Statement Patient presents with increased migraine intensity and frequency requiring increased focused manual for pain reduction. Patient reports soreness of L upper trap by end of session but increased muscle tissue length. Multiple cavitations with mobilizations noted with postural correction. She will benefit from skilled physical therapy to reduce pain, improve patient independence with symptom management, and improve quality of life    Personal Factors and Comorbidities Age;Behavior Pattern;Comorbidity 3+;Fitness;Past/Current Experience;Profession;Time since onset of injury/illness/exacerbation    Comorbidities HLD, aortic atherosclerosis, asthma, COVID 19, SOB, hypothyroidism, atypical facial pain (2019), neuropathy (2020), fibromyalgia    Examination-Activity Limitations Caring for Others;Carry;Lift;Sleep;Transfers    Examination-Participation Restrictions Cleaning;Community Activity;Interpersonal Relationship;Driving;Laundry;Shop;Occupation;Meal Prep;Yard Work    [employer] Evolving/Moderate complexity    Rehab Potential Fair    PT Frequency Biweekly    PT Duration 12 weeks    PT Treatment/Interventions ADLs/Self Care Home Management;Dry needling;Manual techniques;Therapeutic exercise;Therapeutic activities;Neuromuscular re-education;Aquatic Therapy;Biofeedback;Canalith Repostioning;Cryotherapy;Electrical Stimulation;Iontophoresis 2m/ml Dexamethasone;Moist Heat;Ultrasound;Patient/family education;Passive range of motion;Energy conservation;Vestibular;Joint Manipulations;Traction;Gait  training;Balance training;Taping;Spinal Manipulations;Visual/perceptual remediation/compensation    PT Next Visit Plan Perform trigger point dry needling and manual techniques until pt can tolerate active strengthening, additional chronic pain education    PT Home Exercise Plan see above    Consulted and Agree with Plan of Care Patient  Patient will benefit from skilled therapeutic intervention in order to improve the following deficits and impairments:  Impaired perceived functional ability, Impaired UE functional use, Pain, Decreased mobility, Decreased range of motion, Decreased strength, Hypomobility, Impaired flexibility, Increased muscle spasms, Postural dysfunction, Improper body mechanics  Visit Diagnosis: Abnormal posture  Nonintractable headache, unspecified chronicity pattern, unspecified headache type  Cervicalgia     Problem List Patient Active Problem List   Diagnosis Date Noted   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 hauler 12/25/2020   Chronic urticaria 12/25/2020   Moderate persistent asthma, uncomplicated 81/07/3158   Ageusia 08/12/2020   Anosmia 08/12/2020   Myalgia, epidemic 08/12/2020   Cough with exposure to COVID-19 virus 08/12/2020   Olfactory impairment 05/29/2020   Acute UTI 04/23/2020   Left ureteral stone 04/23/2020   Intermittent chest pain 02/09/2020   History of COVID-19 02/09/2020   Shortness of breath 02/09/2020   Loss of taste 02/09/2020   Loss of smell 02/09/2020   Rash 02/09/2020   Arthralgia 02/09/2020   Postoperative history of checked last year 04/22/2017   Weakness of left arm 04/22/2017   Numbness and tingling in left arm 04/22/2017   Lower back pain 04/02/2016   Acute stress disorder 02/25/2015   Anxiety 02/25/2015   Airway hyperreactivity 02/25/2015   Bradycardia 02/25/2015   Hypersomnia 02/25/2015   Clinical depression 02/25/2015   Elevated blood sugar 02/25/2015   Fibrositis 02/25/2015   Acid reflux 02/25/2015   Hepatitis non A  non B 02/25/2015   H/O disease 02/25/2015   Hypercholesteremia 02/25/2015   Headache, migraine 02/25/2015   Muscle ache 02/25/2015   Allergic rhinitis, seasonal 02/25/2015   Avitaminosis D 02/25/2015  Janna Arch, PT, DPT  12/23/2021, 8:08 AM  [place] MAIN [facility] SERVICES [address] [place], [place], [address] Phone: [phone]   Fax:  [phone]  Name: LAILA MYHRE MRN: [id number] Date of Birth: [birth year]

## 2021-12-25 ENCOUNTER — Other Ambulatory Visit
Admission: RE | Admit: 2021-12-25 | Discharge: 2021-12-25 | Disposition: A | Discharge status: Home / Self Care | Payer: 59 | Source: Ambulatory Visit | Attending: Internal Medicine | Admitting: Internal Medicine

## 2021-12-25 ENCOUNTER — Ambulatory Visit: Payer: 59

## 2021-12-25 DIAGNOSIS — R109 Unspecified abdominal pain: Secondary | ICD-10-CM | POA: Insufficient documentation

## 2021-12-25 LAB — COMPREHENSIVE METABOLIC PANEL
ALT: 58 U/L — ABNORMAL HIGH (ref 0–44)
AST: 49 U/L — ABNORMAL HIGH (ref 15–41)
Albumin: 4.3 g/dL (ref 3.5–5.0)
Alkaline Phosphatase: 74 U/L (ref 38–126)
Anion gap: 10 (ref 5–15)
BUN: 16 mg/dL (ref 8–23)
CO2: 28 mmol/L (ref 22–32)
Calcium: 11.1 mg/dL — ABNORMAL HIGH (ref 8.9–10.3)
Chloride: 97 mmol/L — ABNORMAL LOW (ref 98–111)
Creatinine, Ser: 1.02 mg/dL — ABNORMAL HIGH (ref 0.44–1.00)
GFR, Estimated: >60 mL/min (ref >60)
Glucose, Bld: 108 mg/dL — ABNORMAL HIGH (ref 70–99)
Potassium: 3.9 mmol/L (ref 3.5–5.1)
Sodium: 135 mmol/L (ref 135–145)
Total Bilirubin: 0.6 mg/dL (ref 0.3–1.2)
Total Protein: 7 g/dL (ref 6.5–8.1)

## 2021-12-25 LAB — BILIRUBIN, DIRECT: Bilirubin, Direct: <0.1 mg/dL (ref 0.0–0.2)

## 2021-12-25 LAB — LIPASE, BLOOD: Lipase: 48 U/L (ref 11–51)

## 2021-12-26 ENCOUNTER — Ambulatory Visit
Admission: RE | Admit: 2021-12-26 | Discharge: 2021-12-26 | Disposition: A | Discharge status: Home / Self Care | Payer: 59 | Source: Ambulatory Visit | Attending: Internal Medicine | Admitting: Internal Medicine

## 2021-12-26 ENCOUNTER — Other Ambulatory Visit: Payer: Self-pay

## 2021-12-26 DIAGNOSIS — R109 Unspecified abdominal pain: Secondary | ICD-10-CM | POA: Insufficient documentation

## 2021-12-29 ENCOUNTER — Other Ambulatory Visit (HOSPITAL_COMMUNITY): Payer: Self-pay

## 2021-12-29 ENCOUNTER — Ambulatory Visit: Payer: 59

## 2021-12-29 LAB — H PYLORI, IGM, IGG, IGA AB
H Pylori IgG: 0.06 Index Value (ref 0.00–0.79)
H. Pylogi, Iga Abs: <9 units (ref 0.0–8.9)
H. Pylogi, Igm Abs: <9 units (ref 0.0–8.9)

## 2021-12-31 ENCOUNTER — Ambulatory Visit: Payer: 59

## 2022-01-01 ENCOUNTER — Other Ambulatory Visit (HOSPITAL_COMMUNITY): Payer: Self-pay

## 2022-01-02 ENCOUNTER — Ambulatory Visit: Payer: 59 | Attending: Internal Medicine

## 2022-01-02 DIAGNOSIS — Z23 Encounter for immunization: Secondary | ICD-10-CM

## 2022-01-02 NOTE — Progress Notes (Signed)
? ?  Covid-19 Vaccination Clinic ? ?Name:  Rachel Fry    ?MRN: [id number] ?DOB: 03/02/1956 ? ?01/02/2022 ? ?[name] was observed post Covid-19 immunization for 30 minutes based on pre-vaccination screening without incident. She was provided with Vaccine Information Sheet and instruction to access the V-Safe system.  ? ?[name] was instructed to call 911 with any severe reactions post vaccine: ?Difficulty breathing  ?Swelling of face and throat  ?A fast heartbeat  ?A bad rash all over body  ?Dizziness and weakness  ? ?Immunizations Administered   ? ? Name Date Dose VIS Date Route  ? Ambulance person Booster 01/02/2022  2:48 PM 0.3 mL 06/25/2021 Intramuscular  ? Manufacturer: [place]: [phone]  ? [place]: [id number]  ? ?  ? ?

## 2022-01-05 ENCOUNTER — Other Ambulatory Visit: Payer: Self-pay

## 2022-01-05 ENCOUNTER — Ambulatory Visit: Payer: 59

## 2022-01-05 MED ORDER — PFIZER COVID-19 VAC BIVALENT 30 MCG/0.3ML IM SUSP
INTRAMUSCULAR | 0 refills | Status: DC
  Filled 2022-01-05: qty 0.3, 1d supply, fill #0

## 2022-01-06 ENCOUNTER — Other Ambulatory Visit (HOSPITAL_COMMUNITY): Payer: Self-pay

## 2022-01-06 ENCOUNTER — Other Ambulatory Visit: Payer: Self-pay

## 2022-01-06 ENCOUNTER — Ambulatory Visit: Payer: 59 | Attending: Internal Medicine

## 2022-01-06 ENCOUNTER — Ambulatory Visit (INDEPENDENT_AMBULATORY_CARE_PROVIDER_SITE_OTHER): Payer: 59

## 2022-01-06 DIAGNOSIS — R519 Headache, unspecified: Secondary | ICD-10-CM | POA: Insufficient documentation

## 2022-01-06 DIAGNOSIS — M542 Cervicalgia: Secondary | ICD-10-CM | POA: Diagnosis not present

## 2022-01-06 DIAGNOSIS — R293 Abnormal posture: Secondary | ICD-10-CM | POA: Insufficient documentation

## 2022-01-06 DIAGNOSIS — L501 Idiopathic urticaria: Secondary | ICD-10-CM | POA: Diagnosis not present

## 2022-01-06 NOTE — Therapy (Signed)
[place] [facility] [facility] ?[place], [place], [address] ?Phone: [phone]   Fax:  [phone] ? ?Physical Therapy Treatment ? ?Patient Details  ?Name: Rachel Fry ?MRN: [id number] ?Date of Birth: 17-Apr-1956 ?Referring Provider (PT): Dr. Karlton Lemon ? ? ?Encounter Date: 01/06/2022 ? ? PT End of Session - 01/06/22 0752   ? ? Visit Number 17   ? Number of Visits 21   ? Date for PT Re-Evaluation 03/03/22   ? Authorization Type 7/10 PN 11/15   ? PT Start Time 0715   ? PT Stop Time 0754   ? PT Time Calculation (min) 39 min   ? Activity Tolerance Patient tolerated treatment well   ? Behavior During Therapy [facility] for tasks assessed/performed   ? ?  ?  ? ?  ? ? ?Past Medical History:  ?Diagnosis Date  ? Allergy   ? Asthma   ? COVID-19   ? History of kidney stones   ? Hypothyroidism   ? Migraines   ? Numbness and tingling   ? left side of body, occasionally right side   ? S/P Botox injection   ? Urticaria   ? ? ?Past Surgical History:  ?Procedure Laterality Date  ? ADENOIDECTOMY    ? ARM NEUROPLASTY Right 1994 and 1996  ? for chronic pain  ? CESAREAN SECTION    ? CYSTOSCOPY W/ RETROGRADES Left 04/23/2020  ? Procedure: CYSTOSCOPY WITH RETROGRADE PYELOGRAM;  Surgeon: Abbie Sons, MD;  Location: [facility];  Service: Urology;  Laterality: Left;  ? CYSTOSCOPY W/ RETROGRADES Left 05/13/2020  ? Procedure: CYSTOSCOPY WITH RETROGRADE PYELOGRAM;  Surgeon: Hollice Espy, MD;  Location: [facility];  Service: Urology;  Laterality: Left;  ? CYSTOSCOPY WITH STENT PLACEMENT Left 04/23/2020  ? Procedure: CYSTOSCOPY WITH STENT PLACEMENT;  Surgeon: Abbie Sons, MD;  Location: [facility];  Service: Urology;  Laterality: Left;  ? CYSTOSCOPY/URETEROSCOPY/HOLMIUM LASER/STENT PLACEMENT Left 05/13/2020  ? Procedure: CYSTOSCOPY/URETEROSCOPY/LASER/STENT EXCHANGE;  Surgeon: Hollice Espy, MD;  Location: [facility];  Service: Urology;  Laterality: Left;  ? OVARIAN CYST REMOVAL  1973  ? STONE  EXTRACTION WITH BASKET  05/13/2020  ? Procedure: STONE EXTRACTION WITH BASKET;  Surgeon: Hollice Espy, MD;  Location: [facility];  Service: Urology;;  ? TONSILLECTOMY    ? TONSILLECTOMY AND ADENOIDECTOMY    ? TUBAL LIGATION  1988  ? ? ?There were no vitals filed for this visit. ? ? Subjective Assessment - 01/06/22 0750   ? ? Subjective Patient reports she woke up with a little headache. No falls or LOB since last session.   ? Pertinent History Patient is a 66 year old female who presents to physical therapy for migraines with specific referral for dry needling.  Patient has been having migraines for about 14-15 years. They have changed from front temporal all day long 2-3/10, a couple years ago started having tingling in left side of body, numbness in arm and leg. Additionally sometimes get's belly aches.  Changed medication in 2020. Was getting trigger injections but it was too expensive. PMH of HLD, aortic atherosclerosis, asthma, COVID 19, SOB, hypothyroidism, atypical facial pain (2019), neuropathy (2020), fibromyalgia   ? Limitations Reading;House hold activities;Other (comment)   ? How long can you sit comfortably? n/a   ? How long can you stand comfortably? n/a   ? How long can you walk comfortably? n/a   ? Diagnostic tests negative MRI for cervical, thoracic, brain   ? Patient Stated Goals to  help manage symptoms   ? Currently in Pain? Yes   ? Pain Score 3    ? Pain Location Head   ? Pain Orientation Left   ? Pain Descriptors / Indicators Aching   ? Pain Type Chronic pain   ? Pain Onset More than a month ago   ? Pain Frequency Intermittent   ? ?  ?  ? ?  ? ? ? ? ? ? ? ? ?Treatment ?Cervical side bend with overpressure to glenohumeral joint 5x 30 seconds ?Cervical rotation with overpressure to glenohumeral joint: 5x 30 seconds ?Suboccipital release 4x30 seconds ?J mobilization for postural kyphosis reduction 4x 30 seconds ?cervical and thoracic mobilizations grade II for reduced hypomobility x 6 minutes ?Y  RTB overhead movements 10x ?Towel distraction with cervical extension 10x  ?  ?  ?  ?Trigger Point Dry Needling (TDN), unbilled ?Education performed with patient regarding potential benefit of TDN. Reviewed precautions and risks with patient. Reviewed special precautions/risks over lung fields which include pneumothorax. Reviewed signs and symptoms of pneumothorax and advised pt to go to ER immediately if these symptoms develop advise them of dry needling treatment. Extensive time spent with pt to ensure full understanding of TDN risks. Pt provided verbal consent to treatment. TDN performed to  with 0.30 x 30 and .2 x 0.3 single needle placements with local twitch response (LTR). Pistoning technique utilized. Improved pain-free motion following intervention. L and Rcervical paraspinals, Upper trap (L and R), temporalis, suboccipital musculature  x 24 minutes ?  ?  ?  ?  ?Pt educated throughout session about proper posture and technique with exercises. Improved exercise technique, movement at target joints, use of target muscles after min to mod verbal, visual, tactile cues. ?  ? ? ? ?Patient presents with excellent motivation throughout physical therapy session. She is able to tolerate cervical extension without pain with use of towel. One cavitation of L rib 5 performed. Patient has multiple trigger points in bilateral upper traps despite pain being primary L sided. She will benefit from skilled physical therapy to reduce pain, improve patient independence with symptom management, and improve quality of life ? ? ? ? ? ? ? ? ? ? ? ? ? ? ? ? PT Education - 01/06/22 0715   ? ? Education provided Yes   ? Education Details TDN posture   ? Person(s) Educated Patient   ? Methods Explanation;Demonstration;Tactile cues;Verbal cues   ? Comprehension Verbalized understanding;Returned demonstration;Verbal cues required;Tactile cues required   ? ?  ?  ? ?  ? ? ? PT Short Term Goals - 09/09/21 0858   ? ?  ? PT SHORT TERM GOAL #1   ? Title Pt will be independent with HEP in order to decrease pain in order to improve pain-free function at home and work.   ? Baseline 6/7: HEP given 9/1: HEp compliant 11/15: HEP compliant   ? Time 6   ? Period Weeks   ? Status Achieved   ? Target Date 08/07/21   ? ?  ?  ? ?  ? ? ? ? PT Long Term Goals - 12/09/21 0808   ? ?  ? PT LONG TERM GOAL #1  ? Title Patient will increase FOTO score to equal to or greater than 59%    to demonstrate statistically significant improvement in mobility and quality of life.   ? Baseline 6/7: 50% 9/1: 59% 11/15: 86% 2/14: 71%   ? Time 12   ? Period Weeks   ?  Status Achieved   ?  ? PT LONG TERM GOAL #2  ? Title Pt will decrease worst pain as reported on NPRS by at least 3 points (3/10) in order to demonstrate clinically significant reduction in pain.   ? Baseline 6/7: 6/10 9/1: 4/10 11/15: 5/10 2/14: 3-4/10   ? Time 12   ? Period Weeks   ? Status Partially Met   ? Target Date 03/03/22   ?  ? PT LONG TERM GOAL #3  ? Title Pt will demonstrate pain-free full range cervical motion in order to perform IADLs such as driving and household chores without increase in symptoms.   ? Baseline 6/7: see note for limitation 9/1:flexion 54, extension 60, SB R 35 L 30, rotation R 51 L 62 11/15: flexion 58, extension 60, SB R and L 35, R rotation 59 L rotation 60 2/14: flexion 70, extension 60 SB R and L 38, R rotation 59 L 64   ? Time 12   ? Period Weeks   ? Status Partially Met   ? Target Date 03/03/22   ?  ? PT LONG TERM GOAL #4  ? Title Patient will have less than two days a week of high pain migraines and demonstrate ability to self control/contain migraines.   ? Baseline 9/1:every day getting the mild ones, 2x/week for high pain migraines. 11/15: two days of high pain in past month, has mini headaches every day 2/14: has worsening headaches in the evening but only 2-3 days of high pain   ? Time 12   ? Period Weeks   ? Status Partially Met   ? Target Date 03/03/22   ?  ? PT LONG TERM GOAL #5  ?  Title Patient will reduce Neck Disability Index score to <10% to demonstrate minimal disability with ADL?s including improved sleeping tolerance, sitting tolerance, etc for better mobility at home and work.

## 2022-01-07 ENCOUNTER — Other Ambulatory Visit (HOSPITAL_COMMUNITY): Payer: Self-pay

## 2022-01-07 ENCOUNTER — Ambulatory Visit: Payer: 59

## 2022-01-08 ENCOUNTER — Other Ambulatory Visit (HOSPITAL_COMMUNITY): Payer: Self-pay

## 2022-01-08 ENCOUNTER — Other Ambulatory Visit: Payer: Self-pay | Admitting: Family

## 2022-01-09 ENCOUNTER — Other Ambulatory Visit (HOSPITAL_COMMUNITY): Payer: Self-pay

## 2022-01-09 MED ORDER — LEVOTHYROXINE SODIUM 50 MCG PO TABS
50.0000 ug | ORAL_TABLET | Freq: Every day | ORAL | 3 refills | Status: DC
  Filled 2022-01-09: qty 90, 90d supply, fill #0
  Filled 2022-04-03: qty 90, 90d supply, fill #1

## 2022-01-09 MED ORDER — ATORVASTATIN CALCIUM 20 MG PO TABS
20.0000 mg | ORAL_TABLET | Freq: Every day | ORAL | 3 refills | Status: DC
  Filled 2022-01-09: qty 90, 90d supply, fill #0
  Filled 2022-04-03: qty 90, 90d supply, fill #1
  Filled 2022-07-06: qty 90, 90d supply, fill #2
  Filled 2022-10-04: qty 90, 90d supply, fill #3

## 2022-01-12 ENCOUNTER — Ambulatory Visit: Payer: 59

## 2022-01-12 ENCOUNTER — Other Ambulatory Visit (HOSPITAL_COMMUNITY): Payer: Self-pay

## 2022-01-14 ENCOUNTER — Ambulatory Visit: Payer: 59

## 2022-01-14 ENCOUNTER — Other Ambulatory Visit (HOSPITAL_COMMUNITY): Payer: Self-pay

## 2022-01-18 ENCOUNTER — Other Ambulatory Visit (HOSPITAL_COMMUNITY): Payer: Self-pay

## 2022-01-19 ENCOUNTER — Ambulatory Visit: Payer: 59

## 2022-01-19 ENCOUNTER — Other Ambulatory Visit (HOSPITAL_COMMUNITY): Payer: Self-pay

## 2022-01-19 MED ORDER — AIMOVIG 140 MG/ML ~~LOC~~ SOAJ
SUBCUTANEOUS | 0 refills | Status: DC
  Filled 2022-01-19: qty 1, 30d supply, fill #0

## 2022-01-20 ENCOUNTER — Other Ambulatory Visit (HOSPITAL_COMMUNITY): Payer: Self-pay

## 2022-01-20 ENCOUNTER — Ambulatory Visit: Payer: 59

## 2022-01-20 ENCOUNTER — Other Ambulatory Visit: Payer: Self-pay

## 2022-01-20 DIAGNOSIS — R519 Headache, unspecified: Secondary | ICD-10-CM | POA: Diagnosis not present

## 2022-01-20 DIAGNOSIS — M542 Cervicalgia: Secondary | ICD-10-CM

## 2022-01-20 DIAGNOSIS — R293 Abnormal posture: Secondary | ICD-10-CM

## 2022-01-20 NOTE — Therapy (Signed)
[facility] ?[place], [place], [address] ?Phone: [phone]   Fax:  [phone] ? ?Physical Therapy Treatment ? ?Patient Details  ?Name: Rachel Fry ?MRN: [id number] ?Date of Birth: 1956-05-07 ?Referring Provider (PT): Dr. Karlton Lemon ? ? ?Encounter Date: 01/20/2022 ? ? PT End of Session - 01/20/22 0711   ? ? Visit Number 18   ? Number of Visits 21   ? Date for PT Re-Evaluation 03/03/22   ? Authorization Type 8/10 PN 11/15   ? PT Start Time [id number]   ? PT Stop Time 0800   ? PT Time Calculation (min) 46 min   ? Activity Tolerance Patient tolerated treatment well   ? Behavior During Therapy [facility] for tasks assessed/performed   ? ?  ?  ? ?  ? ? ?Past Medical History:  ?Diagnosis Date  ? Allergy   ? Asthma   ? COVID-19   ? History of kidney stones   ? Hypothyroidism   ? Migraines   ? Numbness and tingling   ? left side of body, occasionally right side   ? S/P Botox injection   ? Urticaria   ? ? ?Past Surgical History:  ?Procedure Laterality Date  ? ADENOIDECTOMY    ? ARM NEUROPLASTY Right 1994 and 1996  ? for chronic pain  ? CESAREAN SECTION    ? CYSTOSCOPY W/ RETROGRADES Left 04/23/2020  ? Procedure: CYSTOSCOPY WITH RETROGRADE PYELOGRAM;  Surgeon: Abbie Sons, MD;  Location: [facility];  Service: Urology;  Laterality: Left;  ? CYSTOSCOPY W/ RETROGRADES Left 05/13/2020  ? Procedure: CYSTOSCOPY WITH RETROGRADE PYELOGRAM;  Surgeon: Hollice Espy, MD;  Location: [facility];  Service: Urology;  Laterality: Left;  ? CYSTOSCOPY WITH STENT PLACEMENT Left 04/23/2020  ? Procedure: CYSTOSCOPY WITH STENT PLACEMENT;  Surgeon: Abbie Sons, MD;  Location: [facility];  Service: Urology;  Laterality: Left;  ? CYSTOSCOPY/URETEROSCOPY/HOLMIUM LASER/STENT PLACEMENT Left 05/13/2020  ? Procedure: CYSTOSCOPY/URETEROSCOPY/LASER/STENT EXCHANGE;  Surgeon: Hollice Espy, MD;  Location: [facility];  Service: Urology;  Laterality: Left;  ? OVARIAN CYST REMOVAL  1973  ? STONE  EXTRACTION WITH BASKET  05/13/2020  ? Procedure: STONE EXTRACTION WITH BASKET;  Surgeon: Hollice Espy, MD;  Location: [facility];  Service: Urology;;  ? TONSILLECTOMY    ? TONSILLECTOMY AND ADENOIDECTOMY    ? TUBAL LIGATION  1988  ? ? ?There were no vitals filed for this visit. ? ? Subjective Assessment - 01/20/22 0809   ? ? Subjective Patient reports she has had to take her migraine medication three times since she was seen last. Continues to have migraines.   ? Pertinent History Patient is a 66 year old female who presents to physical therapy for migraines with specific referral for dry needling.  Patient has been having migraines for about 14-15 years. They have changed from front temporal all day long 2-3/10, a couple years ago started having tingling in left side of body, numbness in arm and leg. Additionally sometimes get's belly aches.  Changed medication in 2020. Was getting trigger injections but it was too expensive. PMH of HLD, aortic atherosclerosis, asthma, COVID 19, SOB, hypothyroidism, atypical facial pain (2019), neuropathy (2020), fibromyalgia   ? Limitations Reading;House hold activities;Other (comment)   ? How long can you sit comfortably? n/a   ? How long can you stand comfortably? n/a   ? How long can you walk comfortably? n/a   ? Diagnostic tests negative MRI for cervical, thoracic, brain   ?  Patient Stated Goals to help manage symptoms   ? Currently in Pain? Yes   ? Pain Score 2    ? Pain Location Head   ? Pain Orientation Left   ? Pain Descriptors / Indicators Aching   ? Pain Type Chronic pain   ? Pain Onset More than a month ago   ? Pain Frequency Intermittent   ? ?  ?  ? ?  ? ? ? ? ? ? ?  ?  ?Treatment ?Cervical side bend with overpressure to glenohumeral joint 5x 30 seconds ?Cervical rotation with overpressure to glenohumeral joint: 5x 30 seconds ?Suboccipital release 4x30 seconds ?J mobilization for postural kyphosis reduction 4x 30 seconds ?cervical and thoracic mobilizations grade II  for reduced hypomobility x 6 minutes ?Y RTB overhead movements 10x ?RTB ER 15x ?Towel distraction with cervical extension 10x  ?Seated trunk extension over half foam roller 10x   ?  ?  ?Trigger Point Dry Needling (TDN), unbilled ?Education performed with patient regarding potential benefit of TDN. Reviewed precautions and risks with patient. Reviewed special precautions/risks over lung fields which include pneumothorax. Reviewed signs and symptoms of pneumothorax and advised pt to go to ER immediately if these symptoms develop advise them of dry needling treatment. Extensive time spent with pt to ensure full understanding of TDN risks. Pt provided verbal consent to treatment. TDN performed to  with 0.30 x 30 and .2 x 0.3 single needle placements with local twitch response (LTR). Pistoning technique utilized. Improved pain-free motion following intervention. L and Rcervical paraspinals, Upper trap (L and R), temporalis, suboccipital musculature  x 24 minutes ?  ?  ?  ?  ?Pt educated throughout session about proper posture and technique with exercises. Improved exercise technique, movement at target joints, use of target muscles after min to mod verbal, visual, tactile cues. ?  ?  ? ?Patient has multiple trigger points of L upper trap that are released with TDN. Symptomatic needling to suboccipitals release pain and relieve symptoms. Patient is highly motivated for pain reduction. Postural strengthening tolerated well. She will benefit from skilled physical therapy to reduce pain, improve patient independence with symptom management, and improve quality of life ? ? ? ? ? ? ? ? ? ? ? ? ? ? ? ? ? ? ? ? PT Education - 01/20/22 0710   ? ? Education provided Yes   ? Education Details TND, body mechanics   ? Person(s) Educated Patient   ? Methods Explanation;Demonstration;Tactile cues;Verbal cues   ? Comprehension Verbalized understanding;Returned demonstration;Verbal cues required;Tactile cues required   ? ?  ?  ? ?  ? ? ?  PT Short Term Goals - 09/09/21 0858   ? ?  ? PT SHORT TERM GOAL #1  ? Title Pt will be independent with HEP in order to decrease pain in order to improve pain-free function at home and work.   ? Baseline 6/7: HEP given 9/1: HEp compliant 11/15: HEP compliant   ? Time 6   ? Period Weeks   ? Status Achieved   ? Target Date 08/07/21   ? ?  ?  ? ?  ? ? ? ? PT Long Term Goals - 12/09/21 0808   ? ?  ? PT LONG TERM GOAL #1  ? Title Patient will increase FOTO score to equal to or greater than 59%    to demonstrate statistically significant improvement in mobility and quality of life.   ? Baseline 6/7: 50% 9/1: 59% 11/15: 86%  2/14: 71%   ? Time 12   ? Period Weeks   ? Status Achieved   ?  ? PT LONG TERM GOAL #2  ? Title Pt will decrease worst pain as reported on NPRS by at least 3 points (3/10) in order to demonstrate clinically significant reduction in pain.   ? Baseline 6/7: 6/10 9/1: 4/10 11/15: 5/10 2/14: 3-4/10   ? Time 12   ? Period Weeks   ? Status Partially Met   ? Target Date 03/03/22   ?  ? PT LONG TERM GOAL #3  ? Title Pt will demonstrate pain-free full range cervical motion in order to perform IADLs such as driving and household chores without increase in symptoms.   ? Baseline 6/7: see note for limitation 9/1:flexion 54, extension 60, SB R 35 L 30, rotation R 51 L 62 11/15: flexion 58, extension 60, SB R and L 35, R rotation 59 L rotation 60 2/14: flexion 70, extension 60 SB R and L 38, R rotation 59 L 64   ? Time 12   ? Period Weeks   ? Status Partially Met   ? Target Date 03/03/22   ?  ? PT LONG TERM GOAL #4  ? Title Patient will have less than two days a week of high pain migraines and demonstrate ability to self control/contain migraines.   ? Baseline 9/1:every day getting the mild ones, 2x/week for high pain migraines. 11/15: two days of high pain in past month, has mini headaches every day 2/14: has worsening headaches in the evening but only 2-3 days of high pain   ? Time 12   ? Period Weeks   ? Status  Partially Met   ? Target Date 03/03/22   ?  ? PT LONG TERM GOAL #5  ? Title Patient will reduce Neck Disability Index score to <10% to demonstrate minimal disability with ADL?s including improved sleeping toler

## 2022-01-21 ENCOUNTER — Ambulatory Visit: Payer: 59

## 2022-01-22 ENCOUNTER — Other Ambulatory Visit (HOSPITAL_COMMUNITY): Payer: Self-pay

## 2022-01-22 MED ORDER — NURTEC 75 MG PO TBDP
ORAL_TABLET | ORAL | 5 refills | Status: DC
  Filled 2022-04-03: qty 16, 30d supply, fill #0
  Filled 2022-05-01: qty 16, 30d supply, fill #1

## 2022-01-29 ENCOUNTER — Other Ambulatory Visit (HOSPITAL_COMMUNITY): Payer: Self-pay

## 2022-02-03 ENCOUNTER — Ambulatory Visit (INDEPENDENT_AMBULATORY_CARE_PROVIDER_SITE_OTHER): Payer: 59

## 2022-02-03 ENCOUNTER — Ambulatory Visit: Payer: 59 | Attending: Internal Medicine

## 2022-02-03 DIAGNOSIS — R519 Headache, unspecified: Secondary | ICD-10-CM | POA: Insufficient documentation

## 2022-02-03 DIAGNOSIS — M542 Cervicalgia: Secondary | ICD-10-CM | POA: Insufficient documentation

## 2022-02-03 DIAGNOSIS — L501 Idiopathic urticaria: Secondary | ICD-10-CM

## 2022-02-03 DIAGNOSIS — R293 Abnormal posture: Secondary | ICD-10-CM | POA: Insufficient documentation

## 2022-02-03 NOTE — Therapy (Signed)
[facility] ?[place], [place], [address] ?Phone: [phone]   Fax:  [phone] ? ?Physical Therapy Treatment ? ?Patient Details  ?Name: Rachel Fry ?MRN: [id number] ?Date of Birth: 1956/07/25 ?Referring Provider (PT): Dr. Karlton Lemon ? ? ?Encounter Date: 02/03/2022 ? ? PT End of Session - 02/03/22 0713   ? ? Visit Number 19   ? Number of Visits 21   ? Date for PT Re-Evaluation 03/03/22   ? Authorization Type 9/10 PN 11/15   ? PT Start Time 0715   ? PT Stop Time 0759   ? PT Time Calculation (min) 44 min   ? Activity Tolerance Patient tolerated treatment well   ? Behavior During Therapy [facility] for tasks assessed/performed   ? ?  ?  ? ?  ? ? ?Past Medical History:  ?Diagnosis Date  ? Allergy   ? Asthma   ? COVID-19   ? History of kidney stones   ? Hypothyroidism   ? Migraines   ? Numbness and tingling   ? left side of body, occasionally right side   ? S/P Botox injection   ? Urticaria   ? ? ?Past Surgical History:  ?Procedure Laterality Date  ? ADENOIDECTOMY    ? ARM NEUROPLASTY Right 1994 and 1996  ? for chronic pain  ? CESAREAN SECTION    ? CYSTOSCOPY W/ RETROGRADES Left 04/23/2020  ? Procedure: CYSTOSCOPY WITH RETROGRADE PYELOGRAM;  Surgeon: Abbie Sons, MD;  Location: [facility];  Service: Urology;  Laterality: Left;  ? CYSTOSCOPY W/ RETROGRADES Left 05/13/2020  ? Procedure: CYSTOSCOPY WITH RETROGRADE PYELOGRAM;  Surgeon: Hollice Espy, MD;  Location: [facility];  Service: Urology;  Laterality: Left;  ? CYSTOSCOPY WITH STENT PLACEMENT Left 04/23/2020  ? Procedure: CYSTOSCOPY WITH STENT PLACEMENT;  Surgeon: Abbie Sons, MD;  Location: [facility];  Service: Urology;  Laterality: Left;  ? CYSTOSCOPY/URETEROSCOPY/HOLMIUM LASER/STENT PLACEMENT Left 05/13/2020  ? Procedure: CYSTOSCOPY/URETEROSCOPY/LASER/STENT EXCHANGE;  Surgeon: Hollice Espy, MD;  Location: [facility];  Service: Urology;  Laterality: Left;  ? OVARIAN CYST REMOVAL  1973  ? STONE  EXTRACTION WITH BASKET  05/13/2020  ? Procedure: STONE EXTRACTION WITH BASKET;  Surgeon: Hollice Espy, MD;  Location: [facility];  Service: Urology;;  ? TONSILLECTOMY    ? TONSILLECTOMY AND ADENOIDECTOMY    ? TUBAL LIGATION  1988  ? ? ?There were no vitals filed for this visit. ? ? Subjective Assessment - 02/03/22 0747   ? ? Subjective Patient reports she has had increased migraines. Has had to have increased medicine.   ? Pertinent History Patient is a 66 year old female who presents to physical therapy for migraines with specific referral for dry needling.  Patient has been having migraines for about 14-15 years. They have changed from front temporal all day long 2-3/10, a couple years ago started having tingling in left side of body, numbness in arm and leg. Additionally sometimes get's belly aches.  Changed medication in 2020. Was getting trigger injections but it was too expensive. PMH of HLD, aortic atherosclerosis, asthma, COVID 19, SOB, hypothyroidism, atypical facial pain (2019), neuropathy (2020), fibromyalgia   ? Limitations Reading;House hold activities;Other (comment)   ? How long can you sit comfortably? n/a   ? How long can you stand comfortably? n/a   ? How long can you walk comfortably? n/a   ? Diagnostic tests negative MRI for cervical, thoracic, brain   ? Patient Stated Goals to help manage symptoms   ?  Currently in Pain? Yes   ? Pain Score 5    ? Pain Location Head   ? Pain Orientation Left   ? Pain Descriptors / Indicators Aching   ? Pain Type Chronic pain   ? Pain Onset More than a month ago   ? Pain Frequency Intermittent   ? ?  ?  ? ?  ? ? ? ? ? ? ? ? ? ? ?  ?Treatment ?Cervical side bend with overpressure to glenohumeral joint 5x 30 seconds ?Cervical rotation with overpressure to glenohumeral joint: 5x 30 seconds ?Suboccipital release 4x30 seconds ?J mobilization for postural kyphosis reduction 4x 30 seconds ?cervical and thoracic mobilizations grade II for reduced hypomobility x 6  minutes ?Y RTB overhead movements 10x ?RTB ER 15x ?Seated trunk extension over half foam roller 10x   ?  ?  ?Trigger Point Dry Needling (TDN), unbilled ?Education performed with patient regarding potential benefit of TDN. Reviewed precautions and risks with patient. Reviewed special precautions/risks over lung fields which include pneumothorax. Reviewed signs and symptoms of pneumothorax and advised pt to go to ER immediately if these symptoms develop advise them of dry needling treatment. Extensive time spent with pt to ensure full understanding of TDN risks. Pt provided verbal consent to treatment. TDN performed to  with 0.30 x 30 and .2 x 0.3 single needle placements with local twitch response (LTR). Pistoning technique utilized. Improved pain-free motion following intervention. L and Rcervical paraspinals, Upper trap (L and R), temporalis, suboccipital musculature  x 24 minutes ? ? ?Patient has increased tension in her cervical musculature that is relieved with TDN. Her hypomobility is reduced by end of session with gentle mobilizations to spine. Patient did take her medication prior to session so relief is uncertain by end of session from medication vs PT. She will benefit from skilled physical therapy to reduce pain, improve patient independence with symptom management, and improve quality of life ? ? ? ? ? ? ? ? ? ? ? ? ? ? ? PT Education - 02/03/22 0713   ? ? Education provided Yes   ? Education Details TDN, pain reduction   ? Person(s) Educated Patient   ? Methods Explanation;Demonstration;Tactile cues;Verbal cues   ? Comprehension Verbalized understanding;Returned demonstration;Verbal cues required;Tactile cues required   ? ?  ?  ? ?  ? ? ? PT Short Term Goals - 09/09/21 0858   ? ?  ? PT SHORT TERM GOAL #1  ? Title Pt will be independent with HEP in order to decrease pain in order to improve pain-free function at home and work.   ? Baseline 6/7: HEP given 9/1: HEp compliant 11/15: HEP compliant   ? Time 6    ? Period Weeks   ? Status Achieved   ? Target Date 08/07/21   ? ?  ?  ? ?  ? ? ? ? PT Long Term Goals - 12/09/21 0808   ? ?  ? PT LONG TERM GOAL #1  ? Title Patient will increase FOTO score to equal to or greater than 59%    to demonstrate statistically significant improvement in mobility and quality of life.   ? Baseline 6/7: 50% 9/1: 59% 11/15: 86% 2/14: 71%   ? Time 12   ? Period Weeks   ? Status Achieved   ?  ? PT LONG TERM GOAL #2  ? Title Pt will decrease worst pain as reported on NPRS by at least 3 points (3/10) in order to demonstrate  clinically significant reduction in pain.   ? Baseline 6/7: 6/10 9/1: 4/10 11/15: 5/10 2/14: 3-4/10   ? Time 12   ? Period Weeks   ? Status Partially Met   ? Target Date 03/03/22   ?  ? PT LONG TERM GOAL #3  ? Title Pt will demonstrate pain-free full range cervical motion in order to perform IADLs such as driving and household chores without increase in symptoms.   ? Baseline 6/7: see note for limitation 9/1:flexion 54, extension 60, SB R 35 L 30, rotation R 51 L 62 11/15: flexion 58, extension 60, SB R and L 35, R rotation 59 L rotation 60 2/14: flexion 70, extension 60 SB R and L 38, R rotation 59 L 64   ? Time 12   ? Period Weeks   ? Status Partially Met   ? Target Date 03/03/22   ?  ? PT LONG TERM GOAL #4  ? Title Patient will have less than two days a week of high pain migraines and demonstrate ability to self control/contain migraines.   ? Baseline 9/1:every day getting the mild ones, 2x/week for high pain migraines. 11/15: two days of high pain in past month, has mini headaches every day 2/14: has worsening headaches in the evening but only 2-3 days of high pain   ? Time 12   ? Period Weeks   ? Status Partially Met   ? Target Date 03/03/22   ?  ? PT LONG TERM GOAL #5  ? Title Patient will reduce Neck Disability Index score to <10% to demonstrate minimal disability with ADL?s including improved sleeping tolerance, sitting tolerance, etc for better mobility at home and  work.   ? Baseline 2/14: 18%   ? Time 12   ? Period Weeks   ? Status New   ? Target Date 03/03/22   ? ?  ?  ? ?  ? ? ? ? ? ? ? ? Plan - 02/03/22 0758   ? ? Clinical Impression Statement Patient has increased tension i

## 2022-02-09 ENCOUNTER — Other Ambulatory Visit (HOSPITAL_COMMUNITY): Payer: Self-pay

## 2022-02-12 ENCOUNTER — Other Ambulatory Visit (HOSPITAL_COMMUNITY): Payer: Self-pay

## 2022-02-17 ENCOUNTER — Ambulatory Visit: Payer: 59

## 2022-02-17 DIAGNOSIS — M542 Cervicalgia: Secondary | ICD-10-CM

## 2022-02-17 DIAGNOSIS — R293 Abnormal posture: Secondary | ICD-10-CM

## 2022-02-17 DIAGNOSIS — R519 Headache, unspecified: Secondary | ICD-10-CM

## 2022-02-17 NOTE — Therapy (Signed)
[place] ?[facility] ?[place], [place], [address] ?Phone: [phone]   Fax:  [phone] ? ?Physical Therapy Treatment/ Physical Therapy Progress Note ? ? ?Dates of reporting period  09/09/22   to   02/17/22  ? ?Patient Details  ?Name: Rachel Fry ?MRN: [id number] ?Date of Birth: [birth year] ?Referring Provider (PT): Dr. Karlton Lemon ? ? ?Encounter Date: 02/17/2022 ? ? PT End of Session - 02/17/22 0723   ? ? Visit Number 20   ? Number of Visits 21   ? Date for PT Re-Evaluation 03/03/22   ? Authorization Type 10/10 PN 11/15; next session 1/10 pn 4/25   ? PT Start Time 0715   ? PT Stop Time 0759   ? PT Time Calculation (min) 44 min   ? Activity Tolerance Patient tolerated treatment well   ? Behavior During Therapy [facility] for tasks assessed/performed   ? ?  ?  ? ?  ? ? ?Past Medical History:  ?Diagnosis Date  ? Allergy   ? Asthma   ? COVID-19   ? History of kidney stones   ? Hypothyroidism   ? Migraines   ? Numbness and tingling   ? left side of body, occasionally right side   ? S/P Botox injection   ? Urticaria   ? ? ?Past Surgical History:  ?Procedure Laterality Date  ? ADENOIDECTOMY    ? ARM NEUROPLASTY Right 1994 and 1996  ? for chronic pain  ? CESAREAN SECTION    ? CYSTOSCOPY W/ RETROGRADES Left 04/23/2020  ? Procedure: CYSTOSCOPY WITH RETROGRADE PYELOGRAM;  Surgeon: Abbie Sons, MD;  Location: [facility];  Service: Urology;  Laterality: Left;  ? CYSTOSCOPY W/ RETROGRADES Left 05/13/2020  ? Procedure: CYSTOSCOPY WITH RETROGRADE PYELOGRAM;  Surgeon: Hollice Espy, MD;  Location: [facility];  Service: Urology;  Laterality: Left;  ? CYSTOSCOPY WITH STENT PLACEMENT Left 04/23/2020  ? Procedure: CYSTOSCOPY WITH STENT PLACEMENT;  Surgeon: Abbie Sons, MD;  Location: [facility];  Service: Urology;  Laterality: Left;  ? CYSTOSCOPY/URETEROSCOPY/HOLMIUM LASER/STENT PLACEMENT Left 05/13/2020  ? Procedure: CYSTOSCOPY/URETEROSCOPY/LASER/STENT EXCHANGE;  Surgeon: Hollice Espy, MD;  Location: [facility];  Service: Urology;  Laterality: Left;  ? OVARIAN CYST REMOVAL  1973  ? STONE EXTRACTION WITH BASKET  05/13/2020  ? Procedure: STONE EXTRACTION WITH BASKET;  Surgeon: Hollice Espy, MD;  Location: [facility];  Service: Urology;;  ? TONSILLECTOMY    ? TONSILLECTOMY AND ADENOIDECTOMY    ? TUBAL LIGATION  1988  ? ? ?There were no vitals filed for this visit. ? ? Subjective Assessment - 02/17/22 0722   ? ? Subjective Patient has 3-4/10 pain daily lately. Is having issues with spatial awareness today.   ? Pertinent History Patient is a 66 year old female who presents to physical therapy for migraines with specific referral for dry needling.  Patient has been having migraines for about 14-15 years. They have changed from front temporal all day long 2-3/10, a couple years ago started having tingling in left side of body, numbness in arm and leg. Additionally sometimes get's belly aches.  Changed medication in 2020. Was getting trigger injections but it was too expensive. PMH of HLD, aortic atherosclerosis, asthma, COVID 19, SOB, hypothyroidism, atypical facial pain (2019), neuropathy (2020), fibromyalgia   ? Limitations Reading;House hold activities;Other (comment)   ? How long can you sit comfortably? n/a   ? How long can you stand comfortably? n/a   ? How long can you  walk comfortably? n/a   ? Diagnostic tests negative MRI for cervical, thoracic, brain   ? Patient Stated Goals to help manage symptoms   ? Currently in Pain? Yes   ? Pain Score 4    ? Pain Location Head   ? Pain Orientation Left   ? Pain Descriptors / Indicators Aching   ? Pain Type Chronic pain   ? Pain Onset More than a month ago   ? Pain Frequency Intermittent   ? ?  ?  ? ?  ? ? ? ?  ? Goals:  ?VAS: 6/10  ?Cervical ROM  ? ? ? Right Left  ?Flexion 70  ?Extension 52  ?Side Bending 45 40  ?Rotation 42 60  ? ? ?<2 days a week of high pain: 2 days of high pain , 3-4/10 pain daily ?NDI: 16%  ? ?  ?Treatment ?Cervical side bend  with overpressure to glenohumeral joint 5x 30 seconds ?Cervical rotation with overpressure to glenohumeral joint: 5x 30 seconds ?Suboccipital release 4x30 seconds ?J mobilization for postural kyphosis reduction 4x 30 seconds ?cervical and thoracic mobilizations grade II for reduced hypomobility x 6 minutes ? ?  ?  ?Trigger Point Dry Needling (TDN), unbilled ?Education performed with patient regarding potential benefit of TDN. Reviewed precautions and risks with patient. Reviewed special precautions/risks over lung fields which include pneumothorax. Reviewed signs and symptoms of pneumothorax and advised pt to go to ER immediately if these symptoms develop advise them of dry needling treatment. Extensive time spent with pt to ensure full understanding of TDN risks. Pt provided verbal consent to treatment. TDN performed to  with 0.30 x 30 and .2 x 0.3 single needle placements with local twitch response (LTR). Pistoning technique utilized. Improved pain-free motion following intervention. L and Rcervical paraspinals, Upper trap (L and R), temporalis, suboccipital musculature  x 24 minutes ?  ? Patient's condition has the potential to improve in response to therapy. Maximum improvement is yet to be obtained. The anticipated improvement is attainable and reasonable in a generally predictable time.  Patient reports dry needling helps her migraines.  ? ? ?Patient's goals assessed for progress note today. She has had slight increase in worse VAS due to migraines with changing weather and work demands but pain is only worse on ~ 2 days a week indicating decreased frequency of high pain days. Patient's condition has the potential to improve in response to therapy. Maximum improvement is yet to be obtained. The anticipated improvement is attainable and reasonable in a generally predictable time. She will benefit from skilled physical therapy to reduce pain, improve patient independence with symptom management, and improve  quality of life ? ?  ? ? ? ? ? ? ? ? ? ? ? ? ? ? ? ? ? ? ? ? ? ? ? ? ? ? PT Education - 02/17/22 0722   ? ? Education provided Yes   ? Education Details progress note, pain reduction   ? Person(s) Educated Patient   ? Methods Explanation;Demonstration;Tactile cues;Verbal cues   ? Comprehension Verbalized understanding;Returned demonstration;Verbal cues required;Tactile cues required   ? ?  ?  ? ?  ? ? ? PT Short Term Goals - 09/09/21 0858   ? ?  ? PT SHORT TERM GOAL #1  ? Title Pt will be independent with HEP in order to decrease pain in order to improve pain-free function at home and work.   ? Baseline 6/7: HEP given 9/1: HEp compliant 11/15: HEP compliant   ?  Time 6   ? Period Weeks   ? Status Achieved   ? Target Date 08/07/21   ? ?  ?  ? ?  ? ? ? ? PT Long Term Goals - 02/17/22 0805   ? ?  ? PT LONG TERM GOAL #1  ? Title Patient will increase FOTO score to equal to or greater than 59%    to demonstrate statistically significant improvement in mobility and quality of life.   ? Baseline 6/7: 50% 9/1: 59% 11/15: 86% 2/14: 71%   ? Time 12   ? Period Weeks   ? Status Achieved   ?  ? PT LONG TERM GOAL #2  ? Title Pt will decrease worst pain as reported on NPRS by at least 3 points (3/10) in order to demonstrate clinically significant reduction in pain.   ? Baseline 6/7: 6/10 9/1: 4/10 11/15: 5/10 2/14: 3-4/10 4/25: 6/10   ? Time 12   ? Period Weeks   ? Status Partially Met   ? Target Date 03/03/22   ?  ? PT LONG TERM GOAL #3  ? Title Pt will demonstrate pain-free full range cervical motion in order to perform IADLs such as driving and household chores without increase in symptoms.   ? Baseline 6/7: see note for limitation 9/1:flexion 54, extension 60, SB R 35 L 30, rotation R 51 L 62 11/15: flexion 58, extension 60, SB R and L 35, R rotation 59 L rotation 60 2/14: flexion 70, extension 60 SB R and L 38, R rotation 59 L 64 4/25: see note   ? Time 12   ? Period Weeks   ? Status Partially Met   ? Target Date 03/03/22   ?   ? PT LONG TERM GOAL #4  ? Title Patient will have less than two days a week of high pain migraines and demonstrate ability to self control/contain migraines.   ? Baseline 9/1:every day getting the mild one

## 2022-02-19 ENCOUNTER — Other Ambulatory Visit: Payer: Self-pay | Admitting: Neurology

## 2022-02-19 ENCOUNTER — Other Ambulatory Visit (HOSPITAL_COMMUNITY): Payer: Self-pay

## 2022-02-19 DIAGNOSIS — G43711 Chronic migraine without aura, intractable, with status migrainosus: Secondary | ICD-10-CM

## 2022-02-20 ENCOUNTER — Other Ambulatory Visit (HOSPITAL_COMMUNITY): Payer: Self-pay

## 2022-02-23 ENCOUNTER — Telehealth: Payer: Self-pay | Admitting: Neurology

## 2022-02-23 NOTE — Telephone Encounter (Signed)
Referral for Neurology sent to [facility] [phone]. ?

## 2022-02-25 ENCOUNTER — Other Ambulatory Visit (HOSPITAL_COMMUNITY): Payer: Self-pay

## 2022-02-25 ENCOUNTER — Institutional Professional Consult (permissible substitution): Payer: 59 | Admitting: Neurology

## 2022-02-28 ENCOUNTER — Other Ambulatory Visit (HOSPITAL_COMMUNITY): Payer: Self-pay

## 2022-03-01 MED FILL — Zafirlukast Tab 10 MG: ORAL | 90 days supply | Qty: 180 | Fill #3 | Status: AC

## 2022-03-02 ENCOUNTER — Other Ambulatory Visit (HOSPITAL_COMMUNITY): Payer: Self-pay

## 2022-03-03 ENCOUNTER — Ambulatory Visit: Payer: 59 | Attending: Internal Medicine

## 2022-03-03 ENCOUNTER — Other Ambulatory Visit (HOSPITAL_COMMUNITY): Payer: Self-pay

## 2022-03-03 ENCOUNTER — Ambulatory Visit (INDEPENDENT_AMBULATORY_CARE_PROVIDER_SITE_OTHER): Payer: 59

## 2022-03-03 DIAGNOSIS — M542 Cervicalgia: Secondary | ICD-10-CM | POA: Insufficient documentation

## 2022-03-03 DIAGNOSIS — R519 Headache, unspecified: Secondary | ICD-10-CM | POA: Diagnosis not present

## 2022-03-03 DIAGNOSIS — R293 Abnormal posture: Secondary | ICD-10-CM | POA: Diagnosis not present

## 2022-03-03 DIAGNOSIS — M25511 Pain in right shoulder: Secondary | ICD-10-CM | POA: Insufficient documentation

## 2022-03-03 DIAGNOSIS — G8929 Other chronic pain: Secondary | ICD-10-CM | POA: Diagnosis not present

## 2022-03-03 DIAGNOSIS — L501 Idiopathic urticaria: Secondary | ICD-10-CM

## 2022-03-03 NOTE — Therapy (Signed)
[place] ?[facility] ?[place], [place], [address] ?Phone: [phone]   Fax:  [phone] ? ?Physical Therapy Treatment /RECERT ? ?Patient Details  ?Name: Rachel Fry ?MRN: [id number] ?Date of Birth: 10-Jun-1956 ?Referring Provider (PT): Dr. Karlton Lemon ? ? ?Encounter Date: 03/03/2022 ? ? PT End of Session - 03/03/22 0752   ? ? Visit Number 21   ? Number of Visits 27   ? Date for PT Re-Evaluation 05/26/22   ? Authorization Type 1/10 pn 4/25   ? PT Start Time [phone]   ? PT Stop Time 0758   ? PT Time Calculation (min) 44 min   ? Activity Tolerance Patient tolerated treatment well   ? Behavior During Therapy [facility] for tasks assessed/performed   ? ?  ?  ? ?  ? ? ?Past Medical History:  ?Diagnosis Date  ? Allergy   ? Asthma   ? COVID-19   ? History of kidney stones   ? Hypothyroidism   ? Migraines   ? Numbness and tingling   ? left side of body, occasionally right side   ? S/P Botox injection   ? Urticaria   ? ? ?Past Surgical History:  ?Procedure Laterality Date  ? ADENOIDECTOMY    ? ARM NEUROPLASTY Right 1994 and 1996  ? for chronic pain  ? CESAREAN SECTION    ? CYSTOSCOPY W/ RETROGRADES Left 04/23/2020  ? Procedure: CYSTOSCOPY WITH RETROGRADE PYELOGRAM;  Surgeon: Abbie Sons, MD;  Location: [facility];  Service: Urology;  Laterality: Left;  ? CYSTOSCOPY W/ RETROGRADES Left 05/13/2020  ? Procedure: CYSTOSCOPY WITH RETROGRADE PYELOGRAM;  Surgeon: Hollice Espy, MD;  Location: [facility];  Service: Urology;  Laterality: Left;  ? CYSTOSCOPY WITH STENT PLACEMENT Left 04/23/2020  ? Procedure: CYSTOSCOPY WITH STENT PLACEMENT;  Surgeon: Abbie Sons, MD;  Location: [facility];  Service: Urology;  Laterality: Left;  ? CYSTOSCOPY/URETEROSCOPY/HOLMIUM LASER/STENT PLACEMENT Left 05/13/2020  ? Procedure: CYSTOSCOPY/URETEROSCOPY/LASER/STENT EXCHANGE;  Surgeon: Hollice Espy, MD;  Location: [facility];  Service: Urology;  Laterality: Left;  ? OVARIAN CYST REMOVAL  1973  ? STONE  EXTRACTION WITH BASKET  05/13/2020  ? Procedure: STONE EXTRACTION WITH BASKET;  Surgeon: Hollice Espy, MD;  Location: [facility];  Service: Urology;;  ? TONSILLECTOMY    ? TONSILLECTOMY AND ADENOIDECTOMY    ? TUBAL LIGATION  1988  ? ? ?There were no vitals filed for this visit. ? ? Subjective Assessment - 03/03/22 0716   ? ? Subjective Patient migraine medication needs a pre-authorization and was not given in time. Patient will meet with new migraine physician in September. So it has been 6 weeks since last injection, has not had it yet.   ? Pertinent History Patient is a 66 year old female who presents to physical therapy for migraines with specific referral for dry needling.  Patient has been having migraines for about 14-15 years. They have changed from front temporal all day long 2-3/10, a couple years ago started having tingling in left side of body, numbness in arm and leg. Additionally sometimes get's belly aches.  Changed medication in 2020. Was getting trigger injections but it was too expensive. PMH of HLD, aortic atherosclerosis, asthma, COVID 19, SOB, hypothyroidism, atypical facial pain (2019), neuropathy (2020), fibromyalgia   ? Limitations Reading;House hold activities;Other (comment)   ? How long can you sit comfortably? n/a   ? How long can you stand comfortably? n/a   ? How long can you walk  comfortably? n/a   ? Diagnostic tests negative MRI for cervical, thoracic, brain   ? Patient Stated Goals to help manage symptoms   ? Currently in Pain? Yes   ? Pain Score 2    ? Pain Location Head   ? Pain Orientation Left   ? Pain Descriptors / Indicators Aching   ? Pain Type Chronic pain   ? Pain Onset More than a month ago   ? Pain Frequency Intermittent   ? ?  ?  ? ?  ? ?Patient migraine medication needs a pre-authorization and was not given in time. Patient will meet with new migraine physician in September. So it has been 6 weeks since last injection, has not had it yet.  ? ? ?RECERT ?Goals performed  02/17/22 please refer to this note for further details.  ? ?Treatment ?Cervical side bend with overpressure to glenohumeral joint 5x 30 seconds ?Cervical rotation with overpressure to glenohumeral joint: 5x 30 seconds ?Suboccipital release 4x30 seconds ?J mobilization for postural kyphosis reduction 4x 30 seconds ?cervical and thoracic mobilizations grade II for reduced hypomobility x 6 minutes ? median nerve glide 10x each UE  ?RTB Y overhead reach 15x  ?  ?  ?Trigger Point Dry Needling (TDN), unbilled ?Education performed with patient regarding potential benefit of TDN. Reviewed precautions and risks with patient. Reviewed special precautions/risks over lung fields which include pneumothorax. Reviewed signs and symptoms of pneumothorax and advised pt to go to ER immediately if these symptoms develop advise them of dry needling treatment. Extensive time spent with pt to ensure full understanding of TDN risks. Pt provided verbal consent to treatment. TDN performed to  with 0.30 x 30 and .2 x 0.3 single needle placements with local twitch response (LTR). Pistoning technique utilized. Improved pain-free motion following intervention. L and Rcervical paraspinals, Upper trap (L and R), temporalis, suboccipital musculature  x 24 minutes ? ? ? ? ? ? ? ?Goals performed 02/17/22 please refer to this note for further details. Patient continues to have migraines impact her quality of life and will benefit from continuation of care. Patient does have decreased pain levels despite the high frequency of migraines. Multiple trigger points released this session with increased muscle tissue length noted by end of session. She will benefit from skilled physical therapy to reduce pain, improve patient independence with symptom management, and improve quality of life ? ? ? ? ? ? ? ? ? ? ? ? ? ? ? ? PT Education - 03/03/22 0713   ? ? Education provided Yes   ? Education Details recert, pain reduction   ? Person(s) Educated Patient   ?  Methods Explanation;Demonstration;Tactile cues;Verbal cues   ? Comprehension Verbalized understanding;Returned demonstration;Verbal cues required;Tactile cues required   ? ?  ?  ? ?  ? ? ? PT Short Term Goals - 09/09/21 0858   ? ?  ? PT SHORT TERM GOAL #1  ? Title Pt will be independent with HEP in order to decrease pain in order to improve pain-free function at home and work.   ? Baseline 6/7: HEP given 9/1: HEp compliant 11/15: HEP compliant   ? Time 6   ? Period Weeks   ? Status Achieved   ? Target Date 08/07/21   ? ?  ?  ? ?  ? ? ? ? PT Long Term Goals - 03/03/22 0753   ? ?  ? PT LONG TERM GOAL #1  ? Title Patient will increase FOTO score to  equal to or greater than 59%    to demonstrate statistically significant improvement in mobility and quality of life.   ? Baseline 6/7: 50% 9/1: 59% 11/15: 86% 2/14: 71%   ? Time 12   ? Period Weeks   ? Status Achieved   ?  ? PT LONG TERM GOAL #2  ? Title Pt will decrease worst pain as reported on NPRS by at least 3 points (3/10) in order to demonstrate clinically significant reduction in pain.   ? Baseline 6/7: 6/10 9/1: 4/10 11/15: 5/10 2/14: 3-4/10 4/25: 6/10   ? Time 12   ? Period Weeks   ? Status Partially Met   ? Target Date 05/26/22   ?  ? PT LONG TERM GOAL #3  ? Title Pt will demonstrate pain-free full range cervical motion in order to perform IADLs such as driving and household chores without increase in symptoms.   ? Baseline 6/7: see note for limitation 9/1:flexion 54, extension 60, SB R 35 L 30, rotation R 51 L 62 11/15: flexion 58, extension 60, SB R and L 35, R rotation 59 L rotation 60 2/14: flexion 70, extension 60 SB R and L 38, R rotation 59 L 64 4/25: see note   ? Time 12   ? Period Weeks   ? Status Partially Met   ? Target Date 05/26/22   ?  ? PT LONG TERM GOAL #4  ? Title Patient will have less than two days a week of high pain migraines and demonstrate ability to self control/contain migraines.   ? Baseline 9/1:every day getting the mild ones, 2x/week  for high pain migraines. 11/15: two days of high pain in past month, has mini headaches every day 2/14: has worsening headaches in the evening but only 2-3 days of high pain 4/25: 2 days of high pain   ? Tim

## 2022-03-04 ENCOUNTER — Other Ambulatory Visit (HOSPITAL_COMMUNITY): Payer: Self-pay

## 2022-03-06 ENCOUNTER — Other Ambulatory Visit (HOSPITAL_COMMUNITY): Payer: Self-pay

## 2022-03-09 ENCOUNTER — Other Ambulatory Visit (HOSPITAL_COMMUNITY): Payer: Self-pay

## 2022-03-11 ENCOUNTER — Other Ambulatory Visit (HOSPITAL_COMMUNITY): Payer: Self-pay

## 2022-03-17 ENCOUNTER — Ambulatory Visit: Payer: 59

## 2022-03-17 DIAGNOSIS — G8929 Other chronic pain: Secondary | ICD-10-CM

## 2022-03-17 DIAGNOSIS — M25511 Pain in right shoulder: Secondary | ICD-10-CM | POA: Diagnosis not present

## 2022-03-17 DIAGNOSIS — M542 Cervicalgia: Secondary | ICD-10-CM

## 2022-03-17 DIAGNOSIS — R293 Abnormal posture: Secondary | ICD-10-CM | POA: Diagnosis not present

## 2022-03-17 DIAGNOSIS — R519 Headache, unspecified: Secondary | ICD-10-CM | POA: Diagnosis not present

## 2022-03-17 NOTE — Therapy (Signed)
[place] MAIN [facility] SERVICES [address] [place], [place], [address] Phone: [phone]   Fax:  [phone]  Physical Therapy Treatment  Patient Details  Name: Rachel Fry MRN: [id number] Date of Birth: October 17, 1956 Referring Provider (PT): Dr. Karlton Lemon   Encounter Date: 03/17/2022   PT End of Session - 03/17/22 0759     Visit Number 22    Number of Visits 27    Date for PT Re-Evaluation 05/26/22    PT Start Time 0719    PT Stop Time 0758    PT Time Calculation (min) 39 min    Activity Tolerance Patient tolerated treatment well    Behavior During Therapy [facility] for tasks assessed/performed             Past Medical History:  Diagnosis Date   Allergy    Asthma    COVID-19    History of kidney stones    Hypothyroidism    Migraines    Numbness and tingling    left side of body, occasionally right side    S/P Botox injection    Urticaria     Past Surgical History:  Procedure Laterality Date   ADENOIDECTOMY     ARM NEUROPLASTY Right 1994 and 1996   for chronic pain   CESAREAN SECTION     CYSTOSCOPY W/ RETROGRADES Left 04/23/2020   Procedure: CYSTOSCOPY WITH RETROGRADE PYELOGRAM;  Surgeon: Abbie Sons, MD;  Location: [facility];  Service: Urology;  Laterality: Left;   CYSTOSCOPY W/ RETROGRADES Left 05/13/2020   Procedure: CYSTOSCOPY WITH RETROGRADE PYELOGRAM;  Surgeon: Hollice Espy, MD;  Location: [facility];  Service: Urology;  Laterality: Left;   CYSTOSCOPY WITH STENT PLACEMENT Left 04/23/2020   Procedure: CYSTOSCOPY WITH STENT PLACEMENT;  Surgeon: Abbie Sons, MD;  Location: [facility];  Service: Urology;  Laterality: Left;   CYSTOSCOPY/URETEROSCOPY/HOLMIUM LASER/STENT PLACEMENT Left 05/13/2020   Procedure: CYSTOSCOPY/URETEROSCOPY/LASER/STENT EXCHANGE;  Surgeon: Hollice Espy, MD;  Location: [facility];  Service: Urology;  Laterality: Left;   OVARIAN CYST REMOVAL  1973   STONE EXTRACTION WITH BASKET  05/13/2020    Procedure: STONE EXTRACTION WITH BASKET;  Surgeon: Hollice Espy, MD;  Location: [facility];  Service: Urology;;   TONSILLECTOMY     TONSILLECTOMY AND ADENOIDECTOMY     TUBAL LIGATION  1988    There were no vitals filed for this visit.   Subjective Assessment - 03/17/22 0721     Subjective No updates since last session.    Currently in Pain? Yes    Pain Score 2     Pain Location --   left sided occular headache             INTERVENTION:  In prone: -Trigger point dry needling 0.25x43m at Left UT (3 sticks), Rt UT (2 sticks); 0.30x457mto Rt cervical paraspinals C5-6 level *twitch response noted at all levels and improved length of muscle tissue  -Cervical extensor myofascial stretch x 60sec  -ART to bilat UT in sitting paired with A/ROM cervical rotation -ART to cervical extensors paired with capital flexion -Rotation + capital nod C0/1 mobilization x20 bilat -C0/1 Grade 4 mobilization 1x30sec bilat  -shoulder lateral raises 1x10 bilat 3lb FW (neutral chin)  -seated cable row downward 12.5lb 1x10         PT Education - 03/17/22 0759     Education provided Yes    Education Details low muscle mass in UT generally and worse on left    Person(s) Educated  Patient    Methods Explanation              PT Short Term Goals - 09/09/21 0858       PT SHORT TERM GOAL #1   Title Pt will be independent with HEP in order to decrease pain in order to improve pain-free function at home and work.    Baseline 6/7: HEP given 9/1: HEp compliant 11/15: HEP compliant    Time 6    Period Weeks    Status Achieved    Target Date 08/07/21               PT Long Term Goals - 03/03/22 0753       PT LONG TERM GOAL #1   Title Patient will increase FOTO score to equal to or greater than 59%    to demonstrate statistically significant improvement in mobility and quality of life.    Baseline 6/7: 50% 9/1: 59% 11/15: 86% 2/14: 71%    Time 12    Period Weeks    Status  Achieved      PT LONG TERM GOAL #2   Title Pt will decrease worst pain as reported on NPRS by at least 3 points (3/10) in order to demonstrate clinically significant reduction in pain.    Baseline 6/7: 6/10 9/1: 4/10 11/15: 5/10 2/14: 3-4/10 4/25: 6/10    Time 12    Period Weeks    Status Partially Met    Target Date 05/26/22      PT LONG TERM GOAL #3   Title Pt will demonstrate pain-free full range cervical motion in order to perform IADLs such as driving and household chores without increase in symptoms.    Baseline 6/7: see note for limitation 9/1:flexion 54, extension 60, SB R 35 L 30, rotation R 51 L 62 11/15: flexion 58, extension 60, SB R and L 35, R rotation 59 L rotation 60 2/14: flexion 70, extension 60 SB R and L 38, R rotation 59 L 64 4/25: see note    Time 12    Period Weeks    Status Partially Met    Target Date 05/26/22      PT LONG TERM GOAL #4   Title Patient will have less than two days a week of high pain migraines and demonstrate ability to self control/contain migraines.    Baseline 9/1:every day getting the mild ones, 2x/week for high pain migraines. 11/15: two days of high pain in past month, has mini headaches every day 2/14: has worsening headaches in the evening but only 2-3 days of high pain 4/25: 2 days of high pain    Time 12    Period Weeks    Status Partially Met    Target Date 05/26/22      PT LONG TERM GOAL #5   Title Patient will reduce Neck Disability Index score to <10% to demonstrate minimal disability with ADL's including improved sleeping tolerance, sitting tolerance, etc for better mobility at home and work.    Baseline 2/14: 18% 4/25: 16%    Time 12    Period Weeks    Status Partially Met    Target Date 05/26/22                   Plan - 03/17/22 0800     Clinical Impression Statement Continued with TPDN and mysofacial release work to address trigger points, taut bands, and limited ROM. Pain improve within session. Education on  generla activaiton of groups adn postural awareness.    Personal Factors and Comorbidities Age;Behavior Pattern;Comorbidity 3+;Fitness;Past/Current Experience;Profession;Time since onset of injury/illness/exacerbation    Comorbidities HLD, aortic atherosclerosis, asthma, COVID 19, SOB, hypothyroidism, atypical facial pain (2019), neuropathy (2020), fibromyalgia    Examination-Activity Limitations Caring for Others;Carry;Lift;Sleep;Transfers    Examination-Participation Restrictions Cleaning;Community Activity;Interpersonal Relationship;Driving;Laundry;Shop;Occupation;Meal Prep;Yard Work    [employer] officer Evolving/Moderate complexity    Clinical Decision Making Moderate    Rehab Potential Fair    PT Frequency Biweekly    PT Duration 12 weeks    PT Treatment/Interventions ADLs/Self Care Home Management;Dry needling;Manual techniques;Therapeutic exercise;Therapeutic activities;Neuromuscular re-education;Aquatic Therapy;Biofeedback;Canalith Repostioning;Cryotherapy;Electrical Stimulation;Iontophoresis 50m/ml Dexamethasone;Moist Heat;Ultrasound;Patient/family education;Passive range of motion;Energy conservation;Vestibular;Joint Manipulations;Traction;Gait training;Balance training;Taping;Spinal Manipulations;Visual/perceptual remediation/compensation    Consulted and Agree with Plan of Care Patient             Patient will benefit from skilled therapeutic intervention in order to improve the following deficits and impairments:  Impaired perceived functional ability, Impaired UE functional use, Pain, Decreased mobility, Decreased range of motion, Decreased strength, Hypomobility, Impaired flexibility, Increased muscle spasms, Postural dysfunction, Improper body mechanics  Visit Diagnosis: Abnormal posture  Nonintractable headache, unspecified chronicity pattern, unspecified headache type  Cervicalgia  Chronic right shoulder pain     Problem List Patient Active Problem  List   Diagnosis Date Noted   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 hauler 12/25/2020   Chronic urticaria 12/25/2020   Moderate persistent asthma, uncomplicated 035/67/0141  Ageusia 08/12/2020   Anosmia 08/12/2020   Myalgia, epidemic 08/12/2020   Cough with exposure to COVID-19 virus 08/12/2020   Olfactory impairment 05/29/2020   Acute UTI 04/23/2020   Left ureteral stone 04/23/2020   Intermittent chest pain 02/09/2020   History of COVID-19 02/09/2020   Shortness of breath 02/09/2020   Loss of taste 02/09/2020   Loss of smell 02/09/2020   Rash 02/09/2020   Arthralgia 02/09/2020   Postoperative history of checked last year 04/22/2017   Weakness of left arm 04/22/2017   Numbness and tingling in left arm 04/22/2017   Lower back pain 04/02/2016   Acute stress disorder 02/25/2015   Anxiety 02/25/2015   Airway hyperreactivity 02/25/2015   Bradycardia 02/25/2015   Hypersomnia 02/25/2015   Clinical depression 02/25/2015   Elevated blood sugar 02/25/2015   Fibrositis 02/25/2015   Acid reflux 02/25/2015   Hepatitis non A non B 02/25/2015   H/O disease 02/25/2015   Hypercholesteremia 02/25/2015   Headache, migraine 02/25/2015   Muscle ache 02/25/2015   Allergic rhinitis, seasonal 02/25/2015   Avitaminosis D 02/25/2015   8:18 AM, 03/17/22 AEtta Grandchild PT, DPT Physical Therapist - [phone]    BP[place]coag PT 03/17/2022, 8:05 AM  [facility] [address] [address]Phone: [phone]  Fax:  [phone] Name: Rachel TRAUGHBERMRN: [id number] of Birth: 805-Jun-1957

## 2022-03-18 ENCOUNTER — Other Ambulatory Visit (HOSPITAL_COMMUNITY): Payer: Self-pay

## 2022-03-19 ENCOUNTER — Other Ambulatory Visit: Payer: Self-pay | Admitting: Allergy & Immunology

## 2022-03-19 ENCOUNTER — Other Ambulatory Visit (HOSPITAL_COMMUNITY): Payer: Self-pay

## 2022-03-19 MED ORDER — FLUTICASONE PROPIONATE HFA 110 MCG/ACT IN AERO
2.0000 | INHALATION_SPRAY | Freq: Two times a day (BID) | RESPIRATORY_TRACT | 0 refills | Status: DC
  Filled 2022-03-19: qty 12, 30d supply, fill #0

## 2022-03-24 ENCOUNTER — Other Ambulatory Visit (HOSPITAL_COMMUNITY): Payer: Self-pay

## 2022-03-26 ENCOUNTER — Other Ambulatory Visit (HOSPITAL_COMMUNITY): Payer: Self-pay

## 2022-03-27 ENCOUNTER — Other Ambulatory Visit (HOSPITAL_COMMUNITY): Payer: Self-pay

## 2022-03-30 ENCOUNTER — Other Ambulatory Visit (HOSPITAL_COMMUNITY): Payer: Self-pay

## 2022-03-31 ENCOUNTER — Ambulatory Visit: Payer: 59 | Attending: Internal Medicine

## 2022-03-31 ENCOUNTER — Ambulatory Visit (INDEPENDENT_AMBULATORY_CARE_PROVIDER_SITE_OTHER): Payer: 59

## 2022-03-31 DIAGNOSIS — R519 Headache, unspecified: Secondary | ICD-10-CM | POA: Diagnosis not present

## 2022-03-31 DIAGNOSIS — R293 Abnormal posture: Secondary | ICD-10-CM | POA: Diagnosis not present

## 2022-03-31 DIAGNOSIS — E039 Hypothyroidism, unspecified: Secondary | ICD-10-CM | POA: Diagnosis not present

## 2022-03-31 DIAGNOSIS — L501 Idiopathic urticaria: Secondary | ICD-10-CM | POA: Diagnosis not present

## 2022-03-31 DIAGNOSIS — E559 Vitamin D deficiency, unspecified: Secondary | ICD-10-CM | POA: Diagnosis not present

## 2022-03-31 DIAGNOSIS — M542 Cervicalgia: Secondary | ICD-10-CM | POA: Diagnosis not present

## 2022-03-31 DIAGNOSIS — E785 Hyperlipidemia, unspecified: Secondary | ICD-10-CM | POA: Diagnosis not present

## 2022-03-31 DIAGNOSIS — I10 Essential (primary) hypertension: Secondary | ICD-10-CM | POA: Diagnosis not present

## 2022-03-31 NOTE — Therapy (Signed)
[place] MAIN [facility] SERVICES [place], [place], [address] Phone: [phone]   Fax:  [phone]  Physical Therapy Treatment  Patient Details  Name: Rachel Fry MRN: [id number] Date of Birth: 03/10/1956 Referring Provider (PT): Dr. Karlton Lemon   Encounter Date: 03/31/2022   PT End of Session - 03/31/22 1434     Visit Number 23    Number of Visits 27    Date for PT Re-Evaluation 05/26/22    PT Start Time 1345    PT Stop Time 1429    PT Time Calculation (min) 44 min    Activity Tolerance Patient tolerated treatment well    Behavior During Therapy WFL for tasks assessed/performed             Past Medical History:  Diagnosis Date   Allergy    Asthma    COVID-19    History of kidney stones    Hypothyroidism    Migraines    Numbness and tingling    left side of body, occasionally right side    S/P Botox injection    Urticaria     Past Surgical History:  Procedure Laterality Date   ADENOIDECTOMY     ARM NEUROPLASTY Right 1994 and 1996   for chronic pain   CESAREAN SECTION     CYSTOSCOPY W/ RETROGRADES Left 04/23/2020   Procedure: CYSTOSCOPY WITH RETROGRADE PYELOGRAM;  Surgeon: Abbie Sons, MD;  Location: [facility];  Service: Urology;  Laterality: Left;   CYSTOSCOPY W/ RETROGRADES Left 05/13/2020   Procedure: CYSTOSCOPY WITH RETROGRADE PYELOGRAM;  Surgeon: Hollice Espy, MD;  Location: [facility];  Service: Urology;  Laterality: Left;   CYSTOSCOPY WITH STENT PLACEMENT Left 04/23/2020   Procedure: CYSTOSCOPY WITH STENT PLACEMENT;  Surgeon: Abbie Sons, MD;  Location: [facility];  Service: Urology;  Laterality: Left;   CYSTOSCOPY/URETEROSCOPY/HOLMIUM LASER/STENT PLACEMENT Left 05/13/2020   Procedure: CYSTOSCOPY/URETEROSCOPY/LASER/STENT EXCHANGE;  Surgeon: Hollice Espy, MD;  Location: [facility];  Service: Urology;  Laterality: Left;   OVARIAN CYST REMOVAL  1973   STONE EXTRACTION WITH BASKET  05/13/2020    Procedure: STONE EXTRACTION WITH BASKET;  Surgeon: Hollice Espy, MD;  Location: [facility];  Service: Urology;;   TONSILLECTOMY     TONSILLECTOMY AND ADENOIDECTOMY     TUBAL LIGATION  1988    There were no vitals filed for this visit.   Subjective Assessment - 03/31/22 1348     Subjective Patient is able to see physician in August due to increase in migraines and pain.    Pertinent History Patient is a 66 year old female who presents to physical therapy for migraines with specific referral for dry needling.  Patient has been having migraines for about 14-15 years. They have changed from front temporal all day long 2-3/10, a couple years ago started having tingling in left side of body, numbness in arm and leg. Additionally sometimes get's belly aches.  Changed medication in 2020. Was getting trigger injections but it was too expensive. PMH of HLD, aortic atherosclerosis, asthma, COVID 19, SOB, hypothyroidism, atypical facial pain (2019), neuropathy (2020), fibromyalgia    Limitations Reading;House hold activities;Other (comment)    How long can you sit comfortably? n/a    How long can you stand comfortably? n/a    How long can you walk comfortably? n/a    Diagnostic tests negative MRI for cervical, thoracic, brain    Patient Stated Goals to help manage symptoms    Currently in Pain?  Yes    Pain Score 4     Pain Location Head    Pain Orientation Left;Right    Pain Descriptors / Indicators Aching    Pain Type Chronic pain    Pain Frequency Intermittent                 Treatment Cervical side bend with overpressure to glenohumeral joint 5x 30 seconds Cervical rotation with overpressure to glenohumeral joint: 5x 30 seconds Suboccipital release 4x30 seconds J mobilization for postural kyphosis reduction 4x 30 seconds cervical and thoracic mobilizations grade II for reduced hypomobility x 6 minutes      Trigger Point Dry Needling (TDN), unbilled Education performed with  patient regarding potential benefit of TDN. Reviewed precautions and risks with patient. Reviewed special precautions/risks over lung fields which include pneumothorax. Reviewed signs and symptoms of pneumothorax and advised pt to go to ER immediately if these symptoms develop advise them of dry needling treatment. Extensive time spent with pt to ensure full understanding of TDN risks. Pt provided verbal consent to treatment. TDN performed to  with 0.30 x 30 and .2 x 0.3 single needle placements with local twitch response (LTR). Pistoning technique utilized. Improved pain-free motion following intervention. L and Rcervical paraspinals, Upper trap (L and R), temporalis, suboccipital musculature  x 24 minutes      Patient tolerates TDN this session with multiple referral patterning. She has significant release of musculature. Patient is highly motivated for pain reduction as she has recently had an increase in migraines. She will benefit from skilled physical therapy to reduce pain, improve patient independence with symptom management, and improve quality of life                       PT Education - 03/31/22 1431     Education provided Yes    Education Details TDN, migraine release    Person(s) Educated Patient    Methods Explanation;Demonstration;Tactile cues;Verbal cues    Comprehension Verbalized understanding;Verbal cues required;Returned demonstration;Tactile cues required              PT Short Term Goals - 09/09/21 0858       PT SHORT TERM GOAL #1   Title Pt will be independent with HEP in order to decrease pain in order to improve pain-free function at home and work.    Baseline 6/7: HEP given 9/1: HEp compliant 11/15: HEP compliant    Time 6    Period Weeks    Status Achieved    Target Date 08/07/21               PT Long Term Goals - 03/03/22 0753       PT LONG TERM GOAL #1   Title Patient will increase FOTO score to equal to or greater than 59%     to demonstrate statistically significant improvement in mobility and quality of life.    Baseline 6/7: 50% 9/1: 59% 11/15: 86% 2/14: 71%    Time 12    Period Weeks    Status Achieved      PT LONG TERM GOAL #2   Title Pt will decrease worst pain as reported on NPRS by at least 3 points (3/10) in order to demonstrate clinically significant reduction in pain.    Baseline 6/7: 6/10 9/1: 4/10 11/15: 5/10 2/14: 3-4/10 4/25: 6/10    Time 12    Period Weeks    Status Partially Met    Target Date 05/26/22  PT LONG TERM GOAL #3   Title Pt will demonstrate pain-free full range cervical motion in order to perform IADLs such as driving and household chores without increase in symptoms.    Baseline 6/7: see note for limitation 9/1:flexion 54, extension 60, SB R 35 L 30, rotation R 51 L 62 11/15: flexion 58, extension 60, SB R and L 35, R rotation 59 L rotation 60 2/14: flexion 70, extension 60 SB R and L 38, R rotation 59 L 64 4/25: see note    Time 12    Period Weeks    Status Partially Met    Target Date 05/26/22      PT LONG TERM GOAL #4   Title Patient will have less than two days a week of high pain migraines and demonstrate ability to self control/contain migraines.    Baseline 9/1:every day getting the mild ones, 2x/week for high pain migraines. 11/15: two days of high pain in past month, has mini headaches every day 2/14: has worsening headaches in the evening but only 2-3 days of high pain 4/25: 2 days of high pain    Time 12    Period Weeks    Status Partially Met    Target Date 05/26/22      PT LONG TERM GOAL #5   Title Patient will reduce Neck Disability Index score to <10% to demonstrate minimal disability with ADL's including improved sleeping tolerance, sitting tolerance, etc for better mobility at home and work.    Baseline 2/14: 18% 4/25: 16%    Time 12    Period Weeks    Status Partially Met    Target Date 05/26/22                   Plan - 03/31/22 1436      Clinical Impression Statement Patient tolerates TDN this session with multiple referral patterning. She has significant release of musculature. Patient is highly motivated for pain reduction as she has recently had an increase in migraines. She will benefit from skilled physical therapy to reduce pain, improve patient independence with symptom management, and improve quality of life    Personal Factors and Comorbidities Age;Behavior Pattern;Comorbidity 3+;Fitness;Past/Current Experience;Profession;Time since onset of injury/illness/exacerbation    Comorbidities HLD, aortic atherosclerosis, asthma, COVID 19, SOB, hypothyroidism, atypical facial pain (2019), neuropathy (2020), fibromyalgia    Examination-Activity Limitations Caring for Others;Carry;Lift;Sleep;Transfers    Examination-Participation Restrictions Cleaning;Community Activity;Interpersonal Relationship;Driving;Laundry;Shop;Occupation;Meal Prep;Yard Work    [employer] officer Evolving/Moderate complexity    Rehab Potential Fair    PT Frequency Biweekly    PT Duration 12 weeks    PT Treatment/Interventions ADLs/Self Care Home Management;Dry needling;Manual techniques;Therapeutic exercise;Therapeutic activities;Neuromuscular re-education;Aquatic Therapy;Biofeedback;Canalith Repostioning;Cryotherapy;Electrical Stimulation;Iontophoresis 70m/ml Dexamethasone;Moist Heat;Ultrasound;Patient/family education;Passive range of motion;Energy conservation;Vestibular;Joint Manipulations;Traction;Gait training;Balance training;Taping;Spinal Manipulations;Visual/perceptual remediation/compensation    Consulted and Agree with Plan of Care Patient             Patient will benefit from skilled therapeutic intervention in order to improve the following deficits and impairments:  Impaired perceived functional ability, Impaired UE functional use, Pain, Decreased mobility, Decreased range of motion, Decreased strength, Hypomobility, Impaired  flexibility, Increased muscle spasms, Postural dysfunction, Improper body mechanics  Visit Diagnosis: Abnormal posture  Nonintractable headache, unspecified chronicity pattern, unspecified headache type  Cervicalgia     Problem List Patient Active Problem List   Diagnosis Date Noted   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 hauler 12/25/2020   Chronic urticaria 12/25/2020   Moderate persistent asthma, uncomplicated 035/57/3220  Ageusia 08/12/2020  Anosmia 08/12/2020   Myalgia, epidemic 08/12/2020   Cough with exposure to COVID-19 virus 08/12/2020   Olfactory impairment 05/29/2020   Acute UTI 04/23/2020   Left ureteral stone 04/23/2020   Intermittent chest pain 02/09/2020   History of COVID-19 02/09/2020   Shortness of breath 02/09/2020   Loss of taste 02/09/2020   Loss of smell 02/09/2020   Rash 02/09/2020   Arthralgia 02/09/2020   Postoperative history of checked last year 04/22/2017   Weakness of left arm 04/22/2017   Numbness and tingling in left arm 04/22/2017   Lower back pain 04/02/2016   Acute stress disorder 02/25/2015   Anxiety 02/25/2015   Airway hyperreactivity 02/25/2015   Bradycardia 02/25/2015   Hypersomnia 02/25/2015   Clinical depression 02/25/2015   Elevated blood sugar 02/25/2015   Fibrositis 02/25/2015   Acid reflux 02/25/2015   Hepatitis non A non B 02/25/2015   H/O disease 02/25/2015   Hypercholesteremia 02/25/2015   Headache, migraine 02/25/2015   Muscle ache 02/25/2015   Allergic rhinitis, seasonal 02/25/2015   Avitaminosis D 02/25/2015   Janna Arch, PT, DPT  03/31/2022, 2:37 PM  [facility] MAIN [facility] SERVICES [address] [place], [place], [address] Phone: [phone]   Fax:  [phone]  Name: Rachel Fry MRN: [id number] Date of Birth: December 01, 1955

## 2022-04-02 ENCOUNTER — Other Ambulatory Visit (HOSPITAL_COMMUNITY): Payer: Self-pay

## 2022-04-02 DIAGNOSIS — Z0001 Encounter for general adult medical examination with abnormal findings: Secondary | ICD-10-CM | POA: Diagnosis not present

## 2022-04-02 DIAGNOSIS — G629 Polyneuropathy, unspecified: Secondary | ICD-10-CM | POA: Diagnosis not present

## 2022-04-02 DIAGNOSIS — K581 Irritable bowel syndrome with constipation: Secondary | ICD-10-CM | POA: Diagnosis not present

## 2022-04-02 DIAGNOSIS — I10 Essential (primary) hypertension: Secondary | ICD-10-CM | POA: Diagnosis not present

## 2022-04-02 DIAGNOSIS — G43909 Migraine, unspecified, not intractable, without status migrainosus: Secondary | ICD-10-CM | POA: Diagnosis not present

## 2022-04-02 DIAGNOSIS — K219 Gastro-esophageal reflux disease without esophagitis: Secondary | ICD-10-CM | POA: Diagnosis not present

## 2022-04-02 DIAGNOSIS — R5383 Other fatigue: Secondary | ICD-10-CM | POA: Diagnosis not present

## 2022-04-02 MED ORDER — LINZESS 145 MCG PO CAPS
ORAL_CAPSULE | ORAL | 3 refills | Status: DC
  Filled 2022-04-02: qty 30, 30d supply, fill #0
  Filled 2022-05-01: qty 30, 30d supply, fill #1
  Filled 2022-06-02: qty 30, 30d supply, fill #2
  Filled 2022-06-30: qty 30, 30d supply, fill #3

## 2022-04-03 ENCOUNTER — Other Ambulatory Visit (HOSPITAL_COMMUNITY): Payer: Self-pay

## 2022-04-04 ENCOUNTER — Other Ambulatory Visit (HOSPITAL_COMMUNITY): Payer: Self-pay

## 2022-04-04 MED ORDER — LEVOTHYROXINE SODIUM 50 MCG PO TABS
50.0000 ug | ORAL_TABLET | Freq: Every day | ORAL | 3 refills | Status: DC
  Filled 2022-04-04 – 2022-07-06 (×3): qty 90, 90d supply, fill #0
  Filled 2022-10-04: qty 90, 90d supply, fill #1
  Filled 2023-01-05: qty 90, 90d supply, fill #2
  Filled 2023-04-02: qty 90, 90d supply, fill #3

## 2022-04-06 ENCOUNTER — Other Ambulatory Visit (HOSPITAL_COMMUNITY): Payer: Self-pay

## 2022-04-07 ENCOUNTER — Other Ambulatory Visit (HOSPITAL_COMMUNITY): Payer: Self-pay

## 2022-04-09 ENCOUNTER — Other Ambulatory Visit (HOSPITAL_COMMUNITY): Payer: Self-pay

## 2022-04-11 ENCOUNTER — Other Ambulatory Visit: Payer: Self-pay | Admitting: Allergy & Immunology

## 2022-04-11 ENCOUNTER — Other Ambulatory Visit (HOSPITAL_COMMUNITY): Payer: Self-pay

## 2022-04-14 ENCOUNTER — Telehealth: Payer: Self-pay | Admitting: Allergy & Immunology

## 2022-04-14 ENCOUNTER — Other Ambulatory Visit (HOSPITAL_COMMUNITY): Payer: Self-pay

## 2022-04-14 MED ORDER — FLUTICASONE PROPIONATE HFA 110 MCG/ACT IN AERO
2.0000 | INHALATION_SPRAY | Freq: Two times a day (BID) | RESPIRATORY_TRACT | 0 refills | Status: DC
  Filled 2022-04-14: qty 12, 30d supply, fill #0

## 2022-04-14 NOTE — Telephone Encounter (Signed)
Refill has been sent in patient will receive a 90 day refill at her office appointment.

## 2022-04-14 NOTE — Telephone Encounter (Signed)
Patient called and made appointment for 05/12/2022. And she need to get her flovent inhaler 90 day refilled. [facility] [place] out patient / [phone].pm

## 2022-04-17 ENCOUNTER — Other Ambulatory Visit (HOSPITAL_COMMUNITY): Payer: Self-pay

## 2022-04-20 ENCOUNTER — Other Ambulatory Visit (HOSPITAL_COMMUNITY): Payer: Self-pay

## 2022-04-21 ENCOUNTER — Other Ambulatory Visit (HOSPITAL_COMMUNITY): Payer: Self-pay

## 2022-04-22 ENCOUNTER — Other Ambulatory Visit (HOSPITAL_COMMUNITY): Payer: Self-pay

## 2022-04-23 ENCOUNTER — Ambulatory Visit: Payer: 59

## 2022-04-27 ENCOUNTER — Other Ambulatory Visit (HOSPITAL_COMMUNITY): Payer: Self-pay

## 2022-04-27 ENCOUNTER — Ambulatory Visit: Payer: 59 | Attending: Internal Medicine

## 2022-04-27 DIAGNOSIS — M542 Cervicalgia: Secondary | ICD-10-CM | POA: Insufficient documentation

## 2022-04-27 DIAGNOSIS — R519 Headache, unspecified: Secondary | ICD-10-CM | POA: Diagnosis not present

## 2022-04-27 DIAGNOSIS — R293 Abnormal posture: Secondary | ICD-10-CM | POA: Insufficient documentation

## 2022-04-27 NOTE — Therapy (Signed)
OUTPATIENT PHYSICAL THERAPY TREATMENT NOTE   Patient Name: Rachel Fry MRN: [id number] DOB:1956/05/09, 65 y.o., female Today's Date: 04/27/2022  PCP: [name] MD  REFERRING PROVIDER: [name] MD    PT End of Session - 04/27/22 1648     Visit Number 24    Number of Visits 27    Date for PT Re-Evaluation 05/26/22    PT Start Time 1645    PT Stop Time 1729    PT Time Calculation (min) 44 min    Activity Tolerance Patient tolerated treatment well    Behavior During Therapy WFL for tasks assessed/performed             Past Medical History:  Diagnosis Date   Allergy    Asthma    COVID-19    History of kidney stones    Hypothyroidism    Migraines    Numbness and tingling    left side of body, occasionally right side    S/P Botox injection    Urticaria    Past Surgical History:  Procedure Laterality Date   ADENOIDECTOMY     ARM NEUROPLASTY Right 1994 and 1996   for chronic pain   CESAREAN SECTION     CYSTOSCOPY W/ RETROGRADES Left 04/23/2020   Procedure: CYSTOSCOPY WITH RETROGRADE PYELOGRAM;  Surgeon: Abbie Sons, MD;  Location: [facility];  Service: Urology;  Laterality: Left;   CYSTOSCOPY W/ RETROGRADES Left 05/13/2020   Procedure: CYSTOSCOPY WITH RETROGRADE PYELOGRAM;  Surgeon: Hollice Espy, MD;  Location: [facility];  Service: Urology;  Laterality: Left;   CYSTOSCOPY WITH STENT PLACEMENT Left 04/23/2020   Procedure: CYSTOSCOPY WITH STENT PLACEMENT;  Surgeon: Abbie Sons, MD;  Location: [facility];  Service: Urology;  Laterality: Left;   CYSTOSCOPY/URETEROSCOPY/HOLMIUM LASER/STENT PLACEMENT Left 05/13/2020   Procedure: CYSTOSCOPY/URETEROSCOPY/LASER/STENT EXCHANGE;  Surgeon: Hollice Espy, MD;  Location: [facility];  Service: Urology;  Laterality: Left;   OVARIAN CYST REMOVAL  1973   STONE EXTRACTION WITH BASKET  05/13/2020   Procedure: STONE EXTRACTION WITH BASKET;  Surgeon: Hollice Espy, MD;  Location: [facility];  Service: Urology;;    [place]   Patient Active Problem List   Diagnosis Date Noted   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 hauler 12/25/2020   Chronic urticaria 12/25/2020   Moderate persistent asthma, uncomplicated 62/56/3893   Ageusia 08/12/2020   Anosmia 08/12/2020   Myalgia, epidemic 08/12/2020   Cough with exposure to COVID-19 virus 08/12/2020   Olfactory impairment 05/29/2020   Acute UTI 04/23/2020   Left ureteral stone 04/23/2020   Intermittent chest pain 02/09/2020   History of COVID-19 02/09/2020   Shortness of breath 02/09/2020   Loss of taste 02/09/2020   Loss of smell 02/09/2020   Rash 02/09/2020   Arthralgia 02/09/2020   Postoperative history of checked last year 04/22/2017   Weakness of left arm 04/22/2017   Numbness and tingling in left arm 04/22/2017   Lower back pain 04/02/2016   Acute stress disorder 02/25/2015   Anxiety 02/25/2015   Airway hyperreactivity 02/25/2015   Bradycardia 02/25/2015   Hypersomnia 02/25/2015   Clinical depression 02/25/2015   Elevated blood sugar 02/25/2015   Fibrositis 02/25/2015   Acid reflux 02/25/2015   Hepatitis non A non B 02/25/2015   H/O disease 02/25/2015   Hypercholesteremia 02/25/2015   Headache, migraine 02/25/2015   Muscle ache 02/25/2015   Allergic rhinitis, seasonal 02/25/2015   Avitaminosis D 02/25/2015  REFERRING DIAG: Migraine  THERAPY DIAG:  Abnormal posture  Nonintractable headache, unspecified chronicity pattern, unspecified headache type  Cervicalgia  Rationale for Evaluation and Treatment Rehabilitation  PERTINENT HISTORY: Patient is a 66 year old female who presents to physical therapy for migraines with specific referral for dry needling.  Patient has been having migraines for about 14-15 years. They have changed from front temporal all day long 2-3/10, a couple years ago started having tingling in left side of body, numbness in arm and leg. Additionally sometimes get's  belly aches.  Changed medication in 2020. Was getting trigger injections but it was too expensive. PMH of HLD, aortic atherosclerosis, asthma, COVID 19, SOB, hypothyroidism, atypical facial pain (2019), neuropathy (2020), fibromyalgia   PRECAUTIONS: n/a  SUBJECTIVE: Patient reports she still does not have a migraine physician now that hers has retired.   PAIN:  Are you having pain? Yes: NPRS scale: 3/10 Pain location: head Pain description: migraine Aggravating factors: stress, tension Relieving factors: heat, rest     TODAY'S TREATMENT:        Treatment Cervical side bend with overpressure to glenohumeral joint 5x 30 seconds Cervical rotation with overpressure to glenohumeral joint: 5x 30 seconds Suboccipital release 4x30 seconds J mobilization for postural kyphosis reduction 4x 30 seconds cervical and thoracic mobilizations grade II for reduced hypomobility x 6 minutes       Trigger Point Dry Needling (TDN), unbilled Education performed with patient regarding potential benefit of TDN. Reviewed precautions and risks with patient. Reviewed special precautions/risks over lung fields which include pneumothorax. Reviewed signs and symptoms of pneumothorax and advised pt to go to ER immediately if these symptoms develop advise them of dry needling treatment. Extensive time spent with pt to ensure full understanding of TDN risks. Pt provided verbal consent to treatment. TDN performed to  with 0.30 x 30 and .2 x 0.3 single needle placements with local twitch response (LTR). Pistoning technique utilized. Improved pain-free motion following intervention. L and Rcervical paraspinals, Upper trap (L and R), temporalis, suboccipital musculature  x 24 minutes       PATIENT EDUCATION: Education details: exercise technique, TDN Person educated: Patient Education method: Explanation, Demonstration, Tactile cues, and Verbal cues Education comprehension: verbalized understanding, returned  demonstration, verbal cues required, and tactile cues required   HOME EXERCISE PROGRAM: See prior sessions    PT Short Term Goals      PT SHORT TERM GOAL #1   Title Pt will be independent with HEP in order to decrease pain in order to improve pain-free function at home and work.    Baseline 6/7: HEP given 9/1: HEp compliant 11/15: HEP compliant    Time 6    Period Weeks    Status Achieved    Target Date 08/07/21              PT Long Term Goals -      PT LONG TERM GOAL #1   Title Patient will increase FOTO score to equal to or greater than 59%    to demonstrate statistically significant improvement in mobility and quality of life.    Baseline 6/7: 50% 9/1: 59% 11/15: 86% 2/14: 71%    Time 12    Period Weeks    Status Achieved      PT LONG TERM GOAL #2   Title Pt will decrease worst pain as reported on NPRS by at least 3 points (3/10) in order to demonstrate clinically significant reduction in pain.    Baseline  6/7: 6/10 9/1: 4/10 11/15: 5/10 2/14: 3-4/10 4/25: 6/10    Time 12    Period Weeks    Status Partially Met    Target Date 05/26/22      PT LONG TERM GOAL #3   Title Pt will demonstrate pain-free full range cervical motion in order to perform IADLs such as driving and household chores without increase in symptoms.    Baseline 6/7: see note for limitation 9/1:flexion 54, extension 60, SB R 35 L 30, rotation R 51 L 62 11/15: flexion 58, extension 60, SB R and L 35, R rotation 59 L rotation 60 2/14: flexion 70, extension 60 SB R and L 38, R rotation 59 L 64 4/25: see note    Time 12    Period Weeks    Status Partially Met    Target Date 05/26/22      PT LONG TERM GOAL #4   Title Patient will have less than two days a week of high pain migraines and demonstrate ability to self control/contain migraines.    Baseline 9/1:every day getting the mild ones, 2x/week for high pain migraines. 11/15: two days of high pain in past month, has mini headaches every day 2/14: has  worsening headaches in the evening but only 2-3 days of high pain 4/25: 2 days of high pain    Time 12    Period Weeks    Status Partially Met    Target Date 05/26/22      PT LONG TERM GOAL #5   Title Patient will reduce Neck Disability Index score to <10% to demonstrate minimal disability with ADL's including improved sleeping tolerance, sitting tolerance, etc for better mobility at home and work.    Baseline 2/14: 18% 4/25: 16%    Time 12    Period Weeks    Status Partially Met    Target Date 05/26/22              Plan     Clinical Impression Statement Patient presents with excellent motivation. She has significant trigger points of bilateral upper traps. Suboccipital release and TDN reduced migraine/headache as well as improved tension. She will benefit from skilled physical therapy to reduce pain, improve patient independence with symptom management, and improve quality of life    Personal Factors and Comorbidities Age;Behavior Pattern;Comorbidity 3+;Fitness;Past/Current Experience;Profession;Time since onset of injury/illness/exacerbation    Comorbidities HLD, aortic atherosclerosis, asthma, COVID 19, SOB, hypothyroidism, atypical facial pain (2019), neuropathy (2020), fibromyalgia    Examination-Activity Limitations Caring for Others;Carry;Lift;Sleep;Transfers    Examination-Participation Restrictions Cleaning;Community Activity;Interpersonal Relationship;Driving;Laundry;Shop;Occupation;Meal Prep;Yard Work    [employer] Evolving/Moderate complexity    Rehab Potential Fair    PT Frequency Biweekly    PT Duration 12 weeks    PT Treatment/Interventions ADLs/Self Care Home Management;Dry needling;Manual techniques;Therapeutic exercise;Therapeutic activities;Neuromuscular re-education;Aquatic Therapy;Biofeedback;Canalith Repostioning;Cryotherapy;Electrical Stimulation;Iontophoresis 57m/ml Dexamethasone;Moist Heat;Ultrasound;Patient/family education;Passive  range of motion;Energy conservation;Vestibular;Joint Manipulations;Traction;Gait training;Balance training;Taping;Spinal Manipulations;Visual/perceptual remediation/compensation    PT Next Visit Plan exercise technique, body mechanics    Consulted and Agree with Plan of Care Patient              MJanna Arch PT 04/27/2022, 7:46 PM

## 2022-04-29 ENCOUNTER — Ambulatory Visit (INDEPENDENT_AMBULATORY_CARE_PROVIDER_SITE_OTHER): Payer: 59

## 2022-04-29 DIAGNOSIS — L501 Idiopathic urticaria: Secondary | ICD-10-CM

## 2022-05-01 ENCOUNTER — Other Ambulatory Visit (HOSPITAL_COMMUNITY): Payer: Self-pay

## 2022-05-04 ENCOUNTER — Other Ambulatory Visit (HOSPITAL_COMMUNITY): Payer: Self-pay

## 2022-05-07 ENCOUNTER — Ambulatory Visit: Payer: 59

## 2022-05-12 ENCOUNTER — Other Ambulatory Visit: Payer: Self-pay | Admitting: Allergy & Immunology

## 2022-05-12 ENCOUNTER — Ambulatory Visit: Payer: 59

## 2022-05-12 ENCOUNTER — Ambulatory Visit (INDEPENDENT_AMBULATORY_CARE_PROVIDER_SITE_OTHER): Payer: 59 | Admitting: Allergy & Immunology

## 2022-05-12 ENCOUNTER — Other Ambulatory Visit (HOSPITAL_COMMUNITY): Payer: Self-pay

## 2022-05-12 VITALS — BP 150/90 | HR 88 | Temp 97.6°F | Resp 16 | Wt 162.2 lb

## 2022-05-12 DIAGNOSIS — U099 Post covid-19 condition, unspecified: Secondary | ICD-10-CM

## 2022-05-12 DIAGNOSIS — M797 Fibromyalgia: Secondary | ICD-10-CM | POA: Insufficient documentation

## 2022-05-12 DIAGNOSIS — L501 Idiopathic urticaria: Secondary | ICD-10-CM

## 2022-05-12 DIAGNOSIS — J45909 Unspecified asthma, uncomplicated: Secondary | ICD-10-CM | POA: Insufficient documentation

## 2022-05-12 DIAGNOSIS — R293 Abnormal posture: Secondary | ICD-10-CM

## 2022-05-12 DIAGNOSIS — R519 Headache, unspecified: Secondary | ICD-10-CM | POA: Diagnosis not present

## 2022-05-12 DIAGNOSIS — J4540 Moderate persistent asthma, uncomplicated: Secondary | ICD-10-CM

## 2022-05-12 DIAGNOSIS — M542 Cervicalgia: Secondary | ICD-10-CM | POA: Diagnosis not present

## 2022-05-12 DIAGNOSIS — E785 Hyperlipidemia, unspecified: Secondary | ICD-10-CM | POA: Insufficient documentation

## 2022-05-12 DIAGNOSIS — H04123 Dry eye syndrome of bilateral lacrimal glands: Secondary | ICD-10-CM | POA: Insufficient documentation

## 2022-05-12 NOTE — Progress Notes (Signed)
FOLLOW UP  Date of Service/Encounter:  05/12/22   Assessment:   Post COVID syndrome   Chronic idiopathic urticaria - improved by not fully resolved on Xolair every 28 days   Moderate persistent asthma, uncomplicated - present before COVID19 infection (patient will be getting [place] prior IgE levels before starting Xolair to see if she would qualify for it from an asthma perspective)   Plan/Recommendations:    There are no Patient Instructions on file for this visit.   Subjective:   Rachel Fry is a 66 y.o. female presenting today for follow up of No chief complaint on file.   Rachel Fry has a history of the following: Patient Active Problem List   Diagnosis Date Noted  . 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 hauler 12/25/2020  . Chronic urticaria 12/25/2020  . Moderate persistent asthma, uncomplicated 58/52/7782  . Ageusia 08/12/2020  . 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 Myalgia, epidemic 08/12/2020  . Cough with exposure to COVID-19 virus 08/12/2020  . Olfactory impairment 05/29/2020  . Acute UTI 04/23/2020  . Left ureteral stone 04/23/2020  . Intermittent chest pain 02/09/2020  . History of COVID-19 02/09/2020  . Shortness of breath 02/09/2020  . Loss of taste 02/09/2020  . Loss of smell 02/09/2020  . Rash 02/09/2020  . Arthralgia 02/09/2020  . Postoperative history of checked last year 04/22/2017  . Weakness of left arm 04/22/2017  . Numbness and tingling in left arm 04/22/2017  . Lower back pain 04/02/2016  . Acute stress disorder 02/25/2015  . Anxiety 02/25/2015  . Airway hyperreactivity 02/25/2015  . Bradycardia 02/25/2015  . Hypersomnia 02/25/2015  . Clinical depression 02/25/2015  . Elevated blood sugar 02/25/2015  . 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 Acid reflux 02/25/2015  . 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 H/O disease 02/25/2015  . Hypercholesteremia 02/25/2015  . Headache, migraine 02/25/2015  . Muscle ache 02/25/2015  . Allergic rhinitis, seasonal 02/25/2015  . Avitaminosis D  02/25/2015    History obtained from: chart review and patient.  Rachel Fry is a 66 y.o. female presenting for {Blank single:19197::"a food challenge","a drug challenge","skin testing","a sick visit","an evaluation of ***","a follow up visit"}.  She was last seen in September 2022.  At that time, we continue with Xolair but worked on getting it approved for every 2 weeks.  We gave her a prednisone pack to have on hand because she was traveling to [place].  We continue with cetirizine 10 mg at night.  For her asthma, her lung testing looks slightly worse.  We have on Spiriva to see if that would help at all.  We continue with Flovent 110 mcg 2 puffs twice daily as well as Accolate twice daily.  Since last visit, she has done well for the most part.   Asthma/Respiratory Symptom History: She remains on her Flovent two puffs BID. She was using her rescue inhaler frequently last year due to SOB and walking long distances. It will clear up, but she is nusure whether this is related to her just sitting down or if it is related to the use of the albuterol. Nothing shows lung wise.  She does report a nagging cough that is worse when she is physically active. But this is a somewhat tight cough. It is not as if she is having an asthma exacerbation. She has not needed prednisone for her breathing at all. She could not tolerate the Spiriva with the hoarsness. This was similar for [employer] and Symbicort. She used the Aerochamber.   Allergic Rhinitis  Symptom History: She avoids the outdoors when pollen is increased. But she does have some rhinorrhea with exposure to cold air. Overall she is doing fairly well. She no longer has a pet in the home.  {Blank single:19197::"Food Allergy Symptom History: ***"," "}  Skin Symptom History: The rash is better than it was. Around pone week before hte injection, she becomes pruritic. She is not brekzing out in a rash or hives.   {Blank single:19197::"GERD Symptom History: ***","  "}  Otherwise, there have been no changes to her past medical history, surgical history, family history, or social history.    ROS     Objective:   There were no vitals taken for this visit. There is no height or weight on file to calculate BMI.    Physical Exam   Diagnostic studies: {Blank single:19197::"none","deferred due to recent antihistamine use","labs sent instead"," "}  Spirometry: {Blank single:19197::"results normal (FEV1: ***%, FVC: ***%, FEV1/FVC: ***%)","results abnormal (FEV1: ***%, FVC: ***%, FEV1/FVC: ***%)"}.    {Blank single:19197::"Spirometry consistent with mild obstructive disease","Spirometry consistent with moderate obstructive disease","Spirometry consistent with severe obstructive disease","Spirometry consistent with possible restrictive disease","Spirometry consistent with mixed obstructive and restrictive disease","Spirometry uninterpretable due to technique","Spirometry consistent with normal pattern"}. {Blank single:19197::"Albuterol/Atrovent nebulizer","Xopenex/Atrovent nebulizer","Albuterol nebulizer","Albuterol four puffs via MDI","Xopenex four puffs via MDI"} treatment given in clinic with {Blank single:19197::"significant improvement in FEV1 per ATS criteria","significant improvement in FVC per ATS criteria","significant improvement in FEV1 and FVC per ATS criteria","improvement in FEV1, but not significant per ATS criteria","improvement in FVC, but not significant per ATS criteria","improvement in FEV1 and FVC, but not significant per ATS criteria","no improvement"}.  Allergy Studies: {Blank single:19197::"none","labs sent instead"," "}    {Blank single:19197::"Allergy testing results were read and interpreted by myself, documented by clinical staff."," "}      Salvatore Marvel, MD  Allergy and [place] [facility]

## 2022-05-12 NOTE — Therapy (Signed)
OUTPATIENT PHYSICAL THERAPY TREATMENT NOTE   Patient Name: Rachel Fry MRN: [id number] DOB:[birth year], 66 y.o., female Today's Date: 05/12/2022  PCP: [name] MD  REFERRING PROVIDER: [name] MD    PT End of Session - 05/12/22 1016     Visit Number 25    Number of Visits 27    Date for PT Re-Evaluation 05/26/22    PT Start Time 8416    PT Stop Time 1059    PT Time Calculation (min) 44 min    Activity Tolerance Patient tolerated treatment well    Behavior During Therapy WFL for tasks assessed/performed              Past Medical History:  Diagnosis Date   Allergy    Asthma    COVID-19    History of kidney stones    Hypothyroidism    Migraines    Numbness and tingling    left side of body, occasionally right side    S/P Botox injection    Urticaria    Past Surgical History:  Procedure Laterality Date   ADENOIDECTOMY     ARM NEUROPLASTY Right 1994 and 1996   for chronic pain   CESAREAN SECTION     CYSTOSCOPY W/ RETROGRADES Left 04/23/2020   Procedure: CYSTOSCOPY WITH RETROGRADE PYELOGRAM;  Surgeon: Abbie Sons, MD;  Location: [facility];  Service: Urology;  Laterality: Left;   CYSTOSCOPY W/ RETROGRADES Left 05/13/2020   Procedure: CYSTOSCOPY WITH RETROGRADE PYELOGRAM;  Surgeon: Hollice Espy, MD;  Location: [facility];  Service: Urology;  Laterality: Left;   CYSTOSCOPY WITH STENT PLACEMENT Left 04/23/2020   Procedure: CYSTOSCOPY WITH STENT PLACEMENT;  Surgeon: Abbie Sons, MD;  Location: [facility];  Service: Urology;  Laterality: Left;   CYSTOSCOPY/URETEROSCOPY/HOLMIUM LASER/STENT PLACEMENT Left 05/13/2020   Procedure: CYSTOSCOPY/URETEROSCOPY/LASER/STENT EXCHANGE;  Surgeon: Hollice Espy, MD;  Location: [facility];  Service: Urology;  Laterality: Left;   OVARIAN CYST REMOVAL  1973   STONE EXTRACTION WITH BASKET  05/13/2020   Procedure: STONE EXTRACTION WITH BASKET;  Surgeon: Hollice Espy, MD;  Location: [facility];  Service: Urology;;    [place]   Patient Active Problem List   Diagnosis Date Noted   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 hauler 12/25/2020   Chronic urticaria 12/25/2020   Moderate persistent asthma, uncomplicated 60/63/0160   Ageusia 08/12/2020   Anosmia 08/12/2020   Myalgia, epidemic 08/12/2020   Cough with exposure to COVID-19 virus 08/12/2020   Olfactory impairment 05/29/2020   Acute UTI 04/23/2020   Left ureteral stone 04/23/2020   Intermittent chest pain 02/09/2020   History of COVID-19 02/09/2020   Shortness of breath 02/09/2020   Loss of taste 02/09/2020   Loss of smell 02/09/2020   Rash 02/09/2020   Arthralgia 02/09/2020   Postoperative history of checked last year 04/22/2017   Weakness of left arm 04/22/2017   Numbness and tingling in left arm 04/22/2017   Lower back pain 04/02/2016   Acute stress disorder 02/25/2015   Anxiety 02/25/2015   Airway hyperreactivity 02/25/2015   Bradycardia 02/25/2015   Hypersomnia 02/25/2015   Clinical depression 02/25/2015   Elevated blood sugar 02/25/2015   Fibrositis 02/25/2015   Acid reflux 02/25/2015   Hepatitis non A non B 02/25/2015   H/O disease 02/25/2015   Hypercholesteremia 02/25/2015   Headache, migraine 02/25/2015   Muscle ache 02/25/2015   Allergic rhinitis, seasonal 02/25/2015   Avitaminosis D 02/25/2015  REFERRING DIAG: Migraine  THERAPY DIAG:  Abnormal posture  Nonintractable headache, unspecified chronicity pattern, unspecified headache type  Cervicalgia  Rationale for Evaluation and Treatment Rehabilitation  PERTINENT HISTORY: Patient is a 65 year old female who presents to physical therapy for migraines with specific referral for dry needling.  Patient has been having migraines for about 14-15 years. They have changed from front temporal all day long 2-3/10, a couple years ago started having tingling in left side of body, numbness in arm and leg. Additionally sometimes get's  belly aches.  Changed medication in 2020. Was getting trigger injections but it was too expensive. PMH of HLD, aortic atherosclerosis, asthma, COVID 19, SOB, hypothyroidism, atypical facial pain (2019), neuropathy (2020), fibromyalgia   PRECAUTIONS: n/a  SUBJECTIVE: Patient reports she has had a migraine since last Wednesday.   PAIN:  Are you having pain? Yes: NPRS scale: 3/10 Pain location: head Pain description: migraine Aggravating factors: stress, tension Relieving factors: heat, rest     TODAY'S TREATMENT:        Treatment Cervical side bend with overpressure to glenohumeral joint 5x 30 seconds Cervical rotation with overpressure to glenohumeral joint: 5x 30 seconds Suboccipital release 4x30 seconds J mobilization for postural kyphosis reduction 4x 30 seconds cervical and thoracic mobilizations grade II for reduced hypomobility x 6 minutes       Trigger Point Dry Needling (TDN), unbilled Education performed with patient regarding potential benefit of TDN. Reviewed precautions and risks with patient. Reviewed special precautions/risks over lung fields which include pneumothorax. Reviewed signs and symptoms of pneumothorax and advised pt to go to ER immediately if these symptoms develop advise them of dry needling treatment. Extensive time spent with pt to ensure full understanding of TDN risks. Pt provided verbal consent to treatment. TDN performed to  with 0.30 x 30 and .2 x 0.3 single needle placements with local twitch response (LTR). Pistoning technique utilized. Improved pain-free motion following intervention. L and Rcervical paraspinals, Upper trap (L and R), temporalis, suboccipital musculature  x  24 minutes       PATIENT EDUCATION: Education details: exercise technique, TDN Person educated: Patient Education method: Explanation, Demonstration, Tactile cues, and Verbal cues Education comprehension: verbalized understanding, returned demonstration, verbal cues  required, and tactile cues required   HOME EXERCISE PROGRAM: See prior sessions    PT Short Term Goals      PT SHORT TERM GOAL #1   Title Pt will be independent with HEP in order to decrease pain in order to improve pain-free function at home and work.    Baseline 6/7: HEP given 9/1: HEp compliant 11/15: HEP compliant    Time 6    Period Weeks    Status Achieved    Target Date 08/07/21              PT Long Term Goals -      PT LONG TERM GOAL #1   Title Patient will increase FOTO score to equal to or greater than 59%    to demonstrate statistically significant improvement in mobility and quality of life.    Baseline 6/7: 50% 9/1: 59% 11/15: 86% 2/14: 71%    Time 12    Period Weeks    Status Achieved      PT LONG TERM GOAL #2   Title Pt will decrease worst pain as reported on NPRS by at least 3 points (3/10) in order to demonstrate clinically significant reduction in pain.    Baseline 6/7: 6/10 9/1: 4/10  11/15: 5/10 2/14: 3-4/10 4/25: 6/10    Time 12    Period Weeks    Status Partially Met    Target Date 05/26/22      PT LONG TERM GOAL #3   Title Pt will demonstrate pain-free full range cervical motion in order to perform IADLs such as driving and household chores without increase in symptoms.    Baseline 6/7: see note for limitation 9/1:flexion 54, extension 60, SB R 35 L 30, rotation R 51 L 62 11/15: flexion 58, extension 60, SB R and L 35, R rotation 59 L rotation 60 2/14: flexion 70, extension 60 SB R and L 38, R rotation 59 L 64 4/25: see note    Time 12    Period Weeks    Status Partially Met    Target Date 05/26/22      PT LONG TERM GOAL #4   Title Patient will have less than two days a week of high pain migraines and demonstrate ability to self control/contain migraines.    Baseline 9/1:every day getting the mild ones, 2x/week for high pain migraines. 11/15: two days of high pain in past month, has mini headaches every day 2/14: has worsening headaches in the  evening but only 2-3 days of high pain 4/25: 2 days of high pain    Time 12    Period Weeks    Status Partially Met    Target Date 05/26/22      PT LONG TERM GOAL #5   Title Patient will reduce Neck Disability Index score to <10% to demonstrate minimal disability with ADL's including improved sleeping tolerance, sitting tolerance, etc for better mobility at home and work.    Baseline 2/14: 18% 4/25: 16%    Time 12    Period Weeks    Status Partially Met    Target Date 05/26/22              Plan     Clinical Impression Statement Patient has significant increased tension of bilateral upper traps with migraine present for past week. Patient tolerated extensive TDN well with no pain increase. She will benefit from skilled physical therapy to reduce pain, improve patient independence with symptom management, and improve quality of life    Personal Factors and Comorbidities Age;Behavior Pattern;Comorbidity 3+;Fitness;Past/Current Experience;Profession;Time since onset of injury/illness/exacerbation    Comorbidities HLD, aortic atherosclerosis, asthma, COVID 19, SOB, hypothyroidism, atypical facial pain (2019), neuropathy (2020), fibromyalgia    Examination-Activity Limitations Caring for Others;Carry;Lift;Sleep;Transfers    Examination-Participation Restrictions Cleaning;Community Activity;Interpersonal Relationship;Driving;Laundry;Shop;Occupation;Meal Prep;Yard Work    [employer] Evolving/Moderate complexity    Rehab Potential Fair    PT Frequency Biweekly    PT Duration 12 weeks    PT Treatment/Interventions ADLs/Self Care Home Management;Dry needling;Manual techniques;Therapeutic exercise;Therapeutic activities;Neuromuscular re-education;Aquatic Therapy;Biofeedback;Canalith Repostioning;Cryotherapy;Electrical Stimulation;Iontophoresis 27m/ml Dexamethasone;Moist Heat;Ultrasound;Patient/family education;Passive range of motion;Energy conservation;Vestibular;Joint  Manipulations;Traction;Gait training;Balance training;Taping;Spinal Manipulations;Visual/perceptual remediation/compensation    PT Next Visit Plan exercise technique, body mechanics    Consulted and Agree with Plan of Care Patient              MJanna Arch PT 05/12/2022, 11:27 AM

## 2022-05-12 NOTE — Patient Instructions (Addendum)
1. Post COVID syndrome with urticarial rash - Continue with Xolair but we will work on getting this approved every two weeks.  - Continue with cetirizine '10mg'$  at night (you can add on a morning dose if you want to see if that helps).   2. Moderate persistent asthma, uncomplicated - Lung testing not done. - We are going to work on getting Xolair approved for asthma to help control your breathing symptoms more effectively. - Daily controller medication(s): Flovent 152mg 2 puffs twice daily with spacer and Accolate twice daily - Prior to physical activity: albuterol 2 puffs 10-15 minutes before physical activity. - Rescue medications: albuterol 4 puffs every 4-6 hours as needed - Changes during respiratory infections or worsening symptoms: Increase Flovent 1175m to 4 puffs twice daily for TWO WEEKS. - Asthma control goals:  * Full participation in all desired activities (may need albuterol before activity) * Albuterol use two time or less a week on average (not counting use with activity) * Cough interfering with sleep two time or less a month * Oral steroids no more than once a year * No hospitalizations  3. Return in about 6 months (around 11/12/2022).    Please inform [facility] any Emergency Department visits, hospitalizations, or changes in symptoms. Call usKoreaefore going to the ED for breathing or allergy symptoms since we might be able to fit you in for a sick visit. Feel free to contact usKoreanytime with any questions, problems, or concerns.  It was a pleasure to see you again today!  Websites that have reliable patient information: 1. American Academy of Asthma, Allergy, and Immunology: www.aaaai.org 2. Food Allergy Research and Education (FARE): foodallergy.org 3. Mothers of Asthmatics: http://www.asthmacommunitynetwork.org 4. American College of Allergy, Asthma, and Immunology: www.acaai.org   COVID-19 Vaccine Information can be found at:  [email or web address] questions related to vaccine distribution or appointments, please email [email or web address] or call [phone]  We realize that you might be concerned about having an allergic reaction to the COVID19 vaccines. To help with that concern, WE ARE OFFERING THE COVID19 VACCINES IN OUR OFFICE! Ask the front desk for dates!     "Like" [facility] Facebook and Instagram for our latest updates!      A healthy democracy works best when ALNew York Life Insurancearticipate! Make sure you are registered to vote! If you have moved or changed any of your contact information, you will need to get this updated before voting!  In some cases, you MAY be able to register to vote online: [email or web address]

## 2022-05-13 ENCOUNTER — Encounter: Payer: Self-pay | Admitting: Allergy & Immunology

## 2022-05-13 ENCOUNTER — Other Ambulatory Visit (HOSPITAL_COMMUNITY): Payer: Self-pay

## 2022-05-13 MED ORDER — FLUTICASONE PROPIONATE HFA 110 MCG/ACT IN AERO
2.0000 | INHALATION_SPRAY | Freq: Two times a day (BID) | RESPIRATORY_TRACT | 0 refills | Status: DC
  Filled 2022-05-13: qty 12, 30d supply, fill #0

## 2022-05-17 ENCOUNTER — Other Ambulatory Visit: Payer: Self-pay | Admitting: Allergy & Immunology

## 2022-05-18 ENCOUNTER — Other Ambulatory Visit (HOSPITAL_COMMUNITY): Payer: Self-pay

## 2022-05-18 MED ORDER — ZAFIRLUKAST 10 MG PO TABS
ORAL_TABLET | ORAL | 3 refills | Status: DC
Start: 2022-05-18 — End: 2022-11-17
  Filled 2022-05-18: qty 180, 90d supply, fill #0
  Filled 2022-08-23: qty 180, 90d supply, fill #1
  Filled 2022-11-15: qty 180, 90d supply, fill #2

## 2022-05-19 ENCOUNTER — Other Ambulatory Visit (HOSPITAL_COMMUNITY): Payer: Self-pay

## 2022-05-19 ENCOUNTER — Ambulatory Visit: Payer: 59

## 2022-05-20 ENCOUNTER — Other Ambulatory Visit (HOSPITAL_COMMUNITY): Payer: Self-pay

## 2022-05-26 ENCOUNTER — Other Ambulatory Visit (HOSPITAL_COMMUNITY): Payer: Self-pay

## 2022-05-26 ENCOUNTER — Ambulatory Visit (INDEPENDENT_AMBULATORY_CARE_PROVIDER_SITE_OTHER): Payer: 59

## 2022-05-26 DIAGNOSIS — L501 Idiopathic urticaria: Secondary | ICD-10-CM

## 2022-05-28 ENCOUNTER — Other Ambulatory Visit (HOSPITAL_COMMUNITY): Payer: Self-pay

## 2022-05-28 ENCOUNTER — Telehealth: Payer: Self-pay | Admitting: *Deleted

## 2022-05-28 ENCOUNTER — Ambulatory Visit: Payer: 59 | Attending: Internal Medicine | Admitting: Pharmacist

## 2022-05-28 DIAGNOSIS — Z79899 Other long term (current) drug therapy: Secondary | ICD-10-CM

## 2022-05-28 MED ORDER — OMALIZUMAB 75 MG/0.5ML ~~LOC~~ SOSY
375.0000 mg | PREFILLED_SYRINGE | SUBCUTANEOUS | 11 refills | Status: DC
  Filled 2022-05-28: qty 1, 14d supply, fill #0

## 2022-05-28 MED ORDER — OMALIZUMAB 75 MG/0.5ML ~~LOC~~ SOSY
375.0000 mg | PREFILLED_SYRINGE | SUBCUTANEOUS | 11 refills | Status: DC
  Filled 2022-05-28: qty 1, 14d supply, fill #0
  Filled 2022-06-19: qty 1, 28d supply, fill #0
  Filled 2022-07-10: qty 1, 28d supply, fill #1
  Filled 2022-08-10: qty 1, 28d supply, fill #2

## 2022-05-28 MED ORDER — OMALIZUMAB 150 MG/ML ~~LOC~~ SOSY
375.0000 mg | PREFILLED_SYRINGE | SUBCUTANEOUS | 11 refills | Status: DC
  Filled 2022-05-28: qty 4, 14d supply, fill #0
  Filled 2022-06-12: qty 4, 28d supply, fill #0
  Filled 2022-07-10: qty 4, 28d supply, fill #1
  Filled 2022-08-10: qty 4, 28d supply, fill #2

## 2022-05-28 MED ORDER — XOLAIR 150 MG ~~LOC~~ SOLR
375.0000 mg | SUBCUTANEOUS | 11 refills | Status: DC
  Filled 2022-05-28: qty 4, 14d supply, fill #0

## 2022-05-28 NOTE — Telephone Encounter (Signed)
L/m for patient to contact me to advise approvals for Xolair doses to [place] and increase to '375mg'$  every 14 days

## 2022-05-28 NOTE — Progress Notes (Signed)
   S: Patient presents for review of their specialty medication therapy.  Patient is currently taking Xolair for urticaria/asthma. Patient is managed by Dr. Ernst Bowler for this.   Adherence: confirmed  Efficacy: reports that, although not at 100%, the medication appears to be working  Dosing: Give subcutaneously.    Asthma: Chronic idiopathic urticaria: SubQ: 150 or 300 mg every 4 weeks. Dosing is not dependent on serum IgE (free or total) level or body weight.  Dose adjustments: Renal: no dose adjustments  Hepatic: no dose adjustments  Toxicity: Severe hypersensitivity reaction or anaphylaxis: Discontinue treatment. Fever, arthralgia, and rash: Discontinue treatment if this constellation of symptoms occurs.  Drug-drug interactions: no interactions identified   Monitoring: CV effects: none Eosinophilia and vasculitis: none Fever/arthralgia/rash: none Hypersensitivity/Anaphylaxis: none Malignant neoplasms: none  O:  Lab Results  Component Value Date   WBC 10.3 08/16/2020   HGB 14.1 08/16/2020   HCT 43.1 08/16/2020   MCV 94.2 08/16/2020   PLT 235.0 08/16/2020      Chemistry      Component Value Date/Time   NA 135 12/25/2021 1658   NA 146 (H) 02/09/2020 1019   NA 139 08/29/2014 1112   K 3.9 12/25/2021 1658   K 4.3 08/29/2014 1112   CL 97 (L) 12/25/2021 1658   CL 103 08/29/2014 1112   CO2 28 12/25/2021 1658   CO2 33 (H) 08/29/2014 1112   BUN 16 12/25/2021 1658   BUN 11 02/09/2020 1019   BUN 17 08/29/2014 1112   CREATININE 1.02 (H) 12/25/2021 1658   CREATININE 0.98 08/29/2014 1112   GLU 83 03/30/2014 0000      Component Value Date/Time   CALCIUM 11.1 (H) 12/25/2021 1658   CALCIUM 9.3 08/29/2014 1112   ALKPHOS 74 12/25/2021 1658   ALKPHOS 69 08/29/2014 1112   AST 49 (H) 12/25/2021 1658   AST 19 08/29/2014 1112   ALT 58 (H) 12/25/2021 1658   ALT 23 08/29/2014 1112   BILITOT 0.6 12/25/2021 1658   BILITOT 0.4 02/09/2020 1019   BILITOT 0.3 08/29/2014 1112        A/P: 1. Medication review: patient currently on Xolair for urticaria and is tolerating it well. Reviewed the medication with the patient, including the following: Xolair, omalizumab, is a novel IgE blocker.  It appears to reduce rates of hospitalizations, ER visits and unscheduled physician visits due asthma exacerbations when added to standard therapy.  Studies also show a reduction in steroid requirements and improvement in quality of life.  Patient educated on purpose, proper use and potential adverse effects of Xolair.  Following instruction patient verbalized understanding. Patient should always have an EpiPen readily available in the event of anaphylaxis. SubQ: For SubQ injection only; doses >150 mg should be divided over more than one injection site (eg, 225 mg or 300 mg administered as two injections, 375 mg administered as three injections); each injection site should be separated by ?1 inch. Do not inject into moles, scars, bruises, tender areas, or broken skin. Injections may take 5 to 10 seconds to administer (solution is slightly viscous). Administer only under direct medical supervision and observe patient for 2 hours after the first 3 injections and 30 minutes after subsequent injections Dellia Cloud 2015) or in accordance with individual institution policies and procedures.No recommendations for any changes at this time.   Benard Halsted, PharmD, [facility] [place], [place] [phone]

## 2022-05-28 NOTE — Telephone Encounter (Signed)
-----   Message from Valentina Shaggy, MD sent at 05/13/2022  5:42 AM EDT ----- Can we submit for Xolair for ASTHMA? IgE was around 1500 in October 2021.

## 2022-05-29 ENCOUNTER — Other Ambulatory Visit (HOSPITAL_COMMUNITY): Payer: Self-pay

## 2022-06-01 ENCOUNTER — Other Ambulatory Visit (HOSPITAL_COMMUNITY): Payer: Self-pay

## 2022-06-01 DIAGNOSIS — G43709 Chronic migraine without aura, not intractable, without status migrainosus: Secondary | ICD-10-CM | POA: Diagnosis not present

## 2022-06-01 NOTE — Therapy (Signed)
OUTPATIENT PHYSICAL THERAPY TREATMENT NOTE/RECERT   Patient Name: Rachel Fry MRN: [id number] DOB:July 17, 1956, 66 y.o., female Today's Date: 06/02/2022  PCP: [name] MD  REFERRING PROVIDER: [name] MD    PT End of Session - 06/02/22 0720     Visit Number 26    Number of Visits 32    Date for PT Re-Evaluation 08/25/22    PT Start Time 0715    PT Stop Time 0759    PT Time Calculation (min) 44 min    Activity Tolerance Patient tolerated treatment well    Behavior During Therapy WFL for tasks assessed/performed               Past Medical History:  Diagnosis Date   Allergy    Asthma    COVID-19    History of kidney stones    Hypothyroidism    Migraines    Numbness and tingling    left side of body, occasionally right side    S/P Botox injection    Urticaria    Past Surgical History:  Procedure Laterality Date   ADENOIDECTOMY     ARM NEUROPLASTY Right 1994 and 1996   for chronic pain   CESAREAN SECTION     CYSTOSCOPY W/ RETROGRADES Left 04/23/2020   Procedure: CYSTOSCOPY WITH RETROGRADE PYELOGRAM;  Surgeon: Abbie Sons, MD;  Location: [facility];  Service: Urology;  Laterality: Left;   CYSTOSCOPY W/ RETROGRADES Left 05/13/2020   Procedure: CYSTOSCOPY WITH RETROGRADE PYELOGRAM;  Surgeon: Hollice Espy, MD;  Location: [facility];  Service: Urology;  Laterality: Left;   CYSTOSCOPY WITH STENT PLACEMENT Left 04/23/2020   Procedure: CYSTOSCOPY WITH STENT PLACEMENT;  Surgeon: Abbie Sons, MD;  Location: [facility];  Service: Urology;  Laterality: Left;   CYSTOSCOPY/URETEROSCOPY/HOLMIUM LASER/STENT PLACEMENT Left 05/13/2020   Procedure: CYSTOSCOPY/URETEROSCOPY/LASER/STENT EXCHANGE;  Surgeon: Hollice Espy, MD;  Location: [facility];  Service: Urology;  Laterality: Left;   OVARIAN CYST REMOVAL  1973   STONE EXTRACTION WITH BASKET  05/13/2020   Procedure: STONE EXTRACTION WITH BASKET;  Surgeon: Hollice Espy, MD;  Location: [facility];  Service:  Urology;;   [place]   Patient Active Problem List   Diagnosis Date Noted   Dry eyes 05/12/2022   Asthma 05/12/2022   Fibromyalgia 05/12/2022   Hyperlipidemia 05/12/2022   Osteoporosis 03/13/2021   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 hauler 12/25/2020   Chronic urticaria 12/25/2020   Moderate persistent asthma, uncomplicated 84/69/6295   Ageusia 08/12/2020   Anosmia 08/12/2020   Myalgia, epidemic 08/12/2020   Cough with exposure to COVID-19 virus 08/12/2020   Olfactory impairment 05/29/2020   Acute UTI 04/23/2020   Left ureteral stone 04/23/2020   Intermittent chest pain 02/09/2020   History of COVID-19 02/09/2020   Shortness of breath 02/09/2020   Loss of taste 02/09/2020   Loss of smell 02/09/2020   Rash 02/09/2020   Arthralgia 02/09/2020   Benign essential hypertension 10/25/2019   Gastroesophageal reflux disease without esophagitis 06/23/2019   Chronic migraine 06/23/2019   Neuropathy 11/17/2018   Atypical facial pain 06/30/2018   Hypothyroidism 05/19/2018   Postoperative history of checked last year 04/22/2017   Weakness of left arm 04/22/2017   Numbness and tingling in left arm 04/22/2017   Lower back pain 04/02/2016   Acute stress disorder 02/25/2015   Anxiety 02/25/2015   Airway hyperreactivity 02/25/2015   Bradycardia 02/25/2015   Hypersomnia 02/25/2015   Clinical depression 02/25/2015  Elevated blood sugar 02/25/2015   Fibrositis 02/25/2015   Acid reflux 02/25/2015   Hepatitis non A non B 02/25/2015   H/O disease 02/25/2015   Hypercholesteremia 02/25/2015   Headache, migraine 02/25/2015   Muscle ache 02/25/2015   Allergic rhinitis, seasonal 02/25/2015   Avitaminosis D 02/25/2015    REFERRING DIAG: Migraine  THERAPY DIAG:  Abnormal posture - Plan: PT plan of care cert/re-cert  Nonintractable headache, unspecified chronicity pattern, unspecified headache type - Plan: PT plan of care  cert/re-cert  Cervicalgia - Plan: PT plan of care cert/re-cert  Rationale for Evaluation and Treatment Rehabilitation  PERTINENT HISTORY: Patient is a 66 year old female who presents to physical therapy for migraines with specific referral for dry needling.  Patient has been having migraines for about 14-15 years. They have changed from front temporal all day long 2-3/10, a couple years ago started having tingling in left side of body, numbness in arm and leg. Additionally sometimes get's belly aches.  Changed medication in 2020. Was getting trigger injections but it was too expensive. PMH of HLD, aortic atherosclerosis, asthma, COVID 19, SOB, hypothyroidism, atypical facial pain (2019), neuropathy (2020), fibromyalgia   PRECAUTIONS: n/a  SUBJECTIVE: Patient reports she had a flare up since last session due to increase demands at work.   PAIN:  Are you having pain? Yes: NPRS scale: 3/10 Pain location: head Pain description: migraine Aggravating factors: stress, tension Relieving factors: heat, rest     TODAY'S TREATMENT:   Cervical ROM     Right Left  Flexion 85  Extension 55  Side Bending 35 35  Rotation 69 63      Treatment J mobilization for postural kyphosis reduction 4x 30 seconds cervical and thoracic mobilizations grade II for reduced hypomobility x 6 minutes       Trigger Point Dry Needling (TDN), unbilled Education performed with patient regarding potential benefit of TDN. Reviewed precautions and risks with patient. Reviewed special precautions/risks over lung fields which include pneumothorax. Reviewed signs and symptoms of pneumothorax and advised pt to go to ER immediately if these symptoms develop advise them of dry needling treatment. Extensive time spent with pt to ensure full understanding of TDN risks. Pt provided verbal consent to treatment. TDN performed to  with 0.30 x 30 and .2 x 0.3 single needle placements with local twitch response (LTR). Pistoning  technique utilized. Improved pain-free motion following intervention. L and Rcervical paraspinals, Upper trap (L and R), temporalis, suboccipital musculature  x  24 minutes       PATIENT EDUCATION: Education details: exercise technique, TDN Person educated: Patient Education method: Explanation, Demonstration, Tactile cues, and Verbal cues Education comprehension: verbalized understanding, returned demonstration, verbal cues required, and tactile cues required   HOME EXERCISE PROGRAM: See prior sessions    PT Short Term Goals      PT SHORT TERM GOAL #1   Title Pt will be independent with HEP in order to decrease pain in order to improve pain-free function at home and work.    Baseline 6/7: HEP given 9/1: HEp compliant 11/15: HEP compliant    Time 6    Period Weeks    Status Achieved    Target Date 08/07/21              PT Long Term Goals -      PT LONG TERM GOAL #1   Title Patient will increase FOTO score to equal to or greater than 59%    to demonstrate statistically significant  improvement in mobility and quality of life.    Baseline 6/7: 50% 9/1: 59% 11/15: 86% 2/14: 71%    Time 12    Period Weeks    Status Achieved      PT LONG TERM GOAL #2   Title Pt will decrease worst pain as reported on NPRS by at least 3 points (3/10) in order to demonstrate clinically significant reduction in pain.    Baseline 6/7: 6/10 9/1: 4/10 11/15: 5/10 2/14: 3-4/10 4/25: 6/10  8/8: 6/10   Time 12    Period Weeks    Status Partially Met    Target Date 08/25/2022       PT LONG TERM GOAL #3   Title Pt will demonstrate pain-free full range cervical motion in order to perform IADLs such as driving and household chores without increase in symptoms.    Baseline 6/7: see note for limitation 9/1:flexion 54, extension 60, SB R 35 L 30, rotation R 51 L 62 11/15: flexion 58, extension 60, SB R and L 35, R rotation 59 L rotation 60 2/14: flexion 70, extension 60 SB R and L 38, R rotation 59 L  64 4/25: see note 8/8: see note   Time 12    Period Weeks    Status Partially Met    Target Date 08/25/2022       PT LONG TERM GOAL #4   Title Patient will have less than two days a week of high pain migraines and demonstrate ability to self control/contain migraines.    Baseline 9/1:every day getting the mild ones, 2x/week for high pain migraines. 11/15: two days of high pain in past month, has mini headaches every day 2/14: has worsening headaches in the evening but only 2-3 days of high pain 4/25: 2 days of high pain 8/8: has had a recent flare up lasting multiple days; on average 1/week though prior to flair up   Time 12    Period Weeks    Status Partially Met    Target Date 08/25/2022        PT LONG TERM GOAL #5   Title Patient will reduce Neck Disability Index score to <10% to demonstrate minimal disability with ADL's including improved sleeping tolerance, sitting tolerance, etc for better mobility at home and work.    Baseline 2/14: 18% 4/25: 16% 8/8: 11%   Time 12    Period Weeks    Status Partially Met    Target Date 08/25/2022               Plan     Clinical Impression Statement Patient had a major flare during this recert. Due to her recent increase in work hours,work stress, and demands in daily life she will require continued therapy. Prior to her flare up however she is having decreased frequency of high pain migraines. Skilled therapy has demonstrated a successful trend of symptoms reduction.  She will benefit from skilled physical therapy to reduce pain, improve patient independence with symptom management, and improve quality of life    Personal Factors and Comorbidities Age;Behavior Pattern;Comorbidity 3+;Fitness;Past/Current Experience;Profession;Time since onset of injury/illness/exacerbation    Comorbidities HLD, aortic atherosclerosis, asthma, COVID 19, SOB, hypothyroidism, atypical facial pain (2019), neuropathy (2020), fibromyalgia     Examination-Activity Limitations Caring for Others;Carry;Lift;Sleep;Transfers    Examination-Participation Restrictions Cleaning;Community Activity;Interpersonal Relationship;Driving;Laundry;Shop;Occupation;Meal Prep;Yard Work    [employer] officer Evolving/Moderate complexity    Rehab Potential Fair    PT Frequency Biweekly    PT Duration 12  weeks    PT Treatment/Interventions ADLs/Self Care Home Management;Dry needling;Manual techniques;Therapeutic exercise;Therapeutic activities;Neuromuscular re-education;Aquatic Therapy;Biofeedback;Canalith Repostioning;Cryotherapy;Electrical Stimulation;Iontophoresis 64m/ml Dexamethasone;Moist Heat;Ultrasound;Patient/family education;Passive range of motion;Energy conservation;Vestibular;Joint Manipulations;Traction;Gait training;Balance training;Taping;Spinal Manipulations;Visual/perceptual remediation/compensation    PT Next Visit Plan exercise technique, body mechanics    Consulted and Agree with Plan of Care Patient              MJanna Arch PT 06/02/2022, 8:00 AM

## 2022-06-02 ENCOUNTER — Other Ambulatory Visit (HOSPITAL_COMMUNITY): Payer: Self-pay

## 2022-06-02 ENCOUNTER — Ambulatory Visit: Payer: 59 | Attending: Internal Medicine

## 2022-06-02 ENCOUNTER — Ambulatory Visit: Payer: 59

## 2022-06-02 DIAGNOSIS — R293 Abnormal posture: Secondary | ICD-10-CM | POA: Diagnosis not present

## 2022-06-02 DIAGNOSIS — R519 Headache, unspecified: Secondary | ICD-10-CM | POA: Insufficient documentation

## 2022-06-02 DIAGNOSIS — M542 Cervicalgia: Secondary | ICD-10-CM | POA: Insufficient documentation

## 2022-06-02 MED ORDER — NURTEC 75 MG PO TBDP
75.0000 mg | ORAL_TABLET | Freq: Every day | ORAL | 5 refills | Status: DC | PRN
Start: 2022-06-01 — End: 2024-04-06
  Filled 2022-06-02: qty 8, 30d supply, fill #0
  Filled 2023-01-05 – 2023-03-05 (×3): qty 8, 30d supply, fill #1
  Filled 2023-05-13: qty 8, 30d supply, fill #2

## 2022-06-02 MED ORDER — CLONIDINE HCL 0.1 MG PO TABS
0.1000 mg | ORAL_TABLET | Freq: Two times a day (BID) | ORAL | 3 refills | Status: DC
Start: 2022-06-02 — End: 2023-06-16
  Filled 2022-06-02 – 2022-07-11 (×2): qty 180, 90d supply, fill #0
  Filled 2022-10-04: qty 180, 90d supply, fill #1
  Filled 2023-01-05: qty 180, 90d supply, fill #2
  Filled 2023-04-09: qty 180, 90d supply, fill #3

## 2022-06-02 MED ORDER — FROVATRIPTAN SUCCINATE 2.5 MG PO TABS
2.5000 mg | ORAL_TABLET | Freq: Every day | ORAL | 11 refills | Status: DC | PRN
  Filled 2022-06-02: qty 9, 30d supply, fill #0
  Filled 2022-06-30: qty 9, 30d supply, fill #1
  Filled 2022-08-20: qty 8, 8d supply, fill #2
  Filled 2022-08-21: qty 1, 1d supply, fill #2
  Filled 2022-10-04: qty 9, 15d supply, fill #3
  Filled 2022-11-15: qty 9, 15d supply, fill #4
  Filled 2023-03-04: qty 9, 15d supply, fill #5
  Filled 2023-05-13: qty 9, 15d supply, fill #6

## 2022-06-02 MED ORDER — AIMOVIG 140 MG/ML ~~LOC~~ SOAJ: 140.0000 mg | SUBCUTANEOUS | 11 refills | Status: DC

## 2022-06-03 ENCOUNTER — Other Ambulatory Visit (HOSPITAL_COMMUNITY): Payer: Self-pay

## 2022-06-05 ENCOUNTER — Other Ambulatory Visit (HOSPITAL_COMMUNITY): Payer: Self-pay

## 2022-06-05 DIAGNOSIS — M818 Other osteoporosis without current pathological fracture: Secondary | ICD-10-CM | POA: Diagnosis not present

## 2022-06-08 ENCOUNTER — Other Ambulatory Visit (HOSPITAL_COMMUNITY): Payer: Self-pay

## 2022-06-08 NOTE — Telephone Encounter (Signed)
Spoke to patient and advised change in Xolair dosing to 375 every 2 weeks

## 2022-06-09 NOTE — Telephone Encounter (Signed)
Awesome sauce - thank you!   Salvatore Marvel, MD [facility] [place] [facility] [place]

## 2022-06-11 ENCOUNTER — Other Ambulatory Visit (HOSPITAL_COMMUNITY): Payer: Self-pay

## 2022-06-12 ENCOUNTER — Other Ambulatory Visit (HOSPITAL_COMMUNITY): Payer: Self-pay

## 2022-06-13 ENCOUNTER — Other Ambulatory Visit (HOSPITAL_COMMUNITY): Payer: Self-pay

## 2022-06-15 ENCOUNTER — Other Ambulatory Visit (HOSPITAL_COMMUNITY): Payer: Self-pay

## 2022-06-16 ENCOUNTER — Ambulatory Visit: Payer: 59

## 2022-06-17 NOTE — Therapy (Signed)
OUTPATIENT PHYSICAL THERAPY TREATMENT NOTE   Patient Name: Rachel Fry MRN: [id number] DOB:1956-08-15, 66 y.o., female Today's Date: 06/18/2022  PCP: [name] MD  REFERRING PROVIDER: [name] MD    PT End of Session - 06/18/22 1620     Visit Number 27    Number of Visits 32    Date for PT Re-Evaluation 08/25/22    PT Start Time 3790    PT Stop Time 1429    PT Time Calculation (min) 44 min    Activity Tolerance Patient tolerated treatment well    Behavior During Therapy WFL for tasks assessed/performed               Past Medical History:  Diagnosis Date   Allergy    Asthma    COVID-19    History of kidney stones    Hypothyroidism    Migraines    Numbness and tingling    left side of body, occasionally right side    S/P Botox injection    Urticaria    Past Surgical History:  Procedure Laterality Date   ADENOIDECTOMY     ARM NEUROPLASTY Right 1994 and 1996   for chronic pain   CESAREAN SECTION     CYSTOSCOPY W/ RETROGRADES Left 04/23/2020   Procedure: CYSTOSCOPY WITH RETROGRADE PYELOGRAM;  Surgeon: Abbie Sons, MD;  Location: [facility];  Service: Urology;  Laterality: Left;   CYSTOSCOPY W/ RETROGRADES Left 05/13/2020   Procedure: CYSTOSCOPY WITH RETROGRADE PYELOGRAM;  Surgeon: Hollice Espy, MD;  Location: [facility];  Service: Urology;  Laterality: Left;   CYSTOSCOPY WITH STENT PLACEMENT Left 04/23/2020   Procedure: CYSTOSCOPY WITH STENT PLACEMENT;  Surgeon: Abbie Sons, MD;  Location: [facility];  Service: Urology;  Laterality: Left;   CYSTOSCOPY/URETEROSCOPY/HOLMIUM LASER/STENT PLACEMENT Left 05/13/2020   Procedure: CYSTOSCOPY/URETEROSCOPY/LASER/STENT EXCHANGE;  Surgeon: Hollice Espy, MD;  Location: [facility];  Service: Urology;  Laterality: Left;   OVARIAN CYST REMOVAL  1973   STONE EXTRACTION WITH BASKET  05/13/2020   Procedure: STONE EXTRACTION WITH BASKET;  Surgeon: Hollice Espy, MD;  Location: [facility];  Service: Urology;;    [place]   Patient Active Problem List   Diagnosis Date Noted   Dry eyes 05/12/2022   Asthma 05/12/2022   Fibromyalgia 05/12/2022   Hyperlipidemia 05/12/2022   Osteoporosis 03/13/2021   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 hauler 12/25/2020   Chronic urticaria 12/25/2020   Moderate persistent asthma, uncomplicated 24/06/7352   Ageusia 08/12/2020   Anosmia 08/12/2020   Myalgia, epidemic 08/12/2020   Cough with exposure to COVID-19 virus 08/12/2020   Olfactory impairment 05/29/2020   Acute UTI 04/23/2020   Left ureteral stone 04/23/2020   Intermittent chest pain 02/09/2020   History of COVID-19 02/09/2020   Shortness of breath 02/09/2020   Loss of taste 02/09/2020   Loss of smell 02/09/2020   Rash 02/09/2020   Arthralgia 02/09/2020   Benign essential hypertension 10/25/2019   Gastroesophageal reflux disease without esophagitis 06/23/2019   Chronic migraine 06/23/2019   Neuropathy 11/17/2018   Atypical facial pain 06/30/2018   Hypothyroidism 05/19/2018   Postoperative history of checked last year 04/22/2017   Weakness of left arm 04/22/2017   Numbness and tingling in left arm 04/22/2017   Lower back pain 04/02/2016   Acute stress disorder 02/25/2015   Anxiety 02/25/2015   Airway hyperreactivity 02/25/2015   Bradycardia 02/25/2015   Hypersomnia 02/25/2015   Clinical depression 02/25/2015  Elevated blood sugar 02/25/2015   Fibrositis 02/25/2015   Acid reflux 02/25/2015   Hepatitis non A non B 02/25/2015   H/O disease 02/25/2015   Hypercholesteremia 02/25/2015   Headache, migraine 02/25/2015   Muscle ache 02/25/2015   Allergic rhinitis, seasonal 02/25/2015   Avitaminosis D 02/25/2015    REFERRING DIAG: Migraine  THERAPY DIAG:  Abnormal posture  Nonintractable headache, unspecified chronicity pattern, unspecified headache type  Cervicalgia  Rationale for Evaluation and Treatment Rehabilitation  PERTINENT HISTORY:  Patient is a 66 year old female who presents to physical therapy for migraines with specific referral for dry needling.  Patient has been having migraines for about 14-15 years. They have changed from front temporal all day long 2-3/10, a couple years ago started having tingling in left side of body, numbness in arm and leg. Additionally sometimes get's belly aches.  Changed medication in 2020. Was getting trigger injections but it was too expensive. PMH of HLD, aortic atherosclerosis, asthma, COVID 19, SOB, hypothyroidism, atypical facial pain (2019), neuropathy (2020), fibromyalgia   PRECAUTIONS: n/a  SUBJECTIVE: Patient has been having increased migraines lately. Her medication has kicked in today though and her pain is manageable. Has increase stress related to work.   PAIN:  Are you having pain? Yes: NPRS scale: 3/10 Pain location: head Pain description: migraine Aggravating factors: stress, tension Relieving factors: heat, rest     TODAY'S TREATMENT:        Treatment Cervical side bend with overpressure to glenohumeral joint 5x 30 seconds Cervical rotation with overpressure to glenohumeral joint: 5x 30 seconds Suboccipital release 4x30 seconds J mobilization for postural kyphosis reduction 4x 30 seconds cervical and thoracic mobilizations grade II for reduced hypomobility x 6 minutes Overhead PVC raise supine 10x       Trigger Point Dry Needling (TDN), unbilled Education performed with patient regarding potential benefit of TDN. Reviewed precautions and risks with patient. Reviewed special precautions/risks over lung fields which include pneumothorax. Reviewed signs and symptoms of pneumothorax and advised pt to go to ER immediately if these symptoms develop advise them of dry needling treatment. Extensive time spent with pt to ensure full understanding of TDN risks. Pt provided verbal consent to treatment. TDN performed to  with 0.30 x 30 and .2 x 0.3 single needle placements  with local twitch response (LTR). Pistoning technique utilized. Improved pain-free motion following intervention. L and Rcervical paraspinals, Upper trap (L and R), temporalis, suboccipital musculature  x  24 minutes       PATIENT EDUCATION: Education details: exercise technique, TDN Person educated: Patient Education method: Explanation, Demonstration, Tactile cues, and Verbal cues Education comprehension: verbalized understanding, returned demonstration, verbal cues required, and tactile cues required   HOME EXERCISE PROGRAM: See prior sessions    PT Short Term Goals      PT SHORT TERM GOAL #1   Title Pt will be independent with HEP in order to decrease pain in order to improve pain-free function at home and work.    Baseline 6/7: HEP given 9/1: HEp compliant 11/15: HEP compliant    Time 6    Period Weeks    Status Achieved    Target Date 08/07/21              PT Long Term Goals -      PT LONG TERM GOAL #1   Title Patient will increase FOTO score to equal to or greater than 59%    to demonstrate statistically significant improvement in mobility and quality  of life.    Baseline 6/7: 50% 9/1: 59% 11/15: 86% 2/14: 71%    Time 12    Period Weeks    Status Achieved      PT LONG TERM GOAL #2   Title Pt will decrease worst pain as reported on NPRS by at least 3 points (3/10) in order to demonstrate clinically significant reduction in pain.    Baseline 6/7: 6/10 9/1: 4/10 11/15: 5/10 2/14: 3-4/10 4/25: 6/10    Time 12    Period Weeks    Status Partially Met    Target Date 05/26/22      PT LONG TERM GOAL #3   Title Pt will demonstrate pain-free full range cervical motion in order to perform IADLs such as driving and household chores without increase in symptoms.    Baseline 6/7: see note for limitation 9/1:flexion 54, extension 60, SB R 35 L 30, rotation R 51 L 62 11/15: flexion 58, extension 60, SB R and L 35, R rotation 59 L rotation 60 2/14: flexion 70, extension 60 SB  R and L 38, R rotation 59 L 64 4/25: see note    Time 12    Period Weeks    Status Partially Met    Target Date 05/26/22      PT LONG TERM GOAL #4   Title Patient will have less than two days a week of high pain migraines and demonstrate ability to self control/contain migraines.    Baseline 9/1:every day getting the mild ones, 2x/week for high pain migraines. 11/15: two days of high pain in past month, has mini headaches every day 2/14: has worsening headaches in the evening but only 2-3 days of high pain 4/25: 2 days of high pain    Time 12    Period Weeks    Status Partially Met    Target Date 05/26/22      PT LONG TERM GOAL #5   Title Patient will reduce Neck Disability Index score to <10% to demonstrate minimal disability with ADL's including improved sleeping tolerance, sitting tolerance, etc for better mobility at home and work.    Baseline 2/14: 18% 4/25: 16%    Time 12    Period Weeks    Status Partially Met    Target Date 05/26/22              Plan     Clinical Impression Statement Patient is highly motivated throughout physical therapy. Multiple large trigger points released throughout session with patient having significant improvement of posture at end of session. Postural correction techniques utilized with positive effects.  She will benefit from skilled physical therapy to reduce pain, improve patient independence with symptom management, and improve quality of life    Personal Factors and Comorbidities Age;Behavior Pattern;Comorbidity 3+;Fitness;Past/Current Experience;Profession;Time since onset of injury/illness/exacerbation    Comorbidities HLD, aortic atherosclerosis, asthma, COVID 19, SOB, hypothyroidism, atypical facial pain (2019), neuropathy (2020), fibromyalgia    Examination-Activity Limitations Caring for Others;Carry;Lift;Sleep;Transfers    Examination-Participation Restrictions Cleaning;Community Activity;Interpersonal  Relationship;Driving;Laundry;Shop;Occupation;Meal Prep;Yard Work    [employer] officer Evolving/Moderate complexity    Rehab Potential Fair    PT Frequency Biweekly    PT Duration 12 weeks    PT Treatment/Interventions ADLs/Self Care Home Management;Dry needling;Manual techniques;Therapeutic exercise;Therapeutic activities;Neuromuscular re-education;Aquatic Therapy;Biofeedback;Canalith Repostioning;Cryotherapy;Electrical Stimulation;Iontophoresis 4mg /ml Dexamethasone;Moist Heat;Ultrasound;Patient/family education;Passive range of motion;Energy conservation;Vestibular;Joint Manipulations;Traction;Gait training;Balance training;Taping;Spinal Manipulations;Visual/perceptual remediation/compensation    PT Next Visit Plan exercise technique, body mechanics    Consulted and Agree with Plan of Care  Patient              Janna Arch, PT 06/18/2022, 4:26 PM

## 2022-06-18 ENCOUNTER — Ambulatory Visit: Payer: 59

## 2022-06-18 DIAGNOSIS — M542 Cervicalgia: Secondary | ICD-10-CM

## 2022-06-18 DIAGNOSIS — R293 Abnormal posture: Secondary | ICD-10-CM | POA: Diagnosis not present

## 2022-06-18 DIAGNOSIS — R519 Headache, unspecified: Secondary | ICD-10-CM | POA: Diagnosis not present

## 2022-06-19 ENCOUNTER — Other Ambulatory Visit (HOSPITAL_COMMUNITY): Payer: Self-pay

## 2022-06-20 ENCOUNTER — Other Ambulatory Visit: Payer: Self-pay | Admitting: Allergy & Immunology

## 2022-06-22 ENCOUNTER — Other Ambulatory Visit (HOSPITAL_COMMUNITY): Payer: Self-pay

## 2022-06-22 MED ORDER — FLUTICASONE PROPIONATE HFA 110 MCG/ACT IN AERO
2.0000 | INHALATION_SPRAY | Freq: Two times a day (BID) | RESPIRATORY_TRACT | 4 refills | Status: DC
  Filled 2022-06-22: qty 36, 90d supply, fill #0
  Filled 2022-07-20 – 2022-07-26 (×3): qty 12, 30d supply, fill #1
  Filled 2022-09-20: qty 24, 60d supply, fill #1

## 2022-06-23 ENCOUNTER — Ambulatory Visit (INDEPENDENT_AMBULATORY_CARE_PROVIDER_SITE_OTHER): Payer: 59 | Admitting: *Deleted

## 2022-06-23 DIAGNOSIS — L501 Idiopathic urticaria: Secondary | ICD-10-CM | POA: Diagnosis not present

## 2022-06-23 MED ORDER — OMALIZUMAB 150 MG/ML ~~LOC~~ SOSY
375.0000 mg | PREFILLED_SYRINGE | SUBCUTANEOUS | Status: DC
Start: 2022-06-23 — End: 2022-07-10
  Administered 2022-06-23: 375 mg via SUBCUTANEOUS

## 2022-06-25 ENCOUNTER — Other Ambulatory Visit (HOSPITAL_COMMUNITY): Payer: Self-pay

## 2022-06-30 ENCOUNTER — Other Ambulatory Visit (HOSPITAL_COMMUNITY): Payer: Self-pay

## 2022-07-01 ENCOUNTER — Ambulatory Visit: Payer: 59

## 2022-07-06 ENCOUNTER — Other Ambulatory Visit (HOSPITAL_COMMUNITY): Payer: Self-pay

## 2022-07-07 ENCOUNTER — Ambulatory Visit (INDEPENDENT_AMBULATORY_CARE_PROVIDER_SITE_OTHER): Payer: 59 | Admitting: *Deleted

## 2022-07-07 DIAGNOSIS — L501 Idiopathic urticaria: Secondary | ICD-10-CM

## 2022-07-07 DIAGNOSIS — J4540 Moderate persistent asthma, uncomplicated: Secondary | ICD-10-CM

## 2022-07-07 NOTE — Therapy (Signed)
OUTPATIENT PHYSICAL THERAPY TREATMENT NOTE   Patient Name: Rachel Fry MRN: [id number] DOB:[birth year], 66 y.o., female Today's Date: 07/08/2022  PCP: [name] MD  REFERRING PROVIDER: [name] MD    PT End of Session - 07/08/22 1429     Visit Number 28    Number of Visits 32    Date for PT Re-Evaluation 08/25/22    PT Start Time 1430    PT Stop Time 1505    PT Time Calculation (min) 35 min    Activity Tolerance Patient tolerated treatment well    Behavior During Therapy WFL for tasks assessed/performed                Past Medical History:  Diagnosis Date   Allergy    Asthma    COVID-19    History of kidney stones    Hypothyroidism    Migraines    Numbness and tingling    left side of body, occasionally right side    S/P Botox injection    Urticaria    Past Surgical History:  Procedure Laterality Date   ADENOIDECTOMY     ARM NEUROPLASTY Right 1994 and 1996   for chronic pain   CESAREAN SECTION     CYSTOSCOPY W/ RETROGRADES Left 04/23/2020   Procedure: CYSTOSCOPY WITH RETROGRADE PYELOGRAM;  Surgeon: Abbie Sons, MD;  Location: [facility];  Service: Urology;  Laterality: Left;   CYSTOSCOPY W/ RETROGRADES Left 05/13/2020   Procedure: CYSTOSCOPY WITH RETROGRADE PYELOGRAM;  Surgeon: Hollice Espy, MD;  Location: [facility];  Service: Urology;  Laterality: Left;   CYSTOSCOPY WITH STENT PLACEMENT Left 04/23/2020   Procedure: CYSTOSCOPY WITH STENT PLACEMENT;  Surgeon: Abbie Sons, MD;  Location: [facility];  Service: Urology;  Laterality: Left;   CYSTOSCOPY/URETEROSCOPY/HOLMIUM LASER/STENT PLACEMENT Left 05/13/2020   Procedure: CYSTOSCOPY/URETEROSCOPY/LASER/STENT EXCHANGE;  Surgeon: Hollice Espy, MD;  Location: [facility];  Service: Urology;  Laterality: Left;   OVARIAN CYST REMOVAL  1973   STONE EXTRACTION WITH BASKET  05/13/2020   Procedure: STONE EXTRACTION WITH BASKET;  Surgeon: Hollice Espy, MD;  Location: [facility];  Service: Urology;;    [place]   Patient Active Problem List   Diagnosis Date Noted   Dry eyes 05/12/2022   Asthma 05/12/2022   Fibromyalgia 05/12/2022   Hyperlipidemia 05/12/2022   Osteoporosis 03/13/2021   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 hauler 12/25/2020   Chronic urticaria 12/25/2020   Moderate persistent asthma, uncomplicated 64/40/3474   Ageusia 08/12/2020   Anosmia 08/12/2020   Myalgia, epidemic 08/12/2020   Cough with exposure to COVID-19 virus 08/12/2020   Olfactory impairment 05/29/2020   Acute UTI 04/23/2020   Left ureteral stone 04/23/2020   Intermittent chest pain 02/09/2020   History of COVID-19 02/09/2020   Shortness of breath 02/09/2020   Loss of taste 02/09/2020   Loss of smell 02/09/2020   Rash 02/09/2020   Arthralgia 02/09/2020   Benign essential hypertension 10/25/2019   Gastroesophageal reflux disease without esophagitis 06/23/2019   Chronic migraine 06/23/2019   Neuropathy 11/17/2018   Atypical facial pain 06/30/2018   Hypothyroidism 05/19/2018   Postoperative history of checked last year 04/22/2017   Weakness of left arm 04/22/2017   Numbness and tingling in left arm 04/22/2017   Lower back pain 04/02/2016   Acute stress disorder 02/25/2015   Anxiety 02/25/2015   Airway hyperreactivity 02/25/2015   Bradycardia 02/25/2015   Hypersomnia 02/25/2015   Clinical depression  02/25/2015   Elevated blood sugar 02/25/2015   Fibrositis 02/25/2015   Acid reflux 02/25/2015   Hepatitis non A non B 02/25/2015   H/O disease 02/25/2015   Hypercholesteremia 02/25/2015   Headache, migraine 02/25/2015   Muscle ache 02/25/2015   Allergic rhinitis, seasonal 02/25/2015   Avitaminosis D 02/25/2015    REFERRING DIAG: Migraine  THERAPY DIAG:  Abnormal posture  Nonintractable headache, unspecified chronicity pattern, unspecified headache type  Cervicalgia  Rationale for Evaluation and Treatment Rehabilitation  PERTINENT  HISTORY: Patient is a 66 year old female who presents to physical therapy for migraines with specific referral for dry needling.  Patient has been having migraines for about 14-15 years. They have changed from front temporal all day long 2-3/10, a couple years ago started having tingling in left side of body, numbness in arm and leg. Additionally sometimes get's belly aches.  Changed medication in 2020. Was getting trigger injections but it was too expensive. PMH of HLD, aortic atherosclerosis, asthma, COVID 19, SOB, hypothyroidism, atypical facial pain (2019), neuropathy (2020), fibromyalgia   PRECAUTIONS: n/a  SUBJECTIVE: Patient report she had a 4-5 day streak of bad days. Has been able to get into physician who was able to renew the prescriptiion.   PAIN:  Are you having pain? Yes: NPRS scale: 3/10 Pain location: head Pain description: migraine Aggravating factors: stress, tension Relieving factors: heat, rest     TODAY'S TREATMENT:        Treatment Cervical side bend with overpressure to glenohumeral joint 5x 30 seconds Cervical rotation with overpressure to glenohumeral joint: 5x 30 seconds Suboccipital release 4x30 seconds J mobilization for postural kyphosis reduction 4x 30 seconds cervical and thoracic mobilizations grade II for reduced hypomobility x 6 minutes        Trigger Point Dry Needling (TDN), unbilled Education performed with patient regarding potential benefit of TDN. Reviewed precautions and risks with patient. Reviewed special precautions/risks over lung fields which include pneumothorax. Reviewed signs and symptoms of pneumothorax and advised pt to go to ER immediately if these symptoms develop advise them of dry needling treatment. Extensive time spent with pt to ensure full understanding of TDN risks. Pt provided verbal consent to treatment. TDN performed to  with 0.30 x 30 and .2 x 0.3 single needle placements with local twitch response (LTR). Pistoning  technique utilized. Improved pain-free motion following intervention. L and Rcervical paraspinals, Upper trap (L and R), temporalis, suboccipital musculature  x  21 minutes       PATIENT EDUCATION: Education details: exercise technique, TDN Person educated: Patient Education method: Explanation, Demonstration, Tactile cues, and Verbal cues Education comprehension: verbalized understanding, returned demonstration, verbal cues required, and tactile cues required   HOME EXERCISE PROGRAM: See prior sessions    PT Short Term Goals      PT SHORT TERM GOAL #1   Title Pt will be independent with HEP in order to decrease pain in order to improve pain-free function at home and work.    Baseline 6/7: HEP given 9/1: HEp compliant 11/15: HEP compliant    Time 6    Period Weeks    Status Achieved    Target Date 08/07/21              PT Long Term Goals -      PT LONG TERM GOAL #1   Title Patient will increase FOTO score to equal to or greater than 59%    to demonstrate statistically significant improvement in mobility and quality of  life.    Baseline 6/7: 50% 9/1: 59% 11/15: 86% 2/14: 71%    Time 12    Period Weeks    Status Achieved      PT LONG TERM GOAL #2   Title Pt will decrease worst pain as reported on NPRS by at least 3 points (3/10) in order to demonstrate clinically significant reduction in pain.    Baseline 6/7: 6/10 9/1: 4/10 11/15: 5/10 2/14: 3-4/10 4/25: 6/10    Time 12    Period Weeks    Status Partially Met    Target Date 05/26/22      PT LONG TERM GOAL #3   Title Pt will demonstrate pain-free full range cervical motion in order to perform IADLs such as driving and household chores without increase in symptoms.    Baseline 6/7: see note for limitation 9/1:flexion 54, extension 60, SB R 35 L 30, rotation R 51 L 62 11/15: flexion 58, extension 60, SB R and L 35, R rotation 59 L rotation 60 2/14: flexion 70, extension 60 SB R and L 38, R rotation 59 L 64 4/25: see  note    Time 12    Period Weeks    Status Partially Met    Target Date 05/26/22      PT LONG TERM GOAL #4   Title Patient will have less than two days a week of high pain migraines and demonstrate ability to self control/contain migraines.    Baseline 9/1:every day getting the mild ones, 2x/week for high pain migraines. 11/15: two days of high pain in past month, has mini headaches every day 2/14: has worsening headaches in the evening but only 2-3 days of high pain 4/25: 2 days of high pain    Time 12    Period Weeks    Status Partially Met    Target Date 05/26/22      PT LONG TERM GOAL #5   Title Patient will reduce Neck Disability Index score to <10% to demonstrate minimal disability with ADL's including improved sleeping tolerance, sitting tolerance, etc for better mobility at home and work.    Baseline 2/14: 18% 4/25: 16%    Time 12    Period Weeks    Status Partially Met    Target Date 05/26/22              Plan     Clinical Impression Statement Patient presents to physical therapy with excellent motivation. She has significant trigger points in bilateral upper trap and suboccipital region with radiating pain reduced by end of session. Mobilizations resulted in cavitation with pain reduction.  She will benefit from skilled physical therapy to reduce pain, improve patient independence with symptom management, and improve quality of life    Personal Factors and Comorbidities Age;Behavior Pattern;Comorbidity 3+;Fitness;Past/Current Experience;Profession;Time since onset of injury/illness/exacerbation    Comorbidities HLD, aortic atherosclerosis, asthma, COVID 19, SOB, hypothyroidism, atypical facial pain (2019), neuropathy (2020), fibromyalgia    Examination-Activity Limitations Caring for Others;Carry;Lift;Sleep;Transfers    Examination-Participation Restrictions Cleaning;Community Activity;Interpersonal Relationship;Driving;Laundry;Shop;Occupation;Meal Prep;Yard Work     [employer] officer Evolving/Moderate complexity    Rehab Potential Fair    PT Frequency Biweekly    PT Duration 12 weeks    PT Treatment/Interventions ADLs/Self Care Home Management;Dry needling;Manual techniques;Therapeutic exercise;Therapeutic activities;Neuromuscular re-education;Aquatic Therapy;Biofeedback;Canalith Repostioning;Cryotherapy;Electrical Stimulation;Iontophoresis 4mg /ml Dexamethasone;Moist Heat;Ultrasound;Patient/family education;Passive range of motion;Energy conservation;Vestibular;Joint Manipulations;Traction;Gait training;Balance training;Taping;Spinal Manipulations;Visual/perceptual remediation/compensation    PT Next Visit Plan exercise technique, body mechanics    Consulted and Agree with Plan  of Care Patient              Janna Arch, PT 07/08/2022, 3:05 PM

## 2022-07-08 ENCOUNTER — Ambulatory Visit: Payer: 59 | Attending: Internal Medicine

## 2022-07-08 DIAGNOSIS — R519 Headache, unspecified: Secondary | ICD-10-CM | POA: Insufficient documentation

## 2022-07-08 DIAGNOSIS — M542 Cervicalgia: Secondary | ICD-10-CM | POA: Diagnosis not present

## 2022-07-08 DIAGNOSIS — R293 Abnormal posture: Secondary | ICD-10-CM | POA: Diagnosis not present

## 2022-07-10 ENCOUNTER — Other Ambulatory Visit (HOSPITAL_COMMUNITY): Payer: Self-pay

## 2022-07-10 MED ORDER — OMALIZUMAB 150 MG/ML ~~LOC~~ SOSY
375.0000 mg | PREFILLED_SYRINGE | SUBCUTANEOUS | Status: DC
Start: 2022-07-10 — End: 2022-11-17
  Administered 2022-07-07 – 2022-07-23 (×2): 375 mg via SUBCUTANEOUS
  Administered 2022-08-20: 300 mg via SUBCUTANEOUS

## 2022-07-10 NOTE — Addendum Note (Signed)
Addended by: Carin Hock on: 07/10/2022 11:22 AM   Modules accepted: Orders

## 2022-07-11 ENCOUNTER — Other Ambulatory Visit (HOSPITAL_COMMUNITY): Payer: Self-pay

## 2022-07-13 ENCOUNTER — Other Ambulatory Visit (HOSPITAL_COMMUNITY): Payer: Self-pay

## 2022-07-15 ENCOUNTER — Ambulatory Visit: Payer: 59

## 2022-07-15 ENCOUNTER — Other Ambulatory Visit (HOSPITAL_COMMUNITY): Payer: Self-pay

## 2022-07-17 ENCOUNTER — Other Ambulatory Visit (HOSPITAL_COMMUNITY): Payer: Self-pay

## 2022-07-20 ENCOUNTER — Encounter: Payer: Self-pay | Admitting: Allergy & Immunology

## 2022-07-20 ENCOUNTER — Other Ambulatory Visit (HOSPITAL_COMMUNITY): Payer: Self-pay

## 2022-07-20 NOTE — Therapy (Signed)
OUTPATIENT PHYSICAL THERAPY TREATMENT NOTE   Patient Name: Rachel Fry MRN: [id number] DOB:10/13/1956, 66 y.o., female Today's Date: 07/22/2022  PCP: [name] MD  REFERRING PROVIDER: [name] MD    PT End of Session - 07/21/22 1603     Visit Number 29    Number of Visits 32    Date for PT Re-Evaluation 08/25/22    PT Start Time 1600    PT Stop Time 1640    PT Time Calculation (min) 40 min    Activity Tolerance Patient tolerated treatment well    Behavior During Therapy WFL for tasks assessed/performed                 Past Medical History:  Diagnosis Date   Allergy    Asthma    COVID-19    History of kidney stones    Hypothyroidism    Migraines    Numbness and tingling    left side of body, occasionally right side    S/P Botox injection    Urticaria    Past Surgical History:  Procedure Laterality Date   ADENOIDECTOMY     ARM NEUROPLASTY Right 1994 and 1996   for chronic pain   CESAREAN SECTION     CYSTOSCOPY W/ RETROGRADES Left 04/23/2020   Procedure: CYSTOSCOPY WITH RETROGRADE PYELOGRAM;  Surgeon: Abbie Sons, MD;  Location: [facility];  Service: Urology;  Laterality: Left;   CYSTOSCOPY W/ RETROGRADES Left 05/13/2020   Procedure: CYSTOSCOPY WITH RETROGRADE PYELOGRAM;  Surgeon: Hollice Espy, MD;  Location: [facility];  Service: Urology;  Laterality: Left;   CYSTOSCOPY WITH STENT PLACEMENT Left 04/23/2020   Procedure: CYSTOSCOPY WITH STENT PLACEMENT;  Surgeon: Abbie Sons, MD;  Location: [facility];  Service: Urology;  Laterality: Left;   CYSTOSCOPY/URETEROSCOPY/HOLMIUM LASER/STENT PLACEMENT Left 05/13/2020   Procedure: CYSTOSCOPY/URETEROSCOPY/LASER/STENT EXCHANGE;  Surgeon: Hollice Espy, MD;  Location: [facility];  Service: Urology;  Laterality: Left;   OVARIAN CYST REMOVAL  1973   STONE EXTRACTION WITH BASKET  05/13/2020   Procedure: STONE EXTRACTION WITH BASKET;  Surgeon: Hollice Espy, MD;  Location: [facility];  Service: Urology;;    [place]   Patient Active Problem List   Diagnosis Date Noted   Dry eyes 05/12/2022   Asthma 05/12/2022   Fibromyalgia 05/12/2022   Hyperlipidemia 05/12/2022   Osteoporosis 03/13/2021   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 hauler 12/25/2020   Chronic urticaria 12/25/2020   Moderate persistent asthma, uncomplicated 85/63/1497   Ageusia 08/12/2020   Anosmia 08/12/2020   Myalgia, epidemic 08/12/2020   Cough with exposure to COVID-19 virus 08/12/2020   Olfactory impairment 05/29/2020   Acute UTI 04/23/2020   Left ureteral stone 04/23/2020   Intermittent chest pain 02/09/2020   History of COVID-19 02/09/2020   Shortness of breath 02/09/2020   Loss of taste 02/09/2020   Loss of smell 02/09/2020   Rash 02/09/2020   Arthralgia 02/09/2020   Benign essential hypertension 10/25/2019   Gastroesophageal reflux disease without esophagitis 06/23/2019   Chronic migraine 06/23/2019   Neuropathy 11/17/2018   Atypical facial pain 06/30/2018   Hypothyroidism 05/19/2018   Postoperative history of checked last year 04/22/2017   Weakness of left arm 04/22/2017   Numbness and tingling in left arm 04/22/2017   Lower back pain 04/02/2016   Acute stress disorder 02/25/2015   Anxiety 02/25/2015   Airway hyperreactivity 02/25/2015   Bradycardia 02/25/2015   Hypersomnia 02/25/2015   Clinical  depression 02/25/2015   Elevated blood sugar 02/25/2015   Fibrositis 02/25/2015   Acid reflux 02/25/2015   Hepatitis non A non B 02/25/2015   H/O disease 02/25/2015   Hypercholesteremia 02/25/2015   Headache, migraine 02/25/2015   Muscle ache 02/25/2015   Allergic rhinitis, seasonal 02/25/2015   Avitaminosis D 02/25/2015    REFERRING DIAG: Migraine  THERAPY DIAG:  Abnormal posture  Nonintractable headache, unspecified chronicity pattern, unspecified headache type  Cervicalgia  Rationale for Evaluation and Treatment Rehabilitation  PERTINENT  HISTORY: Patient is a 66 year old female who presents to physical therapy for migraines with specific referral for dry needling.  Patient has been having migraines for about 14-15 years. They have changed from front temporal all day long 2-3/10, a couple years ago started having tingling in left side of body, numbness in arm and leg. Additionally sometimes get's belly aches.  Changed medication in 2020. Was getting trigger injections but it was too expensive. PMH of HLD, aortic atherosclerosis, asthma, COVID 19, SOB, hypothyroidism, atypical facial pain (2019), neuropathy (2020), fibromyalgia   PRECAUTIONS: n/a  SUBJECTIVE: Patient had nausea from migraine this morning into the afternoon.   PAIN:  Are you having pain? Yes: NPRS scale: 3/10 Pain location: head Pain description: migraine Aggravating factors: stress, tension Relieving factors: heat, rest     TODAY'S TREATMENT:        Treatment Cervical side bend with overpressure to glenohumeral joint 5x 30 seconds Cervical rotation with overpressure to glenohumeral joint: 5x 30 seconds Suboccipital release 4x30 seconds J mobilization for postural kyphosis reduction 4x 30 seconds cervical and thoracic mobilizations grade II for reduced hypomobility x 6 minutes  Robber stretch 2x 30 seconds  RTB Y 15x       Trigger Point Dry Needling (TDN), unbilled Education performed with patient regarding potential benefit of TDN. Reviewed precautions and risks with patient. Reviewed special precautions/risks over lung fields which include pneumothorax. Reviewed signs and symptoms of pneumothorax and advised pt to go to ER immediately if these symptoms develop advise them of dry needling treatment. Extensive time spent with pt to ensure full understanding of TDN risks. Pt provided verbal consent to treatment. TDN performed to  with 0.30 x 30 and .2 x 0.3 single needle placements with local twitch response (LTR). Pistoning technique utilized. Improved  pain-free motion following intervention. L and Rcervical paraspinals, Upper trap (L and R), temporalis, suboccipital musculature  x  19 minutes       PATIENT EDUCATION: Education details: exercise technique, TDN Person educated: Patient Education method: Explanation, Demonstration, Tactile cues, and Verbal cues Education comprehension: verbalized understanding, returned demonstration, verbal cues required, and tactile cues required   HOME EXERCISE PROGRAM: See prior sessions    PT Short Term Goals      PT SHORT TERM GOAL #1   Title Pt will be independent with HEP in order to decrease pain in order to improve pain-free function at home and work.    Baseline 6/7: HEP given 9/1: HEp compliant 11/15: HEP compliant    Time 6    Period Weeks    Status Achieved    Target Date 08/07/21              PT Long Term Goals -      PT LONG TERM GOAL #1   Title Patient will increase FOTO score to equal to or greater than 59%    to demonstrate statistically significant improvement in mobility and quality of life.    Baseline  6/7: 50% 9/1: 59% 11/15: 86% 2/14: 71%    Time 12    Period Weeks    Status Achieved      PT LONG TERM GOAL #2   Title Pt will decrease worst pain as reported on NPRS by at least 3 points (3/10) in order to demonstrate clinically significant reduction in pain.    Baseline 6/7: 6/10 9/1: 4/10 11/15: 5/10 2/14: 3-4/10 4/25: 6/10    Time 12    Period Weeks    Status Partially Met    Target Date 05/26/22      PT LONG TERM GOAL #3   Title Pt will demonstrate pain-free full range cervical motion in order to perform IADLs such as driving and household chores without increase in symptoms.    Baseline 6/7: see note for limitation 9/1:flexion 54, extension 60, SB R 35 L 30, rotation R 51 L 62 11/15: flexion 58, extension 60, SB R and L 35, R rotation 59 L rotation 60 2/14: flexion 70, extension 60 SB R and L 38, R rotation 59 L 64 4/25: see note    Time 12    Period  Weeks    Status Partially Met    Target Date 05/26/22      PT LONG TERM GOAL #4   Title Patient will have less than two days a week of high pain migraines and demonstrate ability to self control/contain migraines.    Baseline 9/1:every day getting the mild ones, 2x/week for high pain migraines. 11/15: two days of high pain in past month, has mini headaches every day 2/14: has worsening headaches in the evening but only 2-3 days of high pain 4/25: 2 days of high pain    Time 12    Period Weeks    Status Partially Met    Target Date 05/26/22      PT LONG TERM GOAL #5   Title Patient will reduce Neck Disability Index score to <10% to demonstrate minimal disability with ADL's including improved sleeping tolerance, sitting tolerance, etc for better mobility at home and work.    Baseline 2/14: 18% 4/25: 16%    Time 12    Period Weeks    Status Partially Met    Target Date 05/26/22              Plan     Clinical Impression Statement Patient tolerates TDN. Educated that next session will be progress note. Patient to get infusion between now and next session. Patient educated on posture and demonstrates understanding. Multiple trigger points released this session with TDN.   She will benefit from skilled physical therapy to reduce pain, improve patient independence with symptom management, and improve quality of life    Personal Factors and Comorbidities Age;Behavior Pattern;Comorbidity 3+;Fitness;Past/Current Experience;Profession;Time since onset of injury/illness/exacerbation    Comorbidities HLD, aortic atherosclerosis, asthma, COVID 19, SOB, hypothyroidism, atypical facial pain (2019), neuropathy (2020), fibromyalgia    Examination-Activity Limitations Caring for Others;Carry;Lift;Sleep;Transfers    Examination-Participation Restrictions Cleaning;Community Activity;Interpersonal Relationship;Driving;Laundry;Shop;Occupation;Meal Prep;Yard Work    [employer] officer  Evolving/Moderate complexity    Rehab Potential Fair    PT Frequency Biweekly    PT Duration 12 weeks    PT Treatment/Interventions ADLs/Self Care Home Management;Dry needling;Manual techniques;Therapeutic exercise;Therapeutic activities;Neuromuscular re-education;Aquatic Therapy;Biofeedback;Canalith Repostioning;Cryotherapy;Electrical Stimulation;Iontophoresis 22m/ml Dexamethasone;Moist Heat;Ultrasound;Patient/family education;Passive range of motion;Energy conservation;Vestibular;Joint Manipulations;Traction;Gait training;Balance training;Taping;Spinal Manipulations;Visual/perceptual remediation/compensation    PT Next Visit Plan exercise technique, body mechanics    Consulted and Agree with Plan of Care Patient  Janna Arch, PT 07/22/2022, 8:30 AM

## 2022-07-21 ENCOUNTER — Ambulatory Visit: Payer: 59

## 2022-07-21 DIAGNOSIS — R519 Headache, unspecified: Secondary | ICD-10-CM | POA: Diagnosis not present

## 2022-07-21 DIAGNOSIS — M542 Cervicalgia: Secondary | ICD-10-CM | POA: Diagnosis not present

## 2022-07-21 DIAGNOSIS — R293 Abnormal posture: Secondary | ICD-10-CM

## 2022-07-23 ENCOUNTER — Ambulatory Visit (INDEPENDENT_AMBULATORY_CARE_PROVIDER_SITE_OTHER): Payer: 59

## 2022-07-23 DIAGNOSIS — L501 Idiopathic urticaria: Secondary | ICD-10-CM

## 2022-07-23 DIAGNOSIS — J4540 Moderate persistent asthma, uncomplicated: Secondary | ICD-10-CM | POA: Diagnosis not present

## 2022-07-24 ENCOUNTER — Other Ambulatory Visit (HOSPITAL_COMMUNITY): Payer: Self-pay

## 2022-07-26 ENCOUNTER — Other Ambulatory Visit (HOSPITAL_COMMUNITY): Payer: Self-pay

## 2022-07-27 ENCOUNTER — Other Ambulatory Visit (HOSPITAL_COMMUNITY): Payer: Self-pay

## 2022-07-27 DIAGNOSIS — G43709 Chronic migraine without aura, not intractable, without status migrainosus: Secondary | ICD-10-CM | POA: Diagnosis not present

## 2022-07-27 MED ORDER — LINZESS 145 MCG PO CAPS
145.0000 ug | ORAL_CAPSULE | Freq: Every day | ORAL | 5 refills | Status: DC
  Filled 2022-07-27: qty 30, 30d supply, fill #0
  Filled 2022-08-20: qty 30, 30d supply, fill #1
  Filled 2022-09-20: qty 30, 30d supply, fill #2
  Filled 2022-10-21: qty 30, 30d supply, fill #3
  Filled 2022-11-15: qty 30, 30d supply, fill #4
  Filled 2023-01-05: qty 30, 30d supply, fill #5

## 2022-07-31 ENCOUNTER — Ambulatory Visit (INDEPENDENT_AMBULATORY_CARE_PROVIDER_SITE_OTHER): Payer: 59 | Admitting: Family Medicine

## 2022-07-31 ENCOUNTER — Encounter: Payer: Self-pay | Admitting: Family Medicine

## 2022-07-31 ENCOUNTER — Other Ambulatory Visit (HOSPITAL_COMMUNITY): Payer: Self-pay

## 2022-07-31 DIAGNOSIS — J4541 Moderate persistent asthma with (acute) exacerbation: Secondary | ICD-10-CM

## 2022-07-31 DIAGNOSIS — U099 Post covid-19 condition, unspecified: Secondary | ICD-10-CM

## 2022-07-31 DIAGNOSIS — L508 Other urticaria: Secondary | ICD-10-CM

## 2022-07-31 NOTE — Progress Notes (Signed)
RE: Rachel Fry MRN: [id number] DOB: 27-Apr-1956 Date of Telemedicine Visit: 07/31/2022  Referring provider: [name], MD Primary care provider: [name], MD  Chief Complaint: Asthma   Telemedicine Follow Up Visit via Telephone: I connected with Rachel Fry for a follow up on 08/02/22 by telephone and verified that I am speaking with the correct person using two identifiers.   I discussed the limitations, risks, security and privacy concerns of performing an evaluation and management service by telephone and the availability of in person appointments. I also discussed with the patient that there may be a patient responsible charge related to this service. The patient expressed understanding and agreed to proceed.  Patient is at home  Provider is at the office.  Visit start time: 924 Visit end time: 25 Insurance consent/check in by: [facility] consent and medical assistant/nurse: Cree  History of Present Illness: She is a 66 y.o. 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 Her previous allergy office visit was on 05/12/2022 with Dr. Ernst Bowler. In the interim, she has increased her Xolair injection from 300 mg once every 4 weeks to 375 mg once every 2 weeks in an effort to control urticaria. At today's visit, she reports since the increase in her Xolair injection dose and frequency she has been experiencing an increase in urticaria which is mainly occurring under her bra and at the waist for which she took Benadryl with mild relief of symptoms.  She continues cetirizine 10 mg once a day, famotidine 20 mg in the morning and 40 mg at night.  Asthma is reported as not well controlled with symptoms including shortness of breath especially with activity and cough. She continues Accolate and Flovent 110-2 puffs twice a day with a spacer. She is only infrequently sing albuterol.  Allergic rhinism is reported as moderately well controlled with symptoms including clear rhinorrhea and nasal congestion.  She continues cetirizine 10 mg once at bedtime and Nasacort with relief of symptoms.  Her current medications are listed in the chart.   Assessment and Plan: Rachel Fry is a 66 y.o. female with: Patient Instructions  Post COVID syndrome with urticarial rash - Let's hold the Xolair injection until around 08/18/2022. At that time, you can choose if you want to get the 300 mg dose or 375 mg dose - Increase cetirizine 10 mg to twice a day while itching or when you have hives - Continue Pepcid as you have been  Asthma Increase Flovent 110 to 2 puffs three times a day with a spacer for 1 week or until cough and wheeze free. You may pre-treat with albuterol 2 puffs about 5-15 minutes before using Flovent for the first few times.  Continue albuterol 2 puffs once every 4 hours as needed for cough or wheeze You may use albuterol 2 puffs 5-15 minutes before activity  Allergic rhinitis Continue cetirizine 10 mg once a day as needed for a runny nose or itch Continue Nasacort 2 sprays in each nostril once a day as needed for a stuffy nose Consider saline nasal rinses as needed for nasal symptoms. Use this before any medicated nasal sprays for best results  Call the clinic if this treatment plan is not working well for you  Follow up in 2 weeks or sooner if needed.     Return in about 2 weeks (around 08/14/2022), or if symptoms worsen or fail to improve.  No orders of  the defined types were placed in this encounter.  Lab Orders  No laboratory test(s) ordered today    Diagnostics: None.  Medication List:  Current Outpatient Medications  Medication Sig Dispense Refill   amphetamine-dextroamphetamine (ADDERALL) 5 MG tablet Take 1 tablet by mouth 2 (two) times daily.     atorvastatin (LIPITOR) 20 MG tablet Take 1 tablet (20 mg total) by mouth daily. 90 tablet 3   b complex  vitamins capsule Take 1 capsule by mouth daily.     Bacillus Coagulans-Inulin (PROBIOTIC) 1-250 BILLION-MG CAPS Probiotic  Take 1 cap po daily     cloNIDine (CATAPRES) 0.1 MG tablet Take 1 tablet (0.1 mg total) by mouth 2 (two) times daily. 180 tablet 3   EPINEPHrine 0.3 mg/0.3 mL IJ SOAJ injection Inject 1 pen (0.3 mg) into the muscle as needed for anaphylaxis. 4 each 1   ezetimibe (ZETIA) 10 MG tablet TAKE 1 TABLET BY MOUTH ONCE DAILY 90 tablet 3   famotidine (PEPCID) 20 MG tablet Take 20 mg by mouth 2 (two) times daily.     fluticasone (FLOVENT HFA) 110 MCG/ACT inhaler Inhale 2 puffs into the lungs 2 (two) times daily with spacer.  Rinse mouth after use.  **Please keep appointment for further refills** 12 g 4   frovatriptan (FROVA) 2.5 MG tablet Take 2.5 mg by mouth as needed for migraine. If recurs, may repeat after 2 hours. Max of 3 tabs in 24 hours.     frovatriptan (FROVA) 2.5 MG tablet TAKE 1 TABLET BY MOUTH AS DIRECTED (DO NOT EXCEED 3 TABLETS PER 24 HOURS) 18 tablet 5   frovatriptan (FROVA) 2.5 MG tablet Take 1 tablet (2.5 mg total) by mouth daily as needed for migraine 9 tablet 11   influenza vaccine (FLUCELVAX QUADRIVALENT) 0.5 ML injection Inject into the muscle. 0.5 mL 0   levothyroxine (SYNTHROID) 50 MCG tablet TAKE 1 TABLET BY MOUTH ONCE DAILY 90 tablet 3   linaclotide (LINZESS) 145 MCG CAPS capsule Take 1 capsule (145 mcg total) by mouth daily. 30 capsule 5   Menaquinone-7 (VITAMIN K2 PO) Take by mouth.     omalizumab Arvid Right) 150 MG/ML prefilled syringe Inject 375 mg into the skin every 14 (fourteen) days. 4 mL 11   omalizumab (XOLAIR) 75 MG/0.5ML prefilled syringe Inject 375 mg into the skin every 14 (fourteen) days. 1 mL 11   Polyethyl Glycol-Propyl Glycol (SYSTANE OP) Place 1-2 drops into both eyes 2 (two) times daily.     Rimegepant Sulfate (NURTEC) 75 MG TBDP DISSOLVE 1 TABLET BY MOUTH ONCE A DAY AS NEEDED FOR MIGRAINE 16 tablet 5   Rimegepant Sulfate (NURTEC) 75 MG TBDP  Dissolve 1 tablet by mouth daily as needed. 8 tablet 5   spironolactone (ALDACTONE) 50 MG tablet TAKE 1 TABLET BY MOUTH TWICE A DAY 180 tablet 3   torsemide (DEMADEX) 20 MG tablet Take 2 tablets by mouth every day as needed. 180 tablet 1   triamcinolone (NASACORT) 55 MCG/ACT AERO nasal inhaler Place 2 sprays into the nose at bedtime.     triamcinolone cream (KENALOG) 0.1 % Apply twice daily to rash up to 2 weeks/month as needed. 80 g 0   Vitamin D, Ergocalciferol, (DRISDOL) 1.25 MG (50000 UNIT) CAPS capsule TAKE 1 CAPSULE BY MOUTH EVERY WEEK 12 capsule 3   zafirlukast (ACCOLATE) 10 MG tablet Take 1 tablet (10 mg total) by mouth 2 (two) times daily. 180 tablet 3   zoledronic acid (RECLAST) 5 MG/100ML SOLN injection Inject 5 mg  into the vein as directed. Once a year.     albuterol (VENTOLIN HFA) 108 (90 Base) MCG/ACT inhaler INHALE 2 PUFFS INTO THE LUNGS EVERY 4 (FOUR) HOURS AS NEEDED FOR WHEEZING OR SHORTNESS OF BREATH. 18 g 1   Botulinum Toxin Type A (BOTOX) 200 units SOLR Inject up to 200 units into the muscle by provider to head, neck, and shoulders every 3 months (discard any unused portion) 1 each 3   cetirizine (ZYRTEC) 10 MG tablet TAKE 1 TABLET (10 MG TOTAL) BY MOUTH EVERY EVENING. 30 tablet 5   Erenumab-aooe (AIMOVIG) 140 MG/ML SOAJ Inject 1 pen into the skin every 30 days (Patient not taking: Reported on 07/31/2022) 1 mL 11   Erenumab-aooe (AIMOVIG) 140 MG/ML SOAJ Inject 140 mg as directed every 30 (thirty) days. (Patient not taking: Reported on 07/31/2022) 1 mL 0   Erenumab-aooe (AIMOVIG) 140 MG/ML SOAJ Inject 140 mg (1 pen) into the skin every 30 (thirty) days. 1 mL 11   ezetimibe (ZETIA) 10 MG tablet TAKE 1 TABLET BY MOUTH ONCE DAILY 90 tablet 3   Current Facility-Administered Medications  Medication Dose Route Frequency Provider Last Rate Last Admin   omalizumab Arvid Right) prefilled syringe 375 mg  375 mg Subcutaneous Q14 Days Valentina Shaggy, MD   375 mg at 07/23/22 1715    Allergies: Allergies  Allergen Reactions   Flonase [Fluticasone Propionate] Other (See Comments)    The scent in flonase causes wheezing    Latex     Wheezing   Fish Oil Itching   Percocet [Oxycodone-Acetaminophen] Other (See Comments)    Reports her pharmacist told her never to take this medication because of her codeine allergy   Sulfa Antibiotics     abdominal pain   Codeine Nausea And Vomiting and Rash    Tolerates tramadol    I reviewed her past medical history, social history, family history, and environmental history and no significant changes have been reported from previous visit on 05/12/2022.  Objective: Physical Exam Not obtained as encounter was done via telephone.   Previous notes and tests were reviewed.  I discussed the assessment and treatment plan with the patient. The patient was provided an opportunity to ask questions and all were answered. The patient agreed with the plan and demonstrated an understanding of the instructions.   The patient was advised to call back or seek an in-person evaluation if the symptoms worsen or if the condition fails to improve as anticipated.  I provided 22 minutes of non-face-to-face time during this encounter.  It was my pleasure to participate in [place] care today. Please feel free to contact me with any questions or concerns.   Sincerely,  Gareth Morgan, FNP

## 2022-07-31 NOTE — Telephone Encounter (Signed)
On the schedule

## 2022-07-31 NOTE — Patient Instructions (Addendum)
Post COVID syndrome with urticarial rash - Let's hold the Xolair injection until around 08/18/2022. At that time, you can choose if you want to get the 300 mg dose or 375 mg dose - Increase cetirizine 10 mg to twice a day while itching or when you have hives - Continue Pepcid as you have been  Asthma Increase Flovent 110 to 2 puffs three times a day with a spacer for 1 week or until cough and wheeze free. You may pre-treat with albuterol 2 puffs about 5-15 minutes before using Flovent for the first few times.  Continue albuterol 2 puffs once every 4 hours as needed for cough or wheeze You may use albuterol 2 puffs 5-15 minutes before activity  Allergic rhinitis Continue cetirizine 10 mg once a day as needed for a runny nose or itch Continue Nasacort 2 sprays in each nostril once a day as needed for a stuffy nose Consider saline nasal rinses as needed for nasal symptoms. Use this before any medicated nasal sprays for best results  Call the clinic if this treatment plan is not working well for you  Follow up in 2 weeks or sooner if needed.

## 2022-08-02 ENCOUNTER — Encounter: Payer: Self-pay | Admitting: Family Medicine

## 2022-08-03 ENCOUNTER — Other Ambulatory Visit (HOSPITAL_COMMUNITY): Payer: Self-pay

## 2022-08-03 MED ORDER — EZETIMIBE 10 MG PO TABS
10.0000 mg | ORAL_TABLET | Freq: Every day | ORAL | 3 refills | Status: DC
  Filled 2022-08-03 – 2022-08-23 (×2): qty 90, 90d supply, fill #0

## 2022-08-04 ENCOUNTER — Ambulatory Visit: Payer: 59

## 2022-08-05 ENCOUNTER — Other Ambulatory Visit (HOSPITAL_COMMUNITY): Payer: Self-pay

## 2022-08-10 ENCOUNTER — Other Ambulatory Visit (HOSPITAL_COMMUNITY): Payer: Self-pay

## 2022-08-11 ENCOUNTER — Ambulatory Visit: Payer: 59

## 2022-08-12 ENCOUNTER — Ambulatory Visit: Payer: 59

## 2022-08-14 ENCOUNTER — Other Ambulatory Visit (HOSPITAL_COMMUNITY): Payer: Self-pay

## 2022-08-20 ENCOUNTER — Ambulatory Visit (INDEPENDENT_AMBULATORY_CARE_PROVIDER_SITE_OTHER): Payer: 59

## 2022-08-20 DIAGNOSIS — J4541 Moderate persistent asthma with (acute) exacerbation: Secondary | ICD-10-CM

## 2022-08-20 NOTE — Progress Notes (Signed)
Immunotherapy   Patient Details  Name: [name] MRN: [id number] Date of Birth: 08/10/1956  08/20/2022  Rachel Fry started injections for  Xolair 300 home administration  Following schedule: Every fourteen days Frequency: Every two weeks.  Epi-Pen:Epi-Pen Available  Consent signed previously and patient instructions given on how to administer into the thigh or abdomen. Patient administered both injections into both thighs. Patient waited in the lobby for a few minutes without an issue.    Rachel Fry 08/20/2022, 5:25 PM

## 2022-08-21 ENCOUNTER — Other Ambulatory Visit (HOSPITAL_COMMUNITY): Payer: Self-pay

## 2022-08-21 ENCOUNTER — Telehealth: Payer: Self-pay | Admitting: *Deleted

## 2022-08-21 MED ORDER — OMALIZUMAB 150 MG/ML ~~LOC~~ SOSY
300.0000 mg | PREFILLED_SYRINGE | SUBCUTANEOUS | 11 refills | Status: DC
  Filled 2022-08-21: qty 4, 28d supply, fill #0

## 2022-08-21 NOTE — Telephone Encounter (Signed)
Thanks, Tammy!   Salvatore Marvel, MD [facility]

## 2022-08-21 NOTE — Telephone Encounter (Signed)
-----   Message from Rachel Drape, LPN sent at 25/83/4621  5:11 PM EDT ----- Per Dr. Darnell Level patient is going to do 300 mg of Xolair every 2 weeks. Patient will self administer at home.

## 2022-08-21 NOTE — Telephone Encounter (Signed)
Called patient and advised new Rx with dosing instrux '300mg'$  every 14 and to admin at home sent to [facility] and she will just need to reach out to them directly for refills

## 2022-08-24 ENCOUNTER — Other Ambulatory Visit (HOSPITAL_COMMUNITY): Payer: Self-pay

## 2022-08-24 ENCOUNTER — Ambulatory Visit: Payer: 59

## 2022-08-25 ENCOUNTER — Ambulatory Visit: Payer: 59 | Attending: Internal Medicine

## 2022-08-25 ENCOUNTER — Other Ambulatory Visit (HOSPITAL_COMMUNITY): Payer: Self-pay

## 2022-08-25 DIAGNOSIS — M542 Cervicalgia: Secondary | ICD-10-CM | POA: Diagnosis not present

## 2022-08-25 DIAGNOSIS — M25511 Pain in right shoulder: Secondary | ICD-10-CM | POA: Diagnosis not present

## 2022-08-25 DIAGNOSIS — R293 Abnormal posture: Secondary | ICD-10-CM | POA: Diagnosis not present

## 2022-08-25 DIAGNOSIS — R519 Headache, unspecified: Secondary | ICD-10-CM | POA: Insufficient documentation

## 2022-08-25 DIAGNOSIS — G8929 Other chronic pain: Secondary | ICD-10-CM | POA: Diagnosis not present

## 2022-08-25 NOTE — Therapy (Signed)
OUTPATIENT PHYSICAL THERAPY TREATMENT NOTE/RECERT/Physical Therapy Progress Note   Dates of reporting period  02/17/22   to   08/25/22    Patient Name: Rachel Fry MRN: [id number] DOB:[birth year], 66 y.o., female Today's Date: 08/25/2022  PCP: [name] MD  REFERRING PROVIDER: [name] MD    PT End of Session - 08/25/22 1258     Visit Number 30    Number of Visits 36    Date for PT Re-Evaluation 11/17/22    PT Start Time 1300    PT Stop Time 1345    PT Time Calculation (min) 45 min    Activity Tolerance Patient tolerated treatment well    Behavior During Therapy WFL for tasks assessed/performed                  Past Medical History:  Diagnosis Date   Allergy    Asthma    COVID-19    History of kidney stones    Hypothyroidism    Migraines    Numbness and tingling    left side of body, occasionally right side    S/P Botox injection    Urticaria    Past Surgical History:  Procedure Laterality Date   ADENOIDECTOMY     ARM NEUROPLASTY Right 1994 and 1996   for chronic pain   CESAREAN SECTION     CYSTOSCOPY W/ RETROGRADES Left 04/23/2020   Procedure: CYSTOSCOPY WITH RETROGRADE PYELOGRAM;  Surgeon: Abbie Sons, MD;  Location: [facility];  Service: Urology;  Laterality: Left;   CYSTOSCOPY W/ RETROGRADES Left 05/13/2020   Procedure: CYSTOSCOPY WITH RETROGRADE PYELOGRAM;  Surgeon: Hollice Espy, MD;  Location: [facility];  Service: Urology;  Laterality: Left;   CYSTOSCOPY WITH STENT PLACEMENT Left 04/23/2020   Procedure: CYSTOSCOPY WITH STENT PLACEMENT;  Surgeon: Abbie Sons, MD;  Location: [facility];  Service: Urology;  Laterality: Left;   CYSTOSCOPY/URETEROSCOPY/HOLMIUM LASER/STENT PLACEMENT Left 05/13/2020   Procedure: CYSTOSCOPY/URETEROSCOPY/LASER/STENT EXCHANGE;  Surgeon: Hollice Espy, MD;  Location: [facility];  Service: Urology;  Laterality: Left;   OVARIAN CYST REMOVAL  1973   STONE EXTRACTION WITH BASKET  05/13/2020   Procedure:  STONE EXTRACTION WITH BASKET;  Surgeon: Hollice Espy, MD;  Location: [facility];  Service: Urology;;   [place]   Patient Active Problem List   Diagnosis Date Noted   Dry eyes 05/12/2022   Asthma 05/12/2022   Fibromyalgia 05/12/2022   Hyperlipidemia 05/12/2022   Osteoporosis 03/13/2021   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 hauler 12/25/2020   Chronic urticaria 12/25/2020   Moderate persistent asthma, uncomplicated 03/47/4259   Ageusia 08/12/2020   Anosmia 08/12/2020   Myalgia, epidemic 08/12/2020   Cough with exposure to COVID-19 virus 08/12/2020   Olfactory impairment 05/29/2020   Acute UTI 04/23/2020   Left ureteral stone 04/23/2020   Intermittent chest pain 02/09/2020   History of COVID-19 02/09/2020   Shortness of breath 02/09/2020   Loss of taste 02/09/2020   Loss of smell 02/09/2020   Rash 02/09/2020   Arthralgia 02/09/2020   Benign essential hypertension 10/25/2019   Gastroesophageal reflux disease without esophagitis 06/23/2019   Chronic migraine 06/23/2019   Neuropathy 11/17/2018   Atypical facial pain 06/30/2018   Hypothyroidism 05/19/2018   Postoperative history of checked last year 04/22/2017   Weakness of left arm 04/22/2017   Numbness and tingling in left arm 04/22/2017   Lower back pain 04/02/2016   Acute stress disorder 02/25/2015  Anxiety 02/25/2015   Airway hyperreactivity 02/25/2015   Bradycardia 02/25/2015   Hypersomnia 02/25/2015   Clinical depression 02/25/2015   Elevated blood sugar 02/25/2015   Fibrositis 02/25/2015   Acid reflux 02/25/2015   Hepatitis non A non B 02/25/2015   H/O disease 02/25/2015   Hypercholesteremia 02/25/2015   Headache, migraine 02/25/2015   Muscle ache 02/25/2015   Allergic rhinitis, seasonal 02/25/2015   Avitaminosis D 02/25/2015    REFERRING DIAG: Migraine  THERAPY DIAG:  Abnormal posture  Nonintractable headache, unspecified chronicity pattern, unspecified  headache type  Cervicalgia  Chronic right shoulder pain  Rationale for Evaluation and Treatment Rehabilitation  PERTINENT HISTORY: Patient is a 66 year old female who presents to physical therapy for migraines with specific referral for dry needling.  Patient has been having migraines for about 14-15 years. They have changed from front temporal all day long 2-3/10, a couple years ago started having tingling in left side of body, numbness in arm and leg. Additionally sometimes get's belly aches.  Changed medication in 2020. Was getting trigger injections but it was too expensive. PMH of HLD, aortic atherosclerosis, asthma, COVID 19, SOB, hypothyroidism, atypical facial pain (2019), neuropathy (2020), fibromyalgia   PRECAUTIONS: n/a  SUBJECTIVE: Patient missed last session due to therapist being ill. Patient has started her infusions last month, has noticed an increase in nausea.   PAIN:  Are you having pain? Yes: NPRS scale: 2/10 Pain location: head Pain description: migraine Aggravating factors: stress, tension Relieving factors: heat, rest     TODAY'S TREATMENT:    Goals: addressed below for further details.     Treatment Cervical side bend with overpressure to glenohumeral joint 5x 30 seconds Cervical rotation with overpressure to glenohumeral joint: 5x 30 seconds Suboccipital release 4x30 seconds J mobilization for postural kyphosis reduction 4x 30 seconds cervical and thoracic mobilizations grade II for reduced hypomobility x 6 minutes        Trigger Point Dry Needling (TDN), unbilled Education performed with patient regarding potential benefit of TDN. Reviewed precautions and risks with patient. Reviewed special precautions/risks over lung fields which include pneumothorax. Reviewed signs and symptoms of pneumothorax and advised pt to go to ER immediately if these symptoms develop advise them of dry needling treatment. Extensive time spent with pt to ensure full  understanding of TDN risks. Pt provided verbal consent to treatment. TDN performed to  with 0.30 x 30 and .2 x 0.3 single needle placements with local twitch response (LTR). Pistoning technique utilized. Improved pain-free motion following intervention. L and Rcervical paraspinals, Upper trap (L and R), temporalis, suboccipital musculature  x  19 minutes       PATIENT EDUCATION: Education details: exercise technique, TDN Person educated: Patient Education method: Explanation, Demonstration, Tactile cues, and Verbal cues Education comprehension: verbalized understanding, returned demonstration, verbal cues required, and tactile cues required   HOME EXERCISE PROGRAM: See prior sessions    PT Short Term Goals      PT SHORT TERM GOAL #1   Title Pt will be independent with HEP in order to decrease pain in order to improve pain-free function at home and work.    Baseline 6/7: HEP given 9/1: HEp compliant 11/15: HEP compliant    Time 6    Period Weeks    Status Achieved    Target Date 08/07/21              PT Long Term Goals -      PT LONG TERM GOAL #1  Title Patient will increase FOTO score to equal to or greater than 59%    to demonstrate statistically significant improvement in mobility and quality of life.    Baseline 6/7: 50% 9/1: 59% 11/15: 86% 2/14: 71%    Time 12    Period Weeks    Status Achieved      PT LONG TERM GOAL #2   Title Pt will decrease worst pain as reported on NPRS by at least 3 points (3/10) in order to demonstrate clinically significant reduction in pain.    Baseline 6/7: 6/10 9/1: 4/10 11/15: 5/10 2/14: 3-4/10 4/25: 6/10 10/31: 5/10   Time 12    Period Weeks    Status Partially Met    Target Date 11/17/2022       PT LONG TERM GOAL #3   Title Pt will demonstrate pain-free full range cervical motion in order to perform IADLs such as driving and household chores without increase in symptoms.    Baseline 6/7: see note for limitation 9/1:flexion 54,  extension 60, SB R 35 L 30, rotation R 51 L 62 11/15: flexion 58, extension 60, SB R and L 35, R rotation 59 L rotation 60 2/14: flexion 70, extension 60 SB R and L 38, R rotation 59 L 64 4/25: see note 10/31: unable to find tool for ROM assessment, will perform next session.    Time 12    Period Weeks    Status Partially Met    Target Date 11/17/2022       PT LONG TERM GOAL #4   Title Patient will have less than two days a week of high pain migraines and demonstrate ability to self control/contain migraines.    Baseline 9/1:every day getting the mild ones, 2x/week for high pain migraines. 11/15: two days of high pain in past month, has mini headaches every day 2/14: has worsening headaches in the evening but only 2-3 days of high pain 4/25: 2 days of high pain 10/31: 1-2 days a week    Time 12    Period Weeks    Status Partially Met    Target Date 11/17/2022       PT LONG TERM GOAL #5   Title Patient will reduce Neck Disability Index score to <10% to demonstrate minimal disability with ADL's including improved sleeping tolerance, sitting tolerance, etc for better mobility at home and work.    Baseline 2/14: 18% 4/25: 16% 10/31: 16%   Time 12    Period Weeks    Status Partially Met    Target Date 11/17/2022     PT LONG TERM GOAL #6  Title Patient will tolerate three hours in clinic with fluorescent lights with pain increase of <3/10 for improved quality of life with work   Baseline 10/31: pain worsens with fluorescent lights.   Time 12   Period Weeks   Status New  Target Date 11/17/2022              Plan     Clinical Impression Statement Patient's goals assessed with patient demonstrating functional progress with decreased pain and frequency of high pain days. New goal addressing fluorescent lightbulbs pain increase added to POC with patient agreeing. Will assess cervical ROM next session.  Patient continues to benefit from pain reduction with TDN and decreased migraine  intensity. Patient's condition has the potential to improve in response to therapy. Maximum improvement is yet to be obtained. The anticipated improvement is attainable and reasonable in a generally predictable  time.  She will benefit from skilled physical therapy to reduce pain, improve patient independence with symptom management, and improve quality of life    Personal Factors and Comorbidities Age;Behavior Pattern;Comorbidity 3+;Fitness;Past/Current Experience;Profession;Time since onset of injury/illness/exacerbation    Comorbidities HLD, aortic atherosclerosis, asthma, COVID 19, SOB, hypothyroidism, atypical facial pain (2019), neuropathy (2020), fibromyalgia    Examination-Activity Limitations Caring for Others;Carry;Lift;Sleep;Transfers    Examination-Participation Restrictions Cleaning;Community Activity;Interpersonal Relationship;Driving;Laundry;Shop;Occupation;Meal Prep;Yard Work    [employer] Evolving/Moderate complexity    Rehab Potential Fair    PT Frequency Biweekly    PT Duration 12 weeks    PT Treatment/Interventions ADLs/Self Care Home Management;Dry needling;Manual techniques;Therapeutic exercise;Therapeutic activities;Neuromuscular re-education;Aquatic Therapy;Biofeedback;Canalith Repostioning;Cryotherapy;Electrical Stimulation;Iontophoresis 62m/ml Dexamethasone;Moist Heat;Ultrasound;Patient/family education;Passive range of motion;Energy conservation;Vestibular;Joint Manipulations;Traction;Gait training;Balance training;Taping;Spinal Manipulations;Visual/perceptual remediation/compensation    PT Next Visit Plan exercise technique, body mechanics    Consulted and Agree with Plan of Care Patient              MJanna Arch PT 08/25/2022, 2:00 PM

## 2022-08-26 ENCOUNTER — Ambulatory Visit: Payer: 59

## 2022-09-01 ENCOUNTER — Other Ambulatory Visit (HOSPITAL_COMMUNITY): Payer: Self-pay

## 2022-09-03 DIAGNOSIS — H35373 Puckering of macula, bilateral: Secondary | ICD-10-CM | POA: Diagnosis not present

## 2022-09-08 ENCOUNTER — Other Ambulatory Visit (HOSPITAL_COMMUNITY): Payer: Self-pay

## 2022-09-09 ENCOUNTER — Ambulatory Visit: Payer: 59

## 2022-09-10 ENCOUNTER — Ambulatory Visit: Payer: 59 | Attending: Internal Medicine

## 2022-09-10 ENCOUNTER — Other Ambulatory Visit (HOSPITAL_COMMUNITY): Payer: Self-pay

## 2022-09-10 ENCOUNTER — Other Ambulatory Visit: Payer: Self-pay | Admitting: Pharmacist

## 2022-09-10 DIAGNOSIS — M542 Cervicalgia: Secondary | ICD-10-CM | POA: Insufficient documentation

## 2022-09-10 DIAGNOSIS — R293 Abnormal posture: Secondary | ICD-10-CM | POA: Insufficient documentation

## 2022-09-10 DIAGNOSIS — R519 Headache, unspecified: Secondary | ICD-10-CM | POA: Diagnosis not present

## 2022-09-10 DIAGNOSIS — M25511 Pain in right shoulder: Secondary | ICD-10-CM | POA: Insufficient documentation

## 2022-09-10 DIAGNOSIS — G8929 Other chronic pain: Secondary | ICD-10-CM | POA: Insufficient documentation

## 2022-09-10 MED ORDER — OMALIZUMAB 150 MG/ML ~~LOC~~ SOSY
300.0000 mg | PREFILLED_SYRINGE | SUBCUTANEOUS | 11 refills | Status: DC
  Filled 2022-09-10: qty 4, 28d supply, fill #0
  Filled 2022-10-15: qty 4, 28d supply, fill #1
  Filled 2022-11-12: qty 4, 28d supply, fill #2
  Filled 2022-12-09: qty 4, 28d supply, fill #3
  Filled 2023-01-08: qty 4, 28d supply, fill #4
  Filled 2023-02-16: qty 4, 28d supply, fill #5
  Filled 2023-03-13 – 2023-03-16 (×2): qty 4, 28d supply, fill #6
  Filled 2023-04-12: qty 4, 28d supply, fill #7
  Filled 2023-05-13: qty 4, 28d supply, fill #8
  Filled 2023-06-10: qty 4, 28d supply, fill #9
  Filled 2023-07-07: qty 4, 28d supply, fill #10
  Filled 2023-07-31 – 2023-08-10 (×2): qty 4, 28d supply, fill #11

## 2022-09-10 NOTE — Therapy (Signed)
OUTPATIENT PHYSICAL THERAPY TREATMENT NOTE   Patient Name: Rachel Fry MRN: [id number] DOB:12-Apr-1956, 66 y.o., female Today's Date: 09/10/2022  PCP: [name] MD  REFERRING PROVIDER: [name] MD    PT End of Session - 09/10/22 1301     Visit Number 31    Number of Visits 36    Date for PT Re-Evaluation 11/17/22    PT Start Time 1301    PT Stop Time 1345    PT Time Calculation (min) 44 min    Activity Tolerance Patient tolerated treatment well    Behavior During Therapy WFL for tasks assessed/performed                  Past Medical History:  Diagnosis Date   Allergy    Asthma    COVID-19    History of kidney stones    Hypothyroidism    Migraines    Numbness and tingling    left side of body, occasionally right side    S/P Botox injection    Urticaria    Past Surgical History:  Procedure Laterality Date   ADENOIDECTOMY     ARM NEUROPLASTY Right 1994 and 1996   for chronic pain   CESAREAN SECTION     CYSTOSCOPY W/ RETROGRADES Left 04/23/2020   Procedure: CYSTOSCOPY WITH RETROGRADE PYELOGRAM;  Surgeon: Abbie Sons, MD;  Location: [facility];  Service: Urology;  Laterality: Left;   CYSTOSCOPY W/ RETROGRADES Left 05/13/2020   Procedure: CYSTOSCOPY WITH RETROGRADE PYELOGRAM;  Surgeon: Hollice Espy, MD;  Location: [facility];  Service: Urology;  Laterality: Left;   CYSTOSCOPY WITH STENT PLACEMENT Left 04/23/2020   Procedure: CYSTOSCOPY WITH STENT PLACEMENT;  Surgeon: Abbie Sons, MD;  Location: [facility];  Service: Urology;  Laterality: Left;   CYSTOSCOPY/URETEROSCOPY/HOLMIUM LASER/STENT PLACEMENT Left 05/13/2020   Procedure: CYSTOSCOPY/URETEROSCOPY/LASER/STENT EXCHANGE;  Surgeon: Hollice Espy, MD;  Location: [facility];  Service: Urology;  Laterality: Left;   OVARIAN CYST REMOVAL  1973   STONE EXTRACTION WITH BASKET  05/13/2020   Procedure: STONE EXTRACTION WITH BASKET;  Surgeon: Hollice Espy, MD;  Location: [facility];  Service:  Urology;;   [place]   Patient Active Problem List   Diagnosis Date Noted   Dry eyes 05/12/2022   Asthma 05/12/2022   Fibromyalgia 05/12/2022   Hyperlipidemia 05/12/2022   Osteoporosis 03/13/2021   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 hauler 12/25/2020   Chronic urticaria 12/25/2020   Moderate persistent asthma, uncomplicated 29/93/7169   Ageusia 08/12/2020   Anosmia 08/12/2020   Myalgia, epidemic 08/12/2020   Cough with exposure to COVID-19 virus 08/12/2020   Olfactory impairment 05/29/2020   Acute UTI 04/23/2020   Left ureteral stone 04/23/2020   Intermittent chest pain 02/09/2020   History of COVID-19 02/09/2020   Shortness of breath 02/09/2020   Loss of taste 02/09/2020   Loss of smell 02/09/2020   Rash 02/09/2020   Arthralgia 02/09/2020   Benign essential hypertension 10/25/2019   Gastroesophageal reflux disease without esophagitis 06/23/2019   Chronic migraine 06/23/2019   Neuropathy 11/17/2018   Atypical facial pain 06/30/2018   Hypothyroidism 05/19/2018   Postoperative history of checked last year 04/22/2017   Weakness of left arm 04/22/2017   Numbness and tingling in left arm 04/22/2017   Lower back pain 04/02/2016   Acute stress disorder 02/25/2015   Anxiety 02/25/2015   Airway hyperreactivity 02/25/2015   Bradycardia 02/25/2015   Hypersomnia 02/25/2015  Clinical depression 02/25/2015   Elevated blood sugar 02/25/2015   Fibrositis 02/25/2015   Acid reflux 02/25/2015   Hepatitis non A non B 02/25/2015   H/O disease 02/25/2015   Hypercholesteremia 02/25/2015   Headache, migraine 02/25/2015   Muscle ache 02/25/2015   Allergic rhinitis, seasonal 02/25/2015   Avitaminosis D 02/25/2015    REFERRING DIAG: Migraine  THERAPY DIAG:  Abnormal posture  Nonintractable headache, unspecified chronicity pattern, unspecified headache type  Cervicalgia  Chronic right shoulder pain  Rationale for Evaluation and  Treatment Rehabilitation  PERTINENT HISTORY: Patient is a 66 year old female who presents to physical therapy for migraines with specific referral for dry needling.  Patient has been having migraines for about 14-15 years. They have changed from front temporal all day long 2-3/10, a couple years ago started having tingling in left side of body, numbness in arm and leg. Additionally sometimes get's belly aches.  Changed medication in 2020. Was getting trigger injections but it was too expensive. PMH of HLD, aortic atherosclerosis, asthma, COVID 19, SOB, hypothyroidism, atypical facial pain (2019), neuropathy (2020), fibromyalgia   PRECAUTIONS: n/a  SUBJECTIVE: Patient reports she has been having migraines shooting to behind her eyes.   PAIN:  Are you having pain? Yes: NPRS scale: 2/10 Pain location: head Pain description: migraine Aggravating factors: stress, tension Relieving factors: heat, rest     TODAY'S TREATMENT:       Treatment Cervical side bend with overpressure to glenohumeral joint 5x 30 seconds Cervical rotation with overpressure to glenohumeral joint: 5x 30 seconds Suboccipital release 4x30 seconds J mobilization for postural kyphosis reduction 4x 30 seconds cervical and thoracic mobilizations grade II for reduced hypomobility x 6 minutes  BTB Y 15x Robber stretch 30 seconds Arnold to robber stretch 10x10 hold  BTB ER 10x        Trigger Point Dry Needling (TDN), unbilled Education performed with patient regarding potential benefit of TDN. Reviewed precautions and risks with patient. Reviewed special precautions/risks over lung fields which include pneumothorax. Reviewed signs and symptoms of pneumothorax and advised pt to go to ER immediately if these symptoms develop advise them of dry needling treatment. Extensive time spent with pt to ensure full understanding of TDN risks. Pt provided verbal consent to treatment. TDN performed to  with 0.30 x 30 and .2 x 0.3  single needle placements with local twitch response (LTR). Pistoning technique utilized. Improved pain-free motion following intervention. L and Rcervical paraspinals, Upper trap (L and R), temporalis, suboccipital musculature  x  10 minutes       PATIENT EDUCATION: Education details: exercise technique, TDN Person educated: Patient Education method: Explanation, Demonstration, Tactile cues, and Verbal cues Education comprehension: verbalized understanding, returned demonstration, verbal cues required, and tactile cues required   HOME EXERCISE PROGRAM: See prior sessions    PT Short Term Goals      PT SHORT TERM GOAL #1   Title Pt will be independent with HEP in order to decrease pain in order to improve pain-free function at home and work.    Baseline 6/7: HEP given 9/1: HEp compliant 11/15: HEP compliant    Time 6    Period Weeks    Status Achieved    Target Date 08/07/21              PT Long Term Goals -      PT LONG TERM GOAL #1   Title Patient will increase FOTO score to equal to or greater than 59%      to demonstrate statistically significant improvement in mobility and quality of life.    Baseline 6/7: 50% 9/1: 59% 11/15: 86% 2/14: 71%    Time 12    Period Weeks    Status Achieved      PT LONG TERM GOAL #2   Title Pt will decrease worst pain as reported on NPRS by at least 3 points (3/10) in order to demonstrate clinically significant reduction in pain.    Baseline 6/7: 6/10 9/1: 4/10 11/15: 5/10 2/14: 3-4/10 4/25: 6/10 10/31: 5/10   Time 12    Period Weeks    Status Partially Met    Target Date 11/17/2022       PT LONG TERM GOAL #3   Title Pt will demonstrate pain-free full range cervical motion in order to perform IADLs such as driving and household chores without increase in symptoms.    Baseline 6/7: see note for limitation 9/1:flexion 54, extension 60, SB R 35 L 30, rotation R 51 L 62 11/15: flexion 58, extension 60, SB R and L 35, R rotation 59 L  rotation 60 2/14: flexion 70, extension 60 SB R and L 38, R rotation 59 L 64 4/25: see note 10/31: unable to find tool for ROM assessment, will perform next session.    Time 12    Period Weeks    Status Partially Met    Target Date 11/17/2022       PT LONG TERM GOAL #4   Title Patient will have less than two days a week of high pain migraines and demonstrate ability to self control/contain migraines.    Baseline 9/1:every day getting the mild ones, 2x/week for high pain migraines. 11/15: two days of high pain in past month, has mini headaches every day 2/14: has worsening headaches in the evening but only 2-3 days of high pain 4/25: 2 days of high pain 10/31: 1-2 days a week    Time 12    Period Weeks    Status Partially Met    Target Date 11/17/2022       PT LONG TERM GOAL #5   Title Patient will reduce Neck Disability Index score to <10% to demonstrate minimal disability with ADL's including improved sleeping tolerance, sitting tolerance, etc for better mobility at home and work.    Baseline 2/14: 18% 4/25: 16% 10/31: 16%   Time 12    Period Weeks    Status Partially Met    Target Date 11/17/2022     PT LONG TERM GOAL #6  Title Patient will tolerate three hours in clinic with fluorescent lights with pain increase of <3/10 for improved quality of life with work   Baseline 10/31: pain worsens with fluorescent lights.   Time 12   Period Weeks   Status New  Target Date 11/17/2022              Plan     Clinical Impression Statement Patient has continued trigger point sin upper trap and suboccipital with R>L. Postural interventions tolerated well with no pain increase. Utilization of blue band tolerated well indicating improved strength.  She will benefit from skilled physical therapy to reduce pain, improve patient independence with symptom management, and improve quality of life    Personal Factors and Comorbidities Age;Behavior Pattern;Comorbidity 3+;Fitness;Past/Current  Experience;Profession;Time since onset of injury/illness/exacerbation    Comorbidities HLD, aortic atherosclerosis, asthma, COVID 19, SOB, hypothyroidism, atypical facial pain (2019), neuropathy (2020), fibromyalgia    Examination-Activity Limitations Caring for Others;Carry;Lift;Sleep;Transfers      Examination-Participation Restrictions Cleaning;Community Activity;Interpersonal Relationship;Driving;Laundry;Shop;Occupation;Meal Prep;Yard Work    Stability/Clinical Decision Making Evolving/Moderate complexity    Rehab Potential Fair    PT Frequency Biweekly    PT Duration 12 weeks    PT Treatment/Interventions ADLs/Self Care Home Management;Dry needling;Manual techniques;Therapeutic exercise;Therapeutic activities;Neuromuscular re-education;Aquatic Therapy;Biofeedback;Canalith Repostioning;Cryotherapy;Electrical Stimulation;Iontophoresis 4mg/ml Dexamethasone;Moist Heat;Ultrasound;Patient/family education;Passive range of motion;Energy conservation;Vestibular;Joint Manipulations;Traction;Gait training;Balance training;Taping;Spinal Manipulations;Visual/perceptual remediation/compensation    PT Next Visit Plan exercise technique, body mechanics    Consulted and Agree with Plan of Care Patient              Marina Moser, PT 09/10/2022, 1:49 PM    

## 2022-09-20 ENCOUNTER — Other Ambulatory Visit (HOSPITAL_COMMUNITY): Payer: Self-pay

## 2022-09-21 ENCOUNTER — Other Ambulatory Visit (HOSPITAL_COMMUNITY): Payer: Self-pay

## 2022-09-22 ENCOUNTER — Other Ambulatory Visit (HOSPITAL_COMMUNITY): Payer: Self-pay

## 2022-09-23 ENCOUNTER — Ambulatory Visit: Payer: 59

## 2022-09-23 ENCOUNTER — Other Ambulatory Visit (HOSPITAL_COMMUNITY): Payer: Self-pay

## 2022-09-23 NOTE — Therapy (Signed)
OUTPATIENT PHYSICAL THERAPY TREATMENT NOTE   Patient Name: Rachel Fry MRN: [id number] DOB:06/18/1956, 66 y.o., female Today's Date: 09/24/2022  PCP: [name] MD  REFERRING PROVIDER: [name] MD    PT End of Session - 09/24/22 1258     Visit Number 32    Number of Visits 36    Date for PT Re-Evaluation 11/17/22    PT Start Time 1300    PT Stop Time 1344    PT Time Calculation (min) 44 min    Activity Tolerance Patient tolerated treatment well    Behavior During Therapy WFL for tasks assessed/performed                   Past Medical History:  Diagnosis Date   Allergy    Asthma    COVID-19    History of kidney stones    Hypothyroidism    Migraines    Numbness and tingling    left side of body, occasionally right side    S/P Botox injection    Urticaria    Past Surgical History:  Procedure Laterality Date   ADENOIDECTOMY     ARM NEUROPLASTY Right 1994 and 1996   for chronic pain   CESAREAN SECTION     CYSTOSCOPY W/ RETROGRADES Left 04/23/2020   Procedure: CYSTOSCOPY WITH RETROGRADE PYELOGRAM;  Surgeon: Abbie Sons, MD;  Location: [facility];  Service: Urology;  Laterality: Left;   CYSTOSCOPY W/ RETROGRADES Left 05/13/2020   Procedure: CYSTOSCOPY WITH RETROGRADE PYELOGRAM;  Surgeon: Hollice Espy, MD;  Location: [facility];  Service: Urology;  Laterality: Left;   CYSTOSCOPY WITH STENT PLACEMENT Left 04/23/2020   Procedure: CYSTOSCOPY WITH STENT PLACEMENT;  Surgeon: Abbie Sons, MD;  Location: [facility];  Service: Urology;  Laterality: Left;   CYSTOSCOPY/URETEROSCOPY/HOLMIUM LASER/STENT PLACEMENT Left 05/13/2020   Procedure: CYSTOSCOPY/URETEROSCOPY/LASER/STENT EXCHANGE;  Surgeon: Hollice Espy, MD;  Location: [facility];  Service: Urology;  Laterality: Left;   OVARIAN CYST REMOVAL  1973   STONE EXTRACTION WITH BASKET  05/13/2020   Procedure: STONE EXTRACTION WITH BASKET;  Surgeon: Hollice Espy, MD;  Location: [facility];  Service:  Urology;;   [place]   Patient Active Problem List   Diagnosis Date Noted   Dry eyes 05/12/2022   Asthma 05/12/2022   Fibromyalgia 05/12/2022   Hyperlipidemia 05/12/2022   Osteoporosis 03/13/2021   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 hauler 12/25/2020   Chronic urticaria 12/25/2020   Moderate persistent asthma, uncomplicated 76/54/6503   Ageusia 08/12/2020   Anosmia 08/12/2020   Myalgia, epidemic 08/12/2020   Cough with exposure to COVID-19 virus 08/12/2020   Olfactory impairment 05/29/2020   Acute UTI 04/23/2020   Left ureteral stone 04/23/2020   Intermittent chest pain 02/09/2020   History of COVID-19 02/09/2020   Shortness of breath 02/09/2020   Loss of taste 02/09/2020   Loss of smell 02/09/2020   Rash 02/09/2020   Arthralgia 02/09/2020   Benign essential hypertension 10/25/2019   Gastroesophageal reflux disease without esophagitis 06/23/2019   Chronic migraine 06/23/2019   Neuropathy 11/17/2018   Atypical facial pain 06/30/2018   Hypothyroidism 05/19/2018   Postoperative history of checked last year 04/22/2017   Weakness of left arm 04/22/2017   Numbness and tingling in left arm 04/22/2017   Lower back pain 04/02/2016   Acute stress disorder 02/25/2015   Anxiety 02/25/2015   Airway hyperreactivity 02/25/2015   Bradycardia 02/25/2015   Hypersomnia 02/25/2015  Clinical depression 02/25/2015   Elevated blood sugar 02/25/2015   Fibrositis 02/25/2015   Acid reflux 02/25/2015   Hepatitis non A non B 02/25/2015   H/O disease 02/25/2015   Hypercholesteremia 02/25/2015   Headache, migraine 02/25/2015   Muscle ache 02/25/2015   Allergic rhinitis, seasonal 02/25/2015   Avitaminosis D 02/25/2015    REFERRING DIAG: Migraine  THERAPY DIAG:  Abnormal posture  Nonintractable headache, unspecified chronicity pattern, unspecified headache type  Cervicalgia  Chronic right shoulder pain  Rationale for Evaluation and  Treatment Rehabilitation  PERTINENT HISTORY: Patient is a 66 year old female who presents to physical therapy for migraines with specific referral for dry needling.  Patient has been having migraines for about 14-15 years. They have changed from front temporal all day long 2-3/10, a couple years ago started having tingling in left side of body, numbness in arm and leg. Additionally sometimes get's belly aches.  Changed medication in 2020. Was getting trigger injections but it was too expensive. PMH of HLD, aortic atherosclerosis, asthma, COVID 19, SOB, hypothyroidism, atypical facial pain (2019), neuropathy (2020), fibromyalgia   PRECAUTIONS: n/a  SUBJECTIVE: Patient reports she has been having a radiating pain to her yes. Has been taking her medication for it. Has been a steady disruptive migraine. Has started having minor L sided weakness.  PAIN:  Are you having pain? Yes: NPRS scale: 2/10 Pain location: head Pain description: migraine Aggravating factors: stress, tension Relieving factors: heat, rest     TODAY'S TREATMENT:       Treatment Cervical side bend with overpressure to glenohumeral joint 5x 30 seconds Cervical rotation with overpressure to glenohumeral joint: 5x 30 seconds Suboccipital release 4x30 seconds J mobilization for postural kyphosis reduction 4x 30 seconds cervical and thoracic mobilizations grade II for reduced hypomobility x 6 minutes  Y overhead RTB 15x Chin tuck 10x  Scapular retraction with cervical rotation hold 3 seconds 10x each side      Trigger Point Dry Needling (TDN), unbilled Education performed with patient regarding potential benefit of TDN. Reviewed precautions and risks with patient. Reviewed special precautions/risks over lung fields which include pneumothorax. Reviewed signs and symptoms of pneumothorax and advised pt to go to ER immediately if these symptoms develop advise them of dry needling treatment. Extensive time spent with pt to  ensure full understanding of TDN risks. Pt provided verbal consent to treatment. TDN performed to  with 0.30 x 30 and .2 x 0.3 single needle placements with local twitch response (LTR). Pistoning technique utilized. Improved pain-free motion following intervention. L and Rcervical paraspinals, Upper trap (L and R), temporalis, suboccipital musculature  x  10 minutes       PATIENT EDUCATION: Education details: exercise technique, TDN Person educated: Patient Education method: Explanation, Demonstration, Tactile cues, and Verbal cues Education comprehension: verbalized understanding, returned demonstration, verbal cues required, and tactile cues required   HOME EXERCISE PROGRAM: See prior sessions    PT Short Term Goals      PT SHORT TERM GOAL #1   Title Pt will be independent with HEP in order to decrease pain in order to improve pain-free function at home and work.    Baseline 6/7: HEP given 9/1: HEp compliant 11/15: HEP compliant    Time 6    Period Weeks    Status Achieved    Target Date 08/07/21              PT Long Term Goals -      PT LONG TERM  GOAL #1   Title Patient will increase FOTO score to equal to or greater than 59%    to demonstrate statistically significant improvement in mobility and quality of life.    Baseline 6/7: 50% 9/1: 59% 11/15: 86% 2/14: 71%    Time 12    Period Weeks    Status Achieved      PT LONG TERM GOAL #2   Title Pt will decrease worst pain as reported on NPRS by at least 3 points (3/10) in order to demonstrate clinically significant reduction in pain.    Baseline 6/7: 6/10 9/1: 4/10 11/15: 5/10 2/14: 3-4/10 4/25: 6/10 10/31: 5/10   Time 12    Period Weeks    Status Partially Met    Target Date 11/17/2022       PT LONG TERM GOAL #3   Title Pt will demonstrate pain-free full range cervical motion in order to perform IADLs such as driving and household chores without increase in symptoms.    Baseline 6/7: see note for limitation  9/1:flexion 54, extension 60, SB R 35 L 30, rotation R 51 L 62 11/15: flexion 58, extension 60, SB R and L 35, R rotation 59 L rotation 60 2/14: flexion 70, extension 60 SB R and L 38, R rotation 59 L 64 4/25: see note 10/31: unable to find tool for ROM assessment, will perform next session.    Time 12    Period Weeks    Status Partially Met    Target Date 11/17/2022       PT LONG TERM GOAL #4   Title Patient will have less than two days a week of high pain migraines and demonstrate ability to self control/contain migraines.    Baseline 9/1:every day getting the mild ones, 2x/week for high pain migraines. 11/15: two days of high pain in past month, has mini headaches every day 2/14: has worsening headaches in the evening but only 2-3 days of high pain 4/25: 2 days of high pain 10/31: 1-2 days a week    Time 12    Period Weeks    Status Partially Met    Target Date 11/17/2022       PT LONG TERM GOAL #5   Title Patient will reduce Neck Disability Index score to <10% to demonstrate minimal disability with ADL's including improved sleeping tolerance, sitting tolerance, etc for better mobility at home and work.    Baseline 2/14: 18% 4/25: 16% 10/31: 16%   Time 12    Period Weeks    Status Partially Met    Target Date 11/17/2022     PT LONG TERM GOAL #6  Title Patient will tolerate three hours in clinic with fluorescent lights with pain increase of <3/10 for improved quality of life with work   Baseline 10/31: pain worsens with fluorescent lights.   Time 12   Period Weeks   Status New  Target Date 11/17/2022              Plan     Clinical Impression Statement Patient has significant trigger points of upper traps and suboccipital region. She has multiple cavitations of thoracic spine with manual. Patient performs chin tucks without need for correction of form this session.  She will benefit from skilled physical therapy to reduce pain, improve patient independence with symptom  management, and improve quality of life    Personal Factors and Comorbidities Age;Behavior Pattern;Comorbidity 3+;Fitness;Past/Current Experience;Profession;Time since onset of injury/illness/exacerbation    Comorbidities HLD, aortic  atherosclerosis, asthma, COVID 19, SOB, hypothyroidism, atypical facial pain (2019), neuropathy (2020), fibromyalgia    Examination-Activity Limitations Caring for Others;Carry;Lift;Sleep;Transfers    Examination-Participation Restrictions Cleaning;Community Activity;Interpersonal Relationship;Driving;Laundry;Shop;Occupation;Meal Prep;Yard Work    [employer] Evolving/Moderate complexity    Rehab Potential Fair    PT Frequency Biweekly    PT Duration 12 weeks    PT Treatment/Interventions ADLs/Self Care Home Management;Dry needling;Manual techniques;Therapeutic exercise;Therapeutic activities;Neuromuscular re-education;Aquatic Therapy;Biofeedback;Canalith Repostioning;Cryotherapy;Electrical Stimulation;Iontophoresis 11m/ml Dexamethasone;Moist Heat;Ultrasound;Patient/family education;Passive range of motion;Energy conservation;Vestibular;Joint Manipulations;Traction;Gait training;Balance training;Taping;Spinal Manipulations;Visual/perceptual remediation/compensation    PT Next Visit Plan exercise technique, body mechanics    Consulted and Agree with Plan of Care Patient              MJanna Arch PT 09/24/2022, 1:44 PM

## 2022-09-24 ENCOUNTER — Ambulatory Visit: Payer: 59

## 2022-09-24 DIAGNOSIS — G8929 Other chronic pain: Secondary | ICD-10-CM | POA: Diagnosis not present

## 2022-09-24 DIAGNOSIS — M542 Cervicalgia: Secondary | ICD-10-CM | POA: Diagnosis not present

## 2022-09-24 DIAGNOSIS — R519 Headache, unspecified: Secondary | ICD-10-CM | POA: Diagnosis not present

## 2022-09-24 DIAGNOSIS — R293 Abnormal posture: Secondary | ICD-10-CM | POA: Diagnosis not present

## 2022-09-24 DIAGNOSIS — M25511 Pain in right shoulder: Secondary | ICD-10-CM | POA: Diagnosis not present

## 2022-10-04 ENCOUNTER — Other Ambulatory Visit (HOSPITAL_COMMUNITY): Payer: Self-pay

## 2022-10-05 ENCOUNTER — Other Ambulatory Visit (HOSPITAL_COMMUNITY): Payer: Self-pay

## 2022-10-06 ENCOUNTER — Other Ambulatory Visit (HOSPITAL_COMMUNITY): Payer: Self-pay

## 2022-10-07 NOTE — Therapy (Signed)
OUTPATIENT PHYSICAL THERAPY TREATMENT NOTE   Patient Name: Rachel Fry MRN: [id number] DOB:06-Sep-1956, 66 y.o., female Today's Date: 10/08/2022  PCP: [name] MD  REFERRING PROVIDER: [name] MD    PT End of Session - 10/08/22 1300     Visit Number 33    Number of Visits 36    Date for PT Re-Evaluation 11/17/22    PT Start Time 1300    PT Stop Time 1344    PT Time Calculation (min) 44 min    Activity Tolerance Patient tolerated treatment well    Behavior During Therapy WFL for tasks assessed/performed                    Past Medical History:  Diagnosis Date   Allergy    Asthma    COVID-19    History of kidney stones    Hypothyroidism    Migraines    Numbness and tingling    left side of body, occasionally right side    S/P Botox injection    Urticaria    Past Surgical History:  Procedure Laterality Date   ADENOIDECTOMY     ARM NEUROPLASTY Right 1994 and 1996   for chronic pain   CESAREAN SECTION     CYSTOSCOPY W/ RETROGRADES Left 04/23/2020   Procedure: CYSTOSCOPY WITH RETROGRADE PYELOGRAM;  Surgeon: Abbie Sons, MD;  Location: [facility];  Service: Urology;  Laterality: Left;   CYSTOSCOPY W/ RETROGRADES Left 05/13/2020   Procedure: CYSTOSCOPY WITH RETROGRADE PYELOGRAM;  Surgeon: Hollice Espy, MD;  Location: [facility];  Service: Urology;  Laterality: Left;   CYSTOSCOPY WITH STENT PLACEMENT Left 04/23/2020   Procedure: CYSTOSCOPY WITH STENT PLACEMENT;  Surgeon: Abbie Sons, MD;  Location: [facility];  Service: Urology;  Laterality: Left;   CYSTOSCOPY/URETEROSCOPY/HOLMIUM LASER/STENT PLACEMENT Left 05/13/2020   Procedure: CYSTOSCOPY/URETEROSCOPY/LASER/STENT EXCHANGE;  Surgeon: Hollice Espy, MD;  Location: [facility];  Service: Urology;  Laterality: Left;   OVARIAN CYST REMOVAL  1973   STONE EXTRACTION WITH BASKET  05/13/2020   Procedure: STONE EXTRACTION WITH BASKET;  Surgeon: Hollice Espy, MD;  Location: [facility];  Service:  Urology;;   [place]   Patient Active Problem List   Diagnosis Date Noted   Dry eyes 05/12/2022   Asthma 05/12/2022   Fibromyalgia 05/12/2022   Hyperlipidemia 05/12/2022   Osteoporosis 03/13/2021   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 hauler 12/25/2020   Chronic urticaria 12/25/2020   Moderate persistent asthma, uncomplicated 23/76/2831   Ageusia 08/12/2020   Anosmia 08/12/2020   Myalgia, epidemic 08/12/2020   Cough with exposure to COVID-19 virus 08/12/2020   Olfactory impairment 05/29/2020   Acute UTI 04/23/2020   Left ureteral stone 04/23/2020   Intermittent chest pain 02/09/2020   History of COVID-19 02/09/2020   Shortness of breath 02/09/2020   Loss of taste 02/09/2020   Loss of smell 02/09/2020   Rash 02/09/2020   Arthralgia 02/09/2020   Benign essential hypertension 10/25/2019   Gastroesophageal reflux disease without esophagitis 06/23/2019   Chronic migraine 06/23/2019   Neuropathy 11/17/2018   Atypical facial pain 06/30/2018   Hypothyroidism 05/19/2018   Postoperative history of checked last year 04/22/2017   Weakness of left arm 04/22/2017   Numbness and tingling in left arm 04/22/2017   Lower back pain 04/02/2016   Acute stress disorder 02/25/2015   Anxiety 02/25/2015   Airway hyperreactivity 02/25/2015   Bradycardia 02/25/2015   Hypersomnia 02/25/2015  Clinical depression 02/25/2015   Elevated blood sugar 02/25/2015   Fibrositis 02/25/2015   Acid reflux 02/25/2015   Hepatitis non A non B 02/25/2015   H/O disease 02/25/2015   Hypercholesteremia 02/25/2015   Headache, migraine 02/25/2015   Muscle ache 02/25/2015   Allergic rhinitis, seasonal 02/25/2015   Avitaminosis D 02/25/2015    REFERRING DIAG: Migraine  THERAPY DIAG:  Abnormal posture  Nonintractable headache, unspecified chronicity pattern, unspecified headache type  Cervicalgia  Chronic right shoulder pain  Rationale for Evaluation and  Treatment Rehabilitation  PERTINENT HISTORY: Patient is a 66 year old female who presents to physical therapy for migraines with specific referral for dry needling.  Patient has been having migraines for about 14-15 years. They have changed from front temporal all day long 2-3/10, a couple years ago started having tingling in left side of body, numbness in arm and leg. Additionally sometimes get's belly aches.  Changed medication in 2020. Was getting trigger injections but it was too expensive. PMH of HLD, aortic atherosclerosis, asthma, COVID 19, SOB, hypothyroidism, atypical facial pain (2019), neuropathy (2020), fibromyalgia   PRECAUTIONS: n/a  SUBJECTIVE: Patient reports having some rib pain today. Radiating pain present from shoulder to head.   PAIN:  Are you having pain? Yes: NPRS scale: 2/10 Pain location: head Pain description: migraine Aggravating factors: stress, tension Relieving factors: heat, rest     TODAY'S TREATMENT:       Treatment Cervical side bend with overpressure to glenohumeral joint 5x 30 seconds Cervical rotation with overpressure to glenohumeral joint: 5x 30 seconds Suboccipital release 4x30 seconds J mobilization for postural kyphosis reduction 4x 30 seconds cervical and thoracic mobilizations grade II for reduced hypomobility x 6 minutes  Y overhead RTB 15x Chin tuck 10x  Scapular retraction with cervical rotation hold 3 seconds 10x each side      Trigger Point Dry Needling (TDN), unbilled Education performed with patient regarding potential benefit of TDN. Reviewed precautions and risks with patient. Reviewed special precautions/risks over lung fields which include pneumothorax. Reviewed signs and symptoms of pneumothorax and advised pt to go to ER immediately if these symptoms develop advise them of dry needling treatment. Extensive time spent with pt to ensure full understanding of TDN risks. Pt provided verbal consent to treatment. TDN performed to   with 0.30 x 30 and .2 x 0.3 single needle placements with local twitch response (LTR). Pistoning technique utilized. Improved pain-free motion following intervention. L and Rcervical paraspinals, Upper trap (L and R), temporalis, suboccipital musculature  x  10 minutes       PATIENT EDUCATION: Education details: exercise technique, TDN Person educated: Patient Education method: Explanation, Demonstration, Tactile cues, and Verbal cues Education comprehension: verbalized understanding, returned demonstration, verbal cues required, and tactile cues required   HOME EXERCISE PROGRAM: See prior sessions    PT Short Term Goals      PT SHORT TERM GOAL #1   Title Pt will be independent with HEP in order to decrease pain in order to improve pain-free function at home and work.    Baseline 6/7: HEP given 9/1: HEp compliant 11/15: HEP compliant    Time 6    Period Weeks    Status Achieved    Target Date 08/07/21              PT Long Term Goals -      PT LONG TERM GOAL #1   Title Patient will increase FOTO score to equal to or greater than 59%  to demonstrate statistically significant improvement in mobility and quality of life.    Baseline 6/7: 50% 9/1: 59% 11/15: 86% 2/14: 71%    Time 12    Period Weeks    Status Achieved      PT LONG TERM GOAL #2   Title Pt will decrease worst pain as reported on NPRS by at least 3 points (3/10) in order to demonstrate clinically significant reduction in pain.    Baseline 6/7: 6/10 9/1: 4/10 11/15: 5/10 2/14: 3-4/10 4/25: 6/10 10/31: 5/10   Time 12    Period Weeks    Status Partially Met    Target Date 11/17/2022       PT LONG TERM GOAL #3   Title Pt will demonstrate pain-free full range cervical motion in order to perform IADLs such as driving and household chores without increase in symptoms.    Baseline 6/7: see note for limitation 9/1:flexion 54, extension 60, SB R 35 L 30, rotation R 51 L 62 11/15: flexion 58, extension 60, SB R and  L 35, R rotation 59 L rotation 60 2/14: flexion 70, extension 60 SB R and L 38, R rotation 59 L 64 4/25: see note 10/31: unable to find tool for ROM assessment, will perform next session.    Time 12    Period Weeks    Status Partially Met    Target Date 11/17/2022       PT LONG TERM GOAL #4   Title Patient will have less than two days a week of high pain migraines and demonstrate ability to self control/contain migraines.    Baseline 9/1:every day getting the mild ones, 2x/week for high pain migraines. 11/15: two days of high pain in past month, has mini headaches every day 2/14: has worsening headaches in the evening but only 2-3 days of high pain 4/25: 2 days of high pain 10/31: 1-2 days a week    Time 12    Period Weeks    Status Partially Met    Target Date 11/17/2022       PT LONG TERM GOAL #5   Title Patient will reduce Neck Disability Index score to <10% to demonstrate minimal disability with ADL's including improved sleeping tolerance, sitting tolerance, etc for better mobility at home and work.    Baseline 2/14: 18% 4/25: 16% 10/31: 16%   Time 12    Period Weeks    Status Partially Met    Target Date 11/17/2022     PT LONG TERM GOAL #6  Title Patient will tolerate three hours in clinic with fluorescent lights with pain increase of <3/10 for improved quality of life with work   Baseline 10/31: pain worsens with fluorescent lights.   Time 12   Period Weeks   Status New  Target Date 11/17/2022              Plan     Clinical Impression Statement Patient's rib assessed, inflammation of abdominal and intercostal musculature noted, educated on muscle relaxation techniques. Patient reports reduced pain by end of session, large trigger points reduced by end of session with TDN.  She will benefit from skilled physical therapy to reduce pain, improve patient independence with symptom management, and improve quality of life    Personal Factors and Comorbidities Age;Behavior  Pattern;Comorbidity 3+;Fitness;Past/Current Experience;Profession;Time since onset of injury/illness/exacerbation    Comorbidities HLD, aortic atherosclerosis, asthma, COVID 19, SOB, hypothyroidism, atypical facial pain (2019), neuropathy (2020), fibromyalgia    Examination-Activity Limitations Caring  for Others;Carry;Lift;Sleep;Transfers    Examination-Participation Restrictions Cleaning;Community Activity;Interpersonal Relationship;Driving;Laundry;Shop;Occupation;Meal Prep;Yard Work    [employer] officer Evolving/Moderate complexity    Rehab Potential Fair    PT Frequency Biweekly    PT Duration 12 weeks    PT Treatment/Interventions ADLs/Self Care Home Management;Dry needling;Manual techniques;Therapeutic exercise;Therapeutic activities;Neuromuscular re-education;Aquatic Therapy;Biofeedback;Canalith Repostioning;Cryotherapy;Electrical Stimulation;Iontophoresis 33m/ml Dexamethasone;Moist Heat;Ultrasound;Patient/family education;Passive range of motion;Energy conservation;Vestibular;Joint Manipulations;Traction;Gait training;Balance training;Taping;Spinal Manipulations;Visual/perceptual remediation/compensation    PT Next Visit Plan exercise technique, body mechanics    Consulted and Agree with Plan of Care Patient              MJanna Arch PT 10/08/2022, 2:49 PM

## 2022-10-08 ENCOUNTER — Ambulatory Visit: Payer: 59 | Attending: Internal Medicine

## 2022-10-08 DIAGNOSIS — G8929 Other chronic pain: Secondary | ICD-10-CM | POA: Diagnosis not present

## 2022-10-08 DIAGNOSIS — R293 Abnormal posture: Secondary | ICD-10-CM | POA: Diagnosis not present

## 2022-10-08 DIAGNOSIS — R519 Headache, unspecified: Secondary | ICD-10-CM | POA: Insufficient documentation

## 2022-10-08 DIAGNOSIS — M542 Cervicalgia: Secondary | ICD-10-CM | POA: Insufficient documentation

## 2022-10-08 DIAGNOSIS — M25511 Pain in right shoulder: Secondary | ICD-10-CM | POA: Insufficient documentation

## 2022-10-10 ENCOUNTER — Other Ambulatory Visit (HOSPITAL_COMMUNITY): Payer: Self-pay

## 2022-10-15 ENCOUNTER — Other Ambulatory Visit (HOSPITAL_COMMUNITY): Payer: Self-pay

## 2022-10-17 NOTE — Therapy (Signed)
OUTPATIENT PHYSICAL THERAPY TREATMENT NOTE   Patient Name: Rachel Fry MRN: [id number] DOB:1956-09-03, 66 y.o., female Today's Date: 10/20/2022  PCP: [name] MD  REFERRING PROVIDER: [name] MD    PT End of Session - 10/20/22 1503     Visit Number 34    Number of Visits 36    Date for PT Re-Evaluation 11/17/22    PT Start Time 1430    PT Stop Time 1513    PT Time Calculation (min) 43 min    Activity Tolerance Patient tolerated treatment well    Behavior During Therapy WFL for tasks assessed/performed                     Past Medical History:  Diagnosis Date   Allergy    Asthma    COVID-19    History of kidney stones    Hypothyroidism    Migraines    Numbness and tingling    left side of body, occasionally right side    S/P Botox injection    Urticaria    Past Surgical History:  Procedure Laterality Date   ADENOIDECTOMY     ARM NEUROPLASTY Right 1994 and 1996   for chronic pain   CESAREAN SECTION     CYSTOSCOPY W/ RETROGRADES Left 04/23/2020   Procedure: CYSTOSCOPY WITH RETROGRADE PYELOGRAM;  Surgeon: Abbie Sons, MD;  Location: [facility];  Service: Urology;  Laterality: Left;   CYSTOSCOPY W/ RETROGRADES Left 05/13/2020   Procedure: CYSTOSCOPY WITH RETROGRADE PYELOGRAM;  Surgeon: Hollice Espy, MD;  Location: [facility];  Service: Urology;  Laterality: Left;   CYSTOSCOPY WITH STENT PLACEMENT Left 04/23/2020   Procedure: CYSTOSCOPY WITH STENT PLACEMENT;  Surgeon: Abbie Sons, MD;  Location: [facility];  Service: Urology;  Laterality: Left;   CYSTOSCOPY/URETEROSCOPY/HOLMIUM LASER/STENT PLACEMENT Left 05/13/2020   Procedure: CYSTOSCOPY/URETEROSCOPY/LASER/STENT EXCHANGE;  Surgeon: Hollice Espy, MD;  Location: [facility];  Service: Urology;  Laterality: Left;   OVARIAN CYST REMOVAL  1973   STONE EXTRACTION WITH BASKET  05/13/2020   Procedure: STONE EXTRACTION WITH BASKET;  Surgeon: Hollice Espy, MD;  Location: [facility];  Service:  Urology;;   [place]   Patient Active Problem List   Diagnosis Date Noted   Dry eyes 05/12/2022   Asthma 05/12/2022   Fibromyalgia 05/12/2022   Hyperlipidemia 05/12/2022   Osteoporosis 03/13/2021   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 hauler 12/25/2020   Chronic urticaria 12/25/2020   Moderate persistent asthma, uncomplicated 56/97/9480   Ageusia 08/12/2020   Anosmia 08/12/2020   Myalgia, epidemic 08/12/2020   Cough with exposure to COVID-19 virus 08/12/2020   Olfactory impairment 05/29/2020   Acute UTI 04/23/2020   Left ureteral stone 04/23/2020   Intermittent chest pain 02/09/2020   History of COVID-19 02/09/2020   Shortness of breath 02/09/2020   Loss of taste 02/09/2020   Loss of smell 02/09/2020   Rash 02/09/2020   Arthralgia 02/09/2020   Benign essential hypertension 10/25/2019   Gastroesophageal reflux disease without esophagitis 06/23/2019   Chronic migraine 06/23/2019   Neuropathy 11/17/2018   Atypical facial pain 06/30/2018   Hypothyroidism 05/19/2018   Postoperative history of checked last year 04/22/2017   Weakness of left arm 04/22/2017   Numbness and tingling in left arm 04/22/2017   Lower back pain 04/02/2016   Acute stress disorder 02/25/2015   Anxiety 02/25/2015   Airway hyperreactivity 02/25/2015   Bradycardia 02/25/2015   Hypersomnia  02/25/2015   Clinical depression 02/25/2015   Elevated blood sugar 02/25/2015   Fibrositis 02/25/2015   Acid reflux 02/25/2015   Hepatitis non A non B 02/25/2015   H/O disease 02/25/2015   Hypercholesteremia 02/25/2015   Headache, migraine 02/25/2015   Muscle ache 02/25/2015   Allergic rhinitis, seasonal 02/25/2015   Avitaminosis D 02/25/2015    REFERRING DIAG: Migraine  THERAPY DIAG:  Abnormal posture  Nonintractable headache, unspecified chronicity pattern, unspecified headache type  Cervicalgia  Chronic right shoulder pain  Rationale for Evaluation and  Treatment Rehabilitation  PERTINENT HISTORY: Patient is a 66 year old female who presents to physical therapy for migraines with specific referral for dry needling.  Patient has been having migraines for about 14-15 years. They have changed from front temporal all day long 2-3/10, a couple years ago started having tingling in left side of body, numbness in arm and leg. Additionally sometimes get's belly aches.  Changed medication in 2020. Was getting trigger injections but it was too expensive. PMH of HLD, aortic atherosclerosis, asthma, COVID 19, SOB, hypothyroidism, atypical facial pain (2019), neuropathy (2020), fibromyalgia   PRECAUTIONS: n/a  SUBJECTIVE: Patient reports her left side was aggravated prior to PT session, getting infusion Friday for migraine medication.    PAIN:  Are you having pain? Yes: NPRS scale: 2/10 Pain location: head Pain description: migraine Aggravating factors: stress, tension Relieving factors: heat, rest     TODAY'S TREATMENT:       Treatment Cervical side bend with overpressure to glenohumeral joint 5x 30 seconds Cervical rotation with overpressure to glenohumeral joint: 5x 30 seconds Suboccipital release 4x30 seconds J mobilization for postural kyphosis reduction 4x 30 seconds cervical and thoracic mobilizations grade II for reduced hypomobility x 6 minutes  Y overhead RTB 15x Chin tuck 10x  Scapular retraction with cervical rotation hold 3 seconds 10x each side   seated thoracic extension over chair   Trigger Point Dry Needling (TDN), unbilled Education performed with patient regarding potential benefit of TDN. Reviewed precautions and risks with patient. Reviewed special precautions/risks over lung fields which include pneumothorax. Reviewed signs and symptoms of pneumothorax and advised pt to go to ER immediately if these symptoms develop advise them of dry needling treatment. Extensive time spent with pt to ensure full understanding of TDN  risks. Pt provided verbal consent to treatment. TDN performed to  with 0.30 x 30 and .2 x 0.3 single needle placements with local twitch response (LTR). Pistoning technique utilized. Improved pain-free motion following intervention. L and Rcervical paraspinals, Upper trap (L and R), temporalis, suboccipital musculature  x  10 minutes       PATIENT EDUCATION: Education details: exercise technique, TDN Person educated: Patient Education method: Explanation, Demonstration, Tactile cues, and Verbal cues Education comprehension: verbalized understanding, returned demonstration, verbal cues required, and tactile cues required   HOME EXERCISE PROGRAM: See prior sessions    PT Short Term Goals      PT SHORT TERM GOAL #1   Title Pt will be independent with HEP in order to decrease pain in order to improve pain-free function at home and work.    Baseline 6/7: HEP given 9/1: HEp compliant 11/15: HEP compliant    Time 6    Period Weeks    Status Achieved    Target Date 08/07/21              PT Long Term Goals -      PT LONG TERM GOAL #1   Title Patient  will increase FOTO score to equal to or greater than 59%    to demonstrate statistically significant improvement in mobility and quality of life.    Baseline 6/7: 50% 9/1: 59% 11/15: 86% 2/14: 71%    Time 12    Period Weeks    Status Achieved      PT LONG TERM GOAL #2   Title Pt will decrease worst pain as reported on NPRS by at least 3 points (3/10) in order to demonstrate clinically significant reduction in pain.    Baseline 6/7: 6/10 9/1: 4/10 11/15: 5/10 2/14: 3-4/10 4/25: 6/10 10/31: 5/10   Time 12    Period Weeks    Status Partially Met    Target Date 11/17/2022       PT LONG TERM GOAL #3   Title Pt will demonstrate pain-free full range cervical motion in order to perform IADLs such as driving and household chores without increase in symptoms.    Baseline 6/7: see note for limitation 9/1:flexion 54, extension 60, SB R 35  L 30, rotation R 51 L 62 11/15: flexion 58, extension 60, SB R and L 35, R rotation 59 L rotation 60 2/14: flexion 70, extension 60 SB R and L 38, R rotation 59 L 64 4/25: see note 10/31: unable to find tool for ROM assessment, will perform next session.    Time 12    Period Weeks    Status Partially Met    Target Date 11/17/2022       PT LONG TERM GOAL #4   Title Patient will have less than two days a week of high pain migraines and demonstrate ability to self control/contain migraines.    Baseline 9/1:every day getting the mild ones, 2x/week for high pain migraines. 11/15: two days of high pain in past month, has mini headaches every day 2/14: has worsening headaches in the evening but only 2-3 days of high pain 4/25: 2 days of high pain 10/31: 1-2 days a week    Time 12    Period Weeks    Status Partially Met    Target Date 11/17/2022       PT LONG TERM GOAL #5   Title Patient will reduce Neck Disability Index score to <10% to demonstrate minimal disability with ADL's including improved sleeping tolerance, sitting tolerance, etc for better mobility at home and work.    Baseline 2/14: 18% 4/25: 16% 10/31: 16%   Time 12    Period Weeks    Status Partially Met    Target Date 11/17/2022     PT LONG TERM GOAL #6  Title Patient will tolerate three hours in clinic with fluorescent lights with pain increase of <3/10 for improved quality of life with work   Baseline 10/31: pain worsens with fluorescent lights.   Time 12   Period Weeks   Status New  Target Date 11/17/2022              Plan     Clinical Impression Statement Patient is highly motivated throughout session. L sided pain reduced by end of session. Postural strengthening interventions tolerated well with no pain increase.  She will benefit from skilled physical therapy to reduce pain, improve patient independence with symptom management, and improve quality of life    Personal Factors and Comorbidities Age;Behavior  Pattern;Comorbidity 3+;Fitness;Past/Current Experience;Profession;Time since onset of injury/illness/exacerbation    Comorbidities HLD, aortic atherosclerosis, asthma, COVID 19, SOB, hypothyroidism, atypical facial pain (2019), neuropathy (2020), fibromyalgia  Examination-Activity Limitations Caring for Others;Carry;Lift;Sleep;Transfers    Examination-Participation Restrictions Cleaning;Community Activity;Interpersonal Relationship;Driving;Laundry;Shop;Occupation;Meal Prep;Yard Work    [employer] officer Evolving/Moderate complexity    Rehab Potential Fair    PT Frequency Biweekly    PT Duration 12 weeks    PT Treatment/Interventions ADLs/Self Care Home Management;Dry needling;Manual techniques;Therapeutic exercise;Therapeutic activities;Neuromuscular re-education;Aquatic Therapy;Biofeedback;Canalith Repostioning;Cryotherapy;Electrical Stimulation;Iontophoresis 73m/ml Dexamethasone;Moist Heat;Ultrasound;Patient/family education;Passive range of motion;Energy conservation;Vestibular;Joint Manipulations;Traction;Gait training;Balance training;Taping;Spinal Manipulations;Visual/perceptual remediation/compensation    PT Next Visit Plan exercise technique, body mechanics    Consulted and Agree with Plan of Care Patient              MJanna Arch PT 10/20/2022, 4:12 PM

## 2022-10-20 ENCOUNTER — Other Ambulatory Visit (HOSPITAL_COMMUNITY): Payer: Self-pay

## 2022-10-20 ENCOUNTER — Ambulatory Visit: Payer: 59

## 2022-10-20 DIAGNOSIS — M542 Cervicalgia: Secondary | ICD-10-CM

## 2022-10-20 DIAGNOSIS — M25511 Pain in right shoulder: Secondary | ICD-10-CM | POA: Diagnosis not present

## 2022-10-20 DIAGNOSIS — R519 Headache, unspecified: Secondary | ICD-10-CM

## 2022-10-20 DIAGNOSIS — R293 Abnormal posture: Secondary | ICD-10-CM | POA: Diagnosis not present

## 2022-10-20 DIAGNOSIS — G8929 Other chronic pain: Secondary | ICD-10-CM | POA: Diagnosis not present

## 2022-10-21 ENCOUNTER — Other Ambulatory Visit: Payer: Self-pay | Admitting: Allergy & Immunology

## 2022-10-21 ENCOUNTER — Other Ambulatory Visit: Payer: Self-pay

## 2022-10-21 ENCOUNTER — Other Ambulatory Visit (HOSPITAL_COMMUNITY): Payer: Self-pay

## 2022-10-21 ENCOUNTER — Ambulatory Visit: Payer: 59

## 2022-10-23 DIAGNOSIS — G43709 Chronic migraine without aura, not intractable, without status migrainosus: Secondary | ICD-10-CM | POA: Diagnosis not present

## 2022-10-27 ENCOUNTER — Other Ambulatory Visit (HOSPITAL_COMMUNITY): Payer: Self-pay

## 2022-10-28 ENCOUNTER — Other Ambulatory Visit (HOSPITAL_COMMUNITY): Payer: Self-pay

## 2022-10-28 ENCOUNTER — Other Ambulatory Visit: Payer: Self-pay

## 2022-11-03 ENCOUNTER — Other Ambulatory Visit (HOSPITAL_COMMUNITY): Payer: Self-pay

## 2022-11-04 ENCOUNTER — Ambulatory Visit: Payer: 59

## 2022-11-04 ENCOUNTER — Other Ambulatory Visit (HOSPITAL_COMMUNITY): Payer: Self-pay

## 2022-11-04 NOTE — Therapy (Signed)
OUTPATIENT PHYSICAL THERAPY TREATMENT NOTE   Patient Name: Rachel Fry MRN: [id number] DOB:04/12/1956, 67 y.o., female Today's Date: 11/05/2022  PCP: [name] MD  REFERRING PROVIDER: [name] MD    PT End of Session - 11/05/22 0713     Visit Number 35    Number of Visits 36    Date for PT Re-Evaluation 11/17/22    PT Start Time 0710    PT Stop Time 0755    PT Time Calculation (min) 45 min    Activity Tolerance Patient tolerated treatment well    Behavior During Therapy WFL for tasks assessed/performed                      Past Medical History:  Diagnosis Date   Allergy    Asthma    COVID-19    History of kidney stones    Hypothyroidism    Migraines    Numbness and tingling    left side of body, occasionally right side    S/P Botox injection    Urticaria    Past Surgical History:  Procedure Laterality Date   ADENOIDECTOMY     ARM NEUROPLASTY Right 1994 and 1996   for chronic pain   CESAREAN SECTION     CYSTOSCOPY W/ RETROGRADES Left 04/23/2020   Procedure: CYSTOSCOPY WITH RETROGRADE PYELOGRAM;  Surgeon: Abbie Sons, MD;  Location: [facility];  Service: Urology;  Laterality: Left;   CYSTOSCOPY W/ RETROGRADES Left 05/13/2020   Procedure: CYSTOSCOPY WITH RETROGRADE PYELOGRAM;  Surgeon: Hollice Espy, MD;  Location: [facility];  Service: Urology;  Laterality: Left;   CYSTOSCOPY WITH STENT PLACEMENT Left 04/23/2020   Procedure: CYSTOSCOPY WITH STENT PLACEMENT;  Surgeon: Abbie Sons, MD;  Location: [facility];  Service: Urology;  Laterality: Left;   CYSTOSCOPY/URETEROSCOPY/HOLMIUM LASER/STENT PLACEMENT Left 05/13/2020   Procedure: CYSTOSCOPY/URETEROSCOPY/LASER/STENT EXCHANGE;  Surgeon: Hollice Espy, MD;  Location: [facility];  Service: Urology;  Laterality: Left;   OVARIAN CYST REMOVAL  1973   STONE EXTRACTION WITH BASKET  05/13/2020   Procedure: STONE EXTRACTION WITH BASKET;  Surgeon: Hollice Espy, MD;  Location: [facility];  Service:  Urology;;   [place]   Patient Active Problem List   Diagnosis Date Noted   Dry eyes 05/12/2022   Asthma 05/12/2022   Fibromyalgia 05/12/2022   Hyperlipidemia 05/12/2022   Osteoporosis 03/13/2021   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 hauler 12/25/2020   Chronic urticaria 12/25/2020   Moderate persistent asthma, uncomplicated 54/65/0354   Ageusia 08/12/2020   Anosmia 08/12/2020   Myalgia, epidemic 08/12/2020   Cough with exposure to COVID-19 virus 08/12/2020   Olfactory impairment 05/29/2020   Acute UTI 04/23/2020   Left ureteral stone 04/23/2020   Intermittent chest pain 02/09/2020   History of COVID-19 02/09/2020   Shortness of breath 02/09/2020   Loss of taste 02/09/2020   Loss of smell 02/09/2020   Rash 02/09/2020   Arthralgia 02/09/2020   Benign essential hypertension 10/25/2019   Gastroesophageal reflux disease without esophagitis 06/23/2019   Chronic migraine 06/23/2019   Neuropathy 11/17/2018   Atypical facial pain 06/30/2018   Hypothyroidism 05/19/2018   Postoperative history of checked last year 04/22/2017   Weakness of left arm 04/22/2017   Numbness and tingling in left arm 04/22/2017   Lower back pain 04/02/2016   Acute stress disorder 02/25/2015   Anxiety 02/25/2015   Airway hyperreactivity 02/25/2015   Bradycardia 02/25/2015  Hypersomnia 02/25/2015   Clinical depression 02/25/2015   Elevated blood sugar 02/25/2015   Fibrositis 02/25/2015   Acid reflux 02/25/2015   Hepatitis non A non B 02/25/2015   H/O disease 02/25/2015   Hypercholesteremia 02/25/2015   Headache, migraine 02/25/2015   Muscle ache 02/25/2015   Allergic rhinitis, seasonal 02/25/2015   Avitaminosis D 02/25/2015    REFERRING DIAG: Migraine  THERAPY DIAG:  Abnormal posture  Nonintractable headache, unspecified chronicity pattern, unspecified headache type  Cervicalgia  Chronic right shoulder pain  Rationale for Evaluation and  Treatment Rehabilitation  PERTINENT HISTORY: Patient is a 67 year old female who presents to physical therapy for migraines with specific referral for dry needling.  Patient has been having migraines for about 14-15 years. They have changed from front temporal all day long 2-3/10, a couple years ago started having tingling in left side of body, numbness in arm and leg. Additionally sometimes get's belly aches.  Changed medication in 2020. Was getting trigger injections but it was too expensive. PMH of HLD, aortic atherosclerosis, asthma, COVID 19, SOB, hypothyroidism, atypical facial pain (2019), neuropathy (2020), fibromyalgia   PRECAUTIONS: n/a  SUBJECTIVE: Patient reports L sided eye pain and migraine since Friday and nausea.   PAIN:  Are you having pain? Yes: NPRS scale: 2/10 Pain location: head Pain description: migraine Aggravating factors: stress, tension Relieving factors: heat, rest     TODAY'S TREATMENT:       Treatment Cervical side bend with overpressure to glenohumeral joint 5x 30 seconds Cervical rotation with overpressure to glenohumeral joint: 5x 30 seconds Suboccipital release 4x30 seconds J mobilization for postural kyphosis reduction 4x 30 seconds cervical and thoracic mobilizations grade II for reduced hypomobility x 6 minutes  Y overhead RTB 15x RTB ER 10x     Trigger Point Dry Needling (TDN), unbilled Education performed with patient regarding potential benefit of TDN. Reviewed precautions and risks with patient. Reviewed special precautions/risks over lung fields which include pneumothorax. Reviewed signs and symptoms of pneumothorax and advised pt to go to ER immediately if these symptoms develop advise them of dry needling treatment. Extensive time spent with pt to ensure full understanding of TDN risks. Pt provided verbal consent to treatment. TDN performed to  with 0.30 x 30 and .2 x 0.3 single needle placements with local twitch response (LTR). Pistoning  technique utilized. Improved pain-free motion following intervention. L and Rcervical paraspinals, Upper trap (L and R), temporalis, suboccipital musculature  x  10 minutes       PATIENT EDUCATION: Education details: exercise technique, TDN Person educated: Patient Education method: Explanation, Demonstration, Tactile cues, and Verbal cues Education comprehension: verbalized understanding, returned demonstration, verbal cues required, and tactile cues required   HOME EXERCISE PROGRAM: See prior sessions    PT Short Term Goals      PT SHORT TERM GOAL #1   Title Pt will be independent with HEP in order to decrease pain in order to improve pain-free function at home and work.    Baseline 6/7: HEP given 9/1: HEp compliant 11/15: HEP compliant    Time 6    Period Weeks    Status Achieved    Target Date 08/07/21              PT Long Term Goals -      PT LONG TERM GOAL #1   Title Patient will increase FOTO score to equal to or greater than 59%    to demonstrate statistically significant improvement in mobility and  quality of life.    Baseline 6/7: 50% 9/1: 59% 11/15: 86% 2/14: 71%    Time 12    Period Weeks    Status Achieved      PT LONG TERM GOAL #2   Title Pt will decrease worst pain as reported on NPRS by at least 3 points (3/10) in order to demonstrate clinically significant reduction in pain.    Baseline 6/7: 6/10 9/1: 4/10 11/15: 5/10 2/14: 3-4/10 4/25: 6/10 10/31: 5/10   Time 12    Period Weeks    Status Partially Met    Target Date 11/17/2022       PT LONG TERM GOAL #3   Title Pt will demonstrate pain-free full range cervical motion in order to perform IADLs such as driving and household chores without increase in symptoms.    Baseline 6/7: see note for limitation 9/1:flexion 54, extension 60, SB R 35 L 30, rotation R 51 L 62 11/15: flexion 58, extension 60, SB R and L 35, R rotation 59 L rotation 60 2/14: flexion 70, extension 60 SB R and L 38, R rotation 59 L  64 4/25: see note 10/31: unable to find tool for ROM assessment, will perform next session.    Time 12    Period Weeks    Status Partially Met    Target Date 11/17/2022       PT LONG TERM GOAL #4   Title Patient will have less than two days a week of high pain migraines and demonstrate ability to self control/contain migraines.    Baseline 9/1:every day getting the mild ones, 2x/week for high pain migraines. 11/15: two days of high pain in past month, has mini headaches every day 2/14: has worsening headaches in the evening but only 2-3 days of high pain 4/25: 2 days of high pain 10/31: 1-2 days a week    Time 12    Period Weeks    Status Partially Met    Target Date 11/17/2022       PT LONG TERM GOAL #5   Title Patient will reduce Neck Disability Index score to <10% to demonstrate minimal disability with ADL's including improved sleeping tolerance, sitting tolerance, etc for better mobility at home and work.    Baseline 2/14: 18% 4/25: 16% 10/31: 16%   Time 12    Period Weeks    Status Partially Met    Target Date 11/17/2022     PT LONG TERM GOAL #6  Title Patient will tolerate three hours in clinic with fluorescent lights with pain increase of <3/10 for improved quality of life with work   Baseline 10/31: pain worsens with fluorescent lights.   Time 12   Period Weeks   Status New  Target Date 11/17/2022              Plan     Clinical Impression Statement Patient initially presents with L eye pain and migraine that is improved by end of session. Multiple large trigger points released with TDN. Patient educated that next session will be recert. Patient agreeable.  She will benefit from skilled physical therapy to reduce pain, improve patient independence with symptom management, and improve quality of life    Personal Factors and Comorbidities Age;Behavior Pattern;Comorbidity 3+;Fitness;Past/Current Experience;Profession;Time since onset of injury/illness/exacerbation     Comorbidities HLD, aortic atherosclerosis, asthma, COVID 19, SOB, hypothyroidism, atypical facial pain (2019), neuropathy (2020), fibromyalgia    Examination-Activity Limitations Caring for Others;Carry;Lift;Sleep;Transfers    Examination-Participation Restrictions Cleaning;Community  Activity;Interpersonal Relationship;Driving;Laundry;Shop;Occupation;Meal Prep;Yard Work    [employer] Evolving/Moderate complexity    Rehab Potential Fair    PT Frequency Biweekly    PT Duration 12 weeks    PT Treatment/Interventions ADLs/Self Care Home Management;Dry needling;Manual techniques;Therapeutic exercise;Therapeutic activities;Neuromuscular re-education;Aquatic Therapy;Biofeedback;Canalith Repostioning;Cryotherapy;Electrical Stimulation;Iontophoresis '4mg'$ /ml Dexamethasone;Moist Heat;Ultrasound;Patient/family education;Passive range of motion;Energy conservation;Vestibular;Joint Manipulations;Traction;Gait training;Balance training;Taping;Spinal Manipulations;Visual/perceptual remediation/compensation    PT Next Visit Plan exercise technique, body mechanics    Consulted and Agree with Plan of Care Patient              Janna Arch, PT 11/05/2022, 8:12 AM

## 2022-11-05 ENCOUNTER — Ambulatory Visit: Payer: 59 | Attending: Internal Medicine

## 2022-11-05 ENCOUNTER — Other Ambulatory Visit: Payer: Self-pay

## 2022-11-05 ENCOUNTER — Other Ambulatory Visit: Payer: Self-pay | Admitting: Allergy & Immunology

## 2022-11-05 ENCOUNTER — Other Ambulatory Visit (HOSPITAL_COMMUNITY): Payer: Self-pay

## 2022-11-05 DIAGNOSIS — R519 Headache, unspecified: Secondary | ICD-10-CM | POA: Diagnosis not present

## 2022-11-05 DIAGNOSIS — M542 Cervicalgia: Secondary | ICD-10-CM | POA: Diagnosis not present

## 2022-11-05 DIAGNOSIS — R293 Abnormal posture: Secondary | ICD-10-CM | POA: Diagnosis not present

## 2022-11-05 DIAGNOSIS — M25511 Pain in right shoulder: Secondary | ICD-10-CM | POA: Insufficient documentation

## 2022-11-05 DIAGNOSIS — G8929 Other chronic pain: Secondary | ICD-10-CM | POA: Diagnosis not present

## 2022-11-06 ENCOUNTER — Other Ambulatory Visit: Payer: Self-pay

## 2022-11-06 ENCOUNTER — Other Ambulatory Visit (HOSPITAL_COMMUNITY): Payer: Self-pay

## 2022-11-10 ENCOUNTER — Other Ambulatory Visit: Payer: Self-pay

## 2022-11-12 ENCOUNTER — Other Ambulatory Visit (HOSPITAL_COMMUNITY): Payer: Self-pay

## 2022-11-13 ENCOUNTER — Other Ambulatory Visit: Payer: Self-pay

## 2022-11-13 MED ORDER — COVID-19 MRNA VAC-TRIS(PFIZER) 30 MCG/0.3ML IM SUSY
PREFILLED_SYRINGE | INTRAMUSCULAR | 0 refills | Status: DC
  Filled 2022-11-13: qty 0.3, 1d supply, fill #0

## 2022-11-16 ENCOUNTER — Other Ambulatory Visit (HOSPITAL_COMMUNITY): Payer: Self-pay

## 2022-11-17 ENCOUNTER — Ambulatory Visit (INDEPENDENT_AMBULATORY_CARE_PROVIDER_SITE_OTHER): Payer: 59 | Admitting: Allergy & Immunology

## 2022-11-17 ENCOUNTER — Other Ambulatory Visit (HOSPITAL_COMMUNITY): Payer: Self-pay

## 2022-11-17 ENCOUNTER — Encounter: Payer: Self-pay | Admitting: Allergy & Immunology

## 2022-11-17 ENCOUNTER — Other Ambulatory Visit: Payer: Self-pay

## 2022-11-17 ENCOUNTER — Encounter: Payer: Self-pay | Admitting: Pharmacist

## 2022-11-17 VITALS — BP 122/86 | HR 77 | Temp 97.4°F | Resp 18

## 2022-11-17 DIAGNOSIS — J4540 Moderate persistent asthma, uncomplicated: Secondary | ICD-10-CM

## 2022-11-17 DIAGNOSIS — L508 Other urticaria: Secondary | ICD-10-CM

## 2022-11-17 DIAGNOSIS — U099 Post covid-19 condition, unspecified: Secondary | ICD-10-CM | POA: Diagnosis not present

## 2022-11-17 MED ORDER — ZAFIRLUKAST 10 MG PO TABS
10.0000 mg | ORAL_TABLET | Freq: Two times a day (BID) | ORAL | 1 refills | Status: DC
  Filled 2022-11-17: qty 180, fill #0
  Filled 2023-02-16: qty 180, 90d supply, fill #0
  Filled 2023-05-13: qty 180, 90d supply, fill #1

## 2022-11-17 MED ORDER — ALBUTEROL SULFATE HFA 108 (90 BASE) MCG/ACT IN AERS
2.0000 | INHALATION_SPRAY | RESPIRATORY_TRACT | 1 refills | Status: DC | PRN
  Filled 2022-11-17: qty 20.1, 51d supply, fill #0
  Filled 2023-01-05: qty 20.1, 48d supply, fill #1

## 2022-11-17 MED ORDER — EPINEPHRINE 0.3 MG/0.3ML IJ SOAJ
0.3000 mg | INTRAMUSCULAR | 1 refills | Status: DC | PRN
  Filled 2022-11-17: qty 2, 7d supply, fill #0
  Filled 2023-06-15: qty 2, 7d supply, fill #1

## 2022-11-17 MED ORDER — CETIRIZINE HCL 10 MG PO TABS
10.0000 mg | ORAL_TABLET | Freq: Every evening | ORAL | 1 refills | Status: DC
  Filled 2022-11-17: qty 90, 90d supply, fill #0

## 2022-11-17 MED ORDER — FLUTICASONE PROPIONATE HFA 110 MCG/ACT IN AERO
2.0000 | INHALATION_SPRAY | Freq: Two times a day (BID) | RESPIRATORY_TRACT | 5 refills | Status: DC
  Filled 2022-11-17: qty 12, 30d supply, fill #0
  Filled 2022-12-12: qty 12, 30d supply, fill #1
  Filled 2023-01-11: qty 12, 30d supply, fill #2
  Filled 2023-02-16: qty 12, 30d supply, fill #3
  Filled 2023-03-13: qty 24, 60d supply, fill #4

## 2022-11-17 MED ORDER — PREDNISONE 10 MG PO TABS
ORAL_TABLET | ORAL | 0 refills | Status: AC
  Filled 2022-11-17: qty 18, 9d supply, fill #0

## 2022-11-17 NOTE — Patient Instructions (Addendum)
1. Post COVID syndrome with urticarial rash - Continue with Xolair every two weeks.  - Continue with cetirizine '10mg'$  at night (you can add on a morning dose if you want to see if that helps).   2. Moderate persistent asthma, uncomplicated - Lung testing not done. - I think we are on a good track with your breathing.  - Daily controller medication(s): Flovent 169mg 2 puffs twice daily with spacer and Accolate twice daily - Prior to physical activity: albuterol 2 puffs 10-15 minutes before physical activity. - Rescue medications: albuterol 4 puffs every 4-6 hours as needed - Changes during respiratory infections or worsening symptoms: Increase Flovent 1147m to 4 puffs twice daily for TWO WEEKS. - Asthma control goals:  * Full participation in all desired activities (may need albuterol before activity) * Albuterol use two time or less a week on average (not counting use with activity) * Cough interfering with sleep two time or less a month * Oral steroids no more than once a year * No hospitalizations  3. Return in about 6 months (around 05/18/2023).    Please inform us[facility]oreaf any Emergency Department visits, hospitalizations, or changes in symptoms. Call usKoreaefore going to the ED for breathing or allergy symptoms since we might be able to fit you in for a sick visit. Feel free to contact usKoreanytime with any questions, problems, or concerns.  It was a pleasure to see you again today!  Websites that have reliable patient information: 1. American Academy of Asthma, Allergy, and Immunology: www.aaaai.org 2. Food Allergy Research and Education (FARE): foodallergy.org 3. Mothers of Asthmatics: http://www.asthmacommunitynetwork.org 4. American College of Allergy, Asthma, and Immunology: www.acaai.org   COVID-19 Vaccine Information can be found at: [email or web address] questions related to vaccine distribution or appointments, please  email [email or web address] or call [phone]  We realize that you might be concerned about having an allergic reaction to the COVID19 vaccines. To help with that concern, WE ARE OFFERING THE COVID19 VACCINES IN OUR OFFICE! Ask the front desk for dates!     "Like" us[facility] Facebook and Instagram for our latest updates!      A healthy democracy works best when ALNew York Life Insurancearticipate! Make sure you are registered to vote! If you have moved or changed any of your contact information, you will need to get this updated before voting!  In some cases, you MAY be able to register to vote online: [email or web address]

## 2022-11-17 NOTE — Progress Notes (Signed)
FOLLOW UP  Date of Service/Encounter:  11/17/22   Assessment:   Moderate persistent asthma, uncomplicated - present before COVID19 infection, but worsening since the COVID19 infection with current nagging cough that is worse with physical activity   IgE (October 2021) of 1557 (collected PRIOR to Xolair initiation for her urticaria)   Inability to tolerate combination inhalers (worsening thrush/sore throat)   Chronic idiopathic urticaria - improved by not fully resolved on Xolair every 28 days   Chronic rhinitis - seemingly improved on the Xolair for her hives   Long COVID syndrome    Plan/Recommendations:   1. Post COVID syndrome with urticarial rash - Continue with Xolair every two weeks.  - Continue with cetirizine '10mg'$  at night (you can add on a morning dose if you want to see if that helps).   2. Moderate persistent asthma, uncomplicated - Lung testing not done. - I think we are on a good track with your breathing.  - Pack of prednisone provided in case you need it while on your trip.  - Daily controller medication(s): Flovent 139mg 2 puffs twice daily with spacer and Accolate twice daily - Prior to physical activity: albuterol 2 puffs 10-15 minutes before physical activity. - Rescue medications: albuterol 4 puffs every 4-6 hours as needed - Changes during respiratory infections or worsening symptoms: Increase Flovent 1118m to 4 puffs twice daily for TWO WEEKS. - Asthma control goals:  * Full participation in all desired activities (may need albuterol before activity) * Albuterol use two time or less a week on average (not counting use with activity) * Cough interfering with sleep two time or less a month * Oral steroids no more than once a year * No hospitalizations  3. Return in about 6 months (around 05/18/2023).   Subjective:   Rachel STOKERs a [age].o. female presenting today for follow up of  Chief Complaint  Patient presents with   Follow-up     SuSAE HANDRICHas a history of the following: Patient Active Problem List   Diagnosis Date Noted   Dry eyes 05/12/2022   Asthma 05/12/2022   Fibromyalgia 05/12/2022   Hyperlipidemia 05/12/2022   Osteoporosis 03/13/2021   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 hauler 12/25/2020   Chronic urticaria 12/25/2020   Moderate persistent asthma, uncomplicated 0300/71/2197 Ageusia 08/12/2020   Anosmia 08/12/2020   Myalgia, epidemic 08/12/2020   Cough with exposure to COVID-19 virus 08/12/2020   Olfactory impairment 05/29/2020   Acute UTI 04/23/2020   Left ureteral stone 04/23/2020   Intermittent chest pain 02/09/2020   History of COVID-19 02/09/2020   Shortness of breath 02/09/2020   Loss of taste 02/09/2020   Loss of smell 02/09/2020   Rash 02/09/2020   Arthralgia 02/09/2020   Benign essential hypertension 10/25/2019   Gastroesophageal reflux disease without esophagitis 06/23/2019   Chronic migraine 06/23/2019   Neuropathy 11/17/2018   Atypical facial pain 06/30/2018   Hypothyroidism 05/19/2018   Postoperative history of checked last year 04/22/2017   Weakness of left arm 04/22/2017   Numbness and tingling in left arm 04/22/2017   Lower back pain 04/02/2016   Acute stress disorder 02/25/2015   Anxiety 02/25/2015   Airway hyperreactivity 02/25/2015   Bradycardia 02/25/2015   Hypersomnia 02/25/2015   Clinical depression 02/25/2015   Elevated blood sugar 02/25/2015   Fibrositis 02/25/2015   Acid reflux 02/25/2015   Hepatitis non A non B 02/25/2015   H/O disease 02/25/2015   Hypercholesteremia 02/25/2015   Headache, migraine 02/25/2015  Muscle ache 02/25/2015   Allergic rhinitis, seasonal 02/25/2015   Avitaminosis D 02/25/2015    History obtained from: chart review and patient.  [name] is a 67 y.o. female presenting for a follow up visit.  She was last seen in October 2023 by Webb Silversmith one of our nurse practitioners.  At that time, she had her Xolair held.  Her cetirizine was increased to 10 mg  twice a day and she was continued on Pepcid.  For her asthma, her Flovent was increased to 2 puffs 3 times a day.  For her rhinitis, she was continued on cetirizine as well as Nasacort.  She will  Since last visit, she has mostly done well.   Asthma/Respiratory Symptom History: Asthma is well controlled. She does think that the Xolair has helped a lot of her post COVID cough. This has eased up somewhat.  She has done well with the Accolate. She remains on the Flovent, but she has not received notification that this is no longer going to be manufactured. She has not been on prednisone at all. She has failed ICS/LABA in the past. She had hyperreactivity to this when she was on it. She has been a few combined ICS/LABAs.   Allergic Rhinitis Symptom History: Her rhinitis symptoms have not been too severe. She has not had any antibiotics at all since the last visit.   Skin Symptom History: She has had no hives since starting the Xolair every two weeks. She did have an urticarial outbreak. She then increased to '300mg'$  every 2 weeks.  She is giving herself the injection without a problem. She has the copay card and she not been called by a collection agency. She has not had paid anything at all.   She is going to be traveling to [place] for two weeks in April. She is wondering about the timing of her Xolair since she cannot bring the Xolair with her (since it has to be refrigerated).   Work is going well. She is planning to retire in two years. Previously she was planning to work until she was in her 70s,but the long COVID syndrome has caused her to change her plan.   Otherwise, there have been no changes to her past medical history, surgical history, family history, or social history.    Review of Systems  Constitutional:  Negative for chills, fever, malaise/fatigue and weight loss.  HENT: Negative.  Negative for congestion, ear discharge and ear pain.   Eyes:  Negative for pain, discharge and  redness.  Respiratory:  Positive for cough. Negative for sputum production, shortness of breath and wheezing.   Cardiovascular: Negative.  Negative for chest pain and palpitations.  Gastrointestinal:  Negative for abdominal pain, constipation, diarrhea, heartburn, nausea and vomiting.  Skin:  Negative for itching and rash.  Neurological:  Negative for dizziness and headaches.  Endo/Heme/Allergies:  Positive for environmental allergies. Does not bruise/bleed easily.       Objective:   Blood pressure 122/86, pulse 77, temperature (!) 97.4 F (36.3 C), temperature source Oral, resp. rate 18, SpO2 97 %. There is no height or weight on file to calculate BMI.    Physical Exam Vitals reviewed.  Constitutional:      Appearance: She is well-developed.     Comments: Talkative.  HENT:     Head: Normocephalic and atraumatic.     Right Ear: Tympanic membrane, ear canal and external ear normal.     Left Ear: Tympanic membrane, ear canal and external ear normal.  Nose: Rhinorrhea present. No nasal deformity, septal deviation or mucosal edema.     Right Turbinates: Not enlarged or swollen.     Left Turbinates: Not enlarged or swollen.     Right Sinus: No maxillary sinus tenderness or frontal sinus tenderness.     Left Sinus: No maxillary sinus tenderness or frontal sinus tenderness.     Mouth/Throat:     Mouth: Mucous membranes are not pale and not dry.     Pharynx: Uvula midline.  Eyes:     General: Lids are normal. No allergic shiner.       Right eye: No discharge.        Left eye: No discharge.     Conjunctiva/sclera: Conjunctivae normal.     Right eye: Right conjunctiva is not injected. No chemosis.    Left eye: Left conjunctiva is not injected. No chemosis.    Pupils: Pupils are equal, round, and reactive to light.  Cardiovascular:     Rate and Rhythm: Normal rate and regular rhythm.     Heart sounds: Normal heart sounds.  Pulmonary:     Effort: Pulmonary effort is normal. No  tachypnea, accessory muscle usage or respiratory distress.     Breath sounds: Normal breath sounds. No wheezing, rhonchi or rales.     Comments: Moving air well in all lung fields. No increased work of breathing noted.  Chest:     Chest wall: No tenderness.  Lymphadenopathy:     Cervical: No cervical adenopathy.  Skin:    General: Skin is warm.     Capillary Refill: Capillary refill takes less than 2 seconds.     Coloration: Skin is not pale.     Findings: No abrasion, erythema, petechiae or rash. Rash is not papular, urticarial or vesicular.     Comments: No urticaria noted.   Neurological:     Mental Status: She is alert.  Psychiatric:        Behavior: Behavior is cooperative.      Diagnostic studies: none       Salvatore Marvel, MD  [facility]

## 2022-11-18 ENCOUNTER — Other Ambulatory Visit (HOSPITAL_COMMUNITY): Payer: Self-pay

## 2022-11-18 ENCOUNTER — Other Ambulatory Visit: Payer: Self-pay

## 2022-11-18 ENCOUNTER — Ambulatory Visit: Payer: 59

## 2022-11-18 NOTE — Therapy (Signed)
OUTPATIENT PHYSICAL THERAPY TREATMENT NOTE/RECERT   Patient Name: Rachel Fry MRN: [id number] DOB:[birth year], 67 y.o., female Today's Date: 11/19/2022  PCP: [name] MD  REFERRING PROVIDER: [name] MD    PT End of Session - 11/19/22 0706     Visit Number 36    Number of Visits 42    Date for PT Re-Evaluation 02/11/23    PT Start Time 0715    PT Stop Time 0758    PT Time Calculation (min) 43 min    Activity Tolerance Patient tolerated treatment well    Behavior During Therapy WFL for tasks assessed/performed                       Past Medical History:  Diagnosis Date   Allergy    Asthma    COVID-19    History of kidney stones    Hypothyroidism    Migraines    Numbness and tingling    left side of body, occasionally right side    S/P Botox injection    Urticaria    Past Surgical History:  Procedure Laterality Date   ADENOIDECTOMY     ARM NEUROPLASTY Right 1994 and 1996   for chronic pain   CESAREAN SECTION     CYSTOSCOPY W/ RETROGRADES Left 04/23/2020   Procedure: CYSTOSCOPY WITH RETROGRADE PYELOGRAM;  Surgeon: Abbie Sons, MD;  Location: [facility];  Service: Urology;  Laterality: Left;   CYSTOSCOPY W/ RETROGRADES Left 05/13/2020   Procedure: CYSTOSCOPY WITH RETROGRADE PYELOGRAM;  Surgeon: Hollice Espy, MD;  Location: [facility];  Service: Urology;  Laterality: Left;   CYSTOSCOPY WITH STENT PLACEMENT Left 04/23/2020   Procedure: CYSTOSCOPY WITH STENT PLACEMENT;  Surgeon: Abbie Sons, MD;  Location: [facility];  Service: Urology;  Laterality: Left;   CYSTOSCOPY/URETEROSCOPY/HOLMIUM LASER/STENT PLACEMENT Left 05/13/2020   Procedure: CYSTOSCOPY/URETEROSCOPY/LASER/STENT EXCHANGE;  Surgeon: Hollice Espy, MD;  Location: [facility];  Service: Urology;  Laterality: Left;   OVARIAN CYST REMOVAL  1973   STONE EXTRACTION WITH BASKET  05/13/2020   Procedure: STONE EXTRACTION WITH BASKET;  Surgeon: Hollice Espy, MD;  Location: [facility];   Service: Urology;;   [place]   Patient Active Problem List   Diagnosis Date Noted   Dry eyes 05/12/2022   Asthma 05/12/2022   Fibromyalgia 05/12/2022   Hyperlipidemia 05/12/2022   Osteoporosis 03/13/2021   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 hauler 12/25/2020   Chronic urticaria 12/25/2020   Moderate persistent asthma, uncomplicated 16/04/3709   Ageusia 08/12/2020   Anosmia 08/12/2020   Myalgia, epidemic 08/12/2020   Cough with exposure to COVID-19 virus 08/12/2020   Olfactory impairment 05/29/2020   Acute UTI 04/23/2020   Left ureteral stone 04/23/2020   Intermittent chest pain 02/09/2020   History of COVID-19 02/09/2020   Shortness of breath 02/09/2020   Loss of taste 02/09/2020   Loss of smell 02/09/2020   Rash 02/09/2020   Arthralgia 02/09/2020   Benign essential hypertension 10/25/2019   Gastroesophageal reflux disease without esophagitis 06/23/2019   Chronic migraine 06/23/2019   Neuropathy 11/17/2018   Atypical facial pain 06/30/2018   Hypothyroidism 05/19/2018   Postoperative history of checked last year 04/22/2017   Weakness of left arm 04/22/2017   Numbness and tingling in left arm 04/22/2017   Lower back pain 04/02/2016   Acute stress disorder 02/25/2015   Anxiety 02/25/2015   Airway hyperreactivity 02/25/2015   Bradycardia 02/25/2015  Hypersomnia 02/25/2015   Clinical depression 02/25/2015   Elevated blood sugar 02/25/2015   Fibrositis 02/25/2015   Acid reflux 02/25/2015   Hepatitis non A non B 02/25/2015   H/O disease 02/25/2015   Hypercholesteremia 02/25/2015   Headache, migraine 02/25/2015   Muscle ache 02/25/2015   Allergic rhinitis, seasonal 02/25/2015   Avitaminosis D 02/25/2015    REFERRING DIAG: Migraine  THERAPY DIAG:  Abnormal posture  Nonintractable headache, unspecified chronicity pattern, unspecified headache type  Cervicalgia  Chronic right shoulder pain  Rationale for  Evaluation and Treatment Rehabilitation  PERTINENT HISTORY: Patient is a 67 year old female who presents to physical therapy for migraines with specific referral for dry needling.  Patient has been having migraines for about 14-15 years. They have changed from front temporal all day long 2-3/10, a couple years ago started having tingling in left side of body, numbness in arm and leg. Additionally sometimes get's belly aches.  Changed medication in 2020. Was getting trigger injections but it was too expensive. PMH of HLD, aortic atherosclerosis, asthma, COVID 19, SOB, hypothyroidism, atypical facial pain (2019), neuropathy (2020), fibromyalgia   PRECAUTIONS: n/a  SUBJECTIVE: Patient has had multiple headaches this past week, seeing neurologist today. In general has had an improvement in migraine frequency and ability to work prior to this week.   PAIN:  Are you having pain? Yes: NPRS scale: 2/10 Pain location: head Pain description: migraine Aggravating factors: stress, tension Relieving factors: heat, rest     TODAY'S TREATMENT:    GOALS Performed: see below      Right Left  Flexion 82  Extension 55  Side Bending 35 34  Rotation 60 52    Treatment  Suboccipital release 4x30 seconds J mobilization for postural kyphosis reduction 4x 30 seconds cervical and thoracic mobilizations grade II for reduced hypomobility x 8 minutes Palpation for trigger points       Trigger Point Dry Needling (TDN), unbilled Education performed with patient regarding potential benefit of TDN. Reviewed precautions and risks with patient. Reviewed special precautions/risks over lung fields which include pneumothorax. Reviewed signs and symptoms of pneumothorax and advised pt to go to ER immediately if these symptoms develop advise them of dry needling treatment. Extensive time spent with pt to ensure full understanding of TDN risks. Pt provided verbal consent to treatment. TDN performed to  with 0.30 x  30 and .2 x 0.3 single needle placements with local twitch response (LTR). Pistoning technique utilized. Improved pain-free motion following intervention. L and Rcervical paraspinals, Upper trap (L and R), temporalis, suboccipital musculature  x  10 minutes       PATIENT EDUCATION: Education details: exercise technique, TDN Person educated: Patient Education method: Explanation, Demonstration, Tactile cues, and Verbal cues Education comprehension: verbalized understanding, returned demonstration, verbal cues required, and tactile cues required   HOME EXERCISE PROGRAM: See prior sessions    PT Short Term Goals      PT SHORT TERM GOAL #1   Title Pt will be independent with HEP in order to decrease pain in order to improve pain-free function at home and work.    Baseline 6/7: HEP given 9/1: HEp compliant 11/15: HEP compliant    Time 6    Period Weeks    Status Achieved    Target Date 08/07/21              PT Long Term Goals -      PT LONG TERM GOAL #1   Title Patient will increase  FOTO score to equal to or greater than 59%    to demonstrate statistically significant improvement in mobility and quality of life.    Baseline 6/7: 50% 9/1: 59% 11/15: 86% 2/14: 71%    Time 12    Period Weeks    Status Achieved      PT LONG TERM GOAL #2   Title Pt will decrease worst pain as reported on NPRS by at least 3 points (3/10) in order to demonstrate clinically significant reduction in pain.    Baseline 6/7: 6/10 9/1: 4/10 11/15: 5/10 2/14: 3-4/10 4/25: 6/10 10/31: 5/10 1/25: 4-5/10   Time 12    Period Weeks    Status Partially Met    Target Date 02/11/2023         PT LONG TERM GOAL #3   Title Pt will demonstrate pain-free full range cervical motion in order to perform IADLs such as driving and household chores without increase in symptoms.    Baseline 6/7: see note for limitation 9/1:flexion 54, extension 60, SB R 35 L 30, rotation R 51 L 62 11/15: flexion 58, extension 60, SB  R and L 35, R rotation 59 L rotation 60 2/14: flexion 70, extension 60 SB R and L 38, R rotation 59 L 64 4/25: see note 10/31: unable to find tool for ROM assessment, will perform next session.  1/25:look above    Time 12    Period Weeks    Status Partially Met    Target Date 02/11/2023         PT LONG TERM GOAL #4   Title Patient will have less than two days a week of high pain migraines and demonstrate ability to self control/contain migraines.    Baseline 9/1:every day getting the mild ones, 2x/week for high pain migraines. 11/15: two days of high pain in past month, has mini headaches every day 2/14: has worsening headaches in the evening but only 2-3 days of high pain 4/25: 2 days of high pain 10/31: 1-2 days a week 1/25: had 4 last week; but last month was averaging 1 per week.    Time 12    Period Weeks    Status Partially Met    Target Date 02/11/2023         PT LONG TERM GOAL #5   Title Patient will reduce Neck Disability Index score to <10% to demonstrate minimal disability with ADL's including improved sleeping tolerance, sitting tolerance, etc for better mobility at home and work.    Baseline 2/14: 18% 4/25: 16% 10/31: 16% 1/25: 14%   Time 12    Period Weeks    Status Partially Met    Target Date 02/11/2023       PT LONG TERM GOAL #6  Title Patient will tolerate three hours in clinic with fluorescent lights with pain increase of <3/10 for improved quality of life with work   Baseline 10/31: pain worsens with fluorescent lights. 1/25: 2 hours with pain increase  Time 12   Period Weeks   Status Partially met  Target Date 02/11/2023                Plan     Clinical Impression Statement Patient's pain is improving with decreased frequency of high pain/severe migraines with exception of past week. Patient has had increase in past week, will be going to neurologist due to increase in migraines. Patient is able to tolerate two hours of computer work prior to  needing a  break from pain increase indicating improved carryover to work setting.  She will benefit from skilled physical therapy to reduce pain, improve patient independence with symptom management, and improve quality of life    Personal Factors and Comorbidities Age;Behavior Pattern;Comorbidity 3+;Fitness;Past/Current Experience;Profession;Time since onset of injury/illness/exacerbation    Comorbidities HLD, aortic atherosclerosis, asthma, COVID 19, SOB, hypothyroidism, atypical facial pain (2019), neuropathy (2020), fibromyalgia    Examination-Activity Limitations Caring for Others;Carry;Lift;Sleep;Transfers    Examination-Participation Restrictions Cleaning;Community Activity;Interpersonal Relationship;Driving;Laundry;Shop;Occupation;Meal Prep;Yard Work    [employer] officer Evolving/Moderate complexity    Rehab Potential Fair    PT Frequency Biweekly    PT Duration 12 weeks    PT Treatment/Interventions ADLs/Self Care Home Management;Dry needling;Manual techniques;Therapeutic exercise;Therapeutic activities;Neuromuscular re-education;Aquatic Therapy;Biofeedback;Canalith Repostioning;Cryotherapy;Electrical Stimulation;Iontophoresis '4mg'$ /ml Dexamethasone;Moist Heat;Ultrasound;Patient/family education;Passive range of motion;Energy conservation;Vestibular;Joint Manipulations;Traction;Gait training;Balance training;Taping;Spinal Manipulations;Visual/perceptual remediation/compensation    PT Next Visit Plan exercise technique, body mechanics    Consulted and Agree with Plan of Care Patient              Janna Arch, PT 11/19/2022, 12:26 PM

## 2022-11-19 ENCOUNTER — Ambulatory Visit: Payer: 59

## 2022-11-19 DIAGNOSIS — G8929 Other chronic pain: Secondary | ICD-10-CM

## 2022-11-19 DIAGNOSIS — M542 Cervicalgia: Secondary | ICD-10-CM | POA: Diagnosis not present

## 2022-11-19 DIAGNOSIS — R519 Headache, unspecified: Secondary | ICD-10-CM

## 2022-11-19 DIAGNOSIS — R293 Abnormal posture: Secondary | ICD-10-CM

## 2022-11-19 DIAGNOSIS — G43709 Chronic migraine without aura, not intractable, without status migrainosus: Secondary | ICD-10-CM | POA: Diagnosis not present

## 2022-11-19 DIAGNOSIS — M25511 Pain in right shoulder: Secondary | ICD-10-CM | POA: Diagnosis not present

## 2022-11-23 ENCOUNTER — Other Ambulatory Visit (HOSPITAL_COMMUNITY): Payer: Self-pay

## 2022-11-24 ENCOUNTER — Other Ambulatory Visit: Payer: Self-pay

## 2022-11-24 ENCOUNTER — Other Ambulatory Visit (HOSPITAL_COMMUNITY): Payer: Self-pay

## 2022-11-25 ENCOUNTER — Other Ambulatory Visit: Payer: Self-pay

## 2022-11-25 ENCOUNTER — Other Ambulatory Visit (HOSPITAL_COMMUNITY): Payer: Self-pay

## 2022-11-27 ENCOUNTER — Other Ambulatory Visit: Payer: Self-pay | Admitting: Physician Assistant

## 2022-11-27 DIAGNOSIS — G43709 Chronic migraine without aura, not intractable, without status migrainosus: Secondary | ICD-10-CM

## 2022-12-01 ENCOUNTER — Ambulatory Visit: Payer: 59 | Attending: Internal Medicine

## 2022-12-01 DIAGNOSIS — R519 Headache, unspecified: Secondary | ICD-10-CM | POA: Insufficient documentation

## 2022-12-01 DIAGNOSIS — R293 Abnormal posture: Secondary | ICD-10-CM | POA: Insufficient documentation

## 2022-12-01 DIAGNOSIS — M542 Cervicalgia: Secondary | ICD-10-CM | POA: Diagnosis not present

## 2022-12-01 DIAGNOSIS — G8929 Other chronic pain: Secondary | ICD-10-CM | POA: Insufficient documentation

## 2022-12-01 DIAGNOSIS — M25511 Pain in right shoulder: Secondary | ICD-10-CM | POA: Diagnosis not present

## 2022-12-01 NOTE — Therapy (Signed)
OUTPATIENT PHYSICAL THERAPY TREATMENT NOTE   Patient Name: Rachel Fry, 67 y.o., female MRN: [id number] DOB:[birth year], 67 y.o., female Today's Date: 12/01/2022  PCP: [name] MD  REFERRING PROVIDER: [name] MD    PT End of Session - 12/01/22 1341     Visit Number 37    Number of Visits 42    Date for PT Re-Evaluation 02/11/23    PT Start Time 1340    PT Stop Time 1424    PT Time Calculation (min) 44 min    Activity Tolerance Patient tolerated treatment well    Behavior During Therapy WFL for tasks assessed/performed                        Past Medical History:  Diagnosis Date   Allergy    Asthma    COVID-19    History of kidney stones    Hypothyroidism    Migraines    Numbness and tingling    left side of body, occasionally right side    S/P Botox injection    Urticaria    Past Surgical History:  Procedure Laterality Date   ADENOIDECTOMY     ARM NEUROPLASTY Right 1994 and 1996   for chronic pain   CESAREAN SECTION     CYSTOSCOPY W/ RETROGRADES Left 04/23/2020   Procedure: CYSTOSCOPY WITH RETROGRADE PYELOGRAM;  Surgeon: Abbie Sons, MD;  Location: [facility];  Service: Urology;  Laterality: Left;   CYSTOSCOPY W/ RETROGRADES Left 05/13/2020   Procedure: CYSTOSCOPY WITH RETROGRADE PYELOGRAM;  Surgeon: Hollice Espy, MD;  Location: [facility];  Service: Urology;  Laterality: Left;   CYSTOSCOPY WITH STENT PLACEMENT Left 04/23/2020   Procedure: CYSTOSCOPY WITH STENT PLACEMENT;  Surgeon: Abbie Sons, MD;  Location: [facility];  Service: Urology;  Laterality: Left;   CYSTOSCOPY/URETEROSCOPY/HOLMIUM LASER/STENT PLACEMENT Left 05/13/2020   Procedure: CYSTOSCOPY/URETEROSCOPY/LASER/STENT EXCHANGE;  Surgeon: Hollice Espy, MD;  Location: [facility];  Service: Urology;  Laterality: Left;   OVARIAN CYST REMOVAL  1973   STONE EXTRACTION WITH BASKET  05/13/2020   Procedure: STONE EXTRACTION WITH BASKET;  Surgeon: Hollice Espy, MD;  Location: [facility];   Service: Urology;;   [place]   Patient Active Problem List   Diagnosis Date Noted   Dry eyes 05/12/2022   Asthma 05/12/2022   Fibromyalgia 05/12/2022   Hyperlipidemia 05/12/2022   Osteoporosis 03/13/2021   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 hauler 12/25/2020   Chronic urticaria 12/25/2020   Moderate persistent asthma, uncomplicated 41/93/7902   Ageusia 08/12/2020   Anosmia 08/12/2020   Myalgia, epidemic 08/12/2020   Cough with exposure to COVID-19 virus 08/12/2020   Olfactory impairment 05/29/2020   Acute UTI 04/23/2020   Left ureteral stone 04/23/2020   Intermittent chest pain 02/09/2020   History of COVID-19 02/09/2020   Shortness of breath 02/09/2020   Loss of taste 02/09/2020   Loss of smell 02/09/2020   Rash 02/09/2020   Arthralgia 02/09/2020   Benign essential hypertension 10/25/2019   Gastroesophageal reflux disease without esophagitis 06/23/2019   Chronic migraine 06/23/2019   Neuropathy 11/17/2018   Atypical facial pain 06/30/2018   Hypothyroidism 05/19/2018   Postoperative history of checked last year 04/22/2017   Weakness of left arm 04/22/2017   Numbness and tingling in left arm 04/22/2017   Lower back pain 04/02/2016   Acute stress disorder 02/25/2015   Anxiety 02/25/2015   Airway hyperreactivity 02/25/2015   Bradycardia 02/25/2015  Hypersomnia 02/25/2015   Clinical depression 02/25/2015   Elevated blood sugar 02/25/2015   Fibrositis 02/25/2015   Acid reflux 02/25/2015   Hepatitis non A non B 02/25/2015   H/O disease 02/25/2015   Hypercholesteremia 02/25/2015   Headache, migraine 02/25/2015   Muscle ache 02/25/2015   Allergic rhinitis, seasonal 02/25/2015   Avitaminosis D 02/25/2015    REFERRING DIAG: Migraine  THERAPY DIAG:  Abnormal posture  Nonintractable headache, unspecified chronicity pattern, unspecified headache type  Cervicalgia  Chronic right shoulder pain  Rationale for  Evaluation and Treatment Rehabilitation  PERTINENT HISTORY: Patient is a 67 year old female who presents to physical therapy for migraines with specific referral for dry needling.  Patient has been having migraines for about 14-15 years. They have changed from front temporal all day long 2-3/10, a couple years ago started having tingling in left side of body, numbness in arm and leg. Additionally sometimes get's belly aches.  Changed medication in 2020. Was getting trigger injections but it was too expensive. PMH of HLD, aortic atherosclerosis, asthma, COVID 19, SOB, hypothyroidism, atypical facial pain (2019), neuropathy (2020), fibromyalgia   PRECAUTIONS: n/a  SUBJECTIVE:Patient is coming from work. Getting an MRI on Thursday due to headache behind eye.   PAIN:  Are you having pain? Yes: NPRS scale: 2/10 Pain location: head Pain description: migraine Aggravating factors: stress, tension Relieving factors: heat, rest     TODAY'S TREATMENT:         Right Left  Flexion 82  Extension 55  Side Bending 35 34  Rotation 60 52    Treatment Cervical side bend with overpressure to glenohumeral joint 2x 30 seconds Cervical rotation with overpressure to glenohumeral joint: 2x 30 seconds Suboccipital release 4x30 seconds J mobilization for postural kyphosis reduction 4x 30 seconds cervical and thoracic mobilizations grade II for reduced hypomobility x 4 minutes   Chin tucks 10x 5 second holds Cervical rotation with opposite scapular retraction 10x 5 second holds  Overhead Y 15x Arnold to pectoral stretch 10x 5 second holds      Trigger Point Dry Needling (TDN), unbilled Education performed with patient regarding potential benefit of TDN. Reviewed precautions and risks with patient. Reviewed special precautions/risks over lung fields which include pneumothorax. Reviewed signs and symptoms of pneumothorax and advised pt to go to ER immediately if these symptoms develop advise them of  dry needling treatment. Extensive time spent with pt to ensure full understanding of TDN risks. Pt provided verbal consent to treatment. TDN performed to  with 0.30 x 30 and .2 x 0.3 single needle placements with local twitch response (LTR). Pistoning technique utilized. Improved pain-free motion following intervention. L and Rcervical paraspinals, Upper trap (L and R), temporalis, suboccipital musculature  x  10 minutes       PATIENT EDUCATION: Education details: exercise technique, TDN Person educated: Patient Education method: Explanation, Demonstration, Tactile cues, and Verbal cues Education comprehension: verbalized understanding, returned demonstration, verbal cues required, and tactile cues required   HOME EXERCISE PROGRAM: See prior sessions    PT Short Term Goals      PT SHORT TERM GOAL #1   Title Pt will be independent with HEP in order to decrease pain in order to improve pain-free function at home and work.    Baseline 6/7: HEP given 9/1: HEp compliant 11/15: HEP compliant    Time 6    Period Weeks    Status Achieved    Target Date 08/07/21  PT Long Term Goals -      PT LONG TERM GOAL #1   Title Patient will increase FOTO score to equal to or greater than 59%    to demonstrate statistically significant improvement in mobility and quality of life.    Baseline 6/7: 50% 9/1: 59% 11/15: 86% 2/14: 71%    Time 12    Period Weeks    Status Achieved      PT LONG TERM GOAL #2   Title Pt will decrease worst pain as reported on NPRS by at least 3 points (3/10) in order to demonstrate clinically significant reduction in pain.    Baseline 6/7: 6/10 9/1: 4/10 11/15: 5/10 2/14: 3-4/10 4/25: 6/10 10/31: 5/10 1/25: 4-5/10   Time 12    Period Weeks    Status Partially Met    Target Date 02/11/2023         PT LONG TERM GOAL #3   Title Pt will demonstrate pain-free full range cervical motion in order to perform IADLs such as driving and household chores  without increase in symptoms.    Baseline 6/7: see note for limitation 9/1:flexion 54, extension 60, SB R 35 L 30, rotation R 51 L 62 11/15: flexion 58, extension 60, SB R and L 35, R rotation 59 L rotation 60 2/14: flexion 70, extension 60 SB R and L 38, R rotation 59 L 64 4/25: see note 10/31: unable to find tool for ROM assessment, will perform next session.  1/25:look above    Time 12    Period Weeks    Status Partially Met    Target Date 02/11/2023         PT LONG TERM GOAL #4   Title Patient will have less than two days a week of high pain migraines and demonstrate ability to self control/contain migraines.    Baseline 9/1:every day getting the mild ones, 2x/week for high pain migraines. 11/15: two days of high pain in past month, has mini headaches every day 2/14: has worsening headaches in the evening but only 2-3 days of high pain 4/25: 2 days of high pain 10/31: 1-2 days a week 1/25: had 4 last week; but last month was averaging 1 per week.    Time 12    Period Weeks    Status Partially Met    Target Date 02/11/2023         PT LONG TERM GOAL #5   Title Patient will reduce Neck Disability Index score to <10% to demonstrate minimal disability with ADL's including improved sleeping tolerance, sitting tolerance, etc for better mobility at home and work.    Baseline 2/14: 18% 4/25: 16% 10/31: 16% 1/25: 14%   Time 12    Period Weeks    Status Partially Met    Target Date 02/11/2023       PT LONG TERM GOAL #6  Title Patient will tolerate three hours in clinic with fluorescent lights with pain increase of <3/10 for improved quality of life with work   Baseline 10/31: pain worsens with fluorescent lights. 1/25: 2 hours with pain increase  Time 12   Period Weeks   Status Partially met  Target Date 02/11/2023                Plan     Clinical Impression Statement Patient has multiple large trigger points in bilateral upper trap this session. Multiple cavitations with  mobilizations noted with patient reporting relief. Demonstrates understanding of self stretching  for home. She will benefit from skilled physical therapy to reduce pain, improve patient independence with symptom management, and improve quality of life    Personal Factors and Comorbidities Age;Behavior Pattern;Comorbidity 3+;Fitness;Past/Current Experience;Profession;Time since onset of injury/illness/exacerbation    Comorbidities HLD, aortic atherosclerosis, asthma, COVID 19, SOB, hypothyroidism, atypical facial pain (2019), neuropathy (2020), fibromyalgia    Examination-Activity Limitations Caring for Others;Carry;Lift;Sleep;Transfers    Examination-Participation Restrictions Cleaning;Community Activity;Interpersonal Relationship;Driving;Laundry;Shop;Occupation;Meal Prep;Yard Work    [employer] officer Evolving/Moderate complexity    Rehab Potential Fair    PT Frequency Biweekly    PT Duration 12 weeks    PT Treatment/Interventions ADLs/Self Care Home Management;Dry needling;Manual techniques;Therapeutic exercise;Therapeutic activities;Neuromuscular re-education;Aquatic Therapy;Biofeedback;Canalith Repostioning;Cryotherapy;Electrical Stimulation;Iontophoresis '4mg'$ /ml Dexamethasone;Moist Heat;Ultrasound;Patient/family education;Passive range of motion;Energy conservation;Vestibular;Joint Manipulations;Traction;Gait training;Balance training;Taping;Spinal Manipulations;Visual/perceptual remediation/compensation    PT Next Visit Plan exercise technique, body mechanics    Consulted and Agree with Plan of Care Patient              Janna Arch, PT 12/01/2022, 2:24 PM

## 2022-12-03 ENCOUNTER — Ambulatory Visit
Admission: RE | Admit: 2022-12-03 | Discharge: 2022-12-03 | Disposition: A | Discharge status: Home / Self Care | Payer: 59 | Source: Ambulatory Visit | Attending: Physician Assistant | Admitting: Physician Assistant

## 2022-12-03 DIAGNOSIS — G43709 Chronic migraine without aura, not intractable, without status migrainosus: Secondary | ICD-10-CM | POA: Diagnosis not present

## 2022-12-03 DIAGNOSIS — G43909 Migraine, unspecified, not intractable, without status migrainosus: Secondary | ICD-10-CM | POA: Diagnosis not present

## 2022-12-03 MED ORDER — GADOBUTROL 1 MMOL/ML IV SOLN
7.0000 mL | Freq: Once | INTRAVENOUS | Status: AC | PRN
  Administered 2022-12-03: 7 mL via INTRAVENOUS

## 2022-12-09 ENCOUNTER — Other Ambulatory Visit (HOSPITAL_COMMUNITY): Payer: Self-pay

## 2022-12-10 ENCOUNTER — Other Ambulatory Visit (HOSPITAL_COMMUNITY): Payer: Self-pay

## 2022-12-10 DIAGNOSIS — R110 Nausea: Secondary | ICD-10-CM | POA: Diagnosis not present

## 2022-12-10 DIAGNOSIS — R609 Edema, unspecified: Secondary | ICD-10-CM | POA: Diagnosis not present

## 2022-12-10 DIAGNOSIS — R42 Dizziness and giddiness: Secondary | ICD-10-CM | POA: Diagnosis not present

## 2022-12-10 DIAGNOSIS — M151 Heberden's nodes (with arthropathy): Secondary | ICD-10-CM | POA: Diagnosis not present

## 2022-12-10 DIAGNOSIS — E039 Hypothyroidism, unspecified: Secondary | ICD-10-CM | POA: Diagnosis not present

## 2022-12-10 MED ORDER — MECLIZINE HCL 25 MG PO TABS
25.0000 mg | ORAL_TABLET | Freq: Three times a day (TID) | ORAL | 1 refills | Status: DC | PRN
  Filled 2022-12-10: qty 90, 30d supply, fill #0

## 2022-12-10 MED ORDER — ONDANSETRON HCL 8 MG PO TABS
8.0000 mg | ORAL_TABLET | Freq: Two times a day (BID) | ORAL | 0 refills | Status: DC | PRN
  Filled 2022-12-10: qty 20, 10d supply, fill #0

## 2022-12-10 MED ORDER — TORSEMIDE 20 MG PO TABS
40.0000 mg | ORAL_TABLET | Freq: Every day | ORAL | 1 refills | Status: AC | PRN
  Filled 2022-12-10: qty 180, 90d supply, fill #0
  Filled 2023-10-26: qty 180, 90d supply, fill #1

## 2022-12-10 NOTE — Therapy (Incomplete)
OUTPATIENT PHYSICAL THERAPY TREATMENT NOTE   Patient Name: Rachel Fry MRN: [id number] DOB:Nov 12, 1955, 67 y.o., female Today's Date: 12/10/2022  PCP: [name] MD  REFERRING PROVIDER: [name] MD                 Past Medical History:  Diagnosis Date   Allergy    Asthma    COVID-19    History of kidney stones    Hypothyroidism    Migraines    Numbness and tingling    left side of body, occasionally right side    S/P Botox injection    Urticaria    Past Surgical History:  Procedure Laterality Date   ADENOIDECTOMY     ARM NEUROPLASTY Right 1994 and 1996   for chronic pain   CESAREAN SECTION     CYSTOSCOPY W/ RETROGRADES Left 04/23/2020   Procedure: CYSTOSCOPY WITH RETROGRADE PYELOGRAM;  Surgeon: Abbie Sons, MD;  Location: [facility];  Service: Urology;  Laterality: Left;   CYSTOSCOPY W/ RETROGRADES Left 05/13/2020   Procedure: CYSTOSCOPY WITH RETROGRADE PYELOGRAM;  Surgeon: Hollice Espy, MD;  Location: [facility];  Service: Urology;  Laterality: Left;   CYSTOSCOPY WITH STENT PLACEMENT Left 04/23/2020   Procedure: CYSTOSCOPY WITH STENT PLACEMENT;  Surgeon: Abbie Sons, MD;  Location: [facility];  Service: Urology;  Laterality: Left;   CYSTOSCOPY/URETEROSCOPY/HOLMIUM LASER/STENT PLACEMENT Left 05/13/2020   Procedure: CYSTOSCOPY/URETEROSCOPY/LASER/STENT EXCHANGE;  Surgeon: Hollice Espy, MD;  Location: [facility];  Service: Urology;  Laterality: Left;   OVARIAN CYST REMOVAL  1973   STONE EXTRACTION WITH BASKET  05/13/2020   Procedure: STONE EXTRACTION WITH BASKET;  Surgeon: Hollice Espy, MD;  Location: [facility];  Service: Urology;;   [place]   Patient Active Problem List   Diagnosis Date Noted   Dry eyes 05/12/2022   Asthma 05/12/2022   Fibromyalgia 05/12/2022   Hyperlipidemia 05/12/2022   Osteoporosis 03/13/2021   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 hauler 12/25/2020   Chronic urticaria  12/25/2020   Moderate persistent asthma, uncomplicated 0000000   Ageusia 08/12/2020   Anosmia 08/12/2020   Myalgia, epidemic 08/12/2020   Cough with exposure to COVID-19 virus 08/12/2020   Olfactory impairment 05/29/2020   Acute UTI 04/23/2020   Left ureteral stone 04/23/2020   Intermittent chest pain 02/09/2020   History of COVID-19 02/09/2020   Shortness of breath 02/09/2020   Loss of taste 02/09/2020   Loss of smell 02/09/2020   Rash 02/09/2020   Arthralgia 02/09/2020   Benign essential hypertension 10/25/2019   Gastroesophageal reflux disease without esophagitis 06/23/2019   Chronic migraine 06/23/2019   Neuropathy 11/17/2018   Atypical facial pain 06/30/2018   Hypothyroidism 05/19/2018   Postoperative history of checked last year 04/22/2017   Weakness of left arm 04/22/2017   Numbness and tingling in left arm 04/22/2017   Lower back pain 04/02/2016   Acute stress disorder 02/25/2015   Anxiety 02/25/2015   Airway hyperreactivity 02/25/2015   Bradycardia 02/25/2015   Hypersomnia 02/25/2015   Clinical depression 02/25/2015   Elevated blood sugar 02/25/2015   Fibrositis 02/25/2015   Acid reflux 02/25/2015   Hepatitis non A non B 02/25/2015   H/O disease 02/25/2015   Hypercholesteremia 02/25/2015   Headache, migraine 02/25/2015   Muscle ache 02/25/2015   Allergic rhinitis, seasonal 02/25/2015   Avitaminosis D 02/25/2015    REFERRING DIAG: Migraine  THERAPY DIAG:  No diagnosis found.  Rationale for Evaluation and Treatment  Rehabilitation  PERTINENT HISTORY: Patient is a 67 year old female who presents to physical therapy for migraines with specific referral for dry needling.  Patient has been having migraines for about 14-15 years. They have changed from front temporal all day long 2-3/10, a couple years ago started having tingling in left side of body, numbness in arm and leg. Additionally sometimes get's belly aches.  Changed medication in 2020. Was getting  trigger injections but it was too expensive. PMH of HLD, aortic atherosclerosis, asthma, COVID 19, SOB, hypothyroidism, atypical facial pain (2019), neuropathy (2020), fibromyalgia   PRECAUTIONS: n/a  SUBJECTIVE:***  PAIN:  Are you having pain? Yes: NPRS scale: 2/10 Pain location: head Pain description: migraine Aggravating factors: stress, tension Relieving factors: heat, rest     TODAY'S TREATMENT:         Right Left  Flexion 82  Extension 55  Side Bending 35 34  Rotation 60 52    Treatment Cervical side bend with overpressure to glenohumeral joint 2x 30 seconds Cervical rotation with overpressure to glenohumeral joint: 2x 30 seconds Suboccipital release 4x30 seconds J mobilization for postural kyphosis reduction 4x 30 seconds cervical and thoracic mobilizations grade II for reduced hypomobility x 4 minutes   Chin tucks 10x 5 second holds Cervical rotation with opposite scapular retraction 10x 5 second holds  Overhead Y 15x Arnold to pectoral stretch 10x 5 second holds      Trigger Point Dry Needling (TDN), unbilled Education performed with patient regarding potential benefit of TDN. Reviewed precautions and risks with patient. Reviewed special precautions/risks over lung fields which include pneumothorax. Reviewed signs and symptoms of pneumothorax and advised pt to go to ER immediately if these symptoms develop advise them of dry needling treatment. Extensive time spent with pt to ensure full understanding of TDN risks. Pt provided verbal consent to treatment. TDN performed to  with 0.30 x 30 and .2 x 0.3 single needle placements with local twitch response (LTR). Pistoning technique utilized. Improved pain-free motion following intervention. L and Rcervical paraspinals, Upper trap (L and R), temporalis, suboccipital musculature  x  10 minutes       PATIENT EDUCATION: Education details: exercise technique, TDN Person educated: Patient Education method:  Explanation, Demonstration, Tactile cues, and Verbal cues Education comprehension: verbalized understanding, returned demonstration, verbal cues required, and tactile cues required   HOME EXERCISE PROGRAM: See prior sessions    PT Short Term Goals      PT SHORT TERM GOAL #1   Title Pt will be independent with HEP in order to decrease pain in order to improve pain-free function at home and work.    Baseline 6/7: HEP given 9/1: HEp compliant 11/15: HEP compliant    Time 6    Period Weeks    Status Achieved    Target Date 08/07/21              PT Long Term Goals -      PT LONG TERM GOAL #1   Title Patient will increase FOTO score to equal to or greater than 59%    to demonstrate statistically significant improvement in mobility and quality of life.    Baseline 6/7: 50% 9/1: 59% 11/15: 86% 2/14: 71%    Time 12    Period Weeks    Status Achieved      PT LONG TERM GOAL #2   Title Pt will decrease worst pain as reported on NPRS by at least 3 points (3/10) in order to demonstrate  clinically significant reduction in pain.    Baseline 6/7: 6/10 9/1: 4/10 11/15: 5/10 2/14: 3-4/10 4/25: 6/10 10/31: 5/10 1/25: 4-5/10   Time 12    Period Weeks    Status Partially Met    Target Date 02/11/2023         PT LONG TERM GOAL #3   Title Pt will demonstrate pain-free full range cervical motion in order to perform IADLs such as driving and household chores without increase in symptoms.    Baseline 6/7: see note for limitation 9/1:flexion 54, extension 60, SB R 35 L 30, rotation R 51 L 62 11/15: flexion 58, extension 60, SB R and L 35, R rotation 59 L rotation 60 2/14: flexion 70, extension 60 SB R and L 38, R rotation 59 L 64 4/25: see note 10/31: unable to find tool for ROM assessment, will perform next session.  1/25:look above    Time 12    Period Weeks    Status Partially Met    Target Date 02/11/2023         PT LONG TERM GOAL #4   Title Patient will have less than two days a  week of high pain migraines and demonstrate ability to self control/contain migraines.    Baseline 9/1:every day getting the mild ones, 2x/week for high pain migraines. 11/15: two days of high pain in past month, has mini headaches every day 2/14: has worsening headaches in the evening but only 2-3 days of high pain 4/25: 2 days of high pain 10/31: 1-2 days a week 1/25: had 4 last week; but last month was averaging 1 per week.    Time 12    Period Weeks    Status Partially Met    Target Date 02/11/2023         PT LONG TERM GOAL #5   Title Patient will reduce Neck Disability Index score to <10% to demonstrate minimal disability with ADL's including improved sleeping tolerance, sitting tolerance, etc for better mobility at home and work.    Baseline 2/14: 18% 4/25: 16% 10/31: 16% 1/25: 14%   Time 12    Period Weeks    Status Partially Met    Target Date 02/11/2023       PT LONG TERM GOAL #6  Title Patient will tolerate three hours in clinic with fluorescent lights with pain increase of <3/10 for improved quality of life with work   Baseline 10/31: pain worsens with fluorescent lights. 1/25: 2 hours with pain increase  Time 12   Period Weeks   Status Partially met  Target Date 02/11/2023                Plan     Clinical Impression Statement ***She will benefit from skilled physical therapy to reduce pain, improve patient independence with symptom management, and improve quality of life    Personal Factors and Comorbidities Age;Behavior Pattern;Comorbidity 3+;Fitness;Past/Current Experience;Profession;Time since onset of injury/illness/exacerbation    Comorbidities HLD, aortic atherosclerosis, asthma, COVID 19, SOB, hypothyroidism, atypical facial pain (2019), neuropathy (2020), fibromyalgia    Examination-Activity Limitations Caring for Others;Carry;Lift;Sleep;Transfers    Examination-Participation Restrictions Cleaning;Community Activity;Interpersonal  Relationship;Driving;Laundry;Shop;Occupation;Meal Prep;Yard Work    [employer] Evolving/Moderate complexity    Rehab Potential Fair    PT Frequency Biweekly    PT Duration 12 weeks    PT Treatment/Interventions ADLs/Self Care Home Management;Dry needling;Manual techniques;Therapeutic exercise;Therapeutic activities;Neuromuscular re-education;Aquatic Therapy;Biofeedback;Canalith Repostioning;Cryotherapy;Electrical Stimulation;Iontophoresis 29m/ml Dexamethasone;Moist Heat;Ultrasound;Patient/family education;Passive range of motion;Energy conservation;Vestibular;Joint Manipulations;Traction;Gait training;Balance training;Taping;Spinal Manipulations;Visual/perceptual  remediation/compensation    PT Next Visit Plan exercise technique, body mechanics    Consulted and Agree with Plan of Care Patient              Janna Arch, PT 12/10/2022, 5:16 PM

## 2022-12-12 ENCOUNTER — Other Ambulatory Visit (HOSPITAL_COMMUNITY): Payer: Self-pay

## 2022-12-14 ENCOUNTER — Other Ambulatory Visit: Payer: Self-pay

## 2022-12-14 ENCOUNTER — Other Ambulatory Visit (HOSPITAL_COMMUNITY): Payer: Self-pay

## 2022-12-14 ENCOUNTER — Ambulatory Visit: Payer: 59

## 2022-12-14 MED ORDER — EZETIMIBE 10 MG PO TABS
10.0000 mg | ORAL_TABLET | Freq: Every day | ORAL | 3 refills | Status: DC
  Filled 2022-12-14: qty 90, 90d supply, fill #0
  Filled 2023-03-04 – 2023-03-05 (×2): qty 90, 90d supply, fill #1
  Filled 2023-06-15: qty 90, 90d supply, fill #2
  Filled 2023-09-13: qty 90, 90d supply, fill #3

## 2022-12-15 ENCOUNTER — Other Ambulatory Visit: Payer: Self-pay

## 2022-12-15 ENCOUNTER — Ambulatory Visit: Payer: 59

## 2022-12-22 ENCOUNTER — Other Ambulatory Visit: Payer: Self-pay

## 2022-12-22 ENCOUNTER — Emergency Department
Admission: EM | Admit: 2022-12-22 | Discharge: 2022-12-22 | Disposition: A | Discharge status: Home / Self Care | Payer: 59 | Attending: Emergency Medicine | Admitting: Emergency Medicine

## 2022-12-22 DIAGNOSIS — B379 Candidiasis, unspecified: Secondary | ICD-10-CM | POA: Insufficient documentation

## 2022-12-22 DIAGNOSIS — U071 COVID-19: Secondary | ICD-10-CM | POA: Insufficient documentation

## 2022-12-22 DIAGNOSIS — R5383 Other fatigue: Secondary | ICD-10-CM | POA: Diagnosis present

## 2022-12-22 DIAGNOSIS — B370 Candidal stomatitis: Secondary | ICD-10-CM

## 2022-12-22 DIAGNOSIS — R112 Nausea with vomiting, unspecified: Secondary | ICD-10-CM | POA: Insufficient documentation

## 2022-12-22 DIAGNOSIS — E860 Dehydration: Secondary | ICD-10-CM | POA: Diagnosis not present

## 2022-12-22 DIAGNOSIS — R531 Weakness: Secondary | ICD-10-CM

## 2022-12-22 LAB — RESP PANEL BY RT-PCR (RSV, FLU A&B, COVID)  RVPGX2
Influenza A by PCR: NEGATIVE
Influenza B by PCR: NEGATIVE
Resp Syncytial Virus by PCR: NEGATIVE
SARS Coronavirus 2 by RT PCR: NEGATIVE

## 2022-12-22 LAB — CBC
HCT: 51.4 % — ABNORMAL HIGH (ref 36.0–46.0)
Hemoglobin: 16.4 g/dL — ABNORMAL HIGH (ref 12.0–15.0)
MCH: 29.9 pg (ref 26.0–34.0)
MCHC: 31.9 g/dL (ref 30.0–36.0)
MCV: 93.6 fL (ref 80.0–100.0)
Platelets: 261 10*3/uL (ref 150–400)
RBC: 5.49 MIL/uL — ABNORMAL HIGH (ref 3.87–5.11)
RDW: 12.5 % (ref 11.5–15.5)
WBC: 8.1 10*3/uL (ref 4.0–10.5)
nRBC: 0 % (ref 0.0–0.2)

## 2022-12-22 LAB — COMPREHENSIVE METABOLIC PANEL
ALT: 206 U/L — ABNORMAL HIGH (ref 0–44)
AST: 120 U/L — ABNORMAL HIGH (ref 15–41)
Albumin: 4.5 g/dL (ref 3.5–5.0)
Alkaline Phosphatase: 60 U/L (ref 38–126)
Anion gap: 14 (ref 5–15)
BUN: 21 mg/dL (ref 8–23)
CO2: 28 mmol/L (ref 22–32)
Calcium: 10.8 mg/dL — ABNORMAL HIGH (ref 8.9–10.3)
Chloride: 94 mmol/L — ABNORMAL LOW (ref 98–111)
Creatinine, Ser: 1.19 mg/dL — ABNORMAL HIGH (ref 0.44–1.00)
GFR, Estimated: 50 mL/min — ABNORMAL LOW (ref >60)
Glucose, Bld: 139 mg/dL — ABNORMAL HIGH (ref 70–99)
Potassium: 3.5 mmol/L (ref 3.5–5.1)
Sodium: 136 mmol/L (ref 135–145)
Total Bilirubin: 1.1 mg/dL (ref 0.3–1.2)
Total Protein: 8.1 g/dL (ref 6.5–8.1)

## 2022-12-22 LAB — LIPASE, BLOOD: Lipase: 44 U/L (ref 11–51)

## 2022-12-22 MED ORDER — ONDANSETRON HCL 4 MG/2ML IJ SOLN
4.0000 mg | Freq: Once | INTRAMUSCULAR | Status: AC
  Administered 2022-12-22: 4 mg via INTRAVENOUS
  Filled 2022-12-22: qty 2

## 2022-12-22 MED ORDER — NYSTATIN 100000 UNIT/ML MT SUSP: 5.0000 mL | Freq: Four times a day (QID) | OROMUCOSAL | 0 refills | Status: DC

## 2022-12-22 MED ORDER — SODIUM CHLORIDE 0.9 % IV SOLN: Freq: Once | INTRAVENOUS | Status: AC

## 2022-12-22 NOTE — ED Provider Notes (Signed)
[facility] Provider Note    Event Date/Time   First MD Initiated Contact with Patient 12/22/22 1004     (approximate)   History   Covid Positive, Weakness, and Emesis   HPI  Rachel Fry is a 67 y.o. female who presents with complaints of fatigue weakness nausea vomiting.  Patient reports she had positive COVID test 1 week ago, she did take Paxlovid and has had nausea and vomiting during the course.  She finished the course over the weekend but continues to have nausea vomiting and feels fatigued and weak.  Occasional cough which has improved     Physical Exam   Triage Vital Signs: ED Triage Vitals  Enc Vitals Group     BP 12/22/22 0946 (!) 146/109     Pulse Rate 12/22/22 0946 98     Resp 12/22/22 0946 16     Temp 12/22/22 0946 98.1 F (36.7 C)     Temp Source 12/22/22 0946 Oral     SpO2 12/22/22 0946 97 %     Weight 12/22/22 0948 68.5 kg (151 lb)     Height 12/22/22 0948 1.651 m ('5\' 5"'$ )     Head Circumference --      Peak Flow --      Pain Score 12/22/22 0948 4     Pain Loc --      Pain Edu? --      Excl. in [place]? --     Most recent vital signs: Vitals:   12/22/22 0946 12/22/22 1156  BP: (!) 146/109 133/74  Pulse: 98 98  Resp: 16 16  Temp: 98.1 F (36.7 C)   SpO2: 97% 94%     General: Awake, no distress.  CV:  Good peripheral perfusion.  Resp:  Normal effort.  Abd:  No distention. Other:   Pharynx: Suspect thrush on the tongue  ED Results / Procedures / Treatments   Labs (all labs ordered are listed, but only abnormal results are displayed) Labs Reviewed  CBC - Abnormal; Notable for the following components:      Result Value   RBC 5.49 (*)    Hemoglobin 16.4 (*)    HCT 51.4 (*)    All other components within normal limits  COMPREHENSIVE METABOLIC PANEL - Abnormal; Notable for the following components:   Chloride 94 (*)    Glucose, Bld 139 (*)    Creatinine, Ser 1.19 (*)    Calcium 10.8 (*)    AST 120 (*)    ALT  206 (*)    GFR, Estimated 50 (*)    All other components within normal limits  RESP PANEL BY RT-PCR (RSV, FLU A&B, COVID)  RVPGX2  LIPASE, BLOOD     EKG     RADIOLOGY     PROCEDURES:  Critical Care performed:   Procedures   MEDICATIONS ORDERED IN ED: Medications  0.9 %  sodium chloride infusion (0 mLs Intravenous Stopped 12/22/22 1220)  ondansetron (ZOFRAN) injection 4 mg (4 mg Intravenous Given 12/22/22 1042)     IMPRESSION / MDM / ASSESSMENT AND PLAN / ED COURSE  I reviewed the triage vital signs and the nursing notes. Patient's presentation is most consistent with acute illness / injury with system symptoms.  Patient presents with symptoms as above in the setting of recent COVID viral illness.  I suspect patient is still suffering from impacts of the virus given she is only 6 to 7 days in.  Lab work obtained, IV  fluids and Zofran given  Lab work notable for elevated AST and ALT, mild elevation, discussed with patient that this will need to be followed up on, could be related to COVID and or Paxlovid, no abdominal pain in the right upper quadrant  Patient feeling improved after IV fluids, no indication for admission at this time, appropriate for discharge        FINAL CLINICAL IMPRESSION(S) / ED DIAGNOSES   Final diagnoses:  COVID-19  Weakness  Dehydration  Thrush     Rx / DC Orders   ED Discharge Orders          Ordered    nystatin (MYCOSTATIN) 100000 UNIT/ML suspension  4 times daily        12/22/22 1151             Note:  This document was prepared using Dragon voice recognition software and may include unintentional dictation errors.   Lavonia Drafts, MD 12/22/22 1455

## 2022-12-22 NOTE — ED Triage Notes (Addendum)
Pt states that she started feeling sick last Monday and tested pos for covid last Tuesday with a home test, pt states that she completed her paxlovid course yesterday and cont to vomit and not feel well, states that she has been unable to keep anything down and has lost 6 lbs. Pt reports that she feels dehydrated and is urinating less

## 2022-12-29 ENCOUNTER — Other Ambulatory Visit (HOSPITAL_COMMUNITY): Payer: Self-pay

## 2022-12-29 MED ORDER — SPIRONOLACTONE 50 MG PO TABS
50.0000 mg | ORAL_TABLET | Freq: Two times a day (BID) | ORAL | 3 refills | Status: DC
  Filled 2022-12-29: qty 180, 90d supply, fill #0
  Filled 2023-01-06: qty 180, 90d supply, fill #1
  Filled 2023-04-02: qty 180, 90d supply, fill #2
  Filled 2023-07-07: qty 180, 90d supply, fill #3

## 2022-12-31 ENCOUNTER — Ambulatory Visit: Payer: 59

## 2023-01-01 ENCOUNTER — Encounter: Payer: Self-pay | Admitting: Allergy & Immunology

## 2023-01-01 ENCOUNTER — Other Ambulatory Visit: Payer: Self-pay

## 2023-01-01 MED ORDER — SPACER/AERO-HOLDING CHAMBERS DEVI
1.0000 | 0 refills | Status: AC
  Filled 2023-01-01: qty 1, fill #0
  Filled 2023-01-05: qty 1, 30d supply, fill #0

## 2023-01-04 ENCOUNTER — Other Ambulatory Visit (HOSPITAL_COMMUNITY): Payer: Self-pay

## 2023-01-05 ENCOUNTER — Other Ambulatory Visit: Payer: Self-pay | Admitting: Family

## 2023-01-05 NOTE — Telephone Encounter (Signed)
Patient of Dr. Rockey Situ. Last OV 03/2021. Please review for refill. Thank you!  Also routing to scheduling for appt.

## 2023-01-06 ENCOUNTER — Other Ambulatory Visit (HOSPITAL_COMMUNITY): Payer: Self-pay

## 2023-01-06 ENCOUNTER — Other Ambulatory Visit: Payer: Self-pay

## 2023-01-06 DIAGNOSIS — M19041 Primary osteoarthritis, right hand: Secondary | ICD-10-CM | POA: Diagnosis not present

## 2023-01-06 DIAGNOSIS — M19042 Primary osteoarthritis, left hand: Secondary | ICD-10-CM | POA: Diagnosis not present

## 2023-01-06 MED ORDER — MELOXICAM 15 MG PO TABS
15.0000 mg | ORAL_TABLET | Freq: Every day | ORAL | 0 refills | Status: DC
  Filled 2023-01-06: qty 30, 30d supply, fill #0

## 2023-01-07 ENCOUNTER — Other Ambulatory Visit: Payer: Self-pay

## 2023-01-07 ENCOUNTER — Other Ambulatory Visit (HOSPITAL_COMMUNITY): Payer: Self-pay

## 2023-01-08 ENCOUNTER — Other Ambulatory Visit (HOSPITAL_COMMUNITY): Payer: Self-pay

## 2023-01-11 ENCOUNTER — Other Ambulatory Visit: Payer: Self-pay

## 2023-01-11 ENCOUNTER — Other Ambulatory Visit (HOSPITAL_COMMUNITY): Payer: Self-pay

## 2023-01-11 ENCOUNTER — Other Ambulatory Visit: Payer: Self-pay | Admitting: Family

## 2023-01-11 MED ORDER — LINZESS 145 MCG PO CAPS
145.0000 ug | ORAL_CAPSULE | Freq: Every day | ORAL | 5 refills | Status: DC
  Filled 2023-01-11: qty 30, 30d supply, fill #0
  Filled 2023-03-04 – 2023-03-05 (×2): qty 30, 30d supply, fill #1
  Filled 2023-04-09: qty 90, 90d supply, fill #2
  Filled 2023-07-01: qty 30, 30d supply, fill #3

## 2023-01-11 MED ORDER — VITAMIN D (ERGOCALCIFEROL) 1.25 MG (50000 UNIT) PO CAPS
50000.0000 [IU] | ORAL_CAPSULE | ORAL | 3 refills | Status: DC
  Filled 2023-01-11: qty 12, 84d supply, fill #0
  Filled 2023-04-09: qty 12, 84d supply, fill #1
  Filled 2023-07-31: qty 12, 84d supply, fill #2
  Filled 2023-12-06: qty 12, 84d supply, fill #3

## 2023-01-13 NOTE — Therapy (Signed)
OUTPATIENT PHYSICAL THERAPY TREATMENT NOTE   Patient Name: Rachel Fry MRN: [id number] DOB:07/29/1956, 67 y.o., female Today's Date: 01/14/2023  PCP: [name] MD  REFERRING PROVIDER: [name] MD    PT End of Session - 01/14/23 0714     Visit Number 38    Number of Visits 42    Date for PT Re-Evaluation 02/11/23    PT Start Time 0715    PT Stop Time 0758    PT Time Calculation (min) 43 min    Activity Tolerance Patient tolerated treatment well    Behavior During Therapy WFL for tasks assessed/performed                         Past Medical History:  Diagnosis Date   Allergy    Asthma    COVID-19    History of kidney stones    Hypothyroidism    Migraines    Numbness and tingling    left side of body, occasionally right side    S/P Botox injection    Urticaria    Past Surgical History:  Procedure Laterality Date   ADENOIDECTOMY     ARM NEUROPLASTY Right 1994 and 1996   for chronic pain   CESAREAN SECTION     CYSTOSCOPY W/ RETROGRADES Left 04/23/2020   Procedure: CYSTOSCOPY WITH RETROGRADE PYELOGRAM;  Surgeon: Abbie Sons, MD;  Location: [facility];  Service: Urology;  Laterality: Left;   CYSTOSCOPY W/ RETROGRADES Left 05/13/2020   Procedure: CYSTOSCOPY WITH RETROGRADE PYELOGRAM;  Surgeon: Hollice Espy, MD;  Location: [facility];  Service: Urology;  Laterality: Left;   CYSTOSCOPY WITH STENT PLACEMENT Left 04/23/2020   Procedure: CYSTOSCOPY WITH STENT PLACEMENT;  Surgeon: Abbie Sons, MD;  Location: [facility];  Service: Urology;  Laterality: Left;   CYSTOSCOPY/URETEROSCOPY/HOLMIUM LASER/STENT PLACEMENT Left 05/13/2020   Procedure: CYSTOSCOPY/URETEROSCOPY/LASER/STENT EXCHANGE;  Surgeon: Hollice Espy, MD;  Location: [facility];  Service: Urology;  Laterality: Left;   OVARIAN CYST REMOVAL  1973   STONE EXTRACTION WITH BASKET  05/13/2020   Procedure: STONE EXTRACTION WITH BASKET;  Surgeon: Hollice Espy, MD;  Location: [facility];   Service: Urology;;   [place]   Patient Active Problem List   Diagnosis Date Noted   Dry eyes 05/12/2022   Asthma 05/12/2022   Fibromyalgia 05/12/2022   Hyperlipidemia 05/12/2022   Osteoporosis 03/13/2021   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 hauler 12/25/2020   Chronic urticaria 12/25/2020   Moderate persistent asthma, uncomplicated 0000000   Ageusia 08/12/2020   Anosmia 08/12/2020   Myalgia, epidemic 08/12/2020   Cough with exposure to COVID-19 virus 08/12/2020   Olfactory impairment 05/29/2020   Acute UTI 04/23/2020   Left ureteral stone 04/23/2020   Intermittent chest pain 02/09/2020   History of COVID-19 02/09/2020   Shortness of breath 02/09/2020   Loss of taste 02/09/2020   Loss of smell 02/09/2020   Rash 02/09/2020   Arthralgia 02/09/2020   Benign essential hypertension 10/25/2019   Gastroesophageal reflux disease without esophagitis 06/23/2019   Chronic migraine 06/23/2019   Neuropathy 11/17/2018   Atypical facial pain 06/30/2018   Hypothyroidism 05/19/2018   Postoperative history of checked last year 04/22/2017   Weakness of left arm 04/22/2017   Numbness and tingling in left arm 04/22/2017   Lower back pain 04/02/2016   Acute stress disorder 02/25/2015   Anxiety 02/25/2015   Airway hyperreactivity 02/25/2015   Bradycardia  02/25/2015   Hypersomnia 02/25/2015   Clinical depression 02/25/2015   Elevated blood sugar 02/25/2015   Fibrositis 02/25/2015   Acid reflux 02/25/2015   Hepatitis non A non B 02/25/2015   H/O disease 02/25/2015   Hypercholesteremia 02/25/2015   Headache, migraine 02/25/2015   Muscle ache 02/25/2015   Allergic rhinitis, seasonal 02/25/2015   Avitaminosis D 02/25/2015    REFERRING DIAG: Migraine  THERAPY DIAG:  Abnormal posture  Cervicalgia  Nonintractable headache, unspecified chronicity pattern, unspecified headache type  Rationale for Evaluation and Treatment  Rehabilitation  PERTINENT HISTORY: Patient is a 67 year old female who presents to physical therapy for migraines with specific referral for dry needling.  Patient has been having migraines for about 14-15 years. They have changed from front temporal all day long 2-3/10, a couple years ago started having tingling in left side of body, numbness in arm and leg. Additionally sometimes get's belly aches.  Changed medication in 2020. Was getting trigger injections but it was too expensive. PMH of HLD, aortic atherosclerosis, asthma, COVID 19, SOB, hypothyroidism, atypical facial pain (2019), neuropathy (2020), fibromyalgia   PRECAUTIONS: n/a  SUBJECTIVE:Patient returning to therapy after almost two months absence due to covid.    PAIN:  Are you having pain? Yes: NPRS scale: 2/10 Pain location: head Pain description: migraine Aggravating factors: stress, tension Relieving factors: heat, rest     TODAY'S TREATMENT:         Right Left  Flexion 82  Extension 55  Side Bending 35 34  Rotation 60 52    Treatment Cervical side bend with overpressure to glenohumeral joint 2x 30 seconds Cervical rotation with overpressure to glenohumeral joint: 2x 30 seconds Suboccipital release 4x30 seconds J mobilization for postural kyphosis reduction 4x 30 seconds cervical and thoracic mobilizations grade II for reduced hypomobility x 4 minutes   Chin tucks 10x 5 second holds Cervical rotation with opposite scapular retraction 10x 5 second holds  Overhead Y 15x       Trigger Point Dry Needling (TDN), unbilled Education performed with patient regarding potential benefit of TDN. Reviewed precautions and risks with patient. Reviewed special precautions/risks over lung fields which include pneumothorax. Reviewed signs and symptoms of pneumothorax and advised pt to go to ER immediately if these symptoms develop advise them of dry needling treatment. Extensive time spent with pt to ensure full  understanding of TDN risks. Pt provided verbal consent to treatment. TDN performed to  with 0.30 x 30 and .2 x 0.3 single needle placements with local twitch response (LTR). Pistoning technique utilized. Improved pain-free motion following intervention. L and Rcervical paraspinals, Upper trap (L and R), temporalis, suboccipital musculature  x  10 minutes       PATIENT EDUCATION: Education details: exercise technique, TDN Person educated: Patient Education method: Explanation, Demonstration, Tactile cues, and Verbal cues Education comprehension: verbalized understanding, returned demonstration, verbal cues required, and tactile cues required   HOME EXERCISE PROGRAM: See prior sessions    PT Short Term Goals      PT SHORT TERM GOAL #1   Title Pt will be independent with HEP in order to decrease pain in order to improve pain-free function at home and work.    Baseline 6/7: HEP given 9/1: HEp compliant 11/15: HEP compliant    Time 6    Period Weeks    Status Achieved    Target Date 08/07/21              PT Long Term Goals -  PT LONG TERM GOAL #1   Title Patient will increase FOTO score to equal to or greater than 59%    to demonstrate statistically significant improvement in mobility and quality of life.    Baseline 6/7: 50% 9/1: 59% 11/15: 86% 2/14: 71%    Time 12    Period Weeks    Status Achieved      PT LONG TERM GOAL #2   Title Pt will decrease worst pain as reported on NPRS by at least 3 points (3/10) in order to demonstrate clinically significant reduction in pain.    Baseline 6/7: 6/10 9/1: 4/10 11/15: 5/10 2/14: 3-4/10 4/25: 6/10 10/31: 5/10 1/25: 4-5/10   Time 12    Period Weeks    Status Partially Met    Target Date 02/11/2023         PT LONG TERM GOAL #3   Title Pt will demonstrate pain-free full range cervical motion in order to perform IADLs such as driving and household chores without increase in symptoms.    Baseline 6/7: see note for limitation  9/1:flexion 54, extension 60, SB R 35 L 30, rotation R 51 L 62 11/15: flexion 58, extension 60, SB R and L 35, R rotation 59 L rotation 60 2/14: flexion 70, extension 60 SB R and L 38, R rotation 59 L 64 4/25: see note 10/31: unable to find tool for ROM assessment, will perform next session.  1/25:look above    Time 12    Period Weeks    Status Partially Met    Target Date 02/11/2023         PT LONG TERM GOAL #4   Title Patient will have less than two days a week of high pain migraines and demonstrate ability to self control/contain migraines.    Baseline 9/1:every day getting the mild ones, 2x/week for high pain migraines. 11/15: two days of high pain in past month, has mini headaches every day 2/14: has worsening headaches in the evening but only 2-3 days of high pain 4/25: 2 days of high pain 10/31: 1-2 days a week 1/25: had 4 last week; but last month was averaging 1 per week.    Time 12    Period Weeks    Status Partially Met    Target Date 02/11/2023         PT LONG TERM GOAL #5   Title Patient will reduce Neck Disability Index score to <10% to demonstrate minimal disability with ADL's including improved sleeping tolerance, sitting tolerance, etc for better mobility at home and work.    Baseline 2/14: 18% 4/25: 16% 10/31: 16% 1/25: 14%   Time 12    Period Weeks    Status Partially Met    Target Date 02/11/2023       PT LONG TERM GOAL #6  Title Patient will tolerate three hours in clinic with fluorescent lights with pain increase of <3/10 for improved quality of life with work   Baseline 10/31: pain worsens with fluorescent lights. 1/25: 2 hours with pain increase  Time 12   Period Weeks   Status Partially met  Target Date 02/11/2023                Plan     Clinical Impression Statement Patient returning to PT after covid related absence. Trigger points in upper traps and suboccipital released. Postural strengthening tolerated well. Patient is highly motivated  for reduction of pain. She will benefit from skilled physical therapy to  reduce pain, improve patient independence with symptom management, and improve quality of life    Personal Factors and Comorbidities Age;Behavior Pattern;Comorbidity 3+;Fitness;Past/Current Experience;Profession;Time since onset of injury/illness/exacerbation    Comorbidities HLD, aortic atherosclerosis, asthma, COVID 19, SOB, hypothyroidism, atypical facial pain (2019), neuropathy (2020), fibromyalgia    Examination-Activity Limitations Caring for Others;Carry;Lift;Sleep;Transfers    Examination-Participation Restrictions Cleaning;Community Activity;Interpersonal Relationship;Driving;Laundry;Shop;Occupation;Meal Prep;Yard Work    [employer] officer Evolving/Moderate complexity    Rehab Potential Fair    PT Frequency Biweekly    PT Duration 12 weeks    PT Treatment/Interventions ADLs/Self Care Home Management;Dry needling;Manual techniques;Therapeutic exercise;Therapeutic activities;Neuromuscular re-education;Aquatic Therapy;Biofeedback;Canalith Repostioning;Cryotherapy;Electrical Stimulation;Iontophoresis 4mg /ml Dexamethasone;Moist Heat;Ultrasound;Patient/family education;Passive range of motion;Energy conservation;Vestibular;Joint Manipulations;Traction;Gait training;Balance training;Taping;Spinal Manipulations;Visual/perceptual remediation/compensation    PT Next Visit Plan exercise technique, body mechanics    Consulted and Agree with Plan of Care Patient              Janna Arch, PT 01/14/2023, 7:47 AM

## 2023-01-14 ENCOUNTER — Ambulatory Visit: Payer: 59 | Attending: Internal Medicine

## 2023-01-14 DIAGNOSIS — R293 Abnormal posture: Secondary | ICD-10-CM | POA: Insufficient documentation

## 2023-01-14 DIAGNOSIS — R519 Headache, unspecified: Secondary | ICD-10-CM | POA: Diagnosis not present

## 2023-01-14 DIAGNOSIS — M542 Cervicalgia: Secondary | ICD-10-CM | POA: Diagnosis not present

## 2023-01-16 ENCOUNTER — Other Ambulatory Visit: Payer: Self-pay | Admitting: Family

## 2023-01-18 NOTE — Telephone Encounter (Signed)
Patient of Dr. Rockey Situ. Last seen in 2022. Please review for refill. Thank you!

## 2023-01-19 ENCOUNTER — Other Ambulatory Visit: Payer: Self-pay

## 2023-01-19 ENCOUNTER — Other Ambulatory Visit (HOSPITAL_COMMUNITY): Payer: Self-pay

## 2023-01-19 ENCOUNTER — Other Ambulatory Visit: Payer: Self-pay | Admitting: Family

## 2023-01-19 MED ORDER — ATORVASTATIN CALCIUM 20 MG PO TABS
20.0000 mg | ORAL_TABLET | Freq: Every day | ORAL | 3 refills | Status: DC
  Filled 2023-01-19: qty 90, 90d supply, fill #0
  Filled 2023-04-12: qty 90, 90d supply, fill #1
  Filled 2023-07-31: qty 90, 90d supply, fill #2
  Filled 2023-10-26: qty 90, 90d supply, fill #3

## 2023-01-22 DIAGNOSIS — G43709 Chronic migraine without aura, not intractable, without status migrainosus: Secondary | ICD-10-CM | POA: Diagnosis not present

## 2023-01-25 ENCOUNTER — Other Ambulatory Visit (HOSPITAL_COMMUNITY): Payer: Self-pay

## 2023-01-26 ENCOUNTER — Ambulatory Visit: Payer: 59 | Attending: Internal Medicine

## 2023-01-26 NOTE — Therapy (Signed)
OUTPATIENT PHYSICAL THERAPY TREATMENT NOTE   Patient Name: Rachel Fry MRN: [id number] DOB:08-02-1956, 67 y.o., female Today's Date: 01/27/2023  PCP: [name] MD  REFERRING PROVIDER: [name] MD    PT End of Session - 01/27/23 1301     Visit Number 39    Number of Visits 42    Date for PT Re-Evaluation 02/11/23    PT Start Time 1300    PT Stop Time 1344    PT Time Calculation (min) 44 min    Activity Tolerance Patient tolerated treatment well    Behavior During Therapy WFL for tasks assessed/performed                          Past Medical History:  Diagnosis Date   Allergy    Asthma    COVID-19    History of kidney stones    Hypothyroidism    Migraines    Numbness and tingling    left side of body, occasionally right side    S/P Botox injection    Urticaria    Past Surgical History:  Procedure Laterality Date   ADENOIDECTOMY     ARM NEUROPLASTY Right 1994 and 1996   for chronic pain   CESAREAN SECTION     CYSTOSCOPY W/ RETROGRADES Left 04/23/2020   Procedure: CYSTOSCOPY WITH RETROGRADE PYELOGRAM;  Surgeon: Abbie Sons, MD;  Location: [facility];  Service: Urology;  Laterality: Left;   CYSTOSCOPY W/ RETROGRADES Left 05/13/2020   Procedure: CYSTOSCOPY WITH RETROGRADE PYELOGRAM;  Surgeon: Hollice Espy, MD;  Location: [facility];  Service: Urology;  Laterality: Left;   CYSTOSCOPY WITH STENT PLACEMENT Left 04/23/2020   Procedure: CYSTOSCOPY WITH STENT PLACEMENT;  Surgeon: Abbie Sons, MD;  Location: [facility];  Service: Urology;  Laterality: Left;   CYSTOSCOPY/URETEROSCOPY/HOLMIUM LASER/STENT PLACEMENT Left 05/13/2020   Procedure: CYSTOSCOPY/URETEROSCOPY/LASER/STENT EXCHANGE;  Surgeon: Hollice Espy, MD;  Location: [facility];  Service: Urology;  Laterality: Left;   OVARIAN CYST REMOVAL  1973   STONE EXTRACTION WITH BASKET  05/13/2020   Procedure: STONE EXTRACTION WITH BASKET;  Surgeon: Hollice Espy, MD;  Location: [facility];   Service: Urology;;   [place]   Patient Active Problem List   Diagnosis Date Noted   Dry eyes 05/12/2022   Asthma 05/12/2022   Fibromyalgia 05/12/2022   Hyperlipidemia 05/12/2022   Osteoporosis 03/13/2021   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 hauler 12/25/2020   Chronic urticaria 12/25/2020   Moderate persistent asthma, uncomplicated 0000000   Ageusia 08/12/2020   Anosmia 08/12/2020   Myalgia, epidemic 08/12/2020   Cough with exposure to COVID-19 virus 08/12/2020   Olfactory impairment 05/29/2020   Acute UTI 04/23/2020   Left ureteral stone 04/23/2020   Intermittent chest pain 02/09/2020   History of COVID-19 02/09/2020   Shortness of breath 02/09/2020   Loss of taste 02/09/2020   Loss of smell 02/09/2020   Rash 02/09/2020   Arthralgia 02/09/2020   Benign essential hypertension 10/25/2019   Gastroesophageal reflux disease without esophagitis 06/23/2019   Chronic migraine 06/23/2019   Neuropathy 11/17/2018   Atypical facial pain 06/30/2018   Hypothyroidism 05/19/2018   Postoperative history of checked last year 04/22/2017   Weakness of left arm 04/22/2017   Numbness and tingling in left arm 04/22/2017   Lower back pain 04/02/2016   Acute stress disorder 02/25/2015   Anxiety 02/25/2015   Airway hyperreactivity 02/25/2015  Bradycardia 02/25/2015   Hypersomnia 02/25/2015   Clinical depression 02/25/2015   Elevated blood sugar 02/25/2015   Fibrositis 02/25/2015   Acid reflux 02/25/2015   Hepatitis non A non B 02/25/2015   H/O disease 02/25/2015   Hypercholesteremia 02/25/2015   Headache, migraine 02/25/2015   Muscle ache 02/25/2015   Allergic rhinitis, seasonal 02/25/2015   Avitaminosis D 02/25/2015    REFERRING DIAG: Migraine  THERAPY DIAG:  Abnormal posture  Cervicalgia  Nonintractable headache, unspecified chronicity pattern, unspecified headache type  Rationale for Evaluation and Treatment  Rehabilitation  PERTINENT HISTORY: Patient is a 67 year old female who presents to physical therapy for migraines with specific referral for dry needling.  Patient has been having migraines for about 14-15 years. They have changed from front temporal all day long 2-3/10, a couple years ago started having tingling in left side of body, numbness in arm and leg. Additionally sometimes get's belly aches.  Changed medication in 2020. Was getting trigger injections but it was too expensive. PMH of HLD, aortic atherosclerosis, asthma, COVID 19, SOB, hypothyroidism, atypical facial pain (2019), neuropathy (2020), fibromyalgia   PRECAUTIONS: n/a  SUBJECTIVE:L eyeball pain today. Is presenting from work today. Is leaving for her trip tomorrow.   PAIN:  Are you having pain? Yes: NPRS scale: 2/10 Pain location: head Pain description: migraine Aggravating factors: stress, tension Relieving factors: heat, rest     TODAY'S TREATMENT:         Right Left  Flexion 82  Extension 55  Side Bending 35 34  Rotation 60 52    Treatment Cervical side bend with overpressure to glenohumeral joint 2x 30 seconds Cervical rotation with overpressure to glenohumeral joint: 2x 30 seconds Suboccipital release 2x30 seconds J mobilization for postural kyphosis reduction 4x 30 seconds cervical and thoracic mobilizations grade II for reduced hypomobility x 6 minutes   Chin tucks 10x 5 second holds Cervical rotation with opposite scapular retraction 10x 5 second holds  Overhead Y 15x RTB RTB ER 15x        Trigger Point Dry Needling (TDN), unbilled Education performed with patient regarding potential benefit of TDN. Reviewed precautions and risks with patient. Reviewed special precautions/risks over lung fields which include pneumothorax. Reviewed signs and symptoms of pneumothorax and advised pt to go to ER immediately if these symptoms develop advise them of dry needling treatment. Extensive time spent with  pt to ensure full understanding of TDN risks. Pt provided verbal consent to treatment. TDN performed to  with 0.30 x 30 and .2 x 0.3 single needle placements with local twitch response (LTR). Pistoning technique utilized. Improved pain-free motion following intervention. L and Rcervical paraspinals, Upper trap (L and R), temporalis, suboccipital musculature  x  15 minutes       PATIENT EDUCATION: Education details: exercise technique, TDN Person educated: Patient Education method: Explanation, Demonstration, Tactile cues, and Verbal cues Education comprehension: verbalized understanding, returned demonstration, verbal cues required, and tactile cues required   HOME EXERCISE PROGRAM: See prior sessions    PT Short Term Goals      PT SHORT TERM GOAL #1   Title Pt will be independent with HEP in order to decrease pain in order to improve pain-free function at home and work.    Baseline 6/7: HEP given 9/1: HEp compliant 11/15: HEP compliant    Time 6    Period Weeks    Status Achieved    Target Date 08/07/21  PT Long Term Goals -      PT LONG TERM GOAL #1   Title Patient will increase FOTO score to equal to or greater than 59%    to demonstrate statistically significant improvement in mobility and quality of life.    Baseline 6/7: 50% 9/1: 59% 11/15: 86% 2/14: 71%    Time 12    Period Weeks    Status Achieved      PT LONG TERM GOAL #2   Title Pt will decrease worst pain as reported on NPRS by at least 3 points (3/10) in order to demonstrate clinically significant reduction in pain.    Baseline 6/7: 6/10 9/1: 4/10 11/15: 5/10 2/14: 3-4/10 4/25: 6/10 10/31: 5/10 1/25: 4-5/10   Time 12    Period Weeks    Status Partially Met    Target Date 02/11/2023         PT LONG TERM GOAL #3   Title Pt will demonstrate pain-free full range cervical motion in order to perform IADLs such as driving and household chores without increase in symptoms.    Baseline 6/7: see  note for limitation 9/1:flexion 54, extension 60, SB R 35 L 30, rotation R 51 L 62 11/15: flexion 58, extension 60, SB R and L 35, R rotation 59 L rotation 60 2/14: flexion 70, extension 60 SB R and L 38, R rotation 59 L 64 4/25: see note 10/31: unable to find tool for ROM assessment, will perform next session.  1/25:look above    Time 12    Period Weeks    Status Partially Met    Target Date 02/11/2023         PT LONG TERM GOAL #4   Title Patient will have less than two days a week of high pain migraines and demonstrate ability to self control/contain migraines.    Baseline 9/1:every day getting the mild ones, 2x/week for high pain migraines. 11/15: two days of high pain in past month, has mini headaches every day 2/14: has worsening headaches in the evening but only 2-3 days of high pain 4/25: 2 days of high pain 10/31: 1-2 days a week 1/25: had 4 last week; but last month was averaging 1 per week.    Time 12    Period Weeks    Status Partially Met    Target Date 02/11/2023         PT LONG TERM GOAL #5   Title Patient will reduce Neck Disability Index score to <10% to demonstrate minimal disability with ADL's including improved sleeping tolerance, sitting tolerance, etc for better mobility at home and work.    Baseline 2/14: 18% 4/25: 16% 10/31: 16% 1/25: 14%   Time 12    Period Weeks    Status Partially Met    Target Date 02/11/2023       PT LONG TERM GOAL #6  Title Patient will tolerate three hours in clinic with fluorescent lights with pain increase of <3/10 for improved quality of life with work   Baseline 10/31: pain worsens with fluorescent lights. 1/25: 2 hours with pain increase  Time 12   Period Weeks   Status Partially met  Target Date 02/11/2023                Plan     Clinical Impression Statement Patient is preparing to leave on her two week trip to see her kids. Has been compliant with HEP but continues to have upper trap tension with  decreased trigger  point but continued tension. Education on use of a neck pillow/positioning for reduction of headaches.  She will benefit from skilled physical therapy to reduce pain, improve patient independence with symptom management, and improve quality of life    Personal Factors and Comorbidities Age;Behavior Pattern;Comorbidity 3+;Fitness;Past/Current Experience;Profession;Time since onset of injury/illness/exacerbation    Comorbidities HLD, aortic atherosclerosis, asthma, COVID 19, SOB, hypothyroidism, atypical facial pain (2019), neuropathy (2020), fibromyalgia    Examination-Activity Limitations Caring for Others;Carry;Lift;Sleep;Transfers    Examination-Participation Restrictions Cleaning;Community Activity;Interpersonal Relationship;Driving;Laundry;Shop;Occupation;Meal Prep;Yard Work    [employer] officer Evolving/Moderate complexity    Rehab Potential Fair    PT Frequency Biweekly    PT Duration 12 weeks    PT Treatment/Interventions ADLs/Self Care Home Management;Dry needling;Manual techniques;Therapeutic exercise;Therapeutic activities;Neuromuscular re-education;Aquatic Therapy;Biofeedback;Canalith Repostioning;Cryotherapy;Electrical Stimulation;Iontophoresis 4mg /ml Dexamethasone;Moist Heat;Ultrasound;Patient/family education;Passive range of motion;Energy conservation;Vestibular;Joint Manipulations;Traction;Gait training;Balance training;Taping;Spinal Manipulations;Visual/perceptual remediation/compensation    PT Next Visit Plan exercise technique, body mechanics    Consulted and Agree with Plan of Care Patient              Janna Arch, PT 01/27/2023, 1:44 PM

## 2023-01-27 ENCOUNTER — Ambulatory Visit: Payer: 59 | Attending: Internal Medicine

## 2023-01-27 DIAGNOSIS — M542 Cervicalgia: Secondary | ICD-10-CM | POA: Insufficient documentation

## 2023-01-27 DIAGNOSIS — R519 Headache, unspecified: Secondary | ICD-10-CM | POA: Diagnosis not present

## 2023-01-27 DIAGNOSIS — R293 Abnormal posture: Secondary | ICD-10-CM | POA: Insufficient documentation

## 2023-02-08 ENCOUNTER — Other Ambulatory Visit (HOSPITAL_COMMUNITY): Payer: Self-pay

## 2023-02-16 ENCOUNTER — Other Ambulatory Visit
Admission: RE | Admit: 2023-02-16 | Discharge: 2023-02-16 | Disposition: A | Discharge status: Home / Self Care | Payer: 59 | Source: Home / Self Care | Attending: Internal Medicine | Admitting: Internal Medicine

## 2023-02-16 ENCOUNTER — Other Ambulatory Visit (HOSPITAL_COMMUNITY): Payer: Self-pay

## 2023-02-16 ENCOUNTER — Other Ambulatory Visit: Payer: Self-pay | Admitting: Internal Medicine

## 2023-02-16 ENCOUNTER — Other Ambulatory Visit: Payer: Self-pay

## 2023-02-16 ENCOUNTER — Ambulatory Visit: Payer: 59

## 2023-02-16 ENCOUNTER — Ambulatory Visit
Admission: RE | Admit: 2023-02-16 | Discharge: 2023-02-16 | Disposition: A | Discharge status: Home / Self Care | Payer: 59 | Source: Ambulatory Visit | Attending: Internal Medicine | Admitting: Internal Medicine

## 2023-02-16 DIAGNOSIS — J189 Pneumonia, unspecified organism: Secondary | ICD-10-CM | POA: Diagnosis not present

## 2023-02-17 ENCOUNTER — Other Ambulatory Visit (HOSPITAL_COMMUNITY): Payer: Self-pay

## 2023-02-17 ENCOUNTER — Other Ambulatory Visit: Payer: Self-pay

## 2023-02-19 ENCOUNTER — Other Ambulatory Visit: Payer: Self-pay

## 2023-02-19 ENCOUNTER — Other Ambulatory Visit (HOSPITAL_COMMUNITY): Payer: Self-pay

## 2023-02-19 MED ORDER — PREDNISONE 10 MG (21) PO TBPK
ORAL_TABLET | ORAL | 0 refills | Status: DC
  Filled 2023-02-19: qty 21, 6d supply, fill #0

## 2023-02-19 MED ORDER — PREDNISONE 10 MG (21) PO TBPK: ORAL_TABLET | ORAL | 0 refills | Status: DC

## 2023-02-25 ENCOUNTER — Other Ambulatory Visit (HOSPITAL_COMMUNITY): Payer: Self-pay

## 2023-03-04 ENCOUNTER — Ambulatory Visit: Payer: 59 | Attending: Internal Medicine

## 2023-03-04 DIAGNOSIS — R519 Headache, unspecified: Secondary | ICD-10-CM | POA: Diagnosis not present

## 2023-03-04 DIAGNOSIS — G8929 Other chronic pain: Secondary | ICD-10-CM

## 2023-03-04 DIAGNOSIS — M25511 Pain in right shoulder: Secondary | ICD-10-CM | POA: Insufficient documentation

## 2023-03-04 DIAGNOSIS — M542 Cervicalgia: Secondary | ICD-10-CM | POA: Diagnosis not present

## 2023-03-04 DIAGNOSIS — R293 Abnormal posture: Secondary | ICD-10-CM

## 2023-03-04 NOTE — Therapy (Signed)
OUTPATIENT PHYSICAL THERAPY TREATMENT NOTE/RECERT/ Physical Therapy Progress Note   Dates of reporting period  08/26/23   to   03/04/23    Patient Name: Rachel Fry MRN: [id number] DOB:04-10-1956, 67 y.o., female Today's Date: 03/04/2023  PCP: Andi Devon MD  REFERRING PROVIDER: Andi Devon MD    PT End of Session - 03/04/23 0711     Visit Number 40    Number of Visits 46    Date for PT Re-Evaluation 05/27/23    PT Start Time 0715    PT Stop Time 0759    PT Time Calculation (min) 44 min    Activity Tolerance Patient tolerated treatment well    Behavior During Therapy WFL for tasks assessed/performed                          Past Medical History:  Diagnosis Date   Allergy    Asthma    COVID-19    History of kidney stones    Hypothyroidism    Migraines    Numbness and tingling    left side of body, occasionally right side    S/P Botox injection    Urticaria    Past Surgical History:  Procedure Laterality Date   ADENOIDECTOMY     ARM NEUROPLASTY Right 1994 and 1996   for chronic pain   CESAREAN SECTION     CYSTOSCOPY W/ RETROGRADES Left 04/23/2020   Procedure: CYSTOSCOPY WITH RETROGRADE PYELOGRAM;  Surgeon: Riki Altes, MD;  Location: [facility];  Service: Urology;  Laterality: Left;   CYSTOSCOPY W/ RETROGRADES Left 05/13/2020   Procedure: CYSTOSCOPY WITH RETROGRADE PYELOGRAM;  Surgeon: Vanna Scotland, MD;  Location: [facility];  Service: Urology;  Laterality: Left;   CYSTOSCOPY WITH STENT PLACEMENT Left 04/23/2020   Procedure: CYSTOSCOPY WITH STENT PLACEMENT;  Surgeon: Riki Altes, MD;  Location: [facility];  Service: Urology;  Laterality: Left;   CYSTOSCOPY/URETEROSCOPY/HOLMIUM LASER/STENT PLACEMENT Left 05/13/2020   Procedure: CYSTOSCOPY/URETEROSCOPY/LASER/STENT EXCHANGE;  Surgeon: Vanna Scotland, MD;  Location: [facility];  Service: Urology;  Laterality: Left;   OVARIAN CYST REMOVAL  1973   STONE EXTRACTION WITH BASKET  05/13/2020    Procedure: STONE EXTRACTION WITH BASKET;  Surgeon: Vanna Scotland, MD;  Location: [facility];  Service: Urology;;   TONSILLECTOMY     TONSILLECTOMY AND ADENOIDECTOMY     TUBAL LIGATION  1988   Patient Active Problem List   Diagnosis Date Noted   Dry eyes 05/12/2022   Asthma 05/12/2022   Fibromyalgia 05/12/2022   Hyperlipidemia 05/12/2022   Osteoporosis 03/13/2021   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 hauler 12/25/2020   Chronic urticaria 12/25/2020   Moderate persistent asthma, uncomplicated 12/25/2020   Ageusia 08/12/2020   Anosmia 08/12/2020   Myalgia, epidemic 08/12/2020   Cough with exposure to COVID-19 virus 08/12/2020   Olfactory impairment 05/29/2020   Acute UTI 04/23/2020   Left ureteral stone 04/23/2020   Intermittent chest pain 02/09/2020   History of COVID-19 02/09/2020   Shortness of breath 02/09/2020   Loss of taste 02/09/2020   Loss of smell 02/09/2020   Rash 02/09/2020   Arthralgia 02/09/2020   Benign essential hypertension 10/25/2019   Gastroesophageal reflux disease without esophagitis 06/23/2019   Chronic migraine 06/23/2019   Neuropathy 11/17/2018   Atypical facial pain 06/30/2018   Hypothyroidism 05/19/2018   Postoperative history of checked last year 04/22/2017   Weakness of left arm 04/22/2017   Numbness and tingling in left arm 04/22/2017   Lower back  pain 04/02/2016   Acute stress disorder 02/25/2015   Anxiety 02/25/2015   Airway hyperreactivity 02/25/2015   Bradycardia 02/25/2015   Hypersomnia 02/25/2015   Clinical depression 02/25/2015   Elevated blood sugar 02/25/2015   Fibrositis 02/25/2015   Acid reflux 02/25/2015   Hepatitis non A non B 02/25/2015   H/O disease 02/25/2015   Hypercholesteremia 02/25/2015   Headache, migraine 02/25/2015   Muscle ache 02/25/2015   Allergic rhinitis, seasonal 02/25/2015   Avitaminosis D 02/25/2015    REFERRING DIAG: Migraine  THERAPY DIAG:  Abnormal posture  Cervicalgia  Nonintractable headache, unspecified  chronicity pattern, unspecified headache type  Chronic right shoulder pain  Rationale for Evaluation and Treatment Rehabilitation  PERTINENT HISTORY: Patient is a 67 year old female who presents to physical therapy for migraines with specific referral for dry needling.  Patient has been having migraines for about 14-15 years. They have changed from front temporal all day long 2-3/10, a couple years ago started having tingling in left side of body, numbness in arm and leg. Additionally sometimes get's belly aches.  Changed medication in 2020. Was getting trigger injections but it was too expensive. PMH of HLD, aortic atherosclerosis, asthma, COVID 19, SOB, hypothyroidism, atypical facial pain (2019), neuropathy (2020), fibromyalgia   PRECAUTIONS: n/a  SUBJECTIVE Patient is returning from trip and illness, has been absent >1 month.   PAIN:  Are you having pain? Yes: NPRS scale: 2/10 Pain location: head Pain description: migraine Aggravating factors: stress, tension Relieving factors: heat, rest     TODAY'S TREATMENT:      Goals: see below    Right Left  Flexion 82  Extension 55  Side Bending 35 34  Rotation 60 52    Treatment Cervical side bend with overpressure to glenohumeral joint 2x 30 seconds Cervical rotation with overpressure to glenohumeral joint: 2x 30 seconds Suboccipital release 2x30 seconds J mobilization for postural kyphosis reduction 4x 30 seconds cervical and thoracic mobilizations grade II for reduced hypomobility x 6 minutes          Trigger Point Dry Needling (TDN), unbilled Education performed with patient regarding potential benefit of TDN. Reviewed precautions and risks with patient. Reviewed special precautions/risks over lung fields which include pneumothorax. Reviewed signs and symptoms of pneumothorax and advised pt to go to ER immediately if these symptoms develop advise them of dry needling treatment. Extensive time spent with pt to ensure full  understanding of TDN risks. Pt provided verbal consent to treatment. TDN performed to  with 0.30 x 30 and .2 x 0.3 single needle placements with local twitch response (LTR). Pistoning technique utilized. Improved pain-free motion following intervention. L and Rcervical paraspinals, Upper trap (L and R), temporalis, suboccipital musculature  x  15 minutes       PATIENT EDUCATION: Education details: exercise technique, TDN Person educated: Patient Education method: Explanation, Demonstration, Tactile cues, and Verbal cues Education comprehension: verbalized understanding, returned demonstration, verbal cues required, and tactile cues required   HOME EXERCISE PROGRAM: See prior sessions    PT Short Term Goals      PT SHORT TERM GOAL #1   Title Pt will be independent with HEP in order to decrease pain in order to improve pain-free function at home and work.    Baseline 6/7: HEP given 9/1: HEp compliant 11/15: HEP compliant    Time 6    Period Weeks    Status Achieved    Target Date 08/07/21  PT Long Term Goals -      PT LONG TERM GOAL #1   Title Patient will increase FOTO score to equal to or greater than 59%    to demonstrate statistically significant improvement in mobility and quality of life.    Baseline 6/7: 50% 9/1: 59% 11/15: 86% 2/14: 71%    Time 12    Period Weeks    Status Achieved      PT LONG TERM GOAL #2   Title Pt will decrease worst pain as reported on NPRS by at least 3 points (3/10) in order to demonstrate clinically significant reduction in pain.    Baseline 6/7: 6/10 9/1: 4/10 11/15: 5/10 2/14: 3-4/10 4/25: 6/10 10/31: 5/10 1/25: 4-5/10 5/9: 5/10 from illness but improving   Time 12    Period Weeks    Status Partially Met    Target Date 05/27/2023           PT LONG TERM GOAL #3   Title Pt will demonstrate pain-free full range cervical motion in order to perform IADLs such as driving and household chores without increase in symptoms.     Baseline 6/7: see note for limitation 9/1:flexion 54, extension 60, SB R 35 L 30, rotation R 51 L 62 11/15: flexion 58, extension 60, SB R and L 35, R rotation 59 L rotation 60 2/14: flexion 70, extension 60 SB R and L 38, R rotation 59 L 64 4/25: see note 10/31: unable to find tool for ROM assessment, will perform next session.  1/25:look above    Time 12    Period Weeks    Status Partially Met    Target Date 05/27/2023           PT LONG TERM GOAL #4   Title Patient will have less than two days a week of high pain migraines and demonstrate ability to self control/contain migraines.    Baseline 9/1:every day getting the mild ones, 2x/week for high pain migraines. 11/15: two days of high pain in past month, has mini headaches every day 2/14: has worsening headaches in the evening but only 2-3 days of high pain 4/25: 2 days of high pain 10/31: 1-2 days a week 1/25: had 4 last week; but last month was averaging 1 per week. 5/9: 2 migraines in 3 weeks ; multiple weeks with no migraine.    Time 12    Period Weeks    Status Partially Met    Target Date 05/27/2023           PT LONG TERM GOAL #5   Title Patient will reduce Neck Disability Index score to <10% to demonstrate minimal disability with ADL's including improved sleeping tolerance, sitting tolerance, etc for better mobility at home and work.    Baseline 2/14: 18% 4/25: 16% 10/31: 16% 1/25: 14% 5/9: 13%   Time 12    Period Weeks    Status Partially Met    Target Date 02/11/2023       PT LONG TERM GOAL #6  Title Patient will tolerate three hours in clinic with fluorescent lights with pain increase of <3/10 for improved quality of life with work   Baseline 10/31: pain worsens with fluorescent lights. 1/25: 2 hours with pain increase 5/9; tolerates 3 hours   Time 12   Period Weeks   Status MET  Target Date 03/04/23            PT LONG TERM GOAL #7  Title Patient will  improve the Headache Disability Index  to <10% or  decreased disability and improved quality of life.   Baseline 5/9: 28%   Time 12   Period Weeks   Status NEW  Target Date 05/27/2023              Plan     Clinical Impression Statement Patient returning after trip and pneumonia. Her migraines in general have significantly improved with less frequent migraines. Averaging 1 per every 2 weeks indicating therapy is improving symptoms. New goal for headache disability index added to POC as headaches are still happening monthly.  Patient's condition has the potential to improve in response to therapy. Maximum improvement is yet to be obtained. The anticipated improvement is attainable and reasonable in a generally predictable time.   She will benefit from skilled physical therapy to reduce pain, improve patient independence with symptom management, and improve quality of life    Personal Factors and Comorbidities Age;Behavior Pattern;Comorbidity 3+;Fitness;Past/Current Experience;Profession;Time since onset of injury/illness/exacerbation    Comorbidities HLD, aortic atherosclerosis, asthma, COVID 19, SOB, hypothyroidism, atypical facial pain (2019), neuropathy (2020), fibromyalgia    Examination-Activity Limitations Caring for Others;Carry;Lift;Sleep;Transfers    Examination-Participation Restrictions Cleaning;Community Activity;Interpersonal Relationship;Driving;Laundry;Shop;Occupation;Meal Prep;Yard Work    Conservation officer, historic buildings Evolving/Moderate complexity    Rehab Potential Fair    PT Frequency Biweekly    PT Duration 12 weeks    PT Treatment/Interventions ADLs/Self Care Home Management;Dry needling;Manual techniques;Therapeutic exercise;Therapeutic activities;Neuromuscular re-education;Aquatic Therapy;Biofeedback;Canalith Repostioning;Cryotherapy;Electrical Stimulation;Iontophoresis 4mg /ml Dexamethasone;Moist Heat;Ultrasound;Patient/family education;Passive range of motion;Energy conservation;Vestibular;Joint  Manipulations;Traction;Gait training;Balance training;Taping;Spinal Manipulations;Visual/perceptual remediation/compensation    PT Next Visit Plan exercise technique, body mechanics    Consulted and Agree with Plan of Care Patient              Precious Bard, PT 03/04/2023, 7:57 AM

## 2023-03-05 ENCOUNTER — Other Ambulatory Visit: Payer: Self-pay

## 2023-03-05 ENCOUNTER — Other Ambulatory Visit (HOSPITAL_COMMUNITY): Payer: Self-pay

## 2023-03-10 ENCOUNTER — Other Ambulatory Visit (HOSPITAL_BASED_OUTPATIENT_CLINIC_OR_DEPARTMENT_OTHER): Payer: Self-pay

## 2023-03-13 ENCOUNTER — Other Ambulatory Visit (HOSPITAL_COMMUNITY): Payer: Self-pay

## 2023-03-15 ENCOUNTER — Other Ambulatory Visit (HOSPITAL_COMMUNITY): Payer: Self-pay

## 2023-03-15 ENCOUNTER — Other Ambulatory Visit: Payer: Self-pay

## 2023-03-16 ENCOUNTER — Other Ambulatory Visit (HOSPITAL_COMMUNITY): Payer: Self-pay

## 2023-03-18 ENCOUNTER — Ambulatory Visit: Payer: 59

## 2023-03-18 DIAGNOSIS — M542 Cervicalgia: Secondary | ICD-10-CM | POA: Diagnosis not present

## 2023-03-18 DIAGNOSIS — R519 Headache, unspecified: Secondary | ICD-10-CM

## 2023-03-18 DIAGNOSIS — G8929 Other chronic pain: Secondary | ICD-10-CM | POA: Diagnosis not present

## 2023-03-18 DIAGNOSIS — R293 Abnormal posture: Secondary | ICD-10-CM | POA: Diagnosis not present

## 2023-03-18 DIAGNOSIS — M25511 Pain in right shoulder: Secondary | ICD-10-CM | POA: Diagnosis not present

## 2023-03-18 NOTE — Therapy (Signed)
OUTPATIENT PHYSICAL THERAPY TREATMENT NOTE  Patient Name: Rachel Fry MRN: [id number] DOB:Nov 20, 1955, 67 y.o., female Today's Date: 03/18/2023  PCP: Andi Devon MD  REFERRING PROVIDER: Andi Devon MD    PT End of Session - 03/18/23 1516     Visit Number 41    Number of Visits 46    Date for PT Re-Evaluation 05/27/23    PT Start Time 1515    PT Stop Time 1559    PT Time Calculation (min) 44 min    Activity Tolerance Patient tolerated treatment well    Behavior During Therapy WFL for tasks assessed/performed                           Past Medical History:  Diagnosis Date   Allergy    Asthma    COVID-19    History of kidney stones    Hypothyroidism    Migraines    Numbness and tingling    left side of body, occasionally right side    S/P Botox injection    Urticaria    Past Surgical History:  Procedure Laterality Date   ADENOIDECTOMY     ARM NEUROPLASTY Right 1994 and 1996   for chronic pain   CESAREAN SECTION     CYSTOSCOPY W/ RETROGRADES Left 04/23/2020   Procedure: CYSTOSCOPY WITH RETROGRADE PYELOGRAM;  Surgeon: Riki Altes, MD;  Location: [facility];  Service: Urology;  Laterality: Left;   CYSTOSCOPY W/ RETROGRADES Left 05/13/2020   Procedure: CYSTOSCOPY WITH RETROGRADE PYELOGRAM;  Surgeon: Vanna Scotland, MD;  Location: [facility];  Service: Urology;  Laterality: Left;   CYSTOSCOPY WITH STENT PLACEMENT Left 04/23/2020   Procedure: CYSTOSCOPY WITH STENT PLACEMENT;  Surgeon: Riki Altes, MD;  Location: [facility];  Service: Urology;  Laterality: Left;   CYSTOSCOPY/URETEROSCOPY/HOLMIUM LASER/STENT PLACEMENT Left 05/13/2020   Procedure: CYSTOSCOPY/URETEROSCOPY/LASER/STENT EXCHANGE;  Surgeon: Vanna Scotland, MD;  Location: [facility];  Service: Urology;  Laterality: Left;   OVARIAN CYST REMOVAL  1973   STONE EXTRACTION WITH BASKET  05/13/2020   Procedure: STONE EXTRACTION WITH BASKET;  Surgeon: Vanna Scotland, MD;  Location: [facility];   Service: Urology;;   TONSILLECTOMY     TONSILLECTOMY AND ADENOIDECTOMY     TUBAL LIGATION  1988   Patient Active Problem List   Diagnosis Date Noted   Dry eyes 05/12/2022   Asthma 05/12/2022   Fibromyalgia 05/12/2022   Hyperlipidemia 05/12/2022   Osteoporosis 03/13/2021   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 hauler 12/25/2020   Chronic urticaria 12/25/2020   Moderate persistent asthma, uncomplicated 12/25/2020   Ageusia 08/12/2020   Anosmia 08/12/2020   Myalgia, epidemic 08/12/2020   Cough with exposure to COVID-19 virus 08/12/2020   Olfactory impairment 05/29/2020   Acute UTI 04/23/2020   Left ureteral stone 04/23/2020   Intermittent chest pain 02/09/2020   History of COVID-19 02/09/2020   Shortness of breath 02/09/2020   Loss of taste 02/09/2020   Loss of smell 02/09/2020   Rash 02/09/2020   Arthralgia 02/09/2020   Benign essential hypertension 10/25/2019   Gastroesophageal reflux disease without esophagitis 06/23/2019   Chronic migraine 06/23/2019   Neuropathy 11/17/2018   Atypical facial pain 06/30/2018   Hypothyroidism 05/19/2018   Postoperative history of checked last year 04/22/2017   Weakness of left arm 04/22/2017   Numbness and tingling in left arm 04/22/2017   Lower back pain 04/02/2016   Acute stress disorder 02/25/2015   Anxiety 02/25/2015   Airway hyperreactivity 02/25/2015  Bradycardia 02/25/2015   Hypersomnia 02/25/2015   Clinical depression 02/25/2015   Elevated blood sugar 02/25/2015   Fibrositis 02/25/2015   Acid reflux 02/25/2015   Hepatitis non A non B 02/25/2015   H/O disease 02/25/2015   Hypercholesteremia 02/25/2015   Headache, migraine 02/25/2015   Muscle ache 02/25/2015   Allergic rhinitis, seasonal 02/25/2015   Avitaminosis D 02/25/2015    REFERRING DIAG: Migraine  THERAPY DIAG:  Abnormal posture  Cervicalgia  Nonintractable headache, unspecified chronicity pattern, unspecified headache type  Rationale for Evaluation and Treatment  Rehabilitation  PERTINENT HISTORY: Patient is a 67 year old female who presents to physical therapy for migraines with specific referral for dry needling.  Patient has been having migraines for about 14-15 years. They have changed from front temporal all day long 2-3/10, a couple years ago started having tingling in left side of body, numbness in arm and leg. Additionally sometimes get's belly aches.  Changed medication in 2020. Was getting trigger injections but it was too expensive. PMH of HLD, aortic atherosclerosis, asthma, COVID 19, SOB, hypothyroidism, atypical facial pain (2019), neuropathy (2020), fibromyalgia   PRECAUTIONS: n/a  SUBJECTIVE Patient missed this morning due to sleeping in. Having work stress.   PAIN:  Are you having pain? Yes: NPRS scale: 2/10 Pain location: head Pain description: migraine Aggravating factors: stress, tension Relieving factors: heat, rest     TODAY'S TREATMENT:        Right Left  Flexion 82  Extension 55  Side Bending 35 34  Rotation 60 52    Treatment Cervical side bend with overpressure to glenohumeral joint 2x 30 seconds Cervical rotation with overpressure to glenohumeral joint: 2x 30 seconds Suboccipital release 2x30 seconds J mobilization for postural kyphosis reduction 4x 30 seconds cervical and thoracic mobilizations grade II for reduced hypomobility x 6 minutes   TherEx: RTB overhead Y 15x Scapular retraction with cervical rotation 10x each side Towel distraction with cervical extension 10x      Trigger Point Dry Needling (TDN), unbilled Education performed with patient regarding potential benefit of TDN. Reviewed precautions and risks with patient. Reviewed special precautions/risks over lung fields which include pneumothorax. Reviewed signs and symptoms of pneumothorax and advised pt to go to ER immediately if these symptoms develop advise them of dry needling treatment. Extensive time spent with pt to ensure full  understanding of TDN risks. Pt provided verbal consent to treatment. TDN performed to  with 0.30 x 30 and .2 x 0.3 single needle placements with local twitch response (LTR). Pistoning technique utilized. Improved pain-free motion following intervention. L and Rcervical paraspinals, Upper trap (L and R), temporalis, suboccipital musculature  x  15 minutes       PATIENT EDUCATION: Education details: exercise technique, TDN Person educated: Patient Education method: Explanation, Demonstration, Tactile cues, and Verbal cues Education comprehension: verbalized understanding, returned demonstration, verbal cues required, and tactile cues required   HOME EXERCISE PROGRAM: See prior sessions    PT Short Term Goals      PT SHORT TERM GOAL #1   Title Pt will be independent with HEP in order to decrease pain in order to improve pain-free function at home and work.    Baseline 6/7: HEP given 9/1: HEp compliant 11/15: HEP compliant    Time 6    Period Weeks    Status Achieved    Target Date 08/07/21              PT Long Term Goals -  PT LONG TERM GOAL #1   Title Patient will increase FOTO score to equal to or greater than 59%    to demonstrate statistically significant improvement in mobility and quality of life.    Baseline 6/7: 50% 9/1: 59% 11/15: 86% 2/14: 71%    Time 12    Period Weeks    Status Achieved      PT LONG TERM GOAL #2   Title Pt will decrease worst pain as reported on NPRS by at least 3 points (3/10) in order to demonstrate clinically significant reduction in pain.    Baseline 6/7: 6/10 9/1: 4/10 11/15: 5/10 2/14: 3-4/10 4/25: 6/10 10/31: 5/10 1/25: 4-5/10 5/9: 5/10 from illness but improving   Time 12    Period Weeks    Status Partially Met    Target Date 05/27/2023           PT LONG TERM GOAL #3   Title Pt will demonstrate pain-free full range cervical motion in order to perform IADLs such as driving and household chores without increase in symptoms.     Baseline 6/7: see note for limitation 9/1:flexion 54, extension 60, SB R 35 L 30, rotation R 51 L 62 11/15: flexion 58, extension 60, SB R and L 35, R rotation 59 L rotation 60 2/14: flexion 70, extension 60 SB R and L 38, R rotation 59 L 64 4/25: see note 10/31: unable to find tool for ROM assessment, will perform next session.  1/25:look above    Time 12    Period Weeks    Status Partially Met    Target Date 05/27/2023           PT LONG TERM GOAL #4   Title Patient will have less than two days a week of high pain migraines and demonstrate ability to self control/contain migraines.    Baseline 9/1:every day getting the mild ones, 2x/week for high pain migraines. 11/15: two days of high pain in past month, has mini headaches every day 2/14: has worsening headaches in the evening but only 2-3 days of high pain 4/25: 2 days of high pain 10/31: 1-2 days a week 1/25: had 4 last week; but last month was averaging 1 per week. 5/9: 2 migraines in 3 weeks ; multiple weeks with no migraine.    Time 12    Period Weeks    Status Partially Met    Target Date 05/27/2023           PT LONG TERM GOAL #5   Title Patient will reduce Neck Disability Index score to <10% to demonstrate minimal disability with ADL's including improved sleeping tolerance, sitting tolerance, etc for better mobility at home and work.    Baseline 2/14: 18% 4/25: 16% 10/31: 16% 1/25: 14% 5/9: 13%   Time 12    Period Weeks    Status Partially Met    Target Date 02/11/2023       PT LONG TERM GOAL #6  Title Patient will tolerate three hours in clinic with fluorescent lights with pain increase of <3/10 for improved quality of life with work   Baseline 10/31: pain worsens with fluorescent lights. 1/25: 2 hours with pain increase 5/9; tolerates 3 hours   Time 12   Period Weeks   Status MET  Target Date 03/18/23            PT LONG TERM GOAL #7  Title Patient will improve the Headache Disability Index  to <10% or  decreased disability and improved quality of life.   Baseline 5/9: 28%   Time 12   Period Weeks   Status NEW  Target Date 05/27/2023              Plan     Clinical Impression Statement Patient tolerates TDN well with intermittent trigger points release in upper traps. She is able to tolerate all interventions with no pain increase. Postural strengthening performed with good ROM and no fatigue. Rotation is improved by end of session.   She will benefit from skilled physical therapy to reduce pain, improve patient independence with symptom management, and improve quality of life    Personal Factors and Comorbidities Age;Behavior Pattern;Comorbidity 3+;Fitness;Past/Current Experience;Profession;Time since onset of injury/illness/exacerbation    Comorbidities HLD, aortic atherosclerosis, asthma, COVID 19, SOB, hypothyroidism, atypical facial pain (2019), neuropathy (2020), fibromyalgia    Examination-Activity Limitations Caring for Others;Carry;Lift;Sleep;Transfers    Examination-Participation Restrictions Cleaning;Community Activity;Interpersonal Relationship;Driving;Laundry;Shop;Occupation;Meal Prep;Yard Work    Conservation officer, historic buildings Evolving/Moderate complexity    Rehab Potential Fair    PT Frequency Biweekly    PT Duration 12 weeks    PT Treatment/Interventions ADLs/Self Care Home Management;Dry needling;Manual techniques;Therapeutic exercise;Therapeutic activities;Neuromuscular re-education;Aquatic Therapy;Biofeedback;Canalith Repostioning;Cryotherapy;Electrical Stimulation;Iontophoresis 4mg /ml Dexamethasone;Moist Heat;Ultrasound;Patient/family education;Passive range of motion;Energy conservation;Vestibular;Joint Manipulations;Traction;Gait training;Balance training;Taping;Spinal Manipulations;Visual/perceptual remediation/compensation    PT Next Visit Plan exercise technique, body mechanics    Consulted and Agree with Plan of Care Patient              Precious Bard, PT 03/18/2023, 4:01 PM

## 2023-03-23 ENCOUNTER — Other Ambulatory Visit (HOSPITAL_COMMUNITY): Payer: Self-pay

## 2023-03-23 DIAGNOSIS — H43811 Vitreous degeneration, right eye: Secondary | ICD-10-CM | POA: Diagnosis not present

## 2023-03-30 NOTE — Therapy (Signed)
OUTPATIENT PHYSICAL THERAPY TREATMENT NOTE  Patient Name: Rachel Fry MRN: [id number] DOB:1956-03-08, 67 y.o., female Today's Date: 03/31/2023  PCP: Andi Devon MD  REFERRING PROVIDER: Andi Devon MD    PT End of Session - 03/31/23 1601     Visit Number 42    Number of Visits 46    Date for PT Re-Evaluation 05/27/23    PT Start Time 1516    PT Stop Time 1557    PT Time Calculation (min) 41 min    Activity Tolerance Patient tolerated treatment well    Behavior During Therapy WFL for tasks assessed/performed                            Past Medical History:  Diagnosis Date   Allergy    Asthma    COVID-19    History of kidney stones    Hypothyroidism    Migraines    Numbness and tingling    left side of body, occasionally right side    S/P Botox injection    Urticaria    Past Surgical History:  Procedure Laterality Date   ADENOIDECTOMY     ARM NEUROPLASTY Right 1994 and 1996   for chronic pain   CESAREAN SECTION     CYSTOSCOPY W/ RETROGRADES Left 04/23/2020   Procedure: CYSTOSCOPY WITH RETROGRADE PYELOGRAM;  Surgeon: Riki Altes, MD;  Location: [facility];  Service: Urology;  Laterality: Left;   CYSTOSCOPY W/ RETROGRADES Left 05/13/2020   Procedure: CYSTOSCOPY WITH RETROGRADE PYELOGRAM;  Surgeon: Vanna Scotland, MD;  Location: [facility];  Service: Urology;  Laterality: Left;   CYSTOSCOPY WITH STENT PLACEMENT Left 04/23/2020   Procedure: CYSTOSCOPY WITH STENT PLACEMENT;  Surgeon: Riki Altes, MD;  Location: [facility];  Service: Urology;  Laterality: Left;   CYSTOSCOPY/URETEROSCOPY/HOLMIUM LASER/STENT PLACEMENT Left 05/13/2020   Procedure: CYSTOSCOPY/URETEROSCOPY/LASER/STENT EXCHANGE;  Surgeon: Vanna Scotland, MD;  Location: [facility];  Service: Urology;  Laterality: Left;   OVARIAN CYST REMOVAL  1973   STONE EXTRACTION WITH BASKET  05/13/2020   Procedure: STONE EXTRACTION WITH BASKET;  Surgeon: Vanna Scotland, MD;  Location: [facility];   Service: Urology;;   TONSILLECTOMY     TONSILLECTOMY AND ADENOIDECTOMY     TUBAL LIGATION  1988   Patient Active Problem List   Diagnosis Date Noted   Dry eyes 05/12/2022   Asthma 05/12/2022   Fibromyalgia 05/12/2022   Hyperlipidemia 05/12/2022   Osteoporosis 03/13/2021   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 hauler 12/25/2020   Chronic urticaria 12/25/2020   Moderate persistent asthma, uncomplicated 12/25/2020   Ageusia 08/12/2020   Anosmia 08/12/2020   Myalgia, epidemic 08/12/2020   Cough with exposure to COVID-19 virus 08/12/2020   Olfactory impairment 05/29/2020   Acute UTI 04/23/2020   Left ureteral stone 04/23/2020   Intermittent chest pain 02/09/2020   History of COVID-19 02/09/2020   Shortness of breath 02/09/2020   Loss of taste 02/09/2020   Loss of smell 02/09/2020   Rash 02/09/2020   Arthralgia 02/09/2020   Benign essential hypertension 10/25/2019   Gastroesophageal reflux disease without esophagitis 06/23/2019   Chronic migraine 06/23/2019   Neuropathy 11/17/2018   Atypical facial pain 06/30/2018   Hypothyroidism 05/19/2018   Postoperative history of checked last year 04/22/2017   Weakness of left arm 04/22/2017   Numbness and tingling in left arm 04/22/2017   Lower back pain 04/02/2016   Acute stress disorder 02/25/2015   Anxiety 02/25/2015   Airway hyperreactivity 02/25/2015  Bradycardia 02/25/2015   Hypersomnia 02/25/2015   Clinical depression 02/25/2015   Elevated blood sugar 02/25/2015   Fibrositis 02/25/2015   Acid reflux 02/25/2015   Hepatitis non A non B 02/25/2015   H/O disease 02/25/2015   Hypercholesteremia 02/25/2015   Headache, migraine 02/25/2015   Muscle ache 02/25/2015   Allergic rhinitis, seasonal 02/25/2015   Avitaminosis D 02/25/2015    REFERRING DIAG: Migraine  THERAPY DIAG:  Abnormal posture  Cervicalgia  Nonintractable headache, unspecified chronicity pattern, unspecified headache type  Rationale for Evaluation and Treatment  Rehabilitation  PERTINENT HISTORY: Patient is a 67 year old female who presents to physical therapy for migraines with specific referral for dry needling.  Patient has been having migraines for about 14-15 years. They have changed from front temporal all day long 2-3/10, a couple years ago started having tingling in left side of body, numbness in arm and leg. Additionally sometimes get's belly aches.  Changed medication in 2020. Was getting trigger injections but it was too expensive. PMH of HLD, aortic atherosclerosis, asthma, COVID 19, SOB, hypothyroidism, atypical facial pain (2019), neuropathy (2020), fibromyalgia   PRECAUTIONS: n/a  SUBJECTIVEPatient has had increased tension from work.    PAIN:  Are you having pain? Yes: NPRS scale: 2/10 Pain location: head Pain description: migraine Aggravating factors: stress, tension Relieving factors: heat, rest     TODAY'S TREATMENT:        Right Left  Flexion 82  Extension 55  Side Bending 35 34  Rotation 60 52    Treatment Cervical side bend with overpressure to glenohumeral joint 2x 30 seconds Cervical rotation with overpressure to glenohumeral joint: 2x 30 seconds Suboccipital release 2x30 seconds J mobilization for postural kyphosis reduction 4x 30 seconds cervical and thoracic mobilizations grade II for reduced hypomobility x 6 minutes    Trigger Point Dry Needling (TDN), unbilled Education performed with patient regarding potential benefit of TDN. Reviewed precautions and risks with patient. Reviewed special precautions/risks over lung fields which include pneumothorax. Reviewed signs and symptoms of pneumothorax and advised pt to go to ER immediately if these symptoms develop advise them of dry needling treatment. Extensive time spent with pt to ensure full understanding of TDN risks. Pt provided verbal consent to treatment. TDN performed to  with 0.30 x 30 and .2 x 0.3 single needle placements with local twitch response  (LTR). Pistoning technique utilized. Improved pain-free motion following intervention. L and Rcervical paraspinals, Upper trap (L and R), temporalis, suboccipital musculature, R thenar eminence  x  15 minutes       PATIENT EDUCATION: Education details: exercise technique, TDN Person educated: Patient Education method: Explanation, Demonstration, Tactile cues, and Verbal cues Education comprehension: verbalized understanding, returned demonstration, verbal cues required, and tactile cues required   HOME EXERCISE PROGRAM: See prior sessions    PT Short Term Goals      PT SHORT TERM GOAL #1   Title Pt will be independent with HEP in order to decrease pain in order to improve pain-free function at home and work.    Baseline 6/7: HEP given 9/1: HEp compliant 11/15: HEP compliant    Time 6    Period Weeks    Status Achieved    Target Date 08/07/21              PT Long Term Goals -      PT LONG TERM GOAL #1   Title Patient will increase FOTO score to equal to or greater than 59%  to demonstrate statistically significant improvement in mobility and quality of life.    Baseline 6/7: 50% 9/1: 59% 11/15: 86% 2/14: 71%    Time 12    Period Weeks    Status Achieved      PT LONG TERM GOAL #2   Title Pt will decrease worst pain as reported on NPRS by at least 3 points (3/10) in order to demonstrate clinically significant reduction in pain.    Baseline 6/7: 6/10 9/1: 4/10 11/15: 5/10 2/14: 3-4/10 4/25: 6/10 10/31: 5/10 1/25: 4-5/10 5/9: 5/10 from illness but improving   Time 12    Period Weeks    Status Partially Met    Target Date 05/27/2023           PT LONG TERM GOAL #3   Title Pt will demonstrate pain-free full range cervical motion in order to perform IADLs such as driving and household chores without increase in symptoms.    Baseline 6/7: see note for limitation 9/1:flexion 54, extension 60, SB R 35 L 30, rotation R 51 L 62 11/15: flexion 58, extension 60, SB R and L  35, R rotation 59 L rotation 60 2/14: flexion 70, extension 60 SB R and L 38, R rotation 59 L 64 4/25: see note 10/31: unable to find tool for ROM assessment, will perform next session.  1/25:look above    Time 12    Period Weeks    Status Partially Met    Target Date 05/27/2023           PT LONG TERM GOAL #4   Title Patient will have less than two days a week of high pain migraines and demonstrate ability to self control/contain migraines.    Baseline 9/1:every day getting the mild ones, 2x/week for high pain migraines. 11/15: two days of high pain in past month, has mini headaches every day 2/14: has worsening headaches in the evening but only 2-3 days of high pain 4/25: 2 days of high pain 10/31: 1-2 days a week 1/25: had 4 last week; but last month was averaging 1 per week. 5/9: 2 migraines in 3 weeks ; multiple weeks with no migraine.    Time 12    Period Weeks    Status Partially Met    Target Date 05/27/2023           PT LONG TERM GOAL #5   Title Patient will reduce Neck Disability Index score to <10% to demonstrate minimal disability with ADL's including improved sleeping tolerance, sitting tolerance, etc for better mobility at home and work.    Baseline 2/14: 18% 4/25: 16% 10/31: 16% 1/25: 14% 5/9: 13%   Time 12    Period Weeks    Status Partially Met    Target Date 02/11/2023       PT LONG TERM GOAL #6  Title Patient will tolerate three hours in clinic with fluorescent lights with pain increase of <3/10 for improved quality of life with work   Baseline 10/31: pain worsens with fluorescent lights. 1/25: 2 hours with pain increase 5/9; tolerates 3 hours   Time 12   Period Weeks   Status MET  Target Date 03/31/23            PT LONG TERM GOAL #7  Title Patient will improve the Headache Disability Index  to <10% or decreased disability and improved quality of life.   Baseline 5/9: 28%   Time 12   Period Weeks   Status NEW  Target Date 05/27/2023               Plan     Clinical Impression Statement Patient has significant tension reduction by end of session. Patient session limited by limited coverage at patient's work. She requires a thenar eminence release this session due to finger contraction. She will benefit from skilled physical therapy to reduce pain, improve patient independence with symptom management, and improve quality of life    Personal Factors and Comorbidities Age;Behavior Pattern;Comorbidity 3+;Fitness;Past/Current Experience;Profession;Time since onset of injury/illness/exacerbation    Comorbidities HLD, aortic atherosclerosis, asthma, COVID 19, SOB, hypothyroidism, atypical facial pain (2019), neuropathy (2020), fibromyalgia    Examination-Activity Limitations Caring for Others;Carry;Lift;Sleep;Transfers    Examination-Participation Restrictions Cleaning;Community Activity;Interpersonal Relationship;Driving;Laundry;Shop;Occupation;Meal Prep;Yard Work    Conservation officer, historic buildings Evolving/Moderate complexity    Rehab Potential Fair    PT Frequency Biweekly    PT Duration 12 weeks    PT Treatment/Interventions ADLs/Self Care Home Management;Dry needling;Manual techniques;Therapeutic exercise;Therapeutic activities;Neuromuscular re-education;Aquatic Therapy;Biofeedback;Canalith Repostioning;Cryotherapy;Electrical Stimulation;Iontophoresis 4mg /ml Dexamethasone;Moist Heat;Ultrasound;Patient/family education;Passive range of motion;Energy conservation;Vestibular;Joint Manipulations;Traction;Gait training;Balance training;Taping;Spinal Manipulations;Visual/perceptual remediation/compensation    PT Next Visit Plan exercise technique, body mechanics    Consulted and Agree with Plan of Care Patient              Precious Bard, PT 03/31/2023, 4:39 PM

## 2023-03-31 ENCOUNTER — Ambulatory Visit: Payer: 59 | Attending: Internal Medicine

## 2023-03-31 DIAGNOSIS — G8929 Other chronic pain: Secondary | ICD-10-CM | POA: Insufficient documentation

## 2023-03-31 DIAGNOSIS — M542 Cervicalgia: Secondary | ICD-10-CM | POA: Insufficient documentation

## 2023-03-31 DIAGNOSIS — R293 Abnormal posture: Secondary | ICD-10-CM | POA: Insufficient documentation

## 2023-03-31 DIAGNOSIS — M25511 Pain in right shoulder: Secondary | ICD-10-CM | POA: Diagnosis not present

## 2023-03-31 DIAGNOSIS — R519 Headache, unspecified: Secondary | ICD-10-CM | POA: Diagnosis not present

## 2023-04-02 ENCOUNTER — Other Ambulatory Visit (HOSPITAL_COMMUNITY): Payer: Self-pay

## 2023-04-05 ENCOUNTER — Other Ambulatory Visit (HOSPITAL_COMMUNITY): Payer: Self-pay

## 2023-04-05 ENCOUNTER — Other Ambulatory Visit: Payer: Self-pay

## 2023-04-06 ENCOUNTER — Other Ambulatory Visit (HOSPITAL_COMMUNITY): Payer: Self-pay

## 2023-04-06 MED ORDER — LEVOTHYROXINE SODIUM 50 MCG PO TABS
50.0000 ug | ORAL_TABLET | Freq: Every day | ORAL | 3 refills | Status: DC
  Filled 2023-04-06: qty 90, 90d supply, fill #0
  Filled 2023-06-15 – 2023-06-19 (×2): qty 90, 90d supply, fill #1
  Filled 2023-09-29: qty 90, 90d supply, fill #2
  Filled 2023-12-31: qty 90, 90d supply, fill #3

## 2023-04-07 ENCOUNTER — Other Ambulatory Visit: Payer: Self-pay

## 2023-04-09 ENCOUNTER — Other Ambulatory Visit (HOSPITAL_COMMUNITY): Payer: Self-pay

## 2023-04-09 ENCOUNTER — Other Ambulatory Visit: Payer: Self-pay

## 2023-04-09 ENCOUNTER — Other Ambulatory Visit: Payer: Self-pay | Admitting: Family

## 2023-04-12 ENCOUNTER — Other Ambulatory Visit: Payer: Self-pay

## 2023-04-12 ENCOUNTER — Other Ambulatory Visit (HOSPITAL_COMMUNITY): Payer: Self-pay

## 2023-04-13 NOTE — Therapy (Signed)
OUTPATIENT PHYSICAL THERAPY TREATMENT NOTE  Patient Name: Rachel Fry MRN: [id number] DOB:[birth year], 67 y.o., female Today's Date: 04/14/2023  PCP: Andi Devon MD  REFERRING PROVIDER: Andi Devon MD    PT End of Session - 04/14/23 1514     Visit Number 43    Number of Visits 46    Date for PT Re-Evaluation 05/27/23    PT Start Time 1515    PT Stop Time 1557    PT Time Calculation (min) 42 min    Activity Tolerance Patient tolerated treatment well    Behavior During Therapy WFL for tasks assessed/performed                             Past Medical History:  Diagnosis Date   Allergy    Asthma    COVID-19    History of kidney stones    Hypothyroidism    Migraines    Numbness and tingling    left side of body, occasionally right side    S/P Botox injection    Urticaria    Past Surgical History:  Procedure Laterality Date   ADENOIDECTOMY     ARM NEUROPLASTY Right 1994 and 1996   for chronic pain   CESAREAN SECTION     CYSTOSCOPY W/ RETROGRADES Left 04/23/2020   Procedure: CYSTOSCOPY WITH RETROGRADE PYELOGRAM;  Surgeon: Riki Altes, MD;  Location: [facility];  Service: Urology;  Laterality: Left;   CYSTOSCOPY W/ RETROGRADES Left 05/13/2020   Procedure: CYSTOSCOPY WITH RETROGRADE PYELOGRAM;  Surgeon: Vanna Scotland, MD;  Location: [facility];  Service: Urology;  Laterality: Left;   CYSTOSCOPY WITH STENT PLACEMENT Left 04/23/2020   Procedure: CYSTOSCOPY WITH STENT PLACEMENT;  Surgeon: Riki Altes, MD;  Location: [facility];  Service: Urology;  Laterality: Left;   CYSTOSCOPY/URETEROSCOPY/HOLMIUM LASER/STENT PLACEMENT Left 05/13/2020   Procedure: CYSTOSCOPY/URETEROSCOPY/LASER/STENT EXCHANGE;  Surgeon: Vanna Scotland, MD;  Location: [facility];  Service: Urology;  Laterality: Left;   OVARIAN CYST REMOVAL  1973   STONE EXTRACTION WITH BASKET  05/13/2020   Procedure: STONE EXTRACTION WITH BASKET;  Surgeon: Vanna Scotland, MD;  Location: [facility];  Service: Urology;;   TONSILLECTOMY     TONSILLECTOMY AND ADENOIDECTOMY     TUBAL LIGATION  1988   Patient Active Problem List   Diagnosis Date Noted   Dry eyes 05/12/2022   Asthma 05/12/2022   Fibromyalgia 05/12/2022   Hyperlipidemia 05/12/2022   Osteoporosis 03/13/2021   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 hauler 12/25/2020   Chronic urticaria 12/25/2020   Moderate persistent asthma, uncomplicated 12/25/2020   Ageusia 08/12/2020   Anosmia 08/12/2020   Myalgia, epidemic 08/12/2020   Cough with exposure to COVID-19 virus 08/12/2020   Olfactory impairment 05/29/2020   Acute UTI 04/23/2020   Left ureteral stone 04/23/2020   Intermittent chest pain 02/09/2020   History of COVID-19 02/09/2020   Shortness of breath 02/09/2020   Loss of taste 02/09/2020   Loss of smell 02/09/2020   Rash 02/09/2020   Arthralgia 02/09/2020   Benign essential hypertension 10/25/2019   Gastroesophageal reflux disease without esophagitis 06/23/2019   Chronic migraine 06/23/2019   Neuropathy 11/17/2018   Atypical facial pain 06/30/2018   Hypothyroidism 05/19/2018   Postoperative history of checked last year 04/22/2017   Weakness of left arm 04/22/2017   Numbness and tingling in left arm 04/22/2017   Lower back pain 04/02/2016   Acute stress disorder 02/25/2015   Anxiety 02/25/2015   Airway hyperreactivity 02/25/2015  Bradycardia 02/25/2015   Hypersomnia 02/25/2015   Clinical depression 02/25/2015   Elevated blood sugar 02/25/2015   Fibrositis 02/25/2015   Acid reflux 02/25/2015   Hepatitis non A non B 02/25/2015   H/O disease 02/25/2015   Hypercholesteremia 02/25/2015   Headache, migraine 02/25/2015   Muscle ache 02/25/2015   Allergic rhinitis, seasonal 02/25/2015   Avitaminosis D 02/25/2015    REFERRING DIAG: Migraine  THERAPY DIAG:  Abnormal posture  Nonintractable headache, unspecified chronicity pattern, unspecified headache type  Cervicalgia  Chronic right shoulder pain  Rationale for  Evaluation and Treatment Rehabilitation  PERTINENT HISTORY: Patient is a 67 year old female who presents to physical therapy for migraines with specific referral for dry needling.  Patient has been having migraines for about 14-15 years. They have changed from front temporal all day long 2-3/10, a couple years ago started having tingling in left side of body, numbness in arm and leg. Additionally sometimes get's belly aches.  Changed medication in 2020. Was getting trigger injections but it was too expensive. PMH of HLD, aortic atherosclerosis, asthma, COVID 19, SOB, hypothyroidism, atypical facial pain (2019), neuropathy (2020), fibromyalgia   PRECAUTIONS: n/a  SUBJECTIVE Patient reports her migraines have been hanging in the background. July 1st she gets her infusion.    PAIN:  Are you having pain? Yes: NPRS scale: 2/10 Pain location: head Pain description: migraine Aggravating factors: stress, tension Relieving factors: heat, rest     TODAY'S TREATMENT:        Right Left  Flexion 82  Extension 55  Side Bending 35 34  Rotation 60 52    Treatment Cervical side bend with overpressure to glenohumeral joint 2x 30 seconds Cervical rotation with overpressure to glenohumeral joint: 2x 30 seconds Suboccipital release 2x30 seconds J mobilization for postural kyphosis reduction 4x 30 seconds cervical and thoracic mobilizations grade II for reduced hypomobility x 6 minutes  Overhead RTB Y 15x ER RTB 15x  Chin tuck with towel distraction 10x   scapular retraction into table 10x  Trigger Point Dry Needling (TDN), unbilled Education performed with patient regarding potential benefit of TDN. Reviewed precautions and risks with patient. Reviewed special precautions/risks over lung fields which include pneumothorax. Reviewed signs and symptoms of pneumothorax and advised pt to go to ER immediately if these symptoms develop advise them of dry needling treatment. Extensive time spent with  pt to ensure full understanding of TDN risks. Pt provided verbal consent to treatment. TDN performed to  with 0.30 x 30 and .2 x 0.3 single needle placements with local twitch response (LTR). Pistoning technique utilized. Improved pain-free motion following intervention. L and Rcervical paraspinals, Upper trap (L and R), temporalis, suboccipital musculature,   x  10 minutes       PATIENT EDUCATION: Education details: exercise technique, TDN Person educated: Patient Education method: Explanation, Demonstration, Tactile cues, and Verbal cues Education comprehension: verbalized understanding, returned demonstration, verbal cues required, and tactile cues required   HOME EXERCISE PROGRAM: See prior sessions    PT Short Term Goals      PT SHORT TERM GOAL #1   Title Pt will be independent with HEP in order to decrease pain in order to improve pain-free function at home and work.    Baseline 6/7: HEP given 9/1: HEp compliant 11/15: HEP compliant    Time 6    Period Weeks    Status Achieved    Target Date 08/07/21  PT Long Term Goals -      PT LONG TERM GOAL #1   Title Patient will increase FOTO score to equal to or greater than 59%    to demonstrate statistically significant improvement in mobility and quality of life.    Baseline 6/7: 50% 9/1: 59% 11/15: 86% 2/14: 71%    Time 12    Period Weeks    Status Achieved      PT LONG TERM GOAL #2   Title Pt will decrease worst pain as reported on NPRS by at least 3 points (3/10) in order to demonstrate clinically significant reduction in pain.    Baseline 6/7: 6/10 9/1: 4/10 11/15: 5/10 2/14: 3-4/10 4/25: 6/10 10/31: 5/10 1/25: 4-5/10 5/9: 5/10 from illness but improving   Time 12    Period Weeks    Status Partially Met    Target Date 05/27/2023           PT LONG TERM GOAL #3   Title Pt will demonstrate pain-free full range cervical motion in order to perform IADLs such as driving and household chores without  increase in symptoms.    Baseline 6/7: see note for limitation 9/1:flexion 54, extension 60, SB R 35 L 30, rotation R 51 L 62 11/15: flexion 58, extension 60, SB R and L 35, R rotation 59 L rotation 60 2/14: flexion 70, extension 60 SB R and L 38, R rotation 59 L 64 4/25: see note 10/31: unable to find tool for ROM assessment, will perform next session.  1/25:look above    Time 12    Period Weeks    Status Partially Met    Target Date 05/27/2023           PT LONG TERM GOAL #4   Title Patient will have less than two days a week of high pain migraines and demonstrate ability to self control/contain migraines.    Baseline 9/1:every day getting the mild ones, 2x/week for high pain migraines. 11/15: two days of high pain in past month, has mini headaches every day 2/14: has worsening headaches in the evening but only 2-3 days of high pain 4/25: 2 days of high pain 10/31: 1-2 days a week 1/25: had 4 last week; but last month was averaging 1 per week. 5/9: 2 migraines in 3 weeks ; multiple weeks with no migraine.    Time 12    Period Weeks    Status Partially Met    Target Date 05/27/2023           PT LONG TERM GOAL #5   Title Patient will reduce Neck Disability Index score to <10% to demonstrate minimal disability with ADL's including improved sleeping tolerance, sitting tolerance, etc for better mobility at home and work.    Baseline 2/14: 18% 4/25: 16% 10/31: 16% 1/25: 14% 5/9: 13%   Time 12    Period Weeks    Status Partially Met    Target Date 02/11/2023       PT LONG TERM GOAL #6  Title Patient will tolerate three hours in clinic with fluorescent lights with pain increase of <3/10 for improved quality of life with work   Baseline 10/31: pain worsens with fluorescent lights. 1/25: 2 hours with pain increase 5/9; tolerates 3 hours   Time 12   Period Weeks   Status MET  Target Date 04/14/23            PT LONG TERM GOAL #7  Title Patient will  improve the Headache  Disability Index  to <10% or decreased disability and improved quality of life.   Baseline 5/9: 28%   Time 12   Period Weeks   Status NEW  Target Date 05/27/2023              Plan     Clinical Impression Statement Patient presents with excellent motivation. Postural strengthening tolerated well with no pain increases. Patient is eager to progress pain free mobility.  She will benefit from skilled physical therapy to reduce pain, improve patient independence with symptom management, and improve quality of life    Personal Factors and Comorbidities Age;Behavior Pattern;Comorbidity 3+;Fitness;Past/Current Experience;Profession;Time since onset of injury/illness/exacerbation    Comorbidities HLD, aortic atherosclerosis, asthma, COVID 19, SOB, hypothyroidism, atypical facial pain (2019), neuropathy (2020), fibromyalgia    Examination-Activity Limitations Caring for Others;Carry;Lift;Sleep;Transfers    Examination-Participation Restrictions Cleaning;Community Activity;Interpersonal Relationship;Driving;Laundry;Shop;Occupation;Meal Prep;Yard Work    Conservation officer, historic buildings Evolving/Moderate complexity    Rehab Potential Fair    PT Frequency Biweekly    PT Duration 12 weeks    PT Treatment/Interventions ADLs/Self Care Home Management;Dry needling;Manual techniques;Therapeutic exercise;Therapeutic activities;Neuromuscular re-education;Aquatic Therapy;Biofeedback;Canalith Repostioning;Cryotherapy;Electrical Stimulation;Iontophoresis 4mg /ml Dexamethasone;Moist Heat;Ultrasound;Patient/family education;Passive range of motion;Energy conservation;Vestibular;Joint Manipulations;Traction;Gait training;Balance training;Taping;Spinal Manipulations;Visual/perceptual remediation/compensation    PT Next Visit Plan exercise technique, body mechanics    Consulted and Agree with Plan of Care Patient              Precious Bard, PT 04/14/2023, 3:58 PM

## 2023-04-14 ENCOUNTER — Ambulatory Visit: Payer: 59

## 2023-04-14 DIAGNOSIS — M542 Cervicalgia: Secondary | ICD-10-CM

## 2023-04-14 DIAGNOSIS — R519 Headache, unspecified: Secondary | ICD-10-CM

## 2023-04-14 DIAGNOSIS — G8929 Other chronic pain: Secondary | ICD-10-CM | POA: Diagnosis not present

## 2023-04-14 DIAGNOSIS — R293 Abnormal posture: Secondary | ICD-10-CM

## 2023-04-14 DIAGNOSIS — M25511 Pain in right shoulder: Secondary | ICD-10-CM | POA: Diagnosis not present

## 2023-04-16 ENCOUNTER — Other Ambulatory Visit (HOSPITAL_COMMUNITY): Payer: Self-pay

## 2023-04-22 DIAGNOSIS — G43709 Chronic migraine without aura, not intractable, without status migrainosus: Secondary | ICD-10-CM | POA: Diagnosis not present

## 2023-04-22 NOTE — Therapy (Signed)
OUTPATIENT PHYSICAL THERAPY TREATMENT NOTE  Patient Name: Rachel Fry MRN: [id number] DOB:06/08/1956, 67 y.o., female Today's Date: 04/26/2023  PCP: Andi Devon MD  REFERRING PROVIDER: Andi Devon MD    PT End of Session - 04/26/23 0850     Visit Number 44    Number of Visits 46    Date for PT Re-Evaluation 05/27/23    PT Start Time 0850    PT Stop Time 0929    PT Time Calculation (min) 39 min    Activity Tolerance Patient tolerated treatment well    Behavior During Therapy WFL for tasks assessed/performed                              Past Medical History:  Diagnosis Date   Allergy    Asthma    COVID-19    History of kidney stones    Hypothyroidism    Migraines    Numbness and tingling    left side of body, occasionally right side    S/P Botox injection    Urticaria    Past Surgical History:  Procedure Laterality Date   ADENOIDECTOMY     ARM NEUROPLASTY Right 1994 and 1996   for chronic pain   CESAREAN SECTION     CYSTOSCOPY W/ RETROGRADES Left 04/23/2020   Procedure: CYSTOSCOPY WITH RETROGRADE PYELOGRAM;  Surgeon: Riki Altes, MD;  Location: [facility];  Service: Urology;  Laterality: Left;   CYSTOSCOPY W/ RETROGRADES Left 05/13/2020   Procedure: CYSTOSCOPY WITH RETROGRADE PYELOGRAM;  Surgeon: Vanna Scotland, MD;  Location: [facility];  Service: Urology;  Laterality: Left;   CYSTOSCOPY WITH STENT PLACEMENT Left 04/23/2020   Procedure: CYSTOSCOPY WITH STENT PLACEMENT;  Surgeon: Riki Altes, MD;  Location: [facility];  Service: Urology;  Laterality: Left;   CYSTOSCOPY/URETEROSCOPY/HOLMIUM LASER/STENT PLACEMENT Left 05/13/2020   Procedure: CYSTOSCOPY/URETEROSCOPY/LASER/STENT EXCHANGE;  Surgeon: Vanna Scotland, MD;  Location: [facility];  Service: Urology;  Laterality: Left;   OVARIAN CYST REMOVAL  1973   STONE EXTRACTION WITH BASKET  05/13/2020   Procedure: STONE EXTRACTION WITH BASKET;  Surgeon: Vanna Scotland, MD;  Location: [facility]  [facility];  Service: Urology;;   TONSILLECTOMY     TONSILLECTOMY AND ADENOIDECTOMY     TUBAL LIGATION  1988   Patient Active Problem List   Diagnosis Date Noted   Dry eyes 05/12/2022   Asthma 05/12/2022   Fibromyalgia 05/12/2022   Hyperlipidemia 05/12/2022   Osteoporosis 03/13/2021   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 hauler 12/25/2020   Chronic urticaria 12/25/2020   Moderate persistent asthma, uncomplicated 12/25/2020   Ageusia 08/12/2020   Anosmia 08/12/2020   Myalgia, epidemic 08/12/2020   Cough with exposure to COVID-19 virus 08/12/2020   Olfactory impairment 05/29/2020   Acute UTI 04/23/2020   Left ureteral stone 04/23/2020   Intermittent chest pain 02/09/2020   History of COVID-19 02/09/2020   Shortness of breath 02/09/2020   Loss of taste 02/09/2020   Loss of smell 02/09/2020   Rash 02/09/2020   Arthralgia 02/09/2020   Benign essential hypertension 10/25/2019   Gastroesophageal reflux disease without esophagitis 06/23/2019   Chronic migraine 06/23/2019   Neuropathy 11/17/2018   Atypical facial pain 06/30/2018   Hypothyroidism 05/19/2018   Postoperative history of checked last year 04/22/2017   Weakness of left arm 04/22/2017   Numbness and tingling in left arm 04/22/2017   Lower back pain 04/02/2016   Acute stress disorder 02/25/2015   Anxiety 02/25/2015   Airway hyperreactivity  02/25/2015   Bradycardia 02/25/2015   Hypersomnia 02/25/2015   Clinical depression 02/25/2015   Elevated blood sugar 02/25/2015   Fibrositis 02/25/2015   Acid reflux 02/25/2015   Hepatitis non A non B 02/25/2015   H/O disease 02/25/2015   Hypercholesteremia 02/25/2015   Headache, migraine 02/25/2015   Muscle ache 02/25/2015   Allergic rhinitis, seasonal 02/25/2015   Avitaminosis D 02/25/2015    REFERRING DIAG: Migraine  THERAPY DIAG:  Abnormal posture  Nonintractable headache, unspecified chronicity pattern, unspecified headache type  Cervicalgia  Rationale for Evaluation and Treatment  Rehabilitation  PERTINENT HISTORY: Patient is a 67 year old female who presents to physical therapy for migraines with specific referral for dry needling.  Patient has been having migraines for about 14-15 years. They have changed from front temporal all day long 2-3/10, a couple years ago started having tingling in left side of body, numbness in arm and leg. Additionally sometimes get's belly aches.  Changed medication in 2020. Was getting trigger injections but it was too expensive. PMH of HLD, aortic atherosclerosis, asthma, COVID 19, SOB, hypothyroidism, atypical facial pain (2019), neuropathy (2020), fibromyalgia   PRECAUTIONS: n/a  SUBJECTIVE Patient coming from work. Has been compliant with HEP.   PAIN:  Are you having pain? Yes: NPRS scale: 2/10 Pain location: head Pain description: migraine Aggravating factors: stress, tension Relieving factors: heat, rest     TODAY'S TREATMENT:        Right Left  Flexion 82  Extension 55  Side Bending 35 34  Rotation 60 52    Treatment Cervical side bend with overpressure to glenohumeral joint 2x 30 seconds Cervical rotation with overpressure to glenohumeral joint: 2x 30 seconds Suboccipital release 2x30 seconds J mobilization for postural kyphosis reduction 4x 30 seconds cervical and thoracic mobilizations grade II for reduced hypomobility x 6 minutes  Overhead RTB Y 15x ER RTB 15x  Chin tuck with towel distraction 10x   scapular retraction into table 10x  Trigger Point Dry Needling (TDN), unbilled Education performed with patient regarding potential benefit of TDN. Reviewed precautions and risks with patient. Reviewed special precautions/risks over lung fields which include pneumothorax. Reviewed signs and symptoms of pneumothorax and advised pt to go to ER immediately if these symptoms develop advise them of dry needling treatment. Extensive time spent with pt to ensure full understanding of TDN risks. Pt provided verbal  consent to treatment. TDN performed to  with 0.30 x 30 and .2 x 0.3 single needle placements with local twitch response (LTR). Pistoning technique utilized. Improved pain-free motion following intervention. L and Rcervical paraspinals, Upper trap (L and R), temporalis, suboccipital musculature,   x  10 minutes       PATIENT EDUCATION: Education details: exercise technique, TDN Person educated: Patient Education method: Explanation, Demonstration, Tactile cues, and Verbal cues Education comprehension: verbalized understanding, returned demonstration, verbal cues required, and tactile cues required   HOME EXERCISE PROGRAM: See prior sessions    PT Short Term Goals      PT SHORT TERM GOAL #1   Title Pt will be independent with HEP in order to decrease pain in order to improve pain-free function at home and work.    Baseline 6/7: HEP given 9/1: HEp compliant 11/15: HEP compliant    Time 6    Period Weeks    Status Achieved    Target Date 08/07/21              PT Long Term Goals -  PT LONG TERM GOAL #1   Title Patient will increase FOTO score to equal to or greater than 59%    to demonstrate statistically significant improvement in mobility and quality of life.    Baseline 6/7: 50% 9/1: 59% 11/15: 86% 2/14: 71%    Time 12    Period Weeks    Status Achieved      PT LONG TERM GOAL #2   Title Pt will decrease worst pain as reported on NPRS by at least 3 points (3/10) in order to demonstrate clinically significant reduction in pain.    Baseline 6/7: 6/10 9/1: 4/10 11/15: 5/10 2/14: 3-4/10 4/25: 6/10 10/31: 5/10 1/25: 4-5/10 5/9: 5/10 from illness but improving   Time 12    Period Weeks    Status Partially Met    Target Date 05/27/2023           PT LONG TERM GOAL #3   Title Pt will demonstrate pain-free full range cervical motion in order to perform IADLs such as driving and household chores without increase in symptoms.    Baseline 6/7: see note for limitation  9/1:flexion 54, extension 60, SB R 35 L 30, rotation R 51 L 62 11/15: flexion 58, extension 60, SB R and L 35, R rotation 59 L rotation 60 2/14: flexion 70, extension 60 SB R and L 38, R rotation 59 L 64 4/25: see note 10/31: unable to find tool for ROM assessment, will perform next session.  1/25:look above    Time 12    Period Weeks    Status Partially Met    Target Date 05/27/2023           PT LONG TERM GOAL #4   Title Patient will have less than two days a week of high pain migraines and demonstrate ability to self control/contain migraines.    Baseline 9/1:every day getting the mild ones, 2x/week for high pain migraines. 11/15: two days of high pain in past month, has mini headaches every day 2/14: has worsening headaches in the evening but only 2-3 days of high pain 4/25: 2 days of high pain 10/31: 1-2 days a week 1/25: had 4 last week; but last month was averaging 1 per week. 5/9: 2 migraines in 3 weeks ; multiple weeks with no migraine.    Time 12    Period Weeks    Status Partially Met    Target Date 05/27/2023           PT LONG TERM GOAL #5   Title Patient will reduce Neck Disability Index score to <10% to demonstrate minimal disability with ADL's including improved sleeping tolerance, sitting tolerance, etc for better mobility at home and work.    Baseline 2/14: 18% 4/25: 16% 10/31: 16% 1/25: 14% 5/9: 13%   Time 12    Period Weeks    Status Partially Met    Target Date 02/11/2023       PT LONG TERM GOAL #6  Title Patient will tolerate three hours in clinic with fluorescent lights with pain increase of <3/10 for improved quality of life with work   Baseline 10/31: pain worsens with fluorescent lights. 1/25: 2 hours with pain increase 5/9; tolerates 3 hours   Time 12   Period Weeks   Status MET  Target Date 04/26/23            PT LONG TERM GOAL #7  Title Patient will improve the Headache Disability Index  to <10% or decreased  disability and improved quality  of life.   Baseline 5/9: 28%   Time 12   Period Weeks   Status NEW  Target Date 05/27/2023              Plan     Clinical Impression Statement Patient presents with good motivation to physical therapy. Education on work place set up to reduce kyphosis and shoulder rotation performed.  Patient is eager to progress her functional pain free mobility. Slight tension of L upper trap ocntinues to be present but upon further lengthening reduces by end of session. She will benefit from skilled physical therapy to reduce pain, improve patient independence with symptom management, and improve quality of life    Personal Factors and Comorbidities Age;Behavior Pattern;Comorbidity 3+;Fitness;Past/Current Experience;Profession;Time since onset of injury/illness/exacerbation    Comorbidities HLD, aortic atherosclerosis, asthma, COVID 19, SOB, hypothyroidism, atypical facial pain (2019), neuropathy (2020), fibromyalgia    Examination-Activity Limitations Caring for Others;Carry;Lift;Sleep;Transfers    Examination-Participation Restrictions Cleaning;Community Activity;Interpersonal Relationship;Driving;Laundry;Shop;Occupation;Meal Prep;Yard Work    Conservation officer, historic buildings Evolving/Moderate complexity    Rehab Potential Fair    PT Frequency Biweekly    PT Duration 12 weeks    PT Treatment/Interventions ADLs/Self Care Home Management;Dry needling;Manual techniques;Therapeutic exercise;Therapeutic activities;Neuromuscular re-education;Aquatic Therapy;Biofeedback;Canalith Repostioning;Cryotherapy;Electrical Stimulation;Iontophoresis 4mg /ml Dexamethasone;Moist Heat;Ultrasound;Patient/family education;Passive range of motion;Energy conservation;Vestibular;Joint Manipulations;Traction;Gait training;Balance training;Taping;Spinal Manipulations;Visual/perceptual remediation/compensation    PT Next Visit Plan exercise technique, body mechanics    Consulted and Agree with Plan of Care Patient               Precious Bard, PT 04/26/2023, 9:39 AM

## 2023-04-26 ENCOUNTER — Ambulatory Visit: Payer: 59 | Attending: Internal Medicine

## 2023-04-26 DIAGNOSIS — G8929 Other chronic pain: Secondary | ICD-10-CM | POA: Diagnosis not present

## 2023-04-26 DIAGNOSIS — M25511 Pain in right shoulder: Secondary | ICD-10-CM | POA: Diagnosis not present

## 2023-04-26 DIAGNOSIS — G43709 Chronic migraine without aura, not intractable, without status migrainosus: Secondary | ICD-10-CM | POA: Diagnosis not present

## 2023-04-26 DIAGNOSIS — R519 Headache, unspecified: Secondary | ICD-10-CM | POA: Diagnosis not present

## 2023-04-26 DIAGNOSIS — M542 Cervicalgia: Secondary | ICD-10-CM | POA: Diagnosis not present

## 2023-04-26 DIAGNOSIS — R293 Abnormal posture: Secondary | ICD-10-CM | POA: Diagnosis not present

## 2023-04-27 ENCOUNTER — Ambulatory Visit: Payer: 59

## 2023-05-13 ENCOUNTER — Other Ambulatory Visit (HOSPITAL_COMMUNITY): Payer: Self-pay

## 2023-05-13 ENCOUNTER — Ambulatory Visit: Payer: 59

## 2023-05-13 ENCOUNTER — Other Ambulatory Visit: Payer: Self-pay | Admitting: Allergy & Immunology

## 2023-05-13 DIAGNOSIS — R519 Headache, unspecified: Secondary | ICD-10-CM

## 2023-05-13 DIAGNOSIS — G8929 Other chronic pain: Secondary | ICD-10-CM

## 2023-05-13 DIAGNOSIS — M542 Cervicalgia: Secondary | ICD-10-CM | POA: Diagnosis not present

## 2023-05-13 DIAGNOSIS — M25511 Pain in right shoulder: Secondary | ICD-10-CM | POA: Diagnosis not present

## 2023-05-13 DIAGNOSIS — R293 Abnormal posture: Secondary | ICD-10-CM

## 2023-05-13 MED ORDER — FLUTICASONE PROPIONATE HFA 110 MCG/ACT IN AERO
2.0000 | INHALATION_SPRAY | Freq: Two times a day (BID) | RESPIRATORY_TRACT | 0 refills | Status: DC
  Filled 2023-05-13: qty 12, 30d supply, fill #0

## 2023-05-13 NOTE — Therapy (Signed)
OUTPATIENT PHYSICAL THERAPY TREATMENT NOTE  Patient Name: Rachel Fry MRN: [id number] DOB:09/23/1956, 67 y.o., female Today's Date: 05/13/2023  PCP: Andi Devon MD  REFERRING PROVIDER: Andi Devon MD    PT End of Session - 05/13/23 1616     Visit Number 45    Number of Visits 46    Date for PT Re-Evaluation 05/27/23    PT Start Time 1615    PT Stop Time 1659    PT Time Calculation (min) 44 min    Activity Tolerance Patient tolerated treatment well    Behavior During Therapy WFL for tasks assessed/performed                               Past Medical History:  Diagnosis Date   Allergy    Asthma    COVID-19    History of kidney stones    Hypothyroidism    Migraines    Numbness and tingling    left side of body, occasionally right side    S/P Botox injection    Urticaria    Past Surgical History:  Procedure Laterality Date   ADENOIDECTOMY     ARM NEUROPLASTY Right 1994 and 1996   for chronic pain   CESAREAN SECTION     CYSTOSCOPY W/ RETROGRADES Left 04/23/2020   Procedure: CYSTOSCOPY WITH RETROGRADE PYELOGRAM;  Surgeon: Riki Altes, MD;  Location: [facility];  Service: Urology;  Laterality: Left;   CYSTOSCOPY W/ RETROGRADES Left 05/13/2020   Procedure: CYSTOSCOPY WITH RETROGRADE PYELOGRAM;  Surgeon: Vanna Scotland, MD;  Location: [facility];  Service: Urology;  Laterality: Left;   CYSTOSCOPY WITH STENT PLACEMENT Left 04/23/2020   Procedure: CYSTOSCOPY WITH STENT PLACEMENT;  Surgeon: Riki Altes, MD;  Location: [facility];  Service: Urology;  Laterality: Left;   CYSTOSCOPY/URETEROSCOPY/HOLMIUM LASER/STENT PLACEMENT Left 05/13/2020   Procedure: CYSTOSCOPY/URETEROSCOPY/LASER/STENT EXCHANGE;  Surgeon: Vanna Scotland, MD;  Location: [facility];  Service: Urology;  Laterality: Left;   OVARIAN CYST REMOVAL  1973   STONE EXTRACTION WITH BASKET  05/13/2020   Procedure: STONE EXTRACTION WITH BASKET;  Surgeon: Vanna Scotland, MD;  Location:  [facility];  Service: Urology;;   TONSILLECTOMY     TONSILLECTOMY AND ADENOIDECTOMY     TUBAL LIGATION  1988   Patient Active Problem List   Diagnosis Date Noted   Dry eyes 05/12/2022   Asthma 05/12/2022   Fibromyalgia 05/12/2022   Hyperlipidemia 05/12/2022   Osteoporosis 03/13/2021   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 hauler 12/25/2020   Chronic urticaria 12/25/2020   Moderate persistent asthma, uncomplicated 12/25/2020   Ageusia 08/12/2020   Anosmia 08/12/2020   Myalgia, epidemic 08/12/2020   Cough with exposure to COVID-19 virus 08/12/2020   Olfactory impairment 05/29/2020   Acute UTI 04/23/2020   Left ureteral stone 04/23/2020   Intermittent chest pain 02/09/2020   History of COVID-19 02/09/2020   Shortness of breath 02/09/2020   Loss of taste 02/09/2020   Loss of smell 02/09/2020   Rash 02/09/2020   Arthralgia 02/09/2020   Benign essential hypertension 10/25/2019   Gastroesophageal reflux disease without esophagitis 06/23/2019   Chronic migraine 06/23/2019   Neuropathy 11/17/2018   Atypical facial pain 06/30/2018   Hypothyroidism 05/19/2018   Postoperative history of checked last year 04/22/2017   Weakness of left arm 04/22/2017   Numbness and tingling in left arm 04/22/2017   Lower back pain 04/02/2016   Acute stress disorder 02/25/2015   Anxiety 02/25/2015   Airway  hyperreactivity 02/25/2015   Bradycardia 02/25/2015   Hypersomnia 02/25/2015   Clinical depression 02/25/2015   Elevated blood sugar 02/25/2015   Fibrositis 02/25/2015   Acid reflux 02/25/2015   Hepatitis non A non B 02/25/2015   H/O disease 02/25/2015   Hypercholesteremia 02/25/2015   Headache, migraine 02/25/2015   Muscle ache 02/25/2015   Allergic rhinitis, seasonal 02/25/2015   Avitaminosis D 02/25/2015    REFERRING DIAG: Migraine  THERAPY DIAG:  Abnormal posture  Nonintractable headache, unspecified chronicity pattern, unspecified headache type  Cervicalgia  Chronic right shoulder pain  Rationale  for Evaluation and Treatment Rehabilitation  PERTINENT HISTORY: Patient is a 67 year old female who presents to physical therapy for migraines with specific referral for dry needling.  Patient has been having migraines for about 14-15 years. They have changed from front temporal all day long 2-3/10, a couple years ago started having tingling in left side of body, numbness in arm and leg. Additionally sometimes get's belly aches.  Changed medication in 2020. Was getting trigger injections but it was too expensive. PMH of HLD, aortic atherosclerosis, asthma, COVID 19, SOB, hypothyroidism, atypical facial pain (2019), neuropathy (2020), fibromyalgia   PRECAUTIONS: n/a  SUBJECTIVE Patient had to take her medicine 6 times in the last two weeks due to higher work demands and change of weather.   PAIN:  Are you having pain? Yes: NPRS scale: 2/10 Pain location: head Pain description: migraine Aggravating factors: stress, tension Relieving factors: heat, rest     TODAY'S TREATMENT:        Right Left  Flexion 82  Extension 55  Side Bending 35 34  Rotation 60 52    Treatment Cervical side bend with overpressure to glenohumeral joint 2x 30 seconds Cervical rotation with overpressure to glenohumeral joint: 2x 30 seconds Suboccipital release 2x30 seconds J mobilization for postural kyphosis reduction 4x 30 seconds cervical and thoracic mobilizations grade II for reduced hypomobility x 6 minutes  Overhead RTB Y 15x ER RTB 15x  Chin tuck with towel distraction 10x   scapular retraction into table 10x  Trigger Point Dry Needling (TDN), unbilled Education performed with patient regarding potential benefit of TDN. Reviewed precautions and risks with patient. Reviewed special precautions/risks over lung fields which include pneumothorax. Reviewed signs and symptoms of pneumothorax and advised pt to go to ER immediately if these symptoms develop advise them of dry needling treatment. Extensive  time spent with pt to ensure full understanding of TDN risks. Pt provided verbal consent to treatment. TDN performed to  with 0.30 x 30 and .2 x 0.3 single needle placements with local twitch response (LTR). Pistoning technique utilized. Improved pain-free motion following intervention. L and Rcervical paraspinals, Upper trap (L and R), temporalis, suboccipital musculature,   x  10 minutes       PATIENT EDUCATION: Education details: exercise technique, TDN Person educated: Patient Education method: Explanation, Demonstration, Tactile cues, and Verbal cues Education comprehension: verbalized understanding, returned demonstration, verbal cues required, and tactile cues required   HOME EXERCISE PROGRAM: See prior sessions    PT Short Term Goals      PT SHORT TERM GOAL #1   Title Pt will be independent with HEP in order to decrease pain in order to improve pain-free function at home and work.    Baseline 6/7: HEP given 9/1: HEp compliant 11/15: HEP compliant    Time 6    Period Weeks    Status Achieved    Target Date 08/07/21  PT Long Term Goals -      PT LONG TERM GOAL #1   Title Patient will increase FOTO score to equal to or greater than 59%    to demonstrate statistically significant improvement in mobility and quality of life.    Baseline 6/7: 50% 9/1: 59% 11/15: 86% 2/14: 71%    Time 12    Period Weeks    Status Achieved      PT LONG TERM GOAL #2   Title Pt will decrease worst pain as reported on NPRS by at least 3 points (3/10) in order to demonstrate clinically significant reduction in pain.    Baseline 6/7: 6/10 9/1: 4/10 11/15: 5/10 2/14: 3-4/10 4/25: 6/10 10/31: 5/10 1/25: 4-5/10 5/9: 5/10 from illness but improving   Time 12    Period Weeks    Status Partially Met    Target Date 05/27/2023           PT LONG TERM GOAL #3   Title Pt will demonstrate pain-free full range cervical motion in order to perform IADLs such as driving and household  chores without increase in symptoms.    Baseline 6/7: see note for limitation 9/1:flexion 54, extension 60, SB R 35 L 30, rotation R 51 L 62 11/15: flexion 58, extension 60, SB R and L 35, R rotation 59 L rotation 60 2/14: flexion 70, extension 60 SB R and L 38, R rotation 59 L 64 4/25: see note 10/31: unable to find tool for ROM assessment, will perform next session.  1/25:look above    Time 12    Period Weeks    Status Partially Met    Target Date 05/27/2023           PT LONG TERM GOAL #4   Title Patient will have less than two days a week of high pain migraines and demonstrate ability to self control/contain migraines.    Baseline 9/1:every day getting the mild ones, 2x/week for high pain migraines. 11/15: two days of high pain in past month, has mini headaches every day 2/14: has worsening headaches in the evening but only 2-3 days of high pain 4/25: 2 days of high pain 10/31: 1-2 days a week 1/25: had 4 last week; but last month was averaging 1 per week. 5/9: 2 migraines in 3 weeks ; multiple weeks with no migraine.    Time 12    Period Weeks    Status Partially Met    Target Date 05/27/2023           PT LONG TERM GOAL #5   Title Patient will reduce Neck Disability Index score to <10% to demonstrate minimal disability with ADL's including improved sleeping tolerance, sitting tolerance, etc for better mobility at home and work.    Baseline 2/14: 18% 4/25: 16% 10/31: 16% 1/25: 14% 5/9: 13%   Time 12    Period Weeks    Status Partially Met    Target Date 02/11/2023       PT LONG TERM GOAL #6  Title Patient will tolerate three hours in clinic with fluorescent lights with pain increase of <3/10 for improved quality of life with work   Baseline 10/31: pain worsens with fluorescent lights. 1/25: 2 hours with pain increase 5/9; tolerates 3 hours   Time 12   Period Weeks   Status MET  Target Date 05/13/23            PT LONG TERM GOAL #7  Title Patient will  improve the  Headache Disability Index  to <10% or decreased disability and improved quality of life.   Baseline 5/9: 28%   Time 12   Period Weeks   Status NEW  Target Date 05/27/2023              Plan     Clinical Impression Statement Patient has significant trigger point in L upper trap this session. L thoracic mobilization cavitation occurred with grade II mobilizations. Patient is under increased stress/tension at work at this time.  She will benefit from skilled physical therapy to reduce pain, improve patient independence with symptom management, and improve quality of life    Personal Factors and Comorbidities Age;Behavior Pattern;Comorbidity 3+;Fitness;Past/Current Experience;Profession;Time since onset of injury/illness/exacerbation    Comorbidities HLD, aortic atherosclerosis, asthma, COVID 19, SOB, hypothyroidism, atypical facial pain (2019), neuropathy (2020), fibromyalgia    Examination-Activity Limitations Caring for Others;Carry;Lift;Sleep;Transfers    Examination-Participation Restrictions Cleaning;Community Activity;Interpersonal Relationship;Driving;Laundry;Shop;Occupation;Meal Prep;Yard Work    Conservation officer, historic buildings Evolving/Moderate complexity    Rehab Potential Fair    PT Frequency Biweekly    PT Duration 12 weeks    PT Treatment/Interventions ADLs/Self Care Home Management;Dry needling;Manual techniques;Therapeutic exercise;Therapeutic activities;Neuromuscular re-education;Aquatic Therapy;Biofeedback;Canalith Repostioning;Cryotherapy;Electrical Stimulation;Iontophoresis 4mg /ml Dexamethasone;Moist Heat;Ultrasound;Patient/family education;Passive range of motion;Energy conservation;Vestibular;Joint Manipulations;Traction;Gait training;Balance training;Taping;Spinal Manipulations;Visual/perceptual remediation/compensation    PT Next Visit Plan exercise technique, body mechanics    Consulted and Agree with Plan of Care Patient              Precious Bard,  PT 05/13/2023, 5:01 PM

## 2023-05-14 ENCOUNTER — Other Ambulatory Visit (HOSPITAL_COMMUNITY): Payer: Self-pay

## 2023-05-14 ENCOUNTER — Other Ambulatory Visit: Payer: Self-pay

## 2023-05-17 ENCOUNTER — Other Ambulatory Visit: Payer: Self-pay

## 2023-05-17 ENCOUNTER — Ambulatory Visit: Payer: 59

## 2023-05-17 DIAGNOSIS — I10 Essential (primary) hypertension: Secondary | ICD-10-CM | POA: Diagnosis not present

## 2023-05-17 DIAGNOSIS — Z0001 Encounter for general adult medical examination with abnormal findings: Secondary | ICD-10-CM | POA: Diagnosis not present

## 2023-05-17 DIAGNOSIS — G629 Polyneuropathy, unspecified: Secondary | ICD-10-CM | POA: Diagnosis not present

## 2023-05-17 DIAGNOSIS — G43909 Migraine, unspecified, not intractable, without status migrainosus: Secondary | ICD-10-CM | POA: Diagnosis not present

## 2023-05-17 DIAGNOSIS — K581 Irritable bowel syndrome with constipation: Secondary | ICD-10-CM | POA: Diagnosis not present

## 2023-05-17 DIAGNOSIS — E785 Hyperlipidemia, unspecified: Secondary | ICD-10-CM | POA: Diagnosis not present

## 2023-05-17 DIAGNOSIS — G4730 Sleep apnea, unspecified: Secondary | ICD-10-CM | POA: Diagnosis not present

## 2023-05-18 ENCOUNTER — Other Ambulatory Visit (HOSPITAL_COMMUNITY): Payer: Self-pay

## 2023-05-18 ENCOUNTER — Ambulatory Visit (INDEPENDENT_AMBULATORY_CARE_PROVIDER_SITE_OTHER): Payer: 59 | Admitting: Allergy & Immunology

## 2023-05-18 ENCOUNTER — Encounter: Payer: Self-pay | Admitting: Allergy & Immunology

## 2023-05-18 ENCOUNTER — Other Ambulatory Visit: Payer: Self-pay

## 2023-05-18 VITALS — BP 124/76 | HR 87 | Temp 97.7°F | Resp 16

## 2023-05-18 DIAGNOSIS — J4540 Moderate persistent asthma, uncomplicated: Secondary | ICD-10-CM

## 2023-05-18 DIAGNOSIS — H43811 Vitreous degeneration, right eye: Secondary | ICD-10-CM | POA: Diagnosis not present

## 2023-05-18 DIAGNOSIS — L508 Other urticaria: Secondary | ICD-10-CM | POA: Diagnosis not present

## 2023-05-18 DIAGNOSIS — U099 Post covid-19 condition, unspecified: Secondary | ICD-10-CM

## 2023-05-18 MED ORDER — FLUTICASONE PROPIONATE HFA 110 MCG/ACT IN AERO
2.0000 | INHALATION_SPRAY | Freq: Two times a day (BID) | RESPIRATORY_TRACT | 5 refills | Status: DC
  Filled 2023-05-18: qty 12, 30d supply, fill #0
  Filled 2023-06-15: qty 36, 90d supply, fill #0
  Filled 2023-09-13: qty 36, 90d supply, fill #1

## 2023-05-18 MED ORDER — ZAFIRLUKAST 10 MG PO TABS
10.0000 mg | ORAL_TABLET | Freq: Two times a day (BID) | ORAL | 1 refills | Status: DC
  Filled 2023-05-18 – 2023-07-31 (×2): qty 180, 90d supply, fill #0
  Filled 2023-12-06: qty 180, 90d supply, fill #1

## 2023-05-18 NOTE — Progress Notes (Unsigned)
FOLLOW UP  Date of Service/Encounter:  05/18/23   Assessment:   Moderate persistent asthma, uncomplicated - present before COVID19 infection, but worsening since the COVID19 infection with current nagging cough that is worse with physical activity   IgE (October 2021) of 1557 (collected PRIOR to Xolair initiation for her urticaria)   Inability to tolerate combination inhalers (worsening thrush/sore throat)   Chronic idiopathic urticaria - improved by not fully resolved on Xolair every 28 days   Chronic rhinitis - seemingly improved on the Xolair for her hives   Long COVID syndrome  Plan/Recommendations:   1. Post COVID syndrome with urticarial rash - Continue with Xolair every two weeks at home as your are doing.  - Continue with cetirizine 10mg  at night (you can add on a morning dose if you want to see if that helps).   2. Moderate persistent asthma, uncomplicated - Lung testing looks great today.  - I think that this is your new baseline.  - Daily controller medication(s): Flovent 2 puffs twice daily with spacer and Accolate twice daily - Prior to physical activity: albuterol 2 puffs 10-15 minutes before physical activity. - Rescue medications: albuterol 4 puffs every 4-6 hours as needed - Changes during respiratory infections or worsening symptoms: Increase Flovent to 4 puffs twice daily for TWO WEEKS. - Asthma control goals:  * Full participation in all desired activities (may need albuterol before activity) * Albuterol use two time or less a week on average (not counting use with activity) * Cough interfering with sleep two time or less a month * Oral steroids no more than once a year * No hospitalizations  3. Return in about 6 months (around 11/18/2023).   Subjective:   Rachel Fry is a 67 y.o. female presenting today for follow up of  Chief Complaint  Patient presents with   Asthma   Allergic Rhinitis     Rachel Fry has a history of the  following: Patient Active Problem List   Diagnosis Date Noted   Dry eyes 05/12/2022   Asthma 05/12/2022   Fibromyalgia 05/12/2022   Hyperlipidemia 05/12/2022   Osteoporosis 03/13/2021   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 hauler 12/25/2020   Chronic urticaria 12/25/2020   Moderate persistent asthma, uncomplicated 12/25/2020   Ageusia 08/12/2020   Anosmia 08/12/2020   Myalgia, epidemic 08/12/2020   Cough with exposure to COVID-19 virus 08/12/2020   Olfactory impairment 05/29/2020   Acute UTI 04/23/2020   Left ureteral stone 04/23/2020   Intermittent chest pain 02/09/2020   History of COVID-19 02/09/2020   Shortness of breath 02/09/2020   Loss of taste 02/09/2020   Loss of smell 02/09/2020   Rash 02/09/2020   Arthralgia 02/09/2020   Benign essential hypertension 10/25/2019   Gastroesophageal reflux disease without esophagitis 06/23/2019   Chronic migraine 06/23/2019   Neuropathy 11/17/2018   Atypical facial pain 06/30/2018   Hypothyroidism 05/19/2018   Postoperative history of checked last year 04/22/2017   Weakness of left arm 04/22/2017   Numbness and tingling in left arm 04/22/2017   Lower back pain 04/02/2016   Acute stress disorder 02/25/2015   Anxiety 02/25/2015   Airway hyperreactivity 02/25/2015   Bradycardia 02/25/2015   Hypersomnia 02/25/2015   Clinical depression 02/25/2015   Elevated blood sugar 02/25/2015   Fibrositis 02/25/2015   Acid reflux 02/25/2015   Hepatitis non A non B 02/25/2015   H/O disease 02/25/2015   Hypercholesteremia 02/25/2015   Headache, migraine 02/25/2015   Muscle ache 02/25/2015  Allergic rhinitis, seasonal 02/25/2015   Avitaminosis D 02/25/2015    History obtained from: chart review and patient.  Rachel Fry is a 67 y.o. female presenting for a follow up visit.  She was last seen in January 2024.  At that time, she was continued on Xolair every 2 weeks as well as cetirizine at night.  For her asthma, we did not do lung testing.  We continue with Flovent  110 mcg 2 puffs twice daily as well as Accolate twice daily.  We also gave her a pack of prednisone in case she needed it on her trip to [place].  Since last visit, she has done very well.  Asthma/Respiratory Symptom History: She does have some SOB with physical axtivity. It is not really any better. She is able to work out - not too extensively, but she just paces her. She has the fatigue that goes with this.  This has become her new normal.  She has just figured out ways to take breaks during the day to make it through.  She has not been on prednisone or been to the emergency room for any of her breathing issues.  She did end up using the prednisone that we gave her when she was on her trip.  She had a viral URI when she was in [place].  Allergic Rhinitis Symptom History: She remains on the Flovent BID and the Accolate twice daily.  She is doing the Xolair every two weeks at home.  She has not been on antibiotics.  Sinus disease is under very good control.  Skin Symptom History: Her Xolair is controlling her urticaria.  Itching is very minimal.  She injects herself at home and does great with that.  She would like to stay on the same dosing of the Xolair.  She had an eye exam. She is dilated. She had floaters, but otherwise has been fine.  She has no retinal issues at all.  Otherwise, there have been no changes to her past medical history, surgical history, family history, or social history.    Review of systems otherwise negative other than that mentioned in the HPI.    Objective:   Blood pressure 124/76, pulse 87, temperature 97.7 F (36.5 C), temperature source Temporal, resp. rate 16, SpO2 97%. There is no height or weight on file to calculate BMI.    Physical Exam Vitals reviewed.  Constitutional:      Appearance: She is well-developed.     Comments: Talkative. Pleasant.   HENT:     Head: Normocephalic and atraumatic.     Right Ear: Tympanic membrane, ear canal and  external ear normal.     Left Ear: Tympanic membrane, ear canal and external ear normal.     Nose: Rhinorrhea present. No nasal deformity, septal deviation or mucosal edema.     Right Turbinates: Enlarged, swollen and pale.     Left Turbinates: Enlarged, swollen and pale.     Right Sinus: No maxillary sinus tenderness or frontal sinus tenderness.     Left Sinus: No maxillary sinus tenderness or frontal sinus tenderness.     Comments: No nasal polyps noted.     Mouth/Throat:     Lips: Pink.     Mouth: Mucous membranes are moist. Mucous membranes are not pale and not dry.     Pharynx: Uvula midline.     Comments: Cobblestoning in the posterior oropharynx.  Eyes:     General: Lids are normal. Allergic shiner present.  Right eye: No discharge.        Left eye: No discharge.     Conjunctiva/sclera: Conjunctivae normal.     Right eye: Right conjunctiva is not injected. No chemosis.    Left eye: Left conjunctiva is not injected. No chemosis.    Pupils: Pupils are equal, round, and reactive to light.  Cardiovascular:     Rate and Rhythm: Normal rate and regular rhythm.     Heart sounds: Normal heart sounds.  Pulmonary:     Effort: Pulmonary effort is normal. No tachypnea, accessory muscle usage or respiratory distress.     Breath sounds: Normal breath sounds. No wheezing, rhonchi or rales.     Comments: Moving air well in all lung fields. No increased work of breathing noted.  Chest:     Chest wall: No tenderness.  Lymphadenopathy:     Cervical: No cervical adenopathy.  Skin:    General: Skin is warm.     Capillary Refill: Capillary refill takes less than 2 seconds.     Coloration: Skin is not pale.     Findings: No abrasion, erythema, petechiae or rash. Rash is not papular, urticarial or vesicular.     Comments: No urticaria noted.   Neurological:     Mental Status: She is alert.  Psychiatric:        Behavior: Behavior is cooperative.      Diagnostic studies:     Spirometry: results normal (FEV1: 1.74/74%, FVC: 2.49/82%, FEV1/FVC: 70%).    Spirometry consistent with normal pattern.    Allergy Studies: none        Malachi Bonds, MD  [facility]

## 2023-05-18 NOTE — Patient Instructions (Addendum)
1. Post COVID syndrome with urticarial rash - Continue with Xolair every two weeks at home as your are doing.  - Continue with cetirizine 10mg  at night (you can add on a morning dose if you want to see if that helps).   2. Moderate persistent asthma, uncomplicated - Lung testing looks great today.  - I think that this is your new baseline.  - Daily controller medication(s): Flovent 2 puffs twice daily with spacer and Accolate twice daily - Prior to physical activity: albuterol 2 puffs 10-15 minutes before physical activity. - Rescue medications: albuterol 4 puffs every 4-6 hours as needed - Changes during respiratory infections or worsening symptoms: Increase Flovent to 4 puffs twice daily for TWO WEEKS. - Asthma control goals:  * Full participation in all desired activities (may need albuterol before activity) * Albuterol use two time or less a week on average (not counting use with activity) * Cough interfering with sleep two time or less a month * Oral steroids no more than once a year * No hospitalizations  3. Return in about 6 months (around 11/18/2023).    Please inform us of any Emergency Department visits, hospitalizations, or changes in symptoms. Call us before going to the ED for breathing or allergy symptoms since we might be able to fit you in for a sick visit. Feel free to contact us anytime with any questions, problems, or concerns.  It was a pleasure to see you again today!  Websites that have reliable patient information: 1. American Academy of Asthma, Allergy, and Immunology: www.aaaai.org 2. Food Allergy Research and Education (FARE): foodallergy.org 3. Mothers of Asthmatics: http://www.asthmacommunitynetwork.org 4. American College of Allergy, Asthma, and Immunology: www.acaai.org   COVID-19 Vaccine Information can be found at: [email or web address].[email or web address] For questions related to vaccine distribution or  appointments, please email [email or web address] or call [phone].   We realize that you might be concerned about having an allergic reaction to the COVID19 vaccines. To help with that concern, WE ARE OFFERING THE COVID19 VACCINES IN OUR OFFICE! Ask the front desk for dates!     "Like" [place] on Facebook and Instagram for our latest updates!      A healthy democracy works best when [employer] participate! Make sure you are registered to vote! If you have moved or changed any of your contact information, you will need to get this updated before voting!  In some cases, you MAY be able to register to vote online: [email or web address]

## 2023-05-19 ENCOUNTER — Other Ambulatory Visit: Payer: Self-pay

## 2023-05-19 ENCOUNTER — Encounter: Payer: Self-pay | Admitting: Allergy & Immunology

## 2023-05-19 ENCOUNTER — Other Ambulatory Visit (HOSPITAL_COMMUNITY): Payer: Self-pay

## 2023-05-20 ENCOUNTER — Other Ambulatory Visit: Payer: Self-pay

## 2023-05-26 NOTE — Therapy (Signed)
OUTPATIENT PHYSICAL THERAPY TREATMENT NOTE/Re-Cert ***  Patient Name: Rachel Fry MRN: [id number] DOB:25-Jul-1956, 67 y.o., female Today's Date: 05/26/2023  PCP: Andi Devon MD  REFERRING PROVIDER: Andi Devon MD                        Past Medical History:  Diagnosis Date   Allergy    Asthma    COVID-19    History of kidney stones    Hypothyroidism    Migraines    Numbness and tingling    left side of body, occasionally right side    S/P Botox injection    Urticaria    Past Surgical History:  Procedure Laterality Date   ADENOIDECTOMY     ARM NEUROPLASTY Right 1994 and 1996   for chronic pain   CESAREAN SECTION     CYSTOSCOPY W/ RETROGRADES Left 04/23/2020   Procedure: CYSTOSCOPY WITH RETROGRADE PYELOGRAM;  Surgeon: Riki Altes, MD;  Location: [facility];  Service: Urology;  Laterality: Left;   CYSTOSCOPY W/ RETROGRADES Left 05/13/2020   Procedure: CYSTOSCOPY WITH RETROGRADE PYELOGRAM;  Surgeon: Vanna Scotland, MD;  Location: [facility];  Service: Urology;  Laterality: Left;   CYSTOSCOPY WITH STENT PLACEMENT Left 04/23/2020   Procedure: CYSTOSCOPY WITH STENT PLACEMENT;  Surgeon: Riki Altes, MD;  Location: [facility];  Service: Urology;  Laterality: Left;   CYSTOSCOPY/URETEROSCOPY/HOLMIUM LASER/STENT PLACEMENT Left 05/13/2020   Procedure: CYSTOSCOPY/URETEROSCOPY/LASER/STENT EXCHANGE;  Surgeon: Vanna Scotland, MD;  Location: [facility];  Service: Urology;  Laterality: Left;   OVARIAN CYST REMOVAL  1973   STONE EXTRACTION WITH BASKET  05/13/2020   Procedure: STONE EXTRACTION WITH BASKET;  Surgeon: Vanna Scotland, MD;  Location: [facility];  Service: Urology;;   TONSILLECTOMY     TONSILLECTOMY AND ADENOIDECTOMY     TUBAL LIGATION  1988   Patient Active Problem List   Diagnosis Date Noted   Dry eyes 05/12/2022   Asthma 05/12/2022   Fibromyalgia 05/12/2022   Hyperlipidemia 05/12/2022   Osteoporosis 03/13/2021   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 hauler  12/25/2020   Chronic urticaria 12/25/2020   Moderate persistent asthma, uncomplicated 12/25/2020   Ageusia 08/12/2020   Anosmia 08/12/2020   Myalgia, epidemic 08/12/2020   Cough with exposure to COVID-19 virus 08/12/2020   Olfactory impairment 05/29/2020   Acute UTI 04/23/2020   Left ureteral stone 04/23/2020   Intermittent chest pain 02/09/2020   History of COVID-19 02/09/2020   Shortness of breath 02/09/2020   Loss of taste 02/09/2020   Loss of smell 02/09/2020   Rash 02/09/2020   Arthralgia 02/09/2020   Benign essential hypertension 10/25/2019   Gastroesophageal reflux disease without esophagitis 06/23/2019   Chronic migraine 06/23/2019   Neuropathy 11/17/2018   Atypical facial pain 06/30/2018   Hypothyroidism 05/19/2018   Postoperative history of checked last year 04/22/2017   Weakness of left arm 04/22/2017   Numbness and tingling in left arm 04/22/2017   Lower back pain 04/02/2016   Acute stress disorder 02/25/2015   Anxiety 02/25/2015   Airway hyperreactivity 02/25/2015   Bradycardia 02/25/2015   Hypersomnia 02/25/2015   Clinical depression 02/25/2015   Elevated blood sugar 02/25/2015   Fibrositis 02/25/2015   Acid reflux 02/25/2015   Hepatitis non A non B 02/25/2015   H/O disease 02/25/2015   Hypercholesteremia 02/25/2015   Headache, migraine 02/25/2015   Muscle ache 02/25/2015   Allergic rhinitis, seasonal 02/25/2015   Avitaminosis D 02/25/2015    REFERRING DIAG: Migraine  THERAPY DIAG:  No diagnosis  found.  Rationale for Evaluation and Treatment Rehabilitation  PERTINENT HISTORY: Patient is a 67 year old female who presents to physical therapy for migraines with specific referral for dry needling.  Patient has been having migraines for about 14-15 years. They have changed from front temporal all day long 2-3/10, a couple years ago started having tingling in left side of body, numbness in arm and leg. Additionally sometimes get's belly aches.  Changed  medication in 2020. Was getting trigger injections but it was too expensive. PMH of HLD, aortic atherosclerosis, asthma, COVID 19, SOB, hypothyroidism, atypical facial pain (2019), neuropathy (2020), fibromyalgia   PRECAUTIONS: n/a  SUBJECTIVE ***  PAIN:  Are you having pain? Yes: NPRS scale: 2/10 Pain location: head Pain description: migraine Aggravating factors: stress, tension Relieving factors: heat, rest     TODAY'S TREATMENT:        Right Left  Flexion 82  Extension 55  Side Bending 35 34  Rotation 60 52    Treatment Goals performed. See below for details  Cervical side bend with overpressure to glenohumeral joint 2x 30 seconds Cervical rotation with overpressure to glenohumeral joint: 2x 30 seconds Suboccipital release 2x30 seconds J mobilization for postural kyphosis reduction 4x 30 seconds cervical and thoracic mobilizations grade II for reduced hypomobility x 6 minutes  Overhead RTB Y 15x ER RTB 15x  Chin tuck with towel distraction 10x   scapular retraction into table 10x  Trigger Point Dry Needling (TDN), unbilled Education performed with patient regarding potential benefit of TDN. Reviewed precautions and risks with patient. Reviewed special precautions/risks over lung fields which include pneumothorax. Reviewed signs and symptoms of pneumothorax and advised pt to go to ER immediately if these symptoms develop advise them of dry needling treatment. Extensive time spent with pt to ensure full understanding of TDN risks. Pt provided verbal consent to treatment. TDN performed to  with 0.30 x 30 and .2 x 0.3 single needle placements with local twitch response (LTR). Pistoning technique utilized. Improved pain-free motion following intervention. L and Rcervical paraspinals, Upper trap (L and R), temporalis, suboccipital musculature,   x  10 minutes       PATIENT EDUCATION: Education details: exercise technique, TDN Person educated: Patient Education method:  Explanation, Demonstration, Tactile cues, and Verbal cues Education comprehension: verbalized understanding, returned demonstration, verbal cues required, and tactile cues required   HOME EXERCISE PROGRAM: See prior sessions    PT Short Term Goals      PT SHORT TERM GOAL #1   Title Pt will be independent with HEP in order to decrease pain in order to improve pain-free function at home and work.    Baseline 6/7: HEP given 9/1: HEp compliant 11/15: HEP compliant    Time 6    Period Weeks    Status Achieved    Target Date 08/07/21              PT Long Term Goals -      PT LONG TERM GOAL #1   Title Patient will increase FOTO score to equal to or greater than 59%    to demonstrate statistically significant improvement in mobility and quality of life.    Baseline 6/7: 50% 9/1: 59% 11/15: 86% 2/14: 71%    Time 12    Period Weeks    Status Achieved      PT LONG TERM GOAL #2   Title Pt will decrease worst pain as reported on NPRS by at least 3 points (3/10) in  order to demonstrate clinically significant reduction in pain.    Baseline 6/7: 6/10 9/1: 4/10 11/15: 5/10 2/14: 3-4/10 4/25: 6/10 10/31: 5/10 1/25: 4-5/10 5/9: 5/10 from illness but improving   Time 12    Period Weeks    Status Partially Met    Target Date 05/27/2023           PT LONG TERM GOAL #3   Title Pt will demonstrate pain-free full range cervical motion in order to perform IADLs such as driving and household chores without increase in symptoms.    Baseline 6/7: see note for limitation 9/1:flexion 54, extension 60, SB R 35 L 30, rotation R 51 L 62 11/15: flexion 58, extension 60, SB R and L 35, R rotation 59 L rotation 60 2/14: flexion 70, extension 60 SB R and L 38, R rotation 59 L 64 4/25: see note 10/31: unable to find tool for ROM assessment, will perform next session.  1/25:look above    Time 12    Period Weeks    Status Partially Met    Target Date 05/27/2023           PT LONG TERM GOAL #4   Title  Patient will have less than two days a week of high pain migraines and demonstrate ability to self control/contain migraines.    Baseline 9/1:every day getting the mild ones, 2x/week for high pain migraines. 11/15: two days of high pain in past month, has mini headaches every day 2/14: has worsening headaches in the evening but only 2-3 days of high pain 4/25: 2 days of high pain 10/31: 1-2 days a week 1/25: had 4 last week; but last month was averaging 1 per week. 5/9: 2 migraines in 3 weeks ; multiple weeks with no migraine.    Time 12    Period Weeks    Status Partially Met    Target Date 05/27/2023           PT LONG TERM GOAL #5   Title Patient will reduce Neck Disability Index score to <10% to demonstrate minimal disability with ADL's including improved sleeping tolerance, sitting tolerance, etc for better mobility at home and work.    Baseline 2/14: 18% 4/25: 16% 10/31: 16% 1/25: 14% 5/9: 13%   Time 12    Period Weeks    Status Partially Met    Target Date 02/11/2023       PT LONG TERM GOAL #6  Title Patient will tolerate three hours in clinic with fluorescent lights with pain increase of <3/10 for improved quality of life with work   Baseline 10/31: pain worsens with fluorescent lights. 1/25: 2 hours with pain increase 5/9; tolerates 3 hours   Time 12   Period Weeks   Status MET  Target Date 05/26/23            PT LONG TERM GOAL #7  Title Patient will improve the Headache Disability Index  to <10% or decreased disability and improved quality of life.   Baseline 5/9: 28%   Time 12   Period Weeks   Status NEW  Target Date 05/27/2023              Plan     Clinical Impression Statement ***.  She will benefit from skilled physical therapy to reduce pain, improve patient independence with symptom management, and improve quality of life    Personal Factors and Comorbidities Age;Behavior Pattern;Comorbidity 3+;Fitness;Past/Current Experience;Profession;Time since  onset of injury/illness/exacerbation  Comorbidities HLD, aortic atherosclerosis, asthma, COVID 19, SOB, hypothyroidism, atypical facial pain (2019), neuropathy (2020), fibromyalgia    Examination-Activity Limitations Caring for Others;Carry;Lift;Sleep;Transfers    Examination-Participation Restrictions Cleaning;Community Activity;Interpersonal Relationship;Driving;Laundry;Shop;Occupation;Meal Prep;Yard Work    Conservation officer, historic buildings Evolving/Moderate complexity    Rehab Potential Fair    PT Frequency Biweekly    PT Duration 12 weeks    PT Treatment/Interventions ADLs/Self Care Home Management;Dry needling;Manual techniques;Therapeutic exercise;Therapeutic activities;Neuromuscular re-education;Aquatic Therapy;Biofeedback;Canalith Repostioning;Cryotherapy;Electrical Stimulation;Iontophoresis 4mg /ml Dexamethasone;Moist Heat;Ultrasound;Patient/family education;Passive range of motion;Energy conservation;Vestibular;Joint Manipulations;Traction;Gait training;Balance training;Taping;Spinal Manipulations;Visual/perceptual remediation/compensation    PT Next Visit Plan exercise technique, body mechanics    Consulted and Agree with Plan of Care Patient              Precious Bard, PT 05/26/2023, 3:02 PM

## 2023-05-27 ENCOUNTER — Ambulatory Visit: Payer: 59 | Attending: Internal Medicine

## 2023-05-27 DIAGNOSIS — R519 Headache, unspecified: Secondary | ICD-10-CM | POA: Diagnosis not present

## 2023-05-27 DIAGNOSIS — M25511 Pain in right shoulder: Secondary | ICD-10-CM | POA: Insufficient documentation

## 2023-05-27 DIAGNOSIS — R293 Abnormal posture: Secondary | ICD-10-CM | POA: Diagnosis not present

## 2023-05-27 DIAGNOSIS — M542 Cervicalgia: Secondary | ICD-10-CM | POA: Insufficient documentation

## 2023-05-27 DIAGNOSIS — G8929 Other chronic pain: Secondary | ICD-10-CM | POA: Insufficient documentation

## 2023-05-28 ENCOUNTER — Other Ambulatory Visit: Payer: Self-pay | Admitting: Physician Assistant

## 2023-05-28 ENCOUNTER — Encounter: Payer: Self-pay | Admitting: Physician Assistant

## 2023-05-28 ENCOUNTER — Ambulatory Visit: Payer: 59 | Admitting: Pharmacist

## 2023-05-28 DIAGNOSIS — M818 Other osteoporosis without current pathological fracture: Secondary | ICD-10-CM

## 2023-05-28 DIAGNOSIS — Z79899 Other long term (current) drug therapy: Secondary | ICD-10-CM

## 2023-05-28 NOTE — Progress Notes (Signed)
S: Patient presents for review of their specialty medication therapy.  Patient is currently taking Xolair for urticaria/asthma. Patient is managed by Dr. Dellis Anes for this.   Adherence: confirmed  Efficacy: reports that, although not at 100%, the medication appears to be working. She can tell a couple of days before her next injection.   Dosing: Give subcutaneously.    Asthma: Chronic idiopathic urticaria: SubQ: 150 or 300 mg every 4 weeks. Dosing is not dependent on serum IgE (free or total) level or body weight.  Dose adjustments: Renal: no dose adjustments  Hepatic: no dose adjustments  Toxicity: Severe hypersensitivity reaction or anaphylaxis: Discontinue treatment. Fever, arthralgia, and rash: Discontinue treatment if this constellation of symptoms occurs.  Drug-drug interactions: no interactions identified   Monitoring: CV effects: none Eosinophilia and vasculitis: none Fever/arthralgia/rash: none Hypersensitivity/Anaphylaxis: none Malignant neoplasms: none  O:  Lab Results  Component Value Date   WBC 8.1 12/22/2022   HGB 16.4 (H) 12/22/2022   HCT 51.4 (H) 12/22/2022   MCV 93.6 12/22/2022   PLT 261 12/22/2022      Chemistry      Component Value Date/Time   NA 136 12/22/2022 1035   NA 146 (H) 02/09/2020 1019   NA 139 08/29/2014 1112   K 3.5 12/22/2022 1035   K 4.3 08/29/2014 1112   CL 94 (L) 12/22/2022 1035   CL 103 08/29/2014 1112   CO2 28 12/22/2022 1035   CO2 33 (H) 08/29/2014 1112   BUN 21 12/22/2022 1035   BUN 11 02/09/2020 1019   BUN 17 08/29/2014 1112   CREATININE 1.19 (H) 12/22/2022 1035   CREATININE 0.98 08/29/2014 1112   GLU 83 03/30/2014 0000      Component Value Date/Time   CALCIUM 10.8 (H) 12/22/2022 1035   CALCIUM 9.3 08/29/2014 1112   ALKPHOS 60 12/22/2022 1035   ALKPHOS 69 08/29/2014 1112   AST 120 (H) 12/22/2022 1035   AST 19 08/29/2014 1112   ALT 206 (H) 12/22/2022 1035   ALT 23 08/29/2014 1112   BILITOT 1.1 12/22/2022  1035   BILITOT 0.4 02/09/2020 1019   BILITOT 0.3 08/29/2014 1112       A/P: 1. Medication review: patient currently on Xolair for urticaria and is tolerating it well. Reviewed the medication with the patient, including the following: Xolair, omalizumab, is a novel IgE blocker.  It appears to reduce rates of hospitalizations, ER visits and unscheduled physician visits due asthma exacerbations when added to standard therapy.  Studies also show a reduction in steroid requirements and improvement in quality of life.  Patient educated on purpose, proper use and potential adverse effects of Xolair.  Following instruction patient verbalized understanding. Patient should always have an EpiPen readily available in the event of anaphylaxis. SubQ: For SubQ injection only; doses >150 mg should be divided over more than one injection site (eg, 225 mg or 300 mg administered as two injections, 375 mg administered as three injections); each injection site should be separated by ?1 inch. Do not inject into moles, scars, bruises, tender areas, or broken skin. Injections may take 5 to 10 seconds to administer (solution is slightly viscous). Administer only under direct medical supervision and observe patient for 2 hours after the first 3 injections and 30 minutes after subsequent injections Angela Nevin 2015) or in accordance with individual institution policies and procedures.No recommendations for any changes at this time.   Butch Penny, PharmD, P[place]y [place], CPP Clinical Pharmacist [facility] [phone]

## 2023-06-01 ENCOUNTER — Other Ambulatory Visit: Payer: Self-pay | Admitting: Physician Assistant

## 2023-06-01 DIAGNOSIS — Z1231 Encounter for screening mammogram for malignant neoplasm of breast: Secondary | ICD-10-CM

## 2023-06-10 ENCOUNTER — Other Ambulatory Visit (HOSPITAL_COMMUNITY): Payer: Self-pay

## 2023-06-14 DIAGNOSIS — M818 Other osteoporosis without current pathological fracture: Secondary | ICD-10-CM | POA: Diagnosis not present

## 2023-06-15 ENCOUNTER — Other Ambulatory Visit (HOSPITAL_COMMUNITY): Payer: Self-pay

## 2023-06-16 ENCOUNTER — Other Ambulatory Visit: Payer: Self-pay

## 2023-06-16 ENCOUNTER — Other Ambulatory Visit (HOSPITAL_COMMUNITY): Payer: Self-pay

## 2023-06-16 MED ORDER — CLONIDINE HCL 0.1 MG PO TABS
0.1000 mg | ORAL_TABLET | Freq: Two times a day (BID) | ORAL | 3 refills | Status: DC
  Filled 2023-07-01: qty 180, 90d supply, fill #0
  Filled 2023-09-29: qty 180, 90d supply, fill #1
  Filled 2023-12-31: qty 180, 90d supply, fill #2
  Filled 2024-03-05: qty 180, 90d supply, fill #3

## 2023-06-21 ENCOUNTER — Encounter: Payer: Self-pay | Admitting: Allergy & Immunology

## 2023-06-21 NOTE — Therapy (Incomplete)
OUTPATIENT PHYSICAL THERAPY TREATMENT NOTE  Patient Name: Rachel Fry MRN: [id number] DOB:[birth year], 67 y.o., female Today's Date: 06/21/2023  PCP: Andi Devon MD  REFERRING PROVIDER: Andi Devon MD                         Past Medical History:  Diagnosis Date   Allergy    Asthma    COVID-19    History of kidney stones    Hypothyroidism    Migraines    Numbness and tingling    left side of body, occasionally right side    S/P Botox injection    Urticaria    Past Surgical History:  Procedure Laterality Date   ADENOIDECTOMY     ARM NEUROPLASTY Right 1994 and 1996   for chronic pain   CESAREAN SECTION     CYSTOSCOPY W/ RETROGRADES Left 04/23/2020   Procedure: CYSTOSCOPY WITH RETROGRADE PYELOGRAM;  Surgeon: Riki Altes, MD;  Location: [facility];  Service: Urology;  Laterality: Left;   CYSTOSCOPY W/ RETROGRADES Left 05/13/2020   Procedure: CYSTOSCOPY WITH RETROGRADE PYELOGRAM;  Surgeon: Vanna Scotland, MD;  Location: [facility];  Service: Urology;  Laterality: Left;   CYSTOSCOPY WITH STENT PLACEMENT Left 04/23/2020   Procedure: CYSTOSCOPY WITH STENT PLACEMENT;  Surgeon: Riki Altes, MD;  Location: [facility];  Service: Urology;  Laterality: Left;   CYSTOSCOPY/URETEROSCOPY/HOLMIUM LASER/STENT PLACEMENT Left 05/13/2020   Procedure: CYSTOSCOPY/URETEROSCOPY/LASER/STENT EXCHANGE;  Surgeon: Vanna Scotland, MD;  Location: [facility];  Service: Urology;  Laterality: Left;   OVARIAN CYST REMOVAL  1973   STONE EXTRACTION WITH BASKET  05/13/2020   Procedure: STONE EXTRACTION WITH BASKET;  Surgeon: Vanna Scotland, MD;  Location: [facility];  Service: Urology;;   TONSILLECTOMY     TONSILLECTOMY AND ADENOIDECTOMY     TUBAL LIGATION  1988   Patient Active Problem List   Diagnosis Date Noted   Dry eyes 05/12/2022   Asthma 05/12/2022   Fibromyalgia 05/12/2022   Hyperlipidemia 05/12/2022   Osteoporosis 03/13/2021   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 hauler 12/25/2020    Chronic urticaria 12/25/2020   Moderate persistent asthma, uncomplicated 12/25/2020   Ageusia 08/12/2020   Anosmia 08/12/2020   Myalgia, epidemic 08/12/2020   Cough with exposure to COVID-19 virus 08/12/2020   Olfactory impairment 05/29/2020   Acute UTI 04/23/2020   Left ureteral stone 04/23/2020   Intermittent chest pain 02/09/2020   History of COVID-19 02/09/2020   Shortness of breath 02/09/2020   Loss of taste 02/09/2020   Loss of smell 02/09/2020   Rash 02/09/2020   Arthralgia 02/09/2020   Benign essential hypertension 10/25/2019   Gastroesophageal reflux disease without esophagitis 06/23/2019   Chronic migraine 06/23/2019   Neuropathy 11/17/2018   Atypical facial pain 06/30/2018   Hypothyroidism 05/19/2018   Postoperative history of checked last year 04/22/2017   Weakness of left arm 04/22/2017   Numbness and tingling in left arm 04/22/2017   Lower back pain 04/02/2016   Acute stress disorder 02/25/2015   Anxiety 02/25/2015   Airway hyperreactivity 02/25/2015   Bradycardia 02/25/2015   Hypersomnia 02/25/2015   Clinical depression 02/25/2015   Elevated blood sugar 02/25/2015   Fibrositis 02/25/2015   Acid reflux 02/25/2015   Hepatitis non A non B 02/25/2015   H/O disease 02/25/2015   Hypercholesteremia 02/25/2015   Headache, migraine 02/25/2015   Muscle ache 02/25/2015   Allergic rhinitis, seasonal 02/25/2015   Avitaminosis D 02/25/2015    REFERRING DIAG: Migraine  THERAPY DIAG:  No diagnosis  found.  Rationale for Evaluation and Treatment Rehabilitation  PERTINENT HISTORY: Patient is a 67 year old female who presents to physical therapy for migraines with specific referral for dry needling.  Patient has been having migraines for about 14-15 years. They have changed from front temporal all day long 2-3/10, a couple years ago started having tingling in left side of body, numbness in arm and leg. Additionally sometimes get's belly aches.  Changed medication in  2020. Was getting trigger injections but it was too expensive. PMH of HLD, aortic atherosclerosis, asthma, COVID 19, SOB, hypothyroidism, atypical facial pain (2019), neuropathy (2020), fibromyalgia   PRECAUTIONS: n/a  SUBJECTIVE ***  PAIN:  Are you having pain? Yes: NPRS scale: 2/10 Pain location: head Pain description: migraine Aggravating factors: stress, tension Relieving factors: heat, rest     TODAY'S TREATMENT:        Right Left  Flexion 83  Extension 55  Side Bending 39 38  Rotation 62 54    Treatment   Cervical side bend with overpressure to glenohumeral joint 2x 30 seconds Cervical rotation with overpressure to glenohumeral joint: 2x 30 seconds Suboccipital release 2x30 seconds J mobilization for postural kyphosis reduction 4x 30 seconds cervical and thoracic mobilizations grade II for reduced hypomobility x 6 minutes  Trigger Point Dry Needling (TDN), unbilled Education performed with patient regarding potential benefit of TDN. Reviewed precautions and risks with patient. Reviewed special precautions/risks over lung fields which include pneumothorax. Reviewed signs and symptoms of pneumothorax and advised pt to go to ER immediately if these symptoms develop advise them of dry needling treatment. Extensive time spent with pt to ensure full understanding of TDN risks. Pt provided verbal consent to treatment. TDN performed to  with 0.30 x 30 and .2 x 0.3 single needle placements with local twitch response (LTR). Pistoning technique utilized. Improved pain-free motion following intervention. L and Rcervical paraspinals, Upper trap (L and R), temporalis, suboccipital musculature,   x  10 minutes       PATIENT EDUCATION: Education details: exercise technique, TDN Person educated: Patient Education method: Explanation, Demonstration, Tactile cues, and Verbal cues Education comprehension: verbalized understanding, returned demonstration, verbal cues required, and  tactile cues required   HOME EXERCISE PROGRAM: See prior sessions    PT Short Term Goals      PT SHORT TERM GOAL #1   Title Pt will be independent with HEP in order to decrease pain in order to improve pain-free function at home and work.    Baseline 6/7: HEP given 9/1: HEp compliant 11/15: HEP compliant    Time 6    Period Weeks    Status Achieved    Target Date 08/07/21              PT Long Term Goals -      PT LONG TERM GOAL #1   Title Patient will increase FOTO score to equal to or greater than 59%    to demonstrate statistically significant improvement in mobility and quality of life.    Baseline 6/7: 50% 9/1: 59% 11/15: 86% 2/14: 71%    Time 12    Period Weeks    Status Achieved      PT LONG TERM GOAL #2   Title Pt will decrease worst pain as reported on NPRS by at least 3 points (3/10) in order to demonstrate clinically significant reduction in pain.    Baseline 6/7: 6/10 9/1: 4/10 11/15: 5/10 2/14: 3-4/10 4/25: 6/10 10/31: 5/10 1/25: 4-5/10 5/9:  5/10 from illness but improving 8/1: 5/10    Time 12    Period Weeks    Status Partially Met    Target Date 08/19/2023             PT LONG TERM GOAL #3   Title Pt will demonstrate pain-free full range cervical motion in order to perform IADLs such as driving and household chores without increase in symptoms.    Baseline 6/7: see note for limitation 9/1:flexion 54, extension 60, SB R 35 L 30, rotation R 51 L 62 11/15: flexion 58, extension 60, SB R and L 35, R rotation 59 L rotation 60 2/14: flexion 70, extension 60 SB R and L 38, R rotation 59 L 64 4/25: see note 10/31: unable to find tool for ROM assessment, will perform next session.  1/25:look above 8/1: [facility]   Time 12    Period Weeks    Status MET   Target Date 05/27/2023           PT LONG TERM GOAL #4   Title Patient will have less than two days a week of high pain migraines and demonstrate ability to self control/contain migraines.    Baseline  9/1:every day getting the mild ones, 2x/week for high pain migraines. 11/15: two days of high pain in past month, has mini headaches every day 2/14: has worsening headaches in the evening but only 2-3 days of high pain 4/25: 2 days of high pain 10/31: 1-2 days a week 1/25: had 4 last week; but last month was averaging 1 per week. 5/9: 2 migraines in 3 weeks ; multiple weeks with no migraine. 8/1: migraines with storms but averaging major migraine 1x/month   Time 12    Period Weeks    Status Partially Met    Target Date 08/19/2023             PT LONG TERM GOAL #5   Title Patient will reduce Neck Disability Index score to <10% to demonstrate minimal disability with ADL's including improved sleeping tolerance, sitting tolerance, etc for better mobility at home and work.    Baseline 2/14: 18% 4/25: 16% 10/31: 16% 1/25: 14% 5/9: 13% 8/1: 14   Time 12    Period Weeks    Status Partially Met    Target Date 08/19/2023         PT LONG TERM GOAL #6  Title Patient will tolerate three hours in clinic with fluorescent lights with pain increase of <3/10 for improved quality of life with work   Baseline 10/31: pain worsens with fluorescent lights. 1/25: 2 hours with pain increase 5/9; tolerates 3 hours   Time 12   Period Weeks   Status MET  Target Date 06/21/23            PT LONG TERM GOAL #7  Title Patient will improve the Headache Disability Index  to <10% or decreased disability and improved quality of life.   Baseline 5/9: 28%  8/1: 12%  Time 12   Period Weeks   Status Partially Met  Target Date 08/19/2023           PT LONG TERM GOAL #8  Title Patient will be able to attend gun range 1x/month without increase of pain >2/10 from baseline for return to sporting activities.   Baseline 8/1: immediate increase of pain   Time 12   Period Weeks   Status NEW  Target Date 08/19/2023  Plan     Clinical Impression Statement *** She will benefit  from skilled physical therapy to reduce pain, improve patient independence with symptom management, and improve quality of life    Personal Factors and Comorbidities Age;Behavior Pattern;Comorbidity 3+;Fitness;Past/Current Experience;Profession;Time since onset of injury/illness/exacerbation    Comorbidities HLD, aortic atherosclerosis, asthma, COVID 19, SOB, hypothyroidism, atypical facial pain (2019), neuropathy (2020), fibromyalgia    Examination-Activity Limitations Caring for Others;Carry;Lift;Sleep;Transfers    Examination-Participation Restrictions Cleaning;Community Activity;Interpersonal Relationship;Driving;Laundry;Shop;Occupation;Meal Prep;Yard Work    Conservation officer, historic buildings Evolving/Moderate complexity    Rehab Potential Fair    PT Frequency Biweekly    PT Duration 12 weeks    PT Treatment/Interventions ADLs/Self Care Home Management;Dry needling;Manual techniques;Therapeutic exercise;Therapeutic activities;Neuromuscular re-education;Aquatic Therapy;Biofeedback;Canalith Repostioning;Cryotherapy;Electrical Stimulation;Iontophoresis 4mg /ml Dexamethasone;Moist Heat;Ultrasound;Patient/family education;Passive range of motion;Energy conservation;Vestibular;Joint Manipulations;Traction;Gait training;Balance training;Taping;Spinal Manipulations;Visual/perceptual remediation/compensation    PT Next Visit Plan exercise technique, body mechanics    Consulted and Agree with Plan of Care Patient              Precious Bard, PT 06/21/2023, 2:53 PM

## 2023-06-22 ENCOUNTER — Ambulatory Visit: Payer: 59

## 2023-06-22 DIAGNOSIS — R519 Headache, unspecified: Secondary | ICD-10-CM

## 2023-06-22 DIAGNOSIS — R293 Abnormal posture: Secondary | ICD-10-CM

## 2023-06-22 DIAGNOSIS — G8929 Other chronic pain: Secondary | ICD-10-CM

## 2023-06-22 DIAGNOSIS — M25511 Pain in right shoulder: Secondary | ICD-10-CM | POA: Diagnosis not present

## 2023-06-22 DIAGNOSIS — M542 Cervicalgia: Secondary | ICD-10-CM | POA: Diagnosis not present

## 2023-06-23 NOTE — Therapy (Signed)
OUTPATIENT PHYSICAL THERAPY TREATMENT Patient Name: Rachel Fry MRN: [id number] DOB:1956/01/26, 67 y.o., female Today's Date: 06/23/2023  PCP: Andi Devon MD  REFERRING PROVIDER: Andi Devon MD    PT End of Session - 06/22/23 1532     Visit Number 47    Number of Visits 58    Date for PT Re-Evaluation 08/19/23    Authorization Type [place] [place] [employer]    Authorization Time Period 05/27/23-08/19/23    Progress Note Due on Visit 50    PT Start Time 1400    PT Stop Time 1445    PT Time Calculation (min) 45 min    Activity Tolerance Patient tolerated treatment well;No increased pain    Behavior During Therapy WFL for tasks assessed/performed            Past Medical History:  Diagnosis Date   Allergy    Asthma    COVID-19    History of kidney stones    Hypothyroidism    Migraines    Numbness and tingling    left side of body, occasionally right side    S/P Botox injection    Urticaria    Past Surgical History:  Procedure Laterality Date   ADENOIDECTOMY     ARM NEUROPLASTY Right 1994 and 1996   for chronic pain   CESAREAN SECTION     CYSTOSCOPY W/ RETROGRADES Left 04/23/2020   Procedure: CYSTOSCOPY WITH RETROGRADE PYELOGRAM;  Surgeon: Rachel Altes, MD;  Location: [facility];  Service: Urology;  Laterality: Left;   CYSTOSCOPY W/ RETROGRADES Left 05/13/2020   Procedure: CYSTOSCOPY WITH RETROGRADE PYELOGRAM;  Surgeon: Rachel Scotland, MD;  Location: [facility];  Service: Urology;  Laterality: Left;   CYSTOSCOPY WITH STENT PLACEMENT Left 04/23/2020   Procedure: CYSTOSCOPY WITH STENT PLACEMENT;  Surgeon: Rachel Altes, MD;  Location: [facility];  Service: Urology;  Laterality: Left;   CYSTOSCOPY/URETEROSCOPY/HOLMIUM LASER/STENT PLACEMENT Left 05/13/2020   Procedure: CYSTOSCOPY/URETEROSCOPY/LASER/STENT EXCHANGE;  Surgeon: Rachel Scotland, MD;  Location: [facility];  Service: Urology;  Laterality: Left;   OVARIAN CYST REMOVAL  1973   STONE EXTRACTION WITH  BASKET  05/13/2020   Procedure: STONE EXTRACTION WITH BASKET;  Surgeon: Rachel Scotland, MD;  Location: [facility];  Service: Urology;;   TONSILLECTOMY     TONSILLECTOMY AND ADENOIDECTOMY     TUBAL LIGATION  1988   Patient Active Problem List   Diagnosis Date Noted   Dry eyes 05/12/2022   Asthma 05/12/2022   Fibromyalgia 05/12/2022   Hyperlipidemia 05/12/2022   Osteoporosis 03/13/2021   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 hauler 12/25/2020   Chronic urticaria 12/25/2020   Moderate persistent asthma, uncomplicated 12/25/2020   Ageusia 08/12/2020   Anosmia 08/12/2020   Myalgia, epidemic 08/12/2020   Cough with exposure to COVID-19 virus 08/12/2020   Olfactory impairment 05/29/2020   Acute UTI 04/23/2020   Left ureteral stone 04/23/2020   Intermittent chest pain 02/09/2020   History of COVID-19 02/09/2020   Shortness of breath 02/09/2020   Loss of taste 02/09/2020   Loss of smell 02/09/2020   Rash 02/09/2020   Arthralgia 02/09/2020   Benign essential hypertension 10/25/2019   Gastroesophageal reflux disease without esophagitis 06/23/2019   Chronic migraine 06/23/2019   Neuropathy 11/17/2018   Atypical facial pain 06/30/2018   Hypothyroidism 05/19/2018   Postoperative history of checked last year 04/22/2017   Weakness of left arm 04/22/2017   Numbness and tingling in left arm 04/22/2017   Lower back pain 04/02/2016   Acute stress disorder 02/25/2015  Anxiety 02/25/2015   Airway hyperreactivity 02/25/2015   Bradycardia 02/25/2015   Hypersomnia 02/25/2015   Clinical depression 02/25/2015   Elevated blood sugar 02/25/2015   Fibrositis 02/25/2015   Acid reflux 02/25/2015   Hepatitis non A non B 02/25/2015   H/O disease 02/25/2015   Hypercholesteremia 02/25/2015   Headache, migraine 02/25/2015   Muscle ache 02/25/2015   Allergic rhinitis, seasonal 02/25/2015   Avitaminosis D 02/25/2015    REFERRING DIAG: Migraine  THERAPY DIAG:  Abnormal posture  Nonintractable headache, unspecified  chronicity pattern, unspecified headache type  Cervicalgia  Chronic right shoulder pain  Rationale for Evaluation and Treatment Rehabilitation  PERTINENT HISTORY: Patient is a 67 year old female who presents to physical therapy for migraines with specific referral for dry needling.  Patient has been having migraines for about 14-15 years. They have changed from front temporal all day long 2-3/10, a couple years ago started having tingling in left side of body, numbness in arm and leg. Additionally sometimes get's belly aches.  Changed medication in 2020. Was getting trigger injections but it was too expensive. PMH of HLD, aortic atherosclerosis, asthma, COVID 19, SOB, hypothyroidism, atypical facial pain (2019), neuropathy (2020), fibromyalgia   PRECAUTIONS: n/a  SUBJECTIVE No speciifc updates. Continues to manage flares as needed with daily and PRN medications, application of heat.   PAIN:  Are you having pain? Yes: NPRS scale: 2/10 Pain location: head Pain description: migraine Aggravating factors: stress, tension Relieving factors: heat, rest   TODAY'S TREATMENT 06/23/23  Trigger Point Dry Needling  -Pt provided verbal consent to treatment  -0.30x85mm and 0.30x70mm (all performed in supine)  -Bilat upper trapezius, Left temporalis   Pin and Stretch techniques: -left upper trap with P/ROM Left shoulder from 90 degrees toward neutral x20 -left temporalis with A/ROM mouth opening x15  -bilat cervical extensors/suboccipital group paired with A/ROM capital flexion (noddling) x20  MFR Left upper trapezius, bilat cervical extensors/suboccipital group *Right upper trap void of any notable taut bands or trigger points, same with cervical extensors bilat which are largely non-tender to palpation  Supine cervical rotation P/ROM stretch 1x45 second bilat: ~75 degrees Left, 65 degrees Right   Therapeutic heat apparatus application to bilat neck region intermittently throughout session  for pain modulation, CNS stimulation, vasodilation, targeted tone modulation (TTM)  Supine cervical retraction to neutral against the force of 2 opposing pillows beneath 15x5secH  Short sitting C0/C1 self mobilization left: end range rotation capital nod x20   PATIENT EDUCATION: Education details: C0/C1 self mobilization  Person educated: Patient Education method: Explanation, Demonstration, Tactile cues, and Verbal cues Education comprehension: verbalized understanding, returned demonstration, verbal cues required, and tactile cues required   HOME EXERCISE PROGRAM: *See prior sessions; no updates 06/23/23    PT Short Term Goals      PT SHORT TERM GOAL #1   Title Pt will be independent with HEP in order to decrease pain in order to improve pain-free function at home and work.    Baseline 6/7: HEP given 9/1: HEp compliant 11/15: HEP compliant    Time 6    Period Weeks    Status Achieved    Target Date 08/07/21              PT Long Term Goals -      PT LONG TERM GOAL #1   Title Patient will increase FOTO score to equal to or greater than 59%    to demonstrate statistically significant improvement in mobility and quality of life.  Baseline 6/7: 50% 9/1: 59% 11/15: 86% 2/14: 71%    Time 12    Period Weeks    Status Achieved      PT LONG TERM GOAL #2   Title Pt will decrease worst pain as reported on NPRS by at least 3 points (3/10) in order to demonstrate clinically significant reduction in pain.    Baseline 6/7: 6/10 9/1: 4/10 11/15: 5/10 2/14: 3-4/10 4/25: 6/10 10/31: 5/10 1/25: 4-5/10 5/9: 5/10 from illness but improving 8/1: 5/10    Time 12    Period Weeks    Status Partially Met    Target Date 08/19/2023      PT LONG TERM GOAL #3   Title Pt will demonstrate pain-free full range cervical motion in order to perform IADLs such as driving and household chores without increase in symptoms.    Baseline 6/7: see note for limitation 9/1:flexion 54, extension 60, SB R  35 L 30, rotation R 51 L 62 11/15: flexion 58, extension 60, SB R and L 35, R rotation 59 L rotation 60 2/14: flexion 70, extension 60 SB R and L 38, R rotation 59 L 64 4/25: see note 10/31: unable to find tool for ROM assessment, will perform next session.  1/25:look above 8/1: [facility]   Time 12    Period Weeks    Status MET   Target Date 05/27/2023      PT LONG TERM GOAL #4   Title Patient will have less than two days a week of high pain migraines and demonstrate ability to self control/contain migraines.    Baseline 9/1:every day getting the mild ones, 2x/week for high pain migraines. 11/15: two days of high pain in past month, has mini headaches every day 2/14: has worsening headaches in the evening but only 2-3 days of high pain 4/25: 2 days of high pain 10/31: 1-2 days a week 1/25: had 4 last week; but last month was averaging 1 per week. 5/9: 2 migraines in 3 weeks ; multiple weeks with no migraine. 8/1: migraines with storms but averaging major migraine 1x/month   Time 12    Period Weeks    Status Partially Met    Target Date 08/19/2023      PT LONG TERM GOAL #5   Title Patient will reduce Neck Disability Index score to <10% to demonstrate minimal disability with ADL's including improved sleeping tolerance, sitting tolerance, etc for better mobility at home and work.    Baseline 2/14: 18% 4/25: 16% 10/31: 16% 1/25: 14% 5/9: 13% 8/1: 14   Time 12    Period Weeks    Status Partially Met    Target Date 08/19/2023     PT LONG TERM GOAL #6  Title Patient will tolerate three hours in clinic with fluorescent lights with pain increase of <3/10 for improved quality of life with work   Baseline 10/31: pain worsens with fluorescent lights. 1/25: 2 hours with pain increase 5/9; tolerates 3 hours   Time 12   Period Weeks   Status MET  Target Date 06/23/23         PT LONG TERM GOAL #7  Title Patient will improve the Headache Disability Index  to <10% or decreased disability and improved  quality of life.   Baseline 5/9: 28%  8/1: 12%  Time 12   Period Weeks   Status Partially Met  Target Date 08/19/2023    PT LONG TERM GOAL #8  Title Patient will be able  to attend gun range 1x/month without increase of pain >2/10 from baseline for return to sporting activities.   Baseline 8/1: immediate increase of pain   Time 12   Period Weeks   Status NEW  Target Date 08/19/2023        Plan     Clinical Impression Statement Continued address recurrent known triggerpoints about the neck and face. Pt gives verbal feedback on interventions to maintain within tolerable means and consistent approach. No pain exacerbation during session. She will benefit from skilled physical therapy to reduce pain, improve patient independence with symptom management, and improve quality of life    Personal Factors and Comorbidities Age;Behavior Pattern;Comorbidity 3+;Fitness;Past/Current Experience;Profession;Time since onset of injury/illness/exacerbation    Comorbidities HLD, aortic atherosclerosis, asthma, COVID 19, SOB, hypothyroidism, atypical facial pain (2019), neuropathy (2020), fibromyalgia    Examination-Activity Limitations Caring for Others;Carry;Lift;Sleep;Transfers    Examination-Participation Restrictions Cleaning;Community Activity;Interpersonal Relationship;Driving;Laundry;Shop;Occupation;Meal Prep;Yard Work    Conservation officer, historic buildings Evolving/Moderate complexity    Rehab Potential Fair    PT Frequency Biweekly    PT Duration 12 weeks    PT Treatment/Interventions ADLs/Self Care Home Management;Dry needling;Manual techniques;Therapeutic exercise;Therapeutic activities;Neuromuscular re-education;Aquatic Therapy;Biofeedback;Canalith Repostioning;Cryotherapy;Electrical Stimulation;Iontophoresis 4mg /ml Dexamethasone;Moist Heat;Ultrasound;Patient/family education;Passive range of motion;Energy conservation;Vestibular;Joint Manipulations;Traction;Gait training;Balance  training;Taping;Spinal Manipulations;Visual/perceptual remediation/compensation    PT Next Visit Plan Maintenance trigger point therapy, soft tissue mobilization    Consulted and Agree with Plan of Care Patient            8:54 AM, 06/23/23 Rosamaria Lints, PT, DPT Physical Therapist - [facility]  Outpatient Physical Therapy- Main Campus [phone]     Valatie C, PT 06/23/2023, 8:54 AM

## 2023-07-01 ENCOUNTER — Other Ambulatory Visit: Payer: Self-pay

## 2023-07-01 ENCOUNTER — Other Ambulatory Visit: Payer: Self-pay | Admitting: Allergy & Immunology

## 2023-07-01 ENCOUNTER — Other Ambulatory Visit (HOSPITAL_COMMUNITY): Payer: Self-pay

## 2023-07-01 NOTE — Telephone Encounter (Signed)
Called patient to get clarification on refill request. Refills were sent in 1/24 with a refill.

## 2023-07-01 NOTE — Telephone Encounter (Signed)
Patient called back stating at work she has had to use her inhaler and is still reacting to perfumes and different scents. Please advise.

## 2023-07-01 NOTE — Telephone Encounter (Signed)
Forwarding message to provider for next step. 

## 2023-07-02 ENCOUNTER — Other Ambulatory Visit: Payer: Self-pay

## 2023-07-02 ENCOUNTER — Other Ambulatory Visit (HOSPITAL_COMMUNITY): Payer: Self-pay

## 2023-07-02 MED ORDER — EPINEPHRINE 0.3 MG/0.3ML IJ SOAJ
0.3000 mg | INTRAMUSCULAR | 1 refills | Status: DC | PRN
  Filled 2023-07-02: qty 2, 28d supply, fill #0

## 2023-07-06 ENCOUNTER — Other Ambulatory Visit: Payer: Self-pay | Admitting: Internal Medicine

## 2023-07-06 ENCOUNTER — Ambulatory Visit: Payer: 59 | Attending: Internal Medicine

## 2023-07-06 DIAGNOSIS — M25511 Pain in right shoulder: Secondary | ICD-10-CM | POA: Diagnosis not present

## 2023-07-06 DIAGNOSIS — M542 Cervicalgia: Secondary | ICD-10-CM | POA: Diagnosis not present

## 2023-07-06 DIAGNOSIS — G8929 Other chronic pain: Secondary | ICD-10-CM | POA: Diagnosis not present

## 2023-07-06 DIAGNOSIS — R519 Headache, unspecified: Secondary | ICD-10-CM | POA: Insufficient documentation

## 2023-07-06 DIAGNOSIS — Z1231 Encounter for screening mammogram for malignant neoplasm of breast: Secondary | ICD-10-CM

## 2023-07-06 DIAGNOSIS — R293 Abnormal posture: Secondary | ICD-10-CM | POA: Diagnosis not present

## 2023-07-06 NOTE — Therapy (Signed)
OUTPATIENT PHYSICAL THERAPY TREATMENT Patient Name: Rachel Fry MRN: [id number] DOB:[birth year], 67 y.o., female Today's Date: 07/06/2023  PCP: Andi Devon MD  REFERRING PROVIDER: Andi Devon MD    PT End of Session - 07/06/23 1433     Visit Number 48    Number of Visits 58    Date for PT Re-Evaluation 08/19/23    Authorization Type [employer]    Authorization Time Period 05/27/23-08/19/23    Progress Note Due on Visit 50    PT Start Time 1400    PT Stop Time 1445    PT Time Calculation (min) 45 min    Activity Tolerance Patient tolerated treatment well;No increased pain    Behavior During Therapy WFL for tasks assessed/performed            Past Medical History:  Diagnosis Date   Allergy    Asthma    COVID-19    History of kidney stones    Hypothyroidism    Migraines    Numbness and tingling    left side of body, occasionally right side    S/P Botox injection    Urticaria       REFERRING DIAG: Migraine  THERAPY DIAG:  Abnormal posture  Nonintractable headache, unspecified chronicity pattern, unspecified headache type  Cervicalgia  Chronic right shoulder pain  Rationale for Evaluation and Treatment Rehabilitation  PERTINENT HISTORY: Rachel Fry is a 67yoF who presents to physical therapy for chronic migraines/other associate headache and neck pain with specific referral for dry needling.  Patient has been having migraines for ~2005. They have changed from front temporal all day long 2-3/10, a couple years ago started having tingling in left side of body, numbness in arm and leg. Additionally sometimes get's belly aches.  Changed medication in 2020. Was getting trigger injections but it was too expensive. PMH of HLD, aortic atherosclerosis, asthma, COVID-19, SOB, hypothyroidism, atypical facial pain (2019), neuropathy (2020), fibromyalgia.   PRECAUTIONS: n/a  SUBJECTIVE No speciifc updates. A little sore after last session but within  what is typical for her.   PAIN:  Are you having pain? Yes 2/10 Rt upper trap area    TODAY'S TREATMENT 07/06/23 Trigger point dry needling as below: -in supine bilat upper trapezius, anterior-most band: 0.3mmx40mm 3 sticks bilat -in prone bilat uppr trapezius, mid belly and more medial fibers, no specific taut bands, mild repeat twitches as anterior bands again 0.69mmx40mm  -in prone spelius capitius bilat with suboccipital approach 0.46mmx40mm (no specific twitch or resistance felt)  -Left lateral suboccipitals 0.57mmx40mm   In prone.. -bilat Ws (horizontal ABDCT in ER + scapular retraction) 1x10 -bilat Is (arms at side, palms facing ceiling) shoulder extension + scapular retraction 1x10 Rachel Fry")  Pillow placed under torso, shoulders to umbilicus -"airplane" (horizontal shoulder ABDCT (elbows fully extended, palms to floor) + scapular retraction) 1x15  In supine -"pillow destroyers" retraction x15 (2 pillows, starting in slight flexion)  -bilat dumbbell chest press 1x12 c 5lb weights  -standing shoulder lateral raises 1x12 -standing bilat shrugs (scapular elevation) 5lb free weights bilat      PATIENT EDUCATION: Education details: C0/C1 self mobilization  Person educated: Patient Education method: Explanation, Demonstration, Tactile cues, and Verbal cues Education comprehension: verbalized understanding, returned demonstration, verbal cues required, and tactile cues required   HOME EXERCISE PROGRAM: *See prior sessions; no updates 07/06/23    PT Short Term Goals      PT SHORT TERM GOAL #1   Title Pt will be independent  with HEP in order to decrease pain in order to improve pain-free function at home and work.    Baseline 6/7: HEP given 9/1: HEp compliant 11/15: HEP compliant    Time 6    Period Weeks    Status Achieved    Target Date 08/07/21              PT Long Term Goals -      PT LONG TERM GOAL #1   Title Patient will increase FOTO score to  equal to or greater than 59%    to demonstrate statistically significant improvement in mobility and quality of life.    Baseline 6/7: 50% 9/1: 59% 11/15: 86% 2/14: 71%    Time 12    Period Weeks    Status Achieved      PT LONG TERM GOAL #2   Title Pt will decrease worst pain as reported on NPRS by at least 3 points (3/10) in order to demonstrate clinically significant reduction in pain.    Baseline 6/7: 6/10 9/1: 4/10 11/15: 5/10 2/14: 3-4/10 4/25: 6/10 10/31: 5/10 1/25: 4-5/10 5/9: 5/10 from illness but improving 8/1: 5/10    Time 12    Period Weeks    Status Partially Met    Target Date 08/19/2023      PT LONG TERM GOAL #3   Title Pt will demonstrate pain-free full range cervical motion in order to perform IADLs such as driving and household chores without increase in symptoms.    Baseline 6/7: see note for limitation 9/1:flexion 54, extension 60, SB R 35 L 30, rotation R 51 L 62 11/15: flexion 58, extension 60, SB R and L 35, R rotation 59 L rotation 60 2/14: flexion 70, extension 60 SB R and L 38, R rotation 59 L 64 4/25: see note 10/31: unable to find tool for ROM assessment, will perform next session.  1/25:look above 8/1: [facility]   Time 12    Period Weeks    Status MET   Target Date 05/27/2023      PT LONG TERM GOAL #4   Title Patient will have less than two days a week of high pain migraines and demonstrate ability to self control/contain migraines.    Baseline 9/1:every day getting the mild ones, 2x/week for high pain migraines. 11/15: two days of high pain in past month, has mini headaches every day 2/14: has worsening headaches in the evening but only 2-3 days of high pain 4/25: 2 days of high pain 10/31: 1-2 days a week 1/25: had 4 last week; but last month was averaging 1 per week. 5/9: 2 migraines in 3 weeks ; multiple weeks with no migraine. 8/1: migraines with storms but averaging major migraine 1x/month   Time 12    Period Weeks    Status Partially Met    Target Date  08/19/2023      PT LONG TERM GOAL #5   Title Patient will reduce Neck Disability Index score to <10% to demonstrate minimal disability with ADL's including improved sleeping tolerance, sitting tolerance, etc for better mobility at home and work.    Baseline 2/14: 18% 4/25: 16% 10/31: 16% 1/25: 14% 5/9: 13% 8/1: 14   Time 12    Period Weeks    Status Partially Met    Target Date 08/19/2023     PT LONG TERM GOAL #6  Title Patient will tolerate three hours in clinic with fluorescent lights with pain increase of <3/10 for improved  quality of life with work   Baseline 10/31: pain worsens with fluorescent lights. 1/25: 2 hours with pain increase 5/9; tolerates 3 hours   Time 12   Period Weeks   Status MET  Target Date 07/06/23         PT LONG TERM GOAL #7  Title Patient will improve the Headache Disability Index  to <10% or decreased disability and improved quality of life.   Baseline 5/9: 28%  8/1: 12%  Time 12   Period Weeks   Status Partially Met  Target Date 08/19/2023    PT LONG TERM GOAL #8  Title Patient will be able to attend gun range 1x/month without increase of pain >2/10 from baseline for return to sporting activities.   Baseline 8/1: immediate increase of pain   Time 12   Period Weeks   Status NEW  Target Date 08/19/2023        Plan     Clinical Impression Statement Continued address recurrent known triggerpoints about the neck and face. Transitioned to gentle moderate muscle loading to improve tissue loading tolerance during ADL/IADL postural requirements. No pain exacerbated in session. Some soreness in Rt UT near the SCJ, pt reports not of particular concern. She will benefit from skilled physical therapy to reduce pain, improve patient independence with symptom management, and improve quality of life    Personal Factors and Comorbidities Age;Behavior Pattern;Comorbidity 3+;Fitness;Past/Current Experience;Profession;Time since onset of  injury/illness/exacerbation    Comorbidities HLD, aortic atherosclerosis, asthma, COVID 19, SOB, hypothyroidism, atypical facial pain (2019), neuropathy (2020), fibromyalgia    Examination-Activity Limitations Caring for Others;Carry;Lift;Sleep;Transfers    Examination-Participation Restrictions Cleaning;Community Activity;Interpersonal Relationship;Driving;Laundry;Shop;Occupation;Meal Prep;Yard Work    Conservation officer, historic buildings Evolving/Moderate complexity    Rehab Potential Fair    PT Frequency Biweekly    PT Duration 12 weeks    PT Treatment/Interventions ADLs/Self Care Home Management;Dry needling;Manual techniques;Therapeutic exercise;Therapeutic activities;Neuromuscular re-education;Aquatic Therapy;Biofeedback;Canalith Repostioning;Cryotherapy;Electrical Stimulation;Iontophoresis 4mg /ml Dexamethasone;Moist Heat;Ultrasound;Patient/family education;Passive range of motion;Energy conservation;Vestibular;Joint Manipulations;Traction;Gait training;Balance training;Taping;Spinal Manipulations;Visual/perceptual remediation/compensation    PT Next Visit Plan Maintenance trigger point therapy, soft tissue mobilization    Consulted and Agree with Plan of Care Patient            2:34 PM, 07/06/23 Rosamaria Lints, PT, DPT Physical Therapist - [facility]  Outpatient Physical Therapy- Main [facility] [phone]     Kent Narrows C, PT 07/06/2023, 2:34 PM

## 2023-07-07 ENCOUNTER — Other Ambulatory Visit (HOSPITAL_COMMUNITY): Payer: Self-pay

## 2023-07-07 ENCOUNTER — Other Ambulatory Visit: Payer: Self-pay

## 2023-07-08 ENCOUNTER — Telehealth: Payer: Self-pay

## 2023-07-08 ENCOUNTER — Other Ambulatory Visit: Payer: Self-pay

## 2023-07-08 NOTE — Telephone Encounter (Signed)
PT scheduled for office visit with Vanga 09/06/2023

## 2023-07-08 NOTE — Telephone Encounter (Signed)
Gastroenterology Pre-Procedure Review  Request Date: TBD Requesting Physician: Dr. Jodelle Gross  PATIENT REVIEW QUESTIONS: The patient responded to the following health history questions as indicated:    1. Are you having any GI issues? yes (IBS-C currently taking Linzess would like an office visit prior to scheduling colonoscopy) 2. Do you have a personal history of Polyps? no 3. Do you have a family history of Colon Cancer or Polyps? no 4. Diabetes Mellitus? no 5. Joint replacements in the past 12 months?no 6. Major health problems in the past 3 months?no 7. Any artificial heart valves, MVP, or defibrillator?no    MEDICATIONS & ALLERGIES:    Patient reports the following regarding taking any anticoagulation/antiplatelet therapy:   Plavix, Coumadin, Eliquis, Xarelto, Lovenox, Pradaxa, Brilinta, or Effient? no Aspirin? no  Patient confirms/reports the following medications:  Current Outpatient Medications  Medication Sig Dispense Refill   albuterol (VENTOLIN HFA) 108 (90 Base) MCG/ACT inhaler Inhale 2 puffs into the lungs every 4 (four) hours as needed for wheezing or shortness of breath. 20.1 g 1   amphetamine-dextroamphetamine (ADDERALL) 5 MG tablet Take 1 tablet by mouth 2 (two) times daily. (Patient not taking: Reported on 05/18/2023)     atorvastatin (LIPITOR) 20 MG tablet Take 1 tablet (20 mg total) by mouth daily. 90 tablet 3   b complex vitamins capsule Take 1 capsule by mouth daily.     Bacillus Coagulans-Inulin (PROBIOTIC) 1-250 BILLION-MG CAPS Probiotic  Take 1 cap po daily     cetirizine (ZYRTEC) 10 MG tablet Take 1 tablet (10 mg total) by mouth every evening. 90 tablet 1   cloNIDine (CATAPRES) 0.1 MG tablet Take 1 tablet (0.1 mg total) by mouth 2 (two) times daily. 180 tablet 3   COVID-19 mRNA vaccine 2023-2024 (COMIRNATY) syringe Inject into the muscle. 0.3 mL 0   EPINEPHrine 0.3 mg/0.3 mL IJ SOAJ injection Inject 1 pen (0.3 mg) into the muscle as needed for anaphylaxis. 2 each 1    Erenumab-aooe (AIMOVIG) 140 MG/ML SOAJ Inject 140 mg as directed every 30 (thirty) days. 1 mL 0   ezetimibe (ZETIA) 10 MG tablet Take 1 tablet (10 mg total) by mouth daily. 90 tablet 3   famotidine (PEPCID) 20 MG tablet Take 20 mg by mouth 2 (two) times daily.     fluticasone (FLOVENT HFA) 110 MCG/ACT inhaler Inhale 2 puffs into the lungs in the morning and at bedtime. 12 g 5   frovatriptan (FROVA) 2.5 MG tablet Take 1 tablet (2.5 mg total) by mouth daily as needed for migraine 9 tablet 11   influenza vaccine (FLUCELVAX QUADRIVALENT) 0.5 ML injection Inject into the muscle. 0.5 mL 0   levothyroxine (EUTHYROX) 50 MCG tablet Take 1 tablet (50 mcg total) by mouth daily. 90 tablet 3   linaclotide (LINZESS) 145 MCG CAPS capsule Take 1 capsule (145 mcg total) by mouth daily. 30 capsule 5   meclizine (ANTIVERT) 25 MG tablet Take 1 tablet (25 mg total) by mouth 3 (three) times daily as needed. 90 tablet 1   meloxicam (MOBIC) 15 MG tablet Take 1 tablet (15 mg total) by mouth daily. 30 tablet 0   Menaquinone-7 (VITAMIN K2 PO) Take by mouth.     omalizumab Geoffry Paradise) 150 MG/ML prefilled syringe Inject 300 mg into the skin every 14 (fourteen) days. 4 mL 11   ondansetron (ZOFRAN) 8 MG tablet Take 1 tablet (8 mg total) by mouth 2 (two) times daily as needed. 20 tablet 0   Polyethyl Glycol-Propyl Glycol (SYSTANE OP) Place  1-2 drops into both eyes 2 (two) times daily.     Rimegepant Sulfate (NURTEC) 75 MG TBDP Dissolve 1 tablet by mouth daily as needed. 8 tablet 5   Spacer/Aero-Holding [facility] Use as directed with inhaler 1 each 0   spironolactone (ALDACTONE) 50 MG tablet Take 1 tablet (50 mg total) by mouth 2 (two) times daily. 180 tablet 3   torsemide (DEMADEX) 20 MG tablet Take 2 tablets (40 mg total) by mouth daily as needed. 180 tablet 1   triamcinolone (NASACORT) 55 MCG/ACT AERO nasal inhaler Place 2 sprays into the nose at bedtime.     Vitamin D, Ergocalciferol, (DRISDOL) 1.25 MG (50000 UNIT) CAPS  capsule Take 1 capsule (50,000 Units total) by mouth once a week. 12 capsule 3   zafirlukast (ACCOLATE) 10 MG tablet Take 1 tablet (10 mg total) by mouth 2 times daily. 180 tablet 1   zoledronic acid (RECLAST) 5 MG/100ML SOLN injection Inject 5 mg into the vein as directed. Once a year.     No current facility-administered medications for this visit.    Patient confirms/reports the following allergies:  Allergies  Allergen Reactions   Flonase [Fluticasone Propionate] Other (See Comments)    The scent in flonase causes wheezing    Latex     Wheezing   Fish Oil Itching   Percocet [Oxycodone-Acetaminophen] Other (See Comments)    Reports her pharmacist told her never to take this medication because of her codeine allergy   Sulfa Antibiotics     abdominal pain   Codeine Nausea And Vomiting and Rash    Tolerates tramadol     No orders of the defined types were placed in this encounter.   AUTHORIZATION INFORMATION Primary Insurance: 1D#: Group #:  Secondary Insurance: 1D#: Group #:  SCHEDULE INFORMATION: Date: TBD Time: Location: TBD

## 2023-07-09 ENCOUNTER — Other Ambulatory Visit (HOSPITAL_COMMUNITY): Payer: Self-pay

## 2023-07-09 MED ORDER — FLUTICASONE-SALMETEROL 100-50 MCG/ACT IN AEPB
1.0000 | INHALATION_SPRAY | Freq: Every morning | RESPIRATORY_TRACT | 2 refills | Status: DC
  Filled 2023-07-09: qty 60, 60d supply, fill #0

## 2023-07-09 NOTE — Addendum Note (Signed)
Addended by: Alfonse Spruce on: 07/09/2023 04:59 PM   Modules accepted: Orders

## 2023-07-12 ENCOUNTER — Other Ambulatory Visit (HOSPITAL_COMMUNITY): Payer: Self-pay

## 2023-07-12 ENCOUNTER — Other Ambulatory Visit: Payer: Self-pay

## 2023-07-12 ENCOUNTER — Encounter: Payer: Self-pay | Admitting: Pharmacist

## 2023-07-14 ENCOUNTER — Telehealth: Payer: Self-pay | Admitting: Gastroenterology

## 2023-07-14 NOTE — Telephone Encounter (Signed)
Referral coordinator called in to check the status of his referral.

## 2023-07-17 ENCOUNTER — Encounter (HOSPITAL_COMMUNITY): Payer: Self-pay

## 2023-07-19 ENCOUNTER — Ambulatory Visit: Payer: 59

## 2023-07-19 DIAGNOSIS — G8929 Other chronic pain: Secondary | ICD-10-CM | POA: Diagnosis not present

## 2023-07-19 DIAGNOSIS — M542 Cervicalgia: Secondary | ICD-10-CM | POA: Diagnosis not present

## 2023-07-19 DIAGNOSIS — R293 Abnormal posture: Secondary | ICD-10-CM

## 2023-07-19 DIAGNOSIS — M25511 Pain in right shoulder: Secondary | ICD-10-CM | POA: Diagnosis not present

## 2023-07-19 DIAGNOSIS — R519 Headache, unspecified: Secondary | ICD-10-CM | POA: Diagnosis not present

## 2023-07-19 NOTE — Therapy (Signed)
OUTPATIENT PHYSICAL THERAPY TREATMENT Patient Name: Rachel Fry MRN: [id number] DOB:16-Jun-1956, 67 y.o., female Today's Date: 07/20/2023  PCP: Andi Devon MD  REFERRING PROVIDER: Andi Devon MD    PT End of Session - 07/19/23 1451     Visit Number 49    Number of Visits 58    Date for PT Re-Evaluation 08/19/23    Authorization Type [employer]    Authorization Time Period 05/27/23-08/19/23    Progress Note Due on Visit 50    PT Start Time 1445    PT Stop Time 1528    PT Time Calculation (min) 43 min    Activity Tolerance Patient tolerated treatment well;No increased pain    Behavior During Therapy WFL for tasks assessed/performed            Past Medical History:  Diagnosis Date   Allergy    Asthma    COVID-19    History of kidney stones    Hypothyroidism    Migraines    Numbness and tingling    left side of body, occasionally right side    S/P Botox injection    Urticaria       REFERRING DIAG: Migraine  THERAPY DIAG:  Abnormal posture  Nonintractable headache, unspecified chronicity pattern, unspecified headache type  Cervicalgia  Chronic right shoulder pain  Rationale for Evaluation and Treatment Rehabilitation  PERTINENT HISTORY: Rachel Fry is a 67yoF who presents to physical therapy for chronic migraines/other associate headache and neck pain with specific referral for dry needling.  Patient has been having migraines for ~2005. They have changed from front temporal all day long 2-3/10, a couple years ago started having tingling in left side of body, numbness in arm and leg. Additionally sometimes get's belly aches.  Changed medication in 2020. Was getting trigger injections but it was too expensive. PMH of HLD, aortic atherosclerosis, asthma, COVID-19, SOB, hypothyroidism, atypical facial pain (2019), neuropathy (2020), fibromyalgia.   PRECAUTIONS: n/a  SUBJECTIVE Patient reports always having some discomfort but not bad at all  today. Reports compliant with postural strengthening HEP.   PAIN:  Are you having pain? Yes 2/10 Rt upper trap area    TODAY'S TREATMENT 07/20/23  THEREX: Postural         In supine -Supine with wand- into ER to stretch pectoral region out using PVC pipe- hold 30 sec x 3 -Horizontal shoulder Add/ABD with 2# dumbbell  x 12 - Static hold/rest with pool noodle under spine 3 min for prolonged pectoral stretch - Add chest press into scap retraction with 2# DB (while laying on pool noodle)  -standing bilat shrugs (scapular elevation)   In standing: Standing  Wall posture stretch-  hold posture at wall x 30 sec x 2 Chin retraction at wall x 10 reps x 2 sets Hold 3 sec Back against wall- W position ER hold with PVC pipe hold 10 sec x 5 Facing wall- "y' lift off x 10 reps  TDN Treatment: (Unbilled)  Patient consent: After explanation of TDN Rationale, Procedures, outcomes, and potential side effects, patient verbalized consent to TDN treatment. Region/Dx: posterior occipital region and B Upper trap pain Muscles Treated: Prong position -bilateral suboccipitals using 0.25 mm x 40 mm needles- 2 each side; Bilateral UT 0.33m x 30 mm x 2 needles each side Post treatment pain/response: No adverse reaction- 1 local twitch response on right UE  Post treatment Instructions: Patient instructed to expect mild to moderate muscle soreness this evening and tomorrow. Patient  instructed to continued prescribed home exercise program. Since dry needling over lung field, patient instructed of signs and symptoms of pneumothorax. Patient also educated on signs and symptoms of infection, however unlikely, and to seek immediate medical attention shoulder thy occur. Patient verbalized understanding of these instructions.    PATIENT EDUCATION: Education details: C0/C1 self mobilization  Person educated: Patient Education method: Explanation, Demonstration, Tactile cues, and Verbal cues Education comprehension:  verbalized understanding, returned demonstration, verbal cues required, and tactile cues required   HOME EXERCISE PROGRAM: *See prior sessions; no updates 07/20/23    PT Short Term Goals      PT SHORT TERM GOAL #1   Title Pt will be independent with HEP in order to decrease pain in order to improve pain-free function at home and work.    Baseline 6/7: HEP given 9/1: HEp compliant 11/15: HEP compliant    Time 6    Period Weeks    Status Achieved    Target Date 08/07/21              PT Long Term Goals -      PT LONG TERM GOAL #1   Title Patient will increase FOTO score to equal to or greater than 59%    to demonstrate statistically significant improvement in mobility and quality of life.    Baseline 6/7: 50% 9/1: 59% 11/15: 86% 2/14: 71%    Time 12    Period Weeks    Status Achieved      PT LONG TERM GOAL #2   Title Pt will decrease worst pain as reported on NPRS by at least 3 points (3/10) in order to demonstrate clinically significant reduction in pain.    Baseline 6/7: 6/10 9/1: 4/10 11/15: 5/10 2/14: 3-4/10 4/25: 6/10 10/31: 5/10 1/25: 4-5/10 5/9: 5/10 from illness but improving 8/1: 5/10    Time 12    Period Weeks    Status Partially Met    Target Date 08/19/2023      PT LONG TERM GOAL #3   Title Pt will demonstrate pain-free full range cervical motion in order to perform IADLs such as driving and household chores without increase in symptoms.    Baseline 6/7: see note for limitation 9/1:flexion 54, extension 60, SB R 35 L 30, rotation R 51 L 62 11/15: flexion 58, extension 60, SB R and L 35, R rotation 59 L rotation 60 2/14: flexion 70, extension 60 SB R and L 38, R rotation 59 L 64 4/25: see note 10/31: unable to find tool for ROM assessment, will perform next session.  1/25:look above 8/1: [facility]   Time 12    Period Weeks    Status MET   Target Date 05/27/2023      PT LONG TERM GOAL #4   Title Patient will have less than two days a week of high pain migraines and  demonstrate ability to self control/contain migraines.    Baseline 9/1:every day getting the mild ones, 2x/week for high pain migraines. 11/15: two days of high pain in past month, has mini headaches every day 2/14: has worsening headaches in the evening but only 2-3 days of high pain 4/25: 2 days of high pain 10/31: 1-2 days a week 1/25: had 4 last week; but last month was averaging 1 per week. 5/9: 2 migraines in 3 weeks ; multiple weeks with no migraine. 8/1: migraines with storms but averaging major migraine 1x/month   Time 12    Period Weeks  Status Partially Met    Target Date 08/19/2023      PT LONG TERM GOAL #5   Title Patient will reduce Neck Disability Index score to <10% to demonstrate minimal disability with ADL's including improved sleeping tolerance, sitting tolerance, etc for better mobility at home and work.    Baseline 2/14: 18% 4/25: 16% 10/31: 16% 1/25: 14% 5/9: 13% 8/1: 14   Time 12    Period Weeks    Status Partially Met    Target Date 08/19/2023     PT LONG TERM GOAL #6  Title Patient will tolerate three hours in clinic with fluorescent lights with pain increase of <3/10 for improved quality of life with work   Baseline 10/31: pain worsens with fluorescent lights. 1/25: 2 hours with pain increase 5/9; tolerates 3 hours   Time 12   Period Weeks   Status MET  Target Date 07/20/23         PT LONG TERM GOAL #7  Title Patient will improve the Headache Disability Index  to <10% or decreased disability and improved quality of life.   Baseline 5/9: 28%  8/1: 12%  Time 12   Period Weeks   Status Partially Met  Target Date 08/19/2023    PT LONG TERM GOAL #8  Title Patient will be able to attend gun range 1x/month without increase of pain >2/10 from baseline for return to sporting activities.   Baseline 8/1: immediate increase of pain   Time 12   Period Weeks   Status NEW  Target Date 08/19/2023        Plan     Clinical Impression Statement Continued  DN For taut bands and patient continues to respond well. She was able to progress her postural strengthening without any adversity today. Able to responded to all VC to perform activities and denied any pain. She will benefit from skilled physical therapy to reduce pain, improve patient independence with symptom management, and improve quality of life    Personal Factors and Comorbidities Age;Behavior Pattern;Comorbidity 3+;Fitness;Past/Current Experience;Profession;Time since onset of injury/illness/exacerbation    Comorbidities HLD, aortic atherosclerosis, asthma, COVID 19, SOB, hypothyroidism, atypical facial pain (2019), neuropathy (2020), fibromyalgia    Examination-Activity Limitations Caring for Others;Carry;Lift;Sleep;Transfers    Examination-Participation Restrictions Cleaning;Community Activity;Interpersonal Relationship;Driving;Laundry;Shop;Occupation;Meal Prep;Yard Work    Conservation officer, historic buildings Evolving/Moderate complexity    Rehab Potential Fair    PT Frequency Biweekly    PT Duration 12 weeks    PT Treatment/Interventions ADLs/Self Care Home Management;Dry needling;Manual techniques;Therapeutic exercise;Therapeutic activities;Neuromuscular re-education;Aquatic Therapy;Biofeedback;Canalith Repostioning;Cryotherapy;Electrical Stimulation;Iontophoresis 4mg /ml Dexamethasone;Moist Heat;Ultrasound;Patient/family education;Passive range of motion;Energy conservation;Vestibular;Joint Manipulations;Traction;Gait training;Balance training;Taping;Spinal Manipulations;Visual/perceptual remediation/compensation    PT Next Visit Plan Maintenance trigger point therapy, soft tissue mobilization    Consulted and Agree with Plan of Care Patient            1:39 PM, 07/20/23 Louis Meckel, PT Physical Therapist - [facility]  Outpatient Physical Therapy- [facility] [phone]     Lenda Kelp, PT 07/20/2023, 1:39 PM

## 2023-07-29 DIAGNOSIS — G43709 Chronic migraine without aura, not intractable, without status migrainosus: Secondary | ICD-10-CM | POA: Diagnosis not present

## 2023-07-31 ENCOUNTER — Other Ambulatory Visit (HOSPITAL_COMMUNITY): Payer: Self-pay

## 2023-08-02 ENCOUNTER — Other Ambulatory Visit (HOSPITAL_COMMUNITY): Payer: Self-pay

## 2023-08-02 ENCOUNTER — Other Ambulatory Visit: Payer: Self-pay

## 2023-08-02 MED ORDER — LINZESS 145 MCG PO CAPS
145.0000 ug | ORAL_CAPSULE | Freq: Every day | ORAL | 5 refills | Status: DC
  Filled 2023-08-02: qty 30, 30d supply, fill #0
  Filled 2023-09-01: qty 30, 30d supply, fill #1
  Filled 2023-09-29: qty 30, 30d supply, fill #2
  Filled 2023-10-26: qty 30, 30d supply, fill #3
  Filled 2023-12-06: qty 30, 30d supply, fill #4
  Filled 2023-12-31: qty 30, 30d supply, fill #5

## 2023-08-04 NOTE — Therapy (Signed)
OUTPATIENT PHYSICAL THERAPY TREATMENT/ Physical Therapy Progress Note   Dates of reporting period  03/04/23   to   08/05/23   Patient Name: Rachel Fry MRN: [id number] DOB:April 24, 1956, 67 y.o., female Today's Date: 08/05/2023  PCP: Andi Devon MD  REFERRING PROVIDER: Andi Devon MD    PT End of Session - 08/05/23 1530     Visit Number 50    Number of Visits 58    Date for PT Re-Evaluation 08/19/23    Authorization Type [employer]    Authorization Time Period 05/27/23-08/19/23    Progress Note Due on Visit 50    PT Start Time 1530    PT Stop Time 1614    PT Time Calculation (min) 44 min    Activity Tolerance Patient tolerated treatment well;No increased pain    Behavior During Therapy WFL for tasks assessed/performed             Past Medical History:  Diagnosis Date   Allergy    Asthma    COVID-19    History of kidney stones    Hypothyroidism    Migraines    Numbness and tingling    left side of body, occasionally right side    S/P Botox injection    Urticaria       REFERRING DIAG: Migraine  THERAPY DIAG:  Abnormal posture  Nonintractable headache, unspecified chronicity pattern, unspecified headache type  Cervicalgia  Chronic right shoulder pain  Rationale for Evaluation and Treatment Rehabilitation  PERTINENT HISTORY: Keanna Ledbetter is a 67yoF who presents to physical therapy for chronic migraines/other associate headache and neck pain with specific referral for dry needling.  Patient has been having migraines for ~2005. They have changed from front temporal all day long 2-3/10, a couple years ago started having tingling in left side of body, numbness in arm and leg. Additionally sometimes get's belly aches.  Changed medication in 2020. Was getting trigger injections but it was too expensive. PMH of HLD, aortic atherosclerosis, asthma, COVID-19, SOB, hypothyroidism, atypical facial pain (2019), neuropathy (2020), fibromyalgia.    PRECAUTIONS: n/a  SUBJECTIVE Patient reports she only took medicine once in the past month for her migraines.   PAIN:  Are you having pain? Yes 2/10 Rt upper trap area    TODAY'S TREATMENT 08/05/23 Goals performed for progress note:     Cervical side bend with overpressure to glenohumeral joint 2x 30 seconds Cervical rotation with overpressure to glenohumeral joint: 2x 30 seconds Suboccipital release 2x30 seconds J mobilization for postural kyphosis reduction 4x 30 seconds cervical and thoracic mobilizations grade II for reduced hypomobility x 6 minutes     TDN Treatment: (Unbilled)  Patient consent: After explanation of TDN Rationale, Procedures, outcomes, and potential side effects, patient verbalized consent to TDN treatment. Region/Dx: posterior occipital region and B Upper trap pain Muscles Treated: Prong position -bilateral suboccipitals using 0.25 mm x 40 mm needles- 2 each side; Bilateral UT 0.83m x 30 mm x 2 needles each side Post treatment pain/response: No adverse reaction- 1 local twitch response on right UE  Post treatment Instructions: Patient instructed to expect mild to moderate muscle soreness this evening and tomorrow. Patient instructed to continued prescribed home exercise program. Since dry needling over lung field, patient instructed of signs and symptoms of pneumothorax. Patient also educated on signs and symptoms of infection, however unlikely, and to seek immediate medical attention shoulder thy occur. Patient verbalized understanding of these instructions.    PATIENT EDUCATION: Education details: C0/C1  self mobilization  Person educated: Patient Education method: Explanation, Demonstration, Tactile cues, and Verbal cues Education comprehension: verbalized understanding, returned demonstration, verbal cues required, and tactile cues required   HOME EXERCISE PROGRAM: *See prior sessions; no updates 08/05/23    PT Short Term Goals      PT SHORT  TERM GOAL #1   Title Pt will be independent with HEP in order to decrease pain in order to improve pain-free function at home and work.    Baseline 6/7: HEP given 9/1: HEp compliant 11/15: HEP compliant    Time 6    Period Weeks    Status Achieved    Target Date 08/07/21              PT Long Term Goals -      PT LONG TERM GOAL #1   Title Patient will increase FOTO score to equal to or greater than 59%    to demonstrate statistically significant improvement in mobility and quality of life.    Baseline 6/7: 50% 9/1: 59% 11/15: 86% 2/14: 71%    Time 12    Period Weeks    Status Achieved      PT LONG TERM GOAL #2   Title Pt will decrease worst pain as reported on NPRS by at least 3 points (3/10) in order to demonstrate clinically significant reduction in pain.    Baseline 6/7: 6/10 9/1: 4/10 11/15: 5/10 2/14: 3-4/10 4/25: 6/10 10/31: 5/10 1/25: 4-5/10 5/9: 5/10 from illness but improving 8/1: 5/10 10/10: 4/10    Time 12    Period Weeks    Status Partially Met    Target Date 08/19/2023      PT LONG TERM GOAL #3   Title Pt will demonstrate pain-free full range cervical motion in order to perform IADLs such as driving and household chores without increase in symptoms.    Baseline 6/7: see note for limitation 9/1:flexion 54, extension 60, SB R 35 L 30, rotation R 51 L 62 11/15: flexion 58, extension 60, SB R and L 35, R rotation 59 L rotation 60 2/14: flexion 70, extension 60 SB R and L 38, R rotation 59 L 64 4/25: see note 10/31: unable to find tool for ROM assessment, will perform next session.  1/25:look above 8/1: [facility]   Time 12    Period Weeks    Status MET   Target Date 05/27/2023      PT LONG TERM GOAL #4   Title Patient will have less than two days a week of high pain migraines and demonstrate ability to self control/contain migraines.    Baseline 9/1:every day getting the mild ones, 2x/week for high pain migraines. 11/15: two days of high pain in past month, has mini  headaches every day 2/14: has worsening headaches in the evening but only 2-3 days of high pain 4/25: 2 days of high pain 10/31: 1-2 days a week 1/25: had 4 last week; but last month was averaging 1 per week. 5/9: 2 migraines in 3 weeks ; multiple weeks with no migraine. 8/1: migraines with storms but averaging major migraine 1x/month   Time 12    Period Weeks    Status Partially Met    Target Date 08/19/2023      PT LONG TERM GOAL #5   Title Patient will reduce Neck Disability Index score to <10% to demonstrate minimal disability with ADL's including improved sleeping tolerance, sitting tolerance, etc for better mobility at home and work.  Baseline 2/14: 18% 4/25: 16% 10/31: 16% 1/25: 14% 5/9: 13% 8/1: 14   Time 12    Period Weeks    Status Partially Met    Target Date 08/19/2023     PT LONG TERM GOAL #6  Title Patient will tolerate three hours in clinic with fluorescent lights with pain increase of <3/10 for improved quality of life with work   Baseline 10/31: pain worsens with fluorescent lights. 1/25: 2 hours with pain increase 5/9; tolerates 3 hours   Time 12   Period Weeks   Status MET  Target Date 08/05/23         PT LONG TERM GOAL #7  Title Patient will improve the Headache Disability Index  to <10% or decreased disability and improved quality of life.   Baseline 5/9: 28%  8/1: 12% 10/10: 2%  Time 12   Period Weeks   Status  Met  Target Date 08/19/2023    PT LONG TERM GOAL #8  Title Patient will be able to attend gun range 1x/month without increase of pain >2/10 from baseline for return to sporting activities.   Baseline 8/1: immediate increase of pain 10/10: unable to attempt recently   Time 12   Period Weeks   Status Ongoing   Target Date 08/19/2023        Plan     Clinical Impression Statement Patient met her HDI goal, she is having decreased pain and frequency of pain.  Patient's condition has the potential to improve in response to therapy. Maximum  improvement is yet to be obtained. The anticipated improvement is attainable and reasonable in a generally predictable time.  Marland Kitchen She will benefit from skilled physical therapy to reduce pain, improve patient independence with symptom management, and improve quality of life    Personal Factors and Comorbidities Age;Behavior Pattern;Comorbidity 3+;Fitness;Past/Current Experience;Profession;Time since onset of injury/illness/exacerbation    Comorbidities HLD, aortic atherosclerosis, asthma, COVID 19, SOB, hypothyroidism, atypical facial pain (2019), neuropathy (2020), fibromyalgia    Examination-Activity Limitations Caring for Others;Carry;Lift;Sleep;Transfers    Examination-Participation Restrictions Cleaning;Community Activity;Interpersonal Relationship;Driving;Laundry;Shop;Occupation;Meal Prep;Yard Work    Conservation officer, historic buildings Evolving/Moderate complexity    Rehab Potential Fair    PT Frequency Biweekly    PT Duration 12 weeks    PT Treatment/Interventions ADLs/Self Care Home Management;Dry needling;Manual techniques;Therapeutic exercise;Therapeutic activities;Neuromuscular re-education;Aquatic Therapy;Biofeedback;Canalith Repostioning;Cryotherapy;Electrical Stimulation;Iontophoresis 4mg /ml Dexamethasone;Moist Heat;Ultrasound;Patient/family education;Passive range of motion;Energy conservation;Vestibular;Joint Manipulations;Traction;Gait training;Balance training;Taping;Spinal Manipulations;Visual/perceptual remediation/compensation    PT Next Visit Plan Maintenance trigger point therapy, soft tissue mobilization    Consulted and Agree with Plan of Care Patient            4:21 PM, 08/05/23  Physical Therapist - [facility]  Outpatient Physical Therapy- Main Campus [phone]     Precious Bard, PT 08/05/2023, 4:21 PM

## 2023-08-05 ENCOUNTER — Ambulatory Visit: Payer: 59 | Attending: Internal Medicine

## 2023-08-05 DIAGNOSIS — M25511 Pain in right shoulder: Secondary | ICD-10-CM | POA: Insufficient documentation

## 2023-08-05 DIAGNOSIS — R293 Abnormal posture: Secondary | ICD-10-CM | POA: Insufficient documentation

## 2023-08-05 DIAGNOSIS — M542 Cervicalgia: Secondary | ICD-10-CM | POA: Insufficient documentation

## 2023-08-05 DIAGNOSIS — R519 Headache, unspecified: Secondary | ICD-10-CM | POA: Insufficient documentation

## 2023-08-05 DIAGNOSIS — G8929 Other chronic pain: Secondary | ICD-10-CM | POA: Insufficient documentation

## 2023-08-10 ENCOUNTER — Other Ambulatory Visit (HOSPITAL_COMMUNITY): Payer: Self-pay

## 2023-08-10 ENCOUNTER — Other Ambulatory Visit: Payer: Self-pay

## 2023-08-10 ENCOUNTER — Encounter (HOSPITAL_COMMUNITY): Payer: Self-pay

## 2023-08-10 NOTE — Progress Notes (Signed)
Specialty Pharmacy Ongoing Clinical Assessment Note  Rachel Fry is a 67 y.o. female who is being followed by the specialty pharmacy service for RxSp Allergy   Patient's specialty medication(s) reviewed today: Omalizumab   Missed doses in the last 4 weeks: 0   Patient/Caregiver did not have any additional questions or concerns.   Therapeutic benefit summary: Patient is achieving benefit   Adverse events/side effects summary: No adverse events/side effects   Patient's therapy is appropriate to: Continue    Goals Addressed             This Visit's Progress    Reduce signs and symptoms       Patient is on track. Patient will maintain adherence         Follow up:  6 months  Otto Herb Specialty Pharmacist

## 2023-08-10 NOTE — Progress Notes (Signed)
Specialty Pharmacy Refill Coordination Note  Rachel Fry is a 67 y.o. female contacted today regarding refills of specialty medication(s) Omalizumab   Patient requested Delivery   Delivery date: 08/12/23   Verified address: [address]   [name] [phone]   Medication will be filled on 08/11/23.

## 2023-08-17 ENCOUNTER — Other Ambulatory Visit (HOSPITAL_COMMUNITY): Payer: Self-pay

## 2023-08-18 ENCOUNTER — Other Ambulatory Visit: Payer: Self-pay

## 2023-08-23 ENCOUNTER — Other Ambulatory Visit: Payer: Self-pay

## 2023-08-24 ENCOUNTER — Ambulatory Visit
Admission: RE | Admit: 2023-08-24 | Discharge: 2023-08-24 | Disposition: A | Discharge status: Home / Self Care | Payer: 59 | Source: Ambulatory Visit | Attending: Physician Assistant | Admitting: Physician Assistant

## 2023-08-24 DIAGNOSIS — Z1231 Encounter for screening mammogram for malignant neoplasm of breast: Secondary | ICD-10-CM | POA: Insufficient documentation

## 2023-08-24 DIAGNOSIS — M818 Other osteoporosis without current pathological fracture: Secondary | ICD-10-CM

## 2023-09-01 ENCOUNTER — Other Ambulatory Visit (HOSPITAL_COMMUNITY): Payer: Self-pay

## 2023-09-01 ENCOUNTER — Other Ambulatory Visit: Payer: Self-pay

## 2023-09-01 ENCOUNTER — Other Ambulatory Visit: Payer: Self-pay | Admitting: Internal Medicine

## 2023-09-02 ENCOUNTER — Other Ambulatory Visit: Payer: Self-pay | Admitting: Allergy & Immunology

## 2023-09-02 ENCOUNTER — Other Ambulatory Visit: Payer: Self-pay

## 2023-09-02 ENCOUNTER — Other Ambulatory Visit: Payer: Self-pay | Admitting: Pharmacist

## 2023-09-02 ENCOUNTER — Other Ambulatory Visit (HOSPITAL_COMMUNITY): Payer: Self-pay

## 2023-09-02 ENCOUNTER — Other Ambulatory Visit (HOSPITAL_COMMUNITY): Payer: Self-pay | Admitting: Pharmacy Technician

## 2023-09-02 MED ORDER — OMALIZUMAB 150 MG/ML ~~LOC~~ SOSY
300.0000 mg | PREFILLED_SYRINGE | SUBCUTANEOUS | 11 refills | Status: DC
  Filled 2023-09-02: qty 4, 28d supply, fill #0
  Filled 2023-09-16: qty 2, 28d supply, fill #0

## 2023-09-02 MED ORDER — OMALIZUMAB 150 MG/ML ~~LOC~~ SOSY
300.0000 mg | PREFILLED_SYRINGE | SUBCUTANEOUS | 11 refills | Status: DC
  Filled ????-??-??: fill #0

## 2023-09-02 NOTE — Progress Notes (Signed)
Specialty Pharmacy Refill Coordination Note  Rachel Fry is a 67 y.o. female contacted today regarding refills of specialty medication(s) Omalizumab   Patient requested Delivery   Delivery date: 09/15/23   Verified address: [address]  [name]   Medication will be filled on 09/14/23.  Refill Request sent to MD; Call if any delays. Send to [facility] for re-write

## 2023-09-03 ENCOUNTER — Other Ambulatory Visit (HOSPITAL_COMMUNITY): Payer: Self-pay

## 2023-09-03 ENCOUNTER — Other Ambulatory Visit: Payer: Self-pay

## 2023-09-06 ENCOUNTER — Other Ambulatory Visit (HOSPITAL_COMMUNITY): Payer: Self-pay

## 2023-09-06 ENCOUNTER — Other Ambulatory Visit: Payer: Self-pay

## 2023-09-06 ENCOUNTER — Telehealth: Payer: Self-pay

## 2023-09-06 ENCOUNTER — Ambulatory Visit (INDEPENDENT_AMBULATORY_CARE_PROVIDER_SITE_OTHER): Payer: 59 | Admitting: Gastroenterology

## 2023-09-06 ENCOUNTER — Encounter: Payer: Self-pay | Admitting: Gastroenterology

## 2023-09-06 VITALS — BP 122/86 | HR 76 | Temp 97.9°F | Ht 65.0 in | Wt 167.0 lb

## 2023-09-06 DIAGNOSIS — Z1211 Encounter for screening for malignant neoplasm of colon: Secondary | ICD-10-CM | POA: Diagnosis not present

## 2023-09-06 DIAGNOSIS — Z8719 Personal history of other diseases of the digestive system: Secondary | ICD-10-CM

## 2023-09-06 MED ORDER — NA SULFATE-K SULFATE-MG SULF 17.5-3.13-1.6 GM/177ML PO SOLN
354.0000 mL | Freq: Once | ORAL | 0 refills | Status: AC
Start: 2023-09-06 — End: 2023-09-09
  Filled 2023-09-06: qty 354, 2d supply, fill #0

## 2023-09-06 NOTE — Telephone Encounter (Signed)
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 We reschedule to 11/15/2023. Sent her new instructions to mychart and gave them. Called Endo and talk to Alvy Beal is endo and she will get patient move to 11/15/23 with Dr. Allegra Lai

## 2023-09-06 NOTE — Progress Notes (Signed)
Arlyss Repress, MD [address]  [address]  [place], [place] [address]  Main: [phone]  Fax: [phone]    Gastroenterology Consultation  Referring Provider:     Andi Devon, MD Primary Care Physician:  Andi Devon, MD Primary Gastroenterologist:  Dr. Arlyss Repress Reason for Consultation: To discuss about screening colonoscopy        HPI:   Rachel Fry is a 67 y.o. female referred by Dr. Andi Devon, MD  for consultation & management of colon cancer screening.  Patient has history of IBS constipation, on Linzess 145 mcg daily which fairly regulates her symptoms.  She also has history of migraine which she is under control.  She had elevated transaminases in 11/2022 when she had COVID.  Her most recent LFTs from 03/2023 came back normal at her PCPs office. She does not have any concerns today.  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 Mechele Collin  Past Medical History:  Diagnosis Date   Allergy    Asthma    COVID-19    History of kidney stones    Hypothyroidism    Migraines    Numbness and tingling    left side of body, occasionally right side    S/P Botox injection    Urticaria     Past Surgical History:  Procedure Laterality Date   ADENOIDECTOMY     ARM NEUROPLASTY Right 1994 and 1996   for chronic pain   CESAREAN SECTION     CYSTOSCOPY W/ RETROGRADES Left 04/23/2020   Procedure: CYSTOSCOPY WITH RETROGRADE PYELOGRAM;  Surgeon: Riki Altes, MD;  Location: [facility] ORS;  Service: Urology;  Laterality: Left;   CYSTOSCOPY W/ RETROGRADES Left 05/13/2020   Procedure: CYSTOSCOPY WITH RETROGRADE PYELOGRAM;  Surgeon: Vanna Scotland, MD;  Location: [facility]  ORS;  Service: Urology;  Laterality: Left;   CYSTOSCOPY WITH STENT PLACEMENT Left 04/23/2020   Procedure: CYSTOSCOPY WITH STENT PLACEMENT;  Surgeon: Riki Altes, MD;  Location: [facility] ORS;  Service: Urology;  Laterality: Left;   CYSTOSCOPY/URETEROSCOPY/HOLMIUM LASER/STENT PLACEMENT Left 05/13/2020   Procedure: CYSTOSCOPY/URETEROSCOPY/LASER/STENT EXCHANGE;  Surgeon: Vanna Scotland, MD;  Location: [facility] ORS;  Service: Urology;  Laterality: Left;   OVARIAN CYST REMOVAL  1973   STONE EXTRACTION WITH BASKET  05/13/2020   Procedure: STONE EXTRACTION WITH BASKET;  Surgeon: Vanna Scotland, MD;  Location: [facility] ORS;  Service: Urology;;   TONSILLECTOMY     TONSILLECTOMY AND ADENOIDECTOMY     TUBAL LIGATION  1988     Current Outpatient Medications:    albuterol (VENTOLIN HFA) 108 (90 Base) MCG/ACT inhaler, Inhale 2 puffs into the lungs every 4 (four) hours as needed for wheezing or shortness of breath., Disp: 20.1 g, Rfl: 1   ascorbic acid (VITAMIN C) 250 MG CHEW, Chew 250 mg by mouth daily., Disp: , Rfl:    atorvastatin (LIPITOR) 20 MG tablet, Take 1 tablet (20 mg total) by mouth daily., Disp: 90 tablet, Rfl: 3   b complex vitamins capsule, Take 1 capsule by mouth daily., Disp: , Rfl:    Bacillus Coagulans-Inulin (PROBIOTIC) 1-250 BILLION-MG CAPS, Probiotic  Take 1 cap po daily, Disp: , Rfl:    cetirizine (ZYRTEC) 10 MG tablet, Take 1 tablet (  10 mg total) by mouth every evening., Disp: 90 tablet, Rfl: 1   cloNIDine (CATAPRES) 0.1 MG tablet, Take 1 tablet (0.1 mg total) by mouth 2 (two) times daily., Disp: 180 tablet, Rfl: 3   EPINEPHrine 0.3 mg/0.3 mL IJ SOAJ injection, Inject 1 pen (0.3 mg) into the muscle as needed for anaphylaxis., Disp: 2 each, Rfl: 1   ezetimibe (ZETIA) 10 MG tablet, Take 1 tablet (10 mg total) by mouth daily., Disp: 90 tablet, Rfl: 3   famotidine (PEPCID) 20 MG tablet, Take 20 mg by mouth 2 (two) times daily., Disp: , Rfl:    fluticasone (FLOVENT HFA) 110 MCG/ACT inhaler, Inhale  2 puffs into the lungs in the morning and at bedtime., Disp: 12 g, Rfl: 5   frovatriptan (FROVA) 2.5 MG tablet, Take 1 tablet (2.5 mg total) by mouth daily as needed for migraine, Disp: 9 tablet, Rfl: 11   levothyroxine (EUTHYROX) 50 MCG tablet, Take 1 tablet (50 mcg total) by mouth daily., Disp: 90 tablet, Rfl: 3   linaclotide (LINZESS) 145 MCG CAPS capsule, Take 1 capsule (145 mcg total) by mouth daily., Disp: 30 capsule, Rfl: 5   meclizine (ANTIVERT) 25 MG tablet, Take 1 tablet (25 mg total) by mouth 3 (three) times daily as needed., Disp: 90 tablet, Rfl: 1   meloxicam (MOBIC) 15 MG tablet, Take 1 tablet (15 mg total) by mouth daily., Disp: 30 tablet, Rfl: 0   Menaquinone-7 (VITAMIN K2 PO), Take by mouth., Disp: , Rfl:    Na Sulfate-K Sulfate-Mg Sulf 17.5-3.13-1.6 GM/177ML SOLN, Take 354 mLs by mouth once for 1 dose., Disp: 354 mL, Rfl: 0   omalizumab (XOLAIR) 150 MG/ML prefilled syringe, Inject 300 mg into the skin every 14 (fourteen) days., Disp: 4 mL, Rfl: 11   ondansetron (ZOFRAN-ODT) 8 MG disintegrating tablet, Take 8 mg by mouth every 8 (eight) hours as needed for refractory nausea / vomiting., Disp: , Rfl:    promethazine (PHENERGAN) 25 MG tablet, Take 1 tablet by mouth every 4 (four) hours as needed., Disp: , Rfl:    Rimegepant Sulfate (NURTEC) 75 MG TBDP, Dissolve 1 tablet by mouth daily as needed., Disp: 8 tablet, Rfl: 5   Spacer/Aero-Holding Chambers DEVI, Use as directed with inhaler, Disp: 1 each, Rfl: 0   spironolactone (ALDACTONE) 50 MG tablet, Take 1 tablet (50 mg total) by mouth 2 (two) times daily., Disp: 180 tablet, Rfl: 3   torsemide (DEMADEX) 20 MG tablet, Take 2 tablets (40 mg total) by mouth daily as needed., Disp: 180 tablet, Rfl: 1   triamcinolone (NASACORT) 55 MCG/ACT AERO nasal inhaler, Place 2 sprays into the nose at bedtime., Disp: , Rfl:    Vitamin D, Ergocalciferol, (DRISDOL) 1.25 MG (50000 UNIT) CAPS capsule, Take 1 capsule (50,000 Units total) by mouth once a  week., Disp: 12 capsule, Rfl: 3   zafirlukast (ACCOLATE) 10 MG tablet, Take 1 tablet (10 mg total) by mouth 2 times daily., Disp: 180 tablet, Rfl: 1   zoledronic acid (RECLAST) 5 MG/100ML SOLN injection, Inject 5 mg into the vein as directed. Once a year., Disp: , Rfl:    amphetamine-dextroamphetamine (ADDERALL) 5 MG tablet, Take 1 tablet by mouth 2 (two) times daily. (Patient not taking: Reported on 05/18/2023), Disp: , Rfl:    Erenumab-aooe (AIMOVIG) 140 MG/ML SOAJ, Inject 140 mg as directed every 30 (thirty) days. (Patient not taking: Reported on 09/06/2023), Disp: 1 mL, Rfl: 0   fluticasone-salmeterol (ADVAIR) 100-50 MCG/ACT AEPB, Inhale 1 puff into the lungs in  the morning. (Patient not taking: Reported on 09/06/2023), Disp: 60 each, Rfl: 2   Polyethyl Glycol-Propyl Glycol (SYSTANE OP), Place 1-2 drops into both eyes 2 (two) times daily., Disp: , Rfl:    Family History  Problem Relation Age of Onset   Hypertension Mother    Breast cancer Mother 7   Thyroid disease Mother    Stroke Mother    Dementia Mother    Cardiomyopathy Father    Peripheral Artery Disease Father    Hyperlipidemia Brother    Hypertension Brother    Hypertension Brother    Hyperlipidemia Brother    Thyroid disease Brother    Hypertension Brother    Birth defects Brother    Congenital heart disease Brother    Cancer Brother        Kidney cancer   Colon cancer Maternal Grandmother    Stroke Paternal Grandfather    Neuropathy Neg Hx    Multiple sclerosis Neg Hx    Migraines Neg Hx      Social History   Tobacco Use   Smoking status: Never   Smokeless tobacco: Never  Vaping Use   Vaping status: Never Used  Substance Use Topics   Alcohol use: No   Drug use: No    Allergies as of 09/06/2023 - Review Complete 09/06/2023  Allergen Reaction Noted   Flonase [fluticasone propionate] Other (See Comments)    Latex     Fish oil Itching 02/25/2015   Percocet [oxycodone-acetaminophen] Other (See Comments)  05/13/2020   Sulfa antibiotics  02/25/2015   Codeine Nausea And Vomiting and Rash 02/25/2015    Review of Systems:    All systems reviewed and negative except where noted in HPI.   Physical Exam:  BP 122/86 (BP Location: Right Arm, Patient Position: Sitting, Cuff Size: Normal)   Pulse 76   Temp 97.9 F (36.6 C) (Oral)   Ht 5\' 5"  (1.651 m)   Wt 167 lb (75.8 kg)   BMI 27.79 kg/m  No LMP recorded. Patient is postmenopausal.  General:   Alert,  Well-developed, well-nourished, pleasant and cooperative in NAD Head:  Normocephalic and atraumatic. Eyes:  Sclera clear, no icterus.   Conjunctiva pink. Ears:  Normal auditory acuity. Nose:  No deformity, discharge, or lesions. Mouth:  No deformity or lesions,oropharynx pink & moist. Neck:  Supple; no masses or thyromegaly. Lungs:  Respirations even and unlabored.  Clear throughout to auscultation.   No wheezes, crackles, or rhonchi. No acute distress. Heart:  Regular rate and rhythm; no murmurs, clicks, rubs, or gallops. Abdomen:  Normal bowel sounds. Soft, non-tender and non-distended without masses, hepatosplenomegaly or hernias noted.  No guarding or rebound tenderness.   Rectal: Not performed Msk:  Symmetrical without gross deformities. Good, equal movement & strength bilaterally. Pulses:  Normal pulses noted. Extremities:  No clubbing or edema.  No cyanosis. Neurologic:  Alert and oriented x3;  grossly normal neurologically. Skin:  Intact without significant lesions or rashes. No jaundice. Psych:  Alert and cooperative. Normal mood and affect.  Imaging Studies: Reviewed  Assessment and Plan:   AVAIYAH BELVEAL is a 67 y.o. pleasant Caucasian female with history of IBS-constipation, migraine, hypothyroidism is seen in consultation to discuss about screening colonoscopy.  Given her history of IBS constipation, discussed with patient regarding 2-day prep for the colonoscopy and she is agreeable  I have discussed alternative options,  risks & benefits,  which include, but are not limited to, bleeding, infection, perforation,respiratory complication & drug reaction.  The  patient agrees with this plan & written consent will be obtained.     Follow up as needed   Arlyss Repress, MD

## 2023-09-07 ENCOUNTER — Other Ambulatory Visit (HOSPITAL_COMMUNITY): Payer: Self-pay

## 2023-09-08 ENCOUNTER — Ambulatory Visit: Payer: 59 | Attending: Internal Medicine

## 2023-09-08 DIAGNOSIS — M542 Cervicalgia: Secondary | ICD-10-CM | POA: Diagnosis not present

## 2023-09-08 DIAGNOSIS — R519 Headache, unspecified: Secondary | ICD-10-CM | POA: Insufficient documentation

## 2023-09-08 DIAGNOSIS — R293 Abnormal posture: Secondary | ICD-10-CM | POA: Diagnosis not present

## 2023-09-08 NOTE — Therapy (Signed)
OUTPATIENT PHYSICAL THERAPY TREATMENT/RECERT   Patient Name: [name] MRN: [id number] DOB:07/25/1956, 67 y.o., female Today's Date: 09/08/2023  PCP: Andi Devon MD  REFERRING PROVIDER: Andi Devon MD    PT End of Session - 09/08/23 1359     Visit Number 51    Number of Visits 57    Date for PT Re-Evaluation 12/01/23    Authorization Type [employer]    Authorization Time Period 05/27/23-08/19/23    Progress Note Due on Visit 50    PT Start Time 1400    PT Stop Time 1444    PT Time Calculation (min) 44 min    Activity Tolerance Patient tolerated treatment well;No increased pain    Behavior During Therapy WFL for tasks assessed/performed              Past Medical History:  Diagnosis Date   Allergy    Asthma    COVID-19    History of kidney stones    Hypothyroidism    Migraines    Numbness and tingling    left side of body, occasionally right side    S/P Botox injection    Urticaria       REFERRING DIAG: Migraine  THERAPY DIAG:  Abnormal posture  Nonintractable headache, unspecified chronicity pattern, unspecified headache type  Cervicalgia  Rationale for Evaluation and Treatment Rehabilitation  PERTINENT HISTORY: Rachel Fry is a 67yoF who presents to physical therapy for chronic migraines/other associate headache and neck pain with specific referral for dry needling.  Patient has been having migraines for ~2005. They have changed from front temporal all day long 2-3/10, a couple years ago started having tingling in left side of body, numbness in arm and leg. Additionally sometimes get's belly aches.  Changed medication in 2020. Was getting trigger injections but it was too expensive. PMH of HLD, aortic atherosclerosis, asthma, COVID-19, SOB, hypothyroidism, atypical facial pain (2019), neuropathy (2020), fibromyalgia.   PRECAUTIONS: n/a  SUBJECTIVE Patient has not been seen due to increased work demands for past month.   PAIN:   Are you having pain? Yes 2/10 Rt upper trap area    TODAY'S TREATMENT 09/08/23 manual Cervical side bend with overpressure to glenohumeral joint 2x 30 seconds Cervical rotation with overpressure to glenohumeral joint: 2x 30 seconds Suboccipital release 2x30 seconds J mobilization for postural kyphosis reduction 4x 30 seconds cervical and thoracic mobilizations grade II for reduced hypomobility x 6 minutes   TherEx:  RTB overhead Y 15x RTB ER 15x Chin tuck 15x 3 second holds Posterior pelvic tilt 10x    TDN Treatment: (Unbilled)  Patient consent: After explanation of TDN Rationale, Procedures, outcomes, and potential side effects, patient verbalized consent to TDN treatment. Region/Dx: posterior occipital region and B Upper trap pain Muscles Treated: Prong position -bilateral suboccipitals using 0.25 mm x 40 mm needles- 2 each side; Bilateral UT 0.44m x 30 mm x 2 needles each side, bilateral cervical paraspinals .3x3  Post treatment pain/response: No adverse reaction- 1 local twitch response on right UE  Post treatment Instructions: Patient instructed to expect mild to moderate muscle soreness this evening and tomorrow. Patient instructed to continued prescribed home exercise program. Since dry needling over lung field, patient instructed of signs and symptoms of pneumothorax. Patient also educated on signs and symptoms of infection, however unlikely, and to seek immediate medical attention shoulder thy occur. Patient verbalized understanding of these instructions.    PATIENT EDUCATION: Education details: C0/C1 self mobilization  Person educated: Patient  Education method: Explanation, Demonstration, Tactile cues, and Verbal cues Education comprehension: verbalized understanding, returned demonstration, verbal cues required, and tactile cues required   HOME EXERCISE PROGRAM: *See prior sessions; no updates 09/08/23    PT Short Term Goals      PT SHORT TERM GOAL #1   Title Pt  will be independent with HEP in order to decrease pain in order to improve pain-free function at home and work.    Baseline 6/7: HEP given 9/1: HEp compliant 11/15: HEP compliant    Time 6    Period Weeks    Status Achieved    Target Date 08/07/21              PT Long Term Goals -      PT LONG TERM GOAL #1   Title Patient will increase FOTO score to equal to or greater than 59%    to demonstrate statistically significant improvement in mobility and quality of life.    Baseline 6/7: 50% 9/1: 59% 11/15: 86% 2/14: 71%    Time 12    Period Weeks    Status Achieved      PT LONG TERM GOAL #2   Title Pt will decrease worst pain as reported on NPRS by at least 3 points (3/10) in order to demonstrate clinically significant reduction in pain.    Baseline 6/7: 6/10 9/1: 4/10 11/15: 5/10 2/14: 3-4/10 4/25: 6/10 10/31: 5/10 1/25: 4-5/10 5/9: 5/10 from illness but improving 8/1: 5/10 10/10: 4/10    Time 12    Period Weeks    Status Partially Met    Target Date 12/01/2023        PT LONG TERM GOAL #3   Title Pt will demonstrate pain-free full range cervical motion in order to perform IADLs such as driving and household chores without increase in symptoms.    Baseline 6/7: see note for limitation 9/1:flexion 54, extension 60, SB R 35 L 30, rotation R 51 L 62 11/15: flexion 58, extension 60, SB R and L 35, R rotation 59 L rotation 60 2/14: flexion 70, extension 60 SB R and L 38, R rotation 59 L 64 4/25: see note 10/31: unable to find tool for ROM assessment, will perform next session.  1/25:look above 8/1: [facility]   Time 12    Period Weeks    Status MET   Target Date 05/27/2023      PT LONG TERM GOAL #4   Title Patient will have less than two days a week of high pain migraines and demonstrate ability to self control/contain migraines.    Baseline 9/1:every day getting the mild ones, 2x/week for high pain migraines. 11/15: two days of high pain in past month, has mini headaches every day 2/14: has  worsening headaches in the evening but only 2-3 days of high pain 4/25: 2 days of high pain 10/31: 1-2 days a week 1/25: had 4 last week; but last month was averaging 1 per week. 5/9: 2 migraines in 3 weeks ; multiple weeks with no migraine. 8/1: migraines with storms but averaging major migraine 1x/month   Time 12    Period Weeks    Status Partially Met    Target Date 12/01/2023        PT LONG TERM GOAL #5   Title Patient will reduce Neck Disability Index score to <10% to demonstrate minimal disability with ADL's including improved sleeping tolerance, sitting tolerance, etc for better mobility at home and work.  Baseline 2/14: 18% 4/25: 16% 10/31: 16% 1/25: 14% 5/9: 13% 8/1: 14   Time 12    Period Weeks    Status Partially Met    Target Date 12/01/2023       PT LONG TERM GOAL #6  Title Patient will tolerate three hours in clinic with fluorescent lights with pain increase of <3/10 for improved quality of life with work   Baseline 10/31: pain worsens with fluorescent lights. 1/25: 2 hours with pain increase 5/9; tolerates 3 hours   Time 12   Period Weeks   Status MET  Target Date 09/08/23         PT LONG TERM GOAL #7  Title Patient will improve the Headache Disability Index  to <10% or decreased disability and improved quality of life.   Baseline 5/9: 28%  8/1: 12% 10/10: 2%  Time 12   Period Weeks   Status  Met  Target Date 08/19/2023    PT LONG TERM GOAL #8  Title Patient will be able to attend gun range 1x/month without increase of pain >2/10 from baseline for return to sporting activities.   Baseline 8/1: immediate increase of pain 10/10: unable to attempt recently   Time 12   Period Weeks   Status Ongoing   Target Date 12/01/2023          Plan     Clinical Impression Statement Patient's goals performed last session ,see note 08/05/23 for details. Multiple large trigger points relieved during session and postural education performed. Postural strengthening and  correct alignment treatment tolerated well with no pain increase. She will benefit from skilled physical therapy to reduce pain, improve patient independence with symptom management, and improve quality of life    Personal Factors and Comorbidities Age;Behavior Pattern;Comorbidity 3+;Fitness;Past/Current Experience;Profession;Time since onset of injury/illness/exacerbation    Comorbidities HLD, aortic atherosclerosis, asthma, COVID 19, SOB, hypothyroidism, atypical facial pain (2019), neuropathy (2020), fibromyalgia    Examination-Activity Limitations Caring for Others;Carry;Lift;Sleep;Transfers    Examination-Participation Restrictions Cleaning;Community Activity;Interpersonal Relationship;Driving;Laundry;Shop;Occupation;Meal Prep;Yard Work    Conservation officer, historic buildings Evolving/Moderate complexity    Rehab Potential Fair    PT Frequency Biweekly    PT Duration 12 weeks    PT Treatment/Interventions ADLs/Self Care Home Management;Dry needling;Manual techniques;Therapeutic exercise;Therapeutic activities;Neuromuscular re-education;Aquatic Therapy;Biofeedback;Canalith Repostioning;Cryotherapy;Electrical Stimulation;Iontophoresis 4mg /ml Dexamethasone;Moist Heat;Ultrasound;Patient/family education;Passive range of motion;Energy conservation;Vestibular;Joint Manipulations;Traction;Gait training;Balance training;Taping;Spinal Manipulations;Visual/perceptual remediation/compensation    PT Next Visit Plan Maintenance trigger point therapy, soft tissue mobilization    Consulted and Agree with Plan of Care Patient            3:00 PM, 09/08/23  Physical Therapist - [facility]  Outpatient Physical Therapy- Main Campus [phone]     Precious Bard, PT 09/08/2023, 3:00 PM

## 2023-09-09 DIAGNOSIS — H2513 Age-related nuclear cataract, bilateral: Secondary | ICD-10-CM | POA: Diagnosis not present

## 2023-09-09 DIAGNOSIS — H35373 Puckering of macula, bilateral: Secondary | ICD-10-CM | POA: Diagnosis not present

## 2023-09-09 DIAGNOSIS — H2640 Unspecified secondary cataract: Secondary | ICD-10-CM | POA: Diagnosis not present

## 2023-09-09 DIAGNOSIS — H43813 Vitreous degeneration, bilateral: Secondary | ICD-10-CM | POA: Diagnosis not present

## 2023-09-13 ENCOUNTER — Other Ambulatory Visit: Payer: Self-pay

## 2023-09-13 ENCOUNTER — Encounter: Payer: Self-pay | Admitting: Allergy & Immunology

## 2023-09-14 ENCOUNTER — Other Ambulatory Visit (HOSPITAL_COMMUNITY): Payer: Self-pay

## 2023-09-14 NOTE — Progress Notes (Signed)
Per Tammy 2 week dosing denied & she will try to appeal. Every 4 week dose is approve Tammy will send a Rx possibly.

## 2023-09-16 ENCOUNTER — Other Ambulatory Visit (HOSPITAL_COMMUNITY): Payer: Self-pay

## 2023-09-16 ENCOUNTER — Other Ambulatory Visit: Payer: Self-pay | Admitting: *Deleted

## 2023-09-16 ENCOUNTER — Other Ambulatory Visit: Payer: Self-pay | Admitting: Pharmacist

## 2023-09-16 ENCOUNTER — Telehealth: Payer: Self-pay | Admitting: *Deleted

## 2023-09-16 ENCOUNTER — Other Ambulatory Visit: Payer: Self-pay

## 2023-09-16 MED ORDER — XOLAIR 300 MG/2ML ~~LOC~~ SOSY
300.0000 mg | PREFILLED_SYRINGE | SUBCUTANEOUS | 11 refills | Status: DC
  Filled 2023-09-16: qty 2, 28d supply, fill #0

## 2023-09-16 MED ORDER — XOLAIR 300 MG/2ML ~~LOC~~ SOSY
300.0000 mg | PREFILLED_SYRINGE | SUBCUTANEOUS | 11 refills | Status: DC
  Filled 2023-09-16: qty 2, 28d supply, fill #0
  Filled 2023-10-06: qty 2, 28d supply, fill #1
  Filled 2023-11-17: qty 2, 28d supply, fill #2
  Filled 2023-12-14: qty 2, 28d supply, fill #3
  Filled 2024-01-11: qty 2, 28d supply, fill #4
  Filled 2024-02-08: qty 2, 28d supply, fill #5
  Filled 2024-03-10: qty 2, 28d supply, fill #6

## 2023-09-16 NOTE — Progress Notes (Signed)
Specialty Pharmacy Refill Coordination Note  Rachel Fry is a 67 y.o. female contacted today regarding refills of specialty medication(s) Omalizumab   Patient requested Delivery   Delivery date: 09/21/23   Verified address: [address] [name] [id number]   Medication will be filled on 09/20/23. *Pending Prior Auth*- Patient is aware

## 2023-09-16 NOTE — Telephone Encounter (Signed)
Patient Ins has denied dosing for every 300mg  every 14 days with requirements stated below if you can send me articles I will appeal same

## 2023-09-21 ENCOUNTER — Other Ambulatory Visit: Payer: Self-pay

## 2023-09-22 NOTE — Telephone Encounter (Signed)
Waiting on Dr Dellis Anes to send 2 articles for appeal

## 2023-09-27 ENCOUNTER — Other Ambulatory Visit (HOSPITAL_COMMUNITY): Payer: Self-pay

## 2023-09-27 NOTE — Telephone Encounter (Signed)
I sent you a couple of articles.   Rachel Fry

## 2023-09-29 ENCOUNTER — Other Ambulatory Visit (HOSPITAL_COMMUNITY): Payer: Self-pay

## 2023-09-29 ENCOUNTER — Other Ambulatory Visit: Payer: Self-pay

## 2023-09-29 MED ORDER — SPIRONOLACTONE 50 MG PO TABS
50.0000 mg | ORAL_TABLET | Freq: Two times a day (BID) | ORAL | 3 refills | Status: DC
  Filled 2023-09-29: qty 180, 90d supply, fill #0
  Filled 2023-12-31: qty 180, 90d supply, fill #1

## 2023-09-30 ENCOUNTER — Ambulatory Visit: Payer: 59

## 2023-09-30 ENCOUNTER — Other Ambulatory Visit: Payer: Self-pay

## 2023-09-30 NOTE — Therapy (Incomplete)
OUTPATIENT PHYSICAL THERAPY TREATMENT   Patient Name: Rachel Fry MRN: [id number] DOB:30-Mar-1956, 67 y.o., female Today's Date: 09/30/2023  PCP: Andi Devon MD  REFERRING PROVIDER: Andi Devon MD       Past Medical History:  Diagnosis Date   Allergy    Asthma    COVID-19    History of kidney stones    Hypothyroidism    Migraines    Numbness and tingling    left side of body, occasionally right side    S/P Botox injection    Urticaria       REFERRING DIAG: Migraine  THERAPY DIAG:  No diagnosis found.  Rationale for Evaluation and Treatment Rehabilitation  PERTINENT HISTORY: Rachel Fry is a 67yoF who presents to physical therapy for chronic migraines/other associate headache and neck pain with specific referral for dry needling.  Patient has been having migraines for ~2005. They have changed from front temporal all day long 2-3/10, a couple years ago started having tingling in left side of body, numbness in arm and leg. Additionally sometimes get's belly aches.  Changed medication in 2020. Was getting trigger injections but it was too expensive. PMH of HLD, aortic atherosclerosis, asthma, COVID-19, SOB, hypothyroidism, atypical facial pain (2019), neuropathy (2020), fibromyalgia.   PRECAUTIONS: n/a  SUBJECTIVE : ***Patient has not been seen due to increased work demands for past month.   PAIN:  Are you having pain? Yes 2/10 Rt upper trap area    TODAY'S TREATMENT 09/30/23 manual Cervical side bend with overpressure to glenohumeral joint 2x 30 seconds Cervical rotation with overpressure to glenohumeral joint: 2x 30 seconds Suboccipital release 2x30 seconds J mobilization for postural kyphosis reduction 4x 30 seconds cervical and thoracic mobilizations grade II for reduced hypomobility x 6 minutes   TherEx:  RTB overhead Y 15x RTB ER 15x Chin tuck 15x 3 second holds Posterior pelvic tilt 10x    TDN Treatment: (Unbilled)  Patient consent: After  explanation of TDN Rationale, Procedures, outcomes, and potential side effects, patient verbalized consent to TDN treatment. Region/Dx: posterior occipital region and B Upper trap pain Muscles Treated: Prong position -bilateral suboccipitals using 0.25 mm x 40 mm needles- 2 each side; Bilateral UT 0.41m x 30 mm x 2 needles each side, bilateral cervical paraspinals .3x3  Post treatment pain/response: No adverse reaction- 1 local twitch response on right UE  Post treatment Instructions: Patient instructed to expect mild to moderate muscle soreness this evening and tomorrow. Patient instructed to continued prescribed home exercise program. Since dry needling over lung field, patient instructed of signs and symptoms of pneumothorax. Patient also educated on signs and symptoms of infection, however unlikely, and to seek immediate medical attention shoulder thy occur. Patient verbalized understanding of these instructions.    PATIENT EDUCATION: Education details: C0/C1 self mobilization  Person educated: Patient Education method: Explanation, Demonstration, Tactile cues, and Verbal cues Education comprehension: verbalized understanding, returned demonstration, verbal cues required, and tactile cues required   HOME EXERCISE PROGRAM: *See prior sessions; no updates 09/30/23    PT Short Term Goals      PT SHORT TERM GOAL #1   Title Pt will be independent with HEP in order to decrease pain in order to improve pain-free function at home and work.    Baseline 6/7: HEP given 9/1: HEp compliant 11/15: HEP compliant    Time 6    Period Weeks    Status Achieved    Target Date 08/07/21  PT Long Term Goals -      PT LONG TERM GOAL #1   Title Patient will increase FOTO score to equal to or greater than 59%    to demonstrate statistically significant improvement in mobility and quality of life.    Baseline 6/7: 50% 9/1: 59% 11/15: 86% 2/14: 71%    Time 12    Period Weeks    Status  Achieved      PT LONG TERM GOAL #2   Title Pt will decrease worst pain as reported on NPRS by at least 3 points (3/10) in order to demonstrate clinically significant reduction in pain.    Baseline 6/7: 6/10 9/1: 4/10 11/15: 5/10 2/14: 3-4/10 4/25: 6/10 10/31: 5/10 1/25: 4-5/10 5/9: 5/10 from illness but improving 8/1: 5/10 10/10: 4/10    Time 12    Period Weeks    Status Partially Met    Target Date 12/01/2023        PT LONG TERM GOAL #3   Title Pt will demonstrate pain-free full range cervical motion in order to perform IADLs such as driving and household chores without increase in symptoms.    Baseline 6/7: see note for limitation 9/1:flexion 54, extension 60, SB R 35 L 30, rotation R 51 L 62 11/15: flexion 58, extension 60, SB R and L 35, R rotation 59 L rotation 60 2/14: flexion 70, extension 60 SB R and L 38, R rotation 59 L 64 4/25: see note 10/31: unable to find tool for ROM assessment, will perform next session.  1/25:look above 8/1: [facility]   Time 12    Period Weeks    Status MET   Target Date 05/27/2023      PT LONG TERM GOAL #4   Title Patient will have less than two days a week of high pain migraines and demonstrate ability to self control/contain migraines.    Baseline 9/1:every day getting the mild ones, 2x/week for high pain migraines. 11/15: two days of high pain in past month, has mini headaches every day 2/14: has worsening headaches in the evening but only 2-3 days of high pain 4/25: 2 days of high pain 10/31: 1-2 days a week 1/25: had 4 last week; but last month was averaging 1 per week. 5/9: 2 migraines in 3 weeks ; multiple weeks with no migraine. 8/1: migraines with storms but averaging major migraine 1x/month   Time 12    Period Weeks    Status Partially Met    Target Date 12/01/2023        PT LONG TERM GOAL #5   Title Patient will reduce Neck Disability Index score to <10% to demonstrate minimal disability with ADL's including improved sleeping tolerance, sitting  tolerance, etc for better mobility at home and work.    Baseline 2/14: 18% 4/25: 16% 10/31: 16% 1/25: 14% 5/9: 13% 8/1: 14   Time 12    Period Weeks    Status Partially Met    Target Date 12/01/2023       PT LONG TERM GOAL #6  Title Patient will tolerate three hours in clinic with fluorescent lights with pain increase of <3/10 for improved quality of life with work   Baseline 10/31: pain worsens with fluorescent lights. 1/25: 2 hours with pain increase 5/9; tolerates 3 hours   Time 12   Period Weeks   Status MET  Target Date 09/30/23         PT LONG TERM GOAL #7  Title Patient  will improve the Headache Disability Index  to <10% or decreased disability and improved quality of life.   Baseline 5/9: 28%  8/1: 12% 10/10: 2%  Time 12   Period Weeks   Status  Met  Target Date 08/19/2023    PT LONG TERM GOAL #8  Title Patient will be able to attend gun range 1x/month without increase of pain >2/10 from baseline for return to sporting activities.   Baseline 8/1: immediate increase of pain 10/10: unable to attempt recently   Time 12   Period Weeks   Status Ongoing   Target Date 12/01/2023          Plan     Clinical Impression Statement Patient's goals performed last session ,see note 08/05/23 for details. Multiple large trigger points relieved during session and postural education performed. Postural strengthening and correct alignment treatment tolerated well with no pain increase. She will benefit from skilled physical therapy to reduce pain, improve patient independence with symptom management, and improve quality of life    Personal Factors and Comorbidities Age;Behavior Pattern;Comorbidity 3+;Fitness;Past/Current Experience;Profession;Time since onset of injury/illness/exacerbation    Comorbidities HLD, aortic atherosclerosis, asthma, COVID 19, SOB, hypothyroidism, atypical facial pain (2019), neuropathy (2020), fibromyalgia    Examination-Activity Limitations Caring for  Others;Carry;Lift;Sleep;Transfers    Examination-Participation Restrictions Cleaning;Community Activity;Interpersonal Relationship;Driving;Laundry;Shop;Occupation;Meal Prep;Yard Work    Conservation officer, historic buildings Evolving/Moderate complexity    Rehab Potential Fair    PT Frequency Biweekly    PT Duration 12 weeks    PT Treatment/Interventions ADLs/Self Care Home Management;Dry needling;Manual techniques;Therapeutic exercise;Therapeutic activities;Neuromuscular re-education;Aquatic Therapy;Biofeedback;Canalith Repostioning;Cryotherapy;Electrical Stimulation;Iontophoresis 4mg /ml Dexamethasone;Moist Heat;Ultrasound;Patient/family education;Passive range of motion;Energy conservation;Vestibular;Joint Manipulations;Traction;Gait training;Balance training;Taping;Spinal Manipulations;Visual/perceptual remediation/compensation    PT Next Visit Plan Maintenance trigger point therapy, soft tissue mobilization    Consulted and Agree with Plan of Care Patient            11:14 AM, 09/30/23  Physical Therapist - [facility]  Outpatient Physical Therapy- Main Campus [phone]     Lenda Kelp, PT 09/30/2023, 11:14 AM

## 2023-10-04 DIAGNOSIS — H2640 Unspecified secondary cataract: Secondary | ICD-10-CM | POA: Diagnosis not present

## 2023-10-04 DIAGNOSIS — H43813 Vitreous degeneration, bilateral: Secondary | ICD-10-CM | POA: Diagnosis not present

## 2023-10-05 NOTE — Telephone Encounter (Signed)
Submitted articles and update info to Ins

## 2023-10-06 ENCOUNTER — Other Ambulatory Visit: Payer: Self-pay

## 2023-10-06 NOTE — Progress Notes (Signed)
Specialty Pharmacy Refill Coordination Note  Rachel Fry is a 67 y.o. female contacted today regarding refills of specialty medication(s) Omalizumab   Patient requested Delivery   Delivery date: 10/29/23   Verified address: [address] [name] [id number]   Medication will be filled on 10/28/23.

## 2023-10-26 ENCOUNTER — Other Ambulatory Visit: Payer: Self-pay

## 2023-10-28 ENCOUNTER — Other Ambulatory Visit: Payer: Self-pay

## 2023-10-29 DIAGNOSIS — G43709 Chronic migraine without aura, not intractable, without status migrainosus: Secondary | ICD-10-CM | POA: Diagnosis not present

## 2023-10-31 NOTE — Telephone Encounter (Signed)
 [employer] denied appeal for increased quantity with the articles submitted

## 2023-11-01 DIAGNOSIS — G43709 Chronic migraine without aura, not intractable, without status migrainosus: Secondary | ICD-10-CM | POA: Diagnosis not present

## 2023-11-02 NOTE — Telephone Encounter (Signed)
 She is mostly doing it for the itching/hives. Do you think she would be a good candidate for Nemluvio?   Malachi Bonds, MD [facility]

## 2023-11-03 NOTE — Telephone Encounter (Signed)
 Not sure just advise if you want me to try for approval of same

## 2023-11-03 NOTE — Telephone Encounter (Signed)
 Lets try that.  The rep said it would be covered even if insurance denied it.

## 2023-11-04 ENCOUNTER — Other Ambulatory Visit: Payer: Self-pay

## 2023-11-04 ENCOUNTER — Other Ambulatory Visit (HOSPITAL_COMMUNITY): Payer: Self-pay

## 2023-11-04 MED ORDER — FROVATRIPTAN SUCCINATE 2.5 MG PO TABS
2.5000 mg | ORAL_TABLET | Freq: Every day | ORAL | 11 refills | Status: AC | PRN
  Filled 2023-11-04: qty 9, 30d supply, fill #0
  Filled 2023-12-06 – 2024-04-06 (×2): qty 9, 30d supply, fill #1
  Filled 2024-06-09: qty 9, 30d supply, fill #2
  Filled 2024-10-09: qty 9, 30d supply, fill #3

## 2023-11-04 MED ORDER — NURTEC 75 MG PO TBDP
ORAL_TABLET | ORAL | 11 refills | Status: DC
  Filled 2023-11-04: qty 8, 30d supply, fill #0
  Filled 2023-12-06: qty 8, 30d supply, fill #1
  Filled 2024-01-22: qty 8, 30d supply, fill #2

## 2023-11-05 ENCOUNTER — Other Ambulatory Visit: Payer: Self-pay

## 2023-11-05 ENCOUNTER — Encounter: Payer: Self-pay | Admitting: Pharmacist

## 2023-11-05 ENCOUNTER — Other Ambulatory Visit (HOSPITAL_COMMUNITY): Payer: Self-pay

## 2023-11-08 ENCOUNTER — Encounter: Payer: Self-pay | Admitting: Gastroenterology

## 2023-11-11 ENCOUNTER — Telehealth: Payer: Self-pay

## 2023-11-11 NOTE — Telephone Encounter (Signed)
Patient lvm to inquire if she had picked up the right size Miralax prep. She found it written on the bottle after her call but requested a call back to make sure it was right.  Call returned informed her that she did pick up the right size bottle-238 grams.  Thanks,  [place], [place]

## 2023-11-15 ENCOUNTER — Ambulatory Visit: Payer: 59 | Admitting: Anesthesiology

## 2023-11-15 ENCOUNTER — Encounter
Admission: RE | Disposition: A | Discharge status: Home / Self Care | Payer: Self-pay | Source: Home / Self Care | Attending: Gastroenterology

## 2023-11-15 ENCOUNTER — Ambulatory Visit
Admission: RE | Admit: 2023-11-15 | Discharge: 2023-11-15 | Disposition: A | Discharge status: Home / Self Care | Payer: 59 | Attending: Gastroenterology | Admitting: Gastroenterology

## 2023-11-15 DIAGNOSIS — K219 Gastro-esophageal reflux disease without esophagitis: Secondary | ICD-10-CM | POA: Diagnosis not present

## 2023-11-15 DIAGNOSIS — D122 Benign neoplasm of ascending colon: Secondary | ICD-10-CM | POA: Diagnosis not present

## 2023-11-15 DIAGNOSIS — Z79899 Other long term (current) drug therapy: Secondary | ICD-10-CM | POA: Insufficient documentation

## 2023-11-15 DIAGNOSIS — E039 Hypothyroidism, unspecified: Secondary | ICD-10-CM | POA: Insufficient documentation

## 2023-11-15 DIAGNOSIS — K621 Rectal polyp: Secondary | ICD-10-CM | POA: Diagnosis not present

## 2023-11-15 DIAGNOSIS — Z8249 Family history of ischemic heart disease and other diseases of the circulatory system: Secondary | ICD-10-CM | POA: Diagnosis not present

## 2023-11-15 DIAGNOSIS — K635 Polyp of colon: Secondary | ICD-10-CM

## 2023-11-15 DIAGNOSIS — J45909 Unspecified asthma, uncomplicated: Secondary | ICD-10-CM | POA: Diagnosis not present

## 2023-11-15 DIAGNOSIS — D124 Benign neoplasm of descending colon: Secondary | ICD-10-CM | POA: Diagnosis not present

## 2023-11-15 DIAGNOSIS — I10 Essential (primary) hypertension: Secondary | ICD-10-CM | POA: Diagnosis not present

## 2023-11-15 DIAGNOSIS — Z1211 Encounter for screening for malignant neoplasm of colon: Secondary | ICD-10-CM

## 2023-11-15 HISTORY — PX: POLYPECTOMY: SHX5525

## 2023-11-15 HISTORY — PX: COLONOSCOPY WITH PROPOFOL: SHX5780

## 2023-11-15 SURGERY — COLONOSCOPY WITH PROPOFOL
Anesthesia: General

## 2023-11-15 MED ORDER — SODIUM CHLORIDE 0.9 % IV SOLN
INTRAVENOUS | Status: DC
  Administered 2023-11-15: 20 mL/h via INTRAVENOUS

## 2023-11-15 MED ORDER — PROPOFOL 10 MG/ML IV BOLUS
INTRAVENOUS | Status: DC | PRN
  Administered 2023-11-15: 70 mg via INTRAVENOUS

## 2023-11-15 MED ORDER — PROPOFOL 500 MG/50ML IV EMUL
INTRAVENOUS | Status: DC | PRN
  Administered 2023-11-15: 140 ug/kg/min via INTRAVENOUS

## 2023-11-15 NOTE — H&P (Signed)
Rachel Repress, MD [address]  [address]  [place], [place] [address]  Main: [phone]  Fax: [phone] Pager: [phone]  Primary Care Physician:  Andi Devon, MD Primary Gastroenterologist:  Dr. Arlyss Fry  Pre-Procedure History & Physical: HPI:  Rachel Fry is a 68 y.o. female is here for an colonoscopy.   Past Medical History:  Diagnosis Date   Allergy    Asthma    COVID-19    History of kidney stones    Hypothyroidism    Migraines    Numbness and tingling    left side of body, occasionally right side    S/P Botox injection    Urticaria     Past Surgical History:  Procedure Laterality Date   ADENOIDECTOMY     ARM NEUROPLASTY Right 1994 and 1996   for chronic pain   CESAREAN SECTION     CYSTOSCOPY W/ RETROGRADES Left 04/23/2020   Procedure: CYSTOSCOPY WITH RETROGRADE PYELOGRAM;  Surgeon: Riki Altes, MD;  Location: [facility];  Service: Urology;  Laterality: Left;   CYSTOSCOPY W/ RETROGRADES Left 05/13/2020   Procedure: CYSTOSCOPY WITH RETROGRADE PYELOGRAM;  Surgeon: Vanna Scotland, MD;  Location: [facility];  Service: Urology;  Laterality: Left;   CYSTOSCOPY WITH STENT PLACEMENT Left 04/23/2020   Procedure: CYSTOSCOPY WITH STENT PLACEMENT;  Surgeon: Riki Altes, MD;  Location: [facility];  Service: Urology;  Laterality: Left;   CYSTOSCOPY/URETEROSCOPY/HOLMIUM LASER/STENT PLACEMENT Left 05/13/2020   Procedure: CYSTOSCOPY/URETEROSCOPY/LASER/STENT EXCHANGE;  Surgeon: Vanna Scotland, MD;  Location: [facility];  Service: Urology;  Laterality: Left;   OVARIAN CYST REMOVAL  1973   STONE EXTRACTION WITH BASKET  05/13/2020   Procedure: STONE EXTRACTION WITH BASKET;  Surgeon: Vanna Scotland, MD;  Location: [facility];  Service: Urology;;   TONSILLECTOMY     TONSILLECTOMY AND ADENOIDECTOMY     TUBAL LIGATION  1988    Prior to Admission medications   Medication Sig Start Date End Date Taking? Authorizing Provider  albuterol (VENTOLIN HFA) 108 (90  Base) MCG/ACT inhaler Inhale 2 puffs into the lungs every 4 (four) hours as needed for wheezing or shortness of breath. 11/17/22  Yes Alfonse Spruce, MD  ascorbic acid (VITAMIN C) 250 MG CHEW Chew 250 mg by mouth daily.   Yes [provider]  atorvastatin (LIPITOR) 20 MG tablet Take 1 tablet (20 mg total) by mouth daily. 01/19/23  Yes   b complex vitamins capsule Take 1 capsule by mouth daily.   Yes [provider]  Bacillus Coagulans-Inulin (PROBIOTIC) 1-250 BILLION-MG CAPS Probiotic  Take 1 cap po daily   Yes [provider]  cetirizine (ZYRTEC) 10 MG tablet Take 1 tablet (10 mg total) by mouth every evening. 11/17/22  Yes Alfonse Spruce, MD  cloNIDine (CATAPRES) 0.1 MG tablet Take 1 tablet (0.1 mg total) by mouth 2 (two) times daily. 06/16/23  Yes   EPINEPHrine 0.3 mg/0.3 mL IJ SOAJ injection Inject 1 pen (0.3 mg) into the muscle as needed for anaphylaxis. 07/02/23  Yes Alfonse Spruce, MD  ezetimibe (ZETIA) 10 MG tablet Take 1 tablet (10 mg total) by mouth daily. 12/13/22  Yes   famotidine (PEPCID) 20 MG tablet Take 20 mg by mouth 2 (two) times daily.   Yes [provider]  fluticasone (FLOVENT HFA) 110 MCG/ACT inhaler Inhale 2 puffs into the lungs in the morning and at bedtime. 05/18/23  Yes Alfonse Spruce, MD  frovatriptan (FROVA) 2.5 MG tablet Take 1 tablet (2.5 mg  total) by mouth daily as needed for migraine 06/01/22  Yes   frovatriptan (FROVA) 2.5 MG tablet Take 1 tablet (2.5 mg total) by mouth daily as needed for migraine. 11/01/23  Yes   levothyroxine (EUTHYROX) 50 MCG tablet Take 1 tablet (50 mcg total) by mouth daily. 04/05/23  Yes   linaclotide (LINZESS) 145 MCG CAPS capsule Take 1 capsule (145 mcg total) by mouth daily. 08/02/23  Yes   meclizine (ANTIVERT) 25 MG tablet Take 1 tablet (25 mg total) by mouth 3 (three) times daily as needed. 12/10/22  Yes   meloxicam (MOBIC) 15 MG tablet Take 1 tablet (15 mg total) by mouth daily. 01/06/23   Yes   Menaquinone-7 (VITAMIN K2 PO) Take by mouth.   Yes [provider]  NURTEC 75 MG TBDP Take 1 tablet (75 mg total) by mouth as directed (at migraine onset) 11/01/23  Yes   omalizumab (XOLAIR) 300 MG/2  ML prefilled syringe Inject 300 mg into the skin every 28 (twenty-eight) days. 09/16/23  Yes Quentin Angst, MD  ondansetron (ZOFRAN-ODT) 8 MG disintegrating tablet Take 8 mg by mouth every 8 (eight) hours as needed for refractory nausea / vomiting.   Yes [provider]  Polyethyl Glycol-Propyl Glycol (SYSTANE OP) Place 1-2 drops into both eyes 2 (two) times daily.   Yes [provider]  promethazine (PHENERGAN) 25 MG tablet Take 1 tablet by mouth every 4 (four) hours as needed. 12/21/22  Yes [provider]  Rimegepant Sulfate (NURTEC) 75 MG TBDP Dissolve 1 tablet by mouth daily as needed. 06/01/22  Yes   Spacer/Aero-Holding Rudean Curt Use as directed with inhaler 01/01/23  Yes Alfonse Spruce, MD  spironolactone (ALDACTONE) 50 MG tablet Take 1 tablet (50 mg total) by mouth 2 (two) times daily. 09/29/23  Yes   torsemide (DEMADEX) 20 MG tablet Take 2 tablets (40 mg total) by mouth daily as needed. 12/10/22  Yes   triamcinolone (NASACORT) 55 MCG/ACT AERO nasal inhaler Place 2 sprays into the nose at bedtime.   Yes [provider]  Vitamin D, Ergocalciferol, (DRISDOL) 1.25 MG (50000 UNIT) CAPS capsule Take 1 capsule (50,000 Units total) by mouth once a week. 01/11/23  Yes   zafirlukast (ACCOLATE) 10 MG tablet Take 1 tablet (10 mg total) by mouth 2 times daily. 05/18/23  Yes Alfonse Spruce, MD  zoledronic acid (RECLAST) 5 MG/100ML SOLN injection Inject 5 mg into the vein as directed. Once a year.   Yes [provider]  amphetamine-dextroamphetamine (ADDERALL) 5 MG tablet Take 1 tablet by mouth 2 (two) times daily. Patient not taking: Reported on 05/18/2023 12/22/21   [provider]  Erenumab-aooe (AIMOVIG) 140 MG/ML SOAJ  Inject 140 mg as directed every 30 (thirty) days. Patient not taking: Reported on 09/06/2023 01/19/22     fluticasone-salmeterol (ADVAIR) 100-50 MCG/ACT AEPB Inhale 1 puff into the lungs in the morning. Patient not taking: Reported on 09/06/2023 07/09/23   Alfonse Spruce, MD    Allergies as of 09/06/2023 - Review Complete 09/06/2023  Allergen Reaction Noted   Flonase [fluticasone propionate] Other (See Comments)    Latex     Fish oil Itching 02/25/2015   Percocet [oxycodone-acetaminophen] Other (See Comments) 05/13/2020   Sulfa antibiotics  02/25/2015   Codeine Nausea And Vomiting and Rash 02/25/2015    Family History  Problem Relation Age of Onset   Hypertension Mother    Breast cancer Mother 39   Thyroid disease Mother    Stroke Mother  Dementia Mother    Cardiomyopathy Father    Peripheral Artery Disease Father    Hyperlipidemia Brother    Hypertension Brother    Hypertension Brother    Hyperlipidemia Brother    Thyroid disease Brother    Hypertension Brother    Birth defects Brother    Congenital heart disease Brother    Cancer Brother        Kidney cancer   Colon cancer Maternal Grandmother    Stroke Paternal Grandfather    Neuropathy Neg Hx    Multiple sclerosis Neg Hx    Migraines Neg Hx     Social History   Socioeconomic History   Marital status: Married    Spouse name: Hessie Diener   Number of children: 3   Years of education: 16   Highest education level: Not on file  Occupational History   Occupation: [employer] re[employer]  Tobacco Use   Smoking status: Never   Smokeless tobacco: Never  Vaping Use   Vaping status: Never Used  Substance and Sexual Activity   Alcohol use: No   Drug use: No   Sexual activity: Not on file  Other Topics Concern   Not on file  Social History Narrative   Lives with husband   Caffeine use: none   Left handed   Social Drivers of Corporate investment banker Strain: Not on file  Food Insecurity: No Food Insecurity  (11/18/2021)   Received from [facility], [facility]   Hunger Vital Sign    Worried About Running Out of Food in the Last Year: Never true    Ran Out of Food in the Last Year: Never true  Transportation Needs: Not on file  Physical Activity: Not on file  Stress: Not on file  Social Connections: Unknown (02/25/2022)   Received from [facility]   Social Network    Social Network: Not on file  Intimate Partner Violence: Unknown (01/29/2022)   Received from [facility], [facility]   HITS    Physically Hurt: Not on file    Insult or Talk Down To: Not on file    Threaten Physical Harm: Not on file    Scream or Curse: Not on file    Review of Systems: See HPI, otherwise negative ROS  Physical Exam: BP (!) 179/81   Pulse 89   Temp 97.6 F (36.4 C) (Temporal)   Resp 20   Ht 5\' 5"  (1.651 m)   Wt 73 kg   SpO2 95%   BMI 26.79 kg/m  General:   Alert,  pleasant and cooperative in NAD Head:  Normocephalic and atraumatic. Neck:  Supple; no masses or thyromegaly. Lungs:  Clear throughout to auscultation.    Heart:  Regular rate and rhythm. Abdomen:  Soft, nontender and nondistended. Normal bowel sounds, without guarding, and without rebound.   Neurologic:  Alert and  oriented x4;  grossly normal neurologically.  Impression/Plan: AZERIA ALEO is here for an colonoscopy to be performed for colon cancer screening  Risks, benefits, limitations, and alternatives regarding  colonoscopy have been reviewed with the patient.  Questions have been answered.  All parties agreeable.   Lannette Donath, MD  11/15/2023, 8:51 AM

## 2023-11-15 NOTE — Op Note (Signed)
[facility] Gastroenterology Patient Name: Rachel Fry Procedure Date: 11/15/2023 9:00 AM MRN: [id number] Account #: [id number] Date of Birth: [birth year] Admit Type: Outpatient Age: 68 Room: [facility] ENDO ROOM 4 Gender: Female Note Status: Finalized Instrument Name: Nelda Marseille [id number] Procedure:             Colonoscopy Indications:           Screening for colorectal malignant neoplasm, Last                         colonoscopy: August 2013 Providers:             Toney Reil MD, MD Referring MD:          Merlene Laughter. Renae Gloss, MD (Referring MD) Medicines:             General Anesthesia Complications:         No immediate complications. Estimated blood loss: None. Procedure:             Pre-Anesthesia Assessment:                        - Prior to the procedure, a History and Physical was                         performed, and patient medications and allergies were                         reviewed. The patient is competent. The risks and                         benefits of the procedure and the sedation options and                         risks were discussed with the patient. All questions                         were answered and informed consent was obtained.                         Patient identification and proposed procedure were                         verified by the physician, the nurse, the                         anesthesiologist, the anesthetist and the technician                         in the pre-procedure area in the procedure room in the                         endoscopy suite. Mental Status Examination: alert and                         oriented. Airway Examination: normal oropharyngeal                         airway and neck mobility. Respiratory Examination:  clear to auscultation. CV Examination: normal.                         Prophylactic Antibiotics: The patient does not require                         prophylactic  antibiotics. Prior Anticoagulants: The                         patient has taken no anticoagulant or antiplatelet                         agents. ASA Grade Assessment: II - A patient with mild                         systemic disease. After reviewing the risks and                         benefits, the patient was deemed in satisfactory                         condition to undergo the procedure. The anesthesia                         plan was to use general anesthesia. Immediately prior                         to administration of medications, the patient was                         re-assessed for adequacy to receive sedatives. The                         heart rate, respiratory rate, oxygen saturations,                         blood pressure, adequacy of pulmonary ventilation, and                         response to care were monitored throughout the                         procedure. The physical status of the patient was                         re-assessed after the procedure.                        After obtaining informed consent, the colonoscope was                         passed under direct vision. Throughout the procedure,                         the patient's blood pressure, pulse, and oxygen                         saturations were monitored continuously. The  Colonoscope was introduced through the anus and                         advanced to the the cecum, identified by appendiceal                         orifice and ileocecal valve. The colonoscopy was                         performed without difficulty. The patient tolerated                         the procedure well. The quality of the bowel                         preparation was evaluated using the BBPS [facility] Bowel                         Preparation Scale) with scores of: Right Colon = 3,                         Transverse Colon = 3 and Left Colon = 3 (entire mucosa                         seen  well with no residual staining, small fragments                         of stool or opaque liquid). The total BBPS score                         equals 9. The ileocecal valve, appendiceal orifice,                         and rectum were photographed. Findings:      The perianal and digital rectal examinations were normal. Pertinent       negatives include normal sphincter tone and no palpable rectal lesions.      A 6 mm polyp was found in the ascending colon. The polyp was sessile.       The polyp was removed with a cold snare. Resection and retrieval were       complete.      Two sessile polyps were found in the distal rectum and descending colon.       The polyps were diminutive in size. These polyps were removed with a       jumbo cold forceps. Resection and retrieval were complete.      The retroflexed view of the distal rectum and anal verge was normal and       showed no anal or rectal abnormalities. Impression:            - One 6 mm polyp in the ascending colon, removed with                         a cold snare. Resected and retrieved.                        - Two diminutive polyps in the distal rectum and in  the descending colon, removed with a jumbo cold                         forceps. Resected and retrieved.                        - The distal rectum and anal verge are normal on                         retroflexion view. Recommendation:        - Discharge patient to home (with escort).                        - Resume previous diet today.                        - Continue present medications.                        - Await pathology results.                        - Repeat colonoscopy in 5-10 years for surveillance                         based on pathology results. Procedure Code(s):     --- Professional ---                        [phone], Colonoscopy, flexible; with removal of                         tumor(s), polyp(s), or other lesion(s) by snare                          technique                        45380, 59, Colonoscopy, flexible; with biopsy, single                         or multiple Diagnosis Code(s):     --- Professional ---                        Z12.11, Encounter for screening for malignant neoplasm                         of colon                        D12.2, Benign neoplasm of ascending colon                        D12.4, Benign neoplasm of descending colon                        D12.8, Benign neoplasm of rectum CPT copyright 2022 American Medical Association. All rights reserved. The codes documented in this report are preliminary and upon coder review may  be revised to meet current compliance requirements. Dr. Libby Maw Toney Reil MD, MD 11/15/2023 9:25:49 AM This report has been signed electronically.  Number of Addenda: 0 Note Initiated On: 11/15/2023 9:00 AM Scope Withdrawal Time: 0 hours 7 minutes 56 seconds  Total Procedure Duration: 0 hours 14 minutes 39 seconds  Estimated Blood Loss:  Estimated blood loss: none.      [facility]

## 2023-11-15 NOTE — Transfer of Care (Signed)
Immediate Anesthesia Transfer of Care Note  Patient: Rachel Fry  Procedure(s) Performed: COLONOSCOPY WITH PROPOFOL POLYPECTOMY  Patient Location: PACU  Anesthesia Type:General  Level of Consciousness: awake, alert , and oriented  Airway & Oxygen Therapy: Patient Spontanous Breathing and Patient connected to nasal cannula oxygen  Post-op Assessment: Report given to RN and Post -op Vital signs reviewed and stable  Post vital signs: Reviewed and stable  Last Vitals:  Vitals Value Taken Time  BP 139/73 11/15/23 0927  Temp 36.1 C 11/15/23 0927  Pulse 80 11/15/23 0928  Resp 19 11/15/23 0928  SpO2 100 % 11/15/23 0928  Vitals shown include unfiled device data.  Last Pain:  Vitals:   11/15/23 0927  TempSrc: Temporal  PainSc: Asleep         Complications: No notable events documented.

## 2023-11-15 NOTE — Anesthesia Preprocedure Evaluation (Signed)
Anesthesia Evaluation  Patient identified by MRN, date of birth, ID band Patient awake    Reviewed: Allergy & Precautions, NPO status , Patient's Chart, lab work & pertinent test results  Airway Mallampati: III  TM Distance: >3 FB Neck ROM: Full    Dental  (+) Teeth Intact   Pulmonary neg pulmonary ROS, asthma    Pulmonary exam normal breath sounds clear to auscultation       Cardiovascular Exercise Tolerance: Good hypertension, Pt. on medications negative cardio ROS Normal cardiovascular exam Rhythm:Regular Rate:Normal     Neuro/Psych  Headaches  Anxiety     negative neurological ROS  negative psych ROS   GI/Hepatic negative GI ROS, Neg liver ROS,GERD  Medicated,,(+) Hepatitis -  Endo/Other  negative endocrine ROSHypothyroidism    Renal/GU negative Renal ROS  negative genitourinary   Musculoskeletal   Abdominal Normal abdominal exam  (+)   Peds negative pediatric ROS (+)  Hematology negative hematology ROS (+)   Anesthesia Other Findings Past Medical History: No date: Allergy No date: Asthma No date: COVID-19 No date: History of kidney stones No date: Hypothyroidism No date: Migraines No date: Numbness and tingling     Comment:  left side of body, occasionally right side  No date: S/P Botox injection No date: Urticaria  Past Surgical History: No date: ADENOIDECTOMY 43 and 1996: ARM NEUROPLASTY; Right     Comment:  for chronic pain No date: CESAREAN SECTION 04/23/2020: CYSTOSCOPY W/ RETROGRADES; Left     Comment:  Procedure: CYSTOSCOPY WITH RETROGRADE PYELOGRAM;                Surgeon: Riki Altes, MD;  Location: [facility];                Service: Urology;  Laterality: Left; 05/13/2020: CYSTOSCOPY W/ RETROGRADES; Left     Comment:  Procedure: CYSTOSCOPY WITH RETROGRADE PYELOGRAM;                Surgeon: Vanna Scotland, MD;  Location: [facility];                Service: Urology;  Laterality:  Left; 04/23/2020: CYSTOSCOPY WITH STENT PLACEMENT; Left     Comment:  Procedure: CYSTOSCOPY WITH STENT PLACEMENT;  Surgeon:               Riki Altes, MD;  Location: [facility];  Service:               Urology;  Laterality: Left; 05/13/2020: CYSTOSCOPY/URETEROSCOPY/HOLMIUM LASER/STENT PLACEMENT; Left     Comment:  Procedure: CYSTOSCOPY/URETEROSCOPY/LASER/STENT EXCHANGE;              Surgeon: Vanna Scotland, MD;  Location: [facility];                Service: Urology;  Laterality: Left; 1973: OVARIAN CYST REMOVAL 05/13/2020: STONE EXTRACTION WITH BASKET     Comment:  Procedure: STONE EXTRACTION WITH BASKET;  Surgeon:               Vanna Scotland, MD;  Location: [facility];  Service:               Urology;; No date: TONSILLECTOMY No date: TONSILLECTOMY AND ADENOIDECTOMY 1988: TUBAL LIGATION  BMI    Body Mass Index: 26.79 kg/m      Reproductive/Obstetrics negative OB ROS  Anesthesia Physical Anesthesia Plan  ASA: 2  Anesthesia Plan: General   Post-op Pain Management:    Induction: Intravenous  PONV Risk Score and Plan: Propofol infusion and TIVA  Airway Management Planned: Natural Airway and Nasal Cannula  Additional Equipment:   Intra-op Plan:   Post-operative Plan:   Informed Consent: I have reviewed the patients History and Physical, chart, labs and discussed the procedure including the risks, benefits and alternatives for the proposed anesthesia with the patient or authorized representative who has indicated his/her understanding and acceptance.     Dental Advisory Given  Plan Discussed with: CRNA and Surgeon  Anesthesia Plan Comments:        Anesthesia Quick Evaluation

## 2023-11-15 NOTE — Anesthesia Postprocedure Evaluation (Signed)
Anesthesia Post Note  Patient: Rachel Fry  Procedure(s) Performed: COLONOSCOPY WITH PROPOFOL POLYPECTOMY  Patient location during evaluation: PACU Anesthesia Type: General Level of consciousness: awake and awake and alert Pain management: satisfactory to patient Vital Signs Assessment: post-procedure vital signs reviewed and stable Respiratory status: spontaneous breathing and nonlabored ventilation Cardiovascular status: blood pressure returned to baseline Anesthetic complications: no   No notable events documented.   Last Vitals:  Vitals:   11/15/23 0937 11/15/23 0947  BP: 136/77 (!) 145/82  Pulse:    Resp:    Temp:    SpO2:      Last Pain:  Vitals:   11/15/23 0947  TempSrc:   PainSc: 0-No pain                 VAN STAVEREN,Athina Fahey

## 2023-11-16 ENCOUNTER — Encounter: Payer: Self-pay | Admitting: Gastroenterology

## 2023-11-16 LAB — SURGICAL PATHOLOGY

## 2023-11-17 ENCOUNTER — Other Ambulatory Visit (HOSPITAL_COMMUNITY): Payer: Self-pay | Admitting: Pharmacy Technician

## 2023-11-17 ENCOUNTER — Other Ambulatory Visit (HOSPITAL_COMMUNITY): Payer: Self-pay

## 2023-11-17 NOTE — Progress Notes (Signed)
Specialty Pharmacy Refill Coordination Note  Rachel Fry is a 68 y.o. female contacted today regarding refills of specialty medication(s) Omalizumab (Xolair)   Patient requested Delivery   Delivery date: 11/23/23   Verified address: Patient address [address]  [name]   Medication will be filled on 11/22/23.

## 2023-11-18 ENCOUNTER — Encounter: Payer: Self-pay | Admitting: Gastroenterology

## 2023-11-22 ENCOUNTER — Other Ambulatory Visit: Payer: Self-pay

## 2023-11-29 ENCOUNTER — Other Ambulatory Visit: Payer: Self-pay

## 2023-12-06 ENCOUNTER — Other Ambulatory Visit (HOSPITAL_COMMUNITY): Payer: Self-pay

## 2023-12-07 ENCOUNTER — Other Ambulatory Visit: Payer: Self-pay

## 2023-12-07 ENCOUNTER — Other Ambulatory Visit (HOSPITAL_COMMUNITY): Payer: Self-pay

## 2023-12-07 ENCOUNTER — Other Ambulatory Visit (HOSPITAL_BASED_OUTPATIENT_CLINIC_OR_DEPARTMENT_OTHER): Payer: Self-pay

## 2023-12-07 MED ORDER — EZETIMIBE 10 MG PO TABS
10.0000 mg | ORAL_TABLET | Freq: Every day | ORAL | 3 refills | Status: DC
  Filled 2023-12-07: qty 90, 90d supply, fill #0
  Filled 2024-03-05: qty 90, 90d supply, fill #1

## 2023-12-07 MED ORDER — METHYLPREDNISOLONE 4 MG PO TBPK
4.0000 mg | ORAL_TABLET | ORAL | 0 refills | Status: DC
  Filled 2023-12-07: qty 21, 6d supply, fill #0

## 2023-12-08 ENCOUNTER — Other Ambulatory Visit: Payer: Self-pay

## 2023-12-09 ENCOUNTER — Other Ambulatory Visit: Payer: Self-pay

## 2023-12-14 ENCOUNTER — Other Ambulatory Visit: Payer: Self-pay

## 2023-12-14 NOTE — Progress Notes (Signed)
Specialty Pharmacy Refill Coordination Note  Rachel Fry is a 68 y.o. female contacted today regarding refills of specialty medication(s) Omalizumab (Xolair)   Patient requested Delivery   Delivery date: 12/21/23   Verified address: [address]   [name] [address]   Medication will be filled on 12/20/23.

## 2023-12-21 ENCOUNTER — Encounter: Payer: Self-pay | Admitting: Allergy & Immunology

## 2023-12-31 ENCOUNTER — Other Ambulatory Visit (HOSPITAL_COMMUNITY): Payer: Self-pay

## 2023-12-31 ENCOUNTER — Other Ambulatory Visit: Payer: Self-pay | Admitting: Allergy & Immunology

## 2023-12-31 ENCOUNTER — Other Ambulatory Visit: Payer: Self-pay

## 2023-12-31 MED ORDER — ALBUTEROL SULFATE HFA 108 (90 BASE) MCG/ACT IN AERS
2.0000 | INHALATION_SPRAY | RESPIRATORY_TRACT | 1 refills | Status: DC | PRN
  Filled 2023-12-31: qty 20.1, 48d supply, fill #0

## 2024-01-03 ENCOUNTER — Other Ambulatory Visit (HOSPITAL_COMMUNITY): Payer: Self-pay

## 2024-01-11 ENCOUNTER — Other Ambulatory Visit: Payer: Self-pay

## 2024-01-11 NOTE — Progress Notes (Signed)
 Specialty Pharmacy Refill Coordination Note  Rachel Fry is a 67 y.o. female contacted today regarding refills of specialty medication(s) Omalizumab (Xolair)   Patient requested (Patient-Rptd) Delivery   Delivery date: (Patient-Rptd) 01/19/24   Verified address: (Patient-Rptd) [address]  [place],  [place] [address]   Medication will be filled on 03.25.25.

## 2024-01-18 ENCOUNTER — Other Ambulatory Visit: Payer: Self-pay

## 2024-01-22 ENCOUNTER — Other Ambulatory Visit: Payer: Self-pay | Admitting: Allergy & Immunology

## 2024-01-22 ENCOUNTER — Other Ambulatory Visit (HOSPITAL_COMMUNITY): Payer: Self-pay

## 2024-01-24 ENCOUNTER — Other Ambulatory Visit (HOSPITAL_COMMUNITY): Payer: Self-pay

## 2024-01-24 ENCOUNTER — Other Ambulatory Visit: Payer: Self-pay

## 2024-01-24 MED ORDER — ATORVASTATIN CALCIUM 20 MG PO TABS
20.0000 mg | ORAL_TABLET | Freq: Every day | ORAL | 3 refills | Status: DC
  Filled 2024-01-24: qty 90, 90d supply, fill #0

## 2024-01-26 ENCOUNTER — Other Ambulatory Visit: Payer: Self-pay | Admitting: Allergy & Immunology

## 2024-01-26 ENCOUNTER — Other Ambulatory Visit: Payer: Self-pay

## 2024-01-28 DIAGNOSIS — G43709 Chronic migraine without aura, not intractable, without status migrainosus: Secondary | ICD-10-CM | POA: Diagnosis not present

## 2024-02-01 ENCOUNTER — Other Ambulatory Visit (HOSPITAL_COMMUNITY): Payer: Self-pay

## 2024-02-01 ENCOUNTER — Other Ambulatory Visit: Payer: Self-pay

## 2024-02-01 ENCOUNTER — Other Ambulatory Visit: Payer: Self-pay | Admitting: Allergy & Immunology

## 2024-02-01 MED ORDER — LINZESS 145 MCG PO CAPS
145.0000 ug | ORAL_CAPSULE | Freq: Every day | ORAL | 5 refills | Status: DC
Start: 2024-02-01 — End: 2024-04-04
  Filled 2024-02-01: qty 30, 30d supply, fill #0
  Filled 2024-03-05: qty 30, 30d supply, fill #1

## 2024-02-02 ENCOUNTER — Other Ambulatory Visit: Payer: Self-pay

## 2024-02-07 ENCOUNTER — Encounter: Payer: Self-pay | Admitting: Allergy & Immunology

## 2024-02-08 ENCOUNTER — Other Ambulatory Visit (HOSPITAL_COMMUNITY): Payer: Self-pay

## 2024-02-08 ENCOUNTER — Encounter: Payer: Self-pay | Admitting: Internal Medicine

## 2024-02-08 ENCOUNTER — Ambulatory Visit (INDEPENDENT_AMBULATORY_CARE_PROVIDER_SITE_OTHER): Admitting: Internal Medicine

## 2024-02-08 ENCOUNTER — Other Ambulatory Visit: Payer: Self-pay

## 2024-02-08 VITALS — BP 160/88 | HR 83 | Temp 97.3°F | Resp 16 | Ht 65.0 in | Wt 165.9 lb

## 2024-02-08 DIAGNOSIS — L501 Idiopathic urticaria: Secondary | ICD-10-CM

## 2024-02-08 DIAGNOSIS — J310 Chronic rhinitis: Secondary | ICD-10-CM | POA: Diagnosis not present

## 2024-02-08 DIAGNOSIS — J4540 Moderate persistent asthma, uncomplicated: Secondary | ICD-10-CM | POA: Diagnosis not present

## 2024-02-08 MED ORDER — CETIRIZINE HCL 10 MG PO TABS
10.0000 mg | ORAL_TABLET | Freq: Every evening | ORAL | 1 refills | Status: DC
  Filled 2024-02-08: qty 90, 90d supply, fill #0
  Filled 2024-05-05: qty 90, 90d supply, fill #1

## 2024-02-08 MED ORDER — ALBUTEROL SULFATE HFA 108 (90 BASE) MCG/ACT IN AERS
2.0000 | INHALATION_SPRAY | RESPIRATORY_TRACT | 1 refills | Status: AC | PRN
  Filled 2024-02-08: qty 20.1, 48d supply, fill #0
  Filled 2024-04-05: qty 20.1, 48d supply, fill #1

## 2024-02-08 MED ORDER — ZAFIRLUKAST 10 MG PO TABS
10.0000 mg | ORAL_TABLET | Freq: Two times a day (BID) | ORAL | 1 refills | Status: DC
  Filled 2024-02-08 – 2024-03-05 (×2): qty 180, 90d supply, fill #0
  Filled 2024-05-31: qty 180, 90d supply, fill #1

## 2024-02-08 MED ORDER — FLUTICASONE PROPIONATE HFA 110 MCG/ACT IN AERO
2.0000 | INHALATION_SPRAY | Freq: Two times a day (BID) | RESPIRATORY_TRACT | 5 refills | Status: DC
  Filled 2024-02-08: qty 12, 30d supply, fill #0
  Filled 2024-03-05: qty 12, 30d supply, fill #1
  Filled 2024-04-05: qty 12, 30d supply, fill #2

## 2024-02-08 MED ORDER — EPINEPHRINE 0.3 MG/0.3ML IJ SOAJ
0.3000 mg | INTRAMUSCULAR | 1 refills | Status: AC | PRN
  Filled 2024-02-08: qty 2, 30d supply, fill #0
  Filled 2024-04-05: qty 2, 30d supply, fill #1

## 2024-02-08 NOTE — Progress Notes (Signed)
 Specialty Pharmacy Ongoing Clinical Assessment Note  Rachel Fry is a 68 y.o. female who is being followed by the specialty pharmacy service for RxSp Allergy   Patient's specialty medication(s) reviewed today: Omalizumab (Xolair)   Missed doses in the last 4 weeks: 0   Patient/Caregiver did not have any additional questions or concerns.   Therapeutic benefit summary: Patient is achieving benefit   Adverse events/side effects summary: Experienced adverse events/side effects (stinging at injection site, tolerable)   Patient's therapy is appropriate to: Continue    Goals Addressed             This Visit's Progress    Reduce signs and symptoms       Patient is not on track and worsening. Patient will maintain adherence.  Patient was well controlled on every 2 week injections; however, insurance will not approve that frequency, her provider's office tried to do a PA, but it was denied.  She reports that she is well controlled until a week before her next dose.  Her provider is aware and monitoring.          Follow up:  6 months  Malachi Screws Specialty Pharmacist

## 2024-02-08 NOTE — Progress Notes (Signed)
 Specialty Pharmacy Refill Coordination Note  Rachel Fry is a 68 y.o. female contacted today regarding refills of specialty medication(s) Omalizumab (Xolair)   Patient requested Delivery   Delivery date: 02/16/24   Verified address: [address],  [place],  [place] [address]   Medication will be filled on 02/15/24.

## 2024-02-08 NOTE — Patient Instructions (Addendum)
 Post COVID syndrome with urticarial rash - Continue with Xolair subcutaneous 300mg  every 4 weeks.  Keep Epipen.  - Continue Zyrtec 10mg  daily.  Okay to increase to maximum 20mg  twice daily, if uncontrolled.    Moderate persistent asthma - Daily controller medication(s): Flovent 110mcg 2 puffs twice daily with spacer and Accolate twice daily - Prior to physical activity: albuterol 2 puffs 10-15 minutes before physical activity. - Rescue medications: Albuterol 2 puffs every 4-6 hours as needed for wheezing/shortness of breath. - Changes during respiratory infections or worsening symptoms: Increase Flovent 110mcg to 4 puffs twice daily for TWO WEEKS. - Asthma control goals:  * Full participation in all desired activities (may need albuterol before activity) * Albuterol use two time or less a week on average (not counting use with activity) * Cough interfering with sleep two time or less a month * Oral steroids no more than once a year * No hospitalizations  Chronic Rhinitis - Continue Zyrtec 10mg  daily. Dosing dependent on hives.

## 2024-02-08 NOTE — Progress Notes (Signed)
 FOLLOW UP Date of Service/Encounter:  02/08/24   Subjective:  Rachel Fry (DOB: 1956-07-09) is a 68 y.o. female who [facility] for follow up for asthma, urticaria, chronic rhinitis, long COVID.    History obtained from: chart review and patient. Last visit was with Dr Dellis Anes for asthma, urticaria, chronic rhinitis.  On Flovent, Zafirlukast, Xolair.  Since last visit, reports there was some issues with air filter at work and her asthma was flaring up more; switched to Advair but was causing trouble sleeping at night; tried reducing to daily but then having worsening coughing/wheezing in afternoons/evenings.  Now back on Flovent and doing well on it.  Needs albuterol about once a month.  No ER visits/oral prednisone use.  Has been on Zafirlukast for years. CMP 11/2022 showed elevated liver function but reports this was around the time she had COVID; since then her PCP has repeated labs and liver function has improved.   Does note some increased congestion/drainage with the pollen season.  Using Zyrtec and that helps control it.  Hives are doing okay; does have more outbreaks now that insurance will only allow her to do Xolair every 4 weeks rather than 2 weeks.  By the 2 week mark, she gets itchy and breaks out.  Does sometimes increase her Zyrtec but that does not always control it. No issues with Xolair at home.   Past Medical History: Past Medical History:  Diagnosis Date   Allergy    Asthma    COVID-19    History of kidney stones    Hypothyroidism    Migraines    Numbness and tingling    left side of body, occasionally right side    S/P Botox injection    Urticaria     Objective:  BP (!) 160/88 (BP Location: Left Arm, Patient Position: Sitting, Cuff Size: Normal) Comment: Patient stated she forgot to take her BP meds this morning then rushing to appt. MNB  Pulse 83   Temp (!) 97.3 F (36.3 C) (Temporal)   Resp 16   Ht 5\' 5"   (1.651 m)   Wt 165 lb 14.4 oz (75.3 kg)   SpO2 96%   BMI 27.61 kg/m  Body mass index is 27.61 kg/m. Physical Exam: GEN: alert, well developed HEENT: clear conjunctiva, nose with mild inferior turbinate hypertrophy, pink nasal mucosa, + clear rhinorrhea, + cobblestoning HEART: regular rate and rhythm, no murmur LUNGS: clear to auscultation bilaterally, no coughing, unlabored respiration SKIN: no rashes or lesions  Spirometry:  Tracings reviewed. Her effort: It was hard to get consistent efforts and there is a question as to whether this reflects a maximal maneuver. FVC: 2.47L, 82% predicted  FEV1: 1.6L, 69% predicted FEV1/FVC ratio: 65% Interpretation: Spirometry consistent with mild obstructive disease.  Please see scanned spirometry results for details.  Assessment:   1. Chronic idiopathic urticaria   2. Chronic rhinitis   3. Moderate persistent asthma without complication     Plan/Recommendations:  Post COVID syndrome with urticarial rash - Some outbreaks of hives with spacing out the intervals but we have not been able to get q2 weeks approved by insurance.  - Continue with Xolair subcutaneous 300mg  every 4 weeks. Keep Epipen.  - Continue Zyrtec 10mg  daily.  Okay to increase to maximum 20mg  twice daily, if uncontrolled.    Moderate persistent asthma - Symptomatically doing well, continue ICS and Zafirlukast. Spirometry with mild obstruction.  - Daily controller medication(s): Flovent  2 puffs twice daily with spacer and Zafirlukast 10mg  twice daily.  - Prior to physical activity: albuterol 2 puffs 10-15 minutes before physical activity. - Rescue medications: Albuterol 2 puffs every 4-6 hours as needed for wheezing/shortness of breath. - Changes during respiratory infections or worsening symptoms: Increase Flovent 110mcg to 4 puffs twice daily for TWO WEEKS. - Asthma control goals:  * Full participation in all desired activities (may need albuterol before  activity) * Albuterol use two time or less a week on average (not counting use with activity) * Cough interfering with sleep two time or less a month * Oral steroids no more than once a year * No hospitalizations  Chronic Rhinitis - Controlled  - Continue Zyrtec 10mg  daily. Dosing dependent on hives.     Return in about 6 months (around 08/09/2024).  Kristen Petri, MD [facility] 

## 2024-02-09 ENCOUNTER — Other Ambulatory Visit (HOSPITAL_COMMUNITY): Payer: Self-pay

## 2024-02-09 ENCOUNTER — Other Ambulatory Visit: Payer: Self-pay

## 2024-03-05 ENCOUNTER — Other Ambulatory Visit (HOSPITAL_COMMUNITY): Payer: Self-pay

## 2024-03-06 ENCOUNTER — Other Ambulatory Visit: Payer: Self-pay

## 2024-03-06 ENCOUNTER — Other Ambulatory Visit (HOSPITAL_COMMUNITY): Payer: Self-pay

## 2024-03-07 ENCOUNTER — Other Ambulatory Visit: Payer: Self-pay

## 2024-03-10 ENCOUNTER — Other Ambulatory Visit: Payer: Self-pay

## 2024-03-10 ENCOUNTER — Other Ambulatory Visit: Payer: Self-pay | Admitting: Pharmacy Technician

## 2024-03-10 NOTE — Progress Notes (Signed)
 Specialty Pharmacy Refill Coordination Note  Rachel Fry is a 68 y.o. female contacted today regarding refills of specialty medication(s) Omalizumab  (Xolair )   Patient requested Delivery   Delivery date: 03/15/24   Verified address: [address]  [place]  [place]. [address]   Medication will be filled on 03/14/24.

## 2024-03-11 DIAGNOSIS — U099 Post covid-19 condition, unspecified: Secondary | ICD-10-CM | POA: Diagnosis not present

## 2024-03-11 DIAGNOSIS — I10 Essential (primary) hypertension: Secondary | ICD-10-CM | POA: Diagnosis not present

## 2024-03-11 DIAGNOSIS — M545 Low back pain, unspecified: Secondary | ICD-10-CM | POA: Diagnosis not present

## 2024-03-13 ENCOUNTER — Other Ambulatory Visit: Payer: Self-pay | Admitting: Internal Medicine

## 2024-03-13 ENCOUNTER — Ambulatory Visit
Admission: RE | Admit: 2024-03-13 | Discharge: 2024-03-13 | Disposition: A | Discharge status: Home / Self Care | Source: Ambulatory Visit | Attending: Internal Medicine | Admitting: Internal Medicine

## 2024-03-13 DIAGNOSIS — G8929 Other chronic pain: Secondary | ICD-10-CM

## 2024-03-13 DIAGNOSIS — M419 Scoliosis, unspecified: Secondary | ICD-10-CM | POA: Diagnosis not present

## 2024-03-13 DIAGNOSIS — M545 Low back pain, unspecified: Secondary | ICD-10-CM

## 2024-03-14 ENCOUNTER — Other Ambulatory Visit: Payer: Self-pay

## 2024-03-21 ENCOUNTER — Other Ambulatory Visit: Payer: Self-pay

## 2024-03-21 MED ORDER — METHYLPREDNISOLONE 4 MG PO TBPK
ORAL_TABLET | ORAL | 0 refills | Status: AC
  Filled 2024-03-21: qty 21, 6d supply, fill #0

## 2024-03-27 ENCOUNTER — Other Ambulatory Visit (HOSPITAL_COMMUNITY): Payer: Self-pay

## 2024-03-27 ENCOUNTER — Other Ambulatory Visit: Payer: Self-pay

## 2024-04-04 ENCOUNTER — Ambulatory Visit (INDEPENDENT_AMBULATORY_CARE_PROVIDER_SITE_OTHER): Admitting: Family Medicine

## 2024-04-04 ENCOUNTER — Other Ambulatory Visit (HOSPITAL_COMMUNITY): Payer: Self-pay

## 2024-04-04 VITALS — BP 128/68 | HR 78 | Resp 14 | Ht 65.0 in | Wt 167.0 lb

## 2024-04-04 DIAGNOSIS — Z1159 Encounter for screening for other viral diseases: Secondary | ICD-10-CM

## 2024-04-04 DIAGNOSIS — E7800 Pure hypercholesterolemia, unspecified: Secondary | ICD-10-CM | POA: Diagnosis not present

## 2024-04-04 DIAGNOSIS — E559 Vitamin D deficiency, unspecified: Secondary | ICD-10-CM

## 2024-04-04 DIAGNOSIS — K5909 Other constipation: Secondary | ICD-10-CM | POA: Diagnosis not present

## 2024-04-04 DIAGNOSIS — I10 Essential (primary) hypertension: Secondary | ICD-10-CM

## 2024-04-04 DIAGNOSIS — U099 Post covid-19 condition, unspecified: Secondary | ICD-10-CM

## 2024-04-04 DIAGNOSIS — J4540 Moderate persistent asthma, uncomplicated: Secondary | ICD-10-CM | POA: Diagnosis not present

## 2024-04-04 DIAGNOSIS — Z1231 Encounter for screening mammogram for malignant neoplasm of breast: Secondary | ICD-10-CM

## 2024-04-04 DIAGNOSIS — E039 Hypothyroidism, unspecified: Secondary | ICD-10-CM | POA: Diagnosis not present

## 2024-04-04 DIAGNOSIS — R202 Paresthesia of skin: Secondary | ICD-10-CM | POA: Diagnosis not present

## 2024-04-04 DIAGNOSIS — R739 Hyperglycemia, unspecified: Secondary | ICD-10-CM | POA: Diagnosis not present

## 2024-04-04 DIAGNOSIS — M150 Primary generalized (osteo)arthritis: Secondary | ICD-10-CM | POA: Diagnosis not present

## 2024-04-04 DIAGNOSIS — Z114 Encounter for screening for human immunodeficiency virus [HIV]: Secondary | ICD-10-CM

## 2024-04-04 DIAGNOSIS — Z7689 Persons encountering health services in other specified circumstances: Secondary | ICD-10-CM

## 2024-04-04 DIAGNOSIS — L508 Other urticaria: Secondary | ICD-10-CM

## 2024-04-04 DIAGNOSIS — R42 Dizziness and giddiness: Secondary | ICD-10-CM

## 2024-04-04 MED ORDER — MECLIZINE HCL 25 MG PO TABS
25.0000 mg | ORAL_TABLET | Freq: Three times a day (TID) | ORAL | 1 refills | Status: AC | PRN
  Filled 2024-04-04: qty 90, 30d supply, fill #0

## 2024-04-04 MED ORDER — ATORVASTATIN CALCIUM 20 MG PO TABS
20.0000 mg | ORAL_TABLET | Freq: Every day | ORAL | 3 refills | Status: DC
  Filled 2024-04-04: qty 90, 90d supply, fill #0
  Filled 2024-06-09: qty 90, 90d supply, fill #1

## 2024-04-04 MED ORDER — LEVOTHYROXINE SODIUM 50 MCG PO TABS
50.0000 ug | ORAL_TABLET | Freq: Every day | ORAL | 3 refills | Status: DC
Start: 2024-04-04 — End: 2024-06-09
  Filled 2024-04-04: qty 90, 90d supply, fill #0
  Filled 2024-06-09: qty 90, 90d supply, fill #1

## 2024-04-04 MED ORDER — LINACLOTIDE 145 MCG PO CAPS
145.0000 ug | ORAL_CAPSULE | Freq: Every day | ORAL | 5 refills | Status: DC
  Filled 2024-04-04: qty 30, 30d supply, fill #0
  Filled 2024-05-02: qty 30, 30d supply, fill #1
  Filled 2024-05-31: qty 30, 30d supply, fill #2
  Filled 2024-06-09: qty 30, 30d supply, fill #3

## 2024-04-04 MED ORDER — SPIRONOLACTONE 50 MG PO TABS
50.0000 mg | ORAL_TABLET | Freq: Two times a day (BID) | ORAL | 3 refills | Status: DC
  Filled 2024-04-04: qty 180, 90d supply, fill #0
  Filled 2024-06-09: qty 180, 90d supply, fill #1

## 2024-04-04 MED ORDER — CLONIDINE HCL 0.1 MG PO TABS
0.1000 mg | ORAL_TABLET | Freq: Two times a day (BID) | ORAL | 3 refills | Status: DC
  Filled 2024-04-04: qty 180, 90d supply, fill #0
  Filled 2024-06-09: qty 180, 90d supply, fill #1

## 2024-04-04 MED ORDER — VITAMIN D (ERGOCALCIFEROL) 1.25 MG (50000 UNIT) PO CAPS
50000.0000 [IU] | ORAL_CAPSULE | ORAL | 3 refills | Status: AC
  Filled 2024-04-04: qty 12, 84d supply, fill #0
  Filled 2024-08-13: qty 12, 84d supply, fill #1
  Filled 2024-11-05: qty 12, 84d supply, fill #2

## 2024-04-04 MED ORDER — EZETIMIBE 10 MG PO TABS
10.0000 mg | ORAL_TABLET | Freq: Every day | ORAL | 3 refills | Status: AC
  Filled 2024-04-04: qty 90, 90d supply, fill #0
  Filled 2024-08-29: qty 90, 90d supply, fill #1
  Filled 2024-11-28: qty 90, 90d supply, fill #2

## 2024-04-04 NOTE — Progress Notes (Unsigned)
 New patient visit   Patient: Rachel Fry   DOB: 08/25/1956   68 y.o. Female  MRN: [id number] Visit Date: 04/04/2024  Today's healthcare provider: Carlean Charter, DO   Chief Complaint  Patient presents with   Establish Care    Last mammogram 08/26/23 Previous Dr. Hershal Loron   Subjective    Rachel Fry is a 68 y.o. female who presents today as a new patient to establish care.  HPI HPI     Establish Care    Additional comments: Last mammogram 08/26/23 Previous Dr. Hershal Loron      Last edited by Selinda Dales, CMA on 04/04/2024  3:24 PM.       Rachel Fry is a 68 year old female who presents for transferring care and medication management.  She is transferring her care due to her previous provider retiring and the need for a consistent healthcare provider. She has a history of migraines, currently managed with Nurtec, Frova , and Vyepti  infusions every three months. There is a significant reduction in migraine frequency, from daily to using Nurtec three to four times a month on current medications. She experiences complex migraines with symptoms such as vision changes, left-sided weakness, numbness, and temporal headaches. Prednisone  was used for a persistent migraine in February of last year.  - previously tried medications which had either significant side effects or were ineffective, include: gabapentin, Tegretol, Topamax, Zonegran, Lyrica, Lamictal, Depakote, atenolol, Botox , Trigger point injections, Maxalt, Demerol, Fioricet, Ultram , steroids, Cymbalta , Lexapro, Wellbutrin, magnesium, and Emgality.   - tried Aimovig  which was helpful but became less effective over time.  - previous MRA of the head, MRI of the brain, and MRI of the cervical spine were all negative for potential secondary causes of headache.  She has a history of asthma, which is allergy -induced, and she manages it with Accolade, Nasacort , and Zyrtec . She has not had a significant asthma attack in  years. She also takes famotidine, 40 mg at night and 20 mg in the morning, to manage reflux and prevent asthma exacerbations.  She experiences long COVID symptoms following a COVID-19 infection in 2020, including fatigue, shortness of breath, chest pain, rash, and gastrointestinal issues. She takes Xolair  monthly to manage a rash and hives that developed post-COVID. Linzess  is used to manage constipation and abdominal pain, which she attributes to long COVID and previous medication use.  She has a history of hypertension and is currently on spironolactone  and clonidine . She also takes torsemide  as needed for fluid retention, which she attributes to dietary sodium intake.  She has a history of vertigo, which she manages with meclizine  and Dramamine, especially when traveling. She reports increased sensitivity to motion since her COVID-19 infection.  Her medication list includes levothyroxine  for thyroid  management, atorvastatin  for cholesterol, and various supplements including vitamin D , vitamin K, and a B complex. She takes vitamin D  every two weeks and has been doing so for four to five years.  She has a history of osteoarthritis, with Heberden's nodes present, which she manages with Tylenol  arthritis. She avoids medications that might affect her gastrointestinal health.  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 She was treated symptomatically at the time.     Past Medical History:  Diagnosis Date   Allergy     Arthritis    Asthma    COVID-19    History of kidney stones    Hyperlipidemia  Hypertension    Hypothyroidism    Migraines    Numbness and tingling    left side of body, occasionally right side    S/P Botox  injection    Urticaria    Past Surgical History:  Procedure Laterality Date   ADENOIDECTOMY     ARM NEUROPLASTY Right 1994 and 1996   for chronic pain   CESAREAN SECTION     COLONOSCOPY WITH PROPOFOL  N/A  11/15/2023   Procedure: COLONOSCOPY WITH PROPOFOL ;  Surgeon: Selena Daily, MD;  Location: [facility];  Service: Gastroenterology;  Laterality: N/A;   CYSTOSCOPY W/ RETROGRADES Left 04/23/2020   Procedure: CYSTOSCOPY WITH RETROGRADE PYELOGRAM;  Surgeon: Geraline Knapp, MD;  Location: [facility];  Service: Urology;  Laterality: Left;   CYSTOSCOPY W/ RETROGRADES Left 05/13/2020   Procedure: CYSTOSCOPY WITH RETROGRADE PYELOGRAM;  Surgeon: Dustin Gimenez, MD;  Location: [facility];  Service: Urology;  Laterality: Left;   CYSTOSCOPY WITH STENT PLACEMENT Left 04/23/2020   Procedure: CYSTOSCOPY WITH STENT PLACEMENT;  Surgeon: Geraline Knapp, MD;  Location: [facility];  Service: Urology;  Laterality: Left;   CYSTOSCOPY/URETEROSCOPY/HOLMIUM LASER/STENT PLACEMENT Left 05/13/2020   Procedure: CYSTOSCOPY/URETEROSCOPY/LASER/STENT EXCHANGE;  Surgeon: Dustin Gimenez, MD;  Location: [facility];  Service: Urology;  Laterality: Left;   FRACTURE SURGERY     OVARIAN CYST REMOVAL  1973   POLYPECTOMY  11/15/2023   Procedure: POLYPECTOMY;  Surgeon: Selena Daily, MD;  Location: [facility];  Service: Gastroenterology;;   STONE EXTRACTION WITH BASKET  05/13/2020   Procedure: STONE EXTRACTION WITH BASKET;  Surgeon: Dustin Gimenez, MD;  Location: [facility];  Service: Urology;;   TONSILLECTOMY     TONSILLECTOMY AND ADENOIDECTOMY     TUBAL LIGATION  1988   Family Status  Relation Name Status   Mother  Deceased at age 34       stroke   Father  Deceased at age 45       MI   Brother Porfirio Bristol Alive   Brother Mara Seminole Alive   Brother Aden Honor Alive   Brother Arther Larve Alive       heart transplant   MGM  (Not Specified)   PGF  (Not Specified)   Neg Hx  (Not Specified)  No partnership data on file   Family History  Problem Relation Age of Onset   Hyperlipidemia Mother    Hypertension Mother    Breast cancer Mother 71   Thyroid  disease Mother    Stroke Mother    Dementia Mother    Hypertension  Father    Cardiomyopathy Father    Peripheral Artery Disease Father    Hyperlipidemia Brother    Hypertension Brother    Hypertension Brother    Hyperlipidemia Brother    Thyroid  disease Brother    Hypertension Brother    Birth defects Brother    Congenital heart disease Brother    Cancer Brother        Kidney cancer   Colon cancer Maternal Grandmother    Glaucoma Maternal Grandmother    Stroke Paternal Grandfather    Neuropathy Neg Hx    Multiple sclerosis Neg Hx    Migraines Neg Hx    Social History   Socioeconomic History   Marital status: Married    Spouse name: Debborah Fairly   Number of children: 3   Years of education: 16   Highest education level: Bachelor's degree (e.g., BA, AB, BS)  Occupational History   Occupation: Cardiab rehab  Tobacco Use   Smoking status: Never  Passive exposure: Never   Smokeless tobacco: Never  Vaping Use   Vaping status: Never Used  Substance and Sexual Activity   Alcohol use: No   Drug use: No   Sexual activity: Not on file  Other Topics Concern   Not on file  Social History Narrative   Lives with husband   Caffeine use: none   Left handed   Social Drivers of Health   Financial Resource Strain: Low Risk  (04/04/2024)   Overall Financial Resource Strain (CARDIA)    Difficulty of Paying Living Expenses: Not hard at all  Food Insecurity: No Food Insecurity (04/04/2024)   Hunger Vital Sign    Worried About Running Out of Food in the Last Year: Never true    Ran Out of Food in the Last Year: Never true  Transportation Needs: No Transportation Needs (04/04/2024)   PRAPARE - Administrator, Civil Service (Medical): No    Lack of Transportation (Non-Medical): No  Physical Activity: Inactive (04/04/2024)   Exercise Vital Sign    Days of Exercise per Week: 0 days    Minutes of Exercise per Session: 0 min  Stress: No Stress Concern Present (04/04/2024)   [employer] of Occupational Health - Occupational Stress  Questionnaire    Feeling of Stress : Not at all  Social Connections: Moderately Isolated (04/04/2024)   Social Connection and Isolation Panel [NHANES]    Frequency of Communication with Friends and Family: More than three times a week    Frequency of Social Gatherings with Friends and Family: Once a week    Attends Religious Services: Never    Database administrator or Organizations: No    Attends Engineer, structural: Not on file    Marital Status: Married   Outpatient Medications Prior to Visit  Medication Sig   albuterol  (VENTOLIN  HFA) 108 (90 Base) MCG/ACT inhaler Inhale 2 puffs into the lungs every 4 (four) hours as needed for wheezing or shortness of breath.   b complex vitamins capsule Take 1 capsule by mouth daily.   cetirizine  (ZYRTEC ) 10 MG tablet Take 1 tablet (10 mg total) by mouth every evening.   EPINEPHrine  0.3 mg/0.3 mL IJ SOAJ injection Inject 1 pen (0.3 mg) into the muscle as needed for anaphylaxis.   Eptinezumab -jjmr (VYEPTI ) 100 MG/ML injection Inject 300 mg into the vein every 3 (three) months.   famotidine (PEPCID) 20 MG tablet Take 20 mg by mouth 2 (two) times daily.   fluticasone  (FLOVENT  HFA) 110 MCG/ACT inhaler Inhale 2 puffs into the lungs in the morning and at bedtime.   frovatriptan  (FROVA ) 2.5 MG tablet Take 1 tablet (2.5 mg total) by mouth daily as needed for migraine.   Menaquinone-7 (VITAMIN K2 PO) Take by mouth.   omalizumab  (XOLAIR ) 300 MG/2  ML prefilled syringe Inject 300 mg into the skin every 28 (twenty-eight) days.   Polyethyl Glycol-Propyl Glycol (SYSTANE OP) Place 1-2 drops into both eyes 2 (two) times daily.   Rimegepant Sulfate  (NURTEC) 75 MG TBDP Dissolve 1 tablet by mouth daily as needed.   Spacer/Aero-Holding Ismael Maria Use as directed with inhaler   torsemide  (DEMADEX ) 20 MG tablet Take 2 tablets (40 mg total) by mouth daily as needed.   triamcinolone  (NASACORT ) 55 MCG/ACT AERO nasal inhaler Place 2 sprays into the nose at bedtime.    zafirlukast  (ACCOLATE ) 10 MG tablet Take 1 tablet (10 mg total) by mouth 2 times daily.   zoledronic  acid (RECLAST ) 5 MG/100ML SOLN  injection Inject 5 mg into the vein as directed. Once a year.   [DISCONTINUED] atorvastatin  (LIPITOR) 20 MG tablet Take 1 tablet (20 mg total) by mouth daily.   [DISCONTINUED] cloNIDine  (CATAPRES ) 0.1 MG tablet Take 1 tablet (0.1 mg total) by mouth 2 (two) times daily.   [DISCONTINUED] ezetimibe  (ZETIA ) 10 MG tablet Take 1 tablet (10 mg total) by mouth daily.   [DISCONTINUED] levothyroxine  (EUTHYROX ) 50 MCG tablet Take 1 tablet (50 mcg total) by mouth daily.   [DISCONTINUED] linaclotide  (LINZESS ) 145 MCG CAPS capsule Take 1 capsule (145 mcg total) by mouth daily.   [DISCONTINUED] meclizine  (ANTIVERT ) 25 MG tablet Take 1 tablet (25 mg total) by mouth 3 (three) times daily as needed.   [DISCONTINUED] spironolactone  (ALDACTONE ) 50 MG tablet Take 1 tablet (50 mg total) by mouth 2 (two) times daily.   [DISCONTINUED] Vitamin D , Ergocalciferol , (DRISDOL ) 1.25 MG (50000 UNIT) CAPS capsule Take 1 capsule (50,000 Units total) by mouth once a week.   meloxicam  (MOBIC ) 15 MG tablet Take 1 tablet (15 mg total) by mouth daily. (Patient not taking: Reported on 04/04/2024)   [DISCONTINUED] ascorbic acid (VITAMIN C) 250 MG CHEW Chew 250 mg by mouth daily. (Patient not taking: Reported on 04/04/2024)   [DISCONTINUED] Bacillus Coagulans-Inulin (PROBIOTIC) 1-250 BILLION-MG CAPS Probiotic  Take 1 cap po daily (Patient not taking: Reported on 04/04/2024)   No facility-administered medications prior to visit.   Allergies  Allergen Reactions   Flonase  [Fluticasone  Propionate] Other (See Comments)    The scent in flonase  causes wheezing    Latex     Wheezing   Fish Oil Itching   Grass Pollen(K-O-R-T-Swt Vern)    Percocet [Oxycodone -Acetaminophen ] Other (See Comments)    Reports her pharmacist told her never to take this medication because of her codeine allergy    Sulfa Antibiotics      abdominal pain   Tree Extract    Codeine Nausea And Vomiting and Rash    Tolerates tramadol      Immunization History  Administered Date(s) Administered   Influenza,inj,Quad PF,6+ Mos 08/27/2015   Influenza-Unspecified 07/08/2017, 07/06/2018, 07/27/2019, 07/24/2020   PFIZER Comirnaty Martina Sledge Top)Covid-19 Tri-Sucrose Vaccine 06/13/2021   PFIZER(Purple Top)SARS-COV-2 Vaccination 02/02/2020, 02/23/2020   Pfizer Covid-19 Vaccine Bivalent Booster 51yrs & up 01/02/2022   Pfizer(Comirnaty )Fall Seasonal Vaccine 12 years and older 11/13/2022   Tdap 10/11/2009, 03/16/2019    Health Maintenance  Topic Date Due   Medicare Annual Wellness (AWV)  Never done   Pneumonia Vaccine 74+ Years old (1 of 2 - PCV) Never done   Zoster Vaccines- Shingrix (1 of 2) Never done   COVID-19 Vaccine (6 - 2024-25 season) 07/26/2024 (Originally 06/27/2023)   INFLUENZA VACCINE  05/26/2024   MAMMOGRAM  08/23/2025   Colonoscopy  11/14/2028   DTaP/Tdap/Td (3 - Td or Tdap) 03/15/2029   DEXA SCAN  Completed   Hepatitis C Screening  Completed   HPV VACCINES  Aged Out   Meningococcal B Vaccine  Aged Out    Patient Care Team: Clarence Cogswell, Asencion Blacksmith, DO as PCP - General (Family Medicine) Devorah Fonder, MD as PCP - Cardiology (Cardiology)       Objective    BP 128/68 (BP Location: Left Arm, Patient Position: Sitting, Cuff Size: Normal)   Pulse 78   Resp 14   Ht 5' 5 (1.651 m)   Wt 167 lb (75.8 kg)   SpO2 99%   BMI 27.79 kg/m     Physical Exam Vitals and nursing note reviewed.  Constitutional:  General: She is not in acute distress.    Appearance: Normal appearance.  HENT:     Head: Normocephalic and atraumatic.  Eyes:     General: No scleral icterus.    Conjunctiva/sclera: Conjunctivae normal.  Cardiovascular:     Rate and Rhythm: Normal rate.  Pulmonary:     Effort: Pulmonary effort is normal.  Neurological:     Mental Status: She is alert and oriented to person, place, and time. Mental status  is at baseline.  Psychiatric:        Mood and Affect: Mood normal.        Behavior: Behavior normal.     Depression Screen    04/04/2024    3:47 PM 11/28/2020    4:46 PM 07/29/2020    4:31 PM 02/08/2020    3:31 PM  PHQ 2/9 Scores  PHQ - 2 Score 0 0 0 0  PHQ- 9 Score 3 5 3     No results found for any visits on 04/04/24.  Assessment & Plan     Benign essential hypertension -     Comprehensive metabolic panel with GFR -     cloNIDine  HCl; Take 1 tablet (0.1 mg total) by mouth 2 (two) times daily.  Dispense: 180 tablet; Refill: 3 -     Spironolactone ; Take 1 tablet (50 mg total) by mouth 2 (two) times daily.  Dispense: 180 tablet; Refill: 3  Establishing care with new doctor, encounter for  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 hauler  Hypercholesteremia -     Lipid panel -     Atorvastatin  Calcium ; Take 1 tablet (20 mg total) by mouth daily.  Dispense: 90 tablet; Refill: 3 -     Ezetimibe ; Take 1 tablet (10 mg total) by mouth daily.  Dispense: 90 tablet; Refill: 3  Moderate persistent asthma, uncomplicated  Chronic urticaria  Vertigo -     Meclizine  HCl; Take 1 tablet (25 mg total) by mouth 3 (three) times daily as needed.  Dispense: 90 tablet; Refill: 1  Primary osteoarthritis involving multiple joints  Hypothyroidism, unspecified type -     TSH Rfx on Abnormal to Free T4 -     Levothyroxine  Sodium; Take 1 tablet (50 mcg total) by mouth daily.  Dispense: 90 tablet; Refill: 3  Chronic constipation -     linaCLOtide ; Take 1 capsule (145 mcg total) by mouth daily.  Dispense: 30 capsule; Refill: 5  Paresthesia -     Vitamin B12  Vitamin D  deficiency -     VITAMIN D  25 Hydroxy (Vit-D Deficiency, Fractures) -     Vitamin D  (Ergocalciferol ); Take 1 capsule (50,000 Units total) by mouth once a week.  Dispense: 12 capsule; Refill: 3  Elevated blood sugar -     Hemoglobin A1c  Encounter for screening for HIV -     HIV Antibody (routine testing w rflx)  Encounter for screening mammogram for  breast cancer -     3D Screening Mammogram, Left and Right; Future  Encounter for hepatitis C screening test for low risk patient -     HCV Ab w Reflex to Quant PCR     Establish care with a new doctor, encounter for Routine health maintenance discussed. Eligible for Prevnar 20/21 and shingles vaccine. COVID booster discussed for upcoming travel. - Order mammogram for October. - Administer Prevnar 20/21 vaccine. - Administer shingles vaccine. - Consider COVID booster before travel.  Hypertension Managed with spironolactone  and clonidine . Clonidine  not ideal due to rebound hypertension risk. Discussed switching  to valsartan or an alternative after retirement. - Plan to review antihypertensives and consider transition from clonidine  after retirement. - Monitor blood pressure regularly.  Migraine Chronic migraines with vision changes, left-sided weakness, and numbness. Improved with Vyepti  infusions, reducing frequency to 3-4 times a month. Nurtec used for acute episodes. Recent exacerbation required prednisone . Insurance coverage for Frova  uncertain. - Continue Vyepti  infusions every three months. - Continue Nurtec as needed for acute episodes. - Continue Frova . - Follows with neurology; defer to specialist management  Long COVID; chronic urticaria Symptoms include fatigue, shortness of breath, chest pain, rash, and gastrointestinal issues. Xolair  manages rash and hives, with symptoms worsening when a dose is due. - Continue Xolair  monthly. - Follows with allergy ; defer to specialist management  Moderate persistent asthma Likely allergy -induced, no recent severe attacks. Managed with Accolate , Nasacort , and Zyrtec . - Continue Accolate , Nasacort , and Zyrtec .  Chronic constipation Chronic constipation, possibly related to long COVID and previous medication use. Managed with Linzess . - Continue Linzess  for constipation management.  Vertigo Intermittent vertigo, possibly  post-COVID related. Managed with meclizine  and Dramamine. - Continue meclizine  and Dramamine as needed.  Osteoarthritis with Heberden's nodes Osteoarthritis in fingers causing intermittent pain. Managed with Tylenol  Arthritis. - Use Tylenol  Arthritis as needed.  Hypothyroidism Managed with levothyroxine . No recent issues. - Continue levothyroxine  as prescribed.    Follow-up Plan for follow-up visits and management of ongoing health issues. Transition to Medicare and schedule a welcome to Medicare visit. - Schedule annual wellness visit and physical exam in 4-6 weeks. - Monitor blood pressure and adjust medications as needed. - Follow up on medication refills and insurance coverage.  Return in about 6 weeks (around 05/16/2024) for Welcome to mAWV .     I discussed the assessment and treatment plan with the patient  The patient was provided an opportunity to ask questions and all were answered. The patient agreed with the plan and demonstrated an understanding of the instructions.   The patient was advised to call back or seek an in-person evaluation if the symptoms worsen or if the condition fails to improve as anticipated.    Carlean Charter, DO  [facility] [phone] (phone) [phone] (fax)  [facility]

## 2024-04-05 ENCOUNTER — Other Ambulatory Visit (HOSPITAL_COMMUNITY): Payer: Self-pay

## 2024-04-05 ENCOUNTER — Encounter: Payer: Self-pay | Admitting: Pharmacist

## 2024-04-05 ENCOUNTER — Other Ambulatory Visit: Payer: Self-pay

## 2024-04-06 ENCOUNTER — Other Ambulatory Visit: Payer: Self-pay

## 2024-04-06 ENCOUNTER — Other Ambulatory Visit (HOSPITAL_COMMUNITY): Payer: Self-pay

## 2024-04-06 MED ORDER — NURTEC 75 MG PO TBDP
ORAL_TABLET | ORAL | 5 refills | Status: AC
  Filled 2024-04-06: qty 8, 30d supply, fill #0
  Filled 2024-05-02: qty 8, 30d supply, fill #1
  Filled 2024-06-09: qty 8, 30d supply, fill #2
  Filled 2024-08-29: qty 8, 30d supply, fill #3
  Filled 2024-11-05: qty 8, 30d supply, fill #4

## 2024-04-07 ENCOUNTER — Other Ambulatory Visit (HOSPITAL_COMMUNITY): Payer: Self-pay

## 2024-04-07 ENCOUNTER — Other Ambulatory Visit: Payer: Self-pay | Admitting: Allergy & Immunology

## 2024-04-07 ENCOUNTER — Encounter (INDEPENDENT_AMBULATORY_CARE_PROVIDER_SITE_OTHER): Payer: Self-pay

## 2024-04-07 ENCOUNTER — Other Ambulatory Visit: Payer: Self-pay | Admitting: Pharmacy Technician

## 2024-04-07 ENCOUNTER — Other Ambulatory Visit: Payer: Self-pay

## 2024-04-07 NOTE — Progress Notes (Signed)
 Specialty Pharmacy Refill Coordination Note  Rachel Fry is a 68 y.o. female contacted today regarding refills of specialty medication(s) Omalizumab  (Xolair )   Patient requested (Patient-Rptd) Delivery   Delivery date: 04/11/2024 Verified address: (Patient-Rptd) [address] [place]  [place] [address]   Medication will be filled on 04/10/24 or as soon as MD approves.    RR sent to MD; Message also sent to Juliana. Need new Rx patient no longer have [facility] insurance & we can not use the Re-write from [facility]

## 2024-04-10 ENCOUNTER — Other Ambulatory Visit (HOSPITAL_COMMUNITY): Payer: Self-pay

## 2024-04-10 ENCOUNTER — Telehealth: Payer: Self-pay

## 2024-04-10 NOTE — Telephone Encounter (Signed)
 New script needed-patient no longer a [employer] Employee Message sent to Tammy to resend

## 2024-04-10 NOTE — Telephone Encounter (Signed)
 Patients new cards have been received and are in chart-message sent to [facility]

## 2024-04-12 ENCOUNTER — Other Ambulatory Visit: Payer: Self-pay

## 2024-04-14 ENCOUNTER — Other Ambulatory Visit: Payer: Self-pay

## 2024-04-17 ENCOUNTER — Other Ambulatory Visit: Payer: Self-pay

## 2024-04-17 ENCOUNTER — Telehealth: Payer: Self-pay

## 2024-04-17 MED ORDER — XOLAIR 300 MG/2ML ~~LOC~~ SOSY
300.0000 mg | PREFILLED_SYRINGE | SUBCUTANEOUS | 11 refills | Status: AC
  Filled 2024-04-17: qty 2, 28d supply, fill #0
  Filled 2024-05-03: qty 2, 28d supply, fill #1
  Filled 2024-05-30: qty 2, 28d supply, fill #2
  Filled 2024-06-23 – 2024-06-27 (×2): qty 2, 28d supply, fill #3
  Filled 2024-07-21: qty 2, 28d supply, fill #4
  Filled 2024-08-14 – 2024-08-15 (×2): qty 2, 28d supply, fill #5
  Filled 2024-09-19: qty 2, 28d supply, fill #6
  Filled 2024-10-17: qty 2, 28d supply, fill #7
  Filled 2024-11-13 – 2024-11-14 (×2): qty 2, 28d supply, fill #8

## 2024-04-17 NOTE — Progress Notes (Signed)
 Patient approved copay of $871.43.

## 2024-04-17 NOTE — Telephone Encounter (Signed)
 Dr.Padgett on call provider received a voicemail from [facility] [employer] in regards to the Flovent  inhaler. I called the pharmacy back and they were calling to initiate PA- request for additional information is needed. They asked if the patient had tried Arnuity, Asmanex,Qvar,Budesonide . I informed the patient has been given the Budesonide (Pulmicort ) and Budesonide -Formoterol (Symbicort ) in the past by different providers. I informed per documentation from ov on 05/12/2022- She could not tolerate the Spiriva with the hoarsness. This was similar for Breztri and Symbicort  . I informed from her last ov 02/08/2024-  switched to Advair  but was causing trouble sleeping at night; tried reducing to daily but then having worsening coughing/wheezing in afternoons/evenings. Now back on Flovent  and doing well on it.  I was informed a response to the PA will be given on/before June 26 via fax.

## 2024-04-19 NOTE — Telephone Encounter (Signed)
 Received a a letter via fax from [employer]. The request for the fluticasone  propionate 157mcg/actuation HFA aerosol inhaler has been denied. The formulary alternatives are Arnuity,Asmanex,Qvar. Letter has been placed to go to bulk scanning. Forwarding to on call provider as Dr.Patel is out. Please advise.

## 2024-04-24 ENCOUNTER — Encounter: Payer: Self-pay | Admitting: Allergy & Immunology

## 2024-04-24 ENCOUNTER — Telehealth: Payer: Self-pay | Admitting: Allergy & Immunology

## 2024-04-24 NOTE — Telephone Encounter (Signed)
 Called and left a message for Rachel Fry to call our office back to discuss and resolve what he had questions or concerns for.

## 2024-04-24 NOTE — Telephone Encounter (Signed)
 Rachel Fry called back and wanted to confirm that Xolair  was going to used for Urticaria and confirm that was the original diagnosis from January 2024. I did confirm that from his previous note, urticaria was being treated by Xolair .

## 2024-04-24 NOTE — Telephone Encounter (Signed)
 PT called to follow up on document from [employer], I advised would alert clinical staff. She thanked. Reviewed with Beth, who advised this encounter should be able to handle

## 2024-04-24 NOTE — Telephone Encounter (Signed)
 Franky from [facility] [employer] call about the pt medication and requested a call back [phone].

## 2024-04-25 DIAGNOSIS — R202 Paresthesia of skin: Secondary | ICD-10-CM | POA: Diagnosis not present

## 2024-04-25 DIAGNOSIS — E7800 Pure hypercholesterolemia, unspecified: Secondary | ICD-10-CM | POA: Diagnosis not present

## 2024-04-25 DIAGNOSIS — I10 Essential (primary) hypertension: Secondary | ICD-10-CM | POA: Diagnosis not present

## 2024-04-25 DIAGNOSIS — E559 Vitamin D deficiency, unspecified: Secondary | ICD-10-CM | POA: Diagnosis not present

## 2024-04-25 DIAGNOSIS — Z1159 Encounter for screening for other viral diseases: Secondary | ICD-10-CM | POA: Diagnosis not present

## 2024-04-25 DIAGNOSIS — Z114 Encounter for screening for human immunodeficiency virus [HIV]: Secondary | ICD-10-CM | POA: Diagnosis not present

## 2024-04-25 DIAGNOSIS — E039 Hypothyroidism, unspecified: Secondary | ICD-10-CM | POA: Diagnosis not present

## 2024-04-25 DIAGNOSIS — R739 Hyperglycemia, unspecified: Secondary | ICD-10-CM | POA: Diagnosis not present

## 2024-04-25 MED ORDER — SPACER/AERO-HOLDING CHAMBERS DEVI
0 refills | Status: DC
  Filled 2024-04-25: qty 1, 30d supply, fill #0

## 2024-04-25 MED ORDER — QVAR REDIHALER 80 MCG/ACT IN AERB
2.0000 | INHALATION_SPRAY | Freq: Two times a day (BID) | RESPIRATORY_TRACT | 2 refills | Status: DC
  Filled 2024-04-25: qty 10.6, 30d supply, fill #0
  Filled 2024-05-22: qty 10.6, 30d supply, fill #1
  Filled 2024-06-09: qty 10.6, 30d supply, fill #2

## 2024-04-25 NOTE — Telephone Encounter (Signed)
 I called the patient and informed of message. I informed if she cannot tolerate inhaler to call the office back. She also requested a new spacer as hers is a couple years old. Spacer and Qvar have both been sent to [facility].

## 2024-04-25 NOTE — Addendum Note (Signed)
 Addended by: MENDEZ-MUNGARAY, Gifford Ballon M on: 04/25/2024 05:21 PM   Modules accepted: Orders

## 2024-04-25 NOTE — Addendum Note (Signed)
 Addended by: MENDEZ-MUNGARAY, Koda Routon M on: 04/25/2024 05:26 PM   Modules accepted: Orders

## 2024-04-26 ENCOUNTER — Telehealth: Payer: Self-pay | Admitting: *Deleted

## 2024-04-26 ENCOUNTER — Other Ambulatory Visit: Payer: Self-pay

## 2024-04-26 ENCOUNTER — Other Ambulatory Visit (HOSPITAL_COMMUNITY): Payer: Self-pay

## 2024-04-26 LAB — VITAMIN B12: Vitamin B-12: 520 pg/mL (ref 232–1245)

## 2024-04-26 LAB — COMPREHENSIVE METABOLIC PANEL WITH GFR
ALT: 20 IU/L (ref 0–32)
AST: 20 IU/L (ref 0–40)
Albumin: 4.5 g/dL (ref 3.9–4.9)
Alkaline Phosphatase: 75 IU/L (ref 44–121)
BUN/Creatinine Ratio: 12 (ref 12–28)
BUN: 13 mg/dL (ref 8–27)
Bilirubin Total: 0.5 mg/dL (ref 0.0–1.2)
CO2: 22 mmol/L (ref 20–29)
Calcium: 10.4 mg/dL — ABNORMAL HIGH (ref 8.7–10.3)
Chloride: 103 mmol/L (ref 96–106)
Creatinine, Ser: 1.09 mg/dL — ABNORMAL HIGH (ref 0.57–1.00)
Globulin, Total: 2 g/dL (ref 1.5–4.5)
Glucose: 100 mg/dL — ABNORMAL HIGH (ref 70–99)
Potassium: 4.3 mmol/L (ref 3.5–5.2)
Sodium: 141 mmol/L (ref 134–144)
Total Protein: 6.5 g/dL (ref 6.0–8.5)
eGFR: 55 mL/min/{1.73_m2} — ABNORMAL LOW (ref >59)

## 2024-04-26 LAB — HEMOGLOBIN A1C
Est. average glucose Bld gHb Est-mCnc: 128 mg/dL
Hgb A1c MFr Bld: 6.1 % — ABNORMAL HIGH (ref 4.8–5.6)

## 2024-04-26 LAB — LIPID PANEL
Chol/HDL Ratio: 3.2 ratio (ref 0.0–4.4)
Cholesterol, Total: 173 mg/dL (ref 100–199)
HDL: 54 mg/dL (ref >39)
LDL Chol Calc (NIH): 98 mg/dL (ref 0–99)
Triglycerides: 117 mg/dL (ref 0–149)
VLDL Cholesterol Cal: 21 mg/dL (ref 5–40)

## 2024-04-26 LAB — HCV AB W REFLEX TO QUANT PCR: HCV Ab: NONREACTIVE

## 2024-04-26 LAB — HIV ANTIBODY (ROUTINE TESTING W REFLEX): HIV Screen 4th Generation wRfx: NONREACTIVE

## 2024-04-26 LAB — HCV INTERPRETATION

## 2024-04-26 LAB — TSH RFX ON ABNORMAL TO FREE T4: TSH: 2.7 u[IU]/mL (ref 0.450–4.500)

## 2024-04-26 LAB — VITAMIN D 25 HYDROXY (VIT D DEFICIENCY, FRACTURES): Vit D, 25-Hydroxy: 65.4 ng/mL (ref 30.0–100.0)

## 2024-04-27 ENCOUNTER — Other Ambulatory Visit: Payer: Self-pay

## 2024-05-02 ENCOUNTER — Other Ambulatory Visit: Payer: Self-pay

## 2024-05-02 ENCOUNTER — Other Ambulatory Visit (HOSPITAL_COMMUNITY): Payer: Self-pay

## 2024-05-03 ENCOUNTER — Encounter (INDEPENDENT_AMBULATORY_CARE_PROVIDER_SITE_OTHER): Payer: Self-pay

## 2024-05-03 ENCOUNTER — Other Ambulatory Visit: Payer: Self-pay

## 2024-05-03 ENCOUNTER — Other Ambulatory Visit: Payer: Self-pay | Admitting: Pharmacy Technician

## 2024-05-03 NOTE — Progress Notes (Signed)
 Specialty Pharmacy Refill Coordination Note  Rachel Fry is a 68 y.o. female contacted today regarding refills of specialty medication(s) Omalizumab  (Xolair )   Patient requested (Patient-Rptd) Delivery   Delivery date: 05/09/24 Verified address: (Patient-Rptd) [address] [place] [address]   Medication will be filled on 05/08/24.

## 2024-05-05 ENCOUNTER — Other Ambulatory Visit: Payer: Self-pay

## 2024-05-08 ENCOUNTER — Other Ambulatory Visit: Payer: Self-pay

## 2024-05-08 ENCOUNTER — Telehealth: Payer: Self-pay

## 2024-05-08 NOTE — Progress Notes (Signed)
 PA required for tezspire. Routed to Juliana for assistance.

## 2024-05-08 NOTE — Telephone Encounter (Signed)
 Per call center- PA is needed for Xolair .

## 2024-05-08 NOTE — Progress Notes (Signed)
 Message sent to [facility] for PA.

## 2024-05-09 ENCOUNTER — Other Ambulatory Visit: Payer: Self-pay

## 2024-05-09 ENCOUNTER — Other Ambulatory Visit (HOSPITAL_COMMUNITY): Payer: Self-pay

## 2024-05-09 NOTE — Telephone Encounter (Signed)
 Pt called and stated that approval letter shows for the auto-injector but pt is currently on the syringe

## 2024-05-09 NOTE — Telephone Encounter (Signed)
 Rachel Fry had called this department to inquire about the possibility of having infusions for Vyepti  transferred to this location, next scheduled dose is for 05/02/24.  Provider office contacted, [facility]-[phone].  Instructed by office to have the patient keep her appointment on 05/02/24 and they would discuss with her.  Notified Rachel Fry to inform her of that, she states that she will not be able to keep that appt. as scheduled.  Rachel Fry was instructed to contact the provider and advise them of her wishes to transfer infusions to this site.  We will need a prescription from the provider so that we can seek prior authorization before scheduling.

## 2024-05-10 ENCOUNTER — Other Ambulatory Visit: Payer: Self-pay

## 2024-05-11 ENCOUNTER — Ambulatory Visit: Payer: Self-pay | Admitting: Family Medicine

## 2024-05-11 ENCOUNTER — Other Ambulatory Visit (HOSPITAL_COMMUNITY): Payer: Self-pay

## 2024-05-11 ENCOUNTER — Other Ambulatory Visit: Payer: Self-pay

## 2024-05-11 NOTE — Progress Notes (Signed)
 Patient submitted PA for Xolair  prefilled syringes per her message My flovent  and Xolair  are both denied by [employer]. I have placed an appeal on both. The office is unable to fix this as the patient started the process and did it for the wrong medication.

## 2024-05-11 NOTE — Telephone Encounter (Signed)
 Rachel Fry did not complete this prior auth for the patient and is pending patient to bring in new insurance card. Whoever did complete this prior shara would be the one that needs to change it/resubmit for the Syringes.

## 2024-05-12 ENCOUNTER — Other Ambulatory Visit: Payer: Self-pay

## 2024-05-12 NOTE — Progress Notes (Signed)
 [facility] [employer], and they were able to convert the PA to syringes. Patient will pick up 7/19.

## 2024-05-13 ENCOUNTER — Other Ambulatory Visit (HOSPITAL_COMMUNITY): Payer: Self-pay

## 2024-05-19 ENCOUNTER — Other Ambulatory Visit: Payer: Self-pay

## 2024-05-22 ENCOUNTER — Other Ambulatory Visit (HOSPITAL_COMMUNITY): Payer: Self-pay

## 2024-05-22 ENCOUNTER — Other Ambulatory Visit: Payer: Self-pay

## 2024-05-29 ENCOUNTER — Encounter: Payer: Self-pay | Admitting: Family Medicine

## 2024-05-29 ENCOUNTER — Ambulatory Visit: Admitting: Family Medicine

## 2024-05-29 VITALS — BP 125/73 | HR 66 | Ht 65.0 in | Wt 164.0 lb

## 2024-05-29 DIAGNOSIS — Z0001 Encounter for general adult medical examination with abnormal findings: Secondary | ICD-10-CM | POA: Diagnosis not present

## 2024-05-29 DIAGNOSIS — G43009 Migraine without aura, not intractable, without status migrainosus: Secondary | ICD-10-CM | POA: Diagnosis not present

## 2024-05-29 DIAGNOSIS — I10 Essential (primary) hypertension: Secondary | ICD-10-CM | POA: Diagnosis not present

## 2024-05-29 DIAGNOSIS — L508 Other urticaria: Secondary | ICD-10-CM

## 2024-05-29 DIAGNOSIS — R600 Localized edema: Secondary | ICD-10-CM

## 2024-05-29 DIAGNOSIS — E039 Hypothyroidism, unspecified: Secondary | ICD-10-CM | POA: Diagnosis not present

## 2024-05-29 DIAGNOSIS — J302 Other seasonal allergic rhinitis: Secondary | ICD-10-CM

## 2024-05-29 DIAGNOSIS — U099 Post covid-19 condition, unspecified: Secondary | ICD-10-CM | POA: Diagnosis not present

## 2024-05-29 DIAGNOSIS — M8000XD Age-related osteoporosis with current pathological fracture, unspecified site, subsequent encounter for fracture with routine healing: Secondary | ICD-10-CM | POA: Diagnosis not present

## 2024-05-29 DIAGNOSIS — K5909 Other constipation: Secondary | ICD-10-CM | POA: Diagnosis not present

## 2024-05-29 DIAGNOSIS — J4540 Moderate persistent asthma, uncomplicated: Secondary | ICD-10-CM

## 2024-05-29 DIAGNOSIS — Z0000 Encounter for general adult medical examination without abnormal findings: Secondary | ICD-10-CM

## 2024-05-29 NOTE — Progress Notes (Signed)
 Annual Wellness Visit     Patient: Rachel Fry, Female    DOB: 03-Apr-1956, 68 y.o.   MRN: [id number] Visit Date: 05/29/2024  Today's Provider: LAURAINE LOISE BUOY, DO   Chief Complaint  Patient presents with   Annual Exam    Diet - General watching sodium intake Exercise - none at the moment Feeling - well Sleeping - well Concerns - none   Care Management    Shingles - Pharmacy Pneumonia - not today   Subjective    Rachel Fry is a 68 y.o. female who presents today for her Annual Wellness Visit.  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  She experiences symptoms related to long COVID, including occasional chest tightness and significant shortness of breath, particularly upon exertion or when carrying items. She has undergone a VO2 stress test, echocardiogram, EKG, and pulmonary function tests, and she believes the results were normal. Severe fatigue is also present, described as 'hitting like a wall,' requiring her to rest before resuming activities. She retired at the end of June and now works [employer], which she believes has helped reduce her fatigue.  She has a history of asthma and has been using a beclomethasone inhaler for almost a month, which she finds effective. Previously, she used Flovent , but it was discontinued due to insurance coverage issues. She also uses an albuterol  inhaler as needed. She has tried various inhalers, including Breztri and Spiriva, and is currently using Advair  only in the morning due to its stimulating effects.  She experiences gastrointestinal symptoms, including bloating and abdominal pain, and was diagnosed with IBS-C. She was previously reliant on Miralax but now uses Linzess , which has been effective until the past week. She has resumed using Miralax to manage her symptoms. She notes that decreased activity and inadequate water intake  during recent travel may have contributed to her symptoms.  Chronic right ear pain radiating to her jaw has been present since 2018, with no identified cause despite multiple evaluations. She also has chronic urticaria, managed with Xolair , and allergies for which she takes Zyrtec  and Nasacort .  She experiences complex migraines with left-sided numbness, tingling, and weakness. She has undergone CT scans and MRIs, which were normal. She manages her weight with torsemide , taking 40 mg when her weight increases by 2-3 pounds.  She has a history of osteoporosis and a T-10 compression fracture, discovered after experiencing back pain. She is on levothyroxine  for hypothyroidism and has a family history of thyroid  disease. She also reports a persistent rash and hives since a reaction to lisinopril, which she associates with post-COVID effects.     Medications: Outpatient Medications Prior to Visit  Medication Sig   albuterol  (VENTOLIN  HFA) 108 (90 Base) MCG/ACT inhaler Inhale 2 puffs into the lungs every 4 (four) hours as needed for wheezing or shortness of breath.   atorvastatin  (LIPITOR) 20 MG tablet Take 1 tablet (20 mg total) by mouth daily.   b complex vitamins capsule Take 1 capsule by mouth daily.   beclomethasone (QVAR  REDIHALER) 80 MCG/ACT inhaler Inhale 2 puffs into the lungs 2 (two) times daily.   cetirizine  (ZYRTEC ) 10 MG tablet Take 1 tablet (10 mg total) by mouth every evening.   cloNIDine  (CATAPRES ) 0.1 MG tablet Take 1 tablet (0.1 mg total) by mouth 2 (two) times daily.   EPINEPHrine  0.3 mg/0.3 mL IJ SOAJ injection Inject  1 pen (0.3 mg) into the muscle as needed for anaphylaxis.   Eptinezumab -jjmr (VYEPTI ) 100 MG/ML injection Inject 300 mg into the vein every 3 (three) months.   ezetimibe  (ZETIA ) 10 MG tablet Take 1 tablet (10 mg total) by mouth daily.   famotidine (PEPCID) 20 MG tablet Take 20 mg by mouth 2 (two) times daily.   frovatriptan  (FROVA ) 2.5 MG tablet Take 1 tablet (2.5  mg total) by mouth daily as needed for migraine.   levothyroxine  (EUTHYROX ) 50 MCG tablet Take 1 tablet (50 mcg total) by mouth daily.   linaclotide  (LINZESS ) 145 MCG CAPS capsule Take 1 capsule (145 mcg total) by mouth daily.   meclizine  (ANTIVERT ) 25 MG tablet Take 1 tablet (25 mg total) by mouth 3 (three) times daily as needed.   meloxicam  (MOBIC ) 15 MG tablet Take 1 tablet (15 mg total) by mouth daily.   Menaquinone-7 (VITAMIN K2 PO) Take by mouth.   omalizumab  (XOLAIR ) 300 MG/2  ML prefilled syringe Inject 300 mg into the skin every 28 (twenty-eight) days.   Polyethyl Glycol-Propyl Glycol (SYSTANE OP) Place 1-2 drops into both eyes 2 (two) times daily.   Rimegepant Sulfate  (NURTEC) 75 MG TBDP Dissolve 1 tablet by mouth daily as needed.   Spacer/Aero-Holding Raguel FRENCH Use as directed with inhaler   spironolactone  (ALDACTONE ) 50 MG tablet Take 1 tablet (50 mg total) by mouth 2 (two) times daily.   torsemide  (DEMADEX ) 20 MG tablet Take 2 tablets (40 mg total) by mouth daily as needed.   triamcinolone  (NASACORT ) 55 MCG/ACT AERO nasal inhaler Place 2 sprays into the nose at bedtime.   Vitamin D , Ergocalciferol , (DRISDOL ) 1.25 MG (50000 UNIT) CAPS capsule Take 1 capsule (50,000 Units total) by mouth once a week.   zafirlukast  (ACCOLATE ) 10 MG tablet Take 1 tablet (10 mg total) by mouth 2 times daily.   zoledronic  acid (RECLAST ) 5 MG/100ML SOLN injection Inject 5 mg into the vein as directed. Once a year.   [DISCONTINUED] fluticasone  (FLOVENT  HFA) 110 MCG/ACT inhaler Inhale 2 puffs into the lungs in the morning and at bedtime.   [DISCONTINUED] Spacer/Aero-Holding Raguel DEVI Use spacer with inhaler as directed   No facility-administered medications prior to visit.    Allergies  Allergen Reactions   Flonase  [Fluticasone  Propionate] Other (See Comments)    The scent in flonase  causes wheezing    Latex     Wheezing   Fish Oil Itching   Grass Pollen(K-O-R-T-Swt Vern)    Percocet  [Oxycodone -Acetaminophen ] Other (See Comments)    Reports her pharmacist told her never to take this medication because of her codeine allergy    Sulfa Antibiotics     abdominal pain   Tree Extract    Codeine Nausea And Vomiting and Rash    Tolerates tramadol      Patient Care Team: Donzella Lauraine SAILOR, DO as PCP - General (Family Medicine) Perla Evalene PARAS, MD as PCP - Cardiology (Cardiology)  Review of Systems  Constitutional:  Negative for chills, fatigue and fever.  HENT:  Negative for congestion, ear pain, rhinorrhea, sneezing and sore throat.   Eyes: Negative.  Negative for pain and redness.  Respiratory:  Positive for chest tightness and shortness of breath (on exertion primarily). Negative for cough and wheezing.   Cardiovascular:  Negative for chest pain and leg swelling.  Gastrointestinal:  Positive for abdominal pain (intermittent; bloating). Negative for blood in stool, constipation, diarrhea, nausea and vomiting.  Endocrine: Negative for polydipsia and polyphagia.  Genitourinary: Negative.  Negative  for dysuria, flank pain, hematuria, pelvic pain, vaginal bleeding and vaginal discharge.  Musculoskeletal:  Negative for arthralgias, back pain, gait problem and joint swelling.  Skin:  Negative for rash.  Neurological: Negative.  Negative for dizziness, tremors, seizures, weakness, light-headedness, numbness and headaches.  Hematological:  Negative for adenopathy.  Psychiatric/Behavioral: Negative.  Negative for behavioral problems, confusion and dysphoric mood. The patient is not nervous/anxious and is not hyperactive.          Objective    Vitals: BP 125/73 (BP Location: Left Arm, Patient Position: Sitting, Cuff Size: Normal)   Pulse 66   Ht 5' 5 (1.651 m)   Wt 164 lb (74.4 kg)   SpO2 99%   BMI 27.29 kg/m      Physical Exam Vitals and nursing note reviewed.  Constitutional:      General: She is awake.     Appearance: Normal appearance.  HENT:     Head:  Normocephalic and atraumatic.     Right Ear: Tympanic membrane, ear canal and external ear normal.     Left Ear: Tympanic membrane, ear canal and external ear normal.     Nose: Nose normal.     Mouth/Throat:     Mouth: Mucous membranes are moist.     Pharynx: Oropharynx is clear. No oropharyngeal exudate or posterior oropharyngeal erythema.  Eyes:     General: No scleral icterus.    Extraocular Movements: Extraocular movements intact.     Conjunctiva/sclera: Conjunctivae normal.     Pupils: Pupils are equal, round, and reactive to light.  Neck:     Thyroid : No thyromegaly or thyroid  tenderness.     Vascular: No carotid bruit.  Cardiovascular:     Rate and Rhythm: Normal rate and regular rhythm.     Pulses: Normal pulses.     Heart sounds: Normal heart sounds.  Pulmonary:     Effort: Pulmonary effort is normal. No tachypnea, bradypnea or respiratory distress.     Breath sounds: Normal breath sounds. No stridor. No wheezing, rhonchi or rales.  Abdominal:     General: Bowel sounds are normal. There is no distension.     Palpations: Abdomen is soft. There is no mass.     Tenderness: There is no abdominal tenderness. There is no guarding.     Hernia: No hernia is present.  Musculoskeletal:     Cervical back: Normal range of motion and neck supple.     Right lower leg: Edema (trace) present.     Left lower leg: Edema (trace) present.  Lymphadenopathy:     Cervical: No cervical adenopathy.  Skin:    General: Skin is warm and dry.     Findings: Bruising (left medial shin) present.  Neurological:     Mental Status: She is alert and oriented to person, place, and time. Mental status is at baseline.  Psychiatric:        Mood and Affect: Mood normal.        Behavior: Behavior normal.     Most recent functional status assessment:    05/29/2024    9:10 AM  In your present state of health, do you have any difficulty performing the following activities:  Hearing? 0  Vision? 0   Difficulty concentrating or making decisions? 0  Walking or climbing stairs? 0  Dressing or bathing? 0  Doing errands, shopping? 0  Preparing Food and eating ? N  Using the Toilet? N  In the past six months, have you accidently leaked  urine? N  Do you have problems with loss of bowel control? N  Managing your Medications? N  Managing your Finances? N  Housekeeping or managing your Housekeeping? N   Most recent fall risk assessment:    05/29/2024    9:13 AM  Fall Risk   Falls in the past year? 0  Number falls in past yr: 0  Injury with Fall? 0  Risk for fall due to : No Fall Risks  Follow up Falls evaluation completed    Most recent depression screenings:    05/29/2024   12:59 PM 04/04/2024    3:47 PM  PHQ 2/9 Scores  PHQ - 2 Score 0 0  PHQ- 9 Score 0 3   Most recent cognitive screening:    05/29/2024   12:56 PM  6CIT Screen  What Year? 0 points  What month? 0 points  What time? 0 points  Count back from 20 0 points  Months in reverse 0 points  Repeat phrase 0 points  Total Score 0 points   Most recent Audit-C alcohol use screening    05/29/2024   12:59 PM  Alcohol Use Disorder Test (AUDIT)  1. How often do you have a drink containing alcohol? 0  2. How many drinks containing alcohol do you have on a typical day when you are drinking? 0  3. How often do you have six or more drinks on one occasion? 0  AUDIT-C Score 0   A score of 3 or more in women, and 4 or more in men indicates increased risk for alcohol abuse, EXCEPT if all of the points are from question 1   No results found for any visits on 05/29/24.  Assessment & Plan     Annual wellness visit done today including the all of the following: Reviewed patient's Family Medical History Reviewed and updated list of patient's medical providers Assessment of cognitive impairment was done Assessed patient's functional ability Established a written schedule for health screening services Health Risk Assessent  Completed and Reviewed Patient is not currently on any opioid medications.   Exercise Activities and Dietary recommendations  Goals      Reduce signs and symptoms     Patient is not on track and worsening. Patient will maintain adherence.  Patient was well controlled on every 2 week injections; however, insurance will not approve that frequency, her provider's office tried to do a PA, but it was denied.  She reports that she is well controlled until a week before her next dose.  Her provider is aware and monitoring.          Immunization History  Administered Date(s) Administered   Influenza,inj,Quad PF,6+ Mos 08/27/2015   Influenza-Unspecified 07/08/2017, 07/06/2018, 07/27/2019, 07/24/2020   PFIZER Comirnaty ETTERGray Top)Covid-19 Tri-Sucrose Vaccine 06/13/2021   PFIZER(Purple Top)SARS-COV-2 Vaccination 02/02/2020, 02/23/2020, 09/13/2020   Pfizer Covid-19 Vaccine Bivalent Booster 52yrs & up 01/02/2022   Pfizer(Comirnaty )Fall Seasonal Vaccine 12 years and older 11/13/2022   Tdap 10/11/2009, 03/16/2019    Health Maintenance  Topic Date Due   Pneumococcal Vaccine: 50+ Years (1 of 2 - PCV) Never done   Zoster Vaccines- Shingrix (1 of 2) Never done   COVID-19 Vaccine (7 - 2024-25 season) 07/26/2024 (Originally 06/27/2023)   INFLUENZA VACCINE  01/23/2025 (Originally 05/26/2024)   Medicare Annual Wellness (AWV)  05/29/2025   MAMMOGRAM  08/23/2025   Colonoscopy  11/14/2028   DTaP/Tdap/Td (3 - Td or Tdap) 03/15/2029   DEXA SCAN  Completed   Hepatitis C Screening  Completed   Hepatitis B Vaccines  Aged Out   HPV VACCINES  Aged Out   Meningococcal B Vaccine  Aged Out     Discussed health benefits of physical activity, and encouraged her to engage in regular exercise appropriate for her age and condition.    Welcome to Medicare preventive visit  Benign essential hypertension  Osteoporosis with current pathological fracture with routine healing, unspecified osteoporosis type, subsequent  encounter  Hypercalcemia -     Parathyroid  hormone, intact (no Ca) -     Calcium , ionized -     PTH-related peptide  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 hauler  Moderate persistent asthma, uncomplicated  Chronic urticaria  Seasonal allergic rhinitis, unspecified trigger  Chronic constipation  Hypothyroidism, unspecified type  Atypical migraine  Bilateral lower extremity edema      Welcome to Medicare visit Physical exam overall unremarkable except as noted above. Routine lab work ordered as noted.  Discussed vaccinations. Plans to travel to [place]. - Administer pneumonia vaccine. - Plan to receive flu vaccine when available. - Schedule shingles vaccine series at local pharmacy.  Hypertension Managed with clonidine  and spironolactone . Clonidine  dosing challenging during travel. - Continue clonidine  and spironolactone . - Revisit hypertension management post-travel. - Consider alternative antihypertensives if needed.  Osteoporosis with T10 compression fracture; hypercalcemia Previous back pain attributed to kidney stones. Elevated calcium  levels noted. - Order parathyroid  hormone level, PTH-rP and ionized calcium .    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 Fatigue improved with reduced work hours. - Continue Qvar  inhaler. - Continue albuterol  inhaler as needed.  Asthma Managed with Qvar  and albuterol . - Continue Qvar  inhaler. - Continue albuterol  inhaler as needed.  Chronic urticaria Xolair  for chronic urticaria. Persistent hive possibly related to post-COVID syndrome.  Chronic allergic rhinitis Managed with Zyrtec  and Nasacort . - Continue Zyrtec . - Continue Nasacort .  Chronic constipation with irritable bowel syndrome Managed with Linzess . Recent decrease in efficacy noted. Reduced activity and hydration may contribute. - Continue Linzess . - Use Miralax as needed. - Increase physical activity and  hydration.  Hypothyroidism Managed with levothyroxine . - Continue levothyroxine .  Atypical migraine; migraine with complex aura Atypical migraines with left-sided symptoms (paresthesia and hemiparesis).  Currently very infrequent and managed with Tylenol  as needed. - Continue Tylenol  as needed as needed.  Bilateral lower extremity edema Intermittent edema managed with torsemide  based on weight monitoring. - Continue torsemide  as needed based on weight changes.    Return in about 6 months (around 11/29/2024) for Chronic f/u, .     I discussed the assessment and treatment plan with the patient  The patient was provided an opportunity to ask questions and all were answered. The patient agreed with the plan and demonstrated an understanding of the instructions.   The patient was advised to call back or seek an in-person evaluation if the symptoms worsen or if the condition fails to improve as anticipated.    LAURAINE LOISE BUOY, DO  [facility] [phone] (phone) [phone] (fax)  [facility]

## 2024-05-30 ENCOUNTER — Other Ambulatory Visit: Payer: Self-pay | Admitting: Pharmacy Technician

## 2024-05-30 ENCOUNTER — Other Ambulatory Visit: Payer: Self-pay

## 2024-05-30 ENCOUNTER — Encounter (INDEPENDENT_AMBULATORY_CARE_PROVIDER_SITE_OTHER): Payer: Self-pay

## 2024-05-30 NOTE — Progress Notes (Signed)
 Specialty Pharmacy Refill Coordination Note  Rachel Fry is a 68 y.o. female contacted today regarding refills of specialty medication(s) Omalizumab  (Xolair )   Patient requested (Patient-Rptd) Delivery   Delivery date: 06/07/24 Verified address: (Patient-Rptd) [address] [place]  [place] [address]   Medication will be filled on 06/06/24.

## 2024-05-31 ENCOUNTER — Other Ambulatory Visit (HOSPITAL_COMMUNITY): Payer: Self-pay

## 2024-05-31 ENCOUNTER — Other Ambulatory Visit: Payer: Self-pay

## 2024-05-31 ENCOUNTER — Other Ambulatory Visit (HOSPITAL_BASED_OUTPATIENT_CLINIC_OR_DEPARTMENT_OTHER): Payer: Self-pay

## 2024-06-01 ENCOUNTER — Other Ambulatory Visit: Payer: Self-pay

## 2024-06-06 ENCOUNTER — Other Ambulatory Visit: Payer: Self-pay

## 2024-06-06 ENCOUNTER — Other Ambulatory Visit (HOSPITAL_COMMUNITY): Payer: Self-pay

## 2024-06-07 ENCOUNTER — Ambulatory Visit: Payer: Self-pay | Admitting: Family Medicine

## 2024-06-09 ENCOUNTER — Other Ambulatory Visit: Payer: Self-pay

## 2024-06-09 ENCOUNTER — Other Ambulatory Visit: Payer: Self-pay | Admitting: Allergy

## 2024-06-09 ENCOUNTER — Other Ambulatory Visit (HOSPITAL_COMMUNITY): Payer: Self-pay

## 2024-06-09 ENCOUNTER — Other Ambulatory Visit: Payer: Self-pay | Admitting: Family Medicine

## 2024-06-09 DIAGNOSIS — E7800 Pure hypercholesterolemia, unspecified: Secondary | ICD-10-CM

## 2024-06-09 DIAGNOSIS — I10 Essential (primary) hypertension: Secondary | ICD-10-CM

## 2024-06-09 DIAGNOSIS — K5909 Other constipation: Secondary | ICD-10-CM

## 2024-06-09 DIAGNOSIS — E039 Hypothyroidism, unspecified: Secondary | ICD-10-CM

## 2024-06-09 LAB — CALCIUM, IONIZED: Calcium, Ion: 5.7 mg/dL — ABNORMAL HIGH (ref 4.5–5.6)

## 2024-06-09 LAB — PTH-RELATED PEPTIDE: PTH-related peptide: <2 pmol/L

## 2024-06-09 LAB — PARATHYROID HORMONE, INTACT (NO CA): PTH: 53 pg/mL (ref 15–65)

## 2024-06-12 ENCOUNTER — Other Ambulatory Visit (HOSPITAL_COMMUNITY): Payer: Self-pay

## 2024-06-12 ENCOUNTER — Telehealth: Payer: Self-pay

## 2024-06-12 ENCOUNTER — Encounter: Payer: Self-pay | Admitting: Allergy & Immunology

## 2024-06-12 ENCOUNTER — Other Ambulatory Visit: Payer: Self-pay

## 2024-06-12 MED ORDER — ATORVASTATIN CALCIUM 20 MG PO TABS
20.0000 mg | ORAL_TABLET | Freq: Every day | ORAL | 3 refills | Status: AC
  Filled 2024-06-12 – 2024-06-14 (×2): qty 90, 90d supply, fill #0
  Filled 2024-10-15: qty 90, 90d supply, fill #1

## 2024-06-12 MED ORDER — LEVOTHYROXINE SODIUM 50 MCG PO TABS
50.0000 ug | ORAL_TABLET | Freq: Every day | ORAL | 3 refills | Status: AC
  Filled 2024-06-12 – 2024-06-14 (×2): qty 90, 90d supply, fill #0
  Filled 2024-09-14: qty 90, 90d supply, fill #1

## 2024-06-12 MED ORDER — LINACLOTIDE 145 MCG PO CAPS
145.0000 ug | ORAL_CAPSULE | Freq: Every day | ORAL | 3 refills | Status: AC
  Filled 2024-06-12 – 2024-06-23 (×2): qty 90, 90d supply, fill #0
  Filled 2024-09-14: qty 90, 90d supply, fill #1

## 2024-06-12 MED ORDER — SPIRONOLACTONE 50 MG PO TABS
50.0000 mg | ORAL_TABLET | Freq: Two times a day (BID) | ORAL | 3 refills | Status: AC
  Filled 2024-06-12 – 2024-06-14 (×2): qty 180, 90d supply, fill #0
  Filled 2024-09-14: qty 180, 90d supply, fill #1

## 2024-06-12 MED ORDER — CLONIDINE HCL 0.1 MG PO TABS
0.1000 mg | ORAL_TABLET | Freq: Two times a day (BID) | ORAL | 3 refills | Status: AC
Start: 2024-06-12 — End: ?
  Filled 2024-06-12 – 2024-06-14 (×2): qty 180, 90d supply, fill #0
  Filled 2024-10-09: qty 180, 90d supply, fill #1

## 2024-06-12 MED ORDER — QVAR REDIHALER 80 MCG/ACT IN AERB
2.0000 | INHALATION_SPRAY | Freq: Two times a day (BID) | RESPIRATORY_TRACT | 1 refills | Status: DC
  Filled 2024-06-12 – 2024-06-14 (×2): qty 31.8, 90d supply, fill #0
  Filled 2024-08-29: qty 31.8, 90d supply, fill #1

## 2024-06-12 NOTE — Telephone Encounter (Signed)
 Hello,  Auth Submission: NO AUTH NEEDED Site of care: Site of care: [facility] Payer: [employer] ppo Medication & CPT/J Code(s) submitted: Vyepti  (Eptinezumab ) J3032 Diagnosis Code:  Route of submission (phone, fax, portal): portal Phone # Fax # Auth type: Buy/Bill PB Units/visits requested: 300mg , q 3months Reference number: [id number] Approval from: 8/11/03/23 to 10/25/24

## 2024-06-13 ENCOUNTER — Other Ambulatory Visit (HOSPITAL_COMMUNITY): Payer: Self-pay

## 2024-06-13 ENCOUNTER — Other Ambulatory Visit: Payer: Self-pay

## 2024-06-13 MED ORDER — XOLAIR 300 MG/2ML ~~LOC~~ SOSY
300.0000 mg | PREFILLED_SYRINGE | SUBCUTANEOUS | 11 refills | Status: DC
  Filled 2024-06-13: qty 4, 28d supply, fill #0

## 2024-06-13 NOTE — Addendum Note (Signed)
 Addended by: OTHA MADELIN HERO on: 06/13/2024 11:34 AM   Modules accepted: Orders

## 2024-06-14 ENCOUNTER — Other Ambulatory Visit (HOSPITAL_COMMUNITY): Payer: Self-pay

## 2024-06-14 ENCOUNTER — Other Ambulatory Visit: Payer: Self-pay

## 2024-06-15 ENCOUNTER — Ambulatory Visit
Admission: RE | Admit: 2024-06-15 | Discharge: 2024-06-15 | Disposition: A | Discharge status: Home / Self Care | Source: Ambulatory Visit | Attending: Physician Assistant | Admitting: Physician Assistant

## 2024-06-15 ENCOUNTER — Other Ambulatory Visit (HOSPITAL_COMMUNITY): Payer: Self-pay | Admitting: Psychiatry

## 2024-06-15 DIAGNOSIS — G43709 Chronic migraine without aura, not intractable, without status migrainosus: Secondary | ICD-10-CM | POA: Diagnosis not present

## 2024-06-15 MED ORDER — SODIUM CHLORIDE 0.9 % IV SOLN
300.0000 mg | Freq: Once | INTRAVENOUS | Status: AC
  Administered 2024-06-15: 300 mg via INTRAVENOUS
  Filled 2024-06-15: qty 3

## 2024-06-20 ENCOUNTER — Other Ambulatory Visit (HOSPITAL_COMMUNITY): Payer: Self-pay

## 2024-06-20 DIAGNOSIS — M7742 Metatarsalgia, left foot: Secondary | ICD-10-CM | POA: Diagnosis not present

## 2024-06-20 DIAGNOSIS — M79672 Pain in left foot: Secondary | ICD-10-CM | POA: Diagnosis not present

## 2024-06-23 ENCOUNTER — Other Ambulatory Visit: Payer: Self-pay

## 2024-06-23 ENCOUNTER — Other Ambulatory Visit (HOSPITAL_COMMUNITY): Payer: Self-pay

## 2024-06-27 ENCOUNTER — Other Ambulatory Visit: Payer: Self-pay

## 2024-06-27 ENCOUNTER — Encounter (INDEPENDENT_AMBULATORY_CARE_PROVIDER_SITE_OTHER): Payer: Self-pay

## 2024-06-27 ENCOUNTER — Other Ambulatory Visit: Payer: Self-pay | Admitting: Pharmacy Technician

## 2024-06-27 NOTE — Progress Notes (Signed)
 Specialty Pharmacy Refill Coordination Note  Rachel Fry is a 68 y.o. female contacted today regarding refills of specialty medication(s) Omalizumab  (Xolair )   Patient requested (Patient-Rptd) Delivery   Delivery date: 06/30/24 Verified address: (Patient-Rptd) [address],  [place]  [place]  [address]   Medication will be filled on 06/29/24.

## 2024-06-28 ENCOUNTER — Other Ambulatory Visit: Payer: Self-pay

## 2024-06-28 DIAGNOSIS — E039 Hypothyroidism, unspecified: Secondary | ICD-10-CM | POA: Diagnosis not present

## 2024-06-28 DIAGNOSIS — M818 Other osteoporosis without current pathological fracture: Secondary | ICD-10-CM | POA: Diagnosis not present

## 2024-06-28 DIAGNOSIS — G43909 Migraine, unspecified, not intractable, without status migrainosus: Secondary | ICD-10-CM | POA: Diagnosis not present

## 2024-06-28 DIAGNOSIS — E782 Mixed hyperlipidemia: Secondary | ICD-10-CM | POA: Diagnosis not present

## 2024-06-28 DIAGNOSIS — K219 Gastro-esophageal reflux disease without esophagitis: Secondary | ICD-10-CM | POA: Diagnosis not present

## 2024-06-30 ENCOUNTER — Ambulatory Visit (INDEPENDENT_AMBULATORY_CARE_PROVIDER_SITE_OTHER): Admitting: Physician Assistant

## 2024-06-30 ENCOUNTER — Encounter: Payer: Self-pay | Admitting: Physician Assistant

## 2024-06-30 ENCOUNTER — Encounter: Payer: Self-pay | Admitting: Allergy & Immunology

## 2024-06-30 VITALS — BP 115/55 | HR 63 | Temp 98.3°F | Ht 65.0 in | Wt 161.2 lb

## 2024-06-30 DIAGNOSIS — H9209 Otalgia, unspecified ear: Secondary | ICD-10-CM

## 2024-06-30 DIAGNOSIS — E049 Nontoxic goiter, unspecified: Secondary | ICD-10-CM

## 2024-06-30 DIAGNOSIS — J4540 Moderate persistent asthma, uncomplicated: Secondary | ICD-10-CM | POA: Diagnosis not present

## 2024-06-30 DIAGNOSIS — L508 Other urticaria: Secondary | ICD-10-CM

## 2024-06-30 DIAGNOSIS — I10 Essential (primary) hypertension: Secondary | ICD-10-CM

## 2024-06-30 DIAGNOSIS — J302 Other seasonal allergic rhinitis: Secondary | ICD-10-CM | POA: Diagnosis not present

## 2024-06-30 DIAGNOSIS — H938X9 Other specified disorders of ear, unspecified ear: Secondary | ICD-10-CM

## 2024-06-30 DIAGNOSIS — E039 Hypothyroidism, unspecified: Secondary | ICD-10-CM

## 2024-06-30 DIAGNOSIS — U099 Post covid-19 condition, unspecified: Secondary | ICD-10-CM

## 2024-06-30 MED ORDER — PREDNISONE 10 MG PO TABS: ORAL_TABLET | ORAL | 0 refills | Status: DC

## 2024-06-30 MED ORDER — AZITHROMYCIN 250 MG PO TABS: ORAL_TABLET | ORAL | 0 refills | Status: DC

## 2024-06-30 NOTE — Progress Notes (Signed)
 Established patient visit  Patient: Rachel Fry   DOB: [birth year]   68 y.o. Female  MRN: [id number] Visit Date: 06/30/2024  Today's healthcare provider: Jolynn Spencer, PA-C   Chief Complaint  Patient presents with   Acute Visit    Patient is present due to upper chest congestion and right ear ache X August 23. She reports taking zyrtec , tylenol  and otc cough congestion.    Subjective     HPI     Acute Visit    Additional comments: Patient is present due to upper chest congestion and right ear ache X August 23. She reports taking zyrtec , tylenol  and otc cough congestion.       Last edited by Lilian Fitzpatrick, CMA on 06/30/2024  8:51 AM.       Discussed the use of AI scribe software for clinical note transcription with the patient, who gave verbal consent to proceed.  History of Present Illness NATALE BARBA Beverley is a 68 year old female who presents with sinusitis and rhinitis symptoms.  She has experienced a clear, runny nose, cough, and intermittent ear pain for almost two weeks. The cough originates from the upper chest with associated tenderness. There is no sore throat, but her throat is sore from persistent coughing. She is concerned about these symptoms due to an upcoming flight to [place] in a week.  She has severe allergies, with symptoms exacerbated by pollen exposure, as noted during a recent trip to [place]. Her daughter and husband had similar symptoms, suggesting a possible viral component. She uses Zyrtec  and Nasacort  for allergies. Xyzal caused lip swelling, and she is allergic to Flonase .  She takes thyroid  medication, clonidine , and spironolactone  for high blood pressure, which she monitors closely, especially when considering decongestants. Her cough produces clear mucus, and there is no wheezing. She has tried Coricidin for her cough but is unsure of its effectiveness. She is cautious about using decongestants due to her blood pressure.  She is on Xolair  for  allergies and has experienced post-COVID hives and rash, for which she took Allegra and Zyrtec . She communicates with her allergist every six months.     05/29/2024   12:59 PM 04/04/2024    3:47 PM 11/28/2020    4:46 PM  Depression screen PHQ 2/9  Decreased Interest 0 0 0  Down, Depressed, Hopeless 0 0 0  PHQ - 2 Score 0 0 0  Altered sleeping 0 0 1  Tired, decreased energy 0 3 3  Change in appetite 0 0 0  Feeling bad or failure about yourself  0 0 0  Trouble concentrating 0 0 1  Moving slowly or fidgety/restless 0 0 0  Suicidal thoughts 0 0 0  PHQ-9 Score 0 3 5  Difficult doing work/chores Not difficult at all Not difficult at all Not difficult at all      04/04/2024    3:47 PM  GAD 7 : Generalized Anxiety Score  Nervous, Anxious, on Edge 0  Control/stop worrying 0  Worry too much - different things 0  Trouble relaxing 0  Restless 0  Easily annoyed or irritable 0  Afraid - awful might happen 0  Total GAD 7 Score 0  Anxiety Difficulty Not difficult at all    Medications: Outpatient Medications Prior to Visit  Medication Sig   albuterol  (VENTOLIN  HFA) 108 (90 Base) MCG/ACT inhaler Inhale 2 puffs into the lungs every 4 (four) hours as needed for wheezing or shortness of breath.   atorvastatin  (LIPITOR) 20  MG tablet Take 1 tablet (20 mg total) by mouth daily.   b complex vitamins capsule Take 1 capsule by mouth daily.   beclomethasone (QVAR  REDIHALER) 80 MCG/ACT inhaler Inhale 2 puffs into the lungs 2 (two) times daily.   cetirizine  (ZYRTEC ) 10 MG tablet Take 1 tablet (10 mg total) by mouth every evening.   cloNIDine  (CATAPRES ) 0.1 MG tablet Take 1 tablet (0.1 mg total) by mouth 2 (two) times daily.   EPINEPHrine  0.3 mg/0.3 mL IJ SOAJ injection Inject 1 pen (0.3 mg) into the muscle as needed for anaphylaxis.   Eptinezumab -jjmr (VYEPTI ) 100 MG/ML injection Inject 300 mg into the vein every 3 (three) months.   ezetimibe  (ZETIA ) 10 MG tablet Take 1 tablet (10 mg total) by mouth  daily.   famotidine (PEPCID) 20 MG tablet Take 20 mg by mouth 2 (two) times daily.   frovatriptan  (FROVA ) 2.5 MG tablet Take 1 tablet (2.5 mg total) by mouth daily as needed for migraine.   levothyroxine  (EUTHYROX ) 50 MCG tablet Take 1 tablet (50 mcg total) by mouth daily.   linaclotide  (LINZESS ) 145 MCG CAPS capsule Take 1 capsule (145 mcg total) by mouth daily.   meclizine  (ANTIVERT ) 25 MG tablet Take 1 tablet (25 mg total) by mouth 3 (three) times daily as needed.   meloxicam  (MOBIC ) 15 MG tablet Take 1 tablet (15 mg total) by mouth daily.   Menaquinone-7 (VITAMIN K2 PO) Take by mouth.   omalizumab  (XOLAIR ) 300 MG/2  ML prefilled syringe Inject 300 mg into the skin every 28 (twenty-eight) days.   Polyethyl Glycol-Propyl Glycol (SYSTANE OP) Place 1-2 drops into both eyes 2 (two) times daily.   Rimegepant Sulfate  (NURTEC) 75 MG TBDP Dissolve 1 tablet by mouth daily as needed.   Spacer/Aero-Holding Raguel FRENCH Use as directed with inhaler   spironolactone  (ALDACTONE ) 50 MG tablet Take 1 tablet (50 mg total) by mouth 2 (two) times daily.   torsemide  (DEMADEX ) 20 MG tablet Take 2 tablets (40 mg total) by mouth daily as needed.   triamcinolone  (NASACORT ) 55 MCG/ACT AERO nasal inhaler Place 2 sprays into the nose at bedtime.   Vitamin D , Ergocalciferol , (DRISDOL ) 1.25 MG (50000 UNIT) CAPS capsule Take 1 capsule (50,000 Units total) by mouth once a week.   zafirlukast  (ACCOLATE ) 10 MG tablet Take 1 tablet (10 mg total) by mouth 2 times daily.   zoledronic  acid (RECLAST ) 5 MG/100ML SOLN injection Inject 5 mg into the vein as directed. Once a year.   No facility-administered medications prior to visit.    Review of Systems All negative Except see HPI       Objective    BP (!) 115/55 (BP Location: Left Arm, Patient Position: Sitting, Cuff Size: Normal)   Pulse 63   Temp 98.3 F (36.8 C) (Oral)   Ht 5' 5 (1.651 m)   Wt 161 lb 3.2 oz (73.1 kg)   SpO2 100%   BMI 26.83 kg/m      Physical Exam Vitals reviewed.  Constitutional:      General: She is not in acute distress.    Appearance: Normal appearance. She is well-developed. She is not diaphoretic.  HENT:     Head: Normocephalic and atraumatic.  Eyes:     General: No scleral icterus.    Conjunctiva/sclera: Conjunctivae normal.  Neck:     Thyroid : No thyromegaly.  Cardiovascular:     Rate and Rhythm: Normal rate and regular rhythm.     Pulses: Normal pulses.  Heart sounds: Normal heart sounds. No murmur heard. Pulmonary:     Effort: Pulmonary effort is normal. No respiratory distress.     Breath sounds: Normal breath sounds. No wheezing, rhonchi or rales.  Musculoskeletal:     Cervical back: Neck supple.     Right lower leg: No edema.     Left lower leg: No edema.  Lymphadenopathy:     Cervical: No cervical adenopathy.  Skin:    General: Skin is warm and dry.     Findings: No rash.  Neurological:     Mental Status: She is alert and oriented to person, place, and time. Mental status is at baseline.  Psychiatric:        Mood and Affect: Mood normal.        Behavior: Behavior normal.      No results found for any visits on 06/30/24.      Assessment & Plan Allergic rhinitis with upper respiratory symptoms and ear pain Chronic allergic rhinitis with clear nasal discharge, cough, and intermittent ear pain. Differential includes viral infection versus allergy  exacerbation. No bacterial infection signs. Known allergies to certain antihistamines and nasal sprays. Upcoming travel may affect symptoms. - Continue Zyrtec . - Increase Nasacort  to twice daily for one week. - Try Mucinex for cough, avoid decongestants. - Consider nasal saline spray to enhance Nasacort  absorption. - Consult allergist for additional antihistamine options and travel advice. - Monitor symptoms, report any worsening or sputum color change.  Hypothyroidism/Thyroid  disorder with goiter Chronic Slightly enlarged thyroid .  Thyroid  dysfunction managed with medication. Recent blood work reviewed with another provider. Continue levothyroxine  50 mcg Last tsh from 04/25/2024 was wnl Will follow-up  Hypertension Chronic and stable Hypertension managed with clonidine  and spironolactone . Blood pressure at 115 mmHg. - Continue current antihypertensive regimen. - Avoid decongestants. Continue elevated creatinine and decreased grf/55 frim 04/25/2024 Advised drinking plenty of water Will follow-up   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 hauler Could be contributing to sinus and allergic rhinitis RTC if symptoms persist and worsen.  Moderate persistent asthma, uncomplicated Chronic and stable Continue taking albuterol  inhaler and QVAR  redihaler Will follow-up  No orders of the defined types were placed in this encounter.   No follow-ups on file.   The patient was advised to call back or seek an in-person evaluation if the symptoms worsen or if the condition fails to improve as anticipated.  I discussed the assessment and treatment plan with the patient. The patient was provided an opportunity to ask questions and all were answered. The patient agreed with the plan and demonstrated an understanding of the instructions.  I, Alanda Colton, PA-C have reviewed all documentation for this visit. The documentation on 06/30/2024  for the exam, diagnosis, procedures, and orders are all accurate and complete.  Jolynn Spencer, [facility], MMS [facility] [phone] (phone) [phone] (fax)  [facility]

## 2024-07-05 ENCOUNTER — Other Ambulatory Visit (HOSPITAL_COMMUNITY): Payer: Self-pay

## 2024-07-05 ENCOUNTER — Other Ambulatory Visit: Payer: Self-pay

## 2024-07-05 DIAGNOSIS — M7742 Metatarsalgia, left foot: Secondary | ICD-10-CM | POA: Diagnosis not present

## 2024-07-05 DIAGNOSIS — M79672 Pain in left foot: Secondary | ICD-10-CM | POA: Diagnosis not present

## 2024-07-05 MED ORDER — METHYLPREDNISOLONE 4 MG PO TBPK
ORAL_TABLET | ORAL | 0 refills | Status: DC
  Filled 2024-07-05: qty 21, 6d supply, fill #0

## 2024-07-19 ENCOUNTER — Other Ambulatory Visit: Payer: Self-pay

## 2024-07-20 ENCOUNTER — Other Ambulatory Visit: Payer: Self-pay

## 2024-07-21 ENCOUNTER — Other Ambulatory Visit: Payer: Self-pay

## 2024-07-21 NOTE — Progress Notes (Signed)
 Specialty Pharmacy Refill Coordination Note  Rachel Fry is a 68 y.o. female contacted today regarding refills of specialty medication(s) Omalizumab  (Xolair )   Patient requested Delivery   Delivery date: 07/25/24   Verified address: [address], [place]  [address]   Medication will be filled on 07/24/24.

## 2024-08-01 ENCOUNTER — Other Ambulatory Visit: Payer: Self-pay

## 2024-08-08 ENCOUNTER — Ambulatory Visit: Admitting: Allergy & Immunology

## 2024-08-08 DIAGNOSIS — M7742 Metatarsalgia, left foot: Secondary | ICD-10-CM | POA: Diagnosis not present

## 2024-08-08 DIAGNOSIS — S92515D Nondisplaced fracture of proximal phalanx of left lesser toe(s), subsequent encounter for fracture with routine healing: Secondary | ICD-10-CM | POA: Diagnosis not present

## 2024-08-08 DIAGNOSIS — M79672 Pain in left foot: Secondary | ICD-10-CM | POA: Diagnosis not present

## 2024-08-09 ENCOUNTER — Other Ambulatory Visit: Payer: Self-pay | Admitting: Foot & Ankle Surgery

## 2024-08-09 DIAGNOSIS — S92515D Nondisplaced fracture of proximal phalanx of left lesser toe(s), subsequent encounter for fracture with routine healing: Secondary | ICD-10-CM

## 2024-08-10 ENCOUNTER — Ambulatory Visit
Admission: RE | Admit: 2024-08-10 | Discharge: 2024-08-10 | Disposition: A | Discharge status: Home / Self Care | Source: Ambulatory Visit | Attending: Foot & Ankle Surgery | Admitting: Foot & Ankle Surgery

## 2024-08-10 DIAGNOSIS — M79672 Pain in left foot: Secondary | ICD-10-CM | POA: Diagnosis not present

## 2024-08-10 DIAGNOSIS — S92515D Nondisplaced fracture of proximal phalanx of left lesser toe(s), subsequent encounter for fracture with routine healing: Secondary | ICD-10-CM | POA: Insufficient documentation

## 2024-08-10 DIAGNOSIS — M19072 Primary osteoarthritis, left ankle and foot: Secondary | ICD-10-CM | POA: Diagnosis not present

## 2024-08-13 ENCOUNTER — Other Ambulatory Visit (HOSPITAL_COMMUNITY): Payer: Self-pay

## 2024-08-14 ENCOUNTER — Other Ambulatory Visit (HOSPITAL_COMMUNITY): Payer: Self-pay

## 2024-08-15 ENCOUNTER — Other Ambulatory Visit: Payer: Self-pay

## 2024-08-15 NOTE — Progress Notes (Signed)
 Specialty Pharmacy Refill Coordination Note  Rachel Fry is a 67 y.o. female contacted today regarding refills of specialty medication(s) Omalizumab  (Xolair )   Patient requested Delivery   Delivery date: 08/30/24   Verified address: [address]   [place] [address]   Medication will be filled on 11.04.25.   Sent patient mychart message with updated delivery date.

## 2024-08-22 ENCOUNTER — Other Ambulatory Visit: Payer: Self-pay

## 2024-08-22 DIAGNOSIS — M79672 Pain in left foot: Secondary | ICD-10-CM | POA: Diagnosis not present

## 2024-08-22 DIAGNOSIS — S92515D Nondisplaced fracture of proximal phalanx of left lesser toe(s), subsequent encounter for fracture with routine healing: Secondary | ICD-10-CM | POA: Diagnosis not present

## 2024-08-22 MED ORDER — FLUBLOK 0.5 ML IM SOSY
0.5000 mL | PREFILLED_SYRINGE | Freq: Once | INTRAMUSCULAR | 0 refills | Status: AC
  Filled 2024-08-22 (×2): qty 0.5, 1d supply, fill #0

## 2024-08-29 ENCOUNTER — Other Ambulatory Visit: Payer: Self-pay

## 2024-08-29 ENCOUNTER — Other Ambulatory Visit: Payer: Self-pay | Admitting: Internal Medicine

## 2024-08-29 MED ORDER — ZAFIRLUKAST 10 MG PO TABS
10.0000 mg | ORAL_TABLET | Freq: Two times a day (BID) | ORAL | 1 refills | Status: DC
  Filled 2024-08-29: qty 180, 90d supply, fill #0

## 2024-08-30 ENCOUNTER — Other Ambulatory Visit: Payer: Self-pay

## 2024-08-31 ENCOUNTER — Ambulatory Visit: Admitting: Allergy & Immunology

## 2024-08-31 ENCOUNTER — Other Ambulatory Visit (HOSPITAL_COMMUNITY): Payer: Self-pay

## 2024-08-31 ENCOUNTER — Encounter: Payer: Self-pay | Admitting: Allergy & Immunology

## 2024-08-31 VITALS — BP 130/86 | HR 82 | Temp 97.9°F | Wt 150.6 lb

## 2024-08-31 DIAGNOSIS — J310 Chronic rhinitis: Secondary | ICD-10-CM | POA: Diagnosis not present

## 2024-08-31 DIAGNOSIS — J4540 Moderate persistent asthma, uncomplicated: Secondary | ICD-10-CM | POA: Diagnosis not present

## 2024-08-31 MED ORDER — CETIRIZINE HCL 10 MG PO TABS
10.0000 mg | ORAL_TABLET | Freq: Every evening | ORAL | 1 refills | Status: AC
  Filled 2024-08-31: qty 90, 90d supply, fill #0
  Filled 2024-10-09 – 2024-11-28 (×2): qty 90, 90d supply, fill #1

## 2024-08-31 MED ORDER — ZAFIRLUKAST 10 MG PO TABS
10.0000 mg | ORAL_TABLET | Freq: Two times a day (BID) | ORAL | 1 refills | Status: AC
  Filled 2024-10-09 – 2024-11-28 (×2): qty 180, 90d supply, fill #0

## 2024-08-31 MED ORDER — QVAR REDIHALER 80 MCG/ACT IN AERB
2.0000 | INHALATION_SPRAY | Freq: Two times a day (BID) | RESPIRATORY_TRACT | 5 refills | Status: AC
  Filled 2024-08-31: qty 10.6, 30d supply, fill #0

## 2024-08-31 NOTE — Progress Notes (Signed)
 FOLLOW UP  Date of Service/Encounter:  08/31/24   Assessment:   Moderate persistent asthma, uncomplicated - present before COVID19 infection, but worsening since the COVID19 infection with current nagging cough that is worse with physical activity   Inability to tolerate combination inhalers (worsening thrush/sore throat)   Chronic idiopathic urticaria - improved by not fully resolved on Xolair  every 28 days   Chronic rhinitis - seemingly improved on the Xolair  for her hives   Long COVID syndrome  Plan/Recommendations:   1. Post COVID syndrome with urticarial rash - Continue with Xolair  every four weeks. - Continue with cetirizine  10mg  at night (you can add on a morning dose if you want to see if that helps).  - We could consider Rhapsido if needed (oral medication for hives, handout provided).   2. Moderate persistent asthma, uncomplicated - Lung testing looks great today.  - Daily controller medication(s): Qvar  Redihaler 2 puffs twice daily and zafirlukast  twice daily - Prior to physical activity: albuterol  2 puffs 10-15 minutes before physical activity. - Rescue medications: albuterol  4 puffs every 4-6 hours as needed - Changes during respiratory infections or worsening symptoms: Increase Qvar  4 puffs twice daily for TWO WEEKS. - Asthma control goals:  * Full participation in all desired activities (may need albuterol  before activity) * Albuterol  use two time or less a week on average (not counting use with activity) * Cough interfering with sleep two time or less a month * Oral steroids no more than once a year * No hospitalizations  3. Return in about 6 months (around 02/28/2025). You can have the follow up appointment with Dr. Iva or a Nurse Practicioner (our Nurse Practitioners are excellent and always have Physician oversight!).   Subjective:   KAYLENA PACIFICO is a 68 y.o. female presenting today for follow up of  Chief Complaint  Patient presents with    Follow-up    Pt is here for her f/u appt today. Pt states she has been doing well overall, Xolair  helps a lot.     IMANI FIEBELKORN has a history of the following: Patient Active Problem List   Diagnosis Date Noted   Chronic constipation 04/04/2024   Screening for colon cancer 11/15/2023   Polyp of descending colon 11/15/2023   Polyp of ascending colon 11/15/2023   Polyp of rectum 11/15/2023   Dry eyes 05/12/2022   Asthma 05/12/2022   Fibromyalgia 05/12/2022   Hyperlipidemia 05/12/2022   Osteoporosis 03/13/2021   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 hauler 12/25/2020   Chronic urticaria 12/25/2020   Moderate persistent asthma, uncomplicated 12/25/2020   Ageusia 08/12/2020   Anosmia 08/12/2020   Myalgia, epidemic 08/12/2020   Cough with exposure to COVID-19 virus 08/12/2020   Olfactory impairment 05/29/2020   Left ureteral stone 04/23/2020   Intermittent chest pain 02/09/2020   History of COVID-19 02/09/2020   Arthralgia 02/09/2020   Benign essential hypertension 10/25/2019   Gastroesophageal reflux disease without esophagitis 06/23/2019   Chronic migraine 06/23/2019   Neuropathy 11/17/2018   Atypical facial pain 06/30/2018   Hypothyroidism 05/19/2018   Postoperative history of checked last year 04/22/2017   Weakness of left arm 04/22/2017   Numbness and tingling in left arm 04/22/2017   Lower back pain 04/02/2016   Acute stress disorder 02/25/2015   Anxiety 02/25/2015   Airway hyperreactivity 02/25/2015   Bradycardia 02/25/2015   Hypersomnia 02/25/2015   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  Discussed the use of AI scribe software  for clinical note transcription with the patient and/or guardian, who gave verbal consent to proceed.  Rhona is a 68 y.o. female presenting for a follow up visit.  She was last seen in April 2025 by Dr. Tobie.  At that time, she was having some outbreaks, but continue with Xolair  every 4 weeks.  She also continue with Zyrtec  10 mg daily, increasing to 20 mg twice daily if needed.  Her asthma is under good control with Flovent  2 puffs twice daily as well as a fairly past 10 mg twice daily.  She also remained on albuterol  as needed.  Since last visit, she has mostly done well.  Asthma/Respiratory Symptom History: She stopped the Flovent  because Medicare would not pay  for it. She is now on Qvar  two puffs twice daily. This is somewhat affordable. She had been using Flovent  inconsistently due to insurance issues but has switched to Qpharm, which is more affordable. Initially, she paid $125 a month for her medications but met her deductible by March, reducing her costs significantly. Her deductible for the coming year will be $2,100. She uses Qvar  80 mcg, which she recently received in the mail.  Allergic Rhinitis Symptom History: She is currently taking Zyrtec , which she has been on for over ten years, and finds it effective. She also takes zafirlukast , which she has been on for a long time after trying Singulair  and Accolade without success.   Skin Symptom History: She is on Xolair  for chronic urticaria, initially paying $856 before it was approved. She starts feeling itchy about a week before her next dose is due, particularly in the creases of her arms. She also has a history of asthma and feels that Xolair  has helped reduce her cough and reactivity to allergens.  She discusses her travel to [place], noting that she usually stays for two to three weeks and does not purchase special healthcare insurance for her trips. She has two granddaughters in [place] and mentions that 75% of her family lives  there.   Otherwise, there have been no changes to her past medical history, surgical history, family history, or social history.    Review of systems otherwise negative other than that mentioned in the HPI.    Objective:   Blood pressure 130/86, pulse 82, temperature 97.9 F (36.6 C), temperature source Temporal, weight 150 lb 9.6 oz (68.3 kg), SpO2 97%. Body mass index is 25.06 kg/m.    Physical Exam Vitals reviewed.  Constitutional:      Appearance: She is well-developed.     Comments: Talkative. Pleasant.   HENT:     Head: Normocephalic and atraumatic.     Right Ear: Tympanic membrane, ear canal and external ear normal.     Left Ear: Tympanic membrane, ear canal and external ear normal.     Nose: No nasal deformity, septal deviation, mucosal edema or rhinorrhea.     Right Turbinates: Enlarged, swollen and pale.     Left Turbinates: Enlarged, swollen and pale.     Right Sinus: No maxillary sinus tenderness or frontal sinus tenderness.     Left Sinus: No maxillary sinus tenderness or frontal sinus tenderness.  Comments: No nasal polyps noted.     Mouth/Throat:     Lips: Pink.     Mouth: Mucous membranes are moist. Mucous membranes are not pale and not dry.     Pharynx: Uvula midline.     Comments: Cobblestoning in the posterior oropharynx.  Eyes:     General: Lids are normal. Allergic shiner present.        Right eye: No discharge.        Left eye: No discharge.     Conjunctiva/sclera: Conjunctivae normal.     Right eye: Right conjunctiva is not injected. No chemosis.    Left eye: Left conjunctiva is not injected. No chemosis.    Pupils: Pupils are equal, round, and reactive to light.  Cardiovascular:     Rate and Rhythm: Normal rate and regular rhythm.     Heart sounds: Normal heart sounds.  Pulmonary:     Effort: Pulmonary effort is normal. No tachypnea, accessory muscle usage or respiratory distress.     Breath sounds: Normal breath sounds. No wheezing,  rhonchi or rales.     Comments: Moving air well in all lung fields. No increased work of breathing noted.  Chest:     Chest wall: No tenderness.  Lymphadenopathy:     Cervical: No cervical adenopathy.  Skin:    General: Skin is warm.     Capillary Refill: Capillary refill takes less than 2 seconds.     Coloration: Skin is not pale.     Findings: No abrasion, erythema, petechiae or rash. Rash is not papular, urticarial or vesicular.     Comments: No urticaria noted.   Neurological:     Mental Status: She is alert.  Psychiatric:        Behavior: Behavior is cooperative.      Diagnostic studies:   Spirometry: results normal (FEV1: 1.69/73%, FVC: 2.60/87%, FEV1/FVC: 65%).    Spirometry consistent with normal pattern.   Allergy  Studies: none       Marty Shaggy, MD  [facility] 

## 2024-08-31 NOTE — Patient Instructions (Addendum)
 1. Post COVID syndrome with urticarial rash - Continue with Xolair  every four weeks. - Continue with cetirizine  10mg  at night (you can add on a morning dose if you want to see if that helps).  - We could consider Rhapsido if needed (oral medication for hives, handout provided).   2. Moderate persistent asthma, uncomplicated - Lung testing looks great today.  - Daily controller medication(s): Qvar  Redihaler 2 puffs twice daily and zafirlukast  twice daily - Prior to physical activity: albuterol  2 puffs 10-15 minutes before physical activity. - Rescue medications: albuterol  4 puffs every 4-6 hours as needed - Changes during respiratory infections or worsening symptoms: Increase Qvar  4 puffs twice daily for TWO WEEKS. - Asthma control goals:  * Full participation in all desired activities (may need albuterol  before activity) * Albuterol  use two time or less a week on average (not counting use with activity) * Cough interfering with sleep two time or less a month * Oral steroids no more than once a year * No hospitalizations  3. Return in about 6 months (around 02/28/2025). You can have the follow up appointment with Dr. Iva or a Nurse Practicioner (our Nurse Practitioners are excellent and always have Physician oversight!).    Please inform us  of any Emergency Department visits, hospitalizations, or changes in symptoms. Call us  before going to the ED for breathing or allergy  symptoms since we might be able to fit you in for a sick visit. Feel free to contact us  anytime with any questions, problems, or concerns.  It was a pleasure to see you again today!  Websites that have reliable patient information: 1. American Academy of Asthma, Allergy , and Immunology: www.aaaai.org 2. Food Allergy  Research and Education (FARE): foodallergy.org 3. Mothers of Asthmatics: http://www.asthmacommunitynetwork.org 4. American College of Allergy , Asthma, and Immunology: www.acaai.org      "Like"  us  on Facebook and Instagram for our latest updates!      A healthy democracy works best when [employer] participate! Make sure you are registered to vote! If you have moved or changed any of your contact information, you will need to get this updated before voting! Scan the QR codes below to learn more!

## 2024-09-01 ENCOUNTER — Other Ambulatory Visit (HOSPITAL_BASED_OUTPATIENT_CLINIC_OR_DEPARTMENT_OTHER): Payer: Self-pay

## 2024-09-01 ENCOUNTER — Other Ambulatory Visit (HOSPITAL_COMMUNITY): Payer: Self-pay

## 2024-09-01 ENCOUNTER — Other Ambulatory Visit: Payer: Self-pay

## 2024-09-04 ENCOUNTER — Other Ambulatory Visit (HOSPITAL_COMMUNITY): Payer: Self-pay

## 2024-09-05 ENCOUNTER — Encounter: Payer: Self-pay | Admitting: Allergy & Immunology

## 2024-09-06 ENCOUNTER — Ambulatory Visit
Admission: RE | Admit: 2024-09-06 | Discharge: 2024-09-06 | Disposition: A | Discharge status: Home / Self Care | Source: Ambulatory Visit | Attending: Family Medicine | Admitting: Family Medicine

## 2024-09-06 DIAGNOSIS — Z1231 Encounter for screening mammogram for malignant neoplasm of breast: Secondary | ICD-10-CM | POA: Diagnosis not present

## 2024-09-12 DIAGNOSIS — M79672 Pain in left foot: Secondary | ICD-10-CM | POA: Diagnosis not present

## 2024-09-12 DIAGNOSIS — S92515D Nondisplaced fracture of proximal phalanx of left lesser toe(s), subsequent encounter for fracture with routine healing: Secondary | ICD-10-CM | POA: Diagnosis not present

## 2024-09-14 ENCOUNTER — Other Ambulatory Visit (HOSPITAL_COMMUNITY): Payer: Self-pay

## 2024-09-15 ENCOUNTER — Other Ambulatory Visit: Payer: Self-pay

## 2024-09-15 ENCOUNTER — Ambulatory Visit
Admission: RE | Admit: 2024-09-15 | Discharge: 2024-09-15 | Disposition: A | Discharge status: Home / Self Care | Source: Ambulatory Visit | Attending: Psychiatry | Admitting: Psychiatry

## 2024-09-15 DIAGNOSIS — G43909 Migraine, unspecified, not intractable, without status migrainosus: Secondary | ICD-10-CM | POA: Diagnosis present

## 2024-09-15 DIAGNOSIS — G43709 Chronic migraine without aura, not intractable, without status migrainosus: Secondary | ICD-10-CM | POA: Diagnosis not present

## 2024-09-15 MED ORDER — SODIUM CHLORIDE 0.9 % IV SOLN
300.0000 mg | Freq: Once | INTRAVENOUS | Status: AC
  Administered 2024-09-15: 300 mg via INTRAVENOUS
  Filled 2024-09-15: qty 3

## 2024-09-18 ENCOUNTER — Other Ambulatory Visit: Payer: Self-pay

## 2024-09-19 ENCOUNTER — Other Ambulatory Visit: Payer: Self-pay

## 2024-09-19 NOTE — Addendum Note (Signed)
 Addended by: AZALEA, Shemekia Patane on: 09/19/2024 05:13 PM   Modules accepted: Orders

## 2024-09-20 ENCOUNTER — Other Ambulatory Visit: Payer: Self-pay

## 2024-09-20 NOTE — Progress Notes (Signed)
 Specialty Pharmacy Refill Coordination Note  Rachel Fry is a 68 y.o. female contacted today regarding refills of specialty medication(s) Omalizumab  (Xolair )   Patient requested Delivery   Delivery date: 09/27/24   Verified address: [address] [place]  [address]   Medication will be filled on: 09/26/24

## 2024-09-26 ENCOUNTER — Other Ambulatory Visit: Payer: Self-pay

## 2024-09-29 ENCOUNTER — Ambulatory Visit: Admitting: Family Medicine

## 2024-10-06 ENCOUNTER — Ambulatory Visit (INDEPENDENT_AMBULATORY_CARE_PROVIDER_SITE_OTHER): Admitting: Family Medicine

## 2024-10-06 ENCOUNTER — Encounter: Payer: Self-pay | Admitting: Family Medicine

## 2024-10-06 VITALS — BP 133/86 | HR 71 | Ht 65.0 in | Wt 157.9 lb

## 2024-10-06 DIAGNOSIS — R7303 Prediabetes: Secondary | ICD-10-CM | POA: Diagnosis not present

## 2024-10-06 DIAGNOSIS — M8000XP Age-related osteoporosis with current pathological fracture, unspecified site, subsequent encounter for fracture with malunion: Secondary | ICD-10-CM

## 2024-10-06 DIAGNOSIS — E78019 Familial hypercholesterolemia, unspecified: Secondary | ICD-10-CM | POA: Diagnosis not present

## 2024-10-06 DIAGNOSIS — I10 Essential (primary) hypertension: Secondary | ICD-10-CM

## 2024-10-06 NOTE — Progress Notes (Signed)
 "     Established patient visit   Patient: Rachel Fry   DOB: 1955-11-05   68 y.o. Female  MRN: [id number] Visit Date: 10/06/2024  Today's healthcare provider: Nancyann Perry, MD   Chief Complaint  Patient presents with   Medical Management of Chronic Issues    HTN follow-up   Subjective    Discussed the use of AI scribe software for clinical note transcription with the patient, who gave verbal consent to proceed.  History of Present Illness   Rachel Fry is a 68 year old female patient of Dr. Donzella with hypertension and hypercalcemia who presents for a follow up.  She has experienced fluctuating blood pressure readings at home, ranging from the 120s to 150s systolic and 70s to 80s diastolic. Inconsistent monitoring has been due to battery issues with her home device. She has had adverse reactions to several antihypertensive medications, including lisinopril, which caused lip and tongue swelling, and beta blockers, which led to a heart rate drop and an accident. Currently, she takes clonidine  0.1 mg twice daily, spironolactone , and torsemide  as needed, noting a decrease in torsemide  use since retiring and losing weight.  She has a history of fluctuating blood sugar levels, with her A1c previously in the prediabetic range. Her last A1c check was in July.  She takes cholesterol medication, including Zetia  and atorvastatin , and reports no issues with these medications. Her cholesterol levels were checked recently and were satisfactory.  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 She has undergone extensive testing, including a VO2 treadmill test, with no consistent findings. These symptoms are not constant but can occur intermittently.  She has been on Reclast  for osteoporosis for three years. Her DEXA scan showed improvement, although she recently broke a toe. Her calcium  levels have been monitored due to previous  high readings, which influenced the choice of Reclast  over other osteoporosis medications.  She has a history of asthma and received a pneumonia vaccine in her 80s. She has not received the newer Prevnar vaccine.  She is retired and has lost six to seven pounds since retirement. She recently traveled to [place], [place], to visit her daughters and grandchildren for three weeks.     Lab Results  Component Value Date   HGBA1C 6.1 (H) 04/25/2024   HGBA1C 5.4 12/10/2016   HGBA1C 6.0 (H) 05/03/2015   Lab Results  Component Value Date   NA 141 04/25/2024   CL 103 04/25/2024   K 4.3 04/25/2024   CO2 22 04/25/2024   BUN 13 04/25/2024   CREATININE 1.09 (H) 04/25/2024   EGFR 55 (L) 04/25/2024   CALCIUM  10.4 (H) 04/25/2024   PHOS 3.3 04/04/2015   ALBUMIN 4.5 04/25/2024   GLUCOSE 100 (H) 04/25/2024     Lab Results  Component Value Date   VD25OH 65.4 04/25/2024   .  Medications: Show/hide medication list[1] Review of Systems  Constitutional:  Negative for appetite change, chills, fatigue and fever.  Respiratory:  Negative for chest tightness and shortness of breath.   Cardiovascular:  Negative for chest pain and palpitations.  Gastrointestinal:  Negative for abdominal pain, nausea and vomiting.  Neurological:  Negative for dizziness and weakness.       Objective    BP 133/86 (BP Location: Right Arm, Patient Position: Sitting, Cuff Size: Normal)   Pulse 71   Ht 5' 5 (1.651 m)   Wt 157 lb 14.4 oz (71.6 kg)   SpO2  98%   BMI 26.28 kg/m   Physical Exam   General appearance: Well developed, well nourished female, cooperative and in no acute distress Head: Normocephalic, without obvious abnormality, atraumatic Respiratory: Respirations even and unlabored, normal respiratory rate Extremities: All extremities are intact.  Skin: Skin color, texture, turgor normal. No rashes seen  Psych: Appropriate mood and affect. Neurologic: Mental status: Alert, oriented to person,  place, and time, thought content appropriate.     Assessment & Plan        Hypertension Fluctuating blood pressure with limited medication options due to adverse reactions. Current regimen includes clonidine , spironolactone , and torsemide  as needed. - Continue clonidine , spironolactone , and torsemide  as needed. - Ordered blood work for hemoglobin A1c, calcium , and electrolytes.  Prediabetes Hemoglobin A1c in prediabetic range. - Ordered blood work for hemoglobin A1c.  Familial hypercholesterolemia Cholesterol levels well-managed with Zetia  and atorvastatin . - Continue current cholesterol management regimen.  Osteoporosis Improvement on Reclast  with elevated calcium  levels previously noted. - Continue Reclast  for one more year. - Ordered blood work for calcium  levels.  General Health Maintenance Due for Prevnar vaccine. - Recommended Prevnar vaccine.  Which she declined today.         Nancyann Perry, MD  [facility] [phone] (phone) [phone] (fax)  [facility]    [1]  Outpatient Medications Prior to Visit  Medication Sig   albuterol  (VENTOLIN  HFA) 108 (90 Base) MCG/ACT inhaler Inhale 2 puffs into the lungs every 4 (four) hours as needed for wheezing or shortness of breath.   atorvastatin  (LIPITOR) 20 MG tablet Take 1 tablet (20 mg total) by mouth daily.   beclomethasone (QVAR  REDIHALER) 80 MCG/ACT inhaler Inhale 2 puffs into the lungs 2 (two) times daily.   cetirizine  (ZYRTEC ) 10 MG tablet Take 1 tablet (10 mg total) by mouth every evening.   cloNIDine  (CATAPRES ) 0.1 MG tablet Take 1 tablet (0.1 mg total) by mouth 2 (two) times daily.   EPINEPHrine  0.3 mg/0.3 mL IJ SOAJ injection Inject 1 pen (0.3 mg) into the muscle as needed for anaphylaxis.   Eptinezumab -jjmr (VYEPTI ) 100 MG/ML injection Inject 300 mg into the vein every 3 (three) months.   ezetimibe  (ZETIA ) 10 MG tablet Take 1 tablet (10 mg total) by mouth daily.    famotidine (PEPCID) 20 MG tablet Take 20 mg by mouth 2 (two) times daily.   frovatriptan  (FROVA ) 2.5 MG tablet Take 1 tablet (2.5 mg total) by mouth daily as needed for migraine.   levothyroxine  (EUTHYROX ) 50 MCG tablet Take 1 tablet (50 mcg total) by mouth daily.   linaclotide  (LINZESS ) 145 MCG CAPS capsule Take 1 capsule (145 mcg total) by mouth daily.   meclizine  (ANTIVERT ) 25 MG tablet Take 1 tablet (25 mg total) by mouth 3 (three) times daily as needed.   omalizumab  (XOLAIR ) 300 MG/2  ML prefilled syringe Inject 300 mg into the skin every 28 (twenty-eight) days.   Polyethyl Glycol-Propyl Glycol (SYSTANE OP) Place 1-2 drops into both eyes 2 (two) times daily.   Rimegepant Sulfate  (NURTEC) 75 MG TBDP Dissolve 1 tablet by mouth daily as needed.   Spacer/Aero-Holding Raguel FRENCH Use as directed with inhaler   spironolactone  (ALDACTONE ) 50 MG tablet Take 1 tablet (50 mg total) by mouth 2 (two) times daily.   torsemide  (DEMADEX ) 20 MG tablet Take 2 tablets (40 mg total) by mouth daily as needed.   triamcinolone  (NASACORT ) 55 MCG/ACT AERO nasal inhaler Place 2 sprays into the nose at bedtime.  Vitamin D , Ergocalciferol , (DRISDOL ) 1.25 MG (50000 UNIT) CAPS capsule Take 1 capsule (50,000 Units total) by mouth once a week.   zafirlukast  (ACCOLATE ) 10 MG tablet Take 1 tablet (10 mg total) by mouth 2 times daily.   zoledronic  acid (RECLAST ) 5 MG/100ML SOLN injection Inject 5 mg into the vein as directed. Once a year.   No facility-administered medications prior to visit.   "

## 2024-10-08 LAB — HEMOGLOBIN A1C
Est. average glucose Bld gHb Est-mCnc: 131 mg/dL
Hgb A1c MFr Bld: 6.2 % — ABNORMAL HIGH (ref 4.8–5.6)

## 2024-10-08 LAB — RENAL FUNCTION PANEL
Albumin: 4.6 g/dL (ref 3.9–4.9)
BUN/Creatinine Ratio: 13 (ref 12–28)
BUN: 14 mg/dL (ref 8–27)
CO2: 24 mmol/L (ref 20–29)
Calcium: 10.6 mg/dL — ABNORMAL HIGH (ref 8.7–10.3)
Chloride: 100 mmol/L (ref 96–106)
Creatinine, Ser: 1.09 mg/dL — ABNORMAL HIGH (ref 0.57–1.00)
Glucose: 90 mg/dL (ref 70–99)
Phosphorus: 3.3 mg/dL (ref 3.0–4.3)
Potassium: 4.4 mmol/L (ref 3.5–5.2)
Sodium: 139 mmol/L (ref 134–144)
eGFR: 55 mL/min/1.73 — ABNORMAL LOW (ref >59)

## 2024-10-08 LAB — PARATHYROID HORMONE, INTACT (NO CA): PTH: 82 pg/mL — ABNORMAL HIGH (ref 15–65)

## 2024-10-09 ENCOUNTER — Other Ambulatory Visit: Payer: Self-pay

## 2024-10-09 ENCOUNTER — Ambulatory Visit: Payer: Self-pay | Admitting: Family Medicine

## 2024-10-09 ENCOUNTER — Other Ambulatory Visit (HOSPITAL_COMMUNITY): Payer: Self-pay

## 2024-10-10 ENCOUNTER — Other Ambulatory Visit: Payer: Self-pay

## 2024-10-10 MED ORDER — GABAPENTIN 100 MG PO CAPS
100.0000 mg | ORAL_CAPSULE | Freq: Three times a day (TID) | ORAL | 1 refills | Status: AC
  Filled 2024-10-11: qty 270, 90d supply, fill #0

## 2024-10-11 ENCOUNTER — Other Ambulatory Visit: Payer: Self-pay

## 2024-10-16 ENCOUNTER — Other Ambulatory Visit (HOSPITAL_COMMUNITY): Payer: Self-pay

## 2024-10-16 ENCOUNTER — Ambulatory Visit: Admitting: Family Medicine

## 2024-10-17 ENCOUNTER — Other Ambulatory Visit: Payer: Self-pay

## 2024-10-18 ENCOUNTER — Other Ambulatory Visit: Payer: Self-pay

## 2024-10-18 NOTE — Progress Notes (Signed)
 Specialty Pharmacy Refill Coordination Note  Rachel Fry is a 68 y.o. female contacted today regarding refills of specialty medication(s) Omalizumab  (Xolair )   Patient requested Delivery   Delivery date: 10/25/24   Verified address: [address]  [place] [address]   Medication will be filled on: 10/24/24

## 2024-10-24 ENCOUNTER — Other Ambulatory Visit: Payer: Self-pay

## 2024-11-05 ENCOUNTER — Other Ambulatory Visit (HOSPITAL_COMMUNITY): Payer: Self-pay

## 2024-11-06 ENCOUNTER — Other Ambulatory Visit: Payer: Self-pay

## 2024-11-06 ENCOUNTER — Other Ambulatory Visit (HOSPITAL_COMMUNITY): Payer: Self-pay

## 2024-11-07 ENCOUNTER — Other Ambulatory Visit: Payer: Self-pay

## 2024-11-07 ENCOUNTER — Other Ambulatory Visit (HOSPITAL_COMMUNITY): Payer: Self-pay

## 2024-11-08 ENCOUNTER — Other Ambulatory Visit: Payer: Self-pay | Admitting: Pharmacist

## 2024-11-08 ENCOUNTER — Other Ambulatory Visit: Payer: Self-pay

## 2024-11-08 NOTE — Progress Notes (Signed)
 Specialty Pharmacy Ongoing Clinical Assessment Note  Rachel Fry is a 70 y.o. female who is being followed by the specialty pharmacy service for RxSp Allergy    Patient's specialty medication(s) reviewed today: Omalizumab  (Xolair )   Missed doses in the last 4 weeks: 0   Patient/Caregiver did not have any additional questions or concerns.   Therapeutic benefit summary: Patient is achieving benefit   Adverse events/side effects summary: No adverse events/side effects   Patient's therapy is appropriate to: Continue    Goals Addressed             This Visit's Progress    Reduce signs and symptoms   On track     Patient was well controlled on every 2 week injections; however, insurance will not approve that frequency, her provider's office tried to do a PA, but it was denied.  She reports that she is well controlled until a week before her next dose.  Her provider is aware and monitoring. She has new insurance this year and plans to speak with her provider about possibly getting approved to do injections every 2 weeks. Patient will maintain adherence.         Follow up: 12 months  Lyle LELON Chalk Specialty Pharmacist

## 2024-11-08 NOTE — Progress Notes (Signed)
 Clinical Intervention Note  Clinical Intervention Notes: Patient has started taking Gabapentin  in the last month. No DDI's with Xolair .   Clinical Intervention Outcomes: Prevention of an adverse drug event   Lyle LELON Chalk Specialty Pharmacist

## 2024-11-09 ENCOUNTER — Other Ambulatory Visit (HOSPITAL_COMMUNITY): Payer: Self-pay

## 2024-11-10 ENCOUNTER — Other Ambulatory Visit: Payer: Self-pay

## 2024-11-10 ENCOUNTER — Other Ambulatory Visit (HOSPITAL_COMMUNITY): Payer: Self-pay

## 2024-11-10 MED ORDER — FROVATRIPTAN SUCCINATE 2.5 MG PO TABS
2.5000 mg | ORAL_TABLET | Freq: Every day | ORAL | 11 refills | Status: AC
  Filled 2024-11-10: qty 9, 9d supply, fill #0

## 2024-11-10 MED ORDER — NURTEC 75 MG PO TBDP
75.0000 mg | ORAL_TABLET | ORAL | 11 refills | Status: AC | PRN
  Filled 2024-11-10: qty 8, 30d supply, fill #0

## 2024-11-12 ENCOUNTER — Other Ambulatory Visit (HOSPITAL_COMMUNITY): Payer: Self-pay

## 2024-11-13 ENCOUNTER — Other Ambulatory Visit: Payer: Self-pay

## 2024-11-13 ENCOUNTER — Other Ambulatory Visit (HOSPITAL_COMMUNITY): Payer: Self-pay

## 2024-11-14 ENCOUNTER — Other Ambulatory Visit: Payer: Self-pay

## 2024-11-14 ENCOUNTER — Other Ambulatory Visit: Payer: Self-pay | Admitting: Pharmacy Technician

## 2024-11-14 NOTE — Progress Notes (Signed)
 Specialty Pharmacy Refill Coordination Note  Rachel Fry is a 69 y.o. female contacted today regarding refills of specialty medication(s) Omalizumab  (Xolair )   Patient requested Delivery   Delivery date: 11/16/24   Verified address: [address],  [place] [address]   Medication will be filled on: 11/15/24

## 2024-11-15 ENCOUNTER — Other Ambulatory Visit: Payer: Self-pay

## 2024-11-17 ENCOUNTER — Other Ambulatory Visit (HOSPITAL_BASED_OUTPATIENT_CLINIC_OR_DEPARTMENT_OTHER): Payer: Self-pay

## 2024-11-17 ENCOUNTER — Encounter: Payer: Self-pay | Admitting: Family Medicine

## 2024-11-17 ENCOUNTER — Ambulatory Visit: Admitting: Family Medicine

## 2024-11-17 ENCOUNTER — Other Ambulatory Visit (HOSPITAL_COMMUNITY): Payer: Self-pay

## 2024-11-17 VITALS — BP 144/79 | HR 73 | Temp 97.9°F | Ht 65.0 in | Wt 161.4 lb

## 2024-11-17 DIAGNOSIS — I10 Essential (primary) hypertension: Secondary | ICD-10-CM

## 2024-11-17 DIAGNOSIS — R7989 Other specified abnormal findings of blood chemistry: Secondary | ICD-10-CM

## 2024-11-17 DIAGNOSIS — E049 Nontoxic goiter, unspecified: Secondary | ICD-10-CM

## 2024-11-17 DIAGNOSIS — Z23 Encounter for immunization: Secondary | ICD-10-CM

## 2024-11-17 MED ORDER — CLONIDINE ER 0.17 MG PO TB24
1.0000 | ORAL_TABLET | Freq: Every day | ORAL | 1 refills | Status: AC
  Filled 2024-11-17 – 2024-11-28 (×3): qty 30, 30d supply, fill #0

## 2024-11-17 NOTE — Progress Notes (Unsigned)
 "     Established patient visit   Patient: Rachel Fry   DOB: [birth year]   69 y.o. Female  MRN: [id number] Visit Date: 11/17/2024  Today's healthcare provider: LAURAINE LOISE BUOY, DO   Chief Complaint  Patient presents with   Follow-up    Patient states that she is here today to follow up on her blood pressure, seen Dr. Gasper and he changed her medications and advised her to follow up with her primary in a month.  Tolerating the medication ok but hard to take it 3 times a day.  Blood pressure is 120s/70-80s.  Pneumococcal Vaccine- declined   Subjective    HPI    ***  {History (Optional):23778}  Medications: Show/hide medication list[1]  Review of Systems ***  {Insert previous labs (optional):23779} {See past labs  Heme  Chem  Endocrine  Serology  Results Review (optional):1}   Objective    BP (!) 144/79 (BP Location: Right Arm, Patient Position: Sitting, Cuff Size: Normal)   Pulse 73   Temp 97.9 F (36.6 C) (Oral)   Ht 5' 5 (1.651 m)   Wt 161 lb 6.4 oz (73.2 kg)   SpO2 98%   BMI 26.86 kg/m  {Insert last BP/Wt (optional):23777}{See vitals history (optional):1}   Physical Exam Vitals and nursing note reviewed.  Constitutional:      General: She is not in acute distress.    Appearance: Normal appearance.  HENT:     Head: Normocephalic and atraumatic.  Eyes:     General: No scleral icterus.    Conjunctiva/sclera: Conjunctivae normal.  Cardiovascular:     Rate and Rhythm: Normal rate.  Pulmonary:     Effort: Pulmonary effort is normal.  Neurological:     Mental Status: She is alert and oriented to person, place, and time. Mental status is at baseline.  Psychiatric:        Mood and Affect: Mood normal.        Behavior: Behavior normal.      No results found for any visits on 11/17/24.  Assessment & Plan    Need for vaccination against Streptococcus pneumoniae    ***  No follow-ups on file.      I discussed the assessment and treatment  plan with the patient  The patient was provided an opportunity to ask questions and all were answered. The patient agreed with the plan and demonstrated an understanding of the instructions.   The patient was advised to call back or seek an in-person evaluation if the symptoms worsen or if the condition fails to improve as anticipated.    LAURAINE LOISE BUOY, DO  [facility] [phone] (phone) [phone] (fax)  [facility]    [1]  Outpatient Medications Prior to Visit  Medication Sig   albuterol  (VENTOLIN  HFA) 108 (90 Base) MCG/ACT inhaler Inhale 2 puffs into the lungs every 4 (four) hours as needed for wheezing or shortness of breath.   atorvastatin  (LIPITOR) 20 MG tablet Take 1 tablet (20 mg total) by mouth daily.   beclomethasone (QVAR  REDIHALER) 80 MCG/ACT inhaler Inhale 2 puffs into the lungs 2 (two) times daily.   cetirizine  (ZYRTEC ) 10 MG tablet Take 1 tablet (10 mg total) by mouth every evening.   cloNIDine  (CATAPRES ) 0.1 MG tablet Take 1 tablet (0.1 mg total) by mouth 2 (two) times daily.   EPINEPHrine  0.3 mg/0.3 mL IJ SOAJ injection Inject 1 pen (0.3 mg) into the muscle as needed for anaphylaxis.  Eptinezumab -jjmr (VYEPTI ) 100 MG/ML injection Inject 300 mg into the vein every 3 (three) months.   ezetimibe  (ZETIA ) 10 MG tablet Take 1 tablet (10 mg total) by mouth daily.   famotidine (PEPCID) 20 MG tablet Take 20 mg by mouth 2 (two) times daily.   frovatriptan  (FROVA ) 2.5 MG tablet Take 1 tablet (2.5 mg total) by mouth daily as needed for migraine.   frovatriptan  (FROVA ) 2.5 MG tablet Take 1 tablet (2.5 mg total) by mouth once daily as needed for Migraine   gabapentin  (NEURONTIN ) 100 MG capsule Take 1 capsule (100 mg total) by mouth 3 (three) times daily.   levothyroxine  (EUTHYROX ) 50 MCG tablet Take 1 tablet (50 mcg total) by mouth daily.   linaclotide  (LINZESS ) 145 MCG CAPS capsule Take 1 capsule (145 mcg total) by mouth daily.    meclizine  (ANTIVERT ) 25 MG tablet Take 1 tablet (25 mg total) by mouth 3 (three) times daily as needed.   omalizumab  (XOLAIR ) 300 MG/2  ML prefilled syringe Inject 300 mg into the skin every 28 (twenty-eight) days.   Polyethyl Glycol-Propyl Glycol (SYSTANE OP) Place 1-2 drops into both eyes 2 (two) times daily.   Rimegepant Sulfate  (NURTEC) 75 MG TBDP Dissolve 1 tablet by mouth daily as needed.   Rimegepant Sulfate  (NURTEC) 75 MG TBDP Take 1 tablet (75 mg total) by mouth as directed (migraine) Max 1 tablet in 24 hours.   Spacer/Aero-Holding Raguel FRENCH Use as directed with inhaler   spironolactone  (ALDACTONE ) 50 MG tablet Take 1 tablet (50 mg total) by mouth 2 (two) times daily.   torsemide  (DEMADEX ) 20 MG tablet Take 2 tablets (40 mg total) by mouth daily as needed.   triamcinolone  (NASACORT ) 55 MCG/ACT AERO nasal inhaler Place 2 sprays into the nose at bedtime.   Vitamin D , Ergocalciferol , (DRISDOL ) 1.25 MG (50000 UNIT) CAPS capsule Take 1 capsule (50,000 Units total) by mouth once a week.   zafirlukast  (ACCOLATE ) 10 MG tablet Take 1 tablet (10 mg total) by mouth 2 times daily.   zoledronic  acid (RECLAST ) 5 MG/100ML SOLN injection Inject 5 mg into the vein as directed. Once a year.   No facility-administered medications prior to visit.   "

## 2024-11-20 ENCOUNTER — Telehealth (HOSPITAL_COMMUNITY): Payer: Self-pay

## 2024-11-20 ENCOUNTER — Other Ambulatory Visit (HOSPITAL_COMMUNITY): Payer: Self-pay

## 2024-11-21 ENCOUNTER — Ambulatory Visit
Admission: RE | Admit: 2024-11-21 | Discharge: 2024-11-21 | Disposition: A | Discharge status: Home / Self Care | Source: Ambulatory Visit | Attending: Family Medicine | Admitting: Family Medicine

## 2024-11-21 ENCOUNTER — Other Ambulatory Visit (HOSPITAL_COMMUNITY): Payer: Self-pay

## 2024-11-21 ENCOUNTER — Telehealth (HOSPITAL_COMMUNITY): Payer: Self-pay

## 2024-11-21 ENCOUNTER — Other Ambulatory Visit: Payer: Self-pay

## 2024-11-21 DIAGNOSIS — R7989 Other specified abnormal findings of blood chemistry: Secondary | ICD-10-CM | POA: Diagnosis present

## 2024-11-21 DIAGNOSIS — E049 Nontoxic goiter, unspecified: Secondary | ICD-10-CM | POA: Insufficient documentation

## 2024-11-21 NOTE — Telephone Encounter (Signed)
 Pharmacy Patient Advocate Encounter   Received notification from Pt Calls Messages that prior authorization for Clonidine  ER 0.17 mg tablets is required/requested.   Insurance verification completed.   The patient is insured through [facility] [employer].   Per test claim: Per test claim, medication is not covered due to plan/benefit exclusion, PA not submitted at this time

## 2024-11-21 NOTE — Telephone Encounter (Signed)
 PA request has been Received. New Encounter has been or will be created for follow up. For additional info see Pharmacy Prior Auth telephone encounter from 11/21/24.

## 2024-11-22 ENCOUNTER — Other Ambulatory Visit (HOSPITAL_COMMUNITY): Payer: Self-pay

## 2024-11-22 ENCOUNTER — Other Ambulatory Visit: Payer: Self-pay

## 2024-11-24 ENCOUNTER — Encounter: Payer: Self-pay | Admitting: Family Medicine

## 2024-11-28 ENCOUNTER — Other Ambulatory Visit: Payer: Self-pay

## 2024-11-28 ENCOUNTER — Other Ambulatory Visit (HOSPITAL_COMMUNITY): Payer: Self-pay

## 2024-11-29 ENCOUNTER — Other Ambulatory Visit: Payer: Self-pay

## 2024-11-29 ENCOUNTER — Ambulatory Visit

## 2024-11-29 DIAGNOSIS — M79672 Pain in left foot: Secondary | ICD-10-CM

## 2024-11-29 DIAGNOSIS — R262 Difficulty in walking, not elsewhere classified: Secondary | ICD-10-CM

## 2024-11-29 DIAGNOSIS — M5459 Other low back pain: Secondary | ICD-10-CM

## 2024-11-29 NOTE — Therapy (Signed)
 " OUTPATIENT PHYSICAL THERAPY THORACOLUMBAR EVALUATION   Patient Name: Rachel Fry MRN: [id number] DOB:June 17, 1956, 69 y.o., female Today's Date: 11/29/2024  END OF SESSION:  PT End of Session - 11/29/24 0905     Visit Number 1    Number of Visits 17    Date for Recertification  01/26/25    PT Start Time 0906    PT Stop Time 0953    PT Time Calculation (min) 47 min    Activity Tolerance Patient tolerated treatment well    Behavior During Therapy [facility] for tasks assessed/performed          Past Medical History:  Diagnosis Date   Allergy     Arthritis    Asthma    COVID-19    Depression 2007   Multiple family events in 6 months   GERD (gastroesophageal reflux disease) 1990   History of kidney stones    Hyperlipidemia    Hypertension    Hypothyroidism    Migraines    Numbness and tingling    left side of body, occasionally right side    S/P Botox  injection    Urticaria    Past Surgical History:  Procedure Laterality Date   ADENOIDECTOMY     ARM NEUROPLASTY Right 1994 and 1996   for chronic pain   CESAREAN SECTION     COLONOSCOPY WITH PROPOFOL  N/A 11/15/2023   Procedure: COLONOSCOPY WITH PROPOFOL ;  Surgeon: Unk Corinn Skiff, MD;  Location: [facility] END[facility]COPY;  Service: Gastroenterology;  Laterality: N/A;   CYSTOSCOPY W/ RETROGRADES Left 04/23/2020   Procedure: CYSTOSCOPY WITH RETROGRADE PYELOGRAM;  Surgeon: Twylla Glendia BROCKS, MD;  Location: [facility];  Service: Urology;  Laterality: Left;   CYSTOSCOPY W/ RETROGRADES Left 05/13/2020   Procedure: CYSTOSCOPY WITH RETROGRADE PYELOGRAM;  Surgeon: Penne Knee, MD;  Location: [facility];  Service: Urology;  Laterality: Left;   CYSTOSCOPY WITH STENT PLACEMENT Left 04/23/2020   Procedure: CYSTOSCOPY WITH STENT PLACEMENT;  Surgeon: Twylla Glendia BROCKS, MD;  Location: [facility];  Service: Urology;  Laterality: Left;   CYSTOSCOPY/URETEROSCOPY/HOLMIUM LASER/STENT PLACEMENT Left 05/13/2020   Procedure:  CYSTOSCOPY/URETEROSCOPY/LASER/STENT EXCHANGE;  Surgeon: Penne Knee, MD;  Location: [facility];  Service: Urology;  Laterality: Left;   FRACTURE SURGERY     OVARIAN CYST REMOVAL  1973   POLYPECTOMY  11/15/2023   Procedure: POLYPECTOMY;  Surgeon: Unk Corinn Skiff, MD;  Location: [facility] ENDOSCOPY;  Service: Gastroenterology;;   STONE EXTRACTION WITH BASKET  05/13/2020   Procedure: STONE EXTRACTION WITH BASKET;  Surgeon: Penne Knee, MD;  Location: [facility];  Service: Urology;;   TONSILLECTOMY     TONSILLECTOMY AND ADENOIDECTOMY     TUBAL LIGATION  1988   Patient Active Problem List   Diagnosis Date Noted   Chronic constipation 04/04/2024   Screening for colon cancer 11/15/2023   Polyp of descending colon 11/15/2023   Polyp of ascending colon 11/15/2023   Polyp of rectum 11/15/2023   Dry eyes 05/12/2022   Asthma 05/12/2022   Fibromyalgia 05/12/2022   Hyperlipidemia 05/12/2022   Osteoporosis 03/13/2021   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 hauler 12/25/2020   Chronic urticaria 12/25/2020   Moderate persistent asthma, uncomplicated 12/25/2020   Ageusia 08/12/2020   Anosmia 08/12/2020   Myalgia, epidemic 08/12/2020   Cough with exposure to COVID-19 virus 08/12/2020   Olfactory impairment 05/29/2020   Left ureteral stone 04/23/2020   Intermittent chest pain 02/09/2020   History of COVID-19 02/09/2020   Arthralgia 02/09/2020   Benign essential hypertension 10/25/2019   Gastroesophageal reflux disease without  esophagitis 06/23/2019   Chronic migraine 06/23/2019   Neuropathy 11/17/2018   Atypical facial pain 06/30/2018   Hypothyroidism 05/19/2018   Postoperative history of checked last year 04/22/2017   Weakness of left arm 04/22/2017   Numbness and tingling in left arm 04/22/2017   Lower back pain 04/02/2016   Acute stress disorder 02/25/2015   Anxiety 02/25/2015   Airway hyperreactivity 02/25/2015   Bradycardia 02/25/2015   Hypersomnia 02/25/2015   Clinical depression 02/25/2015    Elevated blood sugar 02/25/2015   Fibrositis 02/25/2015   Acid reflux 02/25/2015   Hepatitis non A non B 02/25/2015   H/O disease 02/25/2015   Hypercholesteremia 02/25/2015   Headache, migraine 02/25/2015   Muscle ache 02/25/2015   Allergic rhinitis, seasonal 02/25/2015   Vitamin D  deficiency 02/25/2015    PCP: Donzella Lauraine SAILOR, DO   REFERRING PROVIDER: Janeece Dede SAILOR, PA  REFERRING DIAG: M54.50 (ICD-10-CM) - Low back pain, unspecified  Rationale for Evaluation and Treatment: Rehabilitation  THERAPY DIAG:  Other low back pain - Plan: PT plan of care cert/re-cert  Pain in left foot - Plan: PT plan of care cert/re-cert  Difficulty in walking, not elsewhere classified - Plan: PT plan of care cert/re-cert  ONSET DATE: August 2025  SUBJECTIVE:                                                                                                                                                                                           SUBJECTIVE STATEMENT: R low back: 5/10 at worst for the past 3 months.   L foot (toe) 7/10 at most for the past 3 months  PERTINENT HISTORY:  Low back pain. Broke her middle toe (mid proximal phalanx to MTP joint) of her L foot August 2025. Pt was given a carbon fiber insert in her shoe and pt had to walk with that which affected her gait (could not bend her toes; had to walk flat footed) which put a lot of stress in R low back. 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 Still feels like her gait is off. Got off her carbon fiber toe insert 2 weeks ago. L toe still swells.   Back pain has gotten a little better since August 2025  No LE radiation  Has numbness L anterior thigh.    Trying to control BP.  Latex Allergies    PAIN:  Are you having pain? Yes: NPRS scale: 1/10 currently (pt sitting on a chair) Pain location: R low back  Pain description: throbbing, ache Aggravating factors: walking  Relieving factors: sitting  PAIN:  Are you having pain?  Yes: NPRS scale: 2/10 currenlty (  pt sitting, pain comes and goes) Pain location: L foot Pain description: ache Aggravating factors: walking, weather Relieving factors: sitting   PRECAUTIONS: None and Other: No known precautions  RED FLAGS: Bowel or bladder incontinence: No and Cauda equina syndrome: No   WEIGHT BEARING RESTRICTIONS: No  FALLS:  Has patient fallen in last 6 months? No  LIVING ENVIRONMENT: Lives with: lives with their spouse Lives in: House/apartment Stairs: Yes: External: 5 steps; can reach both Has following equipment at home: Grab bars and Ramped entry  OCCUPATION: retired but works per diem  PLOF: Independent  PATIENT GOALS: Pt expresses desire to decrease back pain  NEXT MD VISIT: None at the moment.   OBJECTIVE:  Note: Objective measures were completed at Evaluation unless otherwise noted.  DIAGNOSTIC FINDINGS:  MR FOOT LEFT WO CONTRAST 08/10/2025 Narrative & Impression  CLINICAL DATA:  Left foot pain since August 2025   EXAM: MRI OF THE LEFT FOOT WITHOUT CONTRAST   TECHNIQUE: Multiplanar, multisequence MR imaging of the left forefoot was performed. No intravenous contrast was administered.   COMPARISON:  None Available.   FINDINGS: Bones/Joint/Cartilage   Acute or subacute nondisplaced spiral fracture of the third toe proximal phalanx with intra-articular extension to the third MTP joint (series 6, images 6-8). Intense bone marrow edema throughout the proximal phalanx. No additional fractures. No additional sites of bone marrow edema. No malalignment. Mild first MTP joint osteoarthritis. No erosions.   Ligaments   Intact Lisfranc ligament.  Intact collateral ligaments.   Muscles and Tendons   Normal muscle bulk and signal intensity without edema, atrophy, or fatty infiltration. Intact flexor and extensor tendons without tendinosis, tear, or tenosynovitis.   Soft tissues   No soft tissue swelling or fluid collections.    IMPRESSION: 1. Acute or subacute nondisplaced spiral fracture of the third toe proximal phalanx with intra-articular extension to the third MTP joint. 2. Mild first MTP joint osteoarthritis.  A     Electronically Signed   By: Mabel Converse D.O.   On: 08/12/2024 13:53   DG Lumbar Spine Complete 03/13/2025 Narrative & Impression  CLINICAL DATA:  Low back pain. Chronic right-sided low back pain without sciatica.   EXAM: LUMBAR SPINE - COMPLETE 4+ VIEW   COMPARISON:  Lumbar MRI 07/02/2021   FINDINGS: Five non-rib-bearing lumbar vertebra. Faint dextroscoliotic curvature. No listhesis. Normal vertebral body heights. No fracture or compression deformity. Anterior spurring L2-L3. The disc spaces are preserved. There is mild lower lumbar facet hypertrophy. No visible pars defects or focal bone abnormality. Sacroiliac joints are congruent.   IMPRESSION: 1. Mild lower lumbar facet hypertrophy. 2. Minor dextroscoliotic curvature.     Electronically Signed   By: Andrea Gasman M.D.   On: 03/19/2024 18:14         PATIENT SURVEYS:  Modified Oswestry:  MODIFIED OSWESTRY DISABILITY SCALE  Date: 11/29/2024 Score  Pain intensity 1 = The pain is bad, but I can manage without having to take pain medication  2. Personal care (washing, dressing, etc.) 0 =  I can take care of myself normally without causing increased pain.  3. Lifting 4 = I can lift only very light weights (pt states limited by arms, not pain)  4. Walking 0 = Pain does not prevent me from walking any distance  5. Sitting 1 =  I can only sit in my favorite chair as long as I like.  6. Standing 2 =  Pain prevents me from standing more than 1 hour  7.  Sleeping 1 = I can sleep well only by using pain medication.  8. Social Life 0 = My social life is normal and does not increase my pain.  9. Traveling 0 =  I can travel anywhere without increased pain.  10. Employment/ Homemaking 1 = My normal homemaking/job activities  increase my pain, but I can still perform all that is required of me  Total 10/50 (20%)   Interpretation of scores: Score Category Description  0-20% Minimal Disability The patient can cope with most living activities. Usually no treatment is indicated apart from advice on lifting, sitting and exercise  21-40% Moderate Disability The patient experiences more pain and difficulty with sitting, lifting and standing. Travel and social life are more difficult and they may be disabled from work. Personal care, sexual activity and sleeping are not grossly affected, and the patient can usually be managed by conservative means  41-60% Severe Disability Pain remains the main problem in this group, but activities of daily living are affected. These patients require a detailed investigation  61-80% Crippled Back pain impinges on all aspects of the patients life. Positive intervention is required  81-100% Bed-bound These patients are either bed-bound or exaggerating their symptoms  Bluford FORBES Zoe DELENA Karon DELENA, et al. Surgery versus conservative management of stable thoracolumbar fracture: the PRESTO feasibility RCT. [place] ([place]): [employer]; 2021 Nov. [facility] Technology Assessment, No. 25.62.) Appendix 3, Oswestry Disability Index category descriptors. Available from: Findjewelers.cz  Minimally Clinically Important Difference (MCID) = 12.8%  COGNITION: Overall cognitive status: Within functional limits for tasks assessed     SENSATION:   MUSCLE LENGTH:   POSTURE: forward neck, thoracic kyphosis, B protracted shoulders, L > R; R lumbar convexity, movement preference around L3/4 and L1/2 areas, B foot pronation L > R  PALPATION: TTP R low back > L Decreased central PA mobility to mid thoracic spine.   LUMBAR ROM:   AROM eval  Flexion WFL with R low back pull (worse than back extension)  Extension WFL with R low back pain  Right lateral flexion WFL  with R low back pain  Left lateral flexion WFL with L low back compression feeling  Right rotation WFL with R upper back pain (scapular area)  Left rotation WFL with L rib area pain.    (Blank rows = not tested)  LOWER EXTREMITY ROM:     Passive  Right eval Left eval  Hip flexion    Hip extension    Hip abduction    Hip adduction    Hip internal rotation 22, stiff end feel 30  Hip external rotation    Knee flexion    Knee extension    Ankle dorsiflexion    Ankle plantarflexion    Ankle inversion    Ankle eversion     (Blank rows = not tested)  LOWER EXTREMITY MMT:    MMT Right eval Left eval  Hip flexion 4- 4-  Hip extension 4- 4-  Hip abduction 4- 4  Hip adduction    Hip internal rotation    Hip external rotation 4- 4-  Knee flexion 5 4+  Knee extension 5 5  Ankle dorsiflexion    Ankle plantarflexion    Ankle inversion    Ankle eversion     (Blank rows = not tested)  LUMBAR SPECIAL TESTS:    FUNCTIONAL TESTS:    GAIT: Distance walked: 30 ft Assistive device utilized: None Level of assistance: Complete Independence Comments: antalgic, decreased stance R  LE (initially, then decreased stance L LE with decreased push off), L pelvic drop during R LE stance with R lumbar side bend.   Stairs: decreased B femoral control, B genu valgus  TREATMENT DATE: 11/29/2024                                                                                                                               Blood pressure L arm sitting, mechanically taken, normal cuff:  134/60, HR 68     PATIENT EDUCATION:  Education details: POC Person educated: Patient Education method: Explanation Education comprehension: verbalized understanding  HOME EXERCISE PROGRAM:   ASSESSMENT:  CLINICAL IMPRESSION: Patient is a 69 y.o. female who was seen today for physical therapy evaluation and treatment for low back pain. 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 Pt will benefit from skilled physical therapy services to address the aforementioned deficits.   OBJECTIVE IMPAIRMENTS: Abnormal gait, difficulty walking, improper body mechanics, postural dysfunction, and pain.   ACTIVITY LIMITATIONS: lifting, bending, standing, stairs, reach over head, and locomotion level  PARTICIPATION LIMITATIONS:   PERSONAL FACTORS: Age, Fitness, Past/current experiences, Time since onset of injury/illness/exacerbation, and 3+ comorbidities: depression, HTN, migraines are also affecting patient's functional outcome.   REHAB POTENTIAL: Fair    CLINICAL DECISION MAKING: Stable/uncomplicated  EVALUATION COMPLEXITY: Low   GOALS: Goals reviewed with patient? Yes  SHORT TERM GOALS: Target date: 12/15/2024  Pt will be independent with her initial HEP to decrease pain, improve strength and function.  Baseline: Pt has not yet started her initial HEP (11/29/2024) Goal status: INITIAL   LONG TERM GOALS: Target date: 01/26/2025  Pt will have a decrease in low back pain to 1/10 or less to promote ability to ambulate more comfortably for her back and L foot.  Baseline: 5/10 low back pain at worst for the past 3 months (11/29/2024) Goal status: INITIAL  2.  Pt will have a decrease in L foot pain to 3/10 or less at worst to promote ability to ambulate more comfortably for her back (decrease compensation with gait). Baseline: L foot (toe) 7/10 at most for the past 3 months (11/29/2024) Goal status: INITIAL  3.  Pt will improve her B hip extension, abduction, and ER strength by at least 1/2 MMT grade to promote ability to ambulation, perform standing tasks with less  back and L foot pain.  Baseline:  MMT Right eval Left eval  Hip extension 4- 4-  Hip abduction 4- 4  Hip external rotation 4- 4-   Goal status: INITIAL  4.  Pt will improve her Modified Oswestry Low Back Pain Disability Questionnaire by  at least 10% as a demonstration of improved function.  Baseline: 20% (11/29/2024) Goal status: INITIAL   PLAN:  PT FREQUENCY: 1-2x/week  PT DURATION: 8 weeks  PLANNED INTERVENTIONS: 97110-Therapeutic exercises, 97530- Therapeutic activity, 97112- Neuromuscular re-education, 97535- Self Care, [id number]- Manual therapy, [phone]- Gait training, [phone]- Aquatic Therapy, [phone]- Electrical stimulation (unattended), [phone]- Traction (mechanical), [id number]- Ionotophoresis 4mg /ml Dexamethasone , 79439 (1-2 muscles), [id number] (3+ muscles)- Dry Needling, Patient/Family education, Stair training, Joint mobilization, and Spinal mobilization.  PLAN FOR NEXT SESSION: posture, thoracic extension, trunk and hip strengthening, improve hip ROM, femoral control, manual techniques, modalities PRN   Layce Sprung, PT, DPT 11/29/2024, 1:14 PM  "

## 2024-12-05 ENCOUNTER — Ambulatory Visit

## 2024-12-06 ENCOUNTER — Ambulatory Visit

## 2024-12-12 ENCOUNTER — Ambulatory Visit

## 2024-12-13 ENCOUNTER — Ambulatory Visit

## 2024-12-14 ENCOUNTER — Ambulatory Visit

## 2024-12-15 ENCOUNTER — Ambulatory Visit

## 2024-12-18 ENCOUNTER — Ambulatory Visit

## 2024-12-20 ENCOUNTER — Ambulatory Visit

## 2024-12-22 ENCOUNTER — Ambulatory Visit: Admitting: Family Medicine

## 2024-12-25 ENCOUNTER — Ambulatory Visit

## 2024-12-27 ENCOUNTER — Ambulatory Visit

## 2025-01-03 ENCOUNTER — Ambulatory Visit

## 2025-01-05 ENCOUNTER — Ambulatory Visit

## 2025-01-10 ENCOUNTER — Ambulatory Visit

## 2025-01-12 ENCOUNTER — Ambulatory Visit

## 2025-02-27 ENCOUNTER — Ambulatory Visit: Admitting: Allergy & Immunology

## 2025-04-17 ENCOUNTER — Encounter
